# Patient Record
Sex: Male | Born: 1945
Health system: Southern US, Community
[De-identification: ages and names within clinical notes are randomized; demographics above are authoritative.]

## PROBLEM LIST (undated history)

## (undated) DIAGNOSIS — I4892 Unspecified atrial flutter: Secondary | ICD-10-CM

## (undated) DIAGNOSIS — I480 Paroxysmal atrial fibrillation: Secondary | ICD-10-CM

## (undated) DIAGNOSIS — I2510 Atherosclerotic heart disease of native coronary artery without angina pectoris: Secondary | ICD-10-CM

## (undated) DIAGNOSIS — I639 Cerebral infarction, unspecified: Secondary | ICD-10-CM

## (undated) DIAGNOSIS — R011 Cardiac murmur, unspecified: Secondary | ICD-10-CM

## (undated) DIAGNOSIS — Z87442 Personal history of urinary calculi: Secondary | ICD-10-CM

## (undated) DIAGNOSIS — N200 Calculus of kidney: Secondary | ICD-10-CM

## (undated) DIAGNOSIS — I10 Essential (primary) hypertension: Secondary | ICD-10-CM

## (undated) DIAGNOSIS — E785 Hyperlipidemia, unspecified: Secondary | ICD-10-CM

## (undated) DIAGNOSIS — C61 Malignant neoplasm of prostate: Secondary | ICD-10-CM

## (undated) DIAGNOSIS — I499 Cardiac arrhythmia, unspecified: Secondary | ICD-10-CM

## (undated) DIAGNOSIS — M1990 Unspecified osteoarthritis, unspecified site: Secondary | ICD-10-CM

## (undated) HISTORY — PX: COLONOSCOPY: SHX174

## (undated) HISTORY — DX: Unspecified atrial flutter: I48.92

## (undated) HISTORY — DX: Hyperlipidemia, unspecified: E78.5

## (undated) HISTORY — DX: Cerebral infarction, unspecified: I63.9

## (undated) HISTORY — DX: Calculus of kidney: N20.0

## (undated) HISTORY — PX: CATARACT EXTRACTION W/ INTRAOCULAR LENS IMPLANT: SHX1309

## (undated) HISTORY — PX: APPENDECTOMY: SHX54

## (undated) HISTORY — PX: BACK SURGERY: SHX140

## (undated) HISTORY — PX: CERVICAL SPINE SURGERY: SHX589

## (undated) HISTORY — PX: LUMBAR SPINE SURGERY: SHX701

## (undated) HISTORY — PX: EYE SURGERY: SHX253

## (undated) HISTORY — DX: Paroxysmal atrial fibrillation: I48.0

## (undated) HISTORY — DX: Essential (primary) hypertension: I10

## (undated) HISTORY — DX: Malignant neoplasm of prostate: C61

---

## 1965-04-08 HISTORY — PX: APPENDECTOMY: SHX54

## 2000-09-25 ENCOUNTER — Inpatient Hospital Stay (HOSPITAL_COMMUNITY): Admission: RE | Admit: 2000-09-25 | Discharge: 2000-09-26 | Payer: Self-pay | Admitting: Neurological Surgery

## 2000-09-25 ENCOUNTER — Encounter: Payer: Self-pay | Admitting: Neurological Surgery

## 2003-04-09 HISTORY — PX: PROSTATECTOMY: SHX69

## 2003-06-14 ENCOUNTER — Encounter (INDEPENDENT_AMBULATORY_CARE_PROVIDER_SITE_OTHER): Payer: Self-pay | Admitting: *Deleted

## 2003-06-14 ENCOUNTER — Ambulatory Visit (HOSPITAL_COMMUNITY): Admission: RE | Admit: 2003-06-14 | Discharge: 2003-06-14 | Payer: Self-pay | Admitting: Gastroenterology

## 2003-07-04 ENCOUNTER — Encounter: Admission: RE | Admit: 2003-07-04 | Discharge: 2003-07-04 | Payer: Self-pay | Admitting: Gastroenterology

## 2003-07-27 ENCOUNTER — Encounter: Admission: RE | Admit: 2003-07-27 | Discharge: 2003-07-27 | Payer: Self-pay | Admitting: Urology

## 2003-08-25 ENCOUNTER — Inpatient Hospital Stay (HOSPITAL_COMMUNITY): Admission: RE | Admit: 2003-08-25 | Discharge: 2003-08-27 | Payer: Self-pay | Admitting: Urology

## 2003-08-25 ENCOUNTER — Encounter (INDEPENDENT_AMBULATORY_CARE_PROVIDER_SITE_OTHER): Payer: Self-pay | Admitting: Specialist

## 2007-10-06 ENCOUNTER — Emergency Department (HOSPITAL_COMMUNITY): Admission: EM | Admit: 2007-10-06 | Discharge: 2007-10-07 | Payer: Self-pay | Admitting: Family Medicine

## 2008-02-29 ENCOUNTER — Ambulatory Visit: Payer: Self-pay | Admitting: Cardiovascular Disease

## 2008-02-29 ENCOUNTER — Ambulatory Visit: Payer: Self-pay | Admitting: Cardiology

## 2008-02-29 ENCOUNTER — Inpatient Hospital Stay (HOSPITAL_COMMUNITY): Admission: EM | Admit: 2008-02-29 | Discharge: 2008-03-03 | Payer: Self-pay | Admitting: Emergency Medicine

## 2008-03-01 ENCOUNTER — Encounter: Payer: Self-pay | Admitting: Cardiology

## 2008-03-14 ENCOUNTER — Ambulatory Visit: Payer: Self-pay | Admitting: Cardiovascular Disease

## 2008-06-17 ENCOUNTER — Encounter: Payer: Self-pay | Admitting: Cardiovascular Disease

## 2008-06-17 ENCOUNTER — Ambulatory Visit: Payer: Self-pay | Admitting: Cardiovascular Disease

## 2008-06-17 DIAGNOSIS — I4892 Unspecified atrial flutter: Secondary | ICD-10-CM

## 2008-06-27 ENCOUNTER — Ambulatory Visit: Payer: Self-pay | Admitting: Cardiovascular Disease

## 2008-07-25 ENCOUNTER — Encounter: Payer: Self-pay | Admitting: Cardiovascular Disease

## 2008-07-25 ENCOUNTER — Ambulatory Visit: Payer: Self-pay | Admitting: Cardiovascular Disease

## 2008-07-27 ENCOUNTER — Telehealth (INDEPENDENT_AMBULATORY_CARE_PROVIDER_SITE_OTHER): Payer: Self-pay

## 2008-07-28 ENCOUNTER — Ambulatory Visit: Payer: Self-pay

## 2008-07-28 ENCOUNTER — Encounter: Payer: Self-pay | Admitting: Cardiovascular Disease

## 2009-03-09 ENCOUNTER — Ambulatory Visit: Payer: Self-pay | Admitting: Cardiovascular Disease

## 2009-07-10 ENCOUNTER — Ambulatory Visit: Payer: Self-pay | Admitting: Family Medicine

## 2009-07-10 ENCOUNTER — Ambulatory Visit: Payer: Self-pay | Admitting: Cardiology

## 2009-07-10 ENCOUNTER — Inpatient Hospital Stay (HOSPITAL_COMMUNITY): Admission: EM | Admit: 2009-07-10 | Discharge: 2009-07-11 | Payer: Self-pay | Admitting: Emergency Medicine

## 2009-07-11 ENCOUNTER — Encounter: Payer: Self-pay | Admitting: Family Medicine

## 2009-07-14 DIAGNOSIS — I639 Cerebral infarction, unspecified: Secondary | ICD-10-CM

## 2009-07-14 HISTORY — DX: Cerebral infarction, unspecified: I63.9

## 2009-08-01 ENCOUNTER — Ambulatory Visit: Payer: Self-pay | Admitting: Cardiovascular Disease

## 2009-08-02 ENCOUNTER — Encounter: Payer: Self-pay | Admitting: Internal Medicine

## 2009-08-03 ENCOUNTER — Ambulatory Visit: Payer: Self-pay | Admitting: Internal Medicine

## 2009-08-03 DIAGNOSIS — I4891 Unspecified atrial fibrillation: Secondary | ICD-10-CM | POA: Insufficient documentation

## 2009-08-03 DIAGNOSIS — G459 Transient cerebral ischemic attack, unspecified: Secondary | ICD-10-CM

## 2009-08-03 DIAGNOSIS — E785 Hyperlipidemia, unspecified: Secondary | ICD-10-CM

## 2009-08-22 ENCOUNTER — Telehealth: Payer: Self-pay | Admitting: Internal Medicine

## 2009-08-24 ENCOUNTER — Encounter: Payer: Self-pay | Admitting: Physician Assistant

## 2009-08-24 ENCOUNTER — Ambulatory Visit: Payer: Self-pay | Admitting: Internal Medicine

## 2009-08-24 ENCOUNTER — Ambulatory Visit: Payer: Self-pay

## 2009-09-27 ENCOUNTER — Ambulatory Visit: Payer: Self-pay | Admitting: Internal Medicine

## 2010-01-26 ENCOUNTER — Telehealth: Payer: Self-pay | Admitting: Internal Medicine

## 2010-03-09 ENCOUNTER — Encounter: Admission: RE | Admit: 2010-03-09 | Discharge: 2010-03-09 | Payer: Self-pay | Admitting: Gastroenterology

## 2010-03-09 ENCOUNTER — Telehealth: Payer: Self-pay | Admitting: Internal Medicine

## 2010-04-17 ENCOUNTER — Encounter: Payer: Self-pay | Admitting: Internal Medicine

## 2010-04-17 ENCOUNTER — Ambulatory Visit
Admission: RE | Admit: 2010-04-17 | Discharge: 2010-04-17 | Payer: Self-pay | Source: Home / Self Care | Attending: Internal Medicine | Admitting: Internal Medicine

## 2010-05-06 LAB — CONVERTED CEMR LAB
BUN: 18 mg/dL (ref 6–23)
CO2: 21 meq/L (ref 19–32)
Creatinine, Ser: 1.2 mg/dL (ref 0.4–1.5)
GFR calc non Af Amer: 64.34 mL/min (ref >60)
HCT: 49.7 % (ref 39.0–52.0)
Lymphocytes Relative: 11 % — ABNORMAL LOW (ref 12.0–46.0)
MCHC: 33.6 g/dL (ref 30.0–36.0)
Platelets: 203 10*3/uL (ref 150.0–400.0)

## 2010-05-10 NOTE — Assessment & Plan Note (Signed)
Summary: eph/ gd   Visit Type:  Follow-up Primary Provider:  Gilmore Laroche, MD  CC:  dizziness.  History of Present Illness: 65 yo male with history of paroxysmal atrial flutter/fibrillation  here for f/u.  He was diagnosed incidentally in 12/2007 while being worked up for back surgery.  He converted from atrial flutter to NSR in the hospital with Cardizem gtt. Over the last 18 months, he has noticed periods of "irregular" rhythm on his home BP monitor. He has not been aware of irregularity of his heart rhythm but occasionally complained of fluttering for a few seconds. He and I discussed the potential for CVA with paroxysmal atrial arrhythmias, however, his CHADS2 score had been zero. We elected to proceed with ASA 325 mg once daily and Toprol for rate control. His heart rate when in sinus is in the 50s. I had him wear a Holter monitor last year that showed brief runs of atrial flutter/fibrillation. He did not wish to start coumadin. Stress testing without ischemia.   Unfortunately, on 07/10/09, he was admitted to [facility] with TIA symptoms. MRA of neck was normal. MRA of head showed occlusion of posterior sylvian branches of the MCA. MRI of right   brain with and without contrast showed evidence of acute or subacute  ischemia involving the posterior right insular cortex and subcortical  white matter, no mass effect, no definite associated hemorrhage. His symptoms of slurred speech and difficulty with balance resolved by the time he was seen in the ER. Cardiology was not consulted in the hospital. He was started on Pradaxa and discharged home on 07/11/09. Since going home, he has felt well but over the last two days, he has been slightly dizzy and fatigued. His monitor has been reading an irregular pulse. No near syncope or syncope. No chest pain and no real awareness of palpitations. He has had no bleeding on the Pradaxa. Echo in hospital on 07/11/09 with normal LV size and function with moderate  left atrial enlargement and mild right atrial enlargement.   His EKG today shows atrial flutter with HR of 110.   Current Medications (verified): 1)  Toprol Xl 50 Mg Xr24h-Tab (Metoprolol Succinate) .Marland Kitchen.. 1 Tab Once Daily 2)  Lipitor 40 Mg Tabs (Atorvastatin Calcium) .... Take One Tablet By Mouth Daily. 3)  Ultram 50 Mg Tabs (Tramadol Hcl) .Marland Kitchen.. 1 Tab Qam As Needed 4)  Pradaxa 150mg  .... 1 By Mouth Two Times A Day 5)  Omega-3 Krill Oil 300 Mg Caps (Krill Oil) .Marland Kitchen.. 1 By Mouth Daily  Allergies (verified): No Known Drug Allergies  Past History:  Past Medical History: Hyperlipidemia Borderline Hypertension Nephrolithiasis Prostate cancer Paroxysmal atrial flutter/fibrillation Herniated disk at L4-L5 s/p surgery CVA 07/14/09  Social History: Reviewed history from 06/17/2008 and no changes required. No tobacco no alcohol no illicit drugs Married 2 children Employed as Archivist  Review of Systems       The patient complains of fatigue and dizziness.  The patient denies malaise, fever, weight gain/loss, vision loss, decreased hearing, hoarseness, chest pain, palpitations, shortness of breath, prolonged cough, wheezing, sleep apnea, coughing up blood, abdominal pain, blood in stool, nausea, vomiting, diarrhea, heartburn, incontinence, blood in urine, muscle weakness, joint pain, leg swelling, rash, skin lesions, headache, fainting, depression, anxiety, enlarged lymph nodes, easy bruising or bleeding, and environmental allergies.    Vital Signs:  Patient profile:   65 year old male Height:      70 inches Weight:  207 pounds BMI:     29.81 Pulse rate:   110 / minute Resp:     16 per minute BP supine:   90 / 60  (right arm)  Vitals Entered By: Marrion Coy, CNA (August 01, 2009 4:12 PM)  Physical Exam  General:  General: Well developed, well nourished, NAD Musculoskeletal: Muscle strength 5/5 all ext Psychiatric: Mood and affect normal Neck: No JVD, no carotid bruits,  no thyromegaly, no lymphadenopathy. Lungs:Clear bilaterally, no wheezes, rhonci, crackles CV: Irregular.  no murmurs, gallops rubs Abdomen: soft, NT, ND, BS present Extremities: No edema, pulses 2+.    EKG  Procedure date:  08/01/2009  Findings:      Atrial flutter. Non-specific ST and T wave abnormalities.  Impression & Recommendations:  Problem # 1:  ATRIAL FLUTTER (ICD-427.32) Mr. Gatt is having paroxysms of atrial flutter/fibrillation. Recent CVA likely from embolic event. He is now on Pradaxa. He is in atrial flutter today with HR of 110. He is willing to continue Pradaxa at this time. Will check BMET and CBC today. I have discussed his atrial flutter/fibrillation in detail. Treatment options at this time are discussed and include anti-arrhythmic therapy vs ablation. I will ask Dr. Ladona Ridgel to see him and discuss treatment options in more detail. He has normal LV function with enlargement of both atria and mild MR. I will continue Toprol for now at current dose. He is relatively asymptomatic so I do not see a need to hospitalize him today.   The following medications were removed from the medication list:    Aspirin Ec 325 Mg Tbec (Aspirin) .Marland Kitchen... Take one tablet by mouth daily His updated medication list for this problem includes:    Toprol Xl 50 Mg Xr24h-tab (Metoprolol succinate) .Marland Kitchen... 1 tab once daily  Orders: TLB-BMP (Basic Metabolic Panel-BMET) (80048-METABOL) TLB-CBC Platelet - w/Differential (85025-CBCD) EP Referral (Cardiology EP Ref ) EKG w/ Interpretation (93000)  Patient Instructions: 1)  Your physician recommends that you schedule a follow-up appointment in: Already scheduled with Dr. Ladona Ridgel on August 03, 2009 at noon 2)  Your physician recommends that you continue on your current medications as directed. Please refer to the Current Medication list given to you today.

## 2010-05-10 NOTE — Letter (Signed)
Summary: jury duty letter  Selena Batten, Main Office  [address]   [place], [place] [address]   Phone: [phone]  Fax: [phone]    August 03, 2009   Employee:  [employer]    To Whom It May Concern:   For Medical reasons, please excuse the above named patient from jury duty.    If you need additional information, please feel free to contact our office.         Sincerely,    Dr Sharlot Gowda [facility]

## 2010-05-10 NOTE — Assessment & Plan Note (Signed)
Summary: 6 month return.amber   Visit Type:  Follow-up Primary Provider:  Gilmore Laroche, MD   History of Present Illness: [name] returns today for ongoing followup. He has a h/o PAF for which he was started on Flecainide.  The patient notes minimal fatigue  on his meds.  His blood pressure has been well controlled.  No c/p or sob.  He has now had rare palpitations. He notes that his atrial fib is more likely to occur with exertion.  Current Medications (verified): 1)  Toprol Xl 50 Mg Xr24h-Tab (Metoprolol Succinate) .... Take 1/2 Tablet By Mouth Once Daily 2)  Crestor 40 Mg Tabs (Rosuvastatin Calcium) .... Take One-Half Tablet By Mouth Daily. 3)  Ultram 50 Mg Tabs (Tramadol Hcl) .Marland Kitchen.. 1 Tab Qam As Needed 4)  Pradaxa 150 Mg Caps (Dabigatran Etexilate Mesylate) .Marland Kitchen.. 1 Capsule Two Times A Day 5)  Omega-3 Krill Oil 300 Mg Caps (Krill Oil) .Marland Kitchen.. 1 By Mouth Daily 6)  Flecainide Acetate 100 Mg Tabs (Flecainide Acetate) .... One By Mouth Two Times A Day  Allergies (verified): No Known Drug Allergies  Past History:  Past Medical History: Last updated: 08/01/2009 Hyperlipidemia Borderline Hypertension Nephrolithiasis Prostate cancer Paroxysmal atrial flutter/fibrillation Herniated disk at L4-L5 s/p surgery CVA 07/14/09  Past Surgical History: Last updated: 06/17/2008 Prostatectomy in 2005 Appendectomy Back surgery with microdiskectomy at L4-L5  Review of Systems  The patient denies chest pain, syncope, dyspnea on exertion, and peripheral edema.    Vital Signs:  Patient profile:   64 year old male Height:      70 inches Weight:      207 pounds BMI:     29.81 Pulse rate:   53 / minute BP sitting:   130 / 70  (left arm)  Vitals Entered By: Laurance Flatten CMA (April 17, 2010 12:11 PM)  Physical Exam  General:  Well developed, well nourished, in no acute distress.  HEENT: normal Neck: supple. No JVD. Carotids 2+ bilaterally no bruits Cor: RRR no rubs, gallops or murmur Lungs:  CTA Ab: soft, nontender. nondistended. No HSM. Good bowel sounds Ext: warm. no cyanosis, clubbing or edema Neuro: alert and oriented. Grossly nonfocal. affect pleasant    EKG  Procedure date:  04/17/2010  Findings:      Sinus bradycardia with rate of:  53.  Impression & Recommendations:  Problem # 1:  ATRIAL FIBRILLATION (ICD-427.31) His symptoms appear to be well controlled. I have asked him to continue his current meds.  His updated medication list for this problem includes:    Toprol Xl 50 Mg Xr24h-tab (Metoprolol succinate) .Marland Kitchen... Take 1/2 tablet by mouth once daily    Flecainide Acetate 100 Mg Tabs (Flecainide acetate) ..... One by mouth two times a day  Problem # 2:  TIA (ICD-435.9) His symptoms have resolved. I expect he will be on lifelong pradaxa.  Patient Instructions: 1)  Your physician wants you to follow-up in: 12 months with Dr Court Joy will receive a reminder letter in the mail two months in advance. If you don't receive a letter, please call our office to schedule the follow-up appointment. 2)  Your physician recommends that you continue on your current medications as directed. Please refer to the Current Medication list given to you today.

## 2010-05-10 NOTE — Progress Notes (Signed)
Summary: refill request  Phone Note Refill Request Message from:  Patient on January 26, 2010 11:45 AM  Refills Requested: Medication #1:  PRADAXA 150MG  1 by mouth two times a day pt out of town left his rx at home-pls call  [facility] [phone]   Method Requested: Telephone to Pharmacy Initial call taken by: Glynda Jaeger,  January 26, 2010 11:46 AM  Follow-up for Phone Call        Call transferred to me.  Pt in [facility].  Forgot Pradaxa and has been without for 2 days.  Wanted Rx called into pharmacy at [phone].  Rx called in for # 60 with no refills since pharmacy could not split box.  Follow-up by: Judithe Modest CMA,  January 26, 2010 3:54 PM

## 2010-05-10 NOTE — Miscellaneous (Signed)
Summary: ECHO  Clinical Lists Changes  Observations: Added new observation of ECHOINTERP:   Study Conclusions    - Left ventricle: The cavity size was normal. Wall thickness was     increased in a pattern of mild LVH. Systolic function was normal.     The estimated ejection fraction was in the range of 55% to 60%.     Wall motion was normal; there were no regional wall motion     abnormalities.   - Mitral valve: Mild regurgitation.   - Left atrium: The atrium was moderately dilated.   - Right ventricle: The cavity size was mildly dilated.   - Right atrium: The atrium was mildly dilated.   - Pulmonary arteries: Systolic pressure was mildly increased.    --------------------------------------------------------------------   Prepared and Electronically Authenticated by    Olga Millers, MD, [facility]   2011-04-05T16:52:00.347  (07/11/2009 9:13)      Echocardiogram  Procedure date:  07/11/2009  Findings:        Study Conclusions    - Left ventricle: The cavity size was normal. Wall thickness was     increased in a pattern of mild LVH. Systolic function was normal.     The estimated ejection fraction was in the range of 55% to 60%.     Wall motion was normal; there were no regional wall motion     abnormalities.   - Mitral valve: Mild regurgitation.   - Left atrium: The atrium was moderately dilated.   - Right ventricle: The cavity size was mildly dilated.   - Right atrium: The atrium was mildly dilated.   - Pulmonary arteries: Systolic pressure was mildly increased.    --------------------------------------------------------------------   Prepared and Electronically Authenticated by    Olga Millers, MD, [facility]   2011-04-05T16:52:00.347

## 2010-05-10 NOTE — Progress Notes (Signed)
Summary: pt c/o aflutter . at office now, ekg faxing over  Phone Note From Other Clinic Call back at [facility] Phone [phone]   Caller: Allayne Butcher office [phone] Request: Talk with Nurse Summary of Call: pt at office now , c/o aflutter , pt scheduled for gxt on thursday. will be faxing over ekg.  Initial call taken by: Lorne Skeens,  Aug 22, 2009 10:23 AM  Follow-up for Phone Call        Dr Ladona Ridgel has EKG and will call Dennis Bast, RN, BSN  Aug 22, 2009 10:51 AM  Allayne Butcher wants Dr Ladona Ridgel to call her tomorrow in the ofc. [phone]  Aug 22, 2009 2:21 PM  Additional Follow-up for Phone Call Additional follow up Details #1::        Discussed with MD Additional Follow-up by: Laren Boom, MD, [facility],  September 12, 2009 5:02 PM

## 2010-05-10 NOTE — Progress Notes (Signed)
Summary: pt needs medication asap  Phone Note Refill Request Call back at Home Phone [phone] Message from:  Patient  Refills Requested: Medication #1:  PRADAXA 150MG  1 by mouth two times a day Initial call taken by: Omer Jack,  March 09, 2010 11:44 AM    New/Updated Medications: PRADAXA 150 MG CAPS (DABIGATRAN ETEXILATE MESYLATE) 1 capsule two times a day Prescriptions: PRADAXA 150 MG CAPS (DABIGATRAN ETEXILATE MESYLATE) 1 capsule two times a day  #60 x 5   Entered by:   Laurance Flatten CMA   Authorized by:   Laren Boom, MD, [facility]   Signed by:   Laurance Flatten CMA on 03/09/2010   Method used:   Electronically to        [facility]  [phone]* (retail)       [address]       [place], [place]  [address]       Ph: [phone] or [phone]       Fax: [phone]   RxID:   [id number]

## 2010-05-10 NOTE — Assessment & Plan Note (Signed)
Summary: 6- 8 WKS/OK PER KELLY/D.MILLER   Primary Provider:  Gilmore Laroche, MD  CC:  ROV; review GXT test.  History of Present Illness: [name] [name] returns today for ongoing followup.  He was started on Flecainide for his atrial fibrillation and had a treadmill test which demonstrated no arrhythmia.  The patient notes some fatigue  on his meds.  His blood pressure has been well controlled.  No c/p or sob.  He has now had rare palpitations.  Problems Prior to Update: 1)  Dyslipidemia  (ICD-272.4) 2)  Tia  (ICD-435.9) 3)  Atrial Fibrillation  (ICD-427.31) 4)  Atrial Flutter  (ICD-427.32)  Medications Prior to Update: 1)  Toprol Xl 50 Mg Xr24h-Tab (Metoprolol Succinate) .Marland Kitchen.. 1 Tab Once Daily 2)  Lipitor 40 Mg Tabs (Atorvastatin Calcium) .... Take One Tablet By Mouth Daily. 3)  Ultram 50 Mg Tabs (Tramadol Hcl) .Marland Kitchen.. 1 Tab Qam As Needed 4)  Pradaxa 150mg  .... 1 By Mouth Two Times A Day 5)  Omega-3 Krill Oil 300 Mg Caps (Krill Oil) .Marland Kitchen.. 1 By Mouth Daily 6)  Flecainide Acetate 100 Mg Tabs (Flecainide Acetate) .... One By Mouth Two Times A Day  Current Medications (verified): 1)  Toprol Xl 50 Mg Xr24h-Tab (Metoprolol Succinate) .Marland Kitchen.. 1 Tab Once Daily 2)  Crestor 40 Mg Tabs (Rosuvastatin Calcium) .... Take One Tablet By Mouth Daily. 3)  Ultram 50 Mg Tabs (Tramadol Hcl) .Marland Kitchen.. 1 Tab Qam As Needed 4)  Pradaxa 150mg  .... 1 By Mouth Two Times A Day 5)  Omega-3 Krill Oil 300 Mg Caps (Krill Oil) .Marland Kitchen.. 1 By Mouth Daily 6)  Flecainide Acetate 100 Mg Tabs (Flecainide Acetate) .... One By Mouth Two Times A Day  Allergies (verified): No Known Drug Allergies  Past History:  Past Medical History: Last updated: 08/01/2009 Hyperlipidemia Borderline Hypertension Nephrolithiasis Prostate cancer Paroxysmal atrial flutter/fibrillation Herniated disk at L4-L5 s/p surgery CVA 07/14/09  Past Surgical History: Last updated: 07/16/2008 Prostatectomy in 2005 Appendectomy Back surgery with microdiskectomy at  L4-L5  Family History: Last updated: 2008-07-16 Mother died at 50 from ? 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    Vital Signs:  Patient profile:   65 year old male Height:      70 inches Weight:      204 pounds BMI:     29.38 Pulse rate:   50 / minute Pulse rhythm:   regular BP sitting:   106 / 68  (right arm)  Vitals Entered By: [name] [name], [name] [name] (September 27, 2009 2:37 PM)  Physical Exam  General:  Well developed, well nourished, in no acute distress.  HEENT: normal Neck: supple. No JVD. Carotids 2+ bilaterally no bruits Cor: RRR no rubs, gallops or murmur Lungs: CTA Ab: soft, nontender. nondistended. No HSM. Good bowel sounds Ext: warm. no cyanosis, clubbing or edema Neuro: alert and oriented. Grossly nonfocal. affect pleasant    Impression & Recommendations:  Problem # 1:  ATRIAL FIBRILLATION (ICD-427.31) He appears to be maintaining NSR on flecainide.  He has had some fatigue which I think is related to his beta blocker.  I have asked him to  take a half tablet for now.  I will see him back in 6 months. His updated medication list for this problem includes:    Toprol Xl 50 Mg Xr24h-tab (Metoprolol succinate) .Marland Kitchen... Take 1/2 tablet by mouth once daily    Flecainide Acetate 100 Mg Tabs (Flecainide acetate) ..... One by mouth two times a day  Problem # 2:  TIA (ICD-435.9) Because of his past history, I think we will continue his pradaxa for now.  Patient Instructions: 1)  Your physician recommends that you schedule a follow-up appointment in: 6  months with Dr. Sharrell Ku 2)  Your physician has recommended you make the following change in your medication: Decrease Toprol to 1/2 tablet daily.

## 2010-05-10 NOTE — Assessment & Plan Note (Signed)
Summary: a-flutter ok per kelly/sl   Visit Type:  Follow-up Primary Provider:  Gilmore Laroche, MD   History of Present Illness: [name] [name] is referred today by Dr. Clifton James for evaluation of atrial fibrillation and flutter.  The patient is a pleasant middle aged man with a h/o TIA and PAF.  He has symptoms of fatigue, palpitations and weakness.  His blood pressure has been well controlled.  No c/p or sob.  Current Medications (verified): 1)  Toprol Xl 50 Mg Xr24h-Tab (Metoprolol Succinate) .Marland Kitchen.. 1 Tab Once Daily 2)  Lipitor 40 Mg Tabs (Atorvastatin Calcium) .... Take One Tablet By Mouth Daily. 3)  Ultram 50 Mg Tabs (Tramadol Hcl) .Marland Kitchen.. 1 Tab Qam As Needed 4)  Pradaxa 150mg  .... 1 By Mouth Two Times A Day 5)  Omega-3 Krill Oil 300 Mg Caps (Krill Oil) .Marland Kitchen.. 1 By Mouth Daily  Allergies (verified): No Known Drug Allergies  Past History:  Past Medical History: Last updated: 08/01/2009 Hyperlipidemia Borderline Hypertension Nephrolithiasis Prostate cancer Paroxysmal atrial flutter/fibrillation Herniated disk at L4-L5 s/p surgery CVA 07/14/09  Past Surgical History: Last updated: 2008-06-24 Prostatectomy in 2005 Appendectomy Back surgery with microdiskectomy at L4-L5  Family History: Last updated: Jun 24, 2008 Mother died at 43 from ? 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 Mother died at 61 from ? 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  Vital Signs:  Patient profile:   65 year old male Height:      70 inches Weight:      209 pounds BMI:     30.10 Pulse rate:   57 / minute BP sitting:   88 / 84  (left arm)  Vitals Entered By: Laurance Flatten CMA (August 03, 2009 11:55 AM)  Physical Exam  General:  Well developed, well nourished, in no acute distress.  HEENT: normal Neck: supple. No JVD. Carotids 2+ bilaterally no bruits Cor: RRR no rubs, gallops or murmur Lungs: CTA Ab: soft, nontender. nondistended. No HSM. Good bowel sounds Ext: warm. no cyanosis, clubbing or edema Neuro: alert and oriented. Grossly nonfocal. affect pleasant    EKG  Procedure date:  08/03/2009  Findings:      Normal sinus rhythm with rate of:    Impression & Recommendations:  Problem # 1:  ATRIAL FIBRILLATION (ICD-427.31) I have recommended that the patient continue his beta blocker and start flecainide.  I have discussed other treatment options with the patient.  Will schedule a treadmill test to r/o pro-arrhythmia.  His updated medication list for this problem includes:    Toprol Xl 50 Mg Xr24h-tab (Metoprolol succinate) .Marland Kitchen... 1 tab once daily    Flecainide Acetate 100 Mg Tabs (Flecainide acetate) ..... One by mouth two times a day  Problem # 2:  TIA (ICD-435.9) As a result of his atrial fib, I suspect he will require lifelong anti-coagulation.  Problem #  3:  DYSLIPIDEMIA (ICD-272.4) He will continue his lipitor and maintain a low sodium diet. His updated medication list for this problem includes:    Lipitor 40 Mg Tabs (Atorvastatin calcium) .Marland Kitchen... Take one tablet by mouth daily.  Other Orders: EKG w/ Interpretation (93000) Treadmill (Treadmill)  Patient Instructions: 1)  Your physician recommends that you schedule a follow-up appointment in: 6-8 weeks with Dr Ladona Ridgel  2)  Your physician has recommended you  make the following change in your medication: start Flecainide 100mg  two times a day 3)  Your physician has requested that you have an exercise tolerance test.  For further information please visit [email or web address]  Please also follow instruction sheet, as given. Ok for PA to do in next  two weeks Prescriptions: FLECAINIDE ACETATE 100 MG TABS (FLECAINIDE ACETATE) one by mouth two times a day  #60 x 6   Entered by:   Dennis Bast, RN, BSN   Authorized by:   Laren Boom, MD, [facility]   Signed by:   Dennis Bast, RN, BSN on 08/03/2009   Method used:   Electronically to        [facility]  [phone]* (retail)       [address]       [place], [place]  [address]       Ph: [phone] or [phone]       Fax: [phone]   RxID:   [id number]

## 2010-06-27 LAB — CBC
HCT: 42.3 % (ref 39.0–52.0)
HCT: 42.4 % (ref 39.0–52.0)
Hemoglobin: 14.4 g/dL (ref 13.0–17.0)
Hemoglobin: 14.6 g/dL (ref 13.0–17.0)
MCHC: 34 g/dL (ref 30.0–36.0)
MCHC: 34.4 g/dL (ref 30.0–36.0)
MCV: 92.3 fL (ref 78.0–100.0)
RBC: 4.53 MIL/uL (ref 4.22–5.81)
RBC: 4.59 MIL/uL (ref 4.22–5.81)
RDW: 12.8 % (ref 11.5–15.5)
WBC: 5.6 10*3/uL (ref 4.0–10.5)
WBC: 6.3 10*3/uL (ref 4.0–10.5)

## 2010-06-27 LAB — BASIC METABOLIC PANEL
CO2: 25 mEq/L (ref 19–32)
Calcium: 8.4 mg/dL (ref 8.4–10.5)
Chloride: 108 mEq/L (ref 96–112)
Glucose, Bld: 99 mg/dL (ref 70–99)
Potassium: 4.2 mEq/L (ref 3.5–5.1)

## 2010-06-27 LAB — DIFFERENTIAL
Basophils Absolute: 0.1 10*3/uL (ref 0.0–0.1)
Basophils Relative: 1 % (ref 0–1)
Eosinophils Absolute: 0.1 10*3/uL (ref 0.0–0.7)
Lymphs Abs: 1.3 10*3/uL (ref 0.7–4.0)
Monocytes Absolute: 0.7 10*3/uL (ref 0.1–1.0)
Neutrophils Relative %: 65 % (ref 43–77)

## 2010-06-27 LAB — POCT I-STAT, CHEM 8
BUN: 21 mg/dL (ref 6–23)
Chloride: 110 mEq/L (ref 96–112)
Creatinine, Ser: 1 mg/dL (ref 0.4–1.5)
Glucose, Bld: 112 mg/dL — ABNORMAL HIGH (ref 70–99)
Hemoglobin: 15 g/dL (ref 13.0–17.0)

## 2010-06-27 LAB — TSH: TSH: 0.917 u[IU]/mL (ref 0.350–4.500)

## 2010-06-27 LAB — HEMOGLOBIN A1C: Mean Plasma Glucose: 131 mg/dL

## 2010-06-27 LAB — LIPID PANEL
HDL: 21 mg/dL — ABNORMAL LOW (ref >39)
Total CHOL/HDL Ratio: 5.1 RATIO

## 2010-06-27 LAB — POCT CARDIAC MARKERS
CKMB, poc: <1 ng/mL — ABNORMAL LOW (ref 1.0–8.0)
Troponin i, poc: <0.05 ng/mL (ref 0.00–0.09)

## 2010-06-27 LAB — APTT: aPTT: 29 seconds (ref 24–37)

## 2010-06-27 LAB — PROTIME-INR: Prothrombin Time: 15 seconds (ref 11.6–15.2)

## 2010-08-01 ENCOUNTER — Other Ambulatory Visit: Payer: Self-pay | Admitting: Internal Medicine

## 2010-08-21 NOTE — H&P (Signed)
[name] [name], [name] [name]                ACCOUNT NO.:  [id number]   MEDICAL RECORD NO.:  [id number]          PATIENT TYPE:  INP   LOCATION:  3702                         FACILITY:  [facility]   PHYSICIAN:  Darryl D. Prime, MD    DATE OF BIRTH:  1945/05/29   DATE OF ADMISSION:  02/29/2008  DATE OF DISCHARGE:                              HISTORY & PHYSICAL   CODE STATUS:  The patient is full code.   PRIMARY CARE PHYSICIAN:  Dr. Lamar Sprinkles at [facility].   CARDIOLOGIST:  None.   CHIEF COMPLAINT:  Told to come here for increased heart rate.   HISTORY OF PRESENT ILLNESS:  [name] [name] is a 65 year old male with a  history of a sciatic nerve problem, particularly on the right, for many  years and saw his primary care physician today, Dr. Lamar Sprinkles, for these  problems.  He was noted to have an increased heart rate in the range of  150 per his report, and an EKG was done and he was suggested come to the  emergency room here.  He was not open to that suggestion and went home.  Dr. Lamar Sprinkles called him at home and told him again to try to come to the  emergency room, which he did eventually do.  In the emergency room, he  was found to have a heart rate in the 150s and possible atrial flutter  with, on average, 2:1 AV block in the 150 range.  The patient's was  given Diltiazem IV 25 mg and then 10 mg an hour drip.  The patient  denies having any palpitations, chest pain, shortness breath, weakness  or lower extremity edema today or recently.  He did take an aspirin 325  mg x2, Darvocet and Ultram and also some Vicodin for significant right  the hip and posterior leg pain.  He notes recently he may have strained  himself significantly with lifting and may have a bulging disk.  The  patient also took prednisone 60 mg today which was part of a taper, and  in the emergency room was also given 4 mg of morphine and Zofran for his  back pain.   PAST MEDICAL AND SURGICAL HISTORY:  1. He has never had a cardiac  catheterization procedure.  He has never      had an echocardiogram.  2. He has a history of prostate cancer, status post prostatectomy in      2005.  3. History of sciatica.  4. He is status post appendectomy 35 years ago.  5. History of back surgery.  6. History of hyperlipidemia.  7. History of kidney stones.   ALLERGIES:  NO KNOWN DRUG ALLERGIES.   MEDICATIONS:  1. Aspirin 325 mg daily.  2. Lipitor; he is unsure of the dose.  3. Ultram 50 mg p.r.n. for pain.  4. Prednisone 60 mg today, then 40 mg tomorrow, then 20 mg the next      day taper.   SOCIAL HISTORY:  No history of tobacco or alcohol ever.  He works as a  Archivist, self-employed.   FAMILY  HISTORY:  Positive for mother who passed away of heart problems,  not otherwise specified, at an old age.  She also had diabetes.  Father  is living with no heart problems.   REVIEW OF SYSTEMS:  A 14-point review of systems negative unless stated  above including no dysuria, fever, weight loss, sweats, black stools or  bloody stools.   PHYSICAL EXAMINATION:  VITAL SIGNS:  Blood pressure is 143/86,  respiratory rate of 14, pulse initially 137, now 77, temperature of  98.3, saturations are 97% on room air.  GENERAL:  The patient in general is sitting on the edge of the bed on  his left side with his leg on the right extended, in no acute distress.  HEENT:  Normocephalic, atraumatic.  Pupils equal, round and reactive to  light.  Extraocular movements are intact.  The oropharynx reveals no  posterior oropharyngeal lesions.  NECK:  Supple with no lymphadenopathy or thyromegaly.  No carotid  bruits.  No jugular venous distention.  LUNGS:  Clear to auscultation bilaterally.  CARDIOVASCULAR:  Irregularly irregular rhythm with no murmurs, rubs or  gallops.  S1, S2.  ABDOMEN:  Soft, nontender, nondistended with no hepatosplenomegaly.  EXTREMITIES:  No clubbing, cyanosis or edema.  NEUROLOGIC:  He is alert and oriented x4 with has  four with cranial  nerves II to XII grossly intact.  Strength and sensation grossly intact.  There is a suggestion that he may have some short-term memory loss,  however, as it seems that he does forget portions of his medical record  that are unusual for his age.   CHEST X-RAY:  The patient's chest x-ray showed no acute cardiopulmonary  disease.  There is a suggestion of mild increased cardiac silhouette.   ELECTROCARDIOGRAM:  EKG initial shows atrial flutter in the 140s range,  axis being 82, QRS 82, QT corrected 420 ms.  Note - this is  significantly changed compared to EKG of September 23, 2000 which was normal  sinus rhythm at 71 beats per minute.   LABORATORY DATA:  White count of 8.2 with a hemoglobin of 16.2,  hematocrit 48.7, platelets 203.  Sodium of 139 with a potassium of 4.9,  chloride 106, bicarbonate 25, BUN 15 with a creatinine of 0.9, glucose  135.  Cardiac markers at 2135 were negative.   ASSESSMENT/PLAN:  This is a patient with a history of hyperlipidemia,  history of sciatica with recent significant pain who now presents with  atrial flutter with rapid ventricular response, controlled with  Diltiazem.  At this time, he will be admitted for rate control.  We will  get a TSH and get an echocardiogram.  He will be placed on a Diltiazem  drip for rate control for now, and we will place him on a heparin drip  pending evaluation of ejection fraction and a full [place] score  calculation.  For DVT prophylaxis, he will be on heparin drip.  GI  prophylaxis with Zantac.      Darryl D. Prime, MD  Electronically Signed     DDP/MEDQ  D:  02/29/2008  T:  03/01/2008  Job:  [id number]

## 2010-08-21 NOTE — Discharge Summary (Signed)
NAMEGIOVANNY, [name] [name] NO.:  [id number]   MEDICAL RECORD NO.:  [id number]          PATIENT TYPE:  INP   LOCATION:  3017                         FACILITY:  [facility]   PHYSICIAN:  Stefani Dama, M.D.  DATE OF BIRTH:  31-Oct-1945   DATE OF ADMISSION:  02/29/2008  DATE OF DISCHARGE:  03/03/2008                               DISCHARGE SUMMARY   ADMITTING DIAGNOSES:  1. Herniated nucleus pulposus at L4-L5 with right lumbar      radiculopathy.  2. Atrial fibrillation, atrial flutter.   DISCHARGE AND FINAL DIAGNOSES:  1. Atrial fibrillation, atrial flutter.  2. Herniated nucleus pulposus at L4-L5 with right lumbar      radiculopathy.   CONDITION ON DISCHARGE:  Improving.   HOSPITAL COURSE:  [name] [name] is a 65 year old individual who this  past Sunday developed a sudden and severe onset of excruciating back and  right lower extremity pain.  He had evidence of herniated nucleus  pulposus at L4-L5.  However, his hospitalization was initially because  of noted rapid heart rate of 150-180 when he presented to his physician  on Monday for evaluation of the back and right leg pain.  It was noted  that he was in atrial fibrillation and atrial flutter and he was  hospitalized by [facility] cardiologist.  He was started on medication in  the form of a Cardizem drip and he rapidly converted.  He was worked up  cardiac wise and a thyroid-stimulating hormone was checked, which was  noted to be normal.  He was placed on heparin during the hospitalization  and when he was noted to be converted for period of 24 hours, heparin  was discontinued.  The patient was seen in consultation by Dr. Jodi Geralds who suggested an MRI of the lumbar spine.  I was then asked to  see the patient both by Dr. Luiz Blare and his wife who has known me from  previous care of [name] [name].  The disk herniation was noted on MRI  performed on March 02, 2008 and because he was in rather severe and  excruciating pain, I advised surgical extirpation of the disk which was  performed on March 02, 2008.  Postoperatively, the patient had good  and immediate relief of his back and leg pain.  His incision has been  clean and dry.  He has been advised as to his postoperative activities.  He will be seen in the office in about 2 weeks' time.  He was given a  prescription for Percocet #40 without refills, Valium 5 mg #30 without  refills.  He will be seen by his cardiologist also with a followup  visit.  Condition on discharge is improving.  The patient also has been  started on metoprolol 12.5 mg p.o. b.i.d. for control of his heart rate.      Stefani Dama, M.D.  Electronically Signed     HJE/MEDQ  D:  03/03/2008  T:  03/03/2008  Job:  [id number]

## 2010-08-21 NOTE — Op Note (Signed)
NAMEDAKING, WESTERVELT NO.:  [id number]   MEDICAL RECORD NO.:  [id number]          PATIENT TYPE:  INP   LOCATION:  3017                         FACILITY:  [facility]   PHYSICIAN:  Stefani Dama, M.D.  DATE OF BIRTH:  1945-10-19   DATE OF PROCEDURE:  03/02/2008  DATE OF DISCHARGE:                               OPERATIVE REPORT   PREOPERATIVE DIAGNOSIS:  Herniated nucleus pulposus, L4-L5, right with  right lumbar radiculopathy.   POSTOPERATIVE DIAGNOSIS:  Herniated nucleus pulposus, L4-L5, right with  right lumbar radiculopathy.   PROCEDURE:  Microdiskectomy, L4-L5, right with operating microscope,  microdissection technique.   SURGEON:  Stefani Dama, MD   ANESTHESIA:  General endotracheal.   INDICATIONS:  Cace Osorto is a 65 year old individual who developed an  acute onset of severe back and right lower extremity pain this past  Sunday.  He had back pain for a number of months and in fact I had seen  him for this problem of back pain years ago.  The pain was so severe, he  presented to his primary care physician, who noted that the patient had  a heart rate of 150-180, which is very irregular, and was found to be in  atrial flutter, he was hospitalized for this.  He converted  spontaneously with some medications; however, his pain was very  difficult to control.  Today, we were able to obtain an MRI of lumbar  spine, which showed a herniated nucleus pulposus at L4-L5 on the right  side.  He is now taken to the operating room.   PROCEDURE:  The patient was brought to the operating room, supine on the  stretcher.  After smooth induction of general endotracheal anesthesia,  he was turned prone onto the operating table.  Back was prepped with  alcohol and DuraPrep and draped in a sterile fashion.  Midline incision  was created and carried down to the lumbodorsal fascia, which was opened  on the right side.  The interlaminar space was dissected in the  subperiosteal fashion to expose the interlaminar outer layer of yellow  ligament at L4-L5.  This was localized positively with a radiograph.  Then, with a self-retaining retractor in place, a laminotomy was created  removing the inferior margin lamina of L4 out of the medial wall of the  facet, and also removing a portion of the superior arch of L5.  Common  dural tube was identified after taking up the yellow ligament and in the  lateral aspect, the L5 nerve root was noted be severely crushed against  the lateral aspect of the laminar arch of L5 with a significant amount  of disk material underneath it.  By gradually dissecting with a  microdissection technique, the L5 nerve root could be mobilized  medially, and the disk fragments could be removed.  This allowed for  good and immediate decompression of the disk space.  Disk fragments,  however, noted to extrude from the L4-L5 disk space.  The disk space  itself was noted to be severely degenerated.  A thorough diskectomy was  performed of  the disk space itself, removing disk from both medially and  laterally and particularly under the subligamentous region.  With this  decompression being performed in a very piecemeal and timely fashion,  ultimately the common dural tube and the nerve root was well  decompressed.  Hemostasis in all of the soft tissues was obtained  meticulously and then after thorough inspection along the path of the  nerve root and in the disk space itself, no other fragments of this  could be found.  The retractor was removed.  The microscope was  removed.  The lumbodorsal fascia was closed with #1 Vicryl in  interrupted fashion, 2-0 Vicryl was used in the subcutaneous tissues, 3-  0 Vicryl subcuticularly, and Dermabond was placed on the skin.  Blood  loss for the procedure was estimated less than 100 mL.  The patient  tolerated the procedure well.      Stefani Dama, M.D.  Electronically Signed     HJE/MEDQ   D:  03/02/2008  T:  03/03/2008  Job:  [id number]

## 2010-08-21 NOTE — Consult Note (Signed)
NAMEGARRICK, MIDGLEY NO.:  [id number]   MEDICAL RECORD NO.:  [id number]          PATIENT TYPE:  INP   LOCATION:  3017                         FACILITY:  [facility]   PHYSICIAN:  Stefani Dama, M.D.  DATE OF BIRTH:  Jun 14, 1945   DATE OF CONSULTATION:  03/02/2008  DATE OF DISCHARGE:                                 CONSULTATION   REQUESTING PHYSICIAN:  Harvie Junior, MD   REASON FOR REQUEST:  Herniated nucleus pulposus.   HISTORY OF PRESENT ILLNESS:  [name] [name] is a 65 year old right-handed  individual who has had significant episodes of back pain in the past.  In fact, I had seen him about 10 years ago for episodic back pain  related to bulging disk at L4-L5.  He tells me that about 4-5 days ago,  he developed severe acute pain in the buttock and right lower extremity.  This pain was unrelenting.  He was seen by his primary care physician  who noted that the patient had a rapid irregular heart rate.  He was  hospitalized for a new diagnosis of atrial flutter, been started on  heparin anticoagulation but he converted spontaneously with the use of  some beta blockers and other medications.  The patient is now seen for  further evaluation of an acute right lumbar radiculopathy and has been  unrelenting despite bed rest and efforts at conservative management here  in the hospital.  An MRI has been performed at this time and the study  reveals the presence of extruded fragment disk at L4-L5 just under the  L5 nerve root.  Disk herniation itself was not large but it is right in  the region of the foramen elevating and compressing the L5 nerve root.   PHYSICAL EXAMINATION:  GENERAL:  The patient is alert and oriented.  HEENT:  His pupils are 4 mm, brisk, reactive to light and accommodation.  The extraocular movements are full.  Face is symmetric to grimace.  Tongue and uvula are in the midline.  Sclerae and conjunctivae are  clear.  Sensation about the face is intact.   Motor strength in the upper  and lower extremities is normal save for his low right lower extremity  where testing the tibialis anterior suggest some weakness in the  dorsiflexor function.  His iliopsoas and quads appears intact.  Straight  leg raising is markedly positive at 15 degrees.  Patrick maneuver is  negative on either side.  Straight leg raising on the left side is  positive at 45 degrees for right leg pain.  There is a severe amount of  spasm in the lower lumbar spine.  Palpation and percussion in the  lateral aspect of the spine reproduces pain, also radiating to the right  buttock and leg.   IMPRESSION:  At the current time, it appears the patient has an acute  herniation of the disk at L4-L5.  He will be scheduled for surgical  extirpation  this evening as it seems like he is not responding well to  conservative efforts and it would not be advisable that he  tolerate  steroids at this time as this could aggravate his cardiac arrhythmia.      Stefani Dama, M.D.  Electronically Signed     HJE/MEDQ  D:  03/02/2008  T:  03/03/2008  Job:  [id number]

## 2010-08-21 NOTE — Assessment & Plan Note (Signed)
[facility]                            CARDIOLOGY OFFICE NOTE   METRO, EDENFIELD                       MRN:          [id number]  DATE:03/14/2008                            DOB:          [birth year]    PRIMARY CARE PHYSICIAN:  [name] John T. Pamalee Leyden, MD   REASON FOR VISIT:  The patient seen in consultation in the hospital with  atrial flutter at the time of herniated disk requiring surgical repair  and is here today to establish cardiology care.   HISTORY OF PRESENT ILLNESS:  [name] [name] is a pleasant 65 year old  Caucasian male with a past medical history significant for prostate  cancer, hyperlipidemia, nephrolithiasis, and back pain, who is admitted  to the [facility] on February 29, 2008, after he presented to  his primary care physician with complaints of acute onset of lower back  pain and was found to have a heart rate of 150-180.  Upon admission to  the hospital, the patient was felt to have atrial flutter versus atrial  fibrillation and was started on intravenous diltiazem with rapid  conversion to normal sinus rhythm.  The patient had no recurrence of any  atrial arrhythmias during the rest of the hospitalization.  Because of  his severe back pain, Neurosurgery was consulted and performed an MRI of  the lower back, which showed a herniated disk at L4-L5.  The patient  went for a microdiskectomy on March 02, 2008.  He was discharged to  home on March 03, 2008, with complete resolution of his back pain.  Recommendations were made at the time of discharge for a full-strength  aspirin for antiplatelet therapy as the patient's CHADS Score was zero.   The patient tells me that he has done well since being discharged home.  He denies any episodes of palpitations, chest pain, dyspnea, dizziness,  near syncope, syncope, diaphoresis, orthopnea, PND, or lower extremity  edema.  He has not been aware of any irregularities of his heart  rhythm.  Of note, he was not aware of his rapid heart rate or irregular rhythm at  the time of admission to the hospital either.   PAST MEDICAL HISTORY:  1. Prostate cancer.  2. Hyperlipidemia.  3. Nephrolithiasis.  4. Paroxysmal atrial flutter.  5. Herniated disk at L4-L5.   PAST SURGICAL HISTORY:  1. Prostatectomy in 2005.  2. Appendectomy.  3. Recent back surgery with microdiskectomy at L4-L5.   ALLERGIES:  No known drug allergies.   CURRENT MEDICATIONS:  1. Enteric-coated aspirin 325 mg once daily.  2. Toprol-XL 25 mg once daily.  3. Lipitor 20 mg once daily.  4. Ultram 1 tablet once daily.  5. Glucosamine 1 tablet once daily.  6. Omega-3, 1 tablet once daily.   SOCIAL HISTORY:  The patient denies use of tobacco, alcohol, or illicit  drugs.  He is married, has 2 children, and is employed as a Scientist, clinical (histocompatibility and immunogenetics).   FAMILY HISTORY:  The patient's mother died at age of 44 from probable  heart problems, although he is unsure what ultimately caused her death.  His father is alive and has prostate cancer.  He has 3 sisters and 1  brother that are alive and healthy.  He has 1 sister that died from  breast cancer.  Another brother that died from testicular cancer.   REVIEW OF SYSTEMS:  As stated in the history of present illness is  otherwise negative.   PHYSICAL EXAMINATION:  VITALS:  Blood pressure 96/72, pulse 64 and  regular, respirations 12 and unlabored.  GENERAL:  He is a pleasant middle-aged Caucasian male in no acute  distress.  He is alert and oriented x3.  Psychiatric mood and affect are  normal.  NEUROLOGICAL:  No focal neurological deficits.  MUSCULOSKELETAL:  Muscle strength and tone is normal.  SKIN:  Warm and dry.  HEENT:  Normal.  NECK:  No JVD.  No carotid bruits.  No thyromegaly.  No lymphadenopathy.  LUNGS:  Clear to auscultation bilaterally without wheezes, rhonchi, or  crackles noted.  CARDIOVASCULAR:  Regular rate and rhythm without murmurs, gallops,  or  rubs noted.  ABDOMEN:  Soft, nontender, nondistended.  Bowel sounds are present.  EXTREMITIES:  No evidence of edema.  Pulses are 2+ in lower extremities.   DIAGNOSTIC STUDIES:  1. A 2-D echocardiogram performed on March 01, 2008, shows overall      left ventricular systolic function as normal with an ejection      fraction of 60%.  Left ventricular wall thickness was at the upper      limits of normal.  Aortic valve was mildly calcified.  There was no      evidence of aortic stenosis.  There was mild ascending aortic      dilatation with the root measuring 40.5 mm.  The left atrium was      noted to be mildly dilated.  2. Laboratory values from recent hospitalization show a hemoglobin of      11.8, platelet count 196.  D-dimer 0.39.  Potassium 4.9, creatinine      1.15.  Troponin 0.01 x3.  TSH 1.92.  3. A 12-lead EKG obtained in our office today shows normal sinus      rhythm with a ventricular rate of 64 beats per minute and no      ischemic changes.   ASSESSMENT AND PLAN:  This is a pleasant 65 year old Caucasian male with  a recent episode of atrial flutter at the time of a severe herniated  back injury.  The patient was hospitalized and had rapid conversion to  normal sinus rhythm with intravenous diltiazem.  His CHADS Score is 0.  Because of this, he was discharged on full-strength aspirin and Toprol-  XL 25 mg once daily.  The patient has done well since the time of  discharge.  He has had no awareness of irregularities of his heart  rhythm.  I think it would be reasonable to continue his current medical  therapy.  I have discussed the physiology of atrial fibrillation and  atrial flutter with the patient.  He is at a low risk for stroke given  his normal LV function, age less than 42, lack of hypertension, lack of  diabetes, and lack of prior stroke.  I would like to see him back in  this office in 6 months.  We will plan on repeating an echocardiogram in  1 year to  follow the size of his aortic root as well as to look at his  LVH.  The patient is aware that he should call our  office if he has any  awareness of irregularities of his heart rhythm.  If he has recurrence  of his atrial arrythmia, I would consider referral to one of our EP  specialists.     [name] Carrow, MD  Electronically Signed    CM/MedQ  DD: 03/14/2008  DT: 03/15/2008  Job #: [id number]   cc:   [name] John T. Pamalee Leyden, MD

## 2010-08-24 NOTE — Op Note (Signed)
[place]  Patient:    [name] [name], [name] [name]                       MRN: [id number] Proc. Date: 09/25/00 Adm. Date:  [id number] Attending:  Jonne Ply                           Operative Report  PREOPERATIVE DIAGNOSIS:  Cervical spondylosis with right cervical radiculopathy C5-6 and C6-7.  POSTOPERATIVE DIAGNOSIS:  Cervical spondylosis with right cervical radiculopathy C5-6 and C6-7.  OPERATION PERFORMED:  Anterior cervical diskectomy, C5-6 and C6-7, structural allograft, Synthes plate fixation.  SURGEON:  Stefani Dama, M.D.  ANESTHESIA:  General endotracheal.  INDICATIONS FOR PROCEDURE:  The patient is a 65 year old individual who has had significant neck, shoulder and right arm pain.  He has a C7 radiculopathy. He has profound spondylitic changes with chronic disk rupture at C6-7 eccentric to the right side.  He has been advised regarding surgical intervention.  DESCRIPTION OF PROCEDURE:  The patient was brought to the operating room supine on the stretcher.  After smooth induction of general endotracheal anesthesia, he was placed in five pounds of halter traction.  The neck was prepped with DuraPrep and draped in sterile fashion.  A transverse incision was created on the left side of the neck and carried down to the platysma. The plane between the sternocleidomastoid and the strap muscles was dissected bluntly until the prevertebral space was reached.  The first identifiable disk space was noted to be that of C4-5 on a radiograph and dissection was carried down at C5-6 and C6-7 to expose the ventral aspect of these disks.  A diskectomy was then performed after Caspar retractor was placed under the longus colli muscle first at C5-6.  The posterior longitudinal ligament was reached and this was opened.   On the right side there was noted to be significant osteophytic spurring from the inferior margin of the body of C5 out to the  lateral recess.  An uncinate spur was also removed.  In the end, the area was decompressed from left to right and a 7 mm tricortical bone graft was placed into the interspace with the cortex facing dorsally.  The dissection was then carried inferiorly to expose C6-7 and a similar diskectomy was performed.  A large osteophyte was encountered off to the right side with some degenerated disk material in the subligamentous space.  The ligament was opened completely.  The lateral recess was decompressed on both sides and in the end, hemostasis was achieved and a 7 mm tricortical bone graft was placed in the interspace facing dorsally.  Hemostasis in the soft tissues was achieved and then a 40 mm Synthes plate was contoured and fit over the vertebral bodies of C5, C6 and C7 with four locking 4 x 14 mm screws.  The area was then checked for hemostasis and the platysma was reflected into its original position and closed with 3-0 Vicryl in interrupted fashion.  3-0 Vicryl was used subcuticularly.  Localizing radiograph identified good position of the fixation.  The area was then dressed with a piece of Tegaderm over Telfa.  The patient tolerated the procedure well and was returned to the recovery room in stable condition. DD:  09/25/00 TD:  09/25/00 Job: [id number]

## 2010-08-24 NOTE — Op Note (Signed)
NAME:  [name] [name], [name] [name]                          ACCOUNT NO.:  [id number]   MEDICAL RECORD NO.:  [id number]                   PATIENT TYPE:  AMB   LOCATION:  ENDO                                 FACILITY:  [facility]   PHYSICIAN:  Graylin Shiver, M.D.                DATE OF BIRTH:  [birth year]   DATE OF PROCEDURE:  06/14/2003  DATE OF DISCHARGE:                                 OPERATIVE REPORT   PROCEDURE PERFORMED:  Incomplete colonoscopy with biopsy.   INDICATIONS FOR PROCEDURE:  Screening.   Informed consent was obtained after explanation of the risks of bleeding,  infection, and perforation.   PREMEDICATIONS:  Fentanyl 100 mcg  IV, Versed 10 mg IV.   DESCRIPTION OF PROCEDURE:  With the patient in the left lateral decubitus  position, a rectal exam was performed and no masses were felt.  The Olympus  colonoscope was inserted into the rectum and advanced around a tortuous  colon to the region of the hepatic flexure.  The light could be seen up in  the right upper quadrant.  Despite all maneuvers with applying pressure to  various points on the abdomen, changing of position of the patient such as  placing him on his back, and on his right side, I could not advance the  scope beyond the region of the hepatic flexure.  The scope was brought out.  No abnormalities were seen in the transverse colon.  There was a small 2 to  3 mm polyp in the descending colon that was biopsied off.  The sigmoid and  rectum looked normal.  The patient tolerated the procedure well without  complications.   IMPRESSION:  Small polyp in the ascending colon.   PLAN:  I will recommend that we do an air contrast barium enema in a few  weeks to evaluate the more proximal colon since the scope could not be  advanced beyond the region of the hepatic flexure.                                               Graylin Shiver, M.D.    [name] [name]  D:  06/14/2003  T:  06/14/2003  Job:  [id number]   cc:   Ernestina Penna,  M.D.  [address] [place]  [place] [address]  Fax: [phone]

## 2010-08-24 NOTE — Op Note (Signed)
NAME:  [name] [name], [name] [name]                          ACCOUNT NO.:  [id number]   MEDICAL RECORD NO.:  [id number]                   PATIENT TYPE:  INP   LOCATION:  0383                                 FACILITY:  [facility]   PHYSICIAN:  Excell Seltzer. Annabell Howells, M.D.                 DATE OF BIRTH:  10-15-1945   DATE OF PROCEDURE:  08/25/2003  DATE OF DISCHARGE:                                 OPERATIVE REPORT   PROCEDURES:  Radical retropubic prostatectomy with pelvic lymphadenectomy   PREOPERATIVE DIAGNOSIS:  Gleason 7, 4 + 3 adenocarcinoma of the prostate.   POSTOPERATIVE DIAGNOSIS:  Gleason 7, 4 + 3 adenocarcinoma of the prostate.   SURGEON:  Dr. Bjorn Pippin   ASSISTANT:  Dr. Bailey Mech   ANESTHESIA:  General.   SPECIMENS:  Prostate seminal vesicles and bilateral pelvic nodes.   DRAINS:  A Foley catheter and Blake drain.   COMPLICATIONS:  None.   INDICATIONS:  [name] [name] is a 65 year old, white male, who was found to have a  stage T2 A Gleason 7, 4 + 3 adenocarcinoma of the prostate.  He has elected  radical prostatectomy.   FINDINGS AND PROCEDURE:  The patient was taken to the operating room.  He  received 1 g of Ancef.  He was fitted with thigh TED and PAS hose.  A  general anesthetic was induced.  A bump was placed under his pelvis.  His  lower abdomen was shaved.  He was prepped with Betadine solution and draped  in the usual sterile fashion.  A lower midline incision was made with a  knife.  This was carried down through the anterior rectus fascia.  The  rectus muscles were parted in the midline.  Transversalis fascia was  incised.  The right and left pelvic fossa were exposed, and retractors were  placed, exposing the right pelvic fossa.  A Foley catheter was inserted, and  the bladder was drained.  The right node dissection was then performed with  the limit of dissections being the iliac vein, the obturator nerve, the  circumflex iliac vein, and the bifurcation of the iliac artery.   Hem-o-lok  clips were used to control vascular and lymphatic channels.  No obvious  nodal disease was noted.  The left-sided node was then performed in  identical fashion.  Once again, without evidence of gross nodal disease.  The retractors were then repositioned.  The endopelvic fascia was entered  adjacent to the prostate and opened widely and then incised in a  posterolateral fashion with the Bovie.  An Allis clamp was then used to  grasp the edges of the endopelvic fascia, and a back-bleeding stitch was  placed using a figure-of-eight 2-0 Vicryl at the bladder neck.  The  puboprostatic ligaments were taken down to Bovie and finger dissection.  The  Hohenfellner clamp was placed beneath the dorsal vein complex, and a 0  Vicryl tie was placed.  The dorsal vein complex was then divided, exposing  the anterior urethra.  A Vanderbilt was then used to dissect the  neurovascular bundle off the urethra on each side, and a moistened umbilical  tape was placed beneath the urethra.  The urethra was then divided  anteriorly.  The Foley catheter was then pulled from the urethra, divided,  and used to provide cephalad traction.  The posterior urethra was then  divided.  The rectourethralis attachments were taken down using sharp and  blunt dissection, and the prostate was then dissected off the anterior  rectal wall with finger dissection.  The lateral pedicles were taken down  using right-angle clamp and right-angle clips.  Once the prostate was  reflected cephalad, the anterior leaf of Denonvillier's fascia was incised  over the seminal vesicles.  The ampulla of vas were dissected out and  divided after being clipped with large Hem-o-lok clips.  The seminal  vesicles were dissected out, clamped with Hem-o-lok clips, and divided.  We  then turned our attention anteriorly where the bladder neck was grasped  between Allis clamps and opened with the Bovie.  Once the bladder neck had  been divided,  the Foley balloon was deflated, and the Foley was used to  provide traction on the prostate.  The patient was given indigo carmine, and  the ureteral orifices were identified well away from the bladder neck.  The  posterior aspect of the prostate was dissected off the bladder neck, and the  prostate specimen was removed.  The bladder neck mucosa was then everted  with 4-0 Vicryls, and the bladder neck was reconstructed in a tennis racquet  fashion with a running 2-0 chromic stitch.  Once the bladder neck  reconstruction had been completed and hemostasis was assured in the pelvis,  anastomotic sutures were placed at the 2, 5, 7, 10, and 12 o'clock positions  using 2-0 Vicryl.  A fresh 20 French Foley catheter was then inserted, and  the anastomotic sutures were then placed across the bladder neck.  The Foley  was inserted in the bladder, and the final 12 o'clock anastomotic suture was  placed.  Once all the sutures were in position, the retractors were  loosened, and the bladder neck was then brought down to the urethral stump,  and the anastomotic sutures were tied.  The bladder was then irrigated, and  the anastomosis was found to be watertight.  At this point, a #10 flat fully-  fluted Blake drain was brought through a separate stab wound on the left  side of the incision.  The anterior rectus fascia was closed with a running  #1 PDS.  Subcutaneous tissue was irrigated, and the skin was closed with  skin clips.  The sponge, needle, and instrument counts were correct.  The  Foley catheter was placed to straight drainage and secured to the patient's  leg.  A dressing was applied to the wound.  His anesthetic was reversed.  He  was taken down from lithotomy position, and he was moved to the recovery  room in stable condition.  There were no complications.                                               Excell Seltzer. Annabell Howells, M.D.   JJW/MEDQ  D:  08/25/2003  T:  08/25/2003  Job:  [id number]   cc:    Ernestina Penna, M.D.  [address] [place]  [place] [address]  Fax: [phone]

## 2010-09-05 ENCOUNTER — Other Ambulatory Visit: Payer: Self-pay | Admitting: Internal Medicine

## 2010-09-24 ENCOUNTER — Telehealth: Payer: Self-pay | Admitting: Internal Medicine

## 2010-09-24 DIAGNOSIS — I4892 Unspecified atrial flutter: Secondary | ICD-10-CM

## 2010-09-24 NOTE — Telephone Encounter (Signed)
This morning he says he was out of rhythm  HR-70's-80's  Was wondering if could increase his Flecainide more if he stays out of rhythm  Told him I would discuss with Dr Ladona Ridgel and call him back tomorrow

## 2010-09-24 NOTE — Telephone Encounter (Signed)
Pt has question re his meds. Pt wants to talk to dr Ladona Ridgel nurse. Pt states when nurse call to please ask for budd.

## 2010-09-25 NOTE — Telephone Encounter (Signed)
Patient came in for an EKG  He is in flutter with a rate of 101  Showed to Dr Ladona Ridgel and he advised him to increase his Flecainide to 150mg  bid and to come back in one week for an EKK and trough Flecainide level  Patient is coming for both on 10/02/10 at 8:30

## 2010-09-25 NOTE — Telephone Encounter (Signed)
New message today from patient.  [phone].  Heart rate is up 2 times the normal which is even more then yesterday.

## 2010-09-25 NOTE — Telephone Encounter (Signed)
[phone] HEART RATE IS TWICE AS FAST AS IT SHOULD BE

## 2010-09-25 NOTE — Telephone Encounter (Signed)
Spoke with patient his HR is 105  I have discussed with Dr Ladona Ridgel and he says it is okay for patient to increase his Flecainide to 150mg  bid  If he does this will need to come back I one week for an EKG and trough Flecainide level.  Discussed with patient and he also questions if he should increase his Toprol back to 100mg  daily  Let patient know I will ask Dr Ladona Ridgel and call him back.  He asked that I not call for at least 

## 2010-10-02 ENCOUNTER — Encounter (INDEPENDENT_AMBULATORY_CARE_PROVIDER_SITE_OTHER): Payer: Medicare Other

## 2010-10-02 ENCOUNTER — Other Ambulatory Visit (INDEPENDENT_AMBULATORY_CARE_PROVIDER_SITE_OTHER): Payer: Medicare Other | Admitting: *Deleted

## 2010-10-02 DIAGNOSIS — I4892 Unspecified atrial flutter: Secondary | ICD-10-CM

## 2010-10-02 DIAGNOSIS — R0989 Other specified symptoms and signs involving the circulatory and respiratory systems: Secondary | ICD-10-CM

## 2010-10-08 ENCOUNTER — Telehealth: Payer: Self-pay | Admitting: *Deleted

## 2010-10-08 LAB — FLECAINIDE LEVEL

## 2010-10-08 NOTE — Telephone Encounter (Signed)
LEFT MESSAGE RE RECEIVED CALL FROM LAB  ([facility])PT  NEEDED FLECAINIDE LEVEL DONE PER LAB NOT ENOUGH SPECIMAN TO RUN .PT NEEDS TO RETURN FOR REPEAT LAB DRAW./CY

## 2010-10-09 NOTE — Telephone Encounter (Signed)
Pt would like to have flecainide level redrawn at Dr. Caren Macadam office today. They will fax [place] the results [phone]  Mylo Red RN

## 2010-10-09 NOTE — Telephone Encounter (Signed)
Pt returning call from [facility].

## 2010-10-15 ENCOUNTER — Encounter: Payer: Self-pay | Admitting: Internal Medicine

## 2010-10-17 ENCOUNTER — Encounter: Payer: Self-pay | Admitting: Internal Medicine

## 2010-10-18 ENCOUNTER — Encounter: Payer: Self-pay | Admitting: *Deleted

## 2010-10-18 ENCOUNTER — Encounter: Payer: Self-pay | Admitting: Internal Medicine

## 2010-10-18 ENCOUNTER — Ambulatory Visit (INDEPENDENT_AMBULATORY_CARE_PROVIDER_SITE_OTHER): Payer: Medicare Other | Admitting: Internal Medicine

## 2010-10-18 VITALS — BP 112/96 | HR 93 | Resp 18 | Ht 70.0 in | Wt 212.8 lb

## 2010-10-18 DIAGNOSIS — I4891 Unspecified atrial fibrillation: Secondary | ICD-10-CM

## 2010-10-18 DIAGNOSIS — I4892 Unspecified atrial flutter: Secondary | ICD-10-CM

## 2010-10-18 NOTE — Patient Instructions (Signed)

## 2010-10-20 ENCOUNTER — Encounter: Payer: Self-pay | Admitting: Internal Medicine

## 2010-10-20 NOTE — Progress Notes (Signed)
HPI [name] returns today for followup. He is a pleasant 65 yo man with a h/o atrial fibrillation. He has been on flecainide but has recently developed atrial flutter. His rate is not been particularly fast at rest. He has been on pradaxa for anti-coagulation. He denies c/p. He has minimal palpitations. No syncope. No Known Allergies   Current Outpatient Prescriptions  Medication Sig Dispense Refill  . flecainide (TAMBOCOR) 100 MG tablet        . metoprolol (TOPROL-XL) 50 MG 24 hr tablet Take by mouth. Take 1/2 tablet daily       . OMEGA-3 KRILL OIL 300 MG CAPS Take 1 capsule by mouth daily.        Marland Kitchen PRADAXA 150 MG CAPS TAKE ONE CAPSULE BY MOUTH TWICE DAILY  60 capsule  6  . rosuvastatin (CRESTOR) 40 MG tablet Take 20 mg by mouth daily.        . traMADol (ULTRAM) 50 MG tablet Take 50 mg by mouth every 6 (six) hours as needed.           Past Medical History  Diagnosis Date  . Hyperlipidemia   . Hypertension   . Nephrolithiasis   . Prostate cancer   . Paroxysmal atrial flutter   . Atrial fibrillation   . CVA (cerebral infarction) 07-14-09    ROS:   All systems reviewed and negative except as noted in the HPI.   Past Surgical History  Procedure Date  . Prostatectomy 2005  . Back surgery   . Appendectomy      Family History  Problem Relation Age of Onset  . Heart disease Mother 79    died  . Prostate cancer Father     alive  . Testicular cancer Brother     died     History   Social History  . Marital Status: Married    Spouse Name: N/A    Number of Children: 2  . Years of Education: N/A   Occupational History  . Archivist    Social History Main Topics  . Smoking status: Never Smoker   . Smokeless tobacco: Not on file  . Alcohol Use: No  . Drug Use: No  . Sexually Active: Not on file   Other Topics Concern  . Not on file   Social History Narrative  . No narrative on file     BP 112/96  Pulse 93  Resp 18  Ht 5\' 10"  (1.778 m)  Wt 212 lb  12.8 oz (96.525 kg)  BMI 30.53 kg/m2  Physical Exam:  Well appearing NAD HEENT: Unremarkable Neck:  No JVD, no thyromegally Lymphatics:  No adenopathy Back:  No CVA tenderness Lungs:  Clear HEART:  Iregular rate rhythm, no murmurs, no rubs, no clicks Abd:  soft, positive bowel sounds, no organomegally, no rebound, no guarding Ext:  2 plus pulses, no edema, no cyanosis, no clubbing Skin:  No rashes no nodules Neuro:  CN II through XII intact, motor grossly intact  EKG Atrial flutter with a controlled ventricular rate.  Assess/Plan:

## 2010-10-20 NOTE — Assessment & Plan Note (Signed)
His fibrillation appears to be well controlled. He will continue his flecainide.

## 2010-10-20 NOTE — Assessment & Plan Note (Signed)
His flutter persists. I have discussed the treatment options with the patient and have recommended catheter ablation. He is considering his options and will call us if he would like to proceed with catheter ablation.

## 2010-10-25 ENCOUNTER — Other Ambulatory Visit: Payer: Self-pay | Admitting: *Deleted

## 2010-10-25 DIAGNOSIS — I4892 Unspecified atrial flutter: Secondary | ICD-10-CM

## 2010-10-25 DIAGNOSIS — I4891 Unspecified atrial fibrillation: Secondary | ICD-10-CM

## 2010-10-25 NOTE — Telephone Encounter (Signed)
Addended by: Dennis Bast F on: 10/25/2010 01:53 PM   Modules accepted: Orders

## 2010-10-25 NOTE — Telephone Encounter (Signed)
EKG order

## 2010-10-26 ENCOUNTER — Telehealth: Payer: Self-pay | Admitting: Internal Medicine

## 2010-10-26 ENCOUNTER — Ambulatory Visit (HOSPITAL_COMMUNITY)
Admission: RE | Admit: 2010-10-26 | Discharge: 2010-10-27 | Disposition: A | Discharge status: Home / Self Care | Payer: Medicare Other | Source: Ambulatory Visit | Attending: Internal Medicine | Admitting: Internal Medicine

## 2010-10-26 DIAGNOSIS — Z7901 Long term (current) use of anticoagulants: Secondary | ICD-10-CM | POA: Insufficient documentation

## 2010-10-26 DIAGNOSIS — Z8673 Personal history of transient ischemic attack (TIA), and cerebral infarction without residual deficits: Secondary | ICD-10-CM | POA: Insufficient documentation

## 2010-10-26 DIAGNOSIS — I10 Essential (primary) hypertension: Secondary | ICD-10-CM | POA: Insufficient documentation

## 2010-10-26 DIAGNOSIS — I4892 Unspecified atrial flutter: Secondary | ICD-10-CM | POA: Insufficient documentation

## 2010-10-26 DIAGNOSIS — Z8546 Personal history of malignant neoplasm of prostate: Secondary | ICD-10-CM | POA: Insufficient documentation

## 2010-10-26 DIAGNOSIS — N200 Calculus of kidney: Secondary | ICD-10-CM | POA: Insufficient documentation

## 2010-10-26 DIAGNOSIS — E785 Hyperlipidemia, unspecified: Secondary | ICD-10-CM | POA: Insufficient documentation

## 2010-10-26 DIAGNOSIS — Z79899 Other long term (current) drug therapy: Secondary | ICD-10-CM | POA: Insufficient documentation

## 2010-10-26 NOTE — Telephone Encounter (Signed)
Pt wants to know if the nurse received blood work.

## 2010-10-26 NOTE — Telephone Encounter (Signed)
Short stay did not have the patient's blood work. Apparently the patient had his labs drawn @ his primary care. I called Dr. Caren Macadam office and they will fax the results to short stay now.

## 2010-11-02 NOTE — Discharge Summary (Addendum)
  [name], [name] NO.:  [id number]  MEDICAL RECORD NO.:  [id number]  LOCATION:  2031                         FACILITY:  [facility]  PHYSICIAN:  Duke Salvia, MD, FACCDATE OF BIRTH:  12-05-1945  DATE OF ADMISSION:  10/26/2010 DATE OF DISCHARGE:  10/27/2010                              DISCHARGE SUMMARY   DISCHARGE DIAGNOSES: 1. Atrial flutter, status post electrophysiology study and     radiofrequency catheter ablation of atrial flutter on October 26, 2010. 2. History of atrial fibrillation. 3. Chronic anticoagulation with Pradaxa. 4. Hyperlipidemia. 5. Hypertension. 6. History of prostate cancer. 7. History of cerebrovascular accident. 8. Nephrolithiasis.  PAST SURGICAL HISTORY:  Prostatectomy, back surgery, and appendectomy.  HOSPITAL COURSE:  [name] is a 65 year old gentleman with history of atrial fibrillation and atrial flutter.  He has been on flecainide, but recently developed atrial flutter.  His rate has not been particularly progressed, he has been on Pradaxa for anticoagulation.  Management options were discussed with the patient and Dr. Ladona Ridgel ultimately recommended catheter ablation.  The patient was brought in for this procedure on October 26, 2010, and ultimately had successful EP study and RF catheter ablation of typical atrial flutter with a total of 6 RF energy application delivered to the atrial flutter isthmus creating termination of flutter, restoration of sinus, creation of bidirectional block in the atrial flutter isthmus.  Dr. Ladona Ridgel has discontinued his Toprol on admission to the hospital and this was held throughout his hospitalization. He is maintained on a class IC without beta blockage with the assumption that his flutter circuit is now gone.  The patient did well post procedure.  Dr. Graciela Husbands has seen and examined him today and feels he is stable for discharge.  DISCHARGE LABS:  None.  STUDIES:  EP study and RF catheter  ablation of atrial flutter, please see full report for details.  DISCHARGE MEDICATIONS: 1. Flecainide 150 mg b.i.d. 2. Lipitor 20 mg daily. 3. Pradaxa 150 mg b.i.d. 4. Ultram 50 mg daily.  DISPOSITION:  [name] will be discharged in stable condition to home. He is to increase activity slowly and avoid lifting for 4 days.  He is to follow a low-sodium heart-healthy diet, to call or return if he notices any pain, swelling, bleeding, or pus at the cath site.  He will follow up with Dr. Ladona Ridgel as an outpatient and our office will call him with this appointment.  Given that he is on Pradaxa, he needs to know following of his INR and will be maintained on this medicine at discharge.  DURATION OF DISCHARGE ENCOUNTER:  Greater than 30 minutes including physician and PA time.     Ronie Spies, P.A.C.   ______________________________ Duke Salvia, MD, [facility]    DD/MEDQ  D:  10/27/2010  T:  10/27/2010  Job:  [id number]  cc:   Dr. Ladona Ridgel  Electronically Signed by Ronie Spies  on 11/02/2010 05:57:18 PM Electronically Signed by Sherryl Manges MD [facility] on 12/03/2010 01:50:02 PM

## 2010-11-15 ENCOUNTER — Other Ambulatory Visit: Payer: Self-pay | Admitting: *Deleted

## 2010-11-15 DIAGNOSIS — I4891 Unspecified atrial fibrillation: Secondary | ICD-10-CM

## 2010-11-15 MED ORDER — FLECAINIDE ACETATE 150 MG PO TABS: 150.0000 mg | ORAL_TABLET | Freq: Two times a day (BID) | ORAL | Status: DC

## 2010-11-16 NOTE — Op Note (Signed)
[name] [name], STUCKE NO.:  [id number]  MEDICAL RECORD NO.:  [id number]  LOCATION:  2031                         FACILITY:  [facility]  PHYSICIAN:  Doylene Canning. Ladona Ridgel, MD    DATE OF BIRTH:  [birth year]  DATE OF PROCEDURE:  10/26/2010 DATE OF DISCHARGE:                              OPERATIVE REPORT   PROCEDURE PERFORMED:  Electrophysiologic study and RF catheter ablation of atrial flutter.  INTRODUCTION:  The patient is a 65 year old male with a history of atrial fibrillation which has been well controlled on flecainide therapy.  The patient however has developed persistent atrial flutter with a slow atrial flutter cycle length.  He is now referred for catheter ablation.  PROCEDURE IN DETAIL:  After informed consent was obtained, the patient was taken to the diagnostic EP lab in a fasting state.  After usual preparation and draping, intravenous fentanyl and midazolam was given for sedation.  A 6-French hexapolar catheter was inserted percutaneously into the right jugular vein and advanced coronary sinus.  A 6-French quadripolar catheter was inserted percutaneously in the right femoral vein and advanced to the His bundle region.  A 7-French quadripolar ablation catheter was inserted percutaneously into the right femoral vein and advanced to the right atrium.  Mapping was carried out.  The atrial flutter cycle length was around 350 milliseconds.  The patient was carried out from the atrial flutter isthmus with entrainment mapping which demonstrated concealed entrainment with a post pacing interval of less than 30 milliseconds greater than the atrial flutter cycle length. The ablation catheter was then maneuvered into the atrial flutter isthmus.  A total of 6-RF energy applications were delivered.  During the second RF energy application, atrial flutter was terminated and sinus rhythm restored.  Two additional RF energy applications were delivered resulting in isthmus  block.  Finally two bonus RF energy applications were delivered, and the patient was observed for approximately 20 minutes.  There is no recurrent atrial flutter isthmus conduction.  At this point, rapid ventricular pacing was carried out from the right ventricle and then this demonstrated VA dissociation at 600 milliseconds.  Programmed ventricular stimulation was carried out also demonstrating VA dissociation at 600 milliseconds.  Following ablation, rapid atrial pacing was carried out demonstrating AV Wenckebach cycle length of 450 milliseconds.  During rapid atrial pacing, the PR interval was less than the RR interval and there was no inducible SVT.  Finally, programmed atrial stimulation was carried out at base drive cycle length of 16 milliseconds with the S1-S2 interval stepwise decreased down to 340 milliseconds where the AV node ERP was observed.  During programmed atrial stimulation, there were no AH jumps and no echo beats.  COMPLICATIONS:  There were no immediate procedure complications.  RESULTS:  A.  Baseline ECG.  Baseline ECG demonstrates atrial flutter with 2:1 AV conduction. B.  Baseline intervals.  Atrial flutter cycle length was 340 milliseconds.  The HV interval was 78 milliseconds.  QRS duration was 140 milliseconds. C.  Rapid ventricular pacing.  Rapid ventricular pacing demonstrated VA dissociation at baseline. D.  Deep programmed ventricular stimulation.  Programmed ventricular stimulation demonstrated VA dissociation at 600 milliseconds. E.  Rapid  atrial pacing following ablation.  Rapid atrial pacing demonstrated an AV Wenckebach cycle length of 450 milliseconds. F.  Programmed atrial stimulation.  Programmed atrial stimulation was carried out from the coronary sinus and the right atrium demonstrated an S1-S2 interval with 600/340 where the AV node ERP was observed.  During programmed atrial stimulation, there are no AH jumps, no echo beats, no inducible  SVT. G.  Mapping.  Mapping demonstrated typical counterclockwise tricuspid annular reentrant atrial flutter. H.  RF energy application.  A total of 6 RF energy applications were delivered resulting in termination of flutter, restoration of sinus rhythm, creation of bidirectional block and atrial flutter isthmus.  CONCLUSION:  The study demonstrates successful electrophysiologic study and RF catheter ablation of typical atrial flutter with a total of 6 RF energy applications delivered to the atrial flutter isthmus resulting in the termination of flutter, restoration of sinus rhythm, creation of bidirectional block, and atrial flutter isthmus.     Doylene Canning. Ladona Ridgel, MD     GWT/MEDQ  D:  10/26/2010  T:  10/27/2010  Job:  [id number]  Electronically Signed by Lewayne Bunting MD on 11/16/2010 09:52:18 AM

## 2010-11-27 ENCOUNTER — Encounter: Payer: Self-pay | Admitting: Internal Medicine

## 2010-11-27 ENCOUNTER — Ambulatory Visit (INDEPENDENT_AMBULATORY_CARE_PROVIDER_SITE_OTHER): Payer: Medicare Other | Admitting: Internal Medicine

## 2010-11-27 DIAGNOSIS — I4892 Unspecified atrial flutter: Secondary | ICD-10-CM

## 2010-11-27 DIAGNOSIS — E785 Hyperlipidemia, unspecified: Secondary | ICD-10-CM

## 2010-11-27 DIAGNOSIS — I4891 Unspecified atrial fibrillation: Secondary | ICD-10-CM

## 2010-11-27 NOTE — Assessment & Plan Note (Signed)
He has maintained NSR on flecainide. He will continue his current meds and Pradaxa.

## 2010-11-27 NOTE — Assessment & Plan Note (Signed)
He is s/p catheter ablation and doing well. No recurrent symptoms.

## 2010-11-27 NOTE — Progress Notes (Signed)
HPI Mr. Space returns today for followup. He is a pleasant 65 yo man with a h/o atrial fibrillation maintained in NSR on Flecainide. He has been on Pradaxa. He developed atrial flutter and underwent EPS/RFA of atrial flutter one month ago. He has not had any additional c/p, sob, or peripheral edema. No palpitations. No Known Allergies   Current Outpatient Prescriptions  Medication Sig Dispense Refill  . flecainide (TAMBOCOR) 150 MG tablet Take 1 tablet (150 mg total) by mouth 2 (two) times daily.  60 tablet  6  . PRADAXA 150 MG CAPS TAKE ONE CAPSULE BY MOUTH TWICE DAILY  60 capsule  6  . rosuvastatin (CRESTOR) 40 MG tablet Take 40 mg by mouth daily. Take 1/2 daily      . traMADol (ULTRAM) 50 MG tablet Take 50 mg by mouth every 6 (six) hours as needed.           Past Medical History  Diagnosis Date  . Hyperlipidemia   . Hypertension   . Nephrolithiasis   . Prostate cancer   . Paroxysmal atrial flutter   . Atrial fibrillation   . CVA (cerebral infarction) 07-14-09    ROS:   All systems reviewed and negative except as noted in the HPI.   Past Surgical History  Procedure Date  . Prostatectomy 2005  . Back surgery   . Appendectomy      Family History  Problem Relation Age of Onset  . Heart disease Mother 69    died  . Prostate cancer Father     alive  . Testicular cancer Brother     died     History   Social History  . Marital Status: Married    Spouse Name: N/A    Number of Children: 2  . Years of Education: N/A   Occupational History  . Archivist    Social History Main Topics  . Smoking status: Never Smoker   . Smokeless tobacco: Not on file  . Alcohol Use: No  . Drug Use: No  . Sexually Active: Not on file   Other Topics Concern  . Not on file   Social History Narrative  . No narrative on file     BP 136/86  Pulse 64  Resp 16  Ht 5\' 10"  (1.778 m)  Wt 209 lb 12.8 oz (95.165 kg)  BMI 30.10 kg/m2  Physical Exam:  Well appearing  NAD HEENT: Unremarkable Neck:  No JVD, no thyromegally Lymphatics:  No adenopathy Back:  No CVA tenderness Lungs:  Clear with no wheezes, rales, or rhonchi. HEART:  Regular rate rhythm, no murmurs, no rubs, no clicks Abd:  soft, positive bowel sounds, no organomegally, no rebound, no guarding Ext:  2 plus pulses, no edema, no cyanosis, no clubbing Skin:  No rashes no nodules Neuro:  CN II through XII intact, motor grossly intact  EKG Nsr.   Assess/Plan:

## 2010-11-27 NOTE — Patient Instructions (Signed)
Your physician wants you to follow-up in:  12 months.  You will receive a reminder letter in the mail two months in advance. If you don't receive a letter, please call our office to schedule the follow-up appointment.   

## 2010-11-27 NOTE — Assessment & Plan Note (Signed)
He will continue his cholesterol lowering meds.

## 2011-01-03 LAB — POCT URINALYSIS DIP (DEVICE)
Nitrite: NEGATIVE
Protein, ur: 30 — AB
Specific Gravity, Urine: 1.015
Urobilinogen, UA: 0.2
pH: 5

## 2011-01-03 LAB — POCT I-STAT, CHEM 8
BUN: 14
Calcium, Ion: 1.09 — ABNORMAL LOW
Chloride: 108
Glucose, Bld: 94
HCT: 46
TCO2: 24

## 2011-01-03 LAB — URINE MICROSCOPIC-ADD ON

## 2011-01-03 LAB — URINALYSIS, ROUTINE W REFLEX MICROSCOPIC
Bilirubin Urine: NEGATIVE
Glucose, UA: NEGATIVE
Protein, ur: NEGATIVE
Urobilinogen, UA: 0.2

## 2011-01-03 LAB — URINE CULTURE: Culture: NO GROWTH

## 2011-01-08 LAB — BASIC METABOLIC PANEL
BUN: 15
CO2: 25
Calcium: 9
Chloride: 106
Creatinine, Ser: 0.97

## 2011-01-08 LAB — TSH
TSH: 0.417
TSH: 1.92

## 2011-01-08 LAB — CBC
Hemoglobin: 14.3
Hemoglobin: 15
MCHC: 33
MCHC: 33.2
MCHC: 33.7
MCV: 92.3
Platelets: 203
Platelets: 209
RDW: 13
RDW: 13.2

## 2011-01-08 LAB — CARDIAC PANEL(CRET KIN+CKTOT+MB+TROPI)
CK, MB: 3.8
CK, MB: 4
Relative Index: 2.7 — ABNORMAL HIGH
Total CK: 148
Troponin I: 0.02

## 2011-01-08 LAB — B-NATRIURETIC PEPTIDE (CONVERTED LAB): Pro B Natriuretic peptide (BNP): 237 — ABNORMAL HIGH

## 2011-01-08 LAB — LIPID PANEL
Cholesterol: 140
LDL Cholesterol: 105 — ABNORMAL HIGH

## 2011-01-08 LAB — CK TOTAL AND CKMB (NOT AT ARMC)
CK, MB: 3.7
Total CK: 184

## 2011-01-08 LAB — APTT: aPTT: 28

## 2011-04-07 ENCOUNTER — Other Ambulatory Visit: Payer: Self-pay | Admitting: Internal Medicine

## 2011-04-08 ENCOUNTER — Other Ambulatory Visit: Payer: Self-pay

## 2011-04-08 MED ORDER — DABIGATRAN ETEXILATE MESYLATE 150 MG PO CAPS: 150.0000 mg | ORAL_CAPSULE | Freq: Two times a day (BID) | ORAL | Status: DC

## 2011-05-30 DIAGNOSIS — H43399 Other vitreous opacities, unspecified eye: Secondary | ICD-10-CM | POA: Diagnosis not present

## 2011-05-30 DIAGNOSIS — H2510 Age-related nuclear cataract, unspecified eye: Secondary | ICD-10-CM | POA: Diagnosis not present

## 2011-05-30 DIAGNOSIS — H40019 Open angle with borderline findings, low risk, unspecified eye: Secondary | ICD-10-CM | POA: Diagnosis not present

## 2011-05-30 DIAGNOSIS — H04129 Dry eye syndrome of unspecified lacrimal gland: Secondary | ICD-10-CM | POA: Diagnosis not present

## 2011-06-14 ENCOUNTER — Other Ambulatory Visit: Payer: Self-pay | Admitting: Internal Medicine

## 2011-06-14 ENCOUNTER — Other Ambulatory Visit: Payer: Self-pay

## 2011-06-14 DIAGNOSIS — I4891 Unspecified atrial fibrillation: Secondary | ICD-10-CM

## 2011-06-14 MED ORDER — FLECAINIDE ACETATE 150 MG PO TABS: 150.0000 mg | ORAL_TABLET | Freq: Two times a day (BID) | ORAL | Status: DC

## 2011-09-18 ENCOUNTER — Encounter: Payer: Self-pay | Admitting: Internal Medicine

## 2011-09-18 DIAGNOSIS — Z8546 Personal history of malignant neoplasm of prostate: Secondary | ICD-10-CM | POA: Diagnosis not present

## 2011-09-18 DIAGNOSIS — E785 Hyperlipidemia, unspecified: Secondary | ICD-10-CM | POA: Diagnosis not present

## 2011-09-18 DIAGNOSIS — I4891 Unspecified atrial fibrillation: Secondary | ICD-10-CM | POA: Diagnosis not present

## 2011-11-01 ENCOUNTER — Other Ambulatory Visit: Payer: Self-pay | Admitting: Internal Medicine

## 2012-01-06 ENCOUNTER — Ambulatory Visit: Payer: Medicare Other | Admitting: Internal Medicine

## 2012-01-14 ENCOUNTER — Other Ambulatory Visit: Payer: Self-pay | Admitting: Internal Medicine

## 2012-01-17 ENCOUNTER — Encounter: Payer: Self-pay | Admitting: Internal Medicine

## 2012-01-17 ENCOUNTER — Ambulatory Visit (INDEPENDENT_AMBULATORY_CARE_PROVIDER_SITE_OTHER): Payer: Medicare Other | Admitting: Internal Medicine

## 2012-01-17 VITALS — BP 124/79 | HR 63 | Ht 70.0 in | Wt 213.0 lb

## 2012-01-17 DIAGNOSIS — I4892 Unspecified atrial flutter: Secondary | ICD-10-CM

## 2012-01-17 DIAGNOSIS — G459 Transient cerebral ischemic attack, unspecified: Secondary | ICD-10-CM | POA: Diagnosis not present

## 2012-01-17 DIAGNOSIS — I4891 Unspecified atrial fibrillation: Secondary | ICD-10-CM | POA: Diagnosis not present

## 2012-01-17 NOTE — Assessment & Plan Note (Signed)
He is maintaining sinus rhythm. He will continue flecainide.

## 2012-01-17 NOTE — Assessment & Plan Note (Signed)
No recurrence, status post catheter ablation.

## 2012-01-17 NOTE — Assessment & Plan Note (Signed)
He will continue his systemic anti-coagulation. 

## 2012-01-17 NOTE — Patient Instructions (Addendum)
Your physician wants you to follow-up in: 12 months with Dr. Taylor. You will receive a reminder letter in the mail two months in advance. If you don't receive a letter, please call our office to schedule the follow-up appointment.    

## 2012-01-17 NOTE — Progress Notes (Signed)
HPI [name] returns today for followup. He is a pleasant middle aged man with a h/o atrial fibrillation, atrial flutter s/p ablation while on flecainide. He has done well in the interim. He denies chest pain or sob. He has had no palpitations or symptomatic atrial arrhythmias. No peripheral edema, or syncope. He is working Acupuncturist. No Known Allergies   Current Outpatient Prescriptions  Medication Sig Dispense Refill  . flecainide (TAMBOCOR) 150 MG tablet TAKE ONE TABLET BY MOUTH TWICE DAILY  60 tablet  0  . PRADAXA 150 MG CAPS TAKE ONE CAPSULE BY MOUTH TWICE DAILY  60 capsule  6  . rosuvastatin (CRESTOR) 40 MG tablet Take 1/2 daily      . traMADol (ULTRAM) 50 MG tablet Take 50 mg by mouth every 6 (six) hours as needed.           Past Medical History  Diagnosis Date  . Hyperlipidemia   . Hypertension   . Nephrolithiasis   . Prostate cancer   . Paroxysmal atrial flutter   . Atrial fibrillation   . CVA (cerebral infarction) 07-14-09    ROS:   All systems reviewed and negative except as noted in the HPI.   Past Surgical History  Procedure Date  . Prostatectomy 2005  . Back surgery   . Appendectomy      Family History  Problem Relation Age of Onset  . Heart disease Mother 71    died  . Prostate cancer Father     alive  . Testicular cancer Brother     died     History   Social History  . Marital Status: Married    Spouse Name: N/A    Number of Children: 2  . Years of Education: N/A   Occupational History  . Archivist    Social History Main Topics  . Smoking status: Never Smoker   . Smokeless tobacco: Not on file  . Alcohol Use: No  . Drug Use: No  . Sexually Active: Not on file   Other Topics Concern  . Not on file   Social History Narrative  . No narrative on file     BP 124/79  Pulse 63  Ht 5\' 10"  (1.778 m)  Wt 213 lb (96.616 kg)  BMI 30.56 kg/m2  Physical Exam:  Well appearing middle-aged man, NAD HEENT:  Unremarkable Neck:  No JVD, no thyromegally Lungs:  Clear with no wheezes, rales, or rhonchi. HEART:  Regular rate rhythm, no murmurs, no rubs, no clicks Abd:  soft, positive bowel sounds, no organomegally, no rebound, no guarding Ext:  2 plus pulses, no edema, no cyanosis, no clubbing Skin:  No rashes no nodules Neuro:  CN II through XII intact, motor grossly intact  EKG Normal sinus rhythm with left axis deviation  Assess/Plan:

## 2012-02-17 ENCOUNTER — Other Ambulatory Visit: Payer: Self-pay | Admitting: Internal Medicine

## 2012-03-21 ENCOUNTER — Other Ambulatory Visit: Payer: Self-pay | Admitting: Internal Medicine

## 2012-06-26 ENCOUNTER — Other Ambulatory Visit: Payer: Self-pay | Admitting: Internal Medicine

## 2012-06-29 ENCOUNTER — Telehealth: Payer: Self-pay | Admitting: Family Medicine

## 2012-06-29 NOTE — Telephone Encounter (Signed)
Just got #120 on 06/22/12 (duplicate?)

## 2012-07-02 ENCOUNTER — Telehealth: Payer: Self-pay | Admitting: Family Medicine

## 2012-07-02 MED ORDER — TRAMADOL HCL 50 MG PO TABS: 50.0000 mg | ORAL_TABLET | Freq: Four times a day (QID) | ORAL | Status: DC | PRN

## 2012-07-02 NOTE — Telephone Encounter (Signed)
Last refill 05/18/12

## 2012-07-02 NOTE — Telephone Encounter (Signed)
Ok to refill 120 

## 2012-07-02 NOTE — Telephone Encounter (Signed)
rx refilled.

## 2012-07-07 DIAGNOSIS — R310 Gross hematuria: Secondary | ICD-10-CM | POA: Diagnosis not present

## 2012-07-09 ENCOUNTER — Telehealth: Payer: Self-pay | Admitting: Family Medicine

## 2012-07-09 NOTE — Telephone Encounter (Signed)
Called pt and OK to take a lisinopril 10mg  form wife RX.  Told NTBS Fri/Mon.  No open appt available.  Told to call first thing in AM and get one of the daily slots that open then.

## 2012-07-09 NOTE — Telephone Encounter (Signed)
Give him lisinopril but then he needs to be seen fri or mon

## 2012-07-09 NOTE — Telephone Encounter (Signed)
Wife calling because BP high this morning.  Is concern due to readings.  Face very red?  No energy.  Wife wanted to give him one of her Lisinopril's but I told her NO  Wait to hear from Dr Tanya Nones.

## 2012-07-10 ENCOUNTER — Encounter: Payer: Self-pay | Admitting: Family Medicine

## 2012-07-10 ENCOUNTER — Ambulatory Visit (INDEPENDENT_AMBULATORY_CARE_PROVIDER_SITE_OTHER): Payer: Medicare Other | Admitting: Family Medicine

## 2012-07-10 VITALS — BP 100/58 | HR 62 | Temp 98.3°F | Resp 16 | Wt 214.0 lb

## 2012-07-10 DIAGNOSIS — I10 Essential (primary) hypertension: Secondary | ICD-10-CM | POA: Diagnosis not present

## 2012-07-10 MED ORDER — AMLODIPINE BESYLATE 5 MG PO TABS: 5.0000 mg | ORAL_TABLET | Freq: Every day | ORAL | Status: DC

## 2012-07-10 NOTE — Progress Notes (Signed)
Subjective:     Patient ID: [name] [name], male   DOB: 1945-05-20, 67 y.o.   MRN: [id number]  HPI  Patient has never had elevated blood pressures in the past.  However over the last week he has been having gross hematuria without pain. This made him very concerned about some type of cancer in his bladder or kidney. He went to see the urologist yesterday and has a workup pending biopsies very concerned.  Upon checking his blood pressures he was found to have blood pressures 160-170/100. This has him and his family very concerned. Previous blood pressure results at this facility have all been less than 120/80.  He denies any chest pain or shortness of breath.  He did take a benazepril yesterday and since then his blood pressure has come down to 100-130/60-90.   Past Medical History  Diagnosis Date  . Hyperlipidemia   . Hypertension   . Nephrolithiasis   . Prostate cancer   . Paroxysmal atrial flutter   . Atrial fibrillation   . CVA (cerebral infarction) 07-14-09   Current Outpatient Prescriptions on File Prior to Visit  Medication Sig Dispense Refill  . flecainide (TAMBOCOR) 150 MG tablet TAKE ONE TABLET BY MOUTH TWICE DAILY  60 tablet  6  . PRADAXA 150 MG CAPS TAKE ONE CAPSULE BY MOUTH TWICE DAILY  60 capsule  0  . rosuvastatin (CRESTOR) 40 MG tablet Take 1/2 daily      . traMADol (ULTRAM) 50 MG tablet Take 1 tablet (50 mg total) by mouth every 6 (six) hours as needed.  120 tablet  0   No current facility-administered medications on file prior to visit.    Review of Systems    review of systems is otherwise negative Objective:   Physical Exam  Constitutional: He appears well-developed and well-nourished. No distress.  Cardiovascular: Normal rate, regular rhythm and normal heart sounds.   No murmur heard. Pulmonary/Chest: Effort normal and breath sounds normal. No respiratory distress.  Skin: He is not diaphoretic.       Assessment:     Hypertension    Plan:     I feel this is  most likely due to anxiety stemming from the gross hematuria.  We will begin a very low dose blood pressure medicine, Norvasc 5 mg by mouth daily. Patient will check his blood pressure 3 times a day for the next week and then bring the values to me to review. If the blood pressures due to the low the next week as his anxiety abates Will discontinue amlodipine. However blood pressures remain high we may need to titrate medicines further.

## 2012-07-14 ENCOUNTER — Telehealth: Payer: Self-pay | Admitting: Internal Medicine

## 2012-07-14 DIAGNOSIS — R310 Gross hematuria: Secondary | ICD-10-CM | POA: Diagnosis not present

## 2012-07-14 DIAGNOSIS — R3129 Other microscopic hematuria: Secondary | ICD-10-CM | POA: Diagnosis not present

## 2012-07-14 DIAGNOSIS — Z8546 Personal history of malignant neoplasm of prostate: Secondary | ICD-10-CM | POA: Diagnosis not present

## 2012-07-14 DIAGNOSIS — K8020 Calculus of gallbladder without cholecystitis without obstruction: Secondary | ICD-10-CM | POA: Diagnosis not present

## 2012-07-14 DIAGNOSIS — N200 Calculus of kidney: Secondary | ICD-10-CM | POA: Diagnosis not present

## 2012-07-14 NOTE — Telephone Encounter (Signed)
Advised patient to call us when he receives CT/biopsy results

## 2012-07-14 NOTE — Telephone Encounter (Signed)
Spoke with patient who c/o blood in urine off and on for 3 weeks.  Patient states first episode was 3 weeks ago and it lasted 1 week.  Patient states he has not seen blood this morning but did see it yesterday.  Patient wants to know if he should stop his Pradaxa 150 mg BID.  Patient has not taken last 2 doses of medication.  I took case to Dr. Excell Seltzer, DOD since Dr. Ladona Ridgel and Tresa Endo are off today and he advised that patient may hold Pradaxa until he receives CT/biopsy results from urology but that he believes patient needs appointment soon for f/u.  Routing message to [facility] and Dr. Ladona Ridgel for advice as no appointments with NP/PAs or GT available this week.

## 2012-07-14 NOTE — Telephone Encounter (Signed)
New problem    Taking pradaxa 150mg  and experiencing blood in urine -pt wants to know if he should stop taking medication-pt is going to see a urologist

## 2012-07-16 NOTE — Telephone Encounter (Signed)
Dr Ladona Ridgel reviewed and is ok with patient holding until urology evaluation is complete

## 2012-07-17 NOTE — Telephone Encounter (Signed)
F/u    Pt unclear if he should continue pradaxa

## 2012-07-17 NOTE — Telephone Encounter (Signed)
Left message for call back.

## 2012-07-21 DIAGNOSIS — N201 Calculus of ureter: Secondary | ICD-10-CM | POA: Diagnosis not present

## 2012-07-21 DIAGNOSIS — R310 Gross hematuria: Secondary | ICD-10-CM | POA: Diagnosis not present

## 2012-07-21 NOTE — Telephone Encounter (Signed)
Has a kidney stone and is trying to pass it now.  Has an appointment this morning with Dr Annabell Howells this morning.  Will hopefully be able to restart the Pradaxa as soon as he passes the stone.  He will call me with the plan from the urologist

## 2012-07-28 DIAGNOSIS — L723 Sebaceous cyst: Secondary | ICD-10-CM | POA: Diagnosis not present

## 2012-07-28 DIAGNOSIS — L0291 Cutaneous abscess, unspecified: Secondary | ICD-10-CM | POA: Diagnosis not present

## 2012-07-30 NOTE — Telephone Encounter (Signed)
lmom for patient to return my call in regards to his restarting Pradaxa

## 2012-08-04 DIAGNOSIS — N201 Calculus of ureter: Secondary | ICD-10-CM | POA: Diagnosis not present

## 2012-08-06 ENCOUNTER — Other Ambulatory Visit: Payer: Self-pay | Admitting: Internal Medicine

## 2012-08-17 ENCOUNTER — Other Ambulatory Visit: Payer: Self-pay | Admitting: Family Medicine

## 2012-08-17 NOTE — Telephone Encounter (Signed)
Ok to fill 

## 2012-08-17 NOTE — Telephone Encounter (Signed)
?   OK to Refill  

## 2012-09-07 ENCOUNTER — Other Ambulatory Visit: Payer: Self-pay | Admitting: Family Medicine

## 2012-09-17 DIAGNOSIS — H35039 Hypertensive retinopathy, unspecified eye: Secondary | ICD-10-CM | POA: Diagnosis not present

## 2012-09-17 DIAGNOSIS — H538 Other visual disturbances: Secondary | ICD-10-CM | POA: Diagnosis not present

## 2012-09-17 DIAGNOSIS — H40019 Open angle with borderline findings, low risk, unspecified eye: Secondary | ICD-10-CM | POA: Diagnosis not present

## 2012-09-17 DIAGNOSIS — H04129 Dry eye syndrome of unspecified lacrimal gland: Secondary | ICD-10-CM | POA: Diagnosis not present

## 2012-09-17 DIAGNOSIS — H2510 Age-related nuclear cataract, unspecified eye: Secondary | ICD-10-CM | POA: Diagnosis not present

## 2012-09-17 DIAGNOSIS — H524 Presbyopia: Secondary | ICD-10-CM | POA: Diagnosis not present

## 2012-09-22 ENCOUNTER — Other Ambulatory Visit: Payer: Self-pay | Admitting: Internal Medicine

## 2012-10-06 DIAGNOSIS — H269 Unspecified cataract: Secondary | ICD-10-CM | POA: Diagnosis not present

## 2012-10-06 DIAGNOSIS — H2510 Age-related nuclear cataract, unspecified eye: Secondary | ICD-10-CM | POA: Diagnosis not present

## 2012-10-24 ENCOUNTER — Other Ambulatory Visit: Payer: Self-pay | Admitting: Family Medicine

## 2012-10-26 NOTE — Telephone Encounter (Signed)
?   Ok to refill..last refill 08/17/12

## 2012-10-26 NOTE — Telephone Encounter (Signed)
?   Ok to refill.last refill 05/14

## 2012-10-26 NOTE — Telephone Encounter (Signed)
Ok to refill 

## 2012-10-28 DIAGNOSIS — Z961 Presence of intraocular lens: Secondary | ICD-10-CM | POA: Diagnosis not present

## 2012-10-28 DIAGNOSIS — H2510 Age-related nuclear cataract, unspecified eye: Secondary | ICD-10-CM | POA: Diagnosis not present

## 2012-11-05 ENCOUNTER — Other Ambulatory Visit: Payer: Self-pay | Admitting: Internal Medicine

## 2012-11-17 DIAGNOSIS — H2510 Age-related nuclear cataract, unspecified eye: Secondary | ICD-10-CM | POA: Diagnosis not present

## 2012-11-17 DIAGNOSIS — H269 Unspecified cataract: Secondary | ICD-10-CM | POA: Diagnosis not present

## 2012-12-08 ENCOUNTER — Telehealth: Payer: Self-pay | Admitting: Family Medicine

## 2012-12-08 MED ORDER — TRAMADOL HCL 50 MG PO TABS: ORAL_TABLET | ORAL | Status: DC

## 2012-12-08 NOTE — Telephone Encounter (Signed)
ok 

## 2012-12-08 NOTE — Telephone Encounter (Signed)
Rx Refilled  

## 2012-12-08 NOTE — Telephone Encounter (Signed)
?   OK to Refill  

## 2012-12-08 NOTE — Telephone Encounter (Signed)
Tramadol HCL 50 mg tab 1 q6 hours prn #120 last rf 10/26/12

## 2013-01-25 ENCOUNTER — Encounter (INDEPENDENT_AMBULATORY_CARE_PROVIDER_SITE_OTHER): Payer: Self-pay

## 2013-01-25 ENCOUNTER — Encounter: Payer: Self-pay | Admitting: Internal Medicine

## 2013-01-25 ENCOUNTER — Ambulatory Visit (INDEPENDENT_AMBULATORY_CARE_PROVIDER_SITE_OTHER): Payer: Medicare Other | Admitting: Internal Medicine

## 2013-01-25 VITALS — BP 141/88 | HR 59 | Ht 70.0 in | Wt 214.6 lb

## 2013-01-25 DIAGNOSIS — I4891 Unspecified atrial fibrillation: Secondary | ICD-10-CM | POA: Diagnosis not present

## 2013-01-25 NOTE — Patient Instructions (Signed)
Your physician wants you to follow-up in: 12 months with Dr. Taylor. You will receive a reminder letter in the mail two months in advance. If you don't receive a letter, please call our office to schedule the follow-up appointment.    

## 2013-01-25 NOTE — Assessment & Plan Note (Signed)
He is maintaining sinus rhythm very nicely. We discussed maintaining systemic anticoagulation. Because of his history of a TIA/mini stroke, I recommended that he continue systemic anticoagulation. No change in his dosing of flecainide.

## 2013-01-25 NOTE — Progress Notes (Signed)
HPI Mr. Villavicencio returns today for followup. He is a pleasant middle aged man with a h/o atrial fibrillation, atrial flutter s/p ablation while on flecainide. He has done well in the interim. He denies chest pain or sob. He has had no palpitations or symptomatic atrial arrhythmias. No peripheral edema, or syncope. He is working Acupuncturist. He has a remote history of a mini stroke. No Known Allergies   Current Outpatient Prescriptions  Medication Sig Dispense Refill  . CRESTOR 40 MG tablet TAKE ONE TABLET BY MOUTH EVERY DAY  30 tablet  3  . flecainide (TAMBOCOR) 150 MG tablet TAKE ONE TABLET BY MOUTH TWICE DAILY.  60 tablet  4  . PRADAXA 150 MG CAPS TAKE ONE CAPSULE BY MOUTH TWICE DAILY  60 capsule  3  . traMADol (ULTRAM) 50 MG tablet TAKE ONE TABLET BY MOUTH EVERY 6 HOURS AS NEEDED  120 tablet  0   No current facility-administered medications for this visit.     Past Medical History  Diagnosis Date  . Hyperlipidemia   . Hypertension   . Nephrolithiasis   . Prostate cancer   . Paroxysmal atrial flutter   . Atrial fibrillation   . CVA (cerebral infarction) 07-14-09    ROS:   All systems reviewed and negative except as noted in the HPI.   Past Surgical History  Procedure Laterality Date  . Prostatectomy  2005  . Back surgery    . Appendectomy       Family History  Problem Relation Age of Onset  . Heart disease Mother 17    died  . Prostate cancer Father     alive  . Testicular cancer Brother     died     History   Social History  . Marital Status: Married    Spouse Name: N/A    Number of Children: 2  . Years of Education: N/A   Occupational History  . Archivist    Social History Main Topics  . Smoking status: Never Smoker   . Smokeless tobacco: Not on file  . Alcohol Use: No  . Drug Use: No  . Sexual Activity: Not on file   Other Topics Concern  . Not on file   Social History Narrative  . No narrative on file     BP 141/88   Pulse 59  Ht 5\' 10"  (1.778 m)  Wt 214 lb 9.6 oz (97.342 kg)  BMI 30.79 kg/m2  Physical Exam:  Well appearing middle-aged man, NAD HEENT: Unremarkable Neck:  No JVD, no thyromegally Lungs:  Clear with no wheezes, rales, or rhonchi. HEART:  Regular rate rhythm, no murmurs, no rubs, no clicks Abd:  soft, positive bowel sounds, no organomegally, no rebound, no guarding Ext:  2 plus pulses, no edema, no cyanosis, no clubbing Skin:  No rashes no nodules Neuro:  CN II through XII intact, motor grossly intact  EKG Normal sinus rhythm with left axis deviation  Assess/Plan:

## 2013-02-17 ENCOUNTER — Other Ambulatory Visit: Payer: Self-pay | Admitting: Internal Medicine

## 2013-02-19 DIAGNOSIS — H02839 Dermatochalasis of unspecified eye, unspecified eyelid: Secondary | ICD-10-CM | POA: Diagnosis not present

## 2013-02-19 DIAGNOSIS — H40019 Open angle with borderline findings, low risk, unspecified eye: Secondary | ICD-10-CM | POA: Diagnosis not present

## 2013-02-19 DIAGNOSIS — H04129 Dry eye syndrome of unspecified lacrimal gland: Secondary | ICD-10-CM | POA: Diagnosis not present

## 2013-04-24 ENCOUNTER — Other Ambulatory Visit: Payer: Self-pay | Admitting: Family Medicine

## 2013-04-24 ENCOUNTER — Other Ambulatory Visit: Payer: Self-pay | Admitting: Internal Medicine

## 2013-04-26 NOTE — Telephone Encounter (Signed)
ok 

## 2013-04-26 NOTE — Telephone Encounter (Signed)
?   OK to Refill  

## 2013-05-03 DIAGNOSIS — D1801 Hemangioma of skin and subcutaneous tissue: Secondary | ICD-10-CM | POA: Diagnosis not present

## 2013-05-03 DIAGNOSIS — L570 Actinic keratosis: Secondary | ICD-10-CM | POA: Diagnosis not present

## 2013-05-03 DIAGNOSIS — D239 Other benign neoplasm of skin, unspecified: Secondary | ICD-10-CM | POA: Diagnosis not present

## 2013-05-03 DIAGNOSIS — B351 Tinea unguium: Secondary | ICD-10-CM | POA: Diagnosis not present

## 2013-05-03 DIAGNOSIS — L821 Other seborrheic keratosis: Secondary | ICD-10-CM | POA: Diagnosis not present

## 2013-05-03 DIAGNOSIS — L739 Follicular disorder, unspecified: Secondary | ICD-10-CM | POA: Diagnosis not present

## 2013-06-16 ENCOUNTER — Other Ambulatory Visit: Payer: Self-pay | Admitting: Dermatology

## 2013-06-16 DIAGNOSIS — L905 Scar conditions and fibrosis of skin: Secondary | ICD-10-CM | POA: Diagnosis not present

## 2013-06-16 DIAGNOSIS — D1801 Hemangioma of skin and subcutaneous tissue: Secondary | ICD-10-CM | POA: Diagnosis not present

## 2013-06-16 DIAGNOSIS — D485 Neoplasm of uncertain behavior of skin: Secondary | ICD-10-CM | POA: Diagnosis not present

## 2013-06-16 DIAGNOSIS — L739 Follicular disorder, unspecified: Secondary | ICD-10-CM | POA: Diagnosis not present

## 2013-06-16 DIAGNOSIS — L821 Other seborrheic keratosis: Secondary | ICD-10-CM | POA: Diagnosis not present

## 2013-06-16 DIAGNOSIS — D239 Other benign neoplasm of skin, unspecified: Secondary | ICD-10-CM | POA: Diagnosis not present

## 2013-06-16 DIAGNOSIS — L723 Sebaceous cyst: Secondary | ICD-10-CM | POA: Diagnosis not present

## 2013-06-23 DIAGNOSIS — H26499 Other secondary cataract, unspecified eye: Secondary | ICD-10-CM | POA: Diagnosis not present

## 2013-06-23 DIAGNOSIS — H43819 Vitreous degeneration, unspecified eye: Secondary | ICD-10-CM | POA: Diagnosis not present

## 2013-06-23 DIAGNOSIS — H40019 Open angle with borderline findings, low risk, unspecified eye: Secondary | ICD-10-CM | POA: Diagnosis not present

## 2013-06-23 DIAGNOSIS — H35039 Hypertensive retinopathy, unspecified eye: Secondary | ICD-10-CM | POA: Diagnosis not present

## 2013-09-15 DIAGNOSIS — R109 Unspecified abdominal pain: Secondary | ICD-10-CM | POA: Diagnosis not present

## 2013-09-15 DIAGNOSIS — R310 Gross hematuria: Secondary | ICD-10-CM | POA: Diagnosis not present

## 2013-11-08 ENCOUNTER — Other Ambulatory Visit: Payer: Self-pay | Admitting: Internal Medicine

## 2013-12-11 ENCOUNTER — Other Ambulatory Visit: Payer: Self-pay | Admitting: Internal Medicine

## 2014-01-12 ENCOUNTER — Encounter: Payer: Self-pay | Admitting: Internal Medicine

## 2014-01-12 ENCOUNTER — Ambulatory Visit (INDEPENDENT_AMBULATORY_CARE_PROVIDER_SITE_OTHER): Payer: Medicare Other | Admitting: Internal Medicine

## 2014-01-12 VITALS — BP 148/90 | HR 57 | Ht 70.0 in | Wt 212.8 lb

## 2014-01-12 DIAGNOSIS — I4892 Unspecified atrial flutter: Secondary | ICD-10-CM | POA: Diagnosis not present

## 2014-01-12 DIAGNOSIS — G459 Transient cerebral ischemic attack, unspecified: Secondary | ICD-10-CM | POA: Diagnosis not present

## 2014-01-12 DIAGNOSIS — I480 Paroxysmal atrial fibrillation: Secondary | ICD-10-CM | POA: Diagnosis not present

## 2014-01-12 NOTE — Assessment & Plan Note (Signed)
This was thought to be embolic from his TIA. He will continue Pradaxa.

## 2014-01-12 NOTE — Progress Notes (Signed)
HPI Mr. Meals returns today for followup. He is a pleasant middle aged man with a h/o atrial fibrillation, atrial flutter s/p ablation while on flecainide. He has done well in the interim. He denies chest pain or sob. He has had no palpitations or symptomatic atrial arrhythmias. No peripheral edema, or syncope. He is working Dentist. He has a remote history of a mini stroke.  No Known Allergies   Current Outpatient Prescriptions  Medication Sig Dispense Refill  . flecainide (TAMBOCOR) 150 MG tablet TAKE ONE TABLET BY MOUTH TWICE DAILY  60 tablet  0  . PRADAXA 150 MG CAPS capsule TAKE ONE CAPSULE BY MOUTH TWICE DAILY  60 capsule  11  . traMADol (ULTRAM) 50 MG tablet TAKE ONE TABLET BY MOUTH EVERY 6 HOURS AS NEEDED  120 tablet  0  . CRESTOR 40 MG tablet TAKE ONE TABLET BY MOUTH EVERY DAY  30 tablet  3   No current facility-administered medications for this visit.     Past Medical History  Diagnosis Date  . Hyperlipidemia   . Hypertension   . Nephrolithiasis   . Prostate cancer   . Paroxysmal atrial flutter   . Atrial fibrillation   . CVA (cerebral infarction) 07-14-09    ROS:   All systems reviewed and negative except as noted in the HPI.   Past Surgical History  Procedure Laterality Date  . Prostatectomy  2005  . Back surgery    . Appendectomy       Family History  Problem Relation Age of Onset  . Heart disease Mother 33    died  . Prostate cancer Father     alive  . Testicular cancer Brother     died     History   Social History  . Marital Status: Married    Spouse Name: N/A    Number of Children: 2  . Years of Education: N/A   Occupational History  . Clinical research associate    Social History Main Topics  . Smoking status: Never Smoker   . Smokeless tobacco: Not on file  . Alcohol Use: No  . Drug Use: No  . Sexual Activity: Not on file   Other Topics Concern  . Not on file   Social History Narrative  . No narrative on file      BP 148/90  Pulse 57  Ht 5\' 10"  (1.778 m)  Wt 212 lb 12.8 oz (96.525 kg)  BMI 30.53 kg/m2  Physical Exam:  Well appearing middle-aged man, NAD HEENT: Unremarkable Neck:  No JVD, no thyromegally Lungs:  Clear with no wheezes, rales, or rhonchi. HEART:  Regular rate rhythm, no murmurs, no rubs, no clicks Abd:  soft, positive bowel sounds, no organomegally, no rebound, no guarding Ext:  2 plus pulses, no edema, no cyanosis, no clubbing Skin:  No rashes no nodules Neuro:  CN II through XII intact, motor grossly intact  EKG Normal sinus rhythm with left axis deviation  Assess/Plan:

## 2014-01-12 NOTE — Assessment & Plan Note (Signed)
He is maintaining NSR very nicely. No change in meds. 

## 2014-01-12 NOTE — Patient Instructions (Signed)
Your physician wants you to follow-up in: 1 year with Dr. Lovena Le. You will receive a reminder letter in the mail two months in advance. If you don't receive a letter, please call our office to schedule the follow-up appointment.  Your physician recommends that you continue on your current medications as directed. Please refer to the Current Medication list given to you today.

## 2014-02-12 ENCOUNTER — Other Ambulatory Visit: Payer: Self-pay | Admitting: Internal Medicine

## 2014-03-19 ENCOUNTER — Other Ambulatory Visit: Payer: Self-pay | Admitting: Internal Medicine

## 2014-03-23 NOTE — Telephone Encounter (Signed)
Patient requesting refill on Pradaxa but has no blood work in chart. Please advise.

## 2014-07-20 DIAGNOSIS — Z961 Presence of intraocular lens: Secondary | ICD-10-CM | POA: Diagnosis not present

## 2014-07-20 DIAGNOSIS — H40013 Open angle with borderline findings, low risk, bilateral: Secondary | ICD-10-CM | POA: Diagnosis not present

## 2014-07-20 DIAGNOSIS — H26493 Other secondary cataract, bilateral: Secondary | ICD-10-CM | POA: Diagnosis not present

## 2014-08-11 DIAGNOSIS — H26493 Other secondary cataract, bilateral: Secondary | ICD-10-CM | POA: Diagnosis not present

## 2014-08-11 DIAGNOSIS — H26491 Other secondary cataract, right eye: Secondary | ICD-10-CM | POA: Diagnosis not present

## 2014-08-11 DIAGNOSIS — Z961 Presence of intraocular lens: Secondary | ICD-10-CM | POA: Diagnosis not present

## 2014-10-03 DIAGNOSIS — H26492 Other secondary cataract, left eye: Secondary | ICD-10-CM | POA: Diagnosis not present

## 2015-01-06 ENCOUNTER — Other Ambulatory Visit: Payer: Self-pay | Admitting: Internal Medicine

## 2015-01-11 ENCOUNTER — Encounter: Payer: Self-pay | Admitting: Physician Assistant

## 2015-01-11 ENCOUNTER — Telehealth: Payer: Self-pay | Admitting: Internal Medicine

## 2015-01-11 NOTE — Telephone Encounter (Signed)
New message    Patient calling  C/O Afib now  - has appt later this month.

## 2015-01-11 NOTE — Progress Notes (Deleted)
Cardiology Office Note Date:  01/11/2015  Patient ID:  [name] [name], DOB 28-Oct-1945, MRN [id number] PCP:  Odette Fraction, MD  Cardiologist:  Dr. Cristopher Peru  ***refresh   Chief Complaint: palpitations  History of Present Illness: [name] [name] is a 70 y.o. male with history of paroxysmal atrial fibrillation, paroxysmal atrial flutter s/p ablation 10/2010, HTN, HLD, prostate CA, h/o stroke vs TIA, h/o dyslipidemia who presents for follow-up.  Prior studies include ETT in 2011 which was nondiagnostic due to not reaching 85% MHR, but he had no evidence of ischemia or arrhythmia on flecainide. 2D echo 07/2009: mild LVH, EF 55-60%, no RWMA, mild MR, mod dilated LA, mildly dilated RV/RA.  labs echo who following lipids poss add obesity    Past Medical History  Diagnosis Date  . Hyperlipidemia   . Hypertension   . Nephrolithiasis   . Prostate cancer ([facility])   . Paroxysmal atrial flutter ([place])     a. s/p ablation 2012.  . Paroxysmal atrial fibrillation ([place])   . CVA (cerebral infarction) 07-14-09    Past Surgical History  Procedure Laterality Date  . Prostatectomy  2005  . Back surgery    . Appendectomy      Current Outpatient Prescriptions  Medication Sig Dispense Refill  . CRESTOR 40 MG tablet TAKE ONE TABLET BY MOUTH EVERY DAY 30 tablet 3  . flecainide (TAMBOCOR) 150 MG tablet TAKE ONE TABLET BY MOUTH TWICE DAILY 60 tablet 0  . flecainide (TAMBOCOR) 150 MG tablet TAKE ONE TABLET BY MOUTH TWICE DAILY 60 tablet 11  . PRADAXA 150 MG CAPS capsule TAKE ONE CAPSULE BY MOUTH TWICE DAILY 60 capsule 0  . traMADol (ULTRAM) 50 MG tablet TAKE ONE TABLET BY MOUTH EVERY 6 HOURS AS NEEDED 120 tablet 0   No current facility-administered medications for this visit.    Allergies:   Review of patient's allergies indicates no known allergies.   Social History:  The patient  reports that he has never smoked. He does not have any smokeless tobacco history on file. He reports that he  does not drink alcohol or use illicit drugs.   Family History:  The patient's family history includes Heart disease (age of onset: 33) in his mother; Prostate cancer in his father; Testicular cancer in his brother.***  ROS:  Please see the history of present illness. Otherwise, review of systems is positive for ***.   All other systems are reviewed and otherwise negative.   PHYSICAL EXAM: *** VS:  There were no vitals taken for this visit. BMI: There is no weight on file to calculate BMI. Well nourished, well developed, in no acute distress HEENT: normocephalic, atraumatic Neck: no JVD, carotid bruits or masses Cardiac:  normal S1, S2; RRR; no murmurs, rubs, or gallops Lungs:  clear to auscultation bilaterally, no wheezing, rhonchi or rales Abd: soft, nontender, no hepatomegaly, + BS MS: no deformity or atrophy Ext: no edema Skin: warm and dry, no rash Neuro:  moves all extremities spontaneously, no focal abnormalities noted, follows commands Psych: euthymic mood, full affect   EKG:  Done today shows ***  Recent Labs: No results found for requested labs within last 365 days.  No results found for requested labs within last 365 days.   CrCl cannot be calculated (Unknown ideal weight.).   Wt Readings from Last 3 Encounters:  01/12/14 212 lb 12.8 oz (96.525 kg)  01/25/13 214 lb 9.6 oz (97.342 kg)  07/10/12 214 lb (97.07 kg)  Other studies reviewed: Additional studies/records reviewed today include: summarized above***  ASSESSMENT AND PLAN:  1. Paroxysmal atrial fib 2. Paroxysmal atrial flutter 3. Essential HTN 4. Dyslipidemia  Disposition: F/u with ***  Current medicines are reviewed at length with the patient today.  The patient did not have any concerns regarding medicines.***  Signed, Melina Copa PA-C 01/11/2015 6:16 PM     [address] [place] [address] [phone] (office)  [phone] (fax)

## 2015-01-11 NOTE — Telephone Encounter (Signed)
Last night around 6 or 7 went in to afib.  This is the first time in a year he has gone out of rhythm.  He felt weak last night.  Denies SOB, just notices heartbeat is irregular.  Wanting to know if he should move his appointment up  I have let him know I would look into this and call him back.  Dr Lovena Le is booked and the afib clinic is also.  Dr Lovena Le is here tomorrow and I have added him to flex at 9am

## 2015-01-12 ENCOUNTER — Telehealth: Payer: Self-pay | Admitting: Internal Medicine

## 2015-01-12 ENCOUNTER — Encounter: Payer: BLUE CROSS/BLUE SHIELD | Admitting: Physician Assistant

## 2015-01-12 NOTE — Telephone Encounter (Signed)
New problem    Pt stated he isn't in afib now and is feeling better and want to know if he still need to come in at 9:30. Please call pt,.

## 2015-01-12 NOTE — Progress Notes (Signed)
This encounter was created in error - please disregard.

## 2015-01-12 NOTE — Telephone Encounter (Signed)
Patient is going to keep his follow up as scheduled for Dr Lovena Le 10/26.  He is back in NSR and feeling better

## 2015-01-20 DIAGNOSIS — L738 Other specified follicular disorders: Secondary | ICD-10-CM | POA: Diagnosis not present

## 2015-01-20 DIAGNOSIS — L820 Inflamed seborrheic keratosis: Secondary | ICD-10-CM | POA: Diagnosis not present

## 2015-01-20 DIAGNOSIS — D224 Melanocytic nevi of scalp and neck: Secondary | ICD-10-CM | POA: Diagnosis not present

## 2015-01-20 DIAGNOSIS — L218 Other seborrheic dermatitis: Secondary | ICD-10-CM | POA: Diagnosis not present

## 2015-01-20 DIAGNOSIS — L570 Actinic keratosis: Secondary | ICD-10-CM | POA: Diagnosis not present

## 2015-01-20 DIAGNOSIS — D1801 Hemangioma of skin and subcutaneous tissue: Secondary | ICD-10-CM | POA: Diagnosis not present

## 2015-02-01 ENCOUNTER — Ambulatory Visit (INDEPENDENT_AMBULATORY_CARE_PROVIDER_SITE_OTHER): Payer: Medicare Other | Admitting: Internal Medicine

## 2015-02-01 ENCOUNTER — Encounter: Payer: Self-pay | Admitting: Internal Medicine

## 2015-02-01 VITALS — BP 130/80 | HR 50 | Ht 70.0 in | Wt 214.0 lb

## 2015-02-01 DIAGNOSIS — I480 Paroxysmal atrial fibrillation: Secondary | ICD-10-CM | POA: Diagnosis not present

## 2015-02-01 DIAGNOSIS — G459 Transient cerebral ischemic attack, unspecified: Secondary | ICD-10-CM

## 2015-02-01 MED ORDER — DABIGATRAN ETEXILATE MESYLATE 150 MG PO CAPS: 150.0000 mg | ORAL_CAPSULE | Freq: Two times a day (BID) | ORAL | Status: DC

## 2015-02-01 MED ORDER — FLECAINIDE ACETATE 150 MG PO TABS: 150.0000 mg | ORAL_TABLET | Freq: Two times a day (BID) | ORAL | Status: DC

## 2015-02-01 NOTE — Progress Notes (Signed)
HPI Mr. Gueye returns today for followup. He is a pleasant 69 yo man with a h/o atrial fibrillation, atrial flutter s/p ablation while on flecainide. He has done well in the interim. He denies chest pain or sob. He has had no palpitations or symptomatic atrial arrhythmias. No peripheral edema, or syncope. He is working Dentist. He has a remote history of a mini stroke. No bleeding on Pradaxa. No Known Allergies   Current Outpatient Prescriptions  Medication Sig Dispense Refill  . flecainide (TAMBOCOR) 150 MG tablet TAKE ONE TABLET BY MOUTH TWICE DAILY 60 tablet 11  . PRADAXA 150 MG CAPS capsule TAKE ONE CAPSULE BY MOUTH TWICE DAILY 60 capsule 0  . traMADol (ULTRAM) 50 MG tablet TAKE ONE TABLET BY MOUTH EVERY 6 HOURS AS NEEDED 120 tablet 0   No current facility-administered medications for this visit.     Past Medical History  Diagnosis Date  . Hyperlipidemia   . Hypertension   . Nephrolithiasis   . Prostate cancer ([place])   . Paroxysmal atrial flutter ([place])     a. s/p ablation 2012.  . Paroxysmal atrial fibrillation ([place])   . CVA (cerebral infarction) 07-14-09    ROS:   All systems reviewed and negative except as noted in the HPI.   Past Surgical History  Procedure Laterality Date  . Prostatectomy  2005  . Back surgery    . Appendectomy       Family History  Problem Relation Age of Onset  . Heart disease Mother 44    died  . Prostate cancer Father     alive  . Testicular cancer Brother     died     Social History   Social History  . Marital Status: Married    Spouse Name: N/A  . Number of Children: 2  . Years of Education: N/A   Occupational History  . Clinical research associate    Social History Main Topics  . Smoking status: Never Smoker   . Smokeless tobacco: Not on file  . Alcohol Use: No  . Drug Use: No  . Sexual Activity: Not on file   Other Topics Concern  . Not on file   Social History Narrative     BP 130/80 mmHg  Pulse 50   Ht 5\' 10"  (1.778 m)  Wt 214 lb (97.07 kg)  BMI 30.71 kg/m2  Physical Exam:  Well appearing 69 yo man, NAD HEENT: Unremarkable Neck: 6 cm JVD, no thyromegally Lungs:  Clear with no wheezes, rales, or rhonchi. HEART:  Regular rate rhythm, no murmurs, no rubs, no clicks Abd:  soft, positive bowel sounds, no organomegally, no rebound, no guarding Ext:  2 plus pulses, no edema, no cyanosis, no clubbing Skin:  No rashes no nodules Neuro:  CN II through XII intact, motor grossly intact  EKG Normal sinus brady with left axis deviation  Assess/Plan:

## 2015-02-01 NOTE — Assessment & Plan Note (Signed)
He stopped his crestor. I have asked him to followup with his primary MD.

## 2015-02-01 NOTE — Assessment & Plan Note (Signed)
He is tolerating pradaxa.

## 2015-02-01 NOTE — Addendum Note (Signed)
Addended by: Stanton Kidney on: 02/01/2015 09:50 AM   Modules accepted: Orders

## 2015-02-01 NOTE — Assessment & Plan Note (Signed)
He is maintaining NSR on flecainide. Will follow.

## 2015-02-01 NOTE — Patient Instructions (Signed)
Medication Instructions:  Your physician recommends that you continue on your current medications as directed. Please refer to the Current Medication list given to you today.  Labwork: None ordered  Testing/Procedures: None ordered  Follow-Up: Your physician wants you to follow-up in: 1 year with Dr. Taylor.  You will receive a reminder letter in the mail two months in advance. If you don't receive a letter, please call our office to schedule the follow-up appointment.   Any Other Special Instructions Will Be Listed Below (If Applicable).  If you need a refill on your cardiac medications before your next appointment, please call your pharmacy.  Thank you for choosing [facility]!!         

## 2015-02-02 NOTE — Progress Notes (Signed)
Routed copy of EKG to patient's PCP, per patient request

## 2015-02-08 ENCOUNTER — Other Ambulatory Visit: Payer: Medicare Other

## 2015-02-08 DIAGNOSIS — Z125 Encounter for screening for malignant neoplasm of prostate: Secondary | ICD-10-CM

## 2015-02-08 DIAGNOSIS — Z0000 Encounter for general adult medical examination without abnormal findings: Secondary | ICD-10-CM

## 2015-02-08 DIAGNOSIS — I4891 Unspecified atrial fibrillation: Secondary | ICD-10-CM

## 2015-02-08 DIAGNOSIS — E785 Hyperlipidemia, unspecified: Secondary | ICD-10-CM | POA: Diagnosis not present

## 2015-02-08 DIAGNOSIS — Z79899 Other long term (current) drug therapy: Secondary | ICD-10-CM

## 2015-02-08 LAB — CBC WITH DIFFERENTIAL/PLATELET
Basophils Absolute: 0.1 10*3/uL (ref 0.0–0.1)
Basophils Relative: 1 % (ref 0–1)
Eosinophils Absolute: 0.1 10*3/uL (ref 0.0–0.7)
Eosinophils Relative: 2 % (ref 0–5)
HEMATOCRIT: 49.2 % (ref 39.0–52.0)
Hemoglobin: 16.5 g/dL (ref 13.0–17.0)
Lymphocytes Relative: 26 % (ref 12–46)
Lymphs Abs: 1.4 10*3/uL (ref 0.7–4.0)
MCH: 30.3 pg (ref 26.0–34.0)
MCHC: 33.5 g/dL (ref 30.0–36.0)
MCV: 90.4 fL (ref 78.0–100.0)
MONO ABS: 0.6 10*3/uL (ref 0.1–1.0)
MONOS PCT: 12 % (ref 3–12)
MPV: 10.3 fL (ref 8.6–12.4)
Neutro Abs: 3.1 10*3/uL (ref 1.7–7.7)
Neutrophils Relative %: 59 % (ref 43–77)
Platelets: 221 10*3/uL (ref 150–400)
RBC: 5.44 MIL/uL (ref 4.22–5.81)
RDW: 13.6 % (ref 11.5–15.5)
WBC: 5.3 10*3/uL (ref 4.0–10.5)

## 2015-02-08 LAB — COMPLETE METABOLIC PANEL WITH GFR
ALBUMIN: 4.3 g/dL (ref 3.6–5.1)
ALK PHOS: 53 U/L (ref 40–115)
ALT: 24 U/L (ref 9–46)
AST: 22 U/L (ref 10–35)
BUN: 13 mg/dL (ref 7–25)
CO2: 24 mmol/L (ref 20–31)
Calcium: 8.8 mg/dL (ref 8.6–10.3)
Chloride: 106 mmol/L (ref 98–110)
Creat: 0.93 mg/dL (ref 0.70–1.18)
GFR, Est African American: >89 mL/min (ref >=60)
GFR, Est Non African American: 82 mL/min (ref >=60)
Glucose, Bld: 90 mg/dL (ref 70–99)
Potassium: 4.5 mmol/L (ref 3.5–5.3)
SODIUM: 138 mmol/L (ref 135–146)
Total Bilirubin: 0.6 mg/dL (ref 0.2–1.2)
Total Protein: 6.8 g/dL (ref 6.1–8.1)

## 2015-02-08 LAB — LIPID PANEL
Cholesterol: 203 mg/dL — ABNORMAL HIGH (ref 125–200)
HDL: 29 mg/dL — AB (ref >=40)
LDL Cholesterol: 152 mg/dL — ABNORMAL HIGH (ref <130)
Total CHOL/HDL Ratio: 7 Ratio — ABNORMAL HIGH (ref <=5.0)
Triglycerides: 110 mg/dL (ref <150)
VLDL: 22 mg/dL (ref <30)

## 2015-02-08 LAB — TSH: TSH: 0.846 u[IU]/mL (ref 0.350–4.500)

## 2015-02-09 LAB — PSA, MEDICARE

## 2015-02-10 ENCOUNTER — Encounter: Payer: Self-pay | Admitting: Family Medicine

## 2015-02-10 ENCOUNTER — Ambulatory Visit (INDEPENDENT_AMBULATORY_CARE_PROVIDER_SITE_OTHER): Payer: Medicare Other | Admitting: Family Medicine

## 2015-02-10 VITALS — BP 132/82 | HR 60 | Temp 98.6°F | Resp 18 | Ht 70.0 in | Wt 217.0 lb

## 2015-02-10 DIAGNOSIS — Z23 Encounter for immunization: Secondary | ICD-10-CM

## 2015-02-10 DIAGNOSIS — Z8679 Personal history of other diseases of the circulatory system: Secondary | ICD-10-CM | POA: Diagnosis not present

## 2015-02-10 DIAGNOSIS — E785 Hyperlipidemia, unspecified: Secondary | ICD-10-CM

## 2015-02-10 DIAGNOSIS — Z0000 Encounter for general adult medical examination without abnormal findings: Secondary | ICD-10-CM

## 2015-02-10 DIAGNOSIS — Z1211 Encounter for screening for malignant neoplasm of colon: Secondary | ICD-10-CM

## 2015-02-10 MED ORDER — ATORVASTATIN CALCIUM 40 MG PO TABS: 40.0000 mg | ORAL_TABLET | Freq: Every day | ORAL | Status: DC

## 2015-02-10 MED ORDER — TRAMADOL HCL 50 MG PO TABS: 50.0000 mg | ORAL_TABLET | Freq: Four times a day (QID) | ORAL | Status: DC | PRN

## 2015-02-10 NOTE — Progress Notes (Signed)
Subjective:    Patient ID: [name] [name], male    DOB: [birth year], 69 y.o.   MRN: [id number]  HPI I have not seen patient since 07/2012.  He is a very pleasant 69 y/o WM here for CPE.  According to his records, he is due for colonoscopy, pneumovax 23, prevnar 13, flu shot, and prostate exam.  He has had the shingles vaccine. He refuses the flu shot. He has a history of prostatectomy.  Therefore he is due for a PSA only. He is due for colonoscopy. He saw Dr. Penelope Coop in the past but he does not want to have another colonoscopy. His most recent lab work is listed below: Lab on 02/08/2015  Component Date Value Ref Range Status  . Sodium 02/08/2015 138  135 - 146 mmol/L Final  . Potassium 02/08/2015 4.5  3.5 - 5.3 mmol/L Final  . Chloride 02/08/2015 106  98 - 110 mmol/L Final  . CO2 02/08/2015 24  20 - 31 mmol/L Final  . Glucose, Bld 02/08/2015 90  70 - 99 mg/dL Final  . BUN 02/08/2015 13  7 - 25 mg/dL Final  . Creat 02/08/2015 0.93  0.70 - 1.18 mg/dL Final  . Total Bilirubin 02/08/2015 0.6  0.2 - 1.2 mg/dL Final  . Alkaline Phosphatase 02/08/2015 53  40 - 115 U/L Final  . AST 02/08/2015 22  10 - 35 U/L Final  . ALT 02/08/2015 24  9 - 46 U/L Final  . Total Protein 02/08/2015 6.8  6.1 - 8.1 g/dL Final  . Albumin 02/08/2015 4.3  3.6 - 5.1 g/dL Final  . Calcium 02/08/2015 8.8  8.6 - 10.3 mg/dL Final  . GFR, Est African American 02/08/2015 >89  >=60 mL/min Final  . GFR, Est Non African American 02/08/2015 82  >=60 mL/min Final   Comment:   The estimated GFR is a calculation valid for adults (>=77 years old) that uses the CKD-EPI algorithm to adjust for age and sex. It is   not to be used for children, pregnant women, hospitalized patients,    patients on dialysis, or with rapidly changing kidney function. According to the NKDEP, eGFR >89 is normal, 60-89 shows mild impairment, 30-59 shows moderate impairment, 15-29 shows severe impairment and <15 is ESRD.     Marland Kitchen TSH 02/08/2015 0.846  0.350 -  4.500 uIU/mL Final  . Cholesterol 02/08/2015 203* 125 - 200 mg/dL Final  . Triglycerides 02/08/2015 110  <150 mg/dL Final  . HDL 02/08/2015 29* >=40 mg/dL Final  . Total CHOL/HDL Ratio 02/08/2015 7.0* <=5.0 Ratio Final  . VLDL 02/08/2015 22  <30 mg/dL Final  . LDL Cholesterol 02/08/2015 152* <130 mg/dL Final   Comment:   Total Cholesterol/HDL Ratio:CHD Risk                        Coronary Heart Disease Risk Table                                        Men       Women          1/2 Average Risk              3.4        3.3              Average Risk  5.0        4.4           2X Average Risk              9.6        7.1           3X Average Risk             23.4       11.0 Use the calculated Patient Ratio above and the CHD Risk table  to determine the patient's CHD Risk.   . WBC 02/08/2015 5.3  4.0 - 10.5 K/uL Final  . RBC 02/08/2015 5.44  4.22 - 5.81 MIL/uL Final  . Hemoglobin 02/08/2015 16.5  13.0 - 17.0 g/dL Final  . HCT 02/08/2015 49.2  39.0 - 52.0 % Final  . MCV 02/08/2015 90.4  78.0 - 100.0 fL Final  . MCH 02/08/2015 30.3  26.0 - 34.0 pg Final  . MCHC 02/08/2015 33.5  30.0 - 36.0 g/dL Final  . RDW 02/08/2015 13.6  11.5 - 15.5 % Final  . Platelets 02/08/2015 221  150 - 400 K/uL Final  . MPV 02/08/2015 10.3  8.6 - 12.4 fL Final  . Neutrophils Relative % 02/08/2015 59  43 - 77 % Final  . Neutro Abs 02/08/2015 3.1  1.7 - 7.7 K/uL Final  . Lymphocytes Relative 02/08/2015 26  12 - 46 % Final  . Lymphs Abs 02/08/2015 1.4  0.7 - 4.0 K/uL Final  . Monocytes Relative 02/08/2015 12  3 - 12 % Final  . Monocytes Absolute 02/08/2015 0.6  0.1 - 1.0 K/uL Final  . Eosinophils Relative 02/08/2015 2  0 - 5 % Final  . Eosinophils Absolute 02/08/2015 0.1  0.0 - 0.7 K/uL Final  . Basophils Relative 02/08/2015 1  0 - 1 % Final  . Basophils Absolute 02/08/2015 0.1  0.0 - 0.1 K/uL Final  . Smear Review 02/08/2015 Criteria for review not met   Final  . PSA 02/08/2015 <0.01  <=4.00 ng/mL  Final   Comment: Result repeated and verified. Test Methodology: ECLIA PSA (Electrochemiluminescence Immunoassay)   For PSA values from 2.5-4.0, particularly in younger men <65 years old, the AUA and NCCN suggest testing for % Free PSA (3515) and evaluation of the rate of increase in PSA (PSA velocity).    Past Medical History  Diagnosis Date  . Hyperlipidemia   . Hypertension   . Nephrolithiasis   . Prostate cancer ([place])   . Paroxysmal atrial flutter ([place])     a. s/p ablation 2012.  . Paroxysmal atrial fibrillation ([place])   . CVA (cerebral infarction) 07-14-09   Past Surgical History  Procedure Laterality Date  . Prostatectomy  2005  . Back surgery    . Appendectomy     Current Outpatient Prescriptions on File Prior to Visit  Medication Sig Dispense Refill  . dabigatran (PRADAXA) 150 MG CAPS capsule Take 1 capsule (150 mg total) by mouth 2 (two) times daily. 60 capsule 11  . flecainide (TAMBOCOR) 150 MG tablet Take 1 tablet (150 mg total) by mouth 2 (two) times daily. 60 tablet 11  . traMADol (ULTRAM) 50 MG tablet TAKE ONE TABLET BY MOUTH EVERY 6 HOURS AS NEEDED 120 tablet 0   No current facility-administered medications on file prior to visit.   No Known Allergies Social History   Social History  . Marital Status: Married    Spouse Name: N/A  . Number of Children: 2  . Years  of Education: N/A   Occupational History  . Clinical research associate    Social History Main Topics  . Smoking status: Never Smoker   . Smokeless tobacco: Not on file  . Alcohol Use: No  . Drug Use: No  . Sexual Activity: Not on file   Other Topics Concern  . Not on file   Social History Narrative   Family History  Problem Relation Age of Onset  . Heart disease Mother 50    died  . Prostate cancer Father     alive  . Testicular cancer Brother     died      Review of Systems  All other systems reviewed and are negative.      Objective:   Physical Exam  Constitutional: He is oriented  to person, place, and time. He appears well-developed and well-nourished. No distress.  HENT:  Head: Normocephalic and atraumatic.  Right Ear: External ear normal.  Left Ear: External ear normal.  Nose: Nose normal.  Mouth/Throat: Oropharynx is clear and moist. No oropharyngeal exudate.  Eyes: Conjunctivae and EOM are normal. Pupils are equal, round, and reactive to light. Right eye exhibits no discharge. Left eye exhibits no discharge. No scleral icterus.  Neck: Normal range of motion. Neck supple. No JVD present. No tracheal deviation present. No thyromegaly present.  Cardiovascular: Normal rate, regular rhythm, normal heart sounds and intact distal pulses.  Exam reveals no gallop and no friction rub.   No murmur heard. Pulmonary/Chest: Effort normal and breath sounds normal. No stridor. No respiratory distress. He has no wheezes. He has no rales. He exhibits no tenderness.  Abdominal: Soft. Bowel sounds are normal. He exhibits no distension. There is no tenderness. There is no rebound and no guarding.  Musculoskeletal: Normal range of motion. He exhibits no edema or tenderness.  Lymphadenopathy:    He has no cervical adenopathy.  Neurological: He is alert and oriented to person, place, and time. He has normal reflexes. He displays normal reflexes. No cranial nerve deficit. He exhibits normal muscle tone. Coordination normal.  Skin: Skin is warm. No rash noted. He is not diaphoretic. No erythema. No pallor.  Psychiatric: He has a normal mood and affect. His behavior is normal. Judgment and thought content normal.  Vitals reviewed.         Assessment & Plan:  Routine general medical examination at a health care facility  History of atrial fibrillation  HLD (hyperlipidemia) - Plan: atorvastatin (LIPITOR) 40 MG tablet  Colon cancer screening  Need for prophylactic vaccination against Streptococcus pneumoniae (pneumococcus) - Plan: Pneumococcal polysaccharide vaccine 23-valent  greater than or equal to 2yo subcutaneous/IM  Recommended a colonoscopy of the patient declined. Recommended a flu shot but the patient declined. He did consent to Pneumovax 23. I will give him Prevnar 13 neck she. I will start him on Lipitor 40 mg by mouth daily and recheck a fasting lipid panel in 3 months. I refilled his tramadol. He takes medication twice a day. Hopefully the 120 pills will last him 2-3 months.

## 2015-03-24 DIAGNOSIS — H35032 Hypertensive retinopathy, left eye: Secondary | ICD-10-CM | POA: Diagnosis not present

## 2015-03-24 DIAGNOSIS — H35031 Hypertensive retinopathy, right eye: Secondary | ICD-10-CM | POA: Diagnosis not present

## 2015-03-24 DIAGNOSIS — H40013 Open angle with borderline findings, low risk, bilateral: Secondary | ICD-10-CM | POA: Diagnosis not present

## 2015-03-24 DIAGNOSIS — H04123 Dry eye syndrome of bilateral lacrimal glands: Secondary | ICD-10-CM | POA: Diagnosis not present

## 2015-05-02 DIAGNOSIS — H02841 Edema of right upper eyelid: Secondary | ICD-10-CM | POA: Diagnosis not present

## 2015-05-02 DIAGNOSIS — H00021 Hordeolum internum right upper eyelid: Secondary | ICD-10-CM | POA: Diagnosis not present

## 2015-05-15 ENCOUNTER — Ambulatory Visit (INDEPENDENT_AMBULATORY_CARE_PROVIDER_SITE_OTHER): Payer: Medicare Other | Admitting: Family Medicine

## 2015-05-15 ENCOUNTER — Encounter: Payer: Self-pay | Admitting: Family Medicine

## 2015-05-15 VITALS — BP 144/86 | HR 52 | Temp 98.5°F | Resp 18 | Wt 223.0 lb

## 2015-05-15 DIAGNOSIS — E785 Hyperlipidemia, unspecified: Secondary | ICD-10-CM | POA: Diagnosis not present

## 2015-05-15 LAB — LIPID PANEL
CHOLESTEROL: 108 mg/dL — AB (ref 125–200)
HDL: 29 mg/dL — ABNORMAL LOW (ref >=40)
LDL Cholesterol: 64 mg/dL (ref <130)
TRIGLYCERIDES: 74 mg/dL (ref <150)
Total CHOL/HDL Ratio: 3.7 Ratio (ref <=5.0)
VLDL: 15 mg/dL (ref <30)

## 2015-05-15 LAB — COMPLETE METABOLIC PANEL WITH GFR
ALBUMIN: 4 g/dL (ref 3.6–5.1)
ALK PHOS: 58 U/L (ref 40–115)
ALT: 26 U/L (ref 9–46)
AST: 20 U/L (ref 10–35)
BILIRUBIN TOTAL: 0.5 mg/dL (ref 0.2–1.2)
BUN: 15 mg/dL (ref 7–25)
CALCIUM: 9 mg/dL (ref 8.6–10.3)
CHLORIDE: 106 mmol/L (ref 98–110)
CO2: 25 mmol/L (ref 20–31)
CREATININE: 0.99 mg/dL (ref 0.70–1.18)
GFR, EST AFRICAN AMERICAN: 88 mL/min (ref >=60)
GFR, Est Non African American: 76 mL/min (ref >=60)
Glucose, Bld: 90 mg/dL (ref 70–99)
Potassium: 4.9 mmol/L (ref 3.5–5.3)
Sodium: 141 mmol/L (ref 135–146)
TOTAL PROTEIN: 6.6 g/dL (ref 6.1–8.1)

## 2015-05-15 NOTE — Progress Notes (Signed)
Subjective:    Patient ID: [name] [name], male    DOB: 21-Jun-1945, 70 y.o.   MRN: [id number]  HPI 02/2015 I have not seen patient since 07/2012.  He is a very pleasant 70 y/o WM here for CPE.  According to his records, he is due for colonoscopy, pneumovax 23, prevnar 13, flu shot, and prostate exam.  He has had the shingles vaccine. He refuses the flu shot. He has a history of prostatectomy.  Therefore he is due for a PSA only. He is due for colonoscopy. He saw Dr. Penelope Coop in the past but he does not want to have another colonoscopy.  At that time, my plan was: Recommended a colonoscopy of the patient declined. Recommended a flu shot but the patient declined. He did consent to Pneumovax 23. I will give him Prevnar 13 neck she. I will start him on Lipitor 40 mg by mouth daily and recheck a fasting lipid panel in 3 months. I refilled his tramadol. He takes medication twice a day. Hopefully the 120 pills will last him 2-3 months.  05/15/15 Here today to recheck his cholesterol.  He checks his blood pressure once or twice a month and it is always 130s over 80s. Here today it is elevated. He blames this on stress pertaining to his son. His son recently had open-heart surgery and had a valve replacement. There've been complications dealing with this. His son is also battling alcohol. He believes this might explain his elevated blood pressure. He denies any myalgias on Lipitor but he admits that he is only taking it 50% of the time because he blamed it on weight gain.  Past Medical History  Diagnosis Date  . Hyperlipidemia   . Hypertension   . Nephrolithiasis   . Prostate cancer ([place])   . Paroxysmal atrial flutter ([place])     a. s/p ablation 2012.  . Paroxysmal atrial fibrillation ([place])   . CVA (cerebral infarction) 07-14-09   Past Surgical History  Procedure Laterality Date  . Prostatectomy  2005  . Back surgery    . Appendectomy     Current Outpatient Prescriptions on File Prior to Visit    Medication Sig Dispense Refill  . atorvastatin (LIPITOR) 40 MG tablet Take 1 tablet (40 mg total) by mouth daily. 90 tablet 3  . dabigatran (PRADAXA) 150 MG CAPS capsule Take 1 capsule (150 mg total) by mouth 2 (two) times daily. 60 capsule 11  . flecainide (TAMBOCOR) 150 MG tablet Take 1 tablet (150 mg total) by mouth 2 (two) times daily. 60 tablet 11  . traMADol (ULTRAM) 50 MG tablet Take 1 tablet (50 mg total) by mouth every 6 (six) hours as needed. 120 tablet 0  . GLUCOSAMINE-CHONDROITIN PO Take 1 tablet by mouth daily. Reported on 05/15/2015     No current facility-administered medications on file prior to visit.   No Known Allergies Social History   Social History  . Marital Status: Married    Spouse Name: N/A  . Number of Children: 2  . Years of Education: N/A   Occupational History  . Clinical research associate    Social History Main Topics  . Smoking status: Never Smoker   . Smokeless tobacco: Not on file  . Alcohol Use: No  . Drug Use: No  . Sexual Activity: Not on file   Other Topics Concern  . Not on file   Social History Narrative   Family History  Problem Relation Age of Onset  . Heart  disease Mother 55    died  . Prostate cancer Father     alive  . Testicular cancer Brother     died      Review of Systems  All other systems reviewed and are negative.      Objective:   Physical Exam  Constitutional: He is oriented to person, place, and time. He appears well-developed and well-nourished. No distress.  HENT:  Head: Normocephalic and atraumatic.  Right Ear: External ear normal.  Left Ear: External ear normal.  Nose: Nose normal.  Mouth/Throat: Oropharynx is clear and moist. No oropharyngeal exudate.  Eyes: Conjunctivae and EOM are normal. Pupils are equal, round, and reactive to light. Right eye exhibits no discharge. Left eye exhibits no discharge. No scleral icterus.  Neck: Normal range of motion. Neck supple. No JVD present. No tracheal deviation present.  No thyromegaly present.  Cardiovascular: Normal rate, regular rhythm, normal heart sounds and intact distal pulses.  Exam reveals no gallop and no friction rub.   No murmur heard. Pulmonary/Chest: Effort normal and breath sounds normal. No stridor. No respiratory distress. He has no wheezes. He has no rales. He exhibits no tenderness.  Abdominal: Soft. Bowel sounds are normal. He exhibits no distension. There is no tenderness. There is no rebound and no guarding.  Musculoskeletal: Normal range of motion. He exhibits no edema or tenderness.  Lymphadenopathy:    He has no cervical adenopathy.  Neurological: He is alert and oriented to person, place, and time. He has normal reflexes. No cranial nerve deficit. He exhibits normal muscle tone. Coordination normal.  Skin: Skin is warm. No rash noted. He is not diaphoretic. No erythema. No pallor.  Psychiatric: He has a normal mood and affect. His behavior is normal. Judgment and thought content normal.  Vitals reviewed.         Assessment & Plan:  HLD (hyperlipidemia) - Plan: COMPLETE METABOLIC PANEL WITH GFR, Lipid panel  Recheck fasting lipid panel. Goal LDL cholesterol is less than 130. If LDL cholesterol is greater than 130, I would recommend that he take the Lipitor every day as it is not causing his weight gain. His blood pressure is elevated. Patient will check his blood pressure everyday at home over the next week and notify me of the values. If greater than 140/90, I will start him on losartan

## 2015-05-16 ENCOUNTER — Encounter: Payer: Self-pay | Admitting: *Deleted

## 2015-06-19 ENCOUNTER — Ambulatory Visit (INDEPENDENT_AMBULATORY_CARE_PROVIDER_SITE_OTHER): Payer: Medicare Other | Admitting: Family Medicine

## 2015-06-19 ENCOUNTER — Encounter: Payer: Self-pay | Admitting: Family Medicine

## 2015-06-19 VITALS — BP 120/90 | HR 58 | Temp 98.3°F | Resp 14 | Wt 220.0 lb

## 2015-06-19 DIAGNOSIS — H6120 Impacted cerumen, unspecified ear: Secondary | ICD-10-CM

## 2015-06-19 DIAGNOSIS — H918X9 Other specified hearing loss, unspecified ear: Secondary | ICD-10-CM

## 2015-06-19 DIAGNOSIS — H6123 Impacted cerumen, bilateral: Secondary | ICD-10-CM

## 2015-06-19 DIAGNOSIS — I10 Essential (primary) hypertension: Secondary | ICD-10-CM | POA: Diagnosis not present

## 2015-06-19 MED ORDER — TRAMADOL HCL 50 MG PO TABS: 50.0000 mg | ORAL_TABLET | Freq: Four times a day (QID) | ORAL | Status: DC | PRN

## 2015-06-19 MED ORDER — NEOMYCIN-POLYMYXIN-HC 3.5-10000-1 OT SOLN: 3.0000 [drp] | Freq: Four times a day (QID) | OTIC | Status: DC

## 2015-06-19 MED ORDER — LOSARTAN POTASSIUM 50 MG PO TABS: 50.0000 mg | ORAL_TABLET | Freq: Every day | ORAL | Status: DC

## 2015-06-19 NOTE — Addendum Note (Signed)
Addended by: Jenna Luo on: 06/19/2015 12:32 PM   Modules accepted: Orders

## 2015-06-19 NOTE — Progress Notes (Signed)
   Subjective:    Patient ID: [name] [name], male    DOB: [birth year], 70 y.o.   MRN: [id number]  HPI Patient reports 2 weeks of hearing loss in both ears and mild pain in his left ear. He denies any fevers or chills. He denies any dizziness or vertigo. He denies any headaches. On examination today he has bilateral cerumen impactions. He is also been monitoring his blood pressure at home consistently and has over 20 readings for me to review. The majority are greater than [id number] systolic and greater than 90 diastolic. Past Medical History  Diagnosis Date  . Hyperlipidemia   . Hypertension   . Nephrolithiasis   . Prostate cancer ([place])   . Paroxysmal atrial flutter ([place])     a. s/p ablation 2012.  . Paroxysmal atrial fibrillation ([place])   . CVA (cerebral infarction) 07-14-09   Past Surgical History  Procedure Laterality Date  . Prostatectomy  2005  . Back surgery    . Appendectomy     Current Outpatient Prescriptions on File Prior to Visit  Medication Sig Dispense Refill  . atorvastatin (LIPITOR) 40 MG tablet Take 1 tablet (40 mg total) by mouth daily. 90 tablet 3  . dabigatran (PRADAXA) 150 MG CAPS capsule Take 1 capsule (150 mg total) by mouth 2 (two) times daily. 60 capsule 11  . flecainide (TAMBOCOR) 150 MG tablet Take 1 tablet (150 mg total) by mouth 2 (two) times daily. 60 tablet 11   No current facility-administered medications on file prior to visit.   No Known Allergies Social History   Social History  . Marital Status: Married    Spouse Name: N/A  . Number of Children: 2  . Years of Education: N/A   Occupational History  . Clinical research associate    Social History Main Topics  . Smoking status: Never Smoker   . Smokeless tobacco: Not on file  . Alcohol Use: No  . Drug Use: No  . Sexual Activity: Not on file   Other Topics Concern  . Not on file   Social History Narrative      Review of Systems  All other systems reviewed and are negative.      Objective:   Physical Exam  Cardiovascular: Normal rate and normal heart sounds.   Pulmonary/Chest: Effort normal and breath sounds normal.  Vitals reviewed. Bilateral cerumen impaction        Assessment & Plan:  Benign essential HTN - Plan: losartan (COZAAR) 50 MG tablet  Bilateral hearing loss due to cerumen impaction  Begin losartan 50 mg by mouth daily for hypertension. Bilateral cerumen impactions were removed using irrigation and lavage without complication.

## 2015-09-25 DIAGNOSIS — H01003 Unspecified blepharitis right eye, unspecified eyelid: Secondary | ICD-10-CM | POA: Diagnosis not present

## 2015-09-25 DIAGNOSIS — H40013 Open angle with borderline findings, low risk, bilateral: Secondary | ICD-10-CM | POA: Diagnosis not present

## 2015-09-25 DIAGNOSIS — H04123 Dry eye syndrome of bilateral lacrimal glands: Secondary | ICD-10-CM | POA: Diagnosis not present

## 2015-10-20 ENCOUNTER — Telehealth: Payer: Self-pay

## 2015-10-20 NOTE — Telephone Encounter (Signed)
Letter received from [place], with denial of Tier Exception for Pradaxa. They state reasoning is because he has not tried any formulary alternatives.

## 2015-11-22 ENCOUNTER — Telehealth: Payer: Self-pay | Admitting: Internal Medicine

## 2015-11-22 NOTE — Telephone Encounter (Addendum)
Pradaxa is a high tier and he would like to try either Xarelto or Eliquis He is going to call back after talking with his insurance provider to see what they will cover

## 2015-11-22 NOTE — Telephone Encounter (Signed)
New message       Pt request that the nurse calls him. Please call.

## 2015-11-23 NOTE — Telephone Encounter (Signed)
Follow up        Insurance will cover both xarelto and eliquis.  Pt states he prefers to take the medication once a day which is the xarelto.  However, pt still want to talk the the nurse because he has a question about how to get started with the medication----will he need lab work first, etc.

## 2015-11-24 MED ORDER — RIVAROXABAN 20 MG PO TABS: 20.0000 mg | ORAL_TABLET | Freq: Every day | ORAL | 11 refills | Status: DC

## 2015-11-24 NOTE — Telephone Encounter (Signed)
Spoke with wife and let her know he can start Xarelto 20mg  the day after he stops his Pradaxa.  I will call in to pharmacy

## 2016-01-22 ENCOUNTER — Encounter: Payer: Self-pay | Admitting: Internal Medicine

## 2016-01-23 ENCOUNTER — Encounter (INDEPENDENT_AMBULATORY_CARE_PROVIDER_SITE_OTHER): Payer: Self-pay

## 2016-01-23 ENCOUNTER — Ambulatory Visit (INDEPENDENT_AMBULATORY_CARE_PROVIDER_SITE_OTHER): Payer: Medicare Other | Admitting: Internal Medicine

## 2016-01-23 ENCOUNTER — Encounter: Payer: Self-pay | Admitting: Internal Medicine

## 2016-01-23 VITALS — BP 142/88 | HR 49 | Ht 70.0 in | Wt 223.0 lb

## 2016-01-23 DIAGNOSIS — I480 Paroxysmal atrial fibrillation: Secondary | ICD-10-CM | POA: Diagnosis not present

## 2016-01-23 NOTE — Addendum Note (Signed)
Addended by: Zebedee Iba on: 01/23/2016 08:31 AM   Modules accepted: Orders

## 2016-01-23 NOTE — Progress Notes (Signed)
HPI [name] returns today for followup. He is a pleasant 70 yo man with a h/o atrial fibrillation, atrial flutter s/p ablation while on flecainide. He has done well in the interim. He denies chest pain or sob. He has had minimal palpitations. No peripheral edema, or syncope. He is working Dentist. He has a remote history of a mini stroke. No bleeding on Xarelto. No Known Allergies   Current Outpatient Prescriptions  Medication Sig Dispense Refill  . flecainide (TAMBOCOR) 150 MG tablet Take 1 tablet (150 mg total) by mouth 2 (two) times daily. 60 tablet 11  . losartan (COZAAR) 50 MG tablet Take 1 tablet (50 mg total) by mouth daily. 90 tablet 3  . Multiple Vitamins-Minerals (CENTRUM SILVER ADULT 50+ PO) Take by mouth.    . neomycin-polymyxin-hydrocortisone (CORTISPORIN) otic solution Place 3 drops into the left ear 4 (four) times daily. 10 mL 0  . rivaroxaban (XARELTO) 20 MG TABS tablet Take 1 tablet (20 mg total) by mouth daily with supper. 30 tablet 11  . rosuvastatin (CRESTOR) 40 MG tablet Take 40 mg by mouth daily.    . traMADol (ULTRAM) 50 MG tablet Take 1 tablet (50 mg total) by mouth every 6 (six) hours as needed. 120 tablet 0   No current facility-administered medications for this visit.      Past Medical History:  Diagnosis Date  . CVA (cerebral infarction) 07-14-09  . Hyperlipidemia   . Hypertension   . Nephrolithiasis   . Paroxysmal atrial fibrillation ([facility])   . Paroxysmal atrial flutter ([place])    a. s/p ablation 2012.  . Prostate cancer ([place])     ROS:   All systems reviewed and negative except as noted in the HPI.   Past Surgical History:  Procedure Laterality Date  . APPENDECTOMY    . BACK SURGERY    . PROSTATECTOMY  2005     Family History  Problem Relation Age of Onset  . Heart disease Mother 76    died  . Prostate cancer Father     alive  . Testicular cancer Brother     died     Social History   Social History  . Marital  status: Married    Spouse name: N/A  . Number of children: 2  . 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 Smoking status: Never Smoker  . Smokeless tobacco: Never Used  . Alcohol use No  . Drug use: No  . Sexual activity: Not on file   Other Topics Concern  . Not on file   Social History Narrative  . No narrative on file     BP (!) 142/88   Pulse (!) 49   Ht 5\' 10"  (1.778 m)   Wt 223 lb (101.2 kg)   BMI 32.00 kg/m   Physical Exam:  Well appearing 70 yo man, NAD HEENT: Unremarkable Neck: 6 cm JVD, no thyromegally Lungs:  Clear with no wheezes, rales, or rhonchi. HEART:  Regular rate rhythm, no murmurs, no rubs, no clicks Abd:  soft, positive bowel sounds, no organomegally, no rebound, no guarding Ext:  2 plus pulses, no edema, no cyanosis, no clubbing Skin:  No rashes no nodules Neuro:  CN II through XII intact, motor grossly intact  EKG Normal sinus brady with left axis deviation  Assess/Plan:  1. PAF - he will continue his flecainide. He will continue Xarelto  2. Sinus bradycardia - he is asymptomatic. We discussed the warning signs that he might ultimately one day need a PPM 3. HTN - he has been placed on Losartan. No complaints. 4. Dyslipidemia - he will continue his statin. No muscle aches.  Mikle Bosworth.D.

## 2016-01-23 NOTE — Patient Instructions (Signed)

## 2016-03-12 ENCOUNTER — Other Ambulatory Visit: Payer: Self-pay | Admitting: Internal Medicine

## 2016-04-15 DIAGNOSIS — H43813 Vitreous degeneration, bilateral: Secondary | ICD-10-CM | POA: Diagnosis not present

## 2016-04-15 DIAGNOSIS — H40013 Open angle with borderline findings, low risk, bilateral: Secondary | ICD-10-CM | POA: Diagnosis not present

## 2016-04-15 DIAGNOSIS — Z961 Presence of intraocular lens: Secondary | ICD-10-CM | POA: Diagnosis not present

## 2016-04-15 DIAGNOSIS — H35033 Hypertensive retinopathy, bilateral: Secondary | ICD-10-CM | POA: Diagnosis not present

## 2016-04-17 ENCOUNTER — Encounter: Payer: Self-pay | Admitting: Family Medicine

## 2016-04-17 DIAGNOSIS — H409 Unspecified glaucoma: Secondary | ICD-10-CM | POA: Insufficient documentation

## 2016-07-15 ENCOUNTER — Other Ambulatory Visit: Payer: Self-pay | Admitting: Family Medicine

## 2016-07-15 DIAGNOSIS — I10 Essential (primary) hypertension: Secondary | ICD-10-CM

## 2016-07-15 NOTE — Telephone Encounter (Signed)
Medication refill for one time only.  Patient needs to be seen.  Letter sent for patient to call and schedule.  Has been over 1 year since last visit.

## 2016-07-19 ENCOUNTER — Telehealth: Payer: Self-pay | Admitting: Internal Medicine

## 2016-07-19 NOTE — Telephone Encounter (Signed)
Follow up    Pt going out of town on Monday at [place] needs a prescription to take with him of the Pradaxa,

## 2016-07-19 NOTE — Telephone Encounter (Signed)
Advised patient that we do not have approval from Dr Lovena Le yet.  Explained that I will call him once reviewed. He is having SE r/t Xarelto including back pain and bladder issues.  He also has hx of 2 back surgeries.  (He explains that he was on Pradaxa before and switched because Xarelto was cheaper tier.  Insurance has sent tier exception form, but I have not received yet)  Pt understands that it will be next week before able to address with Dr. Lovena Le. Pt will be out of town till Thursday.

## 2016-07-19 NOTE — Telephone Encounter (Signed)
New Message      Pt wants to change prescription back from Xarelto to Pradaxa , he need a prescription sent into [facility] on [address]   Pt c/o medication issue:  1. Name of Medication: Xarelto 2. How are you currently taking this medication (dosage and times per day)? As prescribed  3. Are you having a reaction (difficulty breathing--STAT)? no 4. What is your medication issue? Having a lot of back pain , that he thinks its caused by the Xarelto  Can you send a letter to his insurance company saying he needs to take the Pradaxa due to reaction to Xarelto they will cover the cost of the Pradaxa

## 2016-07-22 ENCOUNTER — Telehealth: Payer: Self-pay | Admitting: Internal Medicine

## 2016-07-22 NOTE — Telephone Encounter (Signed)
[name] [name] with [employer] calling in regards to a prior authorization and  states that she would like to speak with a nurse for the "clinical to process request."  Please call [name] [name] at [phone] and press "Option 5."

## 2016-07-23 MED ORDER — DABIGATRAN ETEXILATE MESYLATE 150 MG PO CAPS: 150.0000 mg | ORAL_CAPSULE | Freq: Two times a day (BID) | ORAL | 0 refills | Status: DC

## 2016-07-23 NOTE — Telephone Encounter (Signed)
Ok to switch. GT 

## 2016-07-23 NOTE — Telephone Encounter (Signed)
F/U Calll:  Tamika calling from [place], states that the Pradaxa was denied because there were other alternatives available in a lower tier. Tamika states that she will fax over the information from the call. She can be reached at [phone]. Thanks.

## 2016-07-23 NOTE — Telephone Encounter (Addendum)
**Note De-Identified Guilianna Mckoy Obfuscation** Per Dr Lovena Le it is ok for the pt to switch back to Pradaxa 150 mg BID from Xarelto.  I did Pradaxa PA over the phone with Billie at [facility] this morning. Awaiting response.  Medication list has been updated: Xarelto removed and Pradaxa 150 mg (take 1 tablet BID) added.

## 2016-07-23 NOTE — Telephone Encounter (Signed)
Received a call from [place] at [facility]. I did Pradaxa PA over the phone. Per Dalene Seltzer we will receive confirmation by phone in a few days.

## 2016-07-25 ENCOUNTER — Telehealth: Payer: Self-pay

## 2016-07-25 ENCOUNTER — Telehealth: Payer: Self-pay | Admitting: Internal Medicine

## 2016-07-25 NOTE — Telephone Encounter (Signed)
°  New Message   pt verbalized that he is returning call for rn   He just got back in town and he is following up  on a call left on his vm about his medication request

## 2016-07-25 NOTE — Telephone Encounter (Signed)
Fax received from [facility] with a denial of Tier exception for Pradaxa. Patient needs to try and fail 2 formulary alternatives.

## 2016-07-25 NOTE — Telephone Encounter (Signed)
Spoke with patient today. Advised him that [place] has denied a Tier exception for Pradaxa. He would rather not try Eliquis. I told him I could do an appeal, and see what happens. He would like to proceed with an appeal, which I am faxing.

## 2016-07-25 NOTE — Telephone Encounter (Signed)
New message    [employer] medicare appeals is calling about a standard part D appeal for Pradaxa 150mg . It will be a 7 day turn around time. An analyst working this case will contact you. And if there is any more information you need to fax it can be sent to [phone]

## 2016-07-26 ENCOUNTER — Encounter: Payer: Self-pay | Admitting: Family Medicine

## 2016-07-26 ENCOUNTER — Ambulatory Visit (INDEPENDENT_AMBULATORY_CARE_PROVIDER_SITE_OTHER): Payer: Medicare Other | Admitting: Family Medicine

## 2016-07-26 VITALS — BP 110/68 | HR 80 | Temp 98.0°F | Resp 14 | Ht 70.0 in | Wt 222.0 lb

## 2016-07-26 DIAGNOSIS — Z8679 Personal history of other diseases of the circulatory system: Secondary | ICD-10-CM

## 2016-07-26 DIAGNOSIS — E7800 Pure hypercholesterolemia, unspecified: Secondary | ICD-10-CM | POA: Diagnosis not present

## 2016-07-26 DIAGNOSIS — I10 Essential (primary) hypertension: Secondary | ICD-10-CM

## 2016-07-26 DIAGNOSIS — Z8546 Personal history of malignant neoplasm of prostate: Secondary | ICD-10-CM

## 2016-07-26 MED ORDER — TRAMADOL HCL 50 MG PO TABS: 50.0000 mg | ORAL_TABLET | Freq: Four times a day (QID) | ORAL | 0 refills | Status: DC | PRN

## 2016-07-26 MED ORDER — RIVAROXABAN 20 MG PO TABS: 20.0000 mg | ORAL_TABLET | Freq: Every day | ORAL | 0 refills | Status: DC

## 2016-07-26 MED ORDER — ROSUVASTATIN CALCIUM 40 MG PO TABS: 40.0000 mg | ORAL_TABLET | Freq: Every day | ORAL | 3 refills | Status: DC

## 2016-07-26 NOTE — Progress Notes (Signed)
Subjective:    Patient ID: Francoise Ceo, male    DOB: 01-31-1946, 71 y.o.   MRN: [id number]  HPI As is his pattern, the patient only presents today after we have refuse to refill his medication. He does not follow-up regularly.  His last lab work although excellent was in February 2017.  Blood pressure today is well controlled. He denies any chest pain or shortness of breath or dyspnea on exertion. He does occasionally report palpitations. He is not sure whether he is going in and out of atrial fibrillation. He is currently on flecainide for rhythm control and is appropriately anticoagulated on Xarelto. He denies any syncope or near syncope. He denies any tachycardia. He denies any myalgias or right upper quadrant pain on Crestor. He does have a history of prostate cancer and he is long overdue for PSA. Past Medical History:  Diagnosis Date  . CVA (cerebral infarction) 07-14-09  . Hyperlipidemia   . Hypertension   . Nephrolithiasis   . Paroxysmal atrial fibrillation ([facility])   . Paroxysmal atrial flutter ([place])    a. s/p ablation 2012.  . Prostate cancer [facility])    Past Surgical History:  Procedure Laterality Date  . APPENDECTOMY    . BACK SURGERY    . PROSTATECTOMY  2005   Current Outpatient Prescriptions on File Prior to Visit  Medication Sig Dispense Refill  . losartan (COZAAR) 50 MG tablet TAKE ONE TABLET BY MOUTH ONCE DAILY 30 tablet 0  . Multiple Vitamins-Minerals (CENTRUM SILVER ADULT 50+ PO) Take by mouth.    . rosuvastatin (CRESTOR) 40 MG tablet Take 40 mg by mouth daily.    . traMADol (ULTRAM) 50 MG tablet Take 1 tablet (50 mg total) by mouth every 6 (six) hours as needed. 120 tablet 0   No current facility-administered medications on file prior to visit.    No Known Allergies Social History   Social History  . Marital status: Married    Spouse name: N/A  . Number of children: 2  . 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 Smoking status: Never Smoker  . Smokeless tobacco: Never Used  . Alcohol use No  . Drug use: No  . Sexual activity: Not on file   Other Topics Concern  . Not on file   Social History Narrative  . No narrative on file      Review of Systems  All other systems reviewed and are negative.      Objective:   Physical Exam  Constitutional: He is oriented to person, place, and time. He appears well-developed and well-nourished. No distress.  Eyes: Conjunctivae are normal. No scleral icterus.  Neck: Neck supple. No JVD present. No thyromegaly present.  Cardiovascular: Normal rate and normal heart sounds.   Pulmonary/Chest: Effort normal and breath sounds normal. No respiratory distress. He has no wheezes. He has no rales. He exhibits no tenderness.  Abdominal: Soft. Bowel sounds are normal. He exhibits no distension and no mass. There is no tenderness. There is no rebound and no guarding.  Musculoskeletal: He exhibits no edema.  Lymphadenopathy:    He has no cervical adenopathy.  Neurological: He is alert and oriented to person, place, and time. He has normal reflexes. He displays normal reflexes. No cranial nerve deficit. He exhibits normal muscle tone. Coordination normal.  Skin: He is not diaphoretic.  Psychiatric: He has a normal mood and  affect. His behavior is normal. Judgment and thought content normal.  Vitals reviewed.        Assessment & Plan:  Benign essential HTN - Plan: CBC with Differential/Platelet, COMPLETE METABOLIC PANEL WITH GFR, Lipid panel  Pure hypercholesterolemia - Plan: CBC with Differential/Platelet, COMPLETE METABOLIC PANEL WITH GFR, Lipid panel  History of atrial fibrillation  History of prostate cancer - Plan: PSA   Blood pressure today is well controlled. I will make no changes olmesartan. I last the patient return fasting for fasting lipid panel. His goal LDL cholesterol is less than 100. Currently he is in  normal sinus rhythm. Rate is well controlled. He is a properly anticoagulated. I will last the patient return for a CBC and a CMP to evaluate for any anemia secondary to chronic anticoagulation as well as any renal or problems. I will also screen and PSA to evaluate for any recurrence of his prostate cancer

## 2016-07-29 ENCOUNTER — Other Ambulatory Visit: Payer: Medicare Other

## 2016-07-29 DIAGNOSIS — Z8546 Personal history of malignant neoplasm of prostate: Secondary | ICD-10-CM | POA: Diagnosis not present

## 2016-07-29 DIAGNOSIS — E7800 Pure hypercholesterolemia, unspecified: Secondary | ICD-10-CM | POA: Diagnosis not present

## 2016-07-29 DIAGNOSIS — I10 Essential (primary) hypertension: Secondary | ICD-10-CM | POA: Diagnosis not present

## 2016-07-29 LAB — LIPID PANEL
CHOL/HDL RATIO: 6.8 ratio — AB (ref <5.0)
Cholesterol: 170 mg/dL (ref <200)
HDL: 25 mg/dL — ABNORMAL LOW (ref >40)
LDL Cholesterol: 117 mg/dL — ABNORMAL HIGH (ref <100)
Triglycerides: 138 mg/dL (ref <150)
VLDL: 28 mg/dL (ref <30)

## 2016-07-29 LAB — COMPLETE METABOLIC PANEL WITH GFR
ALT: 23 U/L (ref 9–46)
AST: 20 U/L (ref 10–35)
Albumin: 4 g/dL (ref 3.6–5.1)
Alkaline Phosphatase: 54 U/L (ref 40–115)
BUN: 15 mg/dL (ref 7–25)
CHLORIDE: 106 mmol/L (ref 98–110)
CO2: 26 mmol/L (ref 20–31)
Calcium: 9.3 mg/dL (ref 8.6–10.3)
Creat: 0.98 mg/dL (ref 0.70–1.18)
GFR, Est African American: 88 mL/min (ref >=60)
GFR, Est Non African American: 76 mL/min (ref >=60)
GLUCOSE: 99 mg/dL (ref 70–99)
POTASSIUM: 4.9 mmol/L (ref 3.5–5.3)
SODIUM: 140 mmol/L (ref 135–146)
Total Bilirubin: 0.5 mg/dL (ref 0.2–1.2)
Total Protein: 6.6 g/dL (ref 6.1–8.1)

## 2016-07-29 LAB — CBC WITH DIFFERENTIAL/PLATELET
Basophils Absolute: 48 cells/uL (ref 0–200)
Basophils Relative: 1 %
EOS PCT: 2 %
Eosinophils Absolute: 96 cells/uL (ref 15–500)
HCT: 47.9 % (ref 38.5–50.0)
Hemoglobin: 15.7 g/dL (ref 13.0–17.0)
LYMPHS ABS: 1200 {cells}/uL (ref 850–3900)
LYMPHS PCT: 25 %
MCH: 30.1 pg (ref 27.0–33.0)
MCHC: 32.8 g/dL (ref 32.0–36.0)
MCV: 91.9 fL (ref 80.0–100.0)
MPV: 9.8 fL (ref 7.5–12.5)
Monocytes Absolute: 432 cells/uL (ref 200–950)
Monocytes Relative: 9 %
NEUTROS PCT: 63 %
Neutro Abs: 3024 cells/uL (ref 1500–7800)
Platelets: 229 10*3/uL (ref 140–400)
RBC: 5.21 MIL/uL (ref 4.20–5.80)
RDW: 13.6 % (ref 11.0–15.0)
WBC: 4.8 10*3/uL (ref 3.8–10.8)

## 2016-07-30 LAB — PSA: PSA: <0.1 ng/mL (ref <=4.0)

## 2016-07-30 NOTE — Telephone Encounter (Signed)
**Note De-Identified [name] [name] Obfuscation** [name] [name] from [place] called with questions concerning the pts Pradaxa PA. Questions answered and [name] [name] states that he will forward answers to the decision making board and that he will call back if he has any further questions.

## 2016-07-31 ENCOUNTER — Telehealth: Payer: Self-pay | Admitting: *Deleted

## 2016-07-31 NOTE — Telephone Encounter (Signed)
[employer] called regarding the appeal that was sent in for a lower tier cost for [facility], it was denied, patient was notified by [facility]

## 2016-08-27 ENCOUNTER — Encounter: Payer: Self-pay | Admitting: Family Medicine

## 2016-08-27 ENCOUNTER — Ambulatory Visit (INDEPENDENT_AMBULATORY_CARE_PROVIDER_SITE_OTHER): Payer: Medicare Other | Admitting: Family Medicine

## 2016-08-27 VITALS — BP 126/80 | HR 56 | Temp 98.1°F | Resp 14 | Ht 70.0 in | Wt 223.0 lb

## 2016-08-27 DIAGNOSIS — Z8546 Personal history of malignant neoplasm of prostate: Secondary | ICD-10-CM | POA: Diagnosis not present

## 2016-08-27 DIAGNOSIS — Z8679 Personal history of other diseases of the circulatory system: Secondary | ICD-10-CM | POA: Diagnosis not present

## 2016-08-27 DIAGNOSIS — I10 Essential (primary) hypertension: Secondary | ICD-10-CM

## 2016-08-27 DIAGNOSIS — E7800 Pure hypercholesterolemia, unspecified: Secondary | ICD-10-CM

## 2016-08-27 DIAGNOSIS — Z0000 Encounter for general adult medical examination without abnormal findings: Secondary | ICD-10-CM | POA: Diagnosis not present

## 2016-08-27 MED ORDER — LOSARTAN POTASSIUM 50 MG PO TABS: 100.0000 mg | ORAL_TABLET | Freq: Every day | ORAL | 5 refills | Status: DC

## 2016-08-27 NOTE — Progress Notes (Signed)
 Subjective:    Patient ID: [name] [name], male    DOB: 07/09/1945, 71 y.o.   MRN: [id number]  HPI Patient is here today for complete physical exam. He is long overdue for colonoscopy but he refuses a colonoscopy. However he was still like screening for colon cancer. After a long discussion, the patient would like to be registered for Cologuard.  We will facilitate this. He has a history of prostate cancer status post prostatectomy. His most recent PSA was undetectable at less than 0.1. He is due for Prevnar 13. Pneumovax 23 is up-to-date. He declines the shingles vaccine and a tetanus shot. His most recent labs are listed below No visits with results within 1 Month(s) from this visit.  Latest known visit with results is:  Office Visit on 07/26/2016  Component Date Value Ref Range Status  . WBC 07/29/2016 4.8  3.8 - 10.8 K/uL Final  . RBC 07/29/2016 5.21  4.20 - 5.80 MIL/uL Final  . Hemoglobin 07/29/2016 15.7  13.0 - 17.0 g/dL Final  . HCT 07/29/2016 47.9  38.5 - 50.0 % Final  . MCV 07/29/2016 91.9  80.0 - 100.0 fL Final  . MCH 07/29/2016 30.1  27.0 - 33.0 pg Final  . MCHC 07/29/2016 32.8  32.0 - 36.0 g/dL Final  . RDW 07/29/2016 13.6  11.0 - 15.0 % Final  . Platelets 07/29/2016 229  140 - 400 K/uL Final  . MPV 07/29/2016 9.8  7.5 - 12.5 fL Final  . Neutro Abs 07/29/2016 3024  1,500 - 7,800 cells/uL Final  . Lymphs Abs 07/29/2016 1200  850 - 3,900 cells/uL Final  . Monocytes Absolute 07/29/2016 432  200 - 950 cells/uL Final  . Eosinophils Absolute 07/29/2016 96  15 - 500 cells/uL Final  . Basophils Absolute 07/29/2016 48  0 - 200 cells/uL Final  . Neutrophils Relative % 07/29/2016 63  % Final  . Lymphocytes Relative 07/29/2016 25  % Final  . Monocytes Relative 07/29/2016 9  % Final  . Eosinophils Relative 07/29/2016 2  % Final  . Basophils Relative 07/29/2016 1  % Final  . Smear Review 07/29/2016 Criteria for review not met   Final  . Sodium 07/29/2016 140  135 - 146 mmol/L Final  .  Potassium 07/29/2016 4.9  3.5 - 5.3 mmol/L Final  . Chloride 07/29/2016 106  98 - 110 mmol/L Final  . CO2 07/29/2016 26  20 - 31 mmol/L Final  . Glucose, Bld 07/29/2016 99  70 - 99 mg/dL Final  . BUN 07/29/2016 15  7 - 25 mg/dL Final  . Creat 07/29/2016 0.98  0.70 - 1.18 mg/dL Final   Comment:   For patients > or = 73 years of age: The upper reference limit for Creatinine is approximately 13% higher for people identified as African-American.     . Total Bilirubin 07/29/2016 0.5  0.2 - 1.2 mg/dL Final  . Alkaline Phosphatase 07/29/2016 54  40 - 115 U/L Final  . AST 07/29/2016 20  10 - 35 U/L Final  . ALT 07/29/2016 23  9 - 46 U/L Final  . Total Protein 07/29/2016 6.6  6.1 - 8.1 g/dL Final  . Albumin 07/29/2016 4.0  3.6 - 5.1 g/dL Final  . Calcium 07/29/2016 9.3  8.6 - 10.3 mg/dL Final  . GFR, Est African American 07/29/2016 88  >=60 mL/min Final  . GFR, Est Non African American 07/29/2016 76  >=60 mL/min Final  . Cholesterol 07/29/2016 170  <200 mg/dL Final  .   Triglycerides 07/29/2016 138  <150 mg/dL Final  . HDL 07/29/2016 25* >40 mg/dL Final  . Total CHOL/HDL Ratio 07/29/2016 6.8* <5.0 Ratio Final  . VLDL 07/29/2016 28  <30 mg/dL Final  . LDL Cholesterol 07/29/2016 117* <100 mg/dL Final  . PSA 07/29/2016 <0.1  <=4.0 ng/mL Final   Comment:   The total PSA value from this assay system is standardized against the WHO standard. The test result will be approximately 20% lower when compared to the equimolar-standardized total PSA (Beckman Coulter). Comparison of serial PSA results should be interpreted with this fact in mind.   This test was performed using the Siemens chemiluminescent method. Values obtained from different assay methods cannot be used interchangeably. PSA levels, regardless of value, should not be interpreted as absolute evidence of the presence or absence of disease.   The current order code 23780 has a lower limit of quantification of 0.1 ng/mL. For  post-prostatectomy patients, please use order code 14808 (PSA, Post-Prostatectomy), which can be added on within 5 days of date of collection. The lower limit of accurate quantification for order code 14808 is 0.02 ng/mL. PSA values less than 0.02 ng/mL cannot be accurately measured and will be reported as <0.02 ng/mL. Specimens with PSA leve                          ls below the lower limit of accurate quantification should be considered as negative.    Past Medical History:  Diagnosis Date  . CVA (cerebral infarction) 07-14-09  . Hyperlipidemia   . Hypertension   . Nephrolithiasis   . Paroxysmal atrial fibrillation ([facility])   . Paroxysmal atrial flutter ([facility])    a. s/p ablation 2012.  . Prostate cancer ([facility])    Past Surgical History:  Procedure Laterality Date  . APPENDECTOMY    . BACK SURGERY    . PROSTATECTOMY  2005   Current Outpatient Prescriptions on File Prior to Visit  Medication Sig Dispense Refill  . flecainide (TAMBOCOR) 150 MG tablet Take 150 mg by mouth 2 (two) times daily.   3  . Multiple Vitamins-Minerals (CENTRUM SILVER ADULT 50+ PO) Take by mouth.    . rivaroxaban (XARELTO) 20 MG TABS tablet Take 1 tablet (20 mg total) by mouth daily with supper. 30 tablet 0  . rosuvastatin (CRESTOR) 40 MG tablet Take 1 tablet (40 mg total) by mouth daily. 90 tablet 3  . traMADol (ULTRAM) 50 MG tablet Take 1 tablet (50 mg total) by mouth every 6 (six) hours as needed. 120 tablet 0   No current facility-administered medications on file prior to visit.    No Known Allergies Social History   Social History  . Marital status: Married    Spouse name: N/A  . Number of children: 2  . 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 Smoking status: Never Smoker  . Smokeless tobacco: Never Used  . Alcohol use No  . Drug use: No  . Sexual activity: Not on file   Other Topics Concern  . Not on file   Social  History Narrative  . No narrative on file   Family History  Problem Relation Age of Onset  . Heart disease Mother 58       died  . Prostate cancer Father        alive  . Testicular cancer Brother          died      Review of Systems  All other systems reviewed and are negative.      Objective:   Physical Exam  Constitutional: He is oriented to person, place, and time. He appears well-developed and well-nourished. No distress.  HENT:  Head: Normocephalic and atraumatic.  Right Ear: External ear normal.  Left Ear: External ear normal.  Nose: Nose normal.  Mouth/Throat: Oropharynx is clear and moist. No oropharyngeal exudate.  Eyes: Conjunctivae and EOM are normal. Pupils are equal, round, and reactive to light. Right eye exhibits no discharge. Left eye exhibits no discharge. No scleral icterus.  Neck: Normal range of motion. Neck supple. No JVD present. No tracheal deviation present. No thyromegaly present.  Cardiovascular: Normal rate, regular rhythm, normal heart sounds and intact distal pulses.  Exam reveals no gallop and no friction rub.   No murmur heard. Pulmonary/Chest: Effort normal and breath sounds normal. No stridor. No respiratory distress. He has no wheezes. He has no rales. He exhibits no tenderness.  Abdominal: Soft. Bowel sounds are normal. He exhibits no distension. There is no tenderness. There is no rebound and no guarding.  Musculoskeletal: Normal range of motion. He exhibits no edema or tenderness.  Lymphadenopathy:    He has no cervical adenopathy.  Neurological: He is alert and oriented to person, place, and time. He has normal reflexes. No cranial nerve deficit. He exhibits normal muscle tone. Coordination normal.  Skin: Skin is warm. No rash noted. He is not diaphoretic. No erythema. No pallor.  Psychiatric: He has a normal mood and affect. His behavior is normal. Judgment and thought content normal.  Vitals reviewed.         Assessment & Plan:    Pure hypercholesterolemia  Benign essential HTN - Plan: losartan (COZAAR) 50 MG tablet  History of atrial fibrillation  History of prostate cancer  Routine general medical examination at a health care facility I recommended a colonoscopy but the patient declined. He will let me schedule him for cologuard stool assay.  PSA is undetectable. Patient received Prevnar 13 today in office. Review his lab work shows elevated LDL cholesterol. However patient is on max dose Crestor and has no desire for PKS 9 inhibitor or Zetia.  Regular anticipatory guidance is provided. Recommended increasing losartan to 50 mg twice a day to better control his blood pressure.

## 2016-10-22 ENCOUNTER — Other Ambulatory Visit: Payer: Self-pay | Admitting: Family Medicine

## 2016-10-22 NOTE — Telephone Encounter (Signed)
ok 

## 2016-10-22 NOTE — Telephone Encounter (Signed)
Ok to refill 

## 2016-11-13 ENCOUNTER — Other Ambulatory Visit: Payer: Self-pay | Admitting: Family Medicine

## 2016-11-13 DIAGNOSIS — I10 Essential (primary) hypertension: Secondary | ICD-10-CM

## 2016-12-21 ENCOUNTER — Other Ambulatory Visit: Payer: Self-pay | Admitting: Family Medicine

## 2016-12-21 ENCOUNTER — Other Ambulatory Visit: Payer: Self-pay | Admitting: Internal Medicine

## 2016-12-23 NOTE — Telephone Encounter (Signed)
Last OV 5/22 Last refill 7/17 Okay to refill

## 2016-12-23 NOTE — Telephone Encounter (Signed)
ok 

## 2016-12-24 DIAGNOSIS — C61 Malignant neoplasm of prostate: Secondary | ICD-10-CM | POA: Diagnosis not present

## 2016-12-24 DIAGNOSIS — N200 Calculus of kidney: Secondary | ICD-10-CM | POA: Diagnosis not present

## 2016-12-24 DIAGNOSIS — R1032 Left lower quadrant pain: Secondary | ICD-10-CM | POA: Diagnosis not present

## 2016-12-24 DIAGNOSIS — N202 Calculus of kidney with calculus of ureter: Secondary | ICD-10-CM | POA: Diagnosis not present

## 2016-12-30 ENCOUNTER — Other Ambulatory Visit: Payer: Self-pay | Admitting: *Deleted

## 2016-12-30 MED ORDER — RIVAROXABAN 20 MG PO TABS: 20.0000 mg | ORAL_TABLET | Freq: Every day | ORAL | 6 refills | Status: DC

## 2016-12-30 NOTE — Telephone Encounter (Signed)
Refill request received for Xarelto 20mg ; pt is 71 yrs old, Wt-101.2kg, Crea-0.98 on 07/29/16, last seen by Dr. Lovena Le on 01/23/16, CrCl-96.101ml/min. Will send in refill request to requested pharmacy.

## 2017-01-01 ENCOUNTER — Telehealth: Payer: Self-pay | Admitting: Family Medicine

## 2017-01-01 NOTE — Telephone Encounter (Signed)
Pharm requesting a refill on tramadol - Ok to refill??      Last refill  -  10/22/16

## 2017-01-02 MED ORDER — TRAMADOL HCL 50 MG PO TABS: 50.0000 mg | ORAL_TABLET | Freq: Four times a day (QID) | ORAL | 0 refills | Status: DC | PRN

## 2017-01-02 NOTE — Telephone Encounter (Signed)
Medication called/sent to requested pharmacy  

## 2017-01-02 NOTE — Telephone Encounter (Signed)
ok 

## 2017-03-21 ENCOUNTER — Ambulatory Visit: Payer: Medicare Other | Admitting: Internal Medicine

## 2017-04-08 ENCOUNTER — Other Ambulatory Visit: Payer: Self-pay | Admitting: Internal Medicine

## 2017-05-12 DIAGNOSIS — L570 Actinic keratosis: Secondary | ICD-10-CM | POA: Diagnosis not present

## 2017-05-12 DIAGNOSIS — D225 Melanocytic nevi of trunk: Secondary | ICD-10-CM | POA: Diagnosis not present

## 2017-05-12 DIAGNOSIS — L821 Other seborrheic keratosis: Secondary | ICD-10-CM | POA: Diagnosis not present

## 2017-05-12 DIAGNOSIS — L304 Erythema intertrigo: Secondary | ICD-10-CM | POA: Diagnosis not present

## 2017-05-12 DIAGNOSIS — D1801 Hemangioma of skin and subcutaneous tissue: Secondary | ICD-10-CM | POA: Diagnosis not present

## 2017-05-12 DIAGNOSIS — D485 Neoplasm of uncertain behavior of skin: Secondary | ICD-10-CM | POA: Diagnosis not present

## 2017-05-12 DIAGNOSIS — L820 Inflamed seborrheic keratosis: Secondary | ICD-10-CM | POA: Diagnosis not present

## 2017-05-12 DIAGNOSIS — L218 Other seborrheic dermatitis: Secondary | ICD-10-CM | POA: Diagnosis not present

## 2017-05-12 DIAGNOSIS — D224 Melanocytic nevi of scalp and neck: Secondary | ICD-10-CM | POA: Diagnosis not present

## 2017-05-21 ENCOUNTER — Encounter: Payer: Self-pay | Admitting: Internal Medicine

## 2017-05-21 ENCOUNTER — Encounter (INDEPENDENT_AMBULATORY_CARE_PROVIDER_SITE_OTHER): Payer: Self-pay

## 2017-05-21 ENCOUNTER — Ambulatory Visit (INDEPENDENT_AMBULATORY_CARE_PROVIDER_SITE_OTHER): Payer: Medicare Other | Admitting: Internal Medicine

## 2017-05-21 VITALS — BP 124/80 | HR 49 | Ht 70.0 in | Wt 225.0 lb

## 2017-05-21 DIAGNOSIS — I4892 Unspecified atrial flutter: Secondary | ICD-10-CM | POA: Diagnosis not present

## 2017-05-21 DIAGNOSIS — I480 Paroxysmal atrial fibrillation: Secondary | ICD-10-CM | POA: Diagnosis not present

## 2017-05-21 NOTE — Patient Instructions (Signed)

## 2017-05-21 NOTE — Progress Notes (Signed)
HPI [name] returns today for followup of his PAF and sinus node dysfunction. 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 He has had less than 5 episodes of atrial fib in the last year with the longest about a day. He has no limitations to activity. He admits to occaisional non-compliance in that he will miss his meds. No Known Allergies   Current Outpatient Medications  Medication Sig Dispense Refill  . flecainide (TAMBOCOR) 150 MG tablet TAKE ONE TABLET BY MOUTH TWICE DAILY 60 tablet 2  . losartan (COZAAR) 50 MG tablet Take 50 mg by mouth daily.    . Multiple Vitamins-Minerals (CENTRUM SILVER ADULT 50+ PO) Take by mouth.    . rivaroxaban (XARELTO) 20 MG TABS tablet Take 1 tablet (20 mg total) by mouth daily with supper. 30 tablet 6  . rosuvastatin (CRESTOR) 40 MG tablet Take 1 tablet (40 mg total) by mouth daily. 90 tablet 3  . traMADol (ULTRAM) 50 MG tablet Take 1 tablet (50 mg total) by mouth every 6 (six) hours as needed. for pain 120 tablet 0   No current facility-administered medications for this visit.      Past Medical History:  Diagnosis Date  . CVA (cerebral infarction) 07-14-09  . Hyperlipidemia   . Hypertension   . Nephrolithiasis   . Paroxysmal atrial fibrillation (HCC)   . Paroxysmal atrial flutter ([place])    a. s/p ablation 2012.  . Prostate cancer ([place])     ROS:   All systems reviewed and negative except as noted in the HPI.   Past Surgical History:  Procedure Laterality Date  . APPENDECTOMY    . BACK SURGERY    . PROSTATECTOMY  2005     Family History  Problem Relation Age of Onset  . Heart disease Mother 58       died  . Prostate cancer Father        alive  . Testicular cancer Brother        died     Social History   Socioeconomic History  . Marital status: Married    Spouse name: Not on file  . Number of children: 2  . Years of education: Not on file  . Highest  education level: Not on file  Social Needs  . Financial resource strain: Not on file  . Food insecurity - worry: Not on file  . Food insecurity - inability: Not on file  . Transportation needs - medical: Not on file  . Transportation needs - non-medical: Not on file  Occupational History  . Occupation: Surveyor, quantity: [employer]  Tobacco Use  . Smoking status: Never Smoker  . Smokeless tobacco: Never Used  Substance and Sexual Activity  . Alcohol use: No  . Drug use: No  . Sexual activity: Not on file  Other Topics Concern  . Not on file  Social History Narrative  . Not on file     Ht 5\' 10"  (1.778 m)   Wt 225 lb (102.1 kg)   BMI 32.28 kg/m   Physical Exam:  Well appearing 72 yo man, NAD HEENT: Unremarkable Neck:  No JVD, no thyromegally Lymphatics:  No adenopathy Back:  No CVA tenderness Lungs:  Clear with no wheezes HEART:  Regular rate rhythm, no murmurs, no rubs, no clicks Abd:  soft, positive bowel sounds, no organomegally, no rebound,  no guarding Ext:  2 plus pulses, no edema, no cyanosis, no clubbing Skin:  No rashes no nodules Neuro:  CN II through XII intact, motor grossly intact  EKG - nsr with first degree AV block   Assess/Plan: 1. PAF - he is maintaining NSR very nicely on flecainide.  2. HTN - his blood pressure is fairly well controlled on losartan. He is encouraged not to miss any doses. 3. Sinus node dysfunction - his resting HR's in the high 40's but he is asymptomatic.  4. coags - he has had no bleeding on xarelto. Continue.  [name] [name].D.

## 2017-06-19 ENCOUNTER — Other Ambulatory Visit: Payer: Self-pay | Admitting: Family Medicine

## 2017-06-19 NOTE — Telephone Encounter (Signed)
Requesting refill    Tramadol  LOV: 08/27/16  LRF:  01/02/17

## 2017-07-07 ENCOUNTER — Other Ambulatory Visit: Payer: Self-pay | Admitting: *Deleted

## 2017-07-07 MED ORDER — RIVAROXABAN 20 MG PO TABS: 20.0000 mg | ORAL_TABLET | Freq: Every day | ORAL | 6 refills | Status: DC

## 2017-07-07 NOTE — Telephone Encounter (Signed)
Pt. Is a 72 yr old male who saw Dr Lovena Le on 05/21/17.Wt was 102.1Kg. Scr was 0.98 on 07/29/16. CrCl is 96 mL/min. Will refill Xarelto 20mg  QD.

## 2017-07-20 ENCOUNTER — Other Ambulatory Visit: Payer: Self-pay | Admitting: Internal Medicine

## 2017-08-14 ENCOUNTER — Other Ambulatory Visit: Payer: Self-pay | Admitting: Family Medicine

## 2017-08-31 ENCOUNTER — Other Ambulatory Visit: Payer: Self-pay | Admitting: Family Medicine

## 2017-09-02 NOTE — Telephone Encounter (Signed)
Requesting refill    Tramadol  LOV: 08/27/16  LRF:  06/19/17

## 2017-11-05 ENCOUNTER — Other Ambulatory Visit: Payer: Self-pay

## 2017-11-10 DIAGNOSIS — L720 Epidermal cyst: Secondary | ICD-10-CM | POA: Diagnosis not present

## 2017-11-10 DIAGNOSIS — D1801 Hemangioma of skin and subcutaneous tissue: Secondary | ICD-10-CM | POA: Diagnosis not present

## 2017-11-10 DIAGNOSIS — D225 Melanocytic nevi of trunk: Secondary | ICD-10-CM | POA: Diagnosis not present

## 2017-11-10 DIAGNOSIS — D485 Neoplasm of uncertain behavior of skin: Secondary | ICD-10-CM | POA: Diagnosis not present

## 2017-11-10 DIAGNOSIS — L853 Xerosis cutis: Secondary | ICD-10-CM | POA: Diagnosis not present

## 2017-11-10 DIAGNOSIS — L218 Other seborrheic dermatitis: Secondary | ICD-10-CM | POA: Diagnosis not present

## 2017-11-10 DIAGNOSIS — D224 Melanocytic nevi of scalp and neck: Secondary | ICD-10-CM | POA: Diagnosis not present

## 2017-11-29 ENCOUNTER — Other Ambulatory Visit: Payer: Self-pay | Admitting: Family Medicine

## 2017-12-01 NOTE — Telephone Encounter (Signed)
Requesting refill    Tramadol  LOV: 08/27/16  LRF:  09/02/17

## 2017-12-19 DIAGNOSIS — H43813 Vitreous degeneration, bilateral: Secondary | ICD-10-CM | POA: Diagnosis not present

## 2017-12-19 DIAGNOSIS — H35033 Hypertensive retinopathy, bilateral: Secondary | ICD-10-CM | POA: Diagnosis not present

## 2017-12-19 DIAGNOSIS — H40013 Open angle with borderline findings, low risk, bilateral: Secondary | ICD-10-CM | POA: Diagnosis not present

## 2017-12-19 DIAGNOSIS — Z961 Presence of intraocular lens: Secondary | ICD-10-CM | POA: Diagnosis not present

## 2018-02-09 ENCOUNTER — Other Ambulatory Visit: Payer: Self-pay | Admitting: Family Medicine

## 2018-02-09 DIAGNOSIS — I10 Essential (primary) hypertension: Secondary | ICD-10-CM

## 2018-02-20 ENCOUNTER — Other Ambulatory Visit: Payer: Self-pay

## 2018-02-27 ENCOUNTER — Other Ambulatory Visit: Payer: Self-pay | Admitting: Internal Medicine

## 2018-02-27 ENCOUNTER — Other Ambulatory Visit: Payer: Self-pay | Admitting: Family Medicine

## 2018-02-27 NOTE — Telephone Encounter (Signed)
Ok to refill??  Last office visit 08/27/2016.  Last refill 12/01/2017.

## 2018-03-02 ENCOUNTER — Other Ambulatory Visit: Payer: Medicare Other

## 2018-03-02 ENCOUNTER — Other Ambulatory Visit: Payer: Medicare Other | Admitting: *Deleted

## 2018-03-02 ENCOUNTER — Other Ambulatory Visit: Payer: Self-pay | Admitting: Pharmacist

## 2018-03-02 DIAGNOSIS — G459 Transient cerebral ischemic attack, unspecified: Secondary | ICD-10-CM | POA: Diagnosis not present

## 2018-03-02 DIAGNOSIS — I480 Paroxysmal atrial fibrillation: Secondary | ICD-10-CM

## 2018-03-02 LAB — BASIC METABOLIC PANEL
BUN/Creatinine Ratio: 18 (ref 10–24)
BUN: 18 mg/dL (ref 8–27)
CALCIUM: 8.8 mg/dL (ref 8.6–10.2)
CO2: 21 mmol/L (ref 20–29)
Chloride: 103 mmol/L (ref 96–106)
Creatinine, Ser: 1.01 mg/dL (ref 0.76–1.27)
GFR calc non Af Amer: 73 mL/min/{1.73_m2} (ref >59)
GFR, EST AFRICAN AMERICAN: 84 mL/min/{1.73_m2} (ref >59)
Glucose: 97 mg/dL (ref 65–99)
Potassium: 4 mmol/L (ref 3.5–5.2)
Sodium: 138 mmol/L (ref 134–144)

## 2018-03-02 LAB — LIPID PANEL
Chol/HDL Ratio: 4 ratio (ref 0.0–5.0)
Cholesterol, Total: 113 mg/dL (ref 100–199)
HDL: 28 mg/dL — ABNORMAL LOW (ref >39)
LDL Calculated: 70 mg/dL (ref 0–99)
Triglycerides: 75 mg/dL (ref 0–149)
VLDL Cholesterol Cal: 15 mg/dL (ref 5–40)

## 2018-03-02 LAB — CBC
HEMATOCRIT: 45.6 % (ref 37.5–51.0)
HEMOGLOBIN: 15.1 g/dL (ref 13.0–17.7)
MCH: 30.6 pg (ref 26.6–33.0)
MCHC: 33.1 g/dL (ref 31.5–35.7)
MCV: 93 fL (ref 79–97)
Platelets: 213 10*3/uL (ref 150–450)
RBC: 4.93 x10E6/uL (ref 4.14–5.80)
RDW: 12.7 % (ref 12.3–15.4)
WBC: 4.2 10*3/uL (ref 3.4–10.8)

## 2018-03-23 ENCOUNTER — Telehealth: Payer: Self-pay | Admitting: Internal Medicine

## 2018-03-23 NOTE — Telephone Encounter (Signed)
Returned call to Pt.  Advised Pt blood work was good.  Pt indicates understanding.

## 2018-03-23 NOTE — Telephone Encounter (Signed)
Follow Up:     Pt would like his last lab results from about 3 weeks ago. He said to when you call ask for [name] [name] please.

## 2018-03-23 NOTE — Telephone Encounter (Signed)
Notes recorded by Leeroy Bock, RPH on 03/04/2018 at 9:57 AM EST BMET and CBC checked for annual monitoring on Xarelto - labs stable and pt should continue current dose of Xarelto. Pt requested we add on lipid panel - labs look excellent and he should continue rosuvastatin 40mg  daily as well.  Please inform pt of results.

## 2018-03-25 ENCOUNTER — Other Ambulatory Visit: Payer: Self-pay | Admitting: Internal Medicine

## 2018-05-17 ENCOUNTER — Other Ambulatory Visit: Payer: Self-pay | Admitting: Family Medicine

## 2018-05-18 NOTE — Telephone Encounter (Signed)
Ok to refill??  Last office visit 08/27/2016.  Last refill 02/27/2018.

## 2018-06-01 ENCOUNTER — Encounter: Payer: Self-pay | Admitting: Family Medicine

## 2018-06-01 ENCOUNTER — Telehealth: Payer: Self-pay | Admitting: *Deleted

## 2018-06-01 ENCOUNTER — Ambulatory Visit (INDEPENDENT_AMBULATORY_CARE_PROVIDER_SITE_OTHER): Payer: Medicare Other | Admitting: Family Medicine

## 2018-06-01 ENCOUNTER — Other Ambulatory Visit: Payer: Self-pay | Admitting: Family Medicine

## 2018-06-01 VITALS — BP 120/80 | HR 96 | Temp 98.2°F | Resp 12 | Ht 70.0 in | Wt 227.0 lb

## 2018-06-01 DIAGNOSIS — E7800 Pure hypercholesterolemia, unspecified: Secondary | ICD-10-CM | POA: Diagnosis not present

## 2018-06-01 DIAGNOSIS — I10 Essential (primary) hypertension: Secondary | ICD-10-CM

## 2018-06-01 DIAGNOSIS — Z8679 Personal history of other diseases of the circulatory system: Secondary | ICD-10-CM

## 2018-06-01 DIAGNOSIS — Z0000 Encounter for general adult medical examination without abnormal findings: Secondary | ICD-10-CM

## 2018-06-01 DIAGNOSIS — Z8546 Personal history of malignant neoplasm of prostate: Secondary | ICD-10-CM | POA: Diagnosis not present

## 2018-06-01 DIAGNOSIS — Z23 Encounter for immunization: Secondary | ICD-10-CM | POA: Diagnosis not present

## 2018-06-01 NOTE — Progress Notes (Signed)
Subjective:    Patient ID: [name] [name], male    DOB: 12/07/1945, 73 y.o.   MRN: [id number]  HPI Patient is here today for complete physical exam.  I have not seen the patient since 2018.  Patient has had Pneumovax 23 on 2 separate occasions.  There is some confusion as to whether he has had Prevnar 13.  We have a documented in the chart however the patient believes he has not had that and would like a booster on it.  He declines the flu shot today.  He is overdue for a colonoscopy.  We had a long discussion about this today.  He declines a colonoscopy.  His previous colonoscopy he had a tortuous colon and it caused a tremendous amount of discomfort.  He is hesitant but would like to consider the Cologuard.  He has a past medical history of prostate cancer and is overdue to check a PSA. Past Medical History:  Diagnosis Date  . CVA (cerebral infarction) 07-14-09  . Hyperlipidemia   . Hypertension   . Nephrolithiasis   . Paroxysmal atrial fibrillation (HCC)   . Paroxysmal atrial flutter ([place])    a. s/p ablation 2012.  . Prostate cancer [facility])    Past Surgical History:  Procedure Laterality Date  . APPENDECTOMY    . BACK SURGERY    . PROSTATECTOMY  2005   Current Outpatient Medications on File Prior to Visit  Medication Sig Dispense Refill  . flecainide (TAMBOCOR) 150 MG tablet TAKE 1 TABLET BY MOUTH TWICE DAILY 180 tablet 3  . losartan (COZAAR) 50 MG tablet TAKE 1 TABLET BY MOUTH ONCE DAILY 90 tablet 3  . Multiple Vitamins-Minerals (CENTRUM SILVER ADULT 50+ PO) Take by mouth.    . rosuvastatin (CRESTOR) 40 MG tablet TAKE 1 TABLET BY MOUTH ONCE DAILY 90 tablet 0  . traMADol (ULTRAM) 50 MG tablet TAKE 1 TABLET BY MOUTH EVERY 6 HOURS AS NEEDED FOR PAIN 120 tablet 0  . XARELTO 20 MG TABS tablet TAKE 1 TABLET BY MOUTH ONCE DAILY WITH  SUPPER 30 tablet 5   No current facility-administered medications on file prior to visit.    No Known Allergies Social History   Socioeconomic History  .  Marital status: Married    Spouse name: Not on file  . Number of children: 2  . Years of education: Not on file  . Highest education level: Not on file  Occupational History  . Occupation: Surveyor, quantity: [employer]  . Financial resource strain: Not on file  . Food insecurity:    Worry: Not on file    Inability: Not on file  . Transportation needs:    Medical: Not on file    Non-medical: Not on file  Tobacco Use  . Smoking status: Never Smoker  . Smokeless tobacco: Never Used  Substance and Sexual Activity  . Alcohol use: No  . Drug use: No  . Sexual activity: Not on file  Lifestyle  . Physical activity:    Days per week: Not on file    Minutes per session: Not on file  . Stress: Not on file  Relationships  . Social connections:    Talks on phone: Not on file    Gets together: Not on file    Attends religious service: Not on file    Active member of club or organization: Not on file    Attends meetings of clubs or organizations: Not  on file    Relationship status: Not on file  . Intimate partner violence:    Fear of current or ex partner: Not on file    Emotionally abused: Not on file    Physically abused: Not on file    Forced sexual activity: Not on file  Other Topics Concern  . Not on file  Social History Narrative  . Not on file   Family History  Problem Relation Age of Onset  . Heart disease Mother 42       died  . Prostate cancer Father        alive  . Testicular cancer Brother        died      Review of Systems  All other systems reviewed and are negative.      Objective:   Physical Exam  Constitutional: He is oriented to person, place, and time. He appears well-developed and well-nourished. No distress.  HENT:  Head: Normocephalic and atraumatic.  Right Ear: External ear normal.  Left Ear: External ear normal.  Nose: Nose normal.  Mouth/Throat: Oropharynx is clear and moist. No oropharyngeal exudate.    Eyes: Pupils are equal, round, and reactive to light. Conjunctivae and EOM are normal. Right eye exhibits no discharge. Left eye exhibits no discharge. No scleral icterus.  Neck: Normal range of motion. Neck supple. No JVD present. No tracheal deviation present. No thyromegaly present.  Cardiovascular: Normal rate, normal heart sounds and intact distal pulses. An irregularly irregular rhythm present. Exam reveals no gallop and no friction rub.  No murmur heard. Pulmonary/Chest: Effort normal and breath sounds normal. No stridor. No respiratory distress. He has no wheezes. He has no rales. He exhibits no tenderness.  Abdominal: Soft. Bowel sounds are normal. He exhibits no distension. There is no abdominal tenderness. There is no rebound and no guarding.  Musculoskeletal: Normal range of motion.        General: No tenderness or edema.  Lymphadenopathy:    He has no cervical adenopathy.  Neurological: He is alert and oriented to person, place, and time. He has normal reflexes. No cranial nerve deficit. He exhibits normal muscle tone. Coordination normal.  Skin: Skin is warm. No rash noted. He is not diaphoretic. No erythema. No pallor.  Psychiatric: He has a normal mood and affect. His behavior is normal. Judgment and thought content normal.  Vitals reviewed.         Assessment & Plan:  Routine general medical examination at a health care facility  History of atrial fibrillation  Pure hypercholesterolemia  Benign essential HTN  History of prostate cancer I recommended a colonoscopy but the patient declined. He will let me schedule him for cologuard stool assay.  I will monitor his prostate cancer treatment with a PSA.  Patient had lab work in November through his cardiologist including a normal CBC, a normal CMP, and a fasting lipid panel that was outstanding aside from a low HDL cholesterol of 28.  I have recommended that we repeat his lab work again in May.  He received Prevnar 13  today.  He declined his flu shot.  He denies any problems with memory loss, falls, or depression.  He does report an occasional tremor in his right hand.  This usually occurs with activity.  There is no resting tremor that I can see today.  He denies any problems with balance.  He denies any falls.  He denies any memory loss.  There is no bradykinesia.  He  has a normal affect.  There is no masklike face ease.  He has a normal blink reflex therefore we will monitor the patient for now however it sounds like he may have an essential tremor.  The remainder of his physical exam is normal

## 2018-06-01 NOTE — Telephone Encounter (Signed)
Received verbal orders for Cologuard.   Order placed via [facility].   Cologuard (Order [phone])

## 2018-06-01 NOTE — Addendum Note (Signed)
Addended by: Shary Decamp B on: 06/01/2018 03:30 PM   Modules accepted: Orders

## 2018-06-02 LAB — PSA: PSA: <0.1 ng/mL (ref <=4.0)

## 2018-06-09 ENCOUNTER — Encounter: Payer: Self-pay | Admitting: Internal Medicine

## 2018-06-24 ENCOUNTER — Ambulatory Visit: Payer: Medicare Other | Admitting: Internal Medicine

## 2018-07-29 ENCOUNTER — Other Ambulatory Visit: Payer: Self-pay | Admitting: Internal Medicine

## 2018-08-25 ENCOUNTER — Other Ambulatory Visit: Payer: Self-pay | Admitting: Family Medicine

## 2018-08-25 NOTE — Telephone Encounter (Signed)
Requesting refill    Tramadol  LOV: 06/01/18  LRF:  05/18/18

## 2018-09-10 ENCOUNTER — Ambulatory Visit: Payer: Medicare Other | Admitting: Internal Medicine

## 2018-09-27 ENCOUNTER — Other Ambulatory Visit: Payer: Self-pay | Admitting: Internal Medicine

## 2018-09-28 NOTE — Telephone Encounter (Signed)
Pt is a 61 yom requesting xarelto 20mg . Pt has a wt of103kg, scr 1.01(03/02/18), lov w/ Cristopher Peru 05/21/17. Pt has a ccr of 59ml/min. Will grant refill for 23mos since pt is overdue to see dr but has an appt scheduled for august due to covid. DX Afib

## 2018-11-18 ENCOUNTER — Other Ambulatory Visit: Payer: Self-pay | Admitting: Internal Medicine

## 2018-11-27 ENCOUNTER — Ambulatory Visit (INDEPENDENT_AMBULATORY_CARE_PROVIDER_SITE_OTHER): Payer: Medicare Other | Admitting: Internal Medicine

## 2018-11-27 ENCOUNTER — Other Ambulatory Visit: Payer: Self-pay

## 2018-11-27 ENCOUNTER — Encounter: Payer: Self-pay | Admitting: Internal Medicine

## 2018-11-27 VITALS — BP 124/76 | HR 49 | Ht 70.0 in | Wt 212.0 lb

## 2018-11-27 DIAGNOSIS — I480 Paroxysmal atrial fibrillation: Secondary | ICD-10-CM

## 2018-11-27 DIAGNOSIS — I4892 Unspecified atrial flutter: Secondary | ICD-10-CM

## 2018-11-27 NOTE — Patient Instructions (Signed)
Medication Instructions:  Your physician recommends that you continue on your current medications as directed. Please refer to the Current Medication list given to you today.  Labwork: None ordered.  Testing/Procedures: None ordered.  Follow-Up: Your physician recommends that you schedule a follow-up appointment in:   12 months with Dr. Lovena Le   Any Other Special Instructions Will Be Listed Below (If Applicable).     If you need a refill on your cardiac medications before your next appointment, please call your pharmacy.

## 2018-11-27 NOTE — Progress Notes (Signed)
HPI [name] [name] returns today for followup of his PAF and sinus node dysfunction. 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 He has had 2 episodes of atrial fib in the last year with the longest about a day. He has no limitations to activity. He admits to occaisional non-compliance in that he will miss his meds. No Known Allergies   Current Outpatient Medications  Medication Sig Dispense Refill  . flecainide (TAMBOCOR) 150 MG tablet Take 1 tablet by mouth twice daily 180 tablet 0  . losartan (COZAAR) 50 MG tablet TAKE 1 TABLET BY MOUTH ONCE DAILY (Patient taking differently: Take 25 mg by mouth daily. ) 90 tablet 3  . Multiple Vitamins-Minerals (CENTRUM SILVER ADULT 50+ PO) Take by mouth.    . rosuvastatin (CRESTOR) 40 MG tablet TAKE 1 TABLET BY MOUTH ONCE DAILY. OFFICE VISIT REQUIRED BEFORE ANY FURTHER REFILLS CAN BE GIVEN 90 tablet 0  . traMADol (ULTRAM) 50 MG tablet TAKE 1 TABLET BY MOUTH EVERY 6 HOURS AS NEEDED FOR PAIN 120 tablet 0  . XARELTO 20 MG TABS tablet TAKE 1 TABLET BY MOUTH ONCE DAILY WITH SUPPER 90 tablet 0   No current facility-administered medications for this visit.      Past Medical History:  Diagnosis Date  . CVA (cerebral infarction) 07-14-09  . Hyperlipidemia   . Hypertension   . Nephrolithiasis   . Paroxysmal atrial fibrillation (HCC)   . Paroxysmal atrial flutter ([place])    a. s/p ablation 2012.  . Prostate cancer ([place])     ROS:   All systems reviewed and negative except as noted in the HPI.   Past Surgical History:  Procedure Laterality Date  . APPENDECTOMY    . BACK SURGERY    . PROSTATECTOMY  2005     Family History  Problem Relation Age of Onset  . Heart disease Mother 32       died  . Prostate cancer Father        alive  . Testicular cancer Brother        died     Social History   Socioeconomic History  . Marital status: Married    Spouse name: Not on file  .  Number of children: 2  . Years of education: Not on file  . Highest education level: Not on file  Occupational History  . Occupation: Surveyor, quantity: [employer]  . Financial resource strain: Not on file  . Food insecurity    Worry: Not on file    Inability: Not on file  . Transportation needs    Medical: Not on file    Non-medical: Not on file  Tobacco Use  . Smoking status: Never Smoker  . Smokeless tobacco: Never Used  Substance and Sexual Activity  . Alcohol use: No  . Drug use: No  . Sexual activity: Not on file  Lifestyle  . Physical activity    Days per week: Not on file    Minutes per session: Not on file  . Stress: Not on file  Relationships  . Social Herbalist on phone: Not on file    Gets together: Not on file    Attends religious service: Not on file    Active member of club or organization: Not on file    Attends meetings of clubs or organizations:  Not on file    Relationship status: Not on file  . Intimate partner violence    Fear of current or ex partner: Not on file    Emotionally abused: Not on file    Physically abused: Not on file    Forced sexual activity: Not on file  Other Topics Concern  . Not on file  Social History Narrative  . Not on file     BP 124/76   Pulse (!) 49   Ht 5\' 10"  (1.778 m)   Wt 212 lb (96.2 kg)   SpO2 98%   BMI 30.42 kg/m   Physical Exam:  Well appearing NAD HEENT: Unremarkable Neck:  No JVD, no thyromegally Lymphatics:  No adenopathy Back:  No CVA tenderness Lungs:  Clear with no wheezes HEART:  Regular rate rhythm, no murmurs, no rubs, no clicks Abd:  soft, positive bowel sounds, no organomegally, no rebound, no guarding Ext:  2 plus pulses, no edema, no cyanosis, no clubbing Skin:  No rashes no nodules Neuro:  CN II through XII intact, motor grossly intact  EKG - sinus bradycardia  Assess/Plan: 1. PAF - he is maintaining NSR. He will continue his flecainide.  2. Sinus node dysfunction - he is asymptomatic. We discussed the symptoms he might experience if his sinus node dysfunction worsens. 3. HTN - his blood pressure has been well controlled. We will follow. He has reduce his dose of losartan. 4. 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  Mikle Bosworth.D.

## 2018-12-24 DIAGNOSIS — H1045 Other chronic allergic conjunctivitis: Secondary | ICD-10-CM | POA: Diagnosis not present

## 2018-12-24 DIAGNOSIS — H00025 Hordeolum internum left lower eyelid: Secondary | ICD-10-CM | POA: Diagnosis not present

## 2018-12-24 DIAGNOSIS — Z961 Presence of intraocular lens: Secondary | ICD-10-CM | POA: Diagnosis not present

## 2018-12-24 DIAGNOSIS — H40013 Open angle with borderline findings, low risk, bilateral: Secondary | ICD-10-CM | POA: Diagnosis not present

## 2018-12-27 ENCOUNTER — Other Ambulatory Visit: Payer: Self-pay | Admitting: Internal Medicine

## 2018-12-28 NOTE — Telephone Encounter (Signed)
34m 96.2kg Scr 1.01 03/02/18 ccr 86 mlmin Lovw/taylor 11/27/18

## 2019-01-13 DIAGNOSIS — D225 Melanocytic nevi of trunk: Secondary | ICD-10-CM | POA: Diagnosis not present

## 2019-01-13 DIAGNOSIS — D1801 Hemangioma of skin and subcutaneous tissue: Secondary | ICD-10-CM | POA: Diagnosis not present

## 2019-01-13 DIAGNOSIS — L820 Inflamed seborrheic keratosis: Secondary | ICD-10-CM | POA: Diagnosis not present

## 2019-01-13 DIAGNOSIS — L570 Actinic keratosis: Secondary | ICD-10-CM | POA: Diagnosis not present

## 2019-02-27 ENCOUNTER — Other Ambulatory Visit: Payer: Self-pay | Admitting: Family Medicine

## 2019-03-01 NOTE — Telephone Encounter (Signed)
Ok to refill??  Last office visit 06/01/2018.  Last refill 08/25/2018.

## 2019-03-03 ENCOUNTER — Other Ambulatory Visit: Payer: Self-pay | Admitting: Family Medicine

## 2019-03-03 DIAGNOSIS — I10 Essential (primary) hypertension: Secondary | ICD-10-CM

## 2019-03-30 ENCOUNTER — Other Ambulatory Visit: Payer: Self-pay | Admitting: Internal Medicine

## 2019-03-30 DIAGNOSIS — I4891 Unspecified atrial fibrillation: Secondary | ICD-10-CM

## 2019-03-30 NOTE — Telephone Encounter (Addendum)
Xarelto 20mg  refill request received. Pt is 73 years old, weight-96.2kg, Crea-1.01 on 03/02/18-pt needs updated labs, last seen by Dr. Lovena Le on 11/08/2018, Diagnosis-Afib, CrCl-85.31ml/min; Dose is appropriate based on dosing criteria. Will send in refill to requested pharmacy.   Called pt and busy signal x 2; will try back later since pt will need labs.   Called pt and had to leave a message for the pt to call back.

## 2019-04-05 NOTE — Telephone Encounter (Signed)
Spoke with pt and he stated the pharmacy did give him a couple tablets until the prescription could be processed. He has enough for then next 3 days and pt is willing to have labs drawn tomorrow around 930am in the office. Made lab appt and will order labs for Xarelto follow up.  Also, pt request a 30 day supply only as he is in the donut hole and will request a 90 day supply in January when insurance restarts. Will send in a 30 day supply and pt has an appt for tomorrow for labs and given basic guidelines to come into the office for labs.  73yrs old, 96.2kg, Crea-1.01 from 03/02/2018 and has lab appt for tomorrow, saw Dr. Lovena Le on 11/08/2018, Afib, with current labs CrCl-85.48ml/min-sending in a 30 day supply only until labs return and pt is aware.

## 2019-04-06 ENCOUNTER — Other Ambulatory Visit: Payer: Self-pay

## 2019-04-06 ENCOUNTER — Other Ambulatory Visit: Payer: Medicare Other | Admitting: *Deleted

## 2019-04-06 DIAGNOSIS — I4891 Unspecified atrial fibrillation: Secondary | ICD-10-CM

## 2019-04-06 LAB — CBC
Hematocrit: 48.3 % (ref 37.5–51.0)
Hemoglobin: 16.4 g/dL (ref 13.0–17.7)
MCH: 31.6 pg (ref 26.6–33.0)
MCHC: 34 g/dL (ref 31.5–35.7)
MCV: 93 fL (ref 79–97)
Platelets: 210 10*3/uL (ref 150–450)
RBC: 5.19 x10E6/uL (ref 4.14–5.80)
RDW: 12.2 % (ref 11.6–15.4)
WBC: 5.4 10*3/uL (ref 3.4–10.8)

## 2019-04-06 LAB — BASIC METABOLIC PANEL
BUN/Creatinine Ratio: 13 (ref 10–24)
BUN: 14 mg/dL (ref 8–27)
CO2: 23 mmol/L (ref 20–29)
Calcium: 9.2 mg/dL (ref 8.6–10.2)
Chloride: 104 mmol/L (ref 96–106)
Creatinine, Ser: 1.12 mg/dL (ref 0.76–1.27)
GFR calc Af Amer: 74 mL/min/{1.73_m2} (ref >59)
GFR calc non Af Amer: 64 mL/min/{1.73_m2} (ref >59)
Glucose: 115 mg/dL — ABNORMAL HIGH (ref 65–99)
Potassium: 4.5 mmol/L (ref 3.5–5.2)
Sodium: 142 mmol/L (ref 134–144)

## 2019-04-27 ENCOUNTER — Encounter: Payer: Self-pay | Admitting: Family Medicine

## 2019-05-01 DIAGNOSIS — Z23 Encounter for immunization: Secondary | ICD-10-CM | POA: Diagnosis not present

## 2019-05-03 ENCOUNTER — Other Ambulatory Visit: Payer: Self-pay | Admitting: Internal Medicine

## 2019-05-03 NOTE — Telephone Encounter (Signed)
Xarelto 20mg  refill request received. Pt is 74 years old, weight-96.2kg, Crea-1.12 on 04/06/2019, last seen by Dr. Lovena Le on 11/27/2018, Diagnosis-Afib, CrCl-77.35ml/min; Dose is appropriate based on dosing criteria. Will send in refill to requested pharmacy.

## 2019-05-17 ENCOUNTER — Other Ambulatory Visit: Payer: Self-pay

## 2019-05-17 ENCOUNTER — Encounter: Payer: Self-pay | Admitting: Family Medicine

## 2019-05-17 ENCOUNTER — Ambulatory Visit (INDEPENDENT_AMBULATORY_CARE_PROVIDER_SITE_OTHER): Payer: Medicare Other | Admitting: Family Medicine

## 2019-05-17 VITALS — BP 130/80 | HR 48 | Temp 96.6°F | Resp 16 | Ht 70.0 in

## 2019-05-17 DIAGNOSIS — H16141 Punctate keratitis, right eye: Secondary | ICD-10-CM | POA: Diagnosis not present

## 2019-05-17 DIAGNOSIS — H18221 Idiopathic corneal edema, right eye: Secondary | ICD-10-CM | POA: Diagnosis not present

## 2019-05-17 DIAGNOSIS — H538 Other visual disturbances: Secondary | ICD-10-CM | POA: Diagnosis not present

## 2019-05-17 NOTE — Progress Notes (Signed)
Subjective:    Patient ID: [name] [name], male    DOB: Dec 30, 1945, 74 y.o.   MRN: [id number]  HPI  Patient is a 74 year old Caucasian male with a history of paroxysmal atrial fibrillation who presents today with sudden onset of blurry vision in both eyes per his report.  He has a history of a lens implant in his left eye approximately 3 years ago.  He states that he always has blurry vision in his left eye at a distance ever since then however this morning he awoke this morning with sudden onset of blurry vision in both eyes.  Is more pronounced in his right.  He states that he is having a difficult time reading the TV.  He denies any diplopia.  There is no conjunctival erythema.  There is no exudate in the eye.  Extraocular movements are intact.  There is no evidence of strabismus.  Pupils are equal round reactive to light.  There is no eye pain.  I am unable to perform an accurate funduscopic examination due to the light in my exam room Past Medical History:  Diagnosis Date  . CVA (cerebral infarction) 07-14-09  . Hyperlipidemia   . Hypertension   . Nephrolithiasis   . Paroxysmal atrial fibrillation (HCC)   . Paroxysmal atrial flutter ([place])    a. s/p ablation 2012.  . Prostate cancer [facility])    Past Surgical History:  Procedure Laterality Date  . APPENDECTOMY    . BACK SURGERY    . PROSTATECTOMY  2005   Current Outpatient Medications on File Prior to Visit  Medication Sig Dispense Refill  . flecainide (TAMBOCOR) 150 MG tablet Take 1 tablet by mouth twice daily 180 tablet 2  . losartan (COZAAR) 50 MG tablet Take 1 tablet by mouth once daily 90 tablet 0  . Multiple Vitamins-Minerals (CENTRUM SILVER ADULT 50+ PO) Take by mouth.    . rosuvastatin (CRESTOR) 40 MG tablet TAKE 1 TABLET BY MOUTH ONCE DAILY. OFFICE VISIT REQUIRED BEFORE ANY FURTHER REFILLS CAN BE GIVEN 90 tablet 0  . traMADol (ULTRAM) 50 MG tablet TAKE 1 TABLET BY MOUTH EVERY 6 HOURS AS NEEDED FOR PAIN 120 tablet 0  . XARELTO  20 MG TABS tablet TAKE 1 TABLET BY MOUTH ONCE DAILY WITH SUPPER 30 tablet 8   No current facility-administered medications on file prior to visit.   No Known Allergies Social History   Socioeconomic History  . Marital status: Married    Spouse name: Not on file  . Number of children: 2  . Years of education: Not on file  . Highest education level: Not on file  Occupational History  . Occupation: Surveyor, quantity: [employer]  Tobacco Use  . Smoking status: Never Smoker  . Smokeless tobacco: Never Used  Substance and Sexual Activity  . Alcohol use: No  . Drug use: No  . Sexual activity: Not on file  Other Topics Concern  . Not on file  Social History Narrative  . Not on file   Social Determinants of Health   Financial Resource Strain:   . Difficulty of Paying Living Expenses: Not on file  Food Insecurity:   . Worried About Charity fundraiser in the Last Year: Not on file  . Ran Out of Food in the Last Year: Not on file  Transportation Needs:   . Lack of Transportation (Medical): Not on file  . Lack of Transportation (Non-Medical): Not on file  Physical  Activity:   . Days of Exercise per Week: Not on file  . Minutes of Exercise per Session: Not on file  Stress:   . Feeling of Stress : Not on file  Social Connections:   . Frequency of Communication with Friends and Family: Not on file  . Frequency of Social Gatherings with Friends and Family: Not on file  . Attends Religious Services: Not on file  . Active Member of Clubs or Organizations: Not on file  . Attends Archivist Meetings: Not on file  . Marital Status: Not on file  Intimate Partner Violence:   . Fear of Current or Ex-Partner: Not on file  . Emotionally Abused: Not on file  . Physically Abused: Not on file  . Sexually Abused: Not on file    Review of Systems     Objective:   Physical Exam Vitals reviewed.  Constitutional:      General: He is not in acute distress.     Appearance: Normal appearance. He is not ill-appearing or toxic-appearing.  Eyes:     General: Lids are normal. No visual field deficit.       Right eye: No discharge.        Left eye: No discharge.     Extraocular Movements: Extraocular movements intact.     Right eye: Normal extraocular motion and no nystagmus.     Left eye: Normal extraocular motion and no nystagmus.     Conjunctiva/sclera: Conjunctivae normal.     Right eye: Right conjunctiva is not injected. No chemosis or exudate.    Left eye: Left conjunctiva is not injected. No chemosis or exudate.    Pupils: Pupils are equal, round, and reactive to light.  Cardiovascular:     Rate and Rhythm: Normal rate and regular rhythm.     Heart sounds: No murmur. No gallop.   Pulmonary:     Effort: Pulmonary effort is normal. No respiratory distress.     Breath sounds: Normal breath sounds. No wheezing or rales.  Neurological:     General: No focal deficit present.     Mental Status: He is alert and oriented to person, place, and time.     Cranial Nerves: No cranial nerve deficit.     Sensory: No sensory deficit.     Motor: No weakness.     Coordination: Coordination normal.     Gait: Gait normal.           Assessment & Plan:  Blurry vision, bilateral  Patient reports sudden onset of blurry vision in both eyes however his left eye is 20/70 in his right is 20/40.  I am concerned about acute maculopathy in the left eye.  Given the patient's previous history of atrial fibrillation, I am concerned about possible embolic stroke causing artery occlusion damaging part of the retina.  Patient needs funduscopic exam as soon as possible.  Patient denies any cotton-wool spots in his vision or "descending curtains".  Therefore I do not feel that this is a retinal tear or a retinal detachment.  However my limited funduscopic exam is not adequate to rule this out.  I have called his ophthalmologist.  He will be seeing Dr. Kathlen Mody at  2:10.  I will  check lab work to rule out hyperglycemia.  However the patient denies sudden weight loss, polyuria, or polydipsia.

## 2019-05-18 LAB — CBC WITH DIFFERENTIAL/PLATELET
Absolute Monocytes: 615 cells/uL (ref 200–950)
Basophils Absolute: 50 cells/uL (ref 0–200)
Basophils Relative: 1 %
Eosinophils Absolute: 60 cells/uL (ref 15–500)
Eosinophils Relative: 1.2 %
HCT: 49 % (ref 38.5–50.0)
Hemoglobin: 16.4 g/dL (ref 13.2–17.1)
Lymphs Abs: 905 cells/uL (ref 850–3900)
MCH: 30.7 pg (ref 27.0–33.0)
MCHC: 33.5 g/dL (ref 32.0–36.0)
MCV: 91.8 fL (ref 80.0–100.0)
MPV: 10.5 fL (ref 7.5–12.5)
Monocytes Relative: 12.3 %
Neutro Abs: 3370 cells/uL (ref 1500–7800)
Neutrophils Relative %: 67.4 %
Platelets: 216 10*3/uL (ref 140–400)
RBC: 5.34 10*6/uL (ref 4.20–5.80)
RDW: 12.4 % (ref 11.0–15.0)
Total Lymphocyte: 18.1 %
WBC: 5 10*3/uL (ref 3.8–10.8)

## 2019-05-18 LAB — COMPLETE METABOLIC PANEL WITH GFR
AG Ratio: 1.8 (calc) (ref 1.0–2.5)
ALT: 17 U/L (ref 9–46)
AST: 15 U/L (ref 10–35)
Albumin: 4.4 g/dL (ref 3.6–5.1)
Alkaline phosphatase (APISO): 64 U/L (ref 35–144)
BUN: 17 mg/dL (ref 7–25)
CO2: 28 mmol/L (ref 20–32)
Calcium: 9.5 mg/dL (ref 8.6–10.3)
Chloride: 105 mmol/L (ref 98–110)
Creat: 0.94 mg/dL (ref 0.70–1.18)
GFR, Est African American: 91 mL/min/{1.73_m2} (ref >=60)
GFR, Est Non African American: 78 mL/min/{1.73_m2} (ref >=60)
Globulin: 2.5 g/dL (calc) (ref 1.9–3.7)
Glucose, Bld: 84 mg/dL (ref 65–99)
Potassium: 4.5 mmol/L (ref 3.5–5.3)
Sodium: 140 mmol/L (ref 135–146)
Total Bilirubin: 0.6 mg/dL (ref 0.2–1.2)
Total Protein: 6.9 g/dL (ref 6.1–8.1)

## 2019-05-31 DIAGNOSIS — Z23 Encounter for immunization: Secondary | ICD-10-CM | POA: Diagnosis not present

## 2019-07-01 ENCOUNTER — Other Ambulatory Visit: Payer: Self-pay | Admitting: Family Medicine

## 2019-07-01 NOTE — Telephone Encounter (Signed)
Ok to refill??  Last office visit 05/17/2019.  Last refill 03/01/2019.

## 2019-08-11 ENCOUNTER — Other Ambulatory Visit: Payer: Self-pay | Admitting: Family Medicine

## 2019-08-11 DIAGNOSIS — I10 Essential (primary) hypertension: Secondary | ICD-10-CM

## 2019-08-31 ENCOUNTER — Telehealth: Payer: Self-pay | Admitting: Internal Medicine

## 2019-08-31 ENCOUNTER — Telehealth: Payer: Self-pay | Admitting: Family Medicine

## 2019-08-31 NOTE — Telephone Encounter (Signed)
Would like to change pharmacy to [address] [place], [place] [address].

## 2019-08-31 NOTE — Telephone Encounter (Signed)
Pharmacy updated.

## 2019-08-31 NOTE — Telephone Encounter (Signed)
Patient calling to update his pharmacy. He states he does not want his prescriptions sent to [place] anymore. He would like them sent to [place], [place].

## 2019-09-02 NOTE — Telephone Encounter (Signed)
Pharmacy changed to [facility]

## 2020-01-11 ENCOUNTER — Other Ambulatory Visit: Payer: Self-pay | Admitting: Internal Medicine

## 2020-01-12 ENCOUNTER — Other Ambulatory Visit: Payer: Self-pay

## 2020-01-12 DIAGNOSIS — I10 Essential (primary) hypertension: Secondary | ICD-10-CM

## 2020-01-12 MED ORDER — LOSARTAN POTASSIUM 50 MG PO TABS: 50.0000 mg | ORAL_TABLET | Freq: Every day | ORAL | 2 refills | Status: DC

## 2020-01-13 ENCOUNTER — Other Ambulatory Visit: Payer: Self-pay | Admitting: Internal Medicine

## 2020-01-13 MED ORDER — TRAMADOL HCL 50 MG PO TABS
50.0000 mg | ORAL_TABLET | Freq: Four times a day (QID) | ORAL | 0 refills | Status: DC | PRN
Start: 2020-01-13 — End: 2020-07-20

## 2020-01-28 ENCOUNTER — Other Ambulatory Visit: Payer: Self-pay | Admitting: Internal Medicine

## 2020-01-31 ENCOUNTER — Encounter: Payer: Self-pay | Admitting: Family Medicine

## 2020-01-31 DIAGNOSIS — C44321 Squamous cell carcinoma of skin of nose: Secondary | ICD-10-CM | POA: Diagnosis not present

## 2020-01-31 DIAGNOSIS — L570 Actinic keratosis: Secondary | ICD-10-CM | POA: Diagnosis not present

## 2020-01-31 DIAGNOSIS — D1801 Hemangioma of skin and subcutaneous tissue: Secondary | ICD-10-CM | POA: Diagnosis not present

## 2020-01-31 DIAGNOSIS — D225 Melanocytic nevi of trunk: Secondary | ICD-10-CM | POA: Diagnosis not present

## 2020-01-31 DIAGNOSIS — L820 Inflamed seborrheic keratosis: Secondary | ICD-10-CM | POA: Diagnosis not present

## 2020-02-21 DIAGNOSIS — Z85828 Personal history of other malignant neoplasm of skin: Secondary | ICD-10-CM | POA: Diagnosis not present

## 2020-02-21 DIAGNOSIS — C44321 Squamous cell carcinoma of skin of nose: Secondary | ICD-10-CM | POA: Diagnosis not present

## 2020-02-29 DIAGNOSIS — Z23 Encounter for immunization: Secondary | ICD-10-CM | POA: Diagnosis not present

## 2020-04-13 ENCOUNTER — Telehealth: Payer: Self-pay | Admitting: Internal Medicine

## 2020-04-13 MED ORDER — RIVAROXABAN 20 MG PO TABS: ORAL_TABLET | ORAL | 0 refills | Status: DC

## 2020-04-13 NOTE — Telephone Encounter (Signed)
Pt last saw Dr Ladona Ridgel 11/27/18, pt is overdue for follow-up.  Pt has an appt scheduled to see Dr Ladona Ridgel on 06/19/20. Last labs 05/17/19 Creat 0.94, age 75, weight 96.2kg, CrCl 90.97, based on CrCl pt is on appropriate dosage of Xarelto 20mg  QD will refill rx to get pt to upcoming appt with Dr on 06/19/20.  Placed note on appt, pt will need CBC and BMP at [facility] as well.

## 2020-04-13 NOTE — Telephone Encounter (Signed)
°*  STAT* If patient is at the pharmacy, call can be transferred to refill team.   1. Which medications need to be refilled? (please list name of each medication and dose if known) xarelto 20 mg  2. Which pharmacy/location (including street and city if local pharmacy) is medication to be sent to? [facility]  3. Do they need a 30 day or 90 day supply? 90 days

## 2020-04-19 ENCOUNTER — Other Ambulatory Visit: Payer: Self-pay

## 2020-04-19 MED ORDER — FLECAINIDE ACETATE 150 MG PO TABS
150.0000 mg | ORAL_TABLET | Freq: Two times a day (BID) | ORAL | 1 refills | Status: DC
Start: 2020-04-19 — End: 2020-06-26

## 2020-04-19 NOTE — Telephone Encounter (Signed)
[name] medication was sent to pt's pharmacy as requested. Confirmation received.  °

## 2020-06-16 ENCOUNTER — Encounter: Payer: Self-pay | Admitting: Family Medicine

## 2020-06-16 DIAGNOSIS — H40013 Open angle with borderline findings, low risk, bilateral: Secondary | ICD-10-CM | POA: Diagnosis not present

## 2020-06-16 DIAGNOSIS — H04123 Dry eye syndrome of bilateral lacrimal glands: Secondary | ICD-10-CM | POA: Diagnosis not present

## 2020-06-16 DIAGNOSIS — Z961 Presence of intraocular lens: Secondary | ICD-10-CM | POA: Diagnosis not present

## 2020-06-19 ENCOUNTER — Other Ambulatory Visit: Payer: Self-pay

## 2020-06-19 ENCOUNTER — Encounter: Payer: Self-pay | Admitting: Internal Medicine

## 2020-06-19 ENCOUNTER — Ambulatory Visit (INDEPENDENT_AMBULATORY_CARE_PROVIDER_SITE_OTHER): Payer: Medicare Other | Admitting: Internal Medicine

## 2020-06-19 VITALS — BP 136/82 | HR 55 | Ht 70.0 in | Wt 211.0 lb

## 2020-06-19 DIAGNOSIS — I10 Essential (primary) hypertension: Secondary | ICD-10-CM | POA: Diagnosis not present

## 2020-06-19 DIAGNOSIS — I480 Paroxysmal atrial fibrillation: Secondary | ICD-10-CM

## 2020-06-19 DIAGNOSIS — I495 Sick sinus syndrome: Secondary | ICD-10-CM | POA: Diagnosis not present

## 2020-06-19 NOTE — Progress Notes (Signed)
HPI [name] [name] returns today for followup of his PAF and sinus node dysfunction. 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 He has had 2 episodes of atrial fib in the last year with the longest about a day. He has no limitations to activity. He remains active in the furniture business.  No Known Allergies   Current Outpatient Medications  Medication Sig Dispense Refill  . flecainide (TAMBOCOR) 150 MG tablet Take 1 tablet (150 mg total) by mouth 2 (two) times daily. Please keep upcoming appt in March 2022 before anymore refills. Thank you Final Attempt 60 tablet 1  . losartan (COZAAR) 50 MG tablet Take 1 tablet (50 mg total) by mouth daily. 90 tablet 2  . Multiple Vitamins-Minerals (CENTRUM SILVER ADULT 50+ PO) Take by mouth.    . rivaroxaban (XARELTO) 20 MG TABS tablet TAKE 1 TABLET BY MOUTH EVERY DAY WITH SUPPER.  Pt is Overdue for follow-up, MUST see MD for FUTURE refills. 90 tablet 0  . rosuvastatin (CRESTOR) 40 MG tablet TAKE 1 TABLET BY MOUTH ONCE DAILY. OFFICE VISIT REQUIRED BEFORE ANY FURTHER REFILLS CAN BE GIVEN 90 tablet 0  . traMADol (ULTRAM) 50 MG tablet Take 1 tablet (50 mg total) by mouth every 6 (six) hours as needed. for pain 120 tablet 0   No current facility-administered medications for this visit.     Past Medical History:  Diagnosis Date  . CVA (cerebral infarction) 07-14-09  . Hyperlipidemia   . Hypertension   . Nephrolithiasis   . Paroxysmal atrial fibrillation (HCC)   . Paroxysmal atrial flutter ([place])    a. s/p ablation 2012.  . Prostate cancer ([place])     ROS:   All systems reviewed and negative except as noted in the HPI.   Past Surgical History:  Procedure Laterality Date  . APPENDECTOMY    . BACK SURGERY    . PROSTATECTOMY  2005     Family History  Problem Relation Age of Onset  . Heart disease Mother 13       died  . Prostate cancer Father        alive  . Testicular cancer  Brother        died     Social History   Socioeconomic History  . Marital status: Married    Spouse name: Not on file  . Number of children: 2  . Years of education: Not on file  . Highest education level: Not on file  Occupational History  . Occupation: Surveyor, quantity: [employer]  Tobacco Use  . Smoking status: Never Smoker  . Smokeless tobacco: Never Used  Vaping Use  . Vaping Use: Never used  Substance and Sexual Activity  . Alcohol use: No  . Drug use: No  . Sexual activity: Not on file  Other Topics Concern  . Not on file  Social History Narrative  . Not on file   Social Determinants of Health   Financial Resource Strain: Not on file  Food Insecurity: Not on file  Transportation Needs: Not on file  Physical Activity: Not on file  Stress: Not on file  Social Connections: Not on file  Intimate Partner Violence: Not on file     BP 136/82   Pulse (!) 55   Ht 5\' 10"  (1.778 m)   Wt 211 lb (95.7 kg)   SpO2 97%  BMI 30.28 kg/m   Physical Exam:  Well appearing NAD HEENT: Unremarkable Neck:  No JVD, no thyromegally Lymphatics:  No adenopathy Back:  No CVA tenderness Lungs:  Clear with no wheezes HEART:  Regular rate rhythm, no murmurs, no rubs, no clicks Abd:  soft, positive bowel sounds, no organomegally, no rebound, no guarding Ext:  2 plus pulses, no edema, no cyanosis, no clubbing Skin:  No rashes no nodules Neuro:  CN II through XII intact, motor grossly intact  EKG - nsr with first degree AV block    Assess/Plan: 1. PAF - he will continue flecainide. His atrial fib is controlled with one episode in the past year. 2. Coags - he will continue xarelto. He has had no bleeding. 3. Sinus node dysfunction - he is asymptomatic. No indication at this point that a PPM is indicated. 4. HTN - his bp is controlled.   Carleene Overlie Taylor,MD

## 2020-06-19 NOTE — Patient Instructions (Signed)

## 2020-06-24 ENCOUNTER — Other Ambulatory Visit: Payer: Self-pay | Admitting: Internal Medicine

## 2020-06-28 ENCOUNTER — Encounter: Payer: Self-pay | Admitting: Family Medicine

## 2020-06-28 ENCOUNTER — Ambulatory Visit (INDEPENDENT_AMBULATORY_CARE_PROVIDER_SITE_OTHER): Payer: Medicare Other | Admitting: Family Medicine

## 2020-06-28 ENCOUNTER — Other Ambulatory Visit: Payer: Self-pay

## 2020-06-28 VITALS — BP 124/80 | HR 58 | Temp 97.9°F | Resp 14 | Ht 70.0 in | Wt 212.0 lb

## 2020-06-28 DIAGNOSIS — Z8673 Personal history of transient ischemic attack (TIA), and cerebral infarction without residual deficits: Secondary | ICD-10-CM

## 2020-06-28 DIAGNOSIS — Z125 Encounter for screening for malignant neoplasm of prostate: Secondary | ICD-10-CM

## 2020-06-28 DIAGNOSIS — I4891 Unspecified atrial fibrillation: Secondary | ICD-10-CM | POA: Diagnosis not present

## 2020-06-28 DIAGNOSIS — I10 Essential (primary) hypertension: Secondary | ICD-10-CM | POA: Diagnosis not present

## 2020-06-28 NOTE — Progress Notes (Signed)
Subjective:    Patient ID: [name] [name], male    DOB: 07-08-1945, 75 y.o.   MRN: [id number]  HPI  Patient is a very pleasant 75 year old Caucasian gentleman here today for a checkup.  His review of systems is negative.  He denies any chest pain or shortness of breath or dyspnea on exertion.  He does have a history of atrial fibrillation and he states perhaps once or twice a year, he will feel his heart go out of rhythm but it spontaneously goes back into sinus rhythm usually within a day.  Otherwise his heart rate is well controlled and he denies any palpitations.  He denies any syncope or near syncope.  He denies any abdominal pain, melena, hematochezia.  He is long overdue for colonoscopy however he is decided against this.  He also decided against Cologuard due to the high false positive rate in his opinion.  He elects not to screen for colon cancer.  He is due for prostate cancer screening with PSA.  His blood pressure today is outstanding.  Past Medical History:  Diagnosis Date  . CVA (cerebral infarction) 07-14-09  . Hyperlipidemia   . Hypertension   . Nephrolithiasis   . Paroxysmal atrial fibrillation (HCC)   . Paroxysmal atrial flutter ([place])    a. s/p ablation 2012.  . Prostate cancer [facility])    Past Surgical History:  Procedure Laterality Date  . APPENDECTOMY    . BACK SURGERY    . PROSTATECTOMY  2005   Current Outpatient Medications on File Prior to Visit  Medication Sig Dispense Refill  . flecainide (TAMBOCOR) 150 MG tablet TAKE 1 TABLET BY MOUTH TWICE DAILY. MUST KEEP APPT IN MARCH 2022 BEFORE REFILLS 60 tablet 11  . losartan (COZAAR) 50 MG tablet Take 1 tablet (50 mg total) by mouth daily. 90 tablet 2  . Multiple Vitamins-Minerals (CENTRUM SILVER ADULT 50+ PO) Take by mouth.    . rivaroxaban (XARELTO) 20 MG TABS tablet TAKE 1 TABLET BY MOUTH EVERY DAY WITH SUPPER.  Pt is Overdue for follow-up, MUST see MD for FUTURE refills. 90 tablet 0  . rosuvastatin (CRESTOR) 40 MG  tablet TAKE 1 TABLET BY MOUTH ONCE DAILY. OFFICE VISIT REQUIRED BEFORE ANY FURTHER REFILLS CAN BE GIVEN (Patient taking differently: Take 40 mg by mouth as needed. Every six months) 90 tablet 0  . traMADol (ULTRAM) 50 MG tablet Take 1 tablet (50 mg total) by mouth every 6 (six) hours as needed. for pain 120 tablet 0   No current facility-administered medications on file prior to visit.   No Known Allergies Social History   Socioeconomic History  . Marital status: Married    Spouse name: Not on file  . Number of children: 2  . Years of education: Not on file  . Highest education level: Not on file  Occupational History  . Occupation: Surveyor, quantity: [employer]  Tobacco Use  . Smoking status: Never Smoker  . Smokeless tobacco: Never Used  Vaping Use  . Vaping Use: Never used  Substance and Sexual Activity  . Alcohol use: No  . Drug use: No  . Sexual activity: Not on file  Other Topics Concern  . Not on file  Social History Narrative  . Not on file   Social Determinants of Health   Financial Resource Strain: Not on file  Food Insecurity: Not on file  Transportation Needs: Not on file  Physical Activity: Not on file  Stress:  Not on file  Social Connections: Not on file  Intimate Partner Violence: Not on file    Review of Systems     Objective:   Physical Exam Vitals reviewed.  Constitutional:      General: He is not in acute distress.    Appearance: Normal appearance. He is not ill-appearing or toxic-appearing.  Eyes:     General: Lids are normal. No visual field deficit.       Right eye: No discharge.        Left eye: No discharge.     Extraocular Movements: Extraocular movements intact.     Right eye: Normal extraocular motion and no nystagmus.     Left eye: Normal extraocular motion and no nystagmus.     Conjunctiva/sclera: Conjunctivae normal.     Right eye: Right conjunctiva is not injected. No chemosis or exudate.    Left eye: Left  conjunctiva is not injected. No chemosis or exudate.    Pupils: Pupils are equal, round, and reactive to light.  Cardiovascular:     Rate and Rhythm: Normal rate and regular rhythm.     Heart sounds: No murmur heard. No gallop.   Pulmonary:     Effort: Pulmonary effort is normal. No respiratory distress.     Breath sounds: Normal breath sounds. No wheezing or rales.  Neurological:     General: No focal deficit present.     Mental Status: He is alert and oriented to person, place, and time.     Cranial Nerves: No cranial nerve deficit.     Sensory: No sensory deficit.     Motor: No weakness.     Coordination: Coordination normal.     Gait: Gait normal.     Patient has a little bit of a shuffling gait while walking but there is no resting tremor seen on his exam, no bradykinesia, no decreased blink reflex.  This is something that we will clinically monitor.      Assessment & Plan:  Atrial fibrillation, unspecified type ([place]) - Plan: CBC with Differential/Platelet, COMPLETE METABOLIC PANEL WITH GFR, Lipid panel  Benign essential HTN - Plan: CBC with Differential/Platelet, COMPLETE METABOLIC PANEL WITH GFR, Lipid panel  History of TIA (transient ischemic attack) - Plan: CBC with Differential/Platelet, COMPLETE METABOLIC PANEL WITH GFR, Lipid panel  Prostate cancer screening - Plan: PSA  Patient is in normal sinus rhythm today.  His blood pressure is outstanding.  His review of systems is negative.  I will check a CBC to rule out anemia as well as any bone marrow abnormalities.  Check a CMP to monitor his renal and liver function test.  Check a fasting lipid panel.  Ideally given his history of TIA, I would like to see his LDL cholesterol below 70.  His blood pressure today is outstanding.  He is only taking half, a 50 mg losartan, a day.  This is still managing to control his blood pressure at home.  He states that he checks it at home is typically in the 130/80 range.  I will screen for  prostate cancer with a PSA.  He refuses colon cancer screening.

## 2020-06-29 ENCOUNTER — Ambulatory Visit: Payer: Medicare Other | Admitting: Family Medicine

## 2020-06-29 LAB — CBC WITH DIFFERENTIAL/PLATELET
Absolute Monocytes: 524 cells/uL (ref 200–950)
Basophils Absolute: 60 cells/uL (ref 0–200)
Basophils Relative: 1.3 %
Eosinophils Absolute: 152 cells/uL (ref 15–500)
Eosinophils Relative: 3.3 %
HCT: 49.5 % (ref 38.5–50.0)
Hemoglobin: 16.7 g/dL (ref 13.2–17.1)
Lymphs Abs: 1205 cells/uL (ref 850–3900)
MCH: 30.9 pg (ref 27.0–33.0)
MCHC: 33.7 g/dL (ref 32.0–36.0)
MCV: 91.5 fL (ref 80.0–100.0)
MPV: 10 fL (ref 7.5–12.5)
Monocytes Relative: 11.4 %
Neutro Abs: 2659 cells/uL (ref 1500–7800)
Neutrophils Relative %: 57.8 %
Platelets: 227 10*3/uL (ref 140–400)
RBC: 5.41 10*6/uL (ref 4.20–5.80)
RDW: 12.1 % (ref 11.0–15.0)
Total Lymphocyte: 26.2 %
WBC: 4.6 10*3/uL (ref 3.8–10.8)

## 2020-06-29 LAB — COMPLETE METABOLIC PANEL WITH GFR
AG Ratio: 1.6 (calc) (ref 1.0–2.5)
ALT: 17 U/L (ref 9–46)
AST: 17 U/L (ref 10–35)
Albumin: 4.4 g/dL (ref 3.6–5.1)
Alkaline phosphatase (APISO): 60 U/L (ref 35–144)
BUN: 16 mg/dL (ref 7–25)
CO2: 27 mmol/L (ref 20–32)
Calcium: 9.5 mg/dL (ref 8.6–10.3)
Chloride: 104 mmol/L (ref 98–110)
Creat: 0.99 mg/dL (ref 0.70–1.18)
GFR, Est African American: 85 mL/min/{1.73_m2} (ref >=60)
GFR, Est Non African American: 73 mL/min/{1.73_m2} (ref >=60)
Globulin: 2.7 g/dL (calc) (ref 1.9–3.7)
Glucose, Bld: 94 mg/dL (ref 65–99)
Potassium: 4.4 mmol/L (ref 3.5–5.3)
Sodium: 139 mmol/L (ref 135–146)
Total Bilirubin: 0.6 mg/dL (ref 0.2–1.2)
Total Protein: 7.1 g/dL (ref 6.1–8.1)

## 2020-06-29 LAB — LIPID PANEL
Cholesterol: 213 mg/dL — ABNORMAL HIGH (ref <200)
HDL: 35 mg/dL — ABNORMAL LOW (ref >=40)
LDL Cholesterol (Calc): 156 mg/dL (calc) — ABNORMAL HIGH
Non-HDL Cholesterol (Calc): 178 mg/dL (calc) — ABNORMAL HIGH (ref <130)
Total CHOL/HDL Ratio: 6.1 (calc) — ABNORMAL HIGH (ref <5.0)
Triglycerides: 103 mg/dL (ref <150)

## 2020-06-29 LAB — PSA: PSA: 0.04 ng/mL (ref <=4.0)

## 2020-06-30 ENCOUNTER — Other Ambulatory Visit: Payer: Self-pay | Admitting: *Deleted

## 2020-06-30 MED ORDER — ATORVASTATIN CALCIUM 20 MG PO TABS: 20.0000 mg | ORAL_TABLET | Freq: Every day | ORAL | 3 refills | Status: DC

## 2020-07-20 ENCOUNTER — Other Ambulatory Visit: Payer: Self-pay | Admitting: Family Medicine

## 2020-07-31 ENCOUNTER — Telehealth: Payer: Self-pay | Admitting: Family Medicine

## 2020-07-31 NOTE — Telephone Encounter (Signed)
Copied from [place] [id number]. Topic: Medicare AWV >> Jul 31, 2020  2:12 PM Cher Nakai R wrote: Reason for CRM: Left message for patient to call back and schedule Medicare Annual Wellness Visit (AWV) in office.   If not able to come in office, please offer to do virtually or by telephone.   Last AWV:  06/01/2018  Please schedule at anytime with [facility] Health Advisor.  If any questions, please contact me at [phone]

## 2020-08-01 DIAGNOSIS — Z8546 Personal history of malignant neoplasm of prostate: Secondary | ICD-10-CM | POA: Diagnosis not present

## 2020-08-01 DIAGNOSIS — K8080 Other cholelithiasis without obstruction: Secondary | ICD-10-CM | POA: Diagnosis not present

## 2020-08-01 DIAGNOSIS — Z87442 Personal history of urinary calculi: Secondary | ICD-10-CM | POA: Diagnosis not present

## 2020-08-01 DIAGNOSIS — N200 Calculus of kidney: Secondary | ICD-10-CM | POA: Diagnosis not present

## 2020-08-01 DIAGNOSIS — R310 Gross hematuria: Secondary | ICD-10-CM | POA: Diagnosis not present

## 2020-08-01 DIAGNOSIS — N393 Stress incontinence (female) (male): Secondary | ICD-10-CM | POA: Diagnosis not present

## 2020-08-22 ENCOUNTER — Other Ambulatory Visit: Payer: Self-pay | Admitting: *Deleted

## 2020-08-22 MED ORDER — TRAMADOL HCL 50 MG PO TABS: ORAL_TABLET | ORAL | 0 refills | Status: DC

## 2020-08-22 NOTE — Telephone Encounter (Signed)
Received call from patient.   Requested refill on Tramadol.   Ok to refill??  Last office visit 06/28/2020.  Last refill 07/20/2020.

## 2020-08-29 DIAGNOSIS — N200 Calculus of kidney: Secondary | ICD-10-CM | POA: Diagnosis not present

## 2020-08-29 DIAGNOSIS — R310 Gross hematuria: Secondary | ICD-10-CM | POA: Diagnosis not present

## 2020-09-01 ENCOUNTER — Other Ambulatory Visit: Payer: Self-pay | Admitting: Urology

## 2020-09-01 ENCOUNTER — Telehealth: Payer: Self-pay | Admitting: Internal Medicine

## 2020-09-01 NOTE — Telephone Encounter (Signed)
   [facility] Pre-operative Risk Assessment    Patient Name: RASHI GIULIANI  DOB: 1945-09-18  MRN: [id number]   HEARTCARE STAFF: - Please ensure there is not already an duplicate clearance open for this procedure. - Under Visit Info/Reason for Call, type in Other and utilize the format Clearance MM/DD/YY or Clearance TBD. Do not use dashes or single digits. - If request is for dental extraction, please clarify the # of teeth to be extracted.  Request for surgical clearance:  1. What type of surgery is being performed? Ureteroscopy  2. When is this surgery scheduled? 09/19/20  3. What type of clearance is required (medical clearance vs. Pharmacy clearance to hold med vs. Both)? Both  4. Are there any medications that need to be held prior to surgery and how long? Xarelto for 72 hrs.  5. Practice name and name of physician performing surgery? [facility]; Dr. Irine Seal  6. What is the office phone number?  [phone]   7.   What is the office fax number? [phone]  8.   Anesthesia type (None, local, MAC, general) ? General   Phillip Pough 09/01/2020, 3:53 PM  _________________________________________________________________   (provider comments below)

## 2020-09-01 NOTE — Telephone Encounter (Signed)
   Patient Name: [name] [name]  DOB: May 02, 1945  MRN: [id number]   Primary Cardiologist: None  Chart reviewed as part of pre-operative protocol coverage.   75 year old male with history of Afib, atrial flutter s/p ablation, CVA in 2011, sinus node dysfunction, HTN, and HLD who is scheduled to undergo a ureteroscopy on 09/19/2020. Urology has requested Xarelto be held 3 days prior. Per office protocol, I will route to the pharmacy team for recommendations on anticoagulation.    Christell Faith, PA-C 09/01/2020, 4:00 PM

## 2020-09-01 NOTE — Telephone Encounter (Signed)
Patient with diagnosis of afib on Xarelto for anticoagulation.    Procedure: uteroscopy Date of procedure: 09/19/20  CHA2DS2-VASc Score = 5  This indicates a 7.2% annual risk of stroke. The patient's score is based upon: CHF History: No HTN History: Yes Diabetes History: No Stroke History: Yes Vascular Disease History: No Age Score: 2 Gender Score: 0     CrCl 72 ml/min Platelet count 227  Uterscopy is a higher bleed risk procedure and a 3 day hold is a reasonable request. However, patient does have a history of a stroke, therefore I will defer to Dr. Lovena Le.

## 2020-09-01 NOTE — Telephone Encounter (Signed)
   Patient Name: [name] [name]  DOB: [birth year]  MRN: [id number]   Primary Cardiologist: None  Chart reviewed as part of pre-operative protocol coverage.   75 year old male with history of Afib, atrial flutter s/p ablation, CVA in 2011, sinus node dysfunction, HTN, and HLD who is scheduled to undergo a ureteroscopy on 09/19/2020. Urology has requested Xarelto be held 3 days prior.   Per pharmacy: Procedure: uteroscopy Date of procedure: 09/19/20  CHA2DS2-VASc Score = 5  This indicates a 7.2% annual risk of stroke. The patient's score is based upon: CHF History: No HTN History: Yes Diabetes History: No Stroke History: Yes Vascular Disease History: No Age Score: 2 Gender Score: 0     CrCl 72 ml/min Platelet count 227  Uterscopy is a higher bleed risk procedure and a 3 day hold is a reasonable request. However, patient does have a history of a stroke, therefore I will defer to Dr. Lovena Le.  Dr. Lovena Le, please see the above request to hold Xarelto for 3 days in this patient with a history of CVA. Are you ok with this hold? Please route reply back to the preop pool (p cv div preop).    Christell Faith, PA-C 09/01/2020, 4:22 PM

## 2020-09-06 NOTE — Patient Instructions (Addendum)
DUE TO COVID-19 ONLY ONE VISITOR IS ALLOWED TO COME WITH YOU AND STAY IN THE WAITING ROOM ONLY DURING PRE OP AND PROCEDURE.     Your procedure is scheduled on: 09/19/20   Report to [facility] Main  Entrance    Report to admitting at 7 AM   Call this number if you have problems the morning of surgery [phone]   Do not eat food :After Midnight.   May have clear liquids until  6 AM  day of surgery then nothing by mouth  CLEAR LIQUID DIET  Foods Allowed                                                                     Foods Excluded  Water, Black Coffee and tea, regular and decaf             liquids that you cannot  Plain Jell-O in any flavor  (No red)                                    see through such as: Fruit ices (not with fruit pulp)                                            milk, soups, orange juice              Iced Popsicles (No red)                                               All solid food                                   Apple juices Sports drinks like Gatorade (No red) Lightly seasoned clear broth or consume(fat free) Sugar, honey syrup      Oral Hygiene is also important to reduce your risk of infection.                                    Remember - BRUSH YOUR TEETH THE MORNING OF SURGERY WITH YOUR REGULAR TOOTHPASTE   Do NOT smoke after Midnight   Take these medicines the morning of surgery with A SIP OF WATER:  Flecainide.                               You may not have any metal on your body including hair pins, jewelry, and body piercing            Do not wear  lotions, powders, cologne, or deodorant             Men may shave face and neck.   Do not bring valuables to the hospital. [facility] IS NOT  RESPONSIBLE   FOR VALUABLES.   Contacts, dentures or bridgework may not be worn into surgery.    Patients discharged the day of surgery will not be allowed to drive home.   Special Instructions: Bring a copy of your healthcare  power of attorney and living will documents  the day of surgery if you haven't scanned them in before.              Please read over the following fact sheets you were given: IF YOU HAVE QUESTIONS ABOUT YOUR PRE OP INSTRUCTIONS PLEASE CALL [phone]   [facility] - Preparing for Surgery Before surgery, you can play an important role.  Because skin is not sterile, your skin needs to be as free of germs as possible.  You can reduce the number of germs on your skin by washing with CHG (chlorahexidine gluconate) soap before surgery.  CHG is an antiseptic cleaner which kills germs and bonds with the skin to continue killing germs even after washing. Please DO NOT use if you have an allergy to CHG or antibacterial soaps.  If your skin becomes reddened/irritated stop using the CHG and inform your nurse when you arrive at Short Stay. Do not shave (including legs and underarms) for at least 48 hours prior to the first CHG shower.  You may shave your face/neck.  Please follow these instructions carefully:  1.  Shower with CHG Soap the night before surgery and the  morning of surgery.  2.  If you choose to wash your hair, wash your hair first as usual with your normal  shampoo.  3.  After you shampoo, rinse your hair and body thoroughly to remove the shampoo.                             4.  Use CHG as you would any other liquid soap.  You can apply chg directly to the skin and wash.  Gently with a scrungie or clean washcloth.  5.  Apply the CHG Soap to your body ONLY FROM THE NECK DOWN.   Do   not use on face/ open                           Wound or open sores. Avoid contact with eyes, ears mouth and   genitals (private parts).                       Wash face,  Genitals (private parts) with your normal soap.             6.  Wash thoroughly, paying special attention to the area where your    surgery  will be performed.  7.  Thoroughly rinse your body with warm water from the neck down.  8.  DO NOT  shower/wash with your normal soap after using and rinsing off the CHG Soap.                9.  Pat yourself dry with a clean towel.            10.  Wear clean pajamas.            11.  Place clean sheets on your bed the night of your first shower and do not  sleep with pets. Day of Surgery : Do not apply any lotions/deodorants the morning of surgery.  Please wear  clean clothes to the hospital/surgery center.  FAILURE TO FOLLOW THESE INSTRUCTIONS MAY RESULT IN THE CANCELLATION OF YOUR SURGERY  PATIENT SIGNATURE_________________________________  NURSE SIGNATURE__________________________________  ________________________________________________________________________

## 2020-09-06 NOTE — Progress Notes (Signed)
COVID Vaccine Completed: Yes x3 Date COVID Vaccine completed: 05-01-19, 05-31-19,  Has received booster:02-29-20 COVID vaccine manufacturer:   Moderna     Date of COVID positive in last 90 days:  PCP - Jenna Luo, MD Cardiologist - Cristopher Peru, MD  Chest x-ray -  EKG - 06-19-20 Epic Stress Test - 2011 Epic ECHO - 2011 Epic Cardiac Cath -  Pacemaker/ICD device last checked: Spinal Cord Stimulator:  Sleep Study -  CPAP -   Fasting Blood Sugar -  Checks Blood Sugar _____ times a day  Blood Thinner Instructions:  Xarelto (urology has requested 3 day hold) Aspirin Instructions: Last Dose:  Activity level:  Can go up a flight of stairs and perform activities of daily living without stopping and without symptoms of chest pain or shortness of breath.   Able to exercise without symptoms  Unable to go up a flight of stairs without symptoms of      Anesthesia review:  Afib, fluuter, sinus node dysfunction, CVA, HTN  Patient denies shortness of breath, fever, cough and chest pain at PAT appointment   Patient verbalized understanding of instructions that were given to them at the PAT appointment. Patient was also instructed that they will need to review over the PAT instructions again at home before surgery.

## 2020-09-07 ENCOUNTER — Encounter (HOSPITAL_COMMUNITY)
Admission: RE | Admit: 2020-09-07 | Discharge: 2020-09-07 | Disposition: A | Discharge status: Home / Self Care | Payer: Medicare Other | Source: Ambulatory Visit | Attending: Urology | Admitting: Urology

## 2020-09-07 ENCOUNTER — Other Ambulatory Visit: Payer: Self-pay

## 2020-09-07 ENCOUNTER — Encounter (HOSPITAL_COMMUNITY): Payer: Self-pay

## 2020-09-07 DIAGNOSIS — Z01812 Encounter for preprocedural laboratory examination: Secondary | ICD-10-CM | POA: Diagnosis not present

## 2020-09-07 HISTORY — DX: Cardiac arrhythmia, unspecified: I49.9

## 2020-09-07 HISTORY — DX: Personal history of urinary calculi: Z87.442

## 2020-09-07 HISTORY — DX: Cerebral infarction, unspecified: I63.9

## 2020-09-07 LAB — CBC
HCT: 47.8 % (ref 39.0–52.0)
Hemoglobin: 15.8 g/dL (ref 13.0–17.0)
MCH: 30.7 pg (ref 26.0–34.0)
MCHC: 33.1 g/dL (ref 30.0–36.0)
MCV: 93 fL (ref 80.0–100.0)
Platelets: 207 10*3/uL (ref 150–400)
RBC: 5.14 MIL/uL (ref 4.22–5.81)
RDW: 12.6 % (ref 11.5–15.5)
WBC: 6.7 10*3/uL (ref 4.0–10.5)
nRBC: 0 % (ref 0.0–0.2)

## 2020-09-07 LAB — BASIC METABOLIC PANEL
Anion gap: 4 — ABNORMAL LOW (ref 5–15)
BUN: 18 mg/dL (ref 8–23)
CO2: 26 mmol/L (ref 22–32)
Calcium: 9.1 mg/dL (ref 8.9–10.3)
Chloride: 111 mmol/L (ref 98–111)
Creatinine, Ser: 0.93 mg/dL (ref 0.61–1.24)
GFR, Estimated: >60 mL/min (ref >60)
Glucose, Bld: 102 mg/dL — ABNORMAL HIGH (ref 70–99)
Potassium: 4.1 mmol/L (ref 3.5–5.1)
Sodium: 141 mmol/L (ref 135–145)

## 2020-09-07 NOTE — Progress Notes (Signed)
Patient verbalized understanding of instructions that were given to them at the PAT appointment. Patient was also instructed that they will need to review over the PAT instructions again at home before surgery.

## 2020-09-18 NOTE — H&P (Signed)
08/29/2020: CT imaging revealed a 4 mm nonobstructing renal pelvis stone. Hematuria improved with holding anticoagulation therapy. Now back today for follow-up exam with KUB and renal ultrasound. His urinalysis is clear today. He continues to have intermittent hematuria described as rust colored urine usually noted as the day progresses. It clears if he hold his anticoagulation therapy. Not associated with left-sided pain or discomfort suggestive of obstructive uropathy. Not associated with burning or painful urination, increased frequency/urgency or incontinence from his previously noted baseline. He remains afebrile without nausea or vomiting. He denies any interval stone material passage.   08/01/20: Jada returns today with the complaint of gross hematuria. He has had some red urine intermittently over the last 1-2 weeks. He had an episode about a year ago and thought he might have had a stone but had no pain. He has no flank pain. He is voiding with nocturia x 2-3. He has SUI with 2-3ppd needed. He has no dysuria. His urologic history is as below. He had a PSA done in March that remains low at 0.04. It was previously <0.01. His recent Cr was normal. His urine is bloody today. He remains on Xarelto and only held it one night 2 days ago, but took it last night.    1 - Recurrent Nephrolithiasis -  Pre 2018 medical passage x several. Prior composition 80% Urate / 20% CaOx.  12/2016 punctate Rt intrarenal stone by CT on eval LLQ pain   2 - Prostate Cancer - s/p prostatectomy 2010 0by Prather Failla  2018 PSA <0.1 by PCP labs     ALLERGIES: No Allergies    MEDICATIONS: Flecainide Acetate 150 mg tablet Oral  Losartan Potassium 50 mg tablet  Rosuvastatin Calcium 40 mg tablet  Tramadol Hcl 50 mg tablet Oral  Tumeric  Vitamin B12  Vitamin C  Xarelto 20 mg tablet     GU PSH: Locm 300-399Mg /Ml Iodine,1Ml - 08/01/2020 Radical Prostatectomy - about 2005       PSH Notes: Surgery Of The Eyelids, Back  Surgery, Prostatect Retropubic Radical W/ Bilat Pelv Lymphadenectomy, Kidney Surgery, Appendectomy   NON-GU PSH: Appendectomy - 2008 Revise Eyelid - 2010     GU PMH: Gross hematuria, He has painless gross hematuria with a history of stones. The CT today shows a 5mm left renal stone that is non-obstructing. This could be the source of the bleeding, but I think he needs cystoscopy, It might be worthwhile to do ureteroscopy to remove the stone which would provide the cystoscopy as well. I will discuss it with him further once the culture is back.I will have him hold the Xarelto. I have notified his cardiologist. - 08/01/2020, Gross hematuria, - 2015, Gross Hematuria, - 2014 History of prostate cancer, His PSA remains low at 0.04 on 06/28/20 - 08/01/2020, Prostate Cancer, - 2014 History of urolithiasis, HE has a 80mm left renal stone without obstruction in a calyceal infundibulum. - 08/01/2020 Stress Incontinence, He has stable moderate SUI but no irritative symtoms. - 08/01/2020, Male stress incontinence, - 2014 LLQ pain (Acute), Left - 2018 Prostate Cancer (Chronic) - 2018 Renal and ureteral calculus (Stable, Chronic) - 2018 Abdominal Pain Unspec, Left flank pain - 2015 Adrenal mass Unspec, Adrenal cortical adenoma, unspecified laterality - 2014 ED due to arterial insufficiency, Erectile dysfunction due to arterial insufficiency - 2014 Ureteral calculus, Distal Ureteral Stone On The Left - 2014, Calculus of ureter, - 2014 Urinary Urgency, Feelings Of Urinary Urgency - 2014      PMH Notes:  2008-06-13  08:44:54 - Note: Flank Pain Left   NON-GU PMH: Encounter for general adult medical examination without abnormal findings, Encounter for preventive health examination - 2015 Personal history of other diseases of the musculoskeletal system and connective tissue, History of low back pain - 2014 Personal history of other endocrine, nutritional and metabolic disease, History of hypercholesterolemia -  2014 Personal history of transient ischemic attack (TIA), and cerebral infarction without residual deficits, History of transient cerebral ischemia - 2014 Tachycardia, unspecified, Tachycardia - 2014 Unspecified atrial fibrillation, Atrial Fibrillation - 2014 Hypercholesterolemia Hypertension Stroke/TIA    FAMILY HISTORY: Acute Myocardial Infarction - Mother Cancer - Daughter, Sister, Brother Coronary Artery Disease - Mother Death In The Family Mother - Mother Family Health Status Number - Runs In Family Prostate Cancer - Father   SOCIAL HISTORY: Marital Status: Married Preferred Language: English Current Smoking Status: Patient has never smoked.   Tobacco Use Assessment Completed: Used Tobacco in last 30 days? Has never drank.  Does not drink caffeine. Patient's occupation is/was Retired.     Notes: Never A Smoker, Occupation:, Alcohol Use, Tobacco Use, Marital History - Currently Married   REVIEW OF SYSTEMS:    GU Review Male:   Patient reports get up at night to urinate and leakage of urine. Patient denies frequent urination, hard to postpone urination, burning/ pain with urination, stream starts and stops, trouble starting your stream, have to strain to urinate , erection problems, and penile pain.  Gastrointestinal (Upper):   Patient denies nausea, vomiting, and indigestion/ heartburn.  Gastrointestinal (Lower):   Patient denies diarrhea and constipation.  Constitutional:   Patient denies fever, night sweats, weight loss, and fatigue.  Skin:   Patient denies skin rash/ lesion and itching.  Eyes:   Patient denies blurred vision and double vision.  Ears/ Nose/ Throat:   Patient denies sore throat and sinus problems.  Hematologic/Lymphatic:   Patient denies swollen glands and easy bruising.  Cardiovascular:   Patient denies chest pains and leg swelling.  Respiratory:   Patient denies cough and shortness of breath.  Endocrine:   Patient denies excessive thirst.   Musculoskeletal:   lower back on right. intermittent. Patient reports back pain. Patient denies joint pain.  Neurological:   Patient denies headaches and dizziness.  Psychologic:   Patient denies depression and anxiety.   Notes: Reviewed previous review of systems 04/28/07. No Changes.   VITAL SIGNS:      08/29/2020 08:32 AM  Weight 205 lb / 92.99 kg  Height 70 in / 177.8 cm  BP 166/91 mmHg  Pulse 50 /min  Temperature 97.7 F / 36.5 C  BMI 29.4 kg/m   MULTI-SYSTEM PHYSICAL EXAMINATION:    Constitutional: Well-nourished. No physical deformities. Normally developed. Good grooming.   Neck: Neck symmetrical, not swollen. Normal tracheal position.  Respiratory: No labored breathing, no use of accessory muscles.   Cardiovascular: Normal temperature, normal extremity pulses, no swelling, no varicosities.  Skin: No paleness, no jaundice, no cyanosis. No lesion, no ulcer, no rash.  Neurologic / Psychiatric: Oriented to time, oriented to place, oriented to person. No depression, no anxiety, no agitation.  Gastrointestinal: Obese abdomen. No mass, no tenderness, no rigidity.   Musculoskeletal: Normal gait and station of head and neck.     Complexity of Data:  Source Of History:  Patient, Medical Record Summary  Records Review:   Previous Doctor Records, Previous Hospital Records, Previous Patient Records  Urine Test Review:   Urinalysis, Urine Culture  X-Ray Review: KUB: Reviewed Films.  Discussed With Patient.  Renal Ultrasound: Reviewed Films. Discussed With Patient.  C.T. Hematuria: Reviewed Films. Reviewed Report.     03/14/10 12/09/08 05/24/08 07/06/07 01/05/07 06/17/06 12/12/05 04/18/05  PSA  Total PSA <0.01  <0.04  0.04  0.00  0.01  0.01  0.01  0.00     08/29/20  Urinalysis  Urine Appearance Clear   Urine Color Yellow   Urine Glucose Neg mg/dL  Urine Bilirubin Neg mg/dL  Urine Ketones Neg mg/dL  Urine Specific Gravity 1.020   Urine Blood Neg ery/uL  Urine pH <=5.0   Urine  Protein Neg mg/dL  Urine Urobilinogen 0.2 mg/dL  Urine Nitrites Neg   Urine Leukocyte Esterase Neg leu/uL  Notes:                     CLINICAL DATA: Gross hematuria   EXAM:  CT ABDOMEN AND PELVIS WITHOUT AND WITH CONTRAST   TECHNIQUE:  Multidetector CT imaging of the abdomen and pelvis was performed  following the standard protocol before and following the bolus  administration of intravenous contrast.   CONTRAST: 125 mL Omnipaque 300 iodinated contrast IV   COMPARISON: 12/24/2016   FINDINGS:  Lower chest: No acute abnormality. Cardiomegaly.   Hepatobiliary: No solid liver abnormality is seen. Gallstones in the  dependent gallbladder. No gallbladder wall thickening, or biliary  dilatation.   Pancreas: Unremarkable. No pancreatic ductal dilatation or  surrounding inflammatory changes.   Spleen: Normal in size without significant abnormality.   Adrenals/Urinary Tract: Stable, definitively benign fatty  attenuation left adrenal adenoma. 4 mm nonobstructive calculus in  the left renal pelvis (series 601, image 83). No right-sided  calculi, ureteral calculi, or hydronephrosis. No evidence of urinary  tract filling defect on delayed phase imaging. Bladder is  unremarkable.   Stomach/Bowel: Stomach is within normal limits. Small incidental  diverticulum of the transverse portion of the duodenum. Appendix is  not clearly visualized and may be surgically absent. No evidence of  bowel wall thickening, distention, or inflammatory changes.  Occasional sigmoid diverticula. Unchanged mild fat stranding of the  central small bowel mesentery, consistent with benign sequelae of  prior infection or inflammation (series 2, image 36).   Vascular/Lymphatic: Aortic atherosclerosis. No enlarged abdominal or  pelvic lymph nodes.   Reproductive: Status post prostatectomy.   Other: No abdominal wall hernia or abnormality. No abdominopelvic  ascites.   Musculoskeletal: No acute or  significant osseous findings.   IMPRESSION:  1. There is a 4 mm nonobstructive calculus in the left renal pelvis.  No right-sided calculi, ureteral calculi, or hydronephrosis.  2. No evidence of suspicious mass, contrast enhancement, or urinary  tract filling defect on delayed phase imaging.  3. Status post prostatectomy. No evidence of malignant recurrence in  the abdomen or pelvis.  4. Cholelithiasis without evidence of cholecystitis.   Aortic Atherosclerosis (ICD10-I70.0).    Electronically Signed  By: Eddie Candle M.D.  On: 08/01/2020 16:07      PROCEDURES:         KUB - 74018  A single view of the abdomen is obtained. Previously identified left renal pelvis calculus is not well seen on today's KUB study. On inverse imaging, there may be a faint opacity in the middle or lower pole region. Tracing down the anatomical expected tract of the left ureter, an obvious opacity consistent with a ureteral calculus is not seen. Bladder grossly appears free of obstruction.      . Patient confirmed No Neulasta OnPro Device.  Renal Ultrasound - 20802  Right Kidney: Length: 10.4 cm Depth: 5.6 cm Cortical Width: 1.0 cm Width:6.0 cm  Left Kidney: Length:11.5 cm Depth: 4.6 cm Cortical Width1.1: cm Width:5.2 cm  Left Kidney/Ureter:  There is a mid pole non obstructing calc noted measuring 4.1 mm.  Right Kidney/Ureter:  Appears wnl  Bladder:  Decompressed and difficult to visualize      . Patient confirmed No Neulasta OnPro Device.           Urinalysis Dipstick Dipstick Cont'd  Color: Yellow Bilirubin: Neg mg/dL  Appearance: Clear Ketones: Neg mg/dL  Specific Gravity: 1.020 Blood: Neg ery/uL  pH: <=5.0 Protein: Neg mg/dL  Glucose: Neg mg/dL Urobilinogen: 0.2 mg/dL    Nitrites: Neg    Leukocyte Esterase: Neg leu/uL    ASSESSMENT:      ICD-10 Details  1 GU:   Renal calculus - N20.0 Left, Undiagnosed New Problem  2   Gross hematuria - M33.6 Acute, Uncomplicated    PLAN:           Schedule Return Visit/Planned Activity: Other See Visit Notes - Follow up MD             Note: Will discuss with Dr Jeffie Pollock before scheduling the patient for f/u.          Document Letter(s):  Created for Patient: Clinical Summary         Notes:   Previously identified left renal pelvis stone not well seen on today's KUB study. Is better visualized on renal ultrasound which demonstrates no obvious obstructive signs. Urinalysis is clear today. He continues to have intermittent hematuria almost on a daily occurrence, better if he holds his xarelto. Not associated with pain or discomfort, new or worsening lower urinary tract symptoms. The hematuria may be caused by some movement of the stone within the kidney especially with the patient being on anticoagulation therapy. Treatment options would include continued observation versus ureteroscopy as with the stone not being visualized, he is not a good candidate for shockwave lithotripsy. His urologist may also want to see him back for cystoscopy so I will discuss that with him before scheduling the patient follow-up.   **For ureteroscopy I described the risks which include heart attack, stroke, pulmonary embolus, death, bleeding, infection, damage to contiguous structures, positioning injury, ureteral stricture, ureteral avulsion, ureteral injury, need for ureteral stent, inability to perform ureteroscopy, need for an interval procedure, inability to clear stone burden, stent discomfort and pain.

## 2020-09-19 ENCOUNTER — Ambulatory Visit (HOSPITAL_COMMUNITY): Payer: Medicare Other

## 2020-09-19 ENCOUNTER — Other Ambulatory Visit: Payer: Self-pay

## 2020-09-19 ENCOUNTER — Ambulatory Visit (HOSPITAL_COMMUNITY): Payer: Medicare Other | Admitting: Certified Registered"

## 2020-09-19 ENCOUNTER — Ambulatory Visit (HOSPITAL_COMMUNITY)
Admission: RE | Admit: 2020-09-19 | Discharge: 2020-09-19 | Disposition: A | Discharge status: Home / Self Care | Payer: Medicare Other | Source: Ambulatory Visit | Attending: Urology | Admitting: Urology

## 2020-09-19 ENCOUNTER — Encounter (HOSPITAL_COMMUNITY): Payer: Self-pay | Admitting: Urology

## 2020-09-19 ENCOUNTER — Encounter (HOSPITAL_COMMUNITY)
Admission: RE | Disposition: A | Discharge status: Home / Self Care | Payer: Self-pay | Source: Ambulatory Visit | Attending: Urology

## 2020-09-19 DIAGNOSIS — N200 Calculus of kidney: Secondary | ICD-10-CM | POA: Diagnosis not present

## 2020-09-19 DIAGNOSIS — I4891 Unspecified atrial fibrillation: Secondary | ICD-10-CM | POA: Diagnosis not present

## 2020-09-19 DIAGNOSIS — Z8546 Personal history of malignant neoplasm of prostate: Secondary | ICD-10-CM | POA: Insufficient documentation

## 2020-09-19 DIAGNOSIS — E785 Hyperlipidemia, unspecified: Secondary | ICD-10-CM | POA: Diagnosis not present

## 2020-09-19 DIAGNOSIS — Z809 Family history of malignant neoplasm, unspecified: Secondary | ICD-10-CM | POA: Diagnosis not present

## 2020-09-19 DIAGNOSIS — Z8249 Family history of ischemic heart disease and other diseases of the circulatory system: Secondary | ICD-10-CM | POA: Diagnosis not present

## 2020-09-19 DIAGNOSIS — R310 Gross hematuria: Secondary | ICD-10-CM | POA: Diagnosis not present

## 2020-09-19 DIAGNOSIS — H409 Unspecified glaucoma: Secondary | ICD-10-CM | POA: Diagnosis not present

## 2020-09-19 DIAGNOSIS — Z8042 Family history of malignant neoplasm of prostate: Secondary | ICD-10-CM | POA: Diagnosis not present

## 2020-09-19 DIAGNOSIS — N329 Bladder disorder, unspecified: Secondary | ICD-10-CM | POA: Insufficient documentation

## 2020-09-19 DIAGNOSIS — Z79899 Other long term (current) drug therapy: Secondary | ICD-10-CM | POA: Diagnosis not present

## 2020-09-19 DIAGNOSIS — Z87442 Personal history of urinary calculi: Secondary | ICD-10-CM | POA: Insufficient documentation

## 2020-09-19 DIAGNOSIS — D303 Benign neoplasm of bladder: Secondary | ICD-10-CM | POA: Diagnosis not present

## 2020-09-19 DIAGNOSIS — I10 Essential (primary) hypertension: Secondary | ICD-10-CM | POA: Diagnosis not present

## 2020-09-19 HISTORY — PX: CYSTOSCOPY/URETEROSCOPY/HOLMIUM LASER/STENT PLACEMENT: SHX6546

## 2020-09-19 SURGERY — CYSTOSCOPY/URETEROSCOPY/HOLMIUM LASER/STENT PLACEMENT
Anesthesia: General | Laterality: Left

## 2020-09-19 MED ORDER — ACETAMINOPHEN 500 MG PO TABS: 1000.0000 mg | ORAL_TABLET | Freq: Once | ORAL | Status: DC | PRN

## 2020-09-19 MED ORDER — ONDANSETRON HCL 4 MG/2ML IJ SOLN
INTRAMUSCULAR | Status: DC | PRN
  Administered 2020-09-19: 4 mg via INTRAVENOUS

## 2020-09-19 MED ORDER — LIDOCAINE 2% (20 MG/ML) 5 ML SYRINGE
INTRAMUSCULAR | Status: AC
  Filled 2020-09-19: qty 5

## 2020-09-19 MED ORDER — ONDANSETRON HCL 4 MG/2ML IJ SOLN
INTRAMUSCULAR | Status: AC
  Filled 2020-09-19: qty 2

## 2020-09-19 MED ORDER — CEFAZOLIN SODIUM-DEXTROSE 2-4 GM/100ML-% IV SOLN
2.0000 g | INTRAVENOUS | Status: AC
  Administered 2020-09-19: 2 g via INTRAVENOUS
  Filled 2020-09-19: qty 100

## 2020-09-19 MED ORDER — FENTANYL CITRATE (PF) 100 MCG/2ML IJ SOLN
INTRAMUSCULAR | Status: DC | PRN
  Administered 2020-09-19: 25 ug via INTRAVENOUS
  Administered 2020-09-19: 50 ug via INTRAVENOUS
  Administered 2020-09-19: 25 ug via INTRAVENOUS

## 2020-09-19 MED ORDER — ORAL CARE MOUTH RINSE: 15.0000 mL | Freq: Once | OROMUCOSAL | Status: AC

## 2020-09-19 MED ORDER — FENTANYL CITRATE (PF) 100 MCG/2ML IJ SOLN
INTRAMUSCULAR | Status: AC
  Administered 2020-09-19: 50 ug via INTRAVENOUS
  Filled 2020-09-19: qty 2

## 2020-09-19 MED ORDER — EPHEDRINE 5 MG/ML INJ
INTRAVENOUS | Status: AC
  Filled 2020-09-19: qty 10

## 2020-09-19 MED ORDER — ACETAMINOPHEN 10 MG/ML IV SOLN: 1000.0000 mg | Freq: Once | INTRAVENOUS | Status: DC | PRN

## 2020-09-19 MED ORDER — DEXAMETHASONE SODIUM PHOSPHATE 10 MG/ML IJ SOLN
INTRAMUSCULAR | Status: AC
  Filled 2020-09-19: qty 1

## 2020-09-19 MED ORDER — ACETAMINOPHEN 160 MG/5ML PO SOLN: 1000.0000 mg | Freq: Once | ORAL | Status: DC | PRN

## 2020-09-19 MED ORDER — SODIUM CHLORIDE 0.9% FLUSH: 3.0000 mL | Freq: Two times a day (BID) | INTRAVENOUS | Status: DC

## 2020-09-19 MED ORDER — PROPOFOL 10 MG/ML IV BOLUS
INTRAVENOUS | Status: DC | PRN
  Administered 2020-09-19: 140 mg via INTRAVENOUS

## 2020-09-19 MED ORDER — DEXAMETHASONE SODIUM PHOSPHATE 10 MG/ML IJ SOLN
INTRAMUSCULAR | Status: DC | PRN
  Administered 2020-09-19: 10 mg via INTRAVENOUS

## 2020-09-19 MED ORDER — LIDOCAINE 2% (20 MG/ML) 5 ML SYRINGE
INTRAMUSCULAR | Status: DC | PRN
  Administered 2020-09-19: 60 mg via INTRAVENOUS

## 2020-09-19 MED ORDER — CHLORHEXIDINE GLUCONATE 0.12 % MT SOLN
15.0000 mL | Freq: Once | OROMUCOSAL | Status: AC
  Administered 2020-09-19: 15 mL via OROMUCOSAL

## 2020-09-19 MED ORDER — OXYCODONE HCL 5 MG PO TABS: 5.0000 mg | ORAL_TABLET | Freq: Once | ORAL | Status: DC | PRN

## 2020-09-19 MED ORDER — LACTATED RINGERS IV SOLN: INTRAVENOUS | Status: DC

## 2020-09-19 MED ORDER — PROPOFOL 10 MG/ML IV BOLUS
INTRAVENOUS | Status: AC
  Filled 2020-09-19: qty 20

## 2020-09-19 MED ORDER — EPHEDRINE SULFATE-NACL 50-0.9 MG/10ML-% IV SOSY
PREFILLED_SYRINGE | INTRAVENOUS | Status: DC | PRN
  Administered 2020-09-19 (×3): 10 mg via INTRAVENOUS

## 2020-09-19 MED ORDER — IOHEXOL 300 MG/ML  SOLN
INTRAMUSCULAR | Status: DC | PRN
  Administered 2020-09-19: 10 mL

## 2020-09-19 MED ORDER — OXYCODONE HCL 5 MG/5ML PO SOLN: 5.0000 mg | Freq: Once | ORAL | Status: DC | PRN

## 2020-09-19 MED ORDER — STERILE WATER FOR IRRIGATION IR SOLN
Status: DC | PRN
  Administered 2020-09-19: 3000 mL

## 2020-09-19 MED ORDER — FENTANYL CITRATE (PF) 100 MCG/2ML IJ SOLN
25.0000 ug | INTRAMUSCULAR | Status: DC | PRN
  Administered 2020-09-19: 50 ug via INTRAVENOUS

## 2020-09-19 MED ORDER — FENTANYL CITRATE (PF) 100 MCG/2ML IJ SOLN
INTRAMUSCULAR | Status: AC
  Filled 2020-09-19: qty 2

## 2020-09-19 MED ORDER — SODIUM CHLORIDE 0.9 % IR SOLN
Status: DC | PRN
  Administered 2020-09-19: 3000 mL via INTRAVESICAL

## 2020-09-19 SURGICAL SUPPLY — 24 items
BAG URO CATCHER STRL LF (MISCELLANEOUS) ×3 IMPLANT
BASKET STONE NCOMPASS (UROLOGICAL SUPPLIES) IMPLANT
CATH URET 5FR 28IN OPEN ENDED (CATHETERS) ×2 IMPLANT
CATH URET DUAL LUMEN 6-10FR 50 (CATHETERS) IMPLANT
CLOTH BEACON ORANGE TIMEOUT ST (SAFETY) ×3 IMPLANT
COVER DOME SNAP 22 D (MISCELLANEOUS) ×2 IMPLANT
EXTRACTOR STONE NITINOL NGAGE (UROLOGICAL SUPPLIES) ×2 IMPLANT
GLOVE SURG POLYISO LF SZ8 (GLOVE) ×3 IMPLANT
GOWN STRL REUS W/TWL XL LVL3 (GOWN DISPOSABLE) ×3 IMPLANT
GUIDEWIRE STR DUAL SENSOR (WIRE) ×3 IMPLANT
IV NS IRRIG 3000ML ARTHROMATIC (IV SOLUTION) ×3 IMPLANT
KIT TURNOVER KIT A (KITS) ×3 IMPLANT
LASER FIB FLEXIVA PULSE ID 365 (Laser) IMPLANT
LASER FIB FLEXIVA PULSE ID 550 (Laser) IMPLANT
LASER FIB FLEXIVA PULSE ID 910 (Laser) IMPLANT
MANIFOLD NEPTUNE II (INSTRUMENTS) ×3 IMPLANT
PACK CYSTO (CUSTOM PROCEDURE TRAY) ×3 IMPLANT
SHEATH URETERAL 12FRX35CM (MISCELLANEOUS) ×2 IMPLANT
STENT URET 6FRX26 CONTOUR (STENTS) ×2 IMPLANT
TRACTIP FLEXIVA PULS ID 200XHI (Laser) IMPLANT
TRACTIP FLEXIVA PULSE ID 200 (Laser) ×2 IMPLANT
TUBING CONNECTING 10 (TUBING) ×2 IMPLANT
TUBING CONNECTING 10' (TUBING) ×1
TUBING UROLOGY SET (TUBING) ×3 IMPLANT

## 2020-09-19 NOTE — Discharge Instructions (Signed)
You may resume Xarelto in 48 hours if you are not bleeding.     Please bring the stone fragments to the office at follow up.  You will be having office cystoscopy to removed the stent at your follow up appointment.

## 2020-09-19 NOTE — Telephone Encounter (Signed)
    [name] [name] DOB:  05-22-1945  MRN:  [id number]   Primary Cardiologist: Dr. Lovena Le  Chart reviewed as part of pre-operative protocol coverage. Given past medical history and time since last visit, based on ACC/AHA guidelines, [name] [name] would be at acceptable risk for the planned procedure without further cardiovascular testing.    75 year old male with history of Afib, atrial flutter s/p ablation, CVA in 2011, sinus node dysfunction, HTN, and HLD who is scheduled to undergo a ureteroscopy on 09/19/2020. Urology has requested Xarelto be held 3 days prior.    Per pharmacy: Procedure: uteroscopy Date of procedure: 09/19/20   CHA2DS2-VASc Score = 5  This indicates a 7.2% annual risk of stroke. The patient's score is based upon: CHF History: No HTN History: Yes Diabetes History: No Stroke History: Yes Vascular Disease History: No Age Score: 2 Gender Score: 0   CrCl 72 ml/min Platelet count 227  Case reviewed with Dr. Lovena Le who states that [place] may be held up to 3 days prior to procedure then resumed as soon as possible once bleeding risk has reduced.   I will route this recommendation to the requesting party via Epic fax function and remove from pre-op pool.  Please call with questions.  Kathyrn Drown, NP 09/19/2020, 8:18 AM

## 2020-09-19 NOTE — Op Note (Signed)
Procedure: 1.  Cystoscopy with bladder biopsy and fulguration. 2.  Cystoscopy with bilateral retrograde pyelography and interpretation. 3.  Left ureteroscopy with holmium laser application, stone extraction and insertion of left double-J stent. 4.  Application of fluoroscopy.  Preop diagnosis: Gross hematuria with a left renal pelvic stone.  Postop diagnosis: Same with 3 small hemorrhagic lesions of the bladder wall.  Surgeon: [name] [name].  Anesthesia: General.  Specimen: Bladder biopsies from the right lateral wall and posterior wall.  Drain: 6 [place] by 26 cm left contour double-J stent.  Complications: None.  EBL: None.  Indications: [name] [name] is a 75 year old male with a history of prostate cancer treated with radical prostatectomy.  He has had recent gross hematuria and the only finding to date has been a 4 mm left renal pelvic stone that was nonobstructing.  It was felt that cystoscopy with retrograde pyelography and left ureteroscopy with stone extraction were indicated.  Procedure: He was given 2 g of Ancef.  A general anesthetic was induced.  He was placed in lithotomy position and fitted with PAS hose.  His perineum and genitalia were prepped with Betadine solution he was draped in usual sterile fashion.  Cystoscopy was performed and the 21 [place] scope and 30 degree lens.  The urethra was unremarkable.  The external sphincter was intact.  There was no significant bladder neck contraction but passage of the scope through the bladder neck did tear a little bit of the mucosa posteriorly.  Inspection of bladder wall revealed minimal trabeculation.  There were small hemorrhagic lesions approximately 3 to 5 mm in size on the right lateral wall, posterior wall and on the lateral left of the right ureteral orifice.  Ureteral orifices were otherwise unremarkable.  A left retrograde pyelogram was then performed with a 5 [place] open-ended catheter and Omnipaque.  Left retrograde  pyelogram revealed a normal caliber ureter up to the proximal third of the proximal ureter where a filling defect was noted consistent with the stone.  This flushed back into the kidney during the retrograde.  Because of the small lesion noted at the ureteral orifice on the right, a right retrograde pyelogram was performed with the 5 [place] open-ended catheter and Omnipaque.  Right retrograde pyelogram demonstrated a normal ureter and intrarenal collecting system without filling defects.  A cup biopsy forceps was then used to obtain biopsies from the lesion on the posterior wall and one of the 2 right lateral wall lesions.  The irrigant was changed from saline to sterile water and a Bugbee electrode was then used to fulgurate the 2 biopsy sites.  The lesion on the lip of the ureter appeared very unlikely to be neoplastic so I did not feel that it was worth risking fulguration in that area but I did fulgurate one additional small area on the right lateral wall that was about 2 mm in size.  This point the irrigant was changed back to saline and a sensor wire was advanced to the left kidney under fluoroscopic guidance.  The 12 French inner core of a 35 cm digital access sheath was then advanced to the kidney under fluoroscopic guidance.  I was able to get it into the lower proximal ureter.  The assembled sheath was then advanced but would only go to the mid ureter.  The inner core and wire were removed and the dual-lumen digital flexible scope was passed through the sheath and easily advanced to the kidney.  There was some mild mucosal tearing at  the proximal extent of the sheath passage.  The stone was identified in the mid upper calyx and was then fragmented using a 200 m tract of laser with the [facility] holmium laser device.  The laser was set on point 2 J and 70 Hz.  The stone fragmented readily.  An engage basket was then used to remove all significant fragments.  Once final inspection demonstrated no  significant fragments the ureter was inspected and it was felt that stenting was indicated.  A guidewire was passed through the ureteroscope to the kidney with visual and fluoroscopic guidance.  The cystoscope was then reinserted over the wire and a 6 [place] by 26 cm contour double-J stent was passed left kidney under fluoroscopic guidance.  The wire was removed, leaving a good coil in the kidney and a good coil in the bladder.  The bladder was drained and the cystoscope was removed.  He was taken down from lithotomy position, his anesthetic was reversed and he was moved to recovery room in stable condition.  There were no complications.

## 2020-09-19 NOTE — Transfer of Care (Signed)
Immediate Anesthesia Transfer of Care Note  Patient: [name] [name]  Procedure(s) Performed: CYSTOSCOPY RETROGRADE PYELOGRAM URETEROSCOPY/HOLMIUM LASER/STENT PLACEMENT (Left)  Patient Location: PACU  Anesthesia Type:General  Level of Consciousness: awake, alert  and oriented  Airway & Oxygen Therapy: Patient Spontanous Breathing and Patient connected to face mask oxygen  Post-op Assessment: Report given to RN and Post -op Vital signs reviewed and stable  Post vital signs: Reviewed and stable  Last Vitals:  Vitals Value Taken Time  BP 152/92 09/19/20 1056  Temp    Pulse 67 09/19/20 1057  Resp    SpO2 97 % 09/19/20 1057  Vitals shown include unvalidated device data.  Last Pain:  Vitals:   09/19/20 0805  TempSrc:   PainSc: 0-No pain         Complications: No notable events documented.

## 2020-09-19 NOTE — Interval H&P Note (Signed)
History and Physical Interval Note:  He has had a little less bleeding and no pain.   09/19/2020 9:05 AM  [name] [name]  has presented today for surgery, with the diagnosis of LEFT RENAL STONE GROSS HEMATURIA.  The various methods of treatment have been discussed with the patient and family. After consideration of risks, benefits and other options for treatment, the patient has consented to  Procedure(s): CYSTOSCOPY RETROGRADE PYELOGRAM URETEROSCOPY/HOLMIUM LASER/STENT PLACEMENT (Left) as a surgical intervention.  The patient's history has been reviewed, patient examined, no change in status, stable for surgery.  I have reviewed the patient's chart and labs.  Questions were answered to the patient's satisfaction.     Irine Seal

## 2020-09-19 NOTE — Anesthesia Procedure Notes (Signed)
Procedure Name: LMA Insertion Date/Time: 09/19/2020 9:39 AM Performed by: Jabar Krysiak D, CRNA Pre-anesthesia Checklist: Patient identified, Emergency Drugs available, Suction available and Patient being monitored Patient Re-evaluated:Patient Re-evaluated prior to induction Oxygen Delivery Method: Circle system utilized Preoxygenation: Pre-oxygenation with 100% oxygen Induction Type: IV induction Ventilation: Mask ventilation without difficulty LMA: LMA inserted LMA Size: 4.0 Tube type: Oral Number of attempts: 1 Placement Confirmation: positive ETCO2 and breath sounds checked- equal and bilateral Tube secured with: Tape Dental Injury: Teeth and Oropharynx as per pre-operative assessment

## 2020-09-19 NOTE — Anesthesia Preprocedure Evaluation (Signed)
Anesthesia Evaluation  Patient identified by MRN, date of birth, ID band Patient awake    Reviewed: Allergy & Precautions, NPO status , Patient's Chart, lab work & pertinent test results  History of Anesthesia Complications Negative for: history of anesthetic complications  Airway Mallampati: IV  TM Distance: >3 FB Neck ROM: Full    Dental  (+) Dental Advisory Given, Teeth Intact   Pulmonary neg shortness of breath, neg sleep apnea, neg COPD, neg recent URI,  Covid-19 Nucleic Acid Test Results No results found for: SARSCOV2NAA, SARSCOV2    breath sounds clear to auscultation       Cardiovascular hypertension, Pt. on medications + dysrhythmias Atrial Fibrillation  Rhythm:Regular  Left ventricle: The cavity size was normal. Wall thickness was   increased in a pattern of mild LVH. Systolic function was normal.   The estimated ejection fraction was in the range of 55% to 60%.   Wall motion was normal; there were no regional wall motion   abnormalities.  - Mitral valve: Mild regurgitation.  - Left atrium: The atrium was moderately dilated.  - Right ventricle: The cavity size was mildly dilated.  - Right atrium: The atrium was mildly dilated.  - Pulmonary arteries: Systolic pressure was mildly increased    Neuro/Psych neg Seizures TIAnegative psych ROS   GI/Hepatic negative GI ROS,   Endo/Other  negative endocrine ROS  Renal/GU Renal disease     Musculoskeletal negative musculoskeletal ROS (+)   Abdominal   Peds  Hematology Lab Results      Component                Value               Date                      WBC                      6.7                 09/07/2020                HGB                      15.8                09/07/2020                HCT                      47.8                09/07/2020                MCV                      93.0                09/07/2020                PLT                       207                 09/07/2020           xarelto   Anesthesia Other Findings   Reproductive/Obstetrics  Anesthesia Physical Anesthesia Plan  ASA: 2  Anesthesia Plan: General   Post-op Pain Management:    Induction: Intravenous  PONV Risk Score and Plan: 2 and Ondansetron and Dexamethasone  Airway Management Planned: LMA  Additional Equipment: None  Intra-op Plan:   Post-operative Plan: Extubation in OR  Informed Consent: I have reviewed the patients History and Physical, chart, labs and discussed the procedure including the risks, benefits and alternatives for the proposed anesthesia with the patient or authorized representative who has indicated his/her understanding and acceptance.     Dental advisory given  Plan Discussed with: CRNA and Surgeon  Anesthesia Plan Comments:         Anesthesia Quick Evaluation

## 2020-09-20 ENCOUNTER — Encounter (HOSPITAL_COMMUNITY): Payer: Self-pay | Admitting: Urology

## 2020-09-20 LAB — SURGICAL PATHOLOGY

## 2020-09-24 NOTE — Anesthesia Postprocedure Evaluation (Signed)
Anesthesia Post Note  Patient: Delon Revelo Mackins  Procedure(s) Performed: CYSTOSCOPY RETROGRADE PYELOGRAM URETEROSCOPY/HOLMIUM LASER/STENT PLACEMENT (Left)     Patient location during evaluation: PACU Anesthesia Type: General Level of consciousness: awake and alert Pain management: pain level controlled Vital Signs Assessment: post-procedure vital signs reviewed and stable Respiratory status: spontaneous breathing, nonlabored ventilation, respiratory function stable and patient connected to nasal cannula oxygen Cardiovascular status: blood pressure returned to baseline and stable Postop Assessment: no apparent nausea or vomiting Anesthetic complications: no   No notable events documented.  Last Vitals:  Vitals:   09/19/20 1130 09/19/20 1148  BP: (!) 148/88 (!) 161/85  Pulse: 63 66  Resp: 13 16  Temp: 36.6 C 36.6 C  SpO2: 95% 93%    Last Pain:  Vitals:   09/19/20 1148  TempSrc:   PainSc: 0-No pain                 Morganne Haile

## 2020-09-25 ENCOUNTER — Telehealth: Payer: Self-pay | Admitting: Internal Medicine

## 2020-09-25 NOTE — Telephone Encounter (Signed)
Pt c/o medication issue:  1. Name of Medication:  rivaroxaban (XARELTO) 20 MG TABS tablet  2. How are you currently taking this medication (dosage and times per day)?  Patient has not been taking   3. Are you having a reaction (difficulty breathing--STAT)?  No   4. What is your medication issue?  Patient had a ureteroscopy on 09/19/20. He states he has been bleeding on and off since 09/21/20. He held his Xarelto 2 days post-op then when he went back on it he started bleeding. Denies pain. States he tried holding Xarelto again and the bleeding subsided, but when he went back on it he started bleeding again. Patient doesn't want to go too long without taking his medication, but is also unsure when the bleeding will stop. Please advise.

## 2020-09-25 NOTE — Telephone Encounter (Signed)
Left message for patient to call back  

## 2020-09-26 DIAGNOSIS — N202 Calculus of kidney with calculus of ureter: Secondary | ICD-10-CM | POA: Diagnosis not present

## 2020-09-26 DIAGNOSIS — N200 Calculus of kidney: Secondary | ICD-10-CM | POA: Diagnosis not present

## 2020-09-26 DIAGNOSIS — R310 Gross hematuria: Secondary | ICD-10-CM | POA: Diagnosis not present

## 2020-09-27 NOTE — Telephone Encounter (Signed)
Follow up:    Patient would like to speak with a regarding the message that was sent threw mychart.

## 2020-09-27 NOTE — Telephone Encounter (Signed)
Sent mychart message

## 2020-09-27 NOTE — Telephone Encounter (Signed)
Returned call to Pt.  Pt concerned that he has been off Xarelto for some days being treated for kidney stones.  When he would try to restart Xarelto would have hematuria.  He recently had stent removed and has been advised to hold Xarelto a few more days.  Advised Pt that he should follow urologist guidance.  Pt was concerned which was more important-bleeding vs stroke risk.  All questions answered.  Pt will continue to follow urologist guidance on when to restart Xarelto.

## 2020-10-03 ENCOUNTER — Other Ambulatory Visit: Payer: Self-pay | Admitting: Family Medicine

## 2020-10-05 NOTE — Telephone Encounter (Signed)
PDMP reviewed.  Not comfortable filling at previous quantity - appears quantity was recently increased from 28 tablets to 120 tablets and unclear why.  Will refill at previous quantity as PCP is out of office and patient can address with PCP upon their return.

## 2020-10-10 ENCOUNTER — Encounter: Payer: Self-pay | Admitting: Family Medicine

## 2020-10-10 MED ORDER — TRAMADOL HCL 50 MG PO TABS: 50.0000 mg | ORAL_TABLET | Freq: Four times a day (QID) | ORAL | 0 refills | Status: DC | PRN

## 2020-10-13 ENCOUNTER — Encounter: Payer: Self-pay | Admitting: Family Medicine

## 2020-10-13 ENCOUNTER — Ambulatory Visit (INDEPENDENT_AMBULATORY_CARE_PROVIDER_SITE_OTHER): Payer: Medicare Other | Admitting: Family Medicine

## 2020-10-13 ENCOUNTER — Other Ambulatory Visit: Payer: Self-pay

## 2020-10-13 VITALS — BP 130/72 | HR 82 | Temp 98.2°F | Resp 14 | Ht 70.0 in | Wt 212.0 lb

## 2020-10-13 DIAGNOSIS — M5431 Sciatica, right side: Secondary | ICD-10-CM | POA: Diagnosis not present

## 2020-10-13 MED ORDER — PREDNISONE 20 MG PO TABS: ORAL_TABLET | ORAL | 0 refills | Status: DC

## 2020-10-13 NOTE — Progress Notes (Signed)
Subjective:    Patient ID: [name] [name], male    DOB: [birth year], 75 y.o.   MRN: [id number]  HPI Patient is a 75 year old white male who presents today with back pain.  He states that he had surgery in 2009.  At that time he has slipped disc between L4-L5 causing right-sided sciatica.  Dr. Ellene Route repaired this for him and he had done well since.  However starting approximately 1 year ago he developed severe pain in his lower back roughly at the same level of L4-L5.  The pain is now located near the right SI joint.  There is no tenderness to palpation in that area however he has aching and throbbing pain in that area that is made worse by bending forward or trying to pick up an object.  He also reports pain radiating into his right gluteus.  He denies any weakness or numbness in his right leg.  However he has a positive straight leg raise in the right leg today.  He also has a positive contralateral straight leg raise.  Reflexes however at the knee and the Achilles are normal.  He denies any falls or injuries.  He denies any rash.  He has been taking tramadol and Tylenol for pain with very little relief.  He cannot take NSAIDs due to the fact he is on anticoagulation.  The pain has been present for over a year but has worsened over the last 2 to 3 months.  Past Medical History:  Diagnosis Date   CVA (cerebral infarction) 07-14-09   Dysrhythmia    History of kidney stones    Hyperlipidemia    Hypertension    Nephrolithiasis    Paroxysmal atrial fibrillation (HCC)    Paroxysmal atrial flutter ([place])    a. s/p ablation 2012.   Prostate cancer [facility])    Stroke [facility])    Past Surgical History:  Procedure Laterality Date   APPENDECTOMY     BACK SURGERY     CYSTOSCOPY/URETEROSCOPY/HOLMIUM LASER/STENT PLACEMENT Left 09/19/2020   Procedure: CYSTOSCOPY RETROGRADE PYELOGRAM URETEROSCOPY/HOLMIUM LASER/STENT PLACEMENT;  Surgeon: Irine Seal, MD;  Location: [facility];  Service: Urology;  Laterality: Left;    EYE SURGERY     PROSTATECTOMY  2005   Current Outpatient Medications on File Prior to Visit  Medication Sig Dispense Refill   atorvastatin (LIPITOR) 20 MG tablet Take 1 tablet (20 mg total) by mouth daily. 90 tablet 3   flecainide (TAMBOCOR) 150 MG tablet TAKE 1 TABLET BY MOUTH TWICE DAILY. MUST KEEP APPT IN MARCH 2022 BEFORE REFILLS 60 tablet 11   losartan (COZAAR) 50 MG tablet Take 1 tablet (50 mg total) by mouth daily. 90 tablet 2   milk thistle 175 MG tablet Take 175 mg by mouth daily.     rivaroxaban (XARELTO) 20 MG TABS tablet TAKE 1 TABLET BY MOUTH EVERY DAY WITH SUPPER.  Pt is Overdue for follow-up, MUST see MD for FUTURE refills. 90 tablet 0   traMADol (ULTRAM) 50 MG tablet Take 1 tablet (50 mg total) by mouth every 6 (six) hours as needed. 120 tablet 0   No current facility-administered medications on file prior to visit.   No Known Allergies Social History   Socioeconomic History   Marital status: Married    Spouse name: Not on file   Number of children: 2   Years of education: Not on file   Highest education level: Not on file  Occupational History   Occupation: Clinical research associate  Employer: [employer]  Tobacco Use   Smoking status: Never   Smokeless tobacco: Never  Vaping Use   Vaping Use: Never used  Substance and Sexual Activity   Alcohol use: No   Drug use: No   Sexual activity: Not on file  Other Topics Concern   Not on file  Social History Narrative   Not on file   Social Determinants of Health   Financial Resource Strain: Not on file  Food Insecurity: Not on file  Transportation Needs: Not on file  Physical Activity: Not on file  Stress: Not on file  Social Connections: Not on file  Intimate Partner Violence: Not on file    Review of Systems     Objective:   Physical Exam Constitutional:      Appearance: Normal appearance.  Cardiovascular:     Rate and Rhythm: Normal rate.     Heart sounds: Normal heart sounds.  Pulmonary:      Effort: Pulmonary effort is normal.     Breath sounds: Normal breath sounds.  Musculoskeletal:     Lumbar back: No swelling, deformity, spasms, tenderness or bony tenderness. Decreased range of motion. Positive right straight leg raise test and positive left straight leg raise test.       Back:  Neurological:     Mental Status: He is alert.       Assessment & Plan:  Right sided sciatica - Plan: DG Lumbar Spine Complete  Patient is having right-sided sciatica and right-sided low back pain.  This is worsening despite using tramadol and Tylenol over the last year.  He has now unable to bend over.  He has to get on his knee to pick something up off the floor.  The pain is causing significant impairment in his quality of life.  Begin by obtaining an x-ray of the lumbar spine.  If the x-ray of the lumbar spine shows no significant abnormality, I would recommend a referral for an MRI of the lumbar spine to evaluate further.  He is already made an appointment to see his neurosurgeon in 1 month.  They have requested that we get neuroimaging first.

## 2020-10-17 ENCOUNTER — Other Ambulatory Visit: Payer: Self-pay

## 2020-10-17 ENCOUNTER — Ambulatory Visit
Admission: RE | Admit: 2020-10-17 | Discharge: 2020-10-17 | Disposition: A | Discharge status: Home / Self Care | Payer: Medicare Other | Source: Ambulatory Visit | Attending: Family Medicine | Admitting: Family Medicine

## 2020-10-17 DIAGNOSIS — M5431 Sciatica, right side: Secondary | ICD-10-CM

## 2020-10-17 DIAGNOSIS — M545 Low back pain, unspecified: Secondary | ICD-10-CM | POA: Diagnosis not present

## 2020-10-20 ENCOUNTER — Other Ambulatory Visit: Payer: Self-pay | Admitting: Family Medicine

## 2020-10-20 DIAGNOSIS — M5431 Sciatica, right side: Secondary | ICD-10-CM

## 2020-10-25 ENCOUNTER — Telehealth: Payer: Self-pay | Admitting: *Deleted

## 2020-10-25 NOTE — Telephone Encounter (Signed)
Received call from patient.   Reports that he continues to have increased pain in back.   Requested order for Hydrocodone/APAP and muscle relaxer.   Please advise.

## 2020-10-26 ENCOUNTER — Other Ambulatory Visit: Payer: Self-pay | Admitting: Family Medicine

## 2020-10-26 MED ORDER — HYDROCODONE-ACETAMINOPHEN 5-325 MG PO TABS: 1.0000 | ORAL_TABLET | Freq: Four times a day (QID) | ORAL | 0 refills | Status: DC | PRN

## 2020-10-26 MED ORDER — CYCLOBENZAPRINE HCL 10 MG PO TABS: 10.0000 mg | ORAL_TABLET | Freq: Three times a day (TID) | ORAL | 0 refills | Status: DC | PRN

## 2020-11-01 ENCOUNTER — Other Ambulatory Visit: Payer: Self-pay

## 2020-11-01 ENCOUNTER — Ambulatory Visit
Admission: RE | Admit: 2020-11-01 | Discharge: 2020-11-01 | Disposition: A | Discharge status: Home / Self Care | Payer: Medicare Other | Source: Ambulatory Visit | Attending: Family Medicine | Admitting: Family Medicine

## 2020-11-01 DIAGNOSIS — M48061 Spinal stenosis, lumbar region without neurogenic claudication: Secondary | ICD-10-CM | POA: Diagnosis not present

## 2020-11-01 DIAGNOSIS — M545 Low back pain, unspecified: Secondary | ICD-10-CM | POA: Diagnosis not present

## 2020-11-01 DIAGNOSIS — M5431 Sciatica, right side: Secondary | ICD-10-CM

## 2020-11-02 ENCOUNTER — Telehealth: Payer: Self-pay | Admitting: *Deleted

## 2020-11-02 NOTE — Telephone Encounter (Signed)
Received PA determination.   PA approved 11/02/2020- 11/02/2021.

## 2020-11-02 NOTE — Telephone Encounter (Signed)
Received request from pharmacy for PA on Flexeril.   PA submitted.   Dx: M54.31- R sided sciatica  Your information has been submitted to [employer]. [employer] [place] will review the request and notify you of the determination decision directly, typically within 3 business days of your submission and once all necessary information is received.  You will also receive your request decision electronically. To check for an update later, open the request again from your dashboard.  If [employer] has not responded within the specified timeframe or if you have any questions about your PA submission, contact Franklin C[place] directly at [facility]) [phone] or ([place]) [phone].

## 2020-12-06 DIAGNOSIS — Z8546 Personal history of malignant neoplasm of prostate: Secondary | ICD-10-CM | POA: Diagnosis not present

## 2020-12-06 DIAGNOSIS — N393 Stress incontinence (female) (male): Secondary | ICD-10-CM | POA: Diagnosis not present

## 2020-12-06 DIAGNOSIS — N200 Calculus of kidney: Secondary | ICD-10-CM | POA: Diagnosis not present

## 2020-12-13 ENCOUNTER — Other Ambulatory Visit: Payer: Self-pay | Admitting: Family Medicine

## 2020-12-14 NOTE — Telephone Encounter (Signed)
Ok to refill??  Last office visit 10/13/2020.  Last refill 10/10/2020.

## 2020-12-15 DIAGNOSIS — G96191 Perineural cyst: Secondary | ICD-10-CM | POA: Diagnosis not present

## 2020-12-15 DIAGNOSIS — Z6829 Body mass index (BMI) 29.0-29.9, adult: Secondary | ICD-10-CM | POA: Insufficient documentation

## 2020-12-15 DIAGNOSIS — I10 Essential (primary) hypertension: Secondary | ICD-10-CM | POA: Diagnosis not present

## 2021-01-29 ENCOUNTER — Other Ambulatory Visit: Payer: Self-pay

## 2021-01-29 MED ORDER — RIVAROXABAN 20 MG PO TABS: ORAL_TABLET | ORAL | 0 refills | Status: DC

## 2021-01-29 NOTE — Telephone Encounter (Signed)
Prescription refill request for Xarelto received.  Indication:  Last office visit:06/19/20 [name] [name])  Weight: 96.2kg Age: 75 Scr: 0.93 (09/07/20) CrCl: 90.87ml/min  Appropriate dose and refill sent to requested pharmacy.

## 2021-02-19 DIAGNOSIS — L570 Actinic keratosis: Secondary | ICD-10-CM | POA: Diagnosis not present

## 2021-02-19 DIAGNOSIS — L738 Other specified follicular disorders: Secondary | ICD-10-CM | POA: Diagnosis not present

## 2021-02-19 DIAGNOSIS — Z85828 Personal history of other malignant neoplasm of skin: Secondary | ICD-10-CM | POA: Diagnosis not present

## 2021-02-19 DIAGNOSIS — L821 Other seborrheic keratosis: Secondary | ICD-10-CM | POA: Diagnosis not present

## 2021-02-19 DIAGNOSIS — D1801 Hemangioma of skin and subcutaneous tissue: Secondary | ICD-10-CM | POA: Diagnosis not present

## 2021-02-19 DIAGNOSIS — D225 Melanocytic nevi of trunk: Secondary | ICD-10-CM | POA: Diagnosis not present

## 2021-03-08 ENCOUNTER — Ambulatory Visit (INDEPENDENT_AMBULATORY_CARE_PROVIDER_SITE_OTHER): Payer: Medicare Other

## 2021-03-08 ENCOUNTER — Other Ambulatory Visit: Payer: Self-pay

## 2021-03-08 ENCOUNTER — Other Ambulatory Visit: Payer: Self-pay | Admitting: Family Medicine

## 2021-03-08 ENCOUNTER — Telehealth: Payer: Self-pay

## 2021-03-08 VITALS — Ht 70.0 in | Wt 205.0 lb

## 2021-03-08 DIAGNOSIS — Z0000 Encounter for general adult medical examination without abnormal findings: Secondary | ICD-10-CM

## 2021-03-08 DIAGNOSIS — G96191 Perineural cyst: Secondary | ICD-10-CM | POA: Insufficient documentation

## 2021-03-08 MED ORDER — TRAMADOL HCL 50 MG PO TABS: ORAL_TABLET | ORAL | 0 refills | Status: DC

## 2021-03-08 NOTE — Patient Instructions (Signed)
[name] [name] , Thank you for taking time to come for your Medicare Wellness Visit. I appreciate your ongoing commitment to your health goals. Please review the following plan we discussed and let me know if I can assist you in the future.   Screening recommendations/referrals: Colonoscopy: No longer required due to age.   Recommended yearly ophthalmology/optometry visit for glaucoma screening and checkup Recommended yearly dental visit for hygiene and checkup  Vaccinations: Influenza vaccine: Declined.  Pneumococcal vaccine: Done 02/10/2015 and 06/01/2018 Tdap vaccine: Due Repeat in 10 years  Shingles vaccine: Zoster done 01/13/2011 Shingrix discussed. Please contact your pharmacy for coverage information.     Covid-19: Done 05/01/2019, 05/31/2019 and 02/29/2020  Advanced directives: Please bring a copy of your health care power of attorney and living will to the office to be added to your chart at your convenience.   Conditions/risks identified: Aim for 30 minutes of exercise or brisk walking each day, drink 6-8 glasses of water and eat lots of fruits and vegetables.   Next appointment: Follow up in one year for your annual wellness visit. 2023.  Preventive Care 39 Years and Older, Male  Preventive care refers to lifestyle choices and visits with your health care provider that can promote health and wellness. What does preventive care include? A yearly physical exam. This is also called an annual well check. Dental exams once or twice a year. Routine eye exams. Ask your health care provider how often you should have your eyes checked. Personal lifestyle choices, including: Daily care of your teeth and gums. Regular physical activity. Eating a healthy diet. Avoiding tobacco and drug use. Limiting alcohol use. Practicing safe sex. Taking low doses of aspirin every day. Taking vitamin and mineral supplements as recommended by your health care provider. What happens during an annual  well check? The services and screenings done by your health care provider during your annual well check will depend on your age, overall health, lifestyle risk factors, and family history of disease. Counseling  Your health care provider may ask you questions about your: Alcohol use. Tobacco use. Drug use. Emotional well-being. Home and relationship well-being. Sexual activity. Eating habits. History of falls. Memory and ability to understand (cognition). Work and work Statistician. Screening  You may have the following tests or measurements: Height, weight, and BMI. Blood pressure. Lipid and cholesterol levels. These may be checked every 5 years, or more frequently if you are over 74 years old. Skin check. Lung cancer screening. You may have this screening every year starting at age 57 if you have a 30-pack-year history of smoking and currently smoke or have quit within the past 15 years. Fecal occult blood test (FOBT) of the stool. You may have this test every year starting at age 28. Flexible sigmoidoscopy or colonoscopy. You may have a sigmoidoscopy every 5 years or a colonoscopy every 10 years starting at age 74. Prostate cancer screening. Recommendations will vary depending on your family history and other risks. Hepatitis C blood test. Hepatitis B blood test. Sexually transmitted disease (STD) testing. Diabetes screening. This is done by checking your blood sugar (glucose) after you have not eaten for a while (fasting). You may have this done every 1-3 years. Abdominal aortic aneurysm (AAA) screening. You may need this if you are a current or former smoker. Osteoporosis. You may be screened starting at age 59 if you are at high risk. Talk with your health care provider about your test results, treatment options, and if necessary, the need  for more tests. Vaccines  Your health care provider may recommend certain vaccines, such as: Influenza vaccine. This is recommended every  year. Tetanus, diphtheria, and acellular pertussis (Tdap, Td) vaccine. You may need a Td booster every 10 years. Zoster vaccine. You may need this after age 79. Pneumococcal 13-valent conjugate (PCV13) vaccine. One dose is recommended after age 81. Pneumococcal polysaccharide (PPSV23) vaccine. One dose is recommended after age 70. Talk to your health care provider about which screenings and vaccines you need and how often you need them. This information is not intended to replace advice given to you by your health care provider. Make sure you discuss any questions you have with your health care provider. Document Released: 04/21/2015 Document Revised: 12/13/2015 Document Reviewed: 01/24/2015 Elsevier Interactive Patient Education  2017 [place] Prevention in the Home Falls can cause injuries. They can happen to people of all ages. There are many things you can do to make your home safe and to help prevent falls. What can I do on the outside of my home? Regularly fix the edges of walkways and driveways and fix any cracks. Remove anything that might make you trip as you walk through a door, such as a raised step or threshold. Trim any bushes or trees on the path to your home. Use bright outdoor lighting. Clear any walking paths of anything that might make someone trip, such as rocks or tools. Regularly check to see if handrails are loose or broken. Make sure that both sides of any steps have handrails. Any raised decks and porches should have guardrails on the edges. Have any leaves, snow, or ice cleared regularly. Use sand or salt on walking paths during winter. Clean up any spills in your garage right away. This includes oil or grease spills. What can I do in the bathroom? Use night lights. Install grab bars by the toilet and in the tub and shower. Do not use towel bars as grab bars. Use non-skid mats or decals in the tub or shower. If you need to sit down in the shower, use a  plastic, non-slip stool. Keep the floor dry. Clean up any water that spills on the floor as soon as it happens. Remove soap buildup in the tub or shower regularly. Attach bath mats securely with double-sided non-slip rug tape. Do not have throw rugs and other things on the floor that can make you trip. What can I do in the bedroom? Use night lights. Make sure that you have a light by your bed that is easy to reach. Do not use any sheets or blankets that are too big for your bed. They should not hang down onto the floor. Have a firm chair that has side arms. You can use this for support while you get dressed. Do not have throw rugs and other things on the floor that can make you trip. What can I do in the kitchen? Clean up any spills right away. Avoid walking on wet floors. Keep items that you use a lot in easy-to-reach places. If you need to reach something above you, use a strong step stool that has a grab bar. Keep electrical cords out of the way. Do not use floor polish or wax that makes floors slippery. If you must use wax, use non-skid floor wax. Do not have throw rugs and other things on the floor that can make you trip. What can I do with my stairs? Do not leave any items on the stairs.  Make sure that there are handrails on both sides of the stairs and use them. Fix handrails that are broken or loose. Make sure that handrails are as long as the stairways. Check any carpeting to make sure that it is firmly attached to the stairs. Fix any carpet that is loose or worn. Avoid having throw rugs at the top or bottom of the stairs. If you do have throw rugs, attach them to the floor with carpet tape. Make sure that you have a light switch at the top of the stairs and the bottom of the stairs. If you do not have them, ask someone to add them for you. What else can I do to help prevent falls? Wear shoes that: Do not have high heels. Have rubber bottoms. Are comfortable and fit you  well. Are closed at the toe. Do not wear sandals. If you use a stepladder: Make sure that it is fully opened. Do not climb a closed stepladder. Make sure that both sides of the stepladder are locked into place. Ask someone to hold it for you, if possible. Clearly mark and make sure that you can see: Any grab bars or handrails. First and last steps. Where the edge of each step is. Use tools that help you move around (mobility aids) if they are needed. These include: Canes. Walkers. Scooters. Crutches. Turn on the lights when you go into a dark area. Replace any light bulbs as soon as they burn out. Set up your furniture so you have a clear path. Avoid moving your furniture around. If any of your floors are uneven, fix them. If there are any pets around you, be aware of where they are. Review your medicines with your doctor. Some medicines can make you feel dizzy. This can increase your chance of falling. Ask your doctor what other things that you can do to help prevent falls. This information is not intended to replace advice given to you by your health care provider. Make sure you discuss any questions you have with your health care provider. Document Released: 01/19/2009 Document Revised: 08/31/2015 Document Reviewed: 04/29/2014 Elsevier Interactive Patient Education  2017 [employer].

## 2021-03-08 NOTE — Progress Notes (Signed)
Subjective:   [name] [name] is a 75 y.o. male who presents for Medicare Annual/Subsequent preventive examination. Virtual Visit via Telephone Note  I connected with  Lilia Argue Digioia on 03/08/21 at  9:45 AM EST by telephone and verified that I am speaking with the correct person using two identifiers.  Location: Patient: HOME Provider: [facility] Persons participating in the virtual visit: patient/Nurse Health Advisor   I discussed the limitations, risks, security and privacy concerns of performing an evaluation and management service by telephone and the availability of in person appointments. The patient expressed understanding and agreed to proceed.  Interactive audio and video telecommunications were attempted between this nurse and patient, however failed, due to patient having technical difficulties OR patient did not have access to video capability.  We continued and completed visit with audio only.  Some vital signs may be absent or patient reported.   Chriss Driver, LPN  Review of Systems     Cardiac Risk Factors include: advanced age (>60men, >29 women);hypertension;dyslipidemia;male gender     Objective:    Today's Vitals   03/08/21 1001  Weight: 205 lb (93 kg)  Height: 5\' 10"  (1.778 m)  PainSc: 3    Body mass index is 29.41 kg/m.  Advanced Directives 03/08/2021 09/19/2020 09/07/2020  Does Patient Have a Medical Advance Directive? Yes Yes Yes  Type of Paramedic of [place];Living will [place];Living will [place];Living will  Does patient want to make changes to medical advance directive? - No - Patient declined No - Patient declined  Copy of [place] in Chart? No - copy requested No - copy requested No - copy requested  Would patient like information on creating a medical advance directive? No - Patient declined No - Patient declined No - Patient declined    Current Medications  (verified) Outpatient Encounter Medications as of 03/08/2021  Medication Sig   atorvastatin (LIPITOR) 20 MG tablet Take 1 tablet (20 mg total) by mouth daily.   cyclobenzaprine (FLEXERIL) 10 MG tablet Take 1 tablet (10 mg total) by mouth 3 (three) times daily as needed for muscle spasms.   flecainide (TAMBOCOR) 150 MG tablet TAKE 1 TABLET BY MOUTH TWICE DAILY. MUST KEEP APPT IN MARCH 2022 BEFORE REFILLS   HYDROcodone-acetaminophen (NORCO) 5-325 MG tablet Take 1 tablet by mouth every 6 (six) hours as needed for moderate pain.   losartan (COZAAR) 50 MG tablet Take 1 tablet (50 mg total) by mouth daily.   milk thistle 175 MG tablet Take 175 mg by mouth daily.   rivaroxaban (XARELTO) 20 MG TABS tablet TAKE 1 TABLET BY MOUTH EVERY DAY WITH SUPPER.  Pt is Overdue for follow-up, MUST see MD for FUTURE refills.   traMADol (ULTRAM) 50 MG tablet TAKE 1 TABLET(50 MG) BY MOUTH EVERY 6 HOURS AS NEEDED   predniSONE (DELTASONE) 20 MG tablet 3 tabs poqday 1-2, 2 tabs poqday 3-4, 1 tab poqday 5-6 (Patient not taking: Reported on 03/08/2021)   No facility-administered encounter medications on file as of 03/08/2021.    Allergies (verified) Patient has no known allergies.   History: Past Medical History:  Diagnosis Date   CVA (cerebral infarction) 07-14-09   Dysrhythmia    History of kidney stones    Hyperlipidemia    Hypertension    Nephrolithiasis    Paroxysmal atrial fibrillation (HCC)    Paroxysmal atrial flutter ([place])    a. s/p ablation 2012.   Prostate cancer (  [place])    Stroke [facility])    Past Surgical History:  Procedure Laterality Date   APPENDECTOMY     BACK SURGERY     CYSTOSCOPY/URETEROSCOPY/HOLMIUM LASER/STENT PLACEMENT Left 09/19/2020   Procedure: CYSTOSCOPY RETROGRADE PYELOGRAM URETEROSCOPY/HOLMIUM LASER/STENT PLACEMENT;  Surgeon: Irine Seal, MD;  Location: [facility];  Service: Urology;  Laterality: Left;   EYE SURGERY     PROSTATECTOMY  2005   Family History  Problem Relation Age of Onset    Heart disease Mother 39       died   Prostate cancer Father        alive   Testicular cancer Brother        died   Social History   Socioeconomic History   Marital status: Married    Spouse name: Not on file   Number of children: 2   Years of education: Not on file   Highest education level: Not on file  Occupational History   Occupation: Surveyor, quantity: [employer]  Tobacco Use   Smoking status: Never   Smokeless tobacco: Never  Vaping Use   Vaping Use: Never used  Substance and Sexual Activity   Alcohol use: No   Drug use: No   Sexual activity: Not on file  Other Topics Concern   Not on file  Social History Narrative   Not on file   Social Determinants of Health   Financial Resource Strain: Low Risk    Difficulty of Paying Living Expenses: Not hard at all  Food Insecurity: No Food Insecurity   Worried About Charity fundraiser in the Last Year: Never true   [place] in the Last Year: Never true  Transportation Needs: No Transportation Needs   Lack of Transportation (Medical): No   Lack of Transportation (Non-Medical): No  Physical Activity: Insufficiently Active   Days of Exercise per Week: 3 days   Minutes of Exercise per Session: 20 min  Stress: No Stress Concern Present   Feeling of Stress : Not at all  Social Connections: Socially Integrated   Frequency of Communication with Friends and Family: More than three times a week   Frequency of Social Gatherings with Friends and Family: More than three times a week   Attends Religious Services: More than 4 times per year   Active Member of [employer] or Organizations: Yes   Attends Music therapist: More than 4 times per year   Marital Status: Married    Tobacco Counseling Counseling given: Not Answered   Clinical Intake:  Pre-visit preparation completed: Yes  Pain : 0-10 Pain Score: 3  Pain Type: Chronic pain Pain Location: Back Pain Descriptors / Indicators:  Aching Pain Onset: More than a month ago Pain Frequency: Intermittent     BMI - recorded: 29.41 Nutritional Status: BMI 25 -29 Overweight Nutritional Risks: None Diabetes: No  How often do you need to have someone help you when you read instructions, pamphlets, or other written materials from your doctor or pharmacy?: 1 - Never  Diabetic?NO  Interpreter Needed?: No  Information entered by :: MJ Georg Ang, LPN   Activities of Daily Living In your present state of health, do you have any difficulty performing the following activities: 03/08/2021 09/07/2020  Hearing? N N  Vision? N N  Difficulty concentrating or making decisions? N N  Walking or climbing stairs? N N  Dressing or bathing? N N  Doing errands, shopping? N -  Conservation officer, nature and  eating ? N -  Using the Toilet? N -  In the past six months, have you accidently leaked urine? Y -  Comment Since prostate surgery -  Do you have problems with loss of bowel control? N -  Managing your Medications? N -  Managing your Finances? N -  Housekeeping or managing your Housekeeping? N -  Some recent data might be hidden    Patient Care Team: Susy Frizzle, MD as PCP - General (Family Medicine) Dennard Schaumann Cammie Mcgee, MD (Family Medicine)  Indicate any recent Medical Services you may have received from other than [facility] providers in the past year (date may be approximate).     Assessment:   This is a routine wellness examination for [place].  Hearing/Vision screen Hearing Screening - Comments:: No hearing issues.  Vision Screening - Comments:: Dr. Herbert Deaner 06/16/2020  Dietary issues and exercise activities discussed: Current Exercise Habits: The patient has a physically strenuous job, but has no regular exercise apart from work., Exercise limited by: cardiac condition(s)   Goals Addressed             This Visit's Progress    DIET - REDUCE CALORIE INTAKE       Weight loss.        Depression Screen PHQ 2/9 Scores  03/08/2021 06/28/2020 06/01/2018 08/27/2016 08/27/2016 07/26/2016 02/10/2015  PHQ - 2 Score 0 0 0 0 0 0 0  PHQ- 9 Score - - - - 0 0 -    Fall Risk Fall Risk  03/08/2021 06/28/2020 06/01/2018 02/20/2018 11/05/2017  Falls in the past year? 0 0 0 0 No  Comment - - - [place] Telephone Survey: data to providers prior to load [employer] Telephone Survey: data to providers prior to load  Number falls in past yr: 0 - - - -  Injury with Fall? 0 - - - -  Risk for fall due to : No Fall Risks No Fall Risks - - -  Follow up Falls prevention discussed Falls evaluation completed Falls evaluation completed - -    FALL RISK PREVENTION PERTAINING TO THE HOME:  Any stairs in or around the home? Yes  If so, are there any without handrails? No  Home free of loose throw rugs in walkways, pet beds, electrical cords, etc? Yes  Adequate lighting in your home to reduce risk of falls? Yes   ASSISTIVE DEVICES UTILIZED TO PREVENT FALLS:  Life alert? No  Use of a cane, walker or w/c? No  Grab bars in the bathroom? Yes  Shower chair or bench in shower? Yes  Elevated toilet seat or a handicapped toilet? Yes   TIMED UP AND GO:  Was the test performed? No .    Cognitive Function: Normal cognitive status assessed by direct observation by this Nurse Health Advisor. No abnormalities found.          Immunizations Immunization History  Administered Date(s) Administered   [employer] Vaccination 05/01/2019, 05/31/2019, 02/29/2020   Pneumococcal Conjugate-13 06/01/2018   Pneumococcal Polysaccharide-23 06/29/2009, 02/10/2015   Zoster, Live 01/13/2011    TDAP status: Due, Education has been provided regarding the importance of this vaccine. Advised may receive this vaccine at local pharmacy or Health Dept. Aware to provide a copy of the vaccination record if obtained from local pharmacy or Health Dept. Verbalized acceptance and understanding.  Flu Vaccine status: Declined, Education has been provided regarding the  importance of this vaccine but patient still declined. Advised may receive this vaccine at local pharmacy  or Health Dept. Aware to provide a copy of the vaccination record if obtained from local pharmacy or Health Dept. Verbalized acceptance and understanding.  Pneumococcal vaccine status: Up to date  Covid-19 vaccine status: Completed vaccines  Qualifies for Shingles Vaccine? Yes   Zostavax completed Yes   Shingrix Completed?: No.    Education has been provided regarding the importance of this vaccine. Patient has been advised to call insurance company to determine out of pocket expense if they have not yet received this vaccine. Advised may also receive vaccine at local pharmacy or Health Dept. Verbalized acceptance and understanding.  Screening Tests Health Maintenance  Topic Date Due   Hepatitis C Screening  Never done   TETANUS/TDAP  Never done   Zoster Vaccines- Shingrix (1 of 2) Never done   COVID-19 Vaccine (4 - Booster for Moderna series) 04/25/2020   INFLUENZA VACCINE  Never done   Pneumonia Vaccine 38+ Years old  Completed   HPV VACCINES  Aged Out    Health Maintenance  Health Maintenance Due  Topic Date Due   Hepatitis C Screening  Never done   TETANUS/TDAP  Never done   Zoster Vaccines- Shingrix (1 of 2) Never done   COVID-19 Vaccine (4 - Booster for Moderna series) 04/25/2020   INFLUENZA VACCINE  Never done    Colorectal cancer screening: No longer required.   Lung Cancer Screening: (Low Dose CT Chest recommended if Age 8-80 years, 30 pack-year currently smoking OR have quit w/in 15years.) does not qualify.  Non smoker.  Additional Screening:  Hepatitis C Screening: does qualify; Completed DUE  Vision Screening: Recommended annual ophthalmology exams for early detection of glaucoma and other disorders of the eye. Is the patient up to date with their annual eye exam?  Yes  Who is the provider or what is the name of the office in which the patient attends  annual eye exams? [facility] If pt is not established with a provider, would they like to be referred to a provider to establish care? No .   Dental Screening: Recommended annual dental exams for proper oral hygiene  Community Resource Referral / Chronic Care Management: CRR required this visit?  No   CCM required this visit?  No      Plan:     I have personally reviewed and noted the following in the patient's chart:   Medical and social history Use of alcohol, tobacco or illicit drugs  Current medications and supplements including opioid prescriptions. Patient is currently taking opioid prescriptions. Information provided to patient regarding non-opioid alternatives. Patient advised to discuss non-opioid treatment plan with their provider. Functional ability and status Nutritional status Physical activity Advanced directives List of other physicians Hospitalizations, surgeries, and ER visits in previous 12 months Vitals Screenings to include cognitive, depression, and falls Referrals and appointments  In addition, I have reviewed and discussed with patient certain preventive protocols, quality metrics, and best practice recommendations. A written personalized care plan for preventive services as well as general preventive health recommendations were provided to patient.     Chriss Driver, LPN   28/10/8674   Nurse Notes: Phone visit. Pt at home. 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 Colonoscopy no longer required. Discussed flu and shingles vaccines and how to obtain. Up to date on eye exam.

## 2021-03-08 NOTE — Telephone Encounter (Signed)
Called pt for AWV. Requests refill on Tramadol 50mg . Pt asks for a 30 day or 90 day supply, if possible.

## 2021-04-26 ENCOUNTER — Other Ambulatory Visit: Payer: Self-pay | Admitting: Family Medicine

## 2021-04-26 DIAGNOSIS — I10 Essential (primary) hypertension: Secondary | ICD-10-CM

## 2021-04-30 ENCOUNTER — Other Ambulatory Visit: Payer: Self-pay | Admitting: Internal Medicine

## 2021-05-01 NOTE — Telephone Encounter (Signed)
Xarelto 20 mg refill request received. Pt is 76 years old, weight- 93 kg, Crea- 0.93 on 09/07/20, last seen by Dr. Lovena Le on 06/19/20, Diagnosis- PAF, CrCl- 87.5; Dose is appropriate based on dosing criteria. Will send in refill to requested pharmacy.

## 2021-06-06 DIAGNOSIS — R3915 Urgency of urination: Secondary | ICD-10-CM | POA: Diagnosis not present

## 2021-06-06 DIAGNOSIS — N200 Calculus of kidney: Secondary | ICD-10-CM | POA: Diagnosis not present

## 2021-06-06 DIAGNOSIS — R351 Nocturia: Secondary | ICD-10-CM | POA: Diagnosis not present

## 2021-06-06 DIAGNOSIS — N393 Stress incontinence (female) (male): Secondary | ICD-10-CM | POA: Diagnosis not present

## 2021-06-06 DIAGNOSIS — Z8546 Personal history of malignant neoplasm of prostate: Secondary | ICD-10-CM | POA: Diagnosis not present

## 2021-06-18 DIAGNOSIS — Z961 Presence of intraocular lens: Secondary | ICD-10-CM | POA: Diagnosis not present

## 2021-06-18 DIAGNOSIS — H40013 Open angle with borderline findings, low risk, bilateral: Secondary | ICD-10-CM | POA: Diagnosis not present

## 2021-06-18 DIAGNOSIS — H43813 Vitreous degeneration, bilateral: Secondary | ICD-10-CM | POA: Diagnosis not present

## 2021-06-18 DIAGNOSIS — H04123 Dry eye syndrome of bilateral lacrimal glands: Secondary | ICD-10-CM | POA: Diagnosis not present

## 2021-06-30 ENCOUNTER — Other Ambulatory Visit: Payer: Self-pay | Admitting: Family Medicine

## 2021-07-17 ENCOUNTER — Other Ambulatory Visit: Payer: Self-pay | Admitting: *Deleted

## 2021-07-17 ENCOUNTER — Other Ambulatory Visit: Payer: Self-pay | Admitting: Family Medicine

## 2021-07-17 MED ORDER — TRAMADOL HCL 50 MG PO TABS: ORAL_TABLET | ORAL | 0 refills | Status: DC

## 2021-07-17 NOTE — Telephone Encounter (Signed)
Patient needs a refill on his atorvastatin, and tramadol he is requesting 90 day refill on both He has scheduled an appt on 08/13/21. He uses [facility] on [address]he states that the last time his tramadol was called in the pharmacy wouldn't refill 4 a day he is not sure why they didn't. ? ?[phone] ?

## 2021-07-17 NOTE — Telephone Encounter (Signed)
Tramadol ? ?LOV 10/13/20 ?Last refill 03/08/21, #120, 0 refills ? ?Patient requesting 90 day refill on Tramadol. ? ?Please review, thanks! ? ?

## 2021-07-17 NOTE — Telephone Encounter (Signed)
Per chart Atorvastatin sent 07/02/21 for #90, he should not need a refill on this medication yet. ? ?Tramadol sent to Dr. Dennard Schaumann for review.  ? ?[place] to advise. ?

## 2021-07-17 NOTE — Telephone Encounter (Signed)
Pt last saw Dr Lovena Le 06/19/20, pt is overdue for follow-up.  Will send message to schedulers recall in [place]. Last labs 09/07/20 Creat 0.93, age 76, weight 93kg, Crcl 86.11, based on CrCl pt is on appropriate dosage of Xarelto '20mg'$  QD for afib.  Will await OV to be scheduled to refill rx.  ?

## 2021-07-18 MED ORDER — RIVAROXABAN 20 MG PO TABS: 20.0000 mg | ORAL_TABLET | Freq: Every day | ORAL | 0 refills | Status: DC

## 2021-07-18 NOTE — Telephone Encounter (Signed)
Pt made appt for 08/10/21 with Tommye Standard, PA.  Will refill rx to get pt to upcoming appt. ?

## 2021-08-08 NOTE — Progress Notes (Signed)
? ?Cardiology Office Note ?Date:  08/10/2021  ?Patient ID:  [name] [name], DOB Jan 05, 1946, MRN [id number] ?PCP:  Susy Frizzle, MD  ?Electrophysiologist: Dr. Lovena Le ? ?  ?Chief Complaint:  annual visit ? ?History of Present Illness: ?[name] [name] is a 76 y.o. male with history of stroke, HTN, HLD, AFib, Sinus node dysfunction with no symptoms ? ?He comes in today to be seen for Dr. Lovena Le, alst seen by him March 2022, reported 2 episodes of AFib in the year, longest a day in duration, remained busy/active making furniture.  Maintained on Flecainide ? ?TODAY ?He is doing well ?Continues to work at his [employer], started to slow down, only working about 40 hr/week! ?No exertional intolerances, no CP, SOB, DOE ?He does stretching exercises regularly and busy at work and in the yard. ?NO near syncope or syncope ?No unusual fatigue ?He thinks he had one episode of AFib since he saw Dr. Lovena Le, gets a sense of a little sluggish/fatigued checked his pulse was irregular. Was brief, and the only episode at least that he has felt ?He will have some hematuria with renal stones this is infrequent over the years, no bleeding otherwise. ? ?Sees his PMD next week for annual visit and labs ? ? ?Afib/AAD hx ?Diagnosed 2009 ?Flecainide started 2011 ? ? ?Past Medical History:  ?Diagnosis Date  ? CVA (cerebral infarction) 07-14-09  ? Dysrhythmia   ? History of kidney stones   ? Hyperlipidemia   ? Hypertension   ? Nephrolithiasis   ? Paroxysmal atrial fibrillation (HCC)   ? Paroxysmal atrial flutter ([place])   ? a. s/p ablation 2012.  ? Prostate cancer ([place])   ? Stroke [facility])   ? ? ?Past Surgical History:  ?Procedure Laterality Date  ? APPENDECTOMY    ? BACK SURGERY    ? CYSTOSCOPY/URETEROSCOPY/HOLMIUM LASER/STENT PLACEMENT Left 09/19/2020  ? Procedure: CYSTOSCOPY RETROGRADE PYELOGRAM URETEROSCOPY/HOLMIUM LASER/STENT PLACEMENT;  Surgeon: Irine Seal, MD;  Location: [facility];  Service: Urology;  Laterality: Left;  ? EYE SURGERY     ? PROSTATECTOMY  2005  ? ? ?Current Outpatient Medications  ?Medication Sig Dispense Refill  ? atorvastatin (LIPITOR) 20 MG tablet TAKE 1 TABLET(20 MG) BY MOUTH DAILY 90 tablet 0  ? flecainide (TAMBOCOR) 150 MG tablet TAKE 1 TABLET BY MOUTH TWICE DAILY 180 tablet 0  ? HYDROcodone-acetaminophen (NORCO) 5-325 MG tablet Take 1 tablet by mouth every 6 (six) hours as needed for moderate pain. 30 tablet 0  ? losartan (COZAAR) 50 MG tablet TAKE 1 TABLET(50 MG) BY MOUTH DAILY 90 tablet 2  ? rivaroxaban (XARELTO) 20 MG TABS tablet Take 1 tablet (20 mg total) by mouth daily with supper. OVERDUE for follow-up, MUST see provider for FUTURE refills. 90 tablet 0  ? traMADol (ULTRAM) 50 MG tablet TAKE 1 TABLET(50 MG) BY MOUTH EVERY 6 HOURS AS NEEDED 120 tablet 0  ? cyclobenzaprine (FLEXERIL) 10 MG tablet Take 1 tablet (10 mg total) by mouth 3 (three) times daily as needed for muscle spasms. (Patient not taking: Reported on 08/10/2021) 30 tablet 0  ? milk thistle 175 MG tablet Take 175 mg by mouth daily. (Patient not taking: Reported on 08/10/2021)    ? predniSONE (DELTASONE) 20 MG tablet 3 tabs poqday 1-2, 2 tabs poqday 3-4, 1 tab poqday 5-6 (Patient not taking: Reported on 03/08/2021) 12 tablet 0  ? ?No current facility-administered medications for this visit.  ? ? ?Allergies:   Patient has no known allergies.  ? ?Social  History:  The patient  reports that he has never smoked. He has never used smokeless tobacco. He reports that he does not drink alcohol and does not use drugs.  ? ?Family History:  The patient's family history includes Heart disease (age of onset: 8) in his mother; Prostate cancer in his father; Testicular cancer in his brother. ? ?ROS:  Please see the history of present illness.    ?All other systems are reviewed and otherwise negative.  ? ?PHYSICAL EXAM:  ?VS:  BP 110/66   Pulse 62   Ht '5\' 10"'$  (1.778 m)   Wt 212 lb 9.6 oz (96.4 kg)   SpO2 96%   BMI 30.50 kg/m?  BMI: Body mass index is 30.5 kg/m?. ?Well  nourished, well developed, in no acute distress ?HEENT: normocephalic, atraumatic ?Neck: no JVD, carotid bruits or masses ?Cardiac:  RRR; no significant murmurs, no rubs, or gallops ?Lungs:  CTA b/l, no wheezing, rhonchi or rales ?Abd: soft, nontender ?MS: no deformity or atrophy ?Ext: trace edema ?Skin: warm and dry, no rash ?Neuro:  No gross deficits appreciated ?Psych: euthymic mood, full affect ? ? ?EKG:  Done today and reviewed by myself shows  ?SR 62bpm, 1st degree AVblock 223m (last year 2625m, QRS 110 (unchanged), QTc [id number] last year) ? ? ?07/12/1999: TTE ?Study Conclusions  ? - Left ventricle: The cavity size was normal. Wall thickness was  ?   increased in a pattern of mild LVH. Systolic function was normal.  ?   The estimated ejection fraction was in the range of 55% to 60%.  ?   Wall motion was normal; there were no regional wall motion  ?   abnormalities.  ? - Mitral valve: Mild regurgitation.  ? - Left atrium: The atrium was moderately dilated.  ? - Right ventricle: The cavity size was mildly dilated.  ? - Right atrium: The atrium was mildly dilated.  ? - Pulmonary arteries: Systolic pressure was mildly increased ? ?Recent Labs: ?09/07/2020: BUN 18; Creatinine, Ser 0.93; Hemoglobin 15.8; Platelets 207; Potassium 4.1; Sodium 141  ?No results found for requested labs within last 8760 hours.  ? ?CrCl cannot be calculated (Patient's most recent lab result is older than the maximum 21 days allowed.).  ? ?Wt Readings from Last 3 Encounters:  ?08/10/21 212 lb 9.6 oz (96.4 kg)  ?03/08/21 205 lb (93 kg)  ?10/13/20 212 lb (96.2 kg)  ?  ? ?Other studies reviewed: ?Additional studies/records reviewed today include: summarized above ? ?ASSESSMENT AND PLAN: ? ?Paroxysmal AFib ?CHA2DS2Vasc is 5, on Xarelto, appropriately dosed ?flecainide with stable intervals ?minimal burden by symptoms ? ?He is not on a nodal blocker, has not been historically ?He has a diagnosis/known sinus node dysfunction, and likely 2/2  this. ?I will reach out to Dr. TayLovena Lehough do not anticipate any recommended changes ? ? ?HTN ?Looks good ? ?Sinus node dysfunction ?No symptoms of bradycardia ? ? ?Disposition: discussed 26mo69molow up with flecainide/xarelto, though he prefers to stay on an annual schedule ? ?Current medicines are reviewed at length with the patient today.  The patient did not have any concerns regarding medicines. ? ?Signed, ?ReneTommye Standard-C ?08/10/2021 11:32 AM    ? ?[facility] ?[id number] ?[id number]) [phone] (office)  ?[phone]) ? ? ?

## 2021-08-10 ENCOUNTER — Encounter: Payer: Self-pay | Admitting: Physician Assistant

## 2021-08-10 ENCOUNTER — Ambulatory Visit (INDEPENDENT_AMBULATORY_CARE_PROVIDER_SITE_OTHER): Payer: Medicare Other | Admitting: Physician Assistant

## 2021-08-10 VITALS — BP 110/66 | HR 62 | Ht 70.0 in | Wt 212.6 lb

## 2021-08-10 DIAGNOSIS — Z79899 Other long term (current) drug therapy: Secondary | ICD-10-CM | POA: Diagnosis not present

## 2021-08-10 DIAGNOSIS — I495 Sick sinus syndrome: Secondary | ICD-10-CM | POA: Diagnosis not present

## 2021-08-10 DIAGNOSIS — Z5181 Encounter for therapeutic drug level monitoring: Secondary | ICD-10-CM

## 2021-08-10 DIAGNOSIS — I480 Paroxysmal atrial fibrillation: Secondary | ICD-10-CM

## 2021-08-10 NOTE — Patient Instructions (Signed)
Medication Instructions:  ? ?Your physician recommends that you continue on your current medications as directed. Please refer to the Current Medication list given to you today. ? ? ?*If you need a refill on your cardiac medications before your next appointment, please call your pharmacy* ? ? ?Lab Work: [place] ? ? ?If you have labs (blood work) drawn today and your tests are completely normal, you will receive your results only by: ?MyChart Message (if you have MyChart) OR ?A paper copy in the mail ?If you have any lab test that is abnormal or we need to change your treatment, we will call you to review the results. ? ? ?Testing/Procedures: NONE ORDERED  TODAY ? ? ? ? ?Follow-Up: ?At [facility], you and your health needs are our priority.  As part of our continuing mission to provide you with exceptional heart care, we have created designated Provider Care Teams.  These Care Teams include your primary Cardiologist (physician) and Advanced Practice Providers (APPs -  Physician Assistants and Nurse Practitioners) who all work together to provide you with the care you need, when you need it. ? ?We recommend signing up for the patient portal called "MyChart".  Sign up information is provided on this After Visit Summary.  MyChart is used to connect with patients for Virtual Visits (Telemedicine).  Patients are able to view lab/test results, encounter notes, upcoming appointments, etc.  Non-urgent messages can be sent to your provider as well.   ?To learn more about what you can do with MyChart, go to [email or web address].   ? ?Your next appointment:   ?1 year(s) ? ?The format for your next appointment:   ?In Person ? ?Provider:   ?You may see Dr. Lovena Le  or one of the following Advanced Practice Providers on your designated Care Team:   ?Tommye Standard, PA-C ? ?1}  ?Other Instructions ? ? ?Important Information About Sugar ? ? ? ? ?  ?

## 2021-08-13 ENCOUNTER — Ambulatory Visit (INDEPENDENT_AMBULATORY_CARE_PROVIDER_SITE_OTHER): Payer: Medicare Other | Admitting: Family Medicine

## 2021-08-13 VITALS — BP 118/82 | HR 55 | Temp 97.7°F | Ht 70.0 in | Wt 212.6 lb

## 2021-08-13 DIAGNOSIS — Z8673 Personal history of transient ischemic attack (TIA), and cerebral infarction without residual deficits: Secondary | ICD-10-CM | POA: Diagnosis not present

## 2021-08-13 DIAGNOSIS — I10 Essential (primary) hypertension: Secondary | ICD-10-CM | POA: Diagnosis not present

## 2021-08-13 DIAGNOSIS — E7800 Pure hypercholesterolemia, unspecified: Secondary | ICD-10-CM

## 2021-08-13 DIAGNOSIS — I4891 Unspecified atrial fibrillation: Secondary | ICD-10-CM | POA: Diagnosis not present

## 2021-08-13 DIAGNOSIS — Z8546 Personal history of malignant neoplasm of prostate: Secondary | ICD-10-CM | POA: Diagnosis not present

## 2021-08-13 DIAGNOSIS — R2689 Other abnormalities of gait and mobility: Secondary | ICD-10-CM | POA: Diagnosis not present

## 2021-08-13 MED ORDER — TRAMADOL HCL 50 MG PO TABS: ORAL_TABLET | ORAL | 0 refills | Status: DC

## 2021-08-13 NOTE — Progress Notes (Signed)
? ?Subjective:  ? ? Patient ID: [name] [name], male    DOB: 11-Sep-1945, 76 y.o.   MRN: [id number] ? ?HPI ?Patient is a 76 year old Caucasian gentleman here today for a checkup.  He has a history of paroxysmal atrial fibrillation currently rhythm controlled with flecainide.  He is appropriately anticoagulated with Xarelto.  He denies any chest pain shortness of breath or dyspnea on exertion.  He has hypertension but his blood pressure is well controlled with losartan 118/82.  He also takes atorvastatin for hyperlipidemia but he denies any myalgias or right upper quadrant pain.  He is having pain over the posterior aspect of his right calcaneus near the insertion of the Achilles tendon.  There is no palpable deformity in that area.  There is no swelling or erythema.  I suspect Achilles tendinitis.  He also has a history of prostate cancer and is due to recheck a PSA ? ?Past Medical History:  ?Diagnosis Date  ? CVA (cerebral infarction) 07-14-09  ? Dysrhythmia   ? History of kidney stones   ? Hyperlipidemia   ? Hypertension   ? Nephrolithiasis   ? Paroxysmal atrial fibrillation (HCC)   ? Paroxysmal atrial flutter ([place])   ? a. s/p ablation 2012.  ? Prostate cancer ([place])   ? Stroke [facility])   ? ?Past Surgical History:  ?Procedure Laterality Date  ? APPENDECTOMY    ? BACK SURGERY    ? CYSTOSCOPY/URETEROSCOPY/HOLMIUM LASER/STENT PLACEMENT Left 09/19/2020  ? Procedure: CYSTOSCOPY RETROGRADE PYELOGRAM URETEROSCOPY/HOLMIUM LASER/STENT PLACEMENT;  Surgeon: Irine Seal, MD;  Location: [facility];  Service: Urology;  Laterality: Left;  ? EYE SURGERY    ? PROSTATECTOMY  2005  ? ?Current Outpatient Medications on File Prior to Visit  ?Medication Sig Dispense Refill  ? atorvastatin (LIPITOR) 20 MG tablet TAKE 1 TABLET(20 MG) BY MOUTH DAILY 90 tablet 0  ? flecainide (TAMBOCOR) 150 MG tablet TAKE 1 TABLET BY MOUTH TWICE DAILY 180 tablet 0  ? losartan (COZAAR) 50 MG tablet TAKE 1 TABLET(50 MG) BY MOUTH DAILY 90 tablet 2  ? rivaroxaban (XARELTO)  20 MG TABS tablet Take 1 tablet (20 mg total) by mouth daily with supper. OVERDUE for follow-up, MUST see provider for FUTURE refills. 90 tablet 0  ? traMADol (ULTRAM) 50 MG tablet TAKE 1 TABLET(50 MG) BY MOUTH EVERY 6 HOURS AS NEEDED 120 tablet 0  ? ?No current facility-administered medications on file prior to visit.  ? ? ? ?No Known Allergies ?Social History  ? ?Socioeconomic History  ? Marital status: Married  ?  Spouse name: Not on file  ? Number of children: 2  ? Years of education: Not on file  ? Highest education level: Not on file  ?Occupational History  ? Occupation: Clinical research associate  ?  Employer: [employer]  ?Tobacco Use  ? Smoking status: Never  ? Smokeless tobacco: Never  ?Vaping Use  ? Vaping Use: Never used  ?Substance and Sexual Activity  ? Alcohol use: No  ? Drug use: No  ? Sexual activity: Not on file  ?Other Topics Concern  ? Not on file  ?Social History Narrative  ? Not on file  ? ?Social Determinants of Health  ? ?Financial Resource Strain: Low Risk   ? Difficulty of Paying Living Expenses: Not hard at all  ?Food Insecurity: No Food Insecurity  ? Worried About Charity fundraiser in the Last Year: Never true  ? Ran Out of Food in the Last Year: Never true  ?Transportation  Needs: No Transportation Needs  ? Lack of Transportation (Medical): No  ? Lack of Transportation (Non-Medical): No  ?Physical Activity: Insufficiently Active  ? Days of Exercise per Week: 3 days  ? Minutes of Exercise per Session: 20 min  ?Stress: No Stress Concern Present  ? Feeling of Stress : Not at all  ?Social Connections: Socially Integrated  ? Frequency of Communication with Friends and Family: More than three times a week  ? Frequency of Social Gatherings with Friends and Family: More than three times a week  ? Attends Religious Services: More than 4 times per year  ? Active Member of Clubs or Organizations: Yes  ? Attends Archivist Meetings: More than 4 times per year  ? Marital Status: Married   ?Intimate Partner Violence: Not At Risk  ? Fear of Current or Ex-Partner: No  ? Emotionally Abused: No  ? Physically Abused: No  ? Sexually Abused: No  ? ? ?Review of Systems ? ?   ?Objective:  ? Physical Exam ?Vitals reviewed.  ?Constitutional:   ?   General: He is not in acute distress. ?   Appearance: Normal appearance. He is not ill-appearing or toxic-appearing.  ?Eyes:  ?   General: Lids are normal. No visual field deficit.    ?   Right eye: No discharge.     ?   Left eye: No discharge.  ?   Extraocular Movements: Extraocular movements intact.  ?   Right eye: Normal extraocular motion and no nystagmus.  ?   Left eye: Normal extraocular motion and no nystagmus.  ?   Conjunctiva/sclera: Conjunctivae normal.  ?   Right eye: Right conjunctiva is not injected. No chemosis or exudate. ?   Left eye: Left conjunctiva is not injected. No chemosis or exudate. ?   Pupils: Pupils are equal, round, and reactive to light.  ?Cardiovascular:  ?   Rate and Rhythm: Normal rate and regular rhythm.  ?   Pulses:     ?     Dorsalis pedis pulses are 2+ on the right side.  ?     Posterior tibial pulses are 2+ on the right side.  ?   Heart sounds: No murmur heard. ?  No gallop.  ?Pulmonary:  ?   Effort: Pulmonary effort is normal. No respiratory distress.  ?   Breath sounds: Normal breath sounds. No wheezing or rales.  ?Musculoskeletal:  ?   Right foot: Normal range of motion. No deformity or bunion.  ?Feet:  ?   Right foot:  ?   Skin integrity: Skin integrity normal. No ulcer, blister, skin breakdown, erythema, warmth or callus.  ?Neurological:  ?   General: No focal deficit present.  ?   Mental Status: He is alert and oriented to person, place, and time.  ?   Cranial Nerves: No cranial nerve deficit.  ?   Sensory: No sensory deficit.  ?   Motor: No weakness.  ?   Coordination: Coordination normal.  ?   Gait: Gait normal.  ? ?   ?Assessment & Plan:  ?Atrial fibrillation, unspecified type ([place]) - Plan: CBC with Differential/Platelet,  Lipid panel, COMPLETE METABOLIC PANEL WITH GFR ? ?History of TIA (transient ischemic attack) - Plan: CBC with Differential/Platelet, Lipid panel, COMPLETE METABOLIC PANEL WITH GFR ? ?Benign essential HTN - Plan: CBC with Differential/Platelet, Lipid panel, COMPLETE METABOLIC PANEL WITH GFR ? ?Pure hypercholesterolemia - Plan: CBC with Differential/Platelet, Lipid panel, COMPLETE METABOLIC PANEL WITH GFR ? ?History of  prostate cancer - Plan: PSA ?I will check a CBC CMP and a lipid panel.  Ideally I like his LDL cholesterol to be below 100.  His blood pressure is well controlled.  His atrial fibrillation is appropriately managed with flecainide and Xarelto.  Overall he is doing well.  I believe he has Achilles tendinitis in his right heel.  We discussed wearing heel cups and Voltaren gel as an anti-inflammatory.  Reassess in 2 to 3 weeks if no better.  Patient request a referral to physical therapy due to decreasing balance and low back pain and decreasing range of motion.  He would like to try physical therapy to improve his balance. ?

## 2021-08-14 LAB — CBC WITH DIFFERENTIAL/PLATELET
Absolute Monocytes: 625 cells/uL (ref 200–950)
Basophils Absolute: 50 cells/uL (ref 0–200)
Basophils Relative: 1 %
Eosinophils Absolute: 130 cells/uL (ref 15–500)
Eosinophils Relative: 2.6 %
HCT: 46.9 % (ref 38.5–50.0)
Hemoglobin: 15.7 g/dL (ref 13.2–17.1)
Lymphs Abs: 1230 cells/uL (ref 850–3900)
MCH: 30.8 pg (ref 27.0–33.0)
MCHC: 33.5 g/dL (ref 32.0–36.0)
MCV: 92 fL (ref 80.0–100.0)
MPV: 10.4 fL (ref 7.5–12.5)
Monocytes Relative: 12.5 %
Neutro Abs: 2965 cells/uL (ref 1500–7800)
Neutrophils Relative %: 59.3 %
Platelets: 227 10*3/uL (ref 140–400)
RBC: 5.1 10*6/uL (ref 4.20–5.80)
RDW: 12.3 % (ref 11.0–15.0)
Total Lymphocyte: 24.6 %
WBC: 5 10*3/uL (ref 3.8–10.8)

## 2021-08-14 LAB — COMPLETE METABOLIC PANEL WITH GFR
AG Ratio: 1.6 (calc) (ref 1.0–2.5)
ALT: 21 U/L (ref 9–46)
AST: 17 U/L (ref 10–35)
Albumin: 4 g/dL (ref 3.6–5.1)
Alkaline phosphatase (APISO): 65 U/L (ref 35–144)
BUN: 13 mg/dL (ref 7–25)
CO2: 26 mmol/L (ref 20–32)
Calcium: 8.9 mg/dL (ref 8.6–10.3)
Chloride: 107 mmol/L (ref 98–110)
Creat: 1.06 mg/dL (ref 0.70–1.28)
Globulin: 2.5 g/dL (calc) (ref 1.9–3.7)
Glucose, Bld: 103 mg/dL — ABNORMAL HIGH (ref 65–99)
Potassium: 4.4 mmol/L (ref 3.5–5.3)
Sodium: 142 mmol/L (ref 135–146)
Total Bilirubin: 0.6 mg/dL (ref 0.2–1.2)
Total Protein: 6.5 g/dL (ref 6.1–8.1)
eGFR: 72 mL/min/{1.73_m2} (ref >=60)

## 2021-08-14 LAB — LIPID PANEL
Cholesterol: 107 mg/dL (ref <200)
HDL: 30 mg/dL — ABNORMAL LOW (ref >=40)
LDL Cholesterol (Calc): 64 mg/dL (calc)
Non-HDL Cholesterol (Calc): 77 mg/dL (calc) (ref <130)
Total CHOL/HDL Ratio: 3.6 (calc) (ref <5.0)
Triglycerides: 54 mg/dL (ref <150)

## 2021-08-14 LAB — PSA: PSA: <0.04 ng/mL (ref <=4.00)

## 2021-08-29 ENCOUNTER — Ambulatory Visit: Payer: Medicare Other | Attending: Family Medicine

## 2021-08-29 DIAGNOSIS — R2681 Unsteadiness on feet: Secondary | ICD-10-CM | POA: Diagnosis not present

## 2021-08-29 DIAGNOSIS — M25571 Pain in right ankle and joints of right foot: Secondary | ICD-10-CM

## 2021-08-29 DIAGNOSIS — M6281 Muscle weakness (generalized): Secondary | ICD-10-CM

## 2021-08-29 DIAGNOSIS — R2689 Other abnormalities of gait and mobility: Secondary | ICD-10-CM | POA: Diagnosis not present

## 2021-08-29 NOTE — Therapy (Signed)
OUTPATIENT PHYSICAL THERAPY LOWER EXTREMITY EVALUATION   Patient Name: [name] [name] MRN: [id number] DOB:1945/09/24, 76 y.o., male Today's Date: 08/29/2021   PT End of Session - 08/29/21 1359     Visit Number 1    Number of Visits 9    Date for PT Re-Evaluation 10/24/21    Authorization Type Medicare    PT Start Time 1217    PT Stop Time 1300    PT Time Calculation (min) 43 min    Activity Tolerance Patient tolerated treatment well    Behavior During Therapy [facility] for tasks assessed/performed             Past Medical History:  Diagnosis Date   CVA (cerebral infarction) 07-14-09   Dysrhythmia    History of kidney stones    Hyperlipidemia    Hypertension    Nephrolithiasis    Paroxysmal atrial fibrillation (HCC)    Paroxysmal atrial flutter ([place])    a. s/p ablation 2012.   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   Body mass index (BMI) 29.0-29.9, adult 12/15/2020   Sinus node dysfunction ([place]) 06/19/2020   Hypertension 06/19/2020   Glaucoma 04/17/2016   DYSLIPIDEMIA 08/03/2009   ATRIAL FIBRILLATION 08/03/2009   Transient cerebral ischemia 08/03/2009   ATRIAL FLUTTER 06/17/2008    PCP: Susy Frizzle, MD  REFERRING PROVIDER: Susy Frizzle, MD  REFERRING DIAG: R26.89 (ICD-10-CM) - Poor balance  THERAPY DIAG:  Muscle weakness (generalized)  Unsteadiness on feet  Pain in right ankle and joints of right foot  Other abnormalities of gait and mobility  Rationale for Evaluation and Treatment  Rehabilitation  ONSET DATE: Chronic  SUBJECTIVE:   SUBJECTIVE STATEMENT: Pt presents to PT with reports of R heel/ankle pain as well as general imbalance and unsteadiness. Notes he has trouble with bending and lifting something from the floor as well. Denies any paresthesias in LE, does have some unsteadiness towards R though he denies falls.   PERTINENT HISTORY: Afib, CVA, HTN, Hx of cervical and lumbar fusion  PAIN:  Are you having pain?  Yes: NPRS scale: 1/10 (6/10) Pain location: R heel Pain description: sharp Aggravating factors: prolonged standing Relieving factors: rest, shoes, voltarin  PRECAUTIONS: None  WEIGHT BEARING RESTRICTIONS No  FALLS:  Has patient fallen in last 6 months? 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  PLOF: Independent and Independent with basic ADLs  PATIENT GOALS: decrease pain, improve functional ability with lifting, improve balance   OBJECTIVE:   DIAGNOSTIC FINDINGS:   N/A  PATIENT SURVEYS:  FOTO 51% function; 58% predicted  COGNITION:  Overall cognitive status: Within functional limits for tasks assessed     SENSATION: WFL  MUSCLE LENGTH: Hamstrings: Right 40 deg; Left 40 deg  POSTURE: rounded shoulders, forward head, and slumped sitting posture  PALPATION: TTP to R distal achilles insertion   LOWER EXTREMITY MMT:  AROM  Right 08/29/2021 Left 08/29/2021  Hip flexion  5/5  5/5  Hip extension    Hip abduction 5/5 5/5  Hip adduction 5/5 5/5  Hip internal rotation    Hip external rotation    Knee flexion    Knee extension    Ankle dorsiflexion 5/5 5/5  Ankle plantarflexion 5/5 5/5  Ankle inversion 4/5 5/5  Ankle eversion 5/5 5/5   (Blank rows = not tested)  LOWER EXTREMITY ROM:  MMT Right 08/29/2021 Left 08/29/2021  Ankle dorsiflexion  3 8  Ankle  plantarflexion    Ankle inversion    Ankle eversion    (Blank rows = not tested)   LOWER EXTREMITY SPECIAL TESTS:  N/A  FUNCTIONAL TESTS:  30 Second Sit to Stand: 10 reps SLS: 15" R 20" L  GAIT: Distance walked: 25f Assistive device utilized: None Level of assistance: Complete Independence Comments: antalgic gait R  TODAY'S TREATMENT: [facility] Adult PT Treatment:                                                DATE: 08/29/2021 Therapeutic Exercise: Seated calf stretch R x 30" Seated hamstring stretch x 30" R Ankle inversion RTB x 10 R Mini squat with UE support x 10 SLS x 15" R  PATIENT EDUCATION:  Education details: eval findings, FOTO, HEP, POC Person educated: Patient Education method: Explanation, Demonstration, and Handouts Education comprehension: verbalized understanding and returned demonstration   HOME EXERCISE PROGRAM: Access Code: [email or web address]: [email or web address] Date: 08/29/2021 Prepared by: DOctavio Manns Exercises - Seated Calf Stretch with Strap  - 1-2 x daily - 7 x weekly - 2-3 reps - 30 sec hold - Seated Hamstring Stretch  - 1-2 x daily - 7 x weekly - 2-3 reps - 30 seconds hold - Ankle Inversion with Resistance  - 1-2 x daily - 7 x weekly - 3 sets - 10 reps - Mini Squat with Counter Support  - 1-2 x daily - 7 x weekly - 2-3 sets - 10 reps - Single Leg Stance  - 1-2 x daily - 7 x weekly - 2-3 reps - 30 sec hold  ASSESSMENT:  CLINICAL IMPRESSION: Patient is a 76y.o. M who was seen today for physical therapy evaluation and treatment for R ankle pain and imbalance. Physical findings are consistent with MD impresson, as pt demonstrates muscle tightness and decreased stability in R ankle as well as general decrease in functional mobility. He would benefit from skilled PT services working on improving strength and balance in order to decrease pain and improve function.    OBJECTIVE IMPAIRMENTS decreased activity tolerance, decreased balance,  decreased mobility, difficulty walking, decreased ROM, decreased strength, and pain.   ACTIVITY LIMITATIONS carrying, lifting, bending, and squatting  PARTICIPATION LIMITATIONS: occupation and yard work  PERSONAL FACTORS Time since onset of injury/illness/exacerbation and 3+ comorbidities: Afib, CVA, HTN, Hx of cervical and lumbar fusion  are also affecting patient's functional outcome.   REHAB POTENTIAL: Excellent  CLINICAL DECISION MAKING: Stable/uncomplicated  EVALUATION COMPLEXITY: Low   GOALS: Goals reviewed with patient? No  SHORT TERM GOALS: Target date: 09/19/2021   Pt will be compliant and knowledgeable with initial HEP for improved comfort and carryover Baseline: initial HEP given  Goal status: INITIAL  2.  Pt will self report right ankle pain no greater than 3/10 for improved comfort and functional  ability Baseline: 6/10 at worst Goal status: INITIAL  LONG TERM GOALS: Target date: 10/24/2021   Pt will improve FOTO function score to no less than 58% as proxy for functional improvement Baseline: 51% function Goal status: INITIAL  2.  Pt will increase 30 Second Sit to Stand rep count to no less than 12 reps for improved balance, strength, and functional mobility Baseline: 10 reps  Goal status: INITIAL  3.  Pt will self report right ankle pain no greater than 3/10 for improved comfort and functional ability Baseline: 6/10 at worst Goal status: INITIAL  4.  Pt will increase bilateral SLS time to no less than 30 seconds each for improved balance and mobility Baseline: SLS: 15" R 20" L Goal status: INITIAL  5.  Pt will be able to squat with hands to floor for improved ability to lift objects for occupation and yard work Baseline: unable Goal status: INITIAL  PLAN: PT FREQUENCY: 1-2x/week  PT DURATION: 8 weeks  PLANNED INTERVENTIONS: Therapeutic exercises, Therapeutic activity, Neuromuscular re-education, Balance training, Gait training, Patient/Family  education, Joint mobilization, Dry Needling, Electrical stimulation, Cryotherapy, Moist heat, Vasopneumatic device, Manual therapy, and Re-evaluation  PLAN FOR NEXT SESSION: assess HEP response, progress strength and balance as able   Rabbani Chatters, PT 08/29/2021, 2:24 PM

## 2021-08-29 NOTE — Addendum Note (Signed)
Addended by: Hentz Chatters on: 08/29/2021 02:25 PM   Modules accepted: Orders

## 2021-09-05 DIAGNOSIS — N393 Stress incontinence (female) (male): Secondary | ICD-10-CM | POA: Diagnosis not present

## 2021-09-05 DIAGNOSIS — N200 Calculus of kidney: Secondary | ICD-10-CM | POA: Diagnosis not present

## 2021-09-05 DIAGNOSIS — R310 Gross hematuria: Secondary | ICD-10-CM | POA: Diagnosis not present

## 2021-09-06 ENCOUNTER — Ambulatory Visit: Payer: Medicare Other | Attending: Family Medicine

## 2021-09-06 DIAGNOSIS — M6281 Muscle weakness (generalized): Secondary | ICD-10-CM | POA: Insufficient documentation

## 2021-09-06 DIAGNOSIS — M25571 Pain in right ankle and joints of right foot: Secondary | ICD-10-CM | POA: Diagnosis not present

## 2021-09-06 DIAGNOSIS — R2689 Other abnormalities of gait and mobility: Secondary | ICD-10-CM | POA: Diagnosis not present

## 2021-09-06 DIAGNOSIS — R2681 Unsteadiness on feet: Secondary | ICD-10-CM | POA: Diagnosis not present

## 2021-09-06 NOTE — Therapy (Signed)
OUTPATIENT PHYSICAL THERAPY TREATMENT NOTE   Patient Name: [name] [name] MRN: [id number] DOB:1946-02-25, 76 y.o., male Today's Date: 09/06/2021  PCP: Susy Frizzle, MD   REFERRING PROVIDER: Susy Frizzle, MD  END OF SESSION:   PT End of Session - 09/06/21 1823     Visit Number 2    Number of Visits 9    Date for PT Re-Evaluation 10/24/21    Authorization Type Medicare    PT Start Time 1825    PT Stop Time 1904    PT Time Calculation (min) 39 min    Activity Tolerance Patient tolerated treatment well    Behavior During Therapy [facility] for tasks assessed/performed             Past Medical History:  Diagnosis Date   CVA (cerebral infarction) 07-14-09   Dysrhythmia    History of kidney stones    Hyperlipidemia    Hypertension    Nephrolithiasis    Paroxysmal atrial fibrillation ([place])    Paroxysmal atrial flutter ([place])    a. s/p ablation 2012.   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   Body mass index (BMI) 29.0-29.9, adult 12/15/2020   Sinus node dysfunction ([place]) 06/19/2020   Hypertension 06/19/2020   Glaucoma 04/17/2016   DYSLIPIDEMIA 08/03/2009   ATRIAL FIBRILLATION 08/03/2009   Transient cerebral ischemia 08/03/2009   ATRIAL FLUTTER 06/17/2008    REFERRING DIAG: R26.89 (ICD-10-CM) - Poor balance  THERAPY DIAG:  Unsteadiness on feet  Muscle weakness (generalized)  Pain in right ankle and joints of right foot  Other abnormalities of gait and mobility  Rationale for Evaluation and Treatment  Rehabilitation  PERTINENT HISTORY: Afib, CVA, HTN, Hx of cervical and lumbar fusion  PRECAUTIONS: None  SUBJECTIVE:  Pt presents to PT with reports of continued R ankle pain and discomfort. Has been compliant with HEP with no adverse effect. Pt is ready to begin PT at this time.   PAIN:  Are you having pain?  Yes: NPRS scale: 1/10 (6/10) Pain location: R heel Pain description: sharp Aggravating factors: prolonged standing Relieving factors: rest, shoes, voltarin   OBJECTIVE: (objective measures completed at initial evaluation unless otherwise dated)  DIAGNOSTIC FINDINGS:            N/A   PATIENT SURVEYS:  FOTO 51% function; 58% predicted   COGNITION:           Overall cognitive status: Within functional limits for tasks assessed                          SENSATION: WFL   MUSCLE LENGTH: Hamstrings: Right 40 deg; Left 40 deg   POSTURE: rounded shoulders, forward head, and slumped sitting posture   PALPATION: TTP to R distal achilles insertion    LOWER EXTREMITY MMT:   AROM Right 08/29/2021 Left 08/29/2021  Hip flexion  5/5  5/5  Hip extension      Hip abduction 5/5 5/5  Hip adduction 5/5 5/5  Hip internal  rotation      Hip external rotation      Knee flexion      Knee extension      Ankle dorsiflexion 5/5 5/5  Ankle plantarflexion 5/5 5/5  Ankle inversion 4/5 5/5  Ankle eversion 5/5 5/5   (Blank rows = not tested)   LOWER EXTREMITY ROM:   MMT Right 08/29/2021 Left 08/29/2021  Ankle dorsiflexion  3 8  Ankle plantarflexion      Ankle inversion      Ankle eversion      (Blank rows = not tested)     LOWER EXTREMITY SPECIAL TESTS:  N/A   FUNCTIONAL TESTS:  30 Second Sit to Stand: 10 reps SLS: 15" R 20" L   GAIT: Distance walked: 4f Assistive device utilized: None Level of assistance: Complete Independence Comments: antalgic gait R   TODAY'S TREATMENT: [facility] Adult PT Treatment:                                                DATE:  09/06/2021 Therapeutic Exercise: NuStep lvl 5 UE/LE x 3 min while taking subjective Slant board calf stretch 2x30"  Standing soleus stretch x 20" R Heel raise 2x15 Mini squat with UE support 2x10  SLS x 30" each Tandem on foam 2x30" each Wobble board 2x10 fwd/bwd KB deadlift 3x10 15# 8in  Seated calf stretch R x 30" Ankle inversion RTB x 10 R  [facility] Adult PT Treatment:                                                DATE: 08/29/2021 Therapeutic Exercise: Seated calf stretch R x 30" Seated hamstring stretch x 30" R Ankle inversion RTB x 10 R Mini squat with UE support x 10 SLS x 15" R   PATIENT EDUCATION:  Education details: eval findings, FOTO, HEP, POC Person educated: Patient Education method: Explanation, Demonstration, and Handouts Education comprehension: verbalized understanding and returned demonstration     HOME EXERCISE PROGRAM: Access Code: [email or web address]: [email or web address] Date: 08/29/2021 Prepared by: DOctavio Manns  Exercises - Seated Calf Stretch with Strap  - 1-2 x daily - 7 x weekly - 2-3 reps - 30 sec hold - Seated Hamstring Stretch  - 1-2 x daily - 7 x weekly - 2-3 reps - 30 seconds hold - Ankle Inversion with Resistance  - 1-2 x daily - 7 x weekly - 3 sets - 10 reps - Mini Squat with Counter Support  - 1-2 x daily - 7 x weekly - 2-3 sets - 10 reps - Single Leg Stance  - 1-2 x daily - 7 x weekly - 2-3 reps - 30 sec hold   ASSESSMENT:   CLINICAL IMPRESSION: Pt was able to complete all prescribed exercises with no adverse effect or increase in pain. Therapy focused on improving distal LE strength and stability while also incorporating functional motions such as dead-lifting. Pt continues to benefit from skilled PT services and will continue to be seen and progress as able.      OBJECTIVE IMPAIRMENTS decreased activity tolerance, decreased balance, decreased mobility, difficulty walking, decreased ROM, decreased strength, and pain.     ACTIVITY LIMITATIONS carrying, lifting, bending, and squatting   PARTICIPATION LIMITATIONS:  occupation and yard work   PERSONAL FACTORS Time since onset of injury/illness/exacerbation and 3+ comorbidities: Afib, CVA, HTN, Hx of cervical and lumbar fusion  are also affecting patient's functional outcome.    GOALS: Goals reviewed with patient? No   SHORT TERM GOALS: Target date: 09/19/2021    Pt will be compliant and knowledgeable with initial HEP for improved comfort and carryover Baseline: initial HEP given  Goal status: INITIAL   2.  Pt will self report right ankle pain no greater than 3/10 for improved comfort and functional ability Baseline: 6/10 at worst Goal status: INITIAL   LONG TERM GOALS: Target date: 10/24/2021    Pt will improve FOTO function score to no less than 58% as proxy for functional improvement Baseline: 51% function Goal status: INITIAL   2.  Pt will increase 30 Second Sit to Stand rep count to no less than 12 reps for improved balance, strength, and functional mobility Baseline: 10 reps  Goal status: INITIAL   3.  Pt will self report right ankle pain no greater than 3/10 for improved comfort and functional ability Baseline: 6/10 at worst Goal status: INITIAL   4.  Pt will increase bilateral SLS time to no less than 30 seconds each for improved balance and mobility Baseline: SLS: 15" R 20" L Goal status: INITIAL   5.  Pt will be able to squat with hands to floor for improved ability to lift objects for occupation and yard work Baseline: unable Goal status: INITIAL   PLAN: PT FREQUENCY: 1-2x/week   PT DURATION: 8 weeks   PLANNED INTERVENTIONS: Therapeutic exercises, Therapeutic activity, Neuromuscular re-education, Balance training, Gait training, Patient/Family education, Joint mobilization, Dry Needling, Electrical stimulation, Cryotherapy, Moist heat, Vasopneumatic device, Manual therapy, and Re-evaluation   PLAN FOR NEXT SESSION: assess HEP  response, progress strength and balance as able    Dunkerson Chatters, PT 09/06/2021, 7:09 PM

## 2021-09-10 ENCOUNTER — Ambulatory Visit: Payer: Medicare Other

## 2021-09-10 DIAGNOSIS — M6281 Muscle weakness (generalized): Secondary | ICD-10-CM

## 2021-09-10 DIAGNOSIS — M25571 Pain in right ankle and joints of right foot: Secondary | ICD-10-CM

## 2021-09-10 DIAGNOSIS — R2681 Unsteadiness on feet: Secondary | ICD-10-CM

## 2021-09-10 DIAGNOSIS — R2689 Other abnormalities of gait and mobility: Secondary | ICD-10-CM

## 2021-09-10 NOTE — Therapy (Signed)
OUTPATIENT PHYSICAL THERAPY TREATMENT NOTE   Patient Name: [name] [name] MRN: [id number] DOB:May 21, 1945, 76 y.o., male Today's Date: 09/10/2021  PCP: Susy Frizzle, MD   REFERRING PROVIDER: Susy Frizzle, MD  END OF SESSION:   PT End of Session - 09/10/21 1214     Visit Number 3    Number of Visits 9    Date for PT Re-Evaluation 10/24/21    Authorization Type Medicare    PT Start Time 1215    PT Stop Time 1253    PT Time Calculation (min) 38 min    Activity Tolerance Patient tolerated treatment well    Behavior During Therapy [facility] for tasks assessed/performed              Past Medical History:  Diagnosis Date   CVA (cerebral infarction) 07-14-09   Dysrhythmia    History of kidney stones    Hyperlipidemia    Hypertension    Nephrolithiasis    Paroxysmal atrial fibrillation ([place])    Paroxysmal atrial flutter ([place])    a. s/p ablation 2012.   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   Body mass index (BMI) 29.0-29.9, adult 12/15/2020   Sinus node dysfunction ([place]) 06/19/2020   Hypertension 06/19/2020   Glaucoma 04/17/2016   DYSLIPIDEMIA 08/03/2009   ATRIAL FIBRILLATION 08/03/2009   Transient cerebral ischemia 08/03/2009   ATRIAL FLUTTER 06/17/2008    REFERRING DIAG: R26.89 (ICD-10-CM) - Poor balance  THERAPY DIAG:  Unsteadiness on feet  Muscle weakness (generalized)  Pain in right ankle and joints of right foot  Other abnormalities of gait and mobility  Rationale for Evaluation and Treatment  Rehabilitation  PERTINENT HISTORY: Afib, CVA, HTN, Hx of cervical and lumbar fusion  PRECAUTIONS: None  SUBJECTIVE:  Pt presents to PT with reports of continued R heel pain, believes it is due to some building work in the morning. Pt has been compliant with HEP with no adverse effect. Pt is ready to begin PT at this time.   PAIN:  Are you having pain?  Yes: NPRS scale: 3/10 (6/10) Pain location: R heel Pain description: sharp Aggravating factors: prolonged standing Relieving factors: rest, shoes, voltarin   OBJECTIVE: (objective measures completed at initial evaluation unless otherwise dated)  DIAGNOSTIC FINDINGS:            N/A   PATIENT SURVEYS:  FOTO 51% function; 58% predicted   COGNITION:           Overall cognitive status: Within functional limits for tasks assessed                          SENSATION: WFL   MUSCLE LENGTH: Hamstrings: Right 40 deg; Left 40 deg   POSTURE: rounded shoulders, forward head, and slumped sitting posture   PALPATION: TTP to R distal achilles insertion    LOWER EXTREMITY MMT:   AROM Right 08/29/2021 Left 08/29/2021  Hip flexion  5/5  5/5  Hip extension      Hip  abduction 5/5 5/5  Hip adduction 5/5 5/5  Hip internal rotation      Hip external rotation      Knee flexion      Knee extension      Ankle dorsiflexion 5/5 5/5  Ankle plantarflexion 5/5 5/5  Ankle inversion 4/5 5/5  Ankle eversion 5/5 5/5   (Blank rows = not tested)   LOWER EXTREMITY ROM:   MMT Right 08/29/2021 Left 08/29/2021  Ankle dorsiflexion  3 8  Ankle plantarflexion      Ankle inversion      Ankle eversion      (Blank rows = not tested)     LOWER EXTREMITY SPECIAL TESTS:  N/A   FUNCTIONAL TESTS:  30 Second Sit to Stand: 10 reps SLS: 15" R 20" L   GAIT: Distance walked: 40f Assistive device utilized: None Level of assistance: Complete Independence Comments: antalgic gait R   TODAY'S TREATMENT: [facility] Adult PT Treatment:                                                 DATE: 09/10/2021 Therapeutic Exercise: NuStep lvl 6 UE/LE x 4 min while taking subjective Slant board calf stretch 2x30"  Slant board soleus stretch x 30" Heel raise with ball btwn heels 2x15 Mini squat with UE support 2x10  Tandem on foam 2x30" each Wobble board 2x10 fwd/bwd KB deadlift 3x10 25# 4in  Step up 8in 2x10 each Ankle inversion RTB x 10 R  [facility] Adult PT Treatment:                                                DATE: 09/06/2021 Therapeutic Exercise: NuStep lvl 5 UE/LE x 3 min while taking subjective Slant board calf stretch 2x30"  Standing soleus stretch x 20" R Heel raise 2x15 Mini squat with UE support 2x10  SLS x 30" each Tandem on foam 2x30" each Wobble board 2x10 fwd/bwd KB deadlift 3x10 15# 8in  Seated calf stretch R x 30" Ankle inversion RTB x 10 R  [facility] Adult PT Treatment:                                                DATE: 08/29/2021 Therapeutic Exercise: Seated calf stretch R x 30" Seated hamstring stretch x 30" R Ankle inversion RTB x 10 R Mini squat with UE support x 10 SLS x 15" R   PATIENT EDUCATION:  Education details: eval findings, FOTO, HEP, POC Person educated: Patient Education method: Explanation, Demonstration, and Handouts Education comprehension: verbalized understanding and returned demonstration     HOME EXERCISE PROGRAM: Access Code: [email or web address]: [email or web address] Date: 09/10/2021 Prepared by: DOctavio Manns Exercises - Seated Calf Stretch with Strap  - 1-2 x daily - 7 x weekly - 2-3 reps - 30 sec hold - Seated Hamstring Stretch  - 1-2 x daily - 7 x weekly - 2-3 reps - 30 seconds hold - Ankle Inversion with Resistance  - 1-2 x daily - 7 x weekly - 3 sets - 10 reps - Mini Squat with Counter Support  -  1-2 x daily - 7 x weekly - 2-3 sets - 10 reps - Single Leg Stance  - 1-2 x daily - 7 x weekly - 2-3 reps - 30 sec hold - Standing Calf Raise With Small Ball at Heels  - 1 x daily - 7 x weekly -  2 sets - 15 reps - Ankle Eversion with Resistance  - 1 x daily - 7 x weekly - 3 sets - 10 reps   ASSESSMENT:   CLINICAL IMPRESSION: Pt was able to complete all prescribed exercises with no adverse effect or increase in pain. Therapy focused on improving distal LE strength and stability while also incorporating functional motions such as dead-lifting. Pt continues to benefit from skilled PT services and will continue to be seen and progress as able.      OBJECTIVE IMPAIRMENTS decreased activity tolerance, decreased balance, decreased mobility, difficulty walking, decreased ROM, decreased strength, and pain.    ACTIVITY LIMITATIONS carrying, lifting, bending, and squatting   PARTICIPATION LIMITATIONS: occupation and yard work   PERSONAL FACTORS Time since onset of injury/illness/exacerbation and 3+ comorbidities: Afib, CVA, HTN, Hx of cervical and lumbar fusion  are also affecting patient's functional outcome.    GOALS: Goals reviewed with patient? No   SHORT TERM GOALS: Target date: 09/19/2021    Pt will be compliant and knowledgeable with initial HEP for improved comfort and carryover Baseline: initial HEP given  Goal status: MET   2.  Pt will self report right ankle pain no greater than 3/10 for improved comfort and functional ability Baseline: 6/10 at worst Goal status: INITIAL   LONG TERM GOALS: Target date: 10/24/2021    Pt will improve FOTO function score to no less than 58% as proxy for functional improvement Baseline: 51% function Goal status: INITIAL   2.  Pt will increase 30 Second Sit to Stand rep count to no less than 12 reps for improved balance, strength, and functional mobility Baseline: 10 reps  Goal status: INITIAL   3.  Pt will self report right ankle pain no greater than 3/10 for improved comfort and functional ability Baseline: 6/10 at worst Goal status: INITIAL   4.  Pt will increase bilateral SLS time to no less than 30 seconds each for improved balance  and mobility Baseline: SLS: 15" R 20" L Goal status: INITIAL   5.  Pt will be able to squat with hands to floor for improved ability to lift objects for occupation and yard work Baseline: unable Goal status: INITIAL   PLAN: PT FREQUENCY: 1-2x/week   PT DURATION: 8 weeks   PLANNED INTERVENTIONS: Therapeutic exercises, Therapeutic activity, Neuromuscular re-education, Balance training, Gait training, Patient/Family education, Joint mobilization, Dry Needling, Electrical stimulation, Cryotherapy, Moist heat, Vasopneumatic device, Manual therapy, and Re-evaluation   PLAN FOR NEXT SESSION: assess HEP response, progress strength and balance as able    Butterbaugh Chatters, PT 09/10/2021, 12:55 PM

## 2021-09-17 ENCOUNTER — Ambulatory Visit: Payer: Medicare Other

## 2021-09-17 DIAGNOSIS — R2681 Unsteadiness on feet: Secondary | ICD-10-CM | POA: Diagnosis not present

## 2021-09-17 DIAGNOSIS — R2689 Other abnormalities of gait and mobility: Secondary | ICD-10-CM

## 2021-09-17 DIAGNOSIS — M6281 Muscle weakness (generalized): Secondary | ICD-10-CM | POA: Diagnosis not present

## 2021-09-17 DIAGNOSIS — M25571 Pain in right ankle and joints of right foot: Secondary | ICD-10-CM | POA: Diagnosis not present

## 2021-09-17 NOTE — Therapy (Signed)
OUTPATIENT PHYSICAL THERAPY TREATMENT NOTE   Patient Name: [name] [name] MRN: [id number] DOB:Mar 20, 1946, 76 y.o., male Today's Date: 09/17/2021  PCP: Susy Frizzle, MD   REFERRING PROVIDER: Susy Frizzle, MD  END OF SESSION:   PT End of Session - 09/17/21 0827     Visit Number 4    Number of Visits 9    Date for PT Re-Evaluation 10/24/21    Authorization Type Medicare    PT Start Time 0830    PT Stop Time 0910    PT Time Calculation (min) 40 min    Activity Tolerance Patient tolerated treatment well    Behavior During Therapy [facility] for tasks assessed/performed               Past Medical History:  Diagnosis Date   CVA (cerebral infarction) 07-14-09   Dysrhythmia    History of kidney stones    Hyperlipidemia    Hypertension    Nephrolithiasis    Paroxysmal atrial fibrillation ([place])    Paroxysmal atrial flutter ([place])    a. s/p ablation 2012.   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   Body mass index (BMI) 29.0-29.9, adult 12/15/2020   Sinus node dysfunction ([place]) 06/19/2020   Hypertension 06/19/2020   Glaucoma 04/17/2016   DYSLIPIDEMIA 08/03/2009   ATRIAL FIBRILLATION 08/03/2009   Transient cerebral ischemia 08/03/2009   ATRIAL FLUTTER 06/17/2008    REFERRING DIAG: R26.89 (ICD-10-CM) - Poor balance  THERAPY DIAG:  Unsteadiness on feet  Muscle weakness (generalized)  Pain in right ankle and joints of right foot  Other abnormalities of gait and mobility  Rationale for Evaluation and Treatment  Rehabilitation  PERTINENT HISTORY: Afib, CVA, HTN, Hx of cervical and lumbar fusion  PRECAUTIONS: None  SUBJECTIVE:  Pt presents to PT with slight R heel pain. Has been compliant with HEP with no adverse effect. Pt is ready to begin PT at this time.   PAIN:  Are you having pain?  Yes: NPRS scale: 3/10 (6/10) Pain location: R heel Pain description: sharp Aggravating factors: prolonged standing Relieving factors: rest, shoes, voltarin   OBJECTIVE: (objective measures completed at initial evaluation unless otherwise dated)  DIAGNOSTIC FINDINGS:            N/A   PATIENT SURVEYS:  FOTO 51% function; 58% predicted   COGNITION:           Overall cognitive status: Within functional limits for tasks assessed                          SENSATION: WFL   MUSCLE LENGTH: Hamstrings: Right 40 deg; Left 40 deg   POSTURE: rounded shoulders, forward head, and slumped sitting posture   PALPATION: TTP to R distal achilles insertion    LOWER EXTREMITY MMT:   AROM Right 08/29/2021 Left 08/29/2021  Hip flexion  5/5  5/5  Hip extension      Hip abduction 5/5 5/5  Hip adduction 5/5 5/5  Hip internal rotation  Hip external rotation      Knee flexion      Knee extension      Ankle dorsiflexion 5/5 5/5  Ankle plantarflexion 5/5 5/5  Ankle inversion 4/5 5/5  Ankle eversion 5/5 5/5   (Blank rows = not tested)   LOWER EXTREMITY ROM:   MMT Right 08/29/2021 Left 08/29/2021  Ankle dorsiflexion  3 8  Ankle plantarflexion      Ankle inversion      Ankle eversion      (Blank rows = not tested)     LOWER EXTREMITY SPECIAL TESTS:  N/A   FUNCTIONAL TESTS:  30 Second Sit to Stand: 10 reps SLS: 15" R 20" L   GAIT: Distance walked: 68f Assistive device utilized: None Level of assistance: Complete Independence Comments: antalgic gait R   TODAY'S TREATMENT: [facility] Adult PT Treatment:                                                DATE: 09/17/2021 Therapeutic Exercise: NuStep lvl  6 UE/LE x 3 min while taking subjective Slant board calf stretch 2x30"  Slant board soleus stretch x 30" Eccentric heel raise 2x10 4in Tandem on foam 2x30" each Deadlift 3x10 30# - from floor   Step up 8in fwd x 10 each STS 2x10 15# KB BAPS L3 R 2x10 cw/ccw Ankle inversion GTB 2x15 R Wall squat 2x10   [facility] Adult PT Treatment:                                                DATE: 09/10/2021 Therapeutic Exercise: NuStep lvl 6 UE/LE x 4 min while taking subjective Slant board calf stretch 2x30"  Slant board soleus stretch x 30" Heel raise with ball btwn heels 2x15 Mini squat with UE support 2x10  Tandem on foam 2x30" each Wobble board 2x10 fwd/bwd KB deadlift 3x10 25# 4in  Step up 8in 2x10 each Ankle inversion RTB x 10 R  [facility] Adult PT Treatment:                                                DATE: 09/06/2021 Therapeutic Exercise: NuStep lvl 5 UE/LE x 3 min while taking subjective Slant board calf stretch 2x30"  Standing soleus stretch x 20" R Heel raise 2x15 Mini squat with UE support 2x10  SLS x 30" each Tandem on foam 2x30" each Wobble board 2x10 fwd/bwd KB deadlift 3x10 15# 8in  Seated calf stretch R x 30" Ankle inversion RTB x 10 R   PATIENT EDUCATION:  Education details: eval findings, FOTO, HEP, POC Person educated: Patient Education method: Explanation, Demonstration, and Handouts Education comprehension: verbalized understanding and returned demonstration     HOME EXERCISE PROGRAM: Access Code: [email or web address]: [email or web address] Date: 09/10/2021 Prepared by: DOctavio Manns Exercises - Seated Calf Stretch with Strap  - 1-2 x daily - 7 x weekly - 2-3 reps - 30 sec hold - Seated Hamstring Stretch  - 1-2 x daily - 7 x weekly - 2-3 reps - 30 seconds hold - Ankle Inversion with Resistance  - 1-2 x  daily - 7 x weekly - 3 sets - 10 reps - Mini Squat with Counter Support  - 1-2 x daily - 7 x weekly - 2-3 sets - 10 reps - Single Leg Stance  - 1-2 x daily  - 7 x weekly - 2-3 reps - 30 sec hold - Standing Calf Raise With Small Ball at Heels  - 1 x daily - 7 x weekly - 2 sets - 15 reps - Ankle Eversion with Resistance  - 1 x daily - 7 x weekly - 3 sets - 10 reps   ASSESSMENT:   CLINICAL IMPRESSION: Pt was able to complete all prescribed exercises with no adverse effect or increase in pain. Therapy today continued to work on LE strengthening and improving functional mobility. Pt is progressing well with therapy and will continue to be seen and progressed as able per POC.      OBJECTIVE IMPAIRMENTS decreased activity tolerance, decreased balance, decreased mobility, difficulty walking, decreased ROM, decreased strength, and pain.    ACTIVITY LIMITATIONS carrying, lifting, bending, and squatting   PARTICIPATION LIMITATIONS: occupation and yard work   PERSONAL FACTORS Time since onset of injury/illness/exacerbation and 3+ comorbidities: Afib, CVA, HTN, Hx of cervical and lumbar fusion  are also affecting patient's functional outcome.    GOALS: Goals reviewed with patient? No   SHORT TERM GOALS: Target date: 09/19/2021    Pt will be compliant and knowledgeable with initial HEP for improved comfort and carryover Baseline: initial HEP given  Goal status: MET   2.  Pt will self report right ankle pain no greater than 3/10 for improved comfort and functional ability Baseline: 6/10 at worst Goal status: INITIAL   LONG TERM GOALS: Target date: 10/24/2021    Pt will improve FOTO function score to no less than 58% as proxy for functional improvement Baseline: 51% function Goal status: INITIAL   2.  Pt will increase 30 Second Sit to Stand rep count to no less than 12 reps for improved balance, strength, and functional mobility Baseline: 10 reps  Goal status: INITIAL   3.  Pt will self report right ankle pain no greater than 3/10 for improved comfort and functional ability Baseline: 6/10 at worst Goal status: INITIAL   4.  Pt will increase  bilateral SLS time to no less than 30 seconds each for improved balance and mobility Baseline: SLS: 15" R 20" L Goal status: INITIAL   5.  Pt will be able to squat with hands to floor for improved ability to lift objects for occupation and yard work Baseline: unable Goal status: INITIAL   PLAN: PT FREQUENCY: 1-2x/week   PT DURATION: 8 weeks   PLANNED INTERVENTIONS: Therapeutic exercises, Therapeutic activity, Neuromuscular re-education, Balance training, Gait training, Patient/Family education, Joint mobilization, Dry Needling, Electrical stimulation, Cryotherapy, Moist heat, Vasopneumatic device, Manual therapy, and Re-evaluation   PLAN FOR NEXT SESSION: assess HEP response, progress strength and balance as able    Seidman Chatters, PT 09/17/2021, 9:11 AM

## 2021-09-24 ENCOUNTER — Ambulatory Visit: Payer: Medicare Other

## 2021-09-24 DIAGNOSIS — M6281 Muscle weakness (generalized): Secondary | ICD-10-CM | POA: Diagnosis not present

## 2021-09-24 DIAGNOSIS — M25571 Pain in right ankle and joints of right foot: Secondary | ICD-10-CM | POA: Diagnosis not present

## 2021-09-24 DIAGNOSIS — R2681 Unsteadiness on feet: Secondary | ICD-10-CM | POA: Diagnosis not present

## 2021-09-24 DIAGNOSIS — R2689 Other abnormalities of gait and mobility: Secondary | ICD-10-CM | POA: Diagnosis not present

## 2021-09-24 NOTE — Therapy (Signed)
OUTPATIENT PHYSICAL THERAPY TREATMENT NOTE   Patient Name: [name] [name] MRN: [id number] DOB:[birth year], 77 y.o., male Today's Date: 09/24/2021  PCP: Susy Frizzle, MD   REFERRING PROVIDER: Susy Frizzle, MD  END OF SESSION:   PT End of Session - 09/24/21 0831     Visit Number 5    Number of Visits 9    Date for PT Re-Evaluation 10/24/21    Authorization Type Medicare    PT Start Time 0831    PT Stop Time 0910    PT Time Calculation (min) 39 min    Activity Tolerance Patient tolerated treatment well    Behavior During Therapy [facility] for tasks assessed/performed                Past Medical History:  Diagnosis Date   CVA (cerebral infarction) 07-14-09   Dysrhythmia    History of kidney stones    Hyperlipidemia    Hypertension    Nephrolithiasis    Paroxysmal atrial fibrillation ([place])    Paroxysmal atrial flutter ([place])    a. s/p ablation 2012.   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   Body mass index (BMI) 29.0-29.9, adult 12/15/2020   Sinus node dysfunction ([place]) 06/19/2020   Hypertension 06/19/2020   Glaucoma 04/17/2016   DYSLIPIDEMIA 08/03/2009   ATRIAL FIBRILLATION 08/03/2009   Transient cerebral ischemia 08/03/2009   ATRIAL FLUTTER 06/17/2008    REFERRING DIAG: R26.89 (ICD-10-CM) - Poor balance  THERAPY DIAG:  Unsteadiness on feet  Muscle weakness (generalized)  Rationale for Evaluation and Treatment Rehabilitation  PERTINENT HISTORY: Afib, CVA, HTN, Hx of cervical and lumbar  fusion  PRECAUTIONS: None  SUBJECTIVE:  Pt presents to PT with continued reports of R heel pain. Has continued HEP compliance with no adverse effect. Pt is ready to begin PT at this time.   PAIN:  Are you having pain?  Yes: NPRS scale: 3/10 (6/10) Pain location: R heel Pain description: sharp Aggravating factors: prolonged standing Relieving factors: rest, shoes, voltarin   OBJECTIVE: (objective measures completed at initial evaluation unless otherwise dated)  DIAGNOSTIC FINDINGS:            N/A   PATIENT SURVEYS:  FOTO 51% function; 58% predicted   COGNITION:           Overall cognitive status: Within functional limits for tasks assessed                          SENSATION: WFL   MUSCLE LENGTH: Hamstrings: Right 40 deg; Left 40 deg   POSTURE: rounded shoulders, forward head, and slumped sitting posture   PALPATION: TTP to R distal achilles insertion    LOWER EXTREMITY MMT:   AROM Right 08/29/2021 Left 08/29/2021  Hip flexion  5/5  5/5  Hip extension      Hip abduction 5/5 5/5  Hip adduction 5/5 5/5  Hip internal rotation      Hip external rotation      Knee flexion  Knee extension      Ankle dorsiflexion 5/5 5/5  Ankle plantarflexion 5/5 5/5  Ankle inversion 4/5 5/5  Ankle eversion 5/5 5/5   (Blank rows = not tested)   LOWER EXTREMITY ROM:   MMT Right 08/29/2021 Left 08/29/2021  Ankle dorsiflexion  3 8  Ankle plantarflexion      Ankle inversion      Ankle eversion      (Blank rows = not tested)     LOWER EXTREMITY SPECIAL TESTS:  N/A   FUNCTIONAL TESTS:  30 Second Sit to Stand: 10 reps SLS: 15" R 20" L   GAIT: Distance walked: 18ft Assistive device utilized: None Level of assistance: Complete Independence Comments: antalgic gait R   TODAY'S TREATMENT: [facility] Adult PT Treatment:                                                DATE: 09/24/2021 Therapeutic Exercise: NuStep lvl 6 UE/LE x 3 min while taking subjective Slant board calf  stretch x 45"  Slant board soleus stretch x 45" Eccentric heel raise 2x10 4in SLS on foam 2x30" each Standing repeated lunge to 2 foam 2x10 each Deadlift 3x10 30# - from floor   Step up 8in fwd x 10 each Long sitting eccentric ankle DF 10# KB x 10 BAPS L3 R 2x10 cw/ccw Ankle inversion GTB 2x15 R Wall squat 2x10   [facility] Adult PT Treatment:                                                DATE: 09/17/2021 Therapeutic Exercise: NuStep lvl 6 UE/LE x 3 min while taking subjective Slant board calf stretch 2x30"  Slant board soleus stretch x 30" Eccentric heel raise 2x10 4in Tandem on foam 2x30" each Deadlift 3x10 30# - from floor   Step up 8in fwd x 10 each STS 2x10 15# KB BAPS L3 R 2x10 cw/ccw Ankle inversion GTB 2x15 R Wall squat 2x10   [facility] Adult PT Treatment:                                                DATE: 09/10/2021 Therapeutic Exercise: NuStep lvl 6 UE/LE x 4 min while taking subjective Slant board calf stretch 2x30"  Slant board soleus stretch x 30" Heel raise with ball btwn heels 2x15 Mini squat with UE support 2x10  Tandem on foam 2x30" each Wobble board 2x10 fwd/bwd KB deadlift 3x10 25# 4in  Step up 8in 2x10 each Ankle inversion RTB x 10 R  PATIENT EDUCATION:  Education details: eval findings, FOTO, HEP, POC Person educated: Patient Education method: Explanation, Demonstration, and Handouts Education comprehension: verbalized understanding and returned demonstration     HOME EXERCISE PROGRAM: Access Code: [email or web address] URL: [email or web address] Date: 09/24/2021 Prepared by: Octavio Manns  Exercises - Seated Calf Stretch with Strap  - 1-2 x daily - 7 x weekly - 2-3 reps - 30 sec hold - Seated Hamstring Stretch  - 1-2 x daily - 7 x weekly - 2-3 reps - 30 seconds hold - Ankle Inversion with Resistance  - 1-2 x  daily - 7 x weekly - 3 sets - 10 reps - Mini Squat with Counter Support  - 1-2 x daily - 7 x weekly - 2-3 sets - 10 reps - Single Leg Stance  -  1-2 x daily - 7 x weekly - 2-3 reps - 30 sec hold - Standing Calf Raise With Small Ball at [place]  - 1 x daily - 7 x weekly - 2 sets - 15 reps - Ankle Eversion with Resistance  - 1 x daily - 7 x weekly - 3 sets - 10 reps - Lunge with Counter Support  - 1 x daily - 7 x weekly - 2 sets - 10 reps - Half Deadlift with Kettlebell  - 1 x daily - 7 x weekly - 3 sets - 10 reps - 30lb dumbbell hold   ASSESSMENT:   CLINICAL IMPRESSION: Pt was able to complete all prescribed exercises with no adverse effect or increase in pain. Therapy continued to focus on functional mobility and balance training while improving R ankle strength/stability and proximal hip strength. He continues to benefit from skilled PT with HEP update to work on continued progression at home. Will continue to progress per POC as prescribed.      OBJECTIVE IMPAIRMENTS decreased activity tolerance, decreased balance, decreased mobility, difficulty walking, decreased ROM, decreased strength, and pain.    ACTIVITY LIMITATIONS carrying, lifting, bending, and squatting   PARTICIPATION LIMITATIONS: occupation and yard work   PERSONAL FACTORS Time since onset of injury/illness/exacerbation and 3+ comorbidities: Afib, CVA, HTN, Hx of cervical and lumbar fusion  are also affecting patient's functional outcome.    GOALS: Goals reviewed with patient? No   SHORT TERM GOALS: Target date: 09/19/2021    Pt will be compliant and knowledgeable with initial HEP for improved comfort and carryover Baseline: initial HEP given  Goal status: MET   2.  Pt will self report right ankle pain no greater than 3/10 for improved comfort and functional ability Baseline: 6/10 at worst Goal status: INITIAL   LONG TERM GOALS: Target date: 10/24/2021    Pt will improve FOTO function score to no less than 58% as proxy for functional improvement Baseline: 51% function Goal status: INITIAL   2.  Pt will increase 30 Second Sit to Stand rep count to no less than  12 reps for improved balance, strength, and functional mobility Baseline: 10 reps  Goal status: INITIAL   3.  Pt will self report right ankle pain no greater than 3/10 for improved comfort and functional ability Baseline: 6/10 at worst Goal status: INITIAL   4.  Pt will increase bilateral SLS time to no less than 30 seconds each for improved balance and mobility Baseline: SLS: 15" R 20" L Goal status: INITIAL   5.  Pt will be able to squat with hands to floor for improved ability to lift objects for occupation and yard work Baseline: unable Goal status: INITIAL   PLAN: PT FREQUENCY: 1-2x/week   PT DURATION: 8 weeks   PLANNED INTERVENTIONS: Therapeutic exercises, Therapeutic activity, Neuromuscular re-education, Balance training, Gait training, Patient/Family education, Joint mobilization, Dry Needling, Electrical stimulation, Cryotherapy, Moist heat, Vasopneumatic device, Manual therapy, and Re-evaluation   PLAN FOR NEXT SESSION: assess HEP response, progress strength and balance as able    Garczynski Chatters, PT 09/24/2021, 9:14 AM

## 2021-10-08 ENCOUNTER — Ambulatory Visit: Payer: Medicare Other | Attending: Family Medicine

## 2021-10-08 DIAGNOSIS — R2689 Other abnormalities of gait and mobility: Secondary | ICD-10-CM | POA: Diagnosis not present

## 2021-10-08 DIAGNOSIS — R2681 Unsteadiness on feet: Secondary | ICD-10-CM | POA: Diagnosis not present

## 2021-10-08 DIAGNOSIS — M6281 Muscle weakness (generalized): Secondary | ICD-10-CM | POA: Insufficient documentation

## 2021-10-08 DIAGNOSIS — M25571 Pain in right ankle and joints of right foot: Secondary | ICD-10-CM | POA: Insufficient documentation

## 2021-10-08 NOTE — Therapy (Signed)
OUTPATIENT PHYSICAL THERAPY TREATMENT NOTE   Patient Name: [name] [name] MRN: [id number] DOB:Nov 19, 1945, 76 y.o., male Today's Date: 10/08/2021  PCP: Susy Frizzle, MD   REFERRING PROVIDER: Susy Frizzle, MD  END OF SESSION:   PT End of Session - 10/08/21 0830     Visit Number 6    Number of Visits 9    Date for PT Re-Evaluation 10/24/21    Authorization Type Medicare    PT Start Time 0830    PT Stop Time 0908    PT Time Calculation (min) 38 min    Activity Tolerance Patient tolerated treatment well    Behavior During Therapy [facility] for tasks assessed/performed                 Past Medical History:  Diagnosis Date   CVA (cerebral infarction) 07-14-09   Dysrhythmia    History of kidney stones    Hyperlipidemia    Hypertension    Nephrolithiasis    Paroxysmal atrial fibrillation ([place])    Paroxysmal atrial flutter ([place])    a. s/p ablation 2012.   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   Body mass index (BMI) 29.0-29.9, adult 12/15/2020   Sinus node dysfunction ([place]) 06/19/2020   Hypertension 06/19/2020   Glaucoma 04/17/2016   DYSLIPIDEMIA 08/03/2009   ATRIAL FIBRILLATION 08/03/2009   Transient cerebral ischemia 08/03/2009   ATRIAL FLUTTER 06/17/2008    REFERRING DIAG: R26.89 (ICD-10-CM) - Poor balance  THERAPY DIAG:  Unsteadiness on feet  Muscle weakness (generalized)  Pain in right ankle and joints of right foot  Other abnormalities of gait and mobility  Rationale for Evaluation and Treatment  Rehabilitation  PERTINENT HISTORY: Afib, CVA, HTN, Hx of cervical and lumbar fusion  PRECAUTIONS: None  SUBJECTIVE:  Pt presents to PT with continued R ankle pain. Has been compliant with advanced HEP with no adverse effect or increase in pain. Pt is ready to begin PT at this time.   PAIN:  Are you having pain?  Yes: NPRS scale: 3/10 (6/10) Pain location: R heel Pain description: sharp Aggravating factors: prolonged standing Relieving factors: rest, shoes, voltarin   OBJECTIVE: (objective measures completed at initial evaluation unless otherwise dated)  DIAGNOSTIC FINDINGS:            N/A   PATIENT SURVEYS:  FOTO 51% function; 58% predicted   COGNITION:           Overall cognitive status: Within functional limits for tasks assessed                          SENSATION: WFL   MUSCLE LENGTH: Hamstrings: Right 40 deg; Left 40 deg   POSTURE: rounded shoulders, forward head, and slumped sitting posture   PALPATION: TTP to R distal achilles insertion    LOWER EXTREMITY MMT:   AROM Right 08/29/2021 Left 08/29/2021  Hip flexion  5/5  5/5  Hip extension      Hip abduction 5/5 5/5  Hip adduction  5/5 5/5  Hip internal rotation      Hip external rotation      Knee flexion      Knee extension      Ankle dorsiflexion 5/5 5/5  Ankle plantarflexion 5/5 5/5  Ankle inversion 4/5 5/5  Ankle eversion 5/5 5/5   (Blank rows = not tested)   LOWER EXTREMITY ROM:   MMT Right 08/29/2021 Left 08/29/2021  Ankle dorsiflexion  3 8  Ankle plantarflexion      Ankle inversion      Ankle eversion      (Blank rows = not tested)     LOWER EXTREMITY SPECIAL TESTS:  N/A   FUNCTIONAL TESTS:  30 Second Sit to Stand: 10 reps SLS: 15" R 20" L   GAIT: Distance walked: 39f Assistive device utilized: None Level of assistance: Complete Independence Comments: antalgic gait R   TODAY'S TREATMENT: [facility] Adult PT Treatment:                                                DATE:  10/08/2021 Therapeutic Exercise: NuStep lvl 6 UE/LE x 4 min while taking subjective Wall squat 3x10 Slant board calf stretch x 45"  Slant board soleus stretch x 45" Eccentric heel raise 2x10 4in SLS on foam 2x30" each Standing repeated lunge to 1 foam 2x10 each Deadlift 3x8 45# - from floor   Long sitting eccentric ankle DF 10# KB 2x15 STS 2x10 10# KB BAPS L3 R 2x15 cw/ccw  [facility] Adult PT Treatment:                                                DATE: 09/24/2021 Therapeutic Exercise: NuStep lvl 6 UE/LE x 3 min while taking subjective Slant board calf stretch x 45"  Slant board soleus stretch x 45" Eccentric heel raise 2x10 4in SLS on foam 2x30" each Standing repeated lunge to 2 foam 2x10 each Deadlift 3x10 30# - from floor   Step up 8in fwd x 10 each Long sitting eccentric ankle DF 10# KB x 10 BAPS L3 R 2x10 cw/ccw Ankle inversion GTB 2x15 R Wall squat 2x10   [facility] Adult PT Treatment:                                                DATE: 09/17/2021 Therapeutic Exercise: NuStep lvl 6 UE/LE x 3 min while taking subjective Slant board calf stretch 2x30"  Slant board soleus stretch x 30" Eccentric heel raise 2x10 4in Tandem on foam 2x30" each Deadlift 3x10 30# - from floor   Step up 8in fwd x 10 each STS 2x10 15# KB BAPS L3 R 2x10 cw/ccw Ankle inversion GTB 2x15 R Wall squat 2x10   PATIENT EDUCATION:  Education details: continue HEP Person educated: Patient Education method: Explanation, Demonstration, and Handouts Education comprehension: verbalized understanding and returned demonstration     HOME EXERCISE PROGRAM: Access Code: [email or web address]: [email or web address] Date: 09/24/2021 Prepared by: DOctavio Manns Exercises - Seated Calf Stretch with Strap  - 1-2 x daily - 7 x weekly - 2-3 reps - 30 sec  hold - Seated Hamstring Stretch  - 1-2 x daily - 7 x weekly - 2-3 reps - 30 seconds hold - Ankle Inversion with Resistance  - 1-2 x daily - 7 x weekly - 3 sets  - 10 reps - Mini Squat with Counter Support  - 1-2 x daily - 7 x weekly - 2-3 sets - 10 reps - Single Leg Stance  - 1-2 x daily - 7 x weekly - 2-3 reps - 30 sec hold - Standing Calf Raise With Small Ball at Heels  - 1 x daily - 7 x weekly - 2 sets - 15 reps - Ankle Eversion with Resistance  - 1 x daily - 7 x weekly - 3 sets - 10 reps - Lunge with Counter Support  - 1 x daily - 7 x weekly - 2 sets - 10 reps - Half Deadlift with Kettlebell  - 1 x daily - 7 x weekly - 3 sets - 10 reps - 30lb dumbbell hold   ASSESSMENT:   CLINICAL IMPRESSION: Pt was able to complete all prescribed exercises with no adverse effect or increase in pain. Therapy focused on improving LE strength and stability in order to decrease pain and improve functional ability. He continues to progress very well with therapy and will continue to be seen and progressed as able per POC.      OBJECTIVE IMPAIRMENTS decreased activity tolerance, decreased balance, decreased mobility, difficulty walking, decreased ROM, decreased strength, and pain.    ACTIVITY LIMITATIONS carrying, lifting, bending, and squatting   PARTICIPATION LIMITATIONS: occupation and yard work   PERSONAL FACTORS Time since onset of injury/illness/exacerbation and 3+ comorbidities: Afib, CVA, HTN, Hx of cervical and lumbar fusion  are also affecting patient's functional outcome.    GOALS: Goals reviewed with patient? No   SHORT TERM GOALS: Target date: 09/19/2021    Pt will be compliant and knowledgeable with initial HEP for improved comfort and carryover Baseline: initial HEP given  Goal status: MET   2.  Pt will self report right ankle pain no greater than 3/10 for improved comfort and functional ability Baseline: 6/10 at worst Goal status: INITIAL   LONG TERM GOALS: Target date: 10/24/2021    Pt will improve FOTO function score to no less than 58% as proxy for functional improvement Baseline: 51% function Goal status: INITIAL   2.  Pt will  increase 30 Second Sit to Stand rep count to no less than 12 reps for improved balance, strength, and functional mobility Baseline: 10 reps  Goal status: INITIAL   3.  Pt will self report right ankle pain no greater than 3/10 for improved comfort and functional ability Baseline: 6/10 at worst Goal status: INITIAL   4.  Pt will increase bilateral SLS time to no less than 30 seconds each for improved balance and mobility Baseline: SLS: 15" R 20" L Goal status: INITIAL   5.  Pt will be able to squat with hands to floor for improved ability to lift objects for occupation and yard work Baseline: unable Goal status: INITIAL   PLAN: PT FREQUENCY: 1-2x/week   PT DURATION: 8 weeks   PLANNED INTERVENTIONS: Therapeutic exercises, Therapeutic activity, Neuromuscular re-education, Balance training, Gait training, Patient/Family education, Joint mobilization, Dry Needling, Electrical stimulation, Cryotherapy, Moist heat, Vasopneumatic device, Manual therapy, and Re-evaluation   PLAN FOR NEXT SESSION: assess HEP response, progress strength and balance as able    Tanney Chatters, PT 10/08/2021, 9:09 AM

## 2021-10-15 ENCOUNTER — Ambulatory Visit: Payer: Medicare Other

## 2021-10-15 DIAGNOSIS — R2681 Unsteadiness on feet: Secondary | ICD-10-CM

## 2021-10-15 DIAGNOSIS — M6281 Muscle weakness (generalized): Secondary | ICD-10-CM | POA: Diagnosis not present

## 2021-10-15 DIAGNOSIS — R2689 Other abnormalities of gait and mobility: Secondary | ICD-10-CM | POA: Diagnosis not present

## 2021-10-15 DIAGNOSIS — M25571 Pain in right ankle and joints of right foot: Secondary | ICD-10-CM | POA: Diagnosis not present

## 2021-10-15 NOTE — Therapy (Signed)
OUTPATIENT PHYSICAL THERAPY TREATMENT NOTE/DISCHARGE  PHYSICAL THERAPY DISCHARGE SUMMARY  Visits from Start of Care: 7   Current functional level related to goals / functional outcomes: See goals and objective   Remaining deficits: See goals and objective   Education / Equipment: HEP   Patient agrees to discharge. Patient goals were  mostly met . Patient is being discharged due to being pleased with the current functional level.   Patient Name: [name] [name] MRN: [id number] DOB:January 09, 1946, 76 y.o., male Today's Date: 10/15/2021  PCP: Susy Frizzle, MD   REFERRING PROVIDER: Susy Frizzle, MD  END OF SESSION:   PT End of Session - 10/15/21 0922     Visit Number 7    Number of Visits 9    Date for PT Re-Evaluation 10/24/21    Authorization Type Medicare    PT Start Time 0922    PT Stop Time 0945    PT Time Calculation (min) 23 min    Activity Tolerance Patient tolerated treatment well    Behavior During Therapy [facility] for tasks assessed/performed                  Past Medical History:  Diagnosis Date   CVA (cerebral infarction) 07-14-09   Dysrhythmia    History of kidney stones    Hyperlipidemia    Hypertension    Nephrolithiasis    Paroxysmal atrial fibrillation ([place])    Paroxysmal atrial flutter ([place])    a. s/p ablation 2012.   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   Body mass index (BMI) 29.0-29.9, adult 12/15/2020   Sinus node dysfunction ([place]) 06/19/2020   Hypertension 06/19/2020   Glaucoma  04/17/2016   DYSLIPIDEMIA 08/03/2009   ATRIAL FIBRILLATION 08/03/2009   Transient cerebral ischemia 08/03/2009   ATRIAL FLUTTER 06/17/2008    REFERRING DIAG: R26.89 (ICD-10-CM) - Poor balance  THERAPY DIAG:  Unsteadiness on feet  Muscle weakness (generalized)  Rationale for Evaluation and Treatment Rehabilitation  PERTINENT HISTORY: Afib, CVA, HTN, Hx of cervical and lumbar fusion  PRECAUTIONS: None  SUBJECTIVE:  Pt presents to PT with reports of continued R heel pain. Has continued HEP compliance with no adverse effect. Pt is ready to begin PT at this time.   PAIN:  Are you having pain?  Yes: NPRS scale: 4/10 (6/10) Pain location: R heel Pain description: sharp Aggravating factors: prolonged standing Relieving factors: rest, shoes, voltarin   OBJECTIVE: (objective measures completed at initial evaluation unless otherwise dated)  DIAGNOSTIC FINDINGS:            N/A   PATIENT SURVEYS:  FOTO 47% function   COGNITION:           Overall cognitive status: Within functional limits for tasks assessed                          SENSATION: WFL   MUSCLE LENGTH: Hamstrings: Right 40 deg; Left 40 deg   POSTURE: rounded shoulders, forward head, and slumped sitting  posture   PALPATION: TTP to R distal achilles insertion    LOWER EXTREMITY MMT:   AROM Right 08/29/2021 Left 08/29/2021  Hip flexion  5/5  5/5  Hip extension      Hip abduction 5/5 5/5  Hip adduction 5/5 5/5  Hip internal rotation      Hip external rotation      Knee flexion      Knee extension      Ankle dorsiflexion 5/5 5/5  Ankle plantarflexion 5/5 5/5  Ankle inversion 4/5 5/5  Ankle eversion 5/5 5/5   (Blank rows = not tested)   LOWER EXTREMITY ROM:   MMT Right 08/29/2021 Left 08/29/2021  Ankle dorsiflexion  3 8  Ankle plantarflexion      Ankle inversion      Ankle eversion      (Blank rows = not tested)     LOWER EXTREMITY SPECIAL TESTS:  N/A   FUNCTIONAL TESTS:  30 Second Sit to  Stand: 16 reps SLS: 30" R 30" L   GAIT: Distance walked: 63f Assistive device utilized: None Level of assistance: Complete Independence Comments: antalgic gait R   TODAY'S TREATMENT: [facility] Adult PT Treatment:                                                DATE: 10/15/2021 Therapeutic Exercise: Standing calf stretch x 30" R Standing Soleus stretch x 30" R SLS x 30" each Review of HEP Therapeutic Activity: Assessment of tests/measures, goals, and outcomes for discharge assessment  [facility] Adult PT Treatment:                                                DATE: 10/08/2021 Therapeutic Exercise: NuStep lvl 6 UE/LE x 4 min while taking subjective Wall squat 3x10 Slant board calf stretch x 45"  Slant board soleus stretch x 45" Eccentric heel raise 2x10 4in SLS on foam 2x30" each Standing repeated lunge to 1 foam 2x10 each Deadlift 3x8 45# - from floor   Long sitting eccentric ankle DF 10# KB 2x15 STS 2x10 10# KB BAPS L3 R 2x15 cw/ccw  [facility] Adult PT Treatment:                                                DATE: 09/24/2021 Therapeutic Exercise: NuStep lvl 6 UE/LE x 3 min while taking subjective Slant board calf stretch x 45"  Slant board soleus stretch x 45" Eccentric heel raise 2x10 4in SLS on foam 2x30" each Standing repeated lunge to 2 foam 2x10 each Deadlift 3x10 30# - from floor   Step up 8in fwd x 10 each Long sitting eccentric ankle DF 10# KB x 10 BAPS L3 R 2x10 cw/ccw Ankle inversion GTB 2x15 R Wall squat 2x10   PATIENT EDUCATION:  Education details: continue HEP Person educated: Patient Education method: Explanation, Demonstration, and Handouts Education comprehension: verbalized understanding and returned demonstration     HOME EXERCISE PROGRAM: Access Code: [email or web address]: [email or web address] Date: 10/15/2021 Prepared by: DOctavio Manns Exercises - Seated Calf Stretch with Strap  - 1-2 x daily -  7 x weekly - 2-3 reps - 30 sec hold - Seated  Hamstring Stretch  - 1-2 x daily - 7 x weekly - 2-3 reps - 30 seconds hold - Gastroc Stretch on Wall  - 1-2 x daily - 7 x weekly - 2-3 reps - 30 sec hold - Soleus Stretch on Wall  - 1-2 x daily - 7 x weekly - 2-3 reps - 30 sec hold - Ankle Inversion with Resistance  - 3-4 x weekly - 3 sets - 10 reps - Ankle Eversion with Resistance  - 3-4 x weekly - 3 sets - 10 reps - Mini Squat with Counter Support  - 3-4 x weekly - 2-3 sets - 10 reps - Single Leg Stance  - 3-4 x weekly - 2-3 reps - 30 sec hold - Standing Calf Raise With Small Ball at Heels  - 3-4 x weekly - 2 sets - 15 reps - Lunge with Counter Support  - 3-4 x weekly - 2 sets - 10 reps - Half Deadlift with Kettlebell  - 3-4 x weekly - 3 sets - 10 reps - 30lb dumbbell hold   ASSESSMENT:   CLINICAL IMPRESSION: Pt was able to complete prescribed exercises and demonstrated knowledge of HEP with no adverse effect. Over the course of PT treatment pt was able to improve functional mobility and balance. While he met all goals for physical performance measures, his FOTO score did slightly decrease. Pt does subjectively note improvement in mobility but does continue to have slight R heel pain. He should continue to improve with HEP compliance and no longer requires skilled PT services at this time. Pt in agreement with current plan and is ready to discharge from PT at this time.      OBJECTIVE IMPAIRMENTS decreased activity tolerance, decreased balance, decreased mobility, difficulty walking, decreased ROM, decreased strength, and pain.    ACTIVITY LIMITATIONS carrying, lifting, bending, and squatting   PARTICIPATION LIMITATIONS: occupation and yard work   PERSONAL FACTORS Time since onset of injury/illness/exacerbation and 3+ comorbidities: Afib, CVA, HTN, Hx of cervical and lumbar fusion  are also affecting patient's functional outcome.    GOALS: Goals reviewed with patient? No   SHORT TERM GOALS: Target date: 09/19/2021    Pt will be  compliant and knowledgeable with initial HEP for improved comfort and carryover Baseline: initial HEP given  Goal status: MET   2.  Pt will self report right ankle pain no greater than 3/10 for improved comfort and functional ability Baseline: 6/10 at worst Goal status: MOSTLY MET   LONG TERM GOALS: Target date: 10/24/2021    Pt will improve FOTO function score to no less than 58% as proxy for functional improvement Baseline: 51% function 10/15/2021: 47% function Goal status: NOT MET   2.  Pt will increase 30 Second Sit to Stand rep count to no less than 12 reps for improved balance, strength, and functional mobility Baseline: 10 reps  10/15/2021: 16 reps Goal status: MET   3.  Pt will self report right ankle pain no greater than 3/10 for improved comfort and functional ability Baseline: 6/10 at worst Goal status: MOSTLY MET   4.  Pt will increase bilateral SLS time to no less than 30 seconds each for improved balance and mobility Baseline: SLS: 15" R 20" L 10/15/2021: 30" each Goal status: MET   5.  Pt will be able to squat with hands to floor for improved ability to lift objects for occupation and yard work Baseline:  unable Goal status: MET   PLAN: PT FREQUENCY: 1-2x/week   PT DURATION: 8 weeks   PLANNED INTERVENTIONS: Therapeutic exercises, Therapeutic activity, Neuromuscular re-education, Balance training, Gait training, Patient/Family education, Joint mobilization, Dry Needling, Electrical stimulation, Cryotherapy, Moist heat, Vasopneumatic device, Manual therapy, and Re-evaluation   PLAN FOR NEXT SESSION: assess HEP response, progress strength and balance as able    Coats Chatters, PT 10/15/2021, 9:46 AM

## 2021-10-29 ENCOUNTER — Other Ambulatory Visit: Payer: Self-pay | Admitting: Family Medicine

## 2021-11-26 ENCOUNTER — Other Ambulatory Visit: Payer: Self-pay | Admitting: Internal Medicine

## 2021-12-20 DIAGNOSIS — H40013 Open angle with borderline findings, low risk, bilateral: Secondary | ICD-10-CM | POA: Diagnosis not present

## 2022-01-22 ENCOUNTER — Ambulatory Visit (INDEPENDENT_AMBULATORY_CARE_PROVIDER_SITE_OTHER): Payer: Medicare Other | Admitting: Family Medicine

## 2022-01-22 ENCOUNTER — Other Ambulatory Visit: Payer: Self-pay | Admitting: Family Medicine

## 2022-01-22 ENCOUNTER — Other Ambulatory Visit: Payer: Self-pay | Admitting: *Deleted

## 2022-01-22 VITALS — BP 132/92 | HR 116 | Wt 214.0 lb

## 2022-01-22 DIAGNOSIS — I4891 Unspecified atrial fibrillation: Secondary | ICD-10-CM

## 2022-01-22 DIAGNOSIS — I10 Essential (primary) hypertension: Secondary | ICD-10-CM

## 2022-01-22 MED ORDER — RIVAROXABAN 20 MG PO TABS: 20.0000 mg | ORAL_TABLET | Freq: Every day | ORAL | 1 refills | Status: DC

## 2022-01-22 MED ORDER — METOPROLOL SUCCINATE ER 25 MG PO TB24: 25.0000 mg | ORAL_TABLET | Freq: Every day | ORAL | 3 refills | Status: DC

## 2022-01-22 NOTE — Telephone Encounter (Signed)
Xarelto '20mg'$  refill request received. Pt is 76 years old, weight-97.1kg, Crea-1.06 on 08/13/2021, last seen by Tommye Standard on 08/10/2021, Diagnosis-Afib, CrCl-78.17m/min; Dose is appropriate based on dosing criteria. Will send in refill to requested pharmacy.

## 2022-01-22 NOTE — Telephone Encounter (Signed)
Requested Prescriptions  Pending Prescriptions Disp Refills  . losartan (COZAAR) 50 MG tablet [Pharmacy Med Name: LOSARTAN '50MG'$  TABLETS] 90 tablet 0    Sig: TAKE 1 TABLET(50 MG) BY MOUTH DAILY     Cardiovascular:  Angiotensin Receptor Blockers Passed - 01/22/2022  8:55 AM      Passed - Cr in normal range and within 180 days    Creat  Date Value Ref Range Status  08/13/2021 1.06 0.70 - 1.28 mg/dL Final         Passed - K in normal range and within 180 days    Potassium  Date Value Ref Range Status  08/13/2021 4.4 3.5 - 5.3 mmol/L Final         Passed - Patient is not pregnant      Passed - Last BP in normal range    BP Readings from Last 1 Encounters:  01/22/22 (!) 132/92         Passed - Valid encounter within last 6 months    Recent Outpatient Visits          5 months ago Atrial fibrillation, unspecified type ([place])   [place] [name], Cammie Mcgee, MD   1 year ago Right sided sciatica   [place] Susy Frizzle, MD   1 year ago Atrial fibrillation, unspecified type [facility])   [place] [name], Cammie Mcgee, MD   2 years ago [place] vision, bilateral   [place] [name], Cammie Mcgee, MD   3 years ago Routine general medical examination at a health care facility   [place], Cammie Mcgee, MD      Future Appointments            In 3 months [name], Cammie Mcgee, MD [place]

## 2022-01-22 NOTE — Progress Notes (Signed)
Subjective:    Patient ID: [name] [name], male    DOB: January 16, 1946, 76 y.o.   MRN: [id number]  HPI Patient has a history of atrial fibrillation.  Over the last 2 days, he states he feels like his heart "feels funny".  He states that it feels different than when he previously was in atrial fibrillation.  He supposed be taking flecainide twice daily as rhythm control.  At his last several visits with his cardiologist, he has been in normal sinus rhythm.  However he states he has been inconsistently taking his flecainide.  He is only been taking 1 dose a day.  Then approximately 2 days ago, he states that he started feeling his heart beating irregularly.  Today on his EKG, the patient is in atrial fibrillation.  His heart rate is varying between 110 and 125 bpm.  At home he states is up to 140.  He denies any chest pain.  He denies any shortness of breath.  He denies any syncope or presyncope.  There is no evidence of any ventricular strain on his EKG today.  Patient does not have any peripheral edema and there are no crackles appreciated on his pulmonary exam. Past Medical History:  Diagnosis Date   CVA (cerebral infarction) 07-14-09   Dysrhythmia    History of kidney stones    Hyperlipidemia    Hypertension    Nephrolithiasis    Paroxysmal atrial fibrillation (HCC)    Paroxysmal atrial flutter ([place])    a. s/p ablation 2012.   Prostate cancer [facility])    Stroke [facility])    Past Surgical History:  Procedure Laterality Date   APPENDECTOMY     BACK SURGERY     CYSTOSCOPY/URETEROSCOPY/HOLMIUM LASER/STENT PLACEMENT Left 09/19/2020   Procedure: CYSTOSCOPY RETROGRADE PYELOGRAM URETEROSCOPY/HOLMIUM LASER/STENT PLACEMENT;  Surgeon: Irine Seal, MD;  Location: [facility];  Service: Urology;  Laterality: Left;   EYE SURGERY     PROSTATECTOMY  2005   Current Outpatient Medications on File Prior to Visit  Medication Sig Dispense Refill   atorvastatin (LIPITOR) 20 MG tablet TAKE 1 TABLET(20 MG) BY MOUTH DAILY 90  tablet 3   flecainide (TAMBOCOR) 150 MG tablet TAKE 1 TABLET BY MOUTH TWICE DAILY 180 tablet 2   losartan (COZAAR) 50 MG tablet TAKE 1 TABLET(50 MG) BY MOUTH DAILY 90 tablet 2   rivaroxaban (XARELTO) 20 MG TABS tablet Take 1 tablet (20 mg total) by mouth daily with supper. OVERDUE for follow-up, MUST see provider for FUTURE refills. 90 tablet 0   traMADol (ULTRAM) 50 MG tablet TAKE 1 TABLET(50 MG) BY MOUTH EVERY 6 HOURS AS NEEDED 120 tablet 0   No current facility-administered medications on file prior to visit.     No Known Allergies Social History   Socioeconomic History   Marital status: Married    Spouse name: Not on file   Number of children: 2   Years of education: Not on file   Highest education level: Not on file  Occupational History   Occupation: Surveyor, quantity: [employer]  Tobacco Use   Smoking status: Never   Smokeless tobacco: Never  Vaping Use   Vaping Use: Never used  Substance and Sexual Activity   Alcohol use: No   Drug use: No   Sexual activity: Not on file  Other Topics Concern   Not on file  Social History Narrative   Not on file   Social Determinants of Radio broadcast assistant  Strain: Low Risk  (03/08/2021)   Overall Financial Resource Strain (CARDIA)    Difficulty of Paying Living Expenses: Not hard at all  Food Insecurity: No Food Insecurity (03/08/2021)   Hunger Vital Sign    Worried About Running Out of Food in the Last Year: Never true    Ran Out of Food in the Last Year: Never true  Transportation Needs: No Transportation Needs (03/08/2021)   PRAPARE - Hydrologist (Medical): No    Lack of Transportation (Non-Medical): No  Physical Activity: Insufficiently Active (03/08/2021)   Exercise Vital Sign    Days of Exercise per Week: 3 days    Minutes of Exercise per Session: 20 min  Stress: No Stress Concern Present (03/08/2021)   [place]    Feeling of Stress : Not at all  Social Connections: [place] (03/08/2021)   Social Connection and Isolation Panel [NHANES]    Frequency of Communication with Friends and Family: More than three times a week    Frequency of Social Gatherings with Friends and Family: More than three times a week    Attends Religious Services: More than 4 times per year    Active Member of [employer] or Organizations: Yes    Attends Archivist Meetings: More than 4 times per year    Marital Status: Married  Human resources officer Violence: Not At Risk (03/08/2021)   Humiliation, Afraid, Rape, and Kick questionnaire    Fear of Current or Ex-Partner: No    Emotionally Abused: No    Physically Abused: No    Sexually Abused: No    Review of Systems     Objective:   Physical Exam Vitals reviewed.  Constitutional:      General: He is not in acute distress.    Appearance: Normal appearance. He is not ill-appearing or toxic-appearing.  Eyes:     General: Lids are normal. No visual field deficit.       Right eye: No discharge.        Left eye: No discharge.     Extraocular Movements: Extraocular movements intact.     Right eye: Normal extraocular motion and no nystagmus.     Left eye: Normal extraocular motion and no nystagmus.     Conjunctiva/sclera: Conjunctivae normal.     Right eye: Right conjunctiva is not injected. No chemosis or exudate.    Left eye: Left conjunctiva is not injected. No chemosis or exudate.    Pupils: Pupils are equal, round, and reactive to light.  Cardiovascular:     Rate and Rhythm: Regular rhythm. Tachycardia present.     Pulses:          Dorsalis pedis pulses are 2+ on the right side.       Posterior tibial pulses are 2+ on the right side.     Heart sounds: No murmur heard.    No gallop.  Pulmonary:     Effort: Pulmonary effort is normal. No respiratory distress.     Breath sounds: Normal breath sounds. No wheezing, rhonchi or rales.   Abdominal:     General: Abdomen is flat.     Palpations: Abdomen is soft.  Musculoskeletal:     Right lower leg: No edema.     Left lower leg: No edema.     Right foot: Normal range of motion. No deformity or bunion.  Feet:     Right foot:  Skin integrity: Skin integrity normal. No ulcer, blister, skin breakdown, erythema, warmth or callus.  Neurological:     General: No focal deficit present.     Mental Status: He is alert and oriented to person, place, and time.     Cranial Nerves: No cranial nerve deficit.     Sensory: No sensory deficit.     Motor: No weakness.     Coordination: Coordination normal.     Gait: Gait normal.       Assessment & Plan:  Atrial fibrillation, unspecified type ([place]) - Plan: EKG 12-Lead Patient is back in atrial fibrillation most likely due to noncompliance with his flecainide.  I encouraged the patient to resume flecainide twice daily however I believe that we need to get better rate control to avoid ventricular strain.  Therefore [place] start the patient on metoprolol XL 25 mg daily.  If his heart rate falls back between 60 and 90, no further adjustments are necessary.  However if he spontaneously converts back to sinus rhythm and his heart rate drops into the 40s I will discontinue metoprolol.  If patient develops chest pain shortness of breath or syncope or near syncope he is instructed to go to the emergency room immediately.  Recheck blood pressure in 48 hours along with heart rate

## 2022-01-23 ENCOUNTER — Telehealth: Payer: Self-pay

## 2022-01-23 NOTE — Telephone Encounter (Signed)
Pt states he took Metoprolol yesterday afternoon at 1 pm and BP has dropped to 91/62 and HR 44 this afternoon. I advised pt to hold medication until I spoke with you. Also advised pt if he has chest pain, dizziness, blurred vision, feeling faint or any other unusual changes to go to ER. Pt verbalized understanding of all.

## 2022-01-29 ENCOUNTER — Other Ambulatory Visit: Payer: Self-pay | Admitting: Family Medicine

## 2022-01-30 NOTE — Telephone Encounter (Signed)
Requested medication (s) are due for refill today:yes  Requested medication (s) are on the active medication list:yes  Last refill:  08/13/21  Future visit scheduled: yes  Notes to clinic:  Unable to refill per protocol, cannot delegate.      Requested Prescriptions  Pending Prescriptions Disp Refills   traMADol (ULTRAM) 50 MG tablet [Pharmacy Med Name: TRAMADOL '50MG'$  TABLETS] 120 tablet     Sig: TAKE 1 TABLET BY MOUTH EVERY 6 HOURS AS NEEDED.     Not Delegated - Analgesics:  Opioid Agonists Failed - 01/29/2022 10:25 PM      Failed - This refill cannot be delegated      Failed - Urine Drug Screen completed in last 360 days      Failed - Valid encounter within last 3 months    Recent Outpatient Visits           5 months ago Atrial fibrillation, unspecified type ([place])   [place] [name], Cammie Mcgee, MD   1 year ago Right sided sciatica   [place] Susy Frizzle, MD   1 year ago Atrial fibrillation, unspecified type [facility])   [place] Susy Frizzle, MD   2 years ago [place] vision, bilateral   [place] [name], Cammie Mcgee, MD   3 years ago Routine general medical examination at a health care facility   [place], Cammie Mcgee, MD       Future Appointments             In 3 months [name], Cammie Mcgee, MD [place]

## 2022-03-06 DIAGNOSIS — R8271 Bacteriuria: Secondary | ICD-10-CM | POA: Diagnosis not present

## 2022-03-06 DIAGNOSIS — N200 Calculus of kidney: Secondary | ICD-10-CM | POA: Diagnosis not present

## 2022-03-11 ENCOUNTER — Encounter: Payer: Medicare Other | Admitting: Family Medicine

## 2022-03-11 ENCOUNTER — Telehealth: Payer: Self-pay | Admitting: Family Medicine

## 2022-03-11 NOTE — Telephone Encounter (Signed)
Called patient to complete AWV, no answer, left message

## 2022-04-17 DIAGNOSIS — R278 Other lack of coordination: Secondary | ICD-10-CM | POA: Diagnosis not present

## 2022-04-17 DIAGNOSIS — R351 Nocturia: Secondary | ICD-10-CM | POA: Diagnosis not present

## 2022-04-17 DIAGNOSIS — N393 Stress incontinence (female) (male): Secondary | ICD-10-CM | POA: Diagnosis not present

## 2022-04-22 ENCOUNTER — Other Ambulatory Visit: Payer: Self-pay | Admitting: Family Medicine

## 2022-04-22 DIAGNOSIS — I10 Essential (primary) hypertension: Secondary | ICD-10-CM

## 2022-04-22 DIAGNOSIS — D225 Melanocytic nevi of trunk: Secondary | ICD-10-CM | POA: Diagnosis not present

## 2022-04-22 DIAGNOSIS — L603 Nail dystrophy: Secondary | ICD-10-CM | POA: Diagnosis not present

## 2022-04-22 DIAGNOSIS — Z85828 Personal history of other malignant neoplasm of skin: Secondary | ICD-10-CM | POA: Diagnosis not present

## 2022-04-22 DIAGNOSIS — D224 Melanocytic nevi of scalp and neck: Secondary | ICD-10-CM | POA: Diagnosis not present

## 2022-04-22 DIAGNOSIS — D485 Neoplasm of uncertain behavior of skin: Secondary | ICD-10-CM | POA: Diagnosis not present

## 2022-04-22 NOTE — Telephone Encounter (Signed)
Requested Prescriptions  Pending Prescriptions Disp Refills   losartan (COZAAR) 50 MG tablet [Pharmacy Med Name: LOSARTAN '50MG'$  TABLETS] 90 tablet 0    Sig: TAKE 1 TABLET(50 MG) BY MOUTH DAILY     Cardiovascular:  Angiotensin Receptor Blockers Failed - 04/22/2022  3:56 AM      Failed - Cr in normal range and within 180 days    Creat  Date Value Ref Range Status  08/13/2021 1.06 0.70 - 1.28 mg/dL Final         Failed - K in normal range and within 180 days    Potassium  Date Value Ref Range Status  08/13/2021 4.4 3.5 - 5.3 mmol/L Final         Failed - Last BP in normal range    BP Readings from Last 1 Encounters:  01/22/22 (!) 132/92         Failed - Valid encounter within last 6 months    Recent Outpatient Visits           8 months ago Atrial fibrillation, unspecified type ([place])   [place] Susy Frizzle, MD   1 year ago Right sided sciatica   [place] Susy Frizzle, MD   1 year ago Atrial fibrillation, unspecified type [facility])   [place] Susy Frizzle, MD   2 years ago [place] vision, bilateral   [place] [name], Cammie Mcgee, MD   3 years ago Routine general medical examination at a health care facility   [place], Cammie Mcgee, MD       Future Appointments             In 4 weeks [name], Cammie Mcgee, MD [place], [place]   In 3 months Evans Lance, MD [place]. [facility], [facility]            Passed - Patient is not pregnant

## 2022-04-23 ENCOUNTER — Telehealth: Payer: Self-pay | Admitting: Family Medicine

## 2022-04-23 ENCOUNTER — Other Ambulatory Visit: Payer: Self-pay

## 2022-04-23 DIAGNOSIS — I4891 Unspecified atrial fibrillation: Secondary | ICD-10-CM

## 2022-04-23 MED ORDER — RIVAROXABAN 20 MG PO TABS: 20.0000 mg | ORAL_TABLET | Freq: Every day | ORAL | 1 refills | Status: DC

## 2022-04-23 MED ORDER — FLECAINIDE ACETATE 150 MG PO TABS: 150.0000 mg | ORAL_TABLET | Freq: Two times a day (BID) | ORAL | 1 refills | Status: DC

## 2022-04-23 NOTE — Telephone Encounter (Signed)
Patient called to request for his preferred pharmacy to be changed to   [facility] [place], [place] [address] Phone: [phone]  Needs a refill of   Medication losartan (COZAAR) 50 MG tablet [[id number]] losartan (COZAAR) 50 MG tablet [[id number]]     Requesting for this script and all scripts moving forward to be sent to [facility].  Please advise patient at [phone].

## 2022-04-23 NOTE — Addendum Note (Signed)
Addended by: Derrel Nip B on: 04/23/2022 10:08 AM   Modules accepted: Orders

## 2022-04-23 NOTE — Telephone Encounter (Signed)
Pt is requesting a 90 day supply of Xarelto. Please address

## 2022-04-23 NOTE — Telephone Encounter (Signed)
Xarelto '20mg'$  refill request received. Pt is 77 years old, weight-97.1kg, Crea-1.06 on 08/13/2021, last seen by Tommye Standard on 08/10/21, Diagnosis-Afib, CrCl- 78.88 mL/min; Dose is appropriate based on dosing criteria. Will send in refill to requested pharmacy.

## 2022-04-24 ENCOUNTER — Other Ambulatory Visit: Payer: Self-pay

## 2022-04-24 DIAGNOSIS — I10 Essential (primary) hypertension: Secondary | ICD-10-CM

## 2022-04-24 MED ORDER — LOSARTAN POTASSIUM 50 MG PO TABS: ORAL_TABLET | ORAL | 1 refills | Status: DC

## 2022-05-17 ENCOUNTER — Telehealth: Payer: Self-pay | Admitting: Internal Medicine

## 2022-05-17 NOTE — Telephone Encounter (Signed)
Patient c/o Palpitations:  High priority if patient c/o lightheadedness, shortness of breath, or chest pain  How long have you had palpitations/irregular HR/ Afib? Has been in a-fib about 3-4 days. Are you having the symptoms now? yes  Are you currently experiencing lightheadedness, SOB or CP? No   Do you have a history of afib (atrial fibrillation) or irregular heart rhythm? Yes   Have you checked your BP or HR? (document readings if available): HR 93, BP 128/93, states he HR went up to 115.   Are you experiencing any other symptoms? Weakness, not a lot of energy.   Patient wants to know if he should take another dosage of flecainide or change anything.  Patient states he hasn't taken his atorvastatin for 5 days he is wondering if that has to do anything with this.

## 2022-05-17 NOTE — Telephone Encounter (Signed)
Spoke with pt who states he has been in Afib for 3-4 days.  Pt states he usually has 2 to 3 exacerbations yearly that last for a day or two.  Pt denies current CP, SOB or dizziness.  Current HR is 93.  He is taking medications as prescribed.  He tried taking an extra Metoprolol when his HR was 115 but it lowered his BP too much.  It has since rebounded and current BP 128/93.  Contacted Afib clinic and they have no appointments today.  Appointment scheduled with Christen Bame on 05/20/2022 at 220pm.  Reviewed ED precautions.  Pt verbalizes understanding and agrees with current plan.

## 2022-05-18 NOTE — Progress Notes (Unsigned)
Cardiology Office Note:    Date:  05/20/2022   ID:  [name] [name], DOB 24-Jan-1946, MRN [id number]  PCP:  Susy Frizzle, MD   [facility] Providers Cardiologist:  None Electrophysiologist:  Cristopher Peru, MD     Referring MD: Susy Frizzle, MD   Chief Complaint: atrial fib exacerbation  History of Present Illness:    [name] [name] is a very pleasant 77 y.o. male with a hx of stroke, HTN, HLD, PAF, sinus node dysfunction, and chronic anticoagulation.   Atrial fibrillation diagnosed in 2009 and he was started on flecainide in 2011. He has a history of rare episodes of a fib for which he has been maintained on flecainide.  Last cardiology clinic visit was 08/10/2021 with Tommye Standard, PA. Reported rare episodes of A-fib. 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 No changes were made to treatment regimen and he was advised to return in 1 year for follow-up.  Per record review, on 01/22/2022 he reported to Dr. Dennard Schaumann, PCP, that he takes flecainide inconsistently at only 1 dose daily.  He was feeling his heart beating irregularly and EKG at office visit revealed atrial fibrillation with HR 110 to 125 bpm.  He was advised to resume flecainide twice daily.  He was given metoprolol succinate 25 mg daily with recommendation to monitor for HR < 60 bpm.   He contacted our office 05/17/2022 with report of being in A-fib for 3 to 4 days.  He reports he usually has 2-3 exacerbations a year that last for a day or 2.  Current HR 93 bpm.  He tried taking an extra metoprolol when his HR was 115 but it lowered his BP too much. Appointment for that day was attempted, but no availability. ER precautions reviewed.  Today, he is here for follow-up a a fib exacerbation. He woke up 2 mornings ago and felt like he was back in sinus rhythm. Had felt more tired, elevated HR for 3 to 4 days prior. We discussed potential triggers, he asks if the materials he is frequently exposed to like paint and Laquer could be  contributing. Rarely drinks caffeine, no alcohol. Wife says he snores, but has history of deviated septum and snoring has improved with breath right strips. Symptom of a fib is that he does not feel as energetic. He can check his pulse manually and feel if it is irregular. Has occasional LE edema that resolves overnight. He denies chest pain, shortness of breath, fatigue, palpitations, melena, hematuria, hemoptysis, diaphoresis, weakness, presyncope, syncope, orthopnea, and PND. He reports historically, he has only taken metoprolol on an as-needed basis.  Past Medical History:  Diagnosis Date   CVA (cerebral infarction) 07-14-09   Dysrhythmia    History of kidney stones    Hyperlipidemia    Hypertension    Nephrolithiasis    Paroxysmal atrial fibrillation (HCC)    Paroxysmal atrial flutter ([place])    a. s/p ablation 2012.   Prostate cancer [facility])    Stroke [facility])     Past Surgical History:  Procedure Laterality Date   APPENDECTOMY     BACK SURGERY     CYSTOSCOPY/URETEROSCOPY/HOLMIUM LASER/STENT PLACEMENT Left 09/19/2020   Procedure: CYSTOSCOPY RETROGRADE PYELOGRAM URETEROSCOPY/HOLMIUM LASER/STENT PLACEMENT;  Surgeon: Irine Seal, MD;  Location: [facility];  Service: Urology;  Laterality: Left;   EYE SURGERY     PROSTATECTOMY  2005    Current Medications: Current Meds  Medication Sig   atorvastatin (LIPITOR) 20 MG tablet TAKE  1 TABLET(20 MG) BY MOUTH DAILY   diltiazem (CARDIZEM) 30 MG tablet Take 1 tablet (30 mg total) by mouth 4 (four) times daily as needed (heart ratew greater than 100).   flecainide (TAMBOCOR) 150 MG tablet Take 1 tablet (150 mg total) by mouth 2 (two) times daily.   losartan (COZAAR) 50 MG tablet TAKE 1 TABLET(50 MG) BY MOUTH DAILY   rivaroxaban (XARELTO) 20 MG TABS tablet Take 1 tablet (20 mg total) by mouth daily with supper.   traMADol (ULTRAM) 50 MG tablet TAKE 1 TABLET BY MOUTH EVERY 6 HOURS AS NEEDED.     Allergies:   Patient has no known allergies.   Social  History   Socioeconomic History   Marital status: Married    Spouse name: Not on file   Number of children: 2   Years of education: Not on file   Highest education level: Not on file  Occupational History   Occupation: Surveyor, quantity: [employer]  Tobacco Use   Smoking status: Never   Smokeless tobacco: Never  Vaping Use   Vaping Use: Never used  Substance and Sexual Activity   Alcohol use: No   Drug use: No   Sexual activity: Not on file  Other Topics Concern   Not on file  Social History Narrative   Not on file   Social Determinants of Health   Financial Resource Strain: Low Risk  (03/08/2021)   Overall Financial Resource Strain (CARDIA)    Difficulty of Paying Living Expenses: Not hard at all  Food Insecurity: No Food Insecurity (03/08/2021)   Hunger Vital Sign    Worried About Running Out of Food in the Last Year: Never true    [place] in the Last Year: Never true  Transportation Needs: No Transportation Needs (03/08/2021)   PRAPARE - Hydrologist (Medical): No    Lack of Transportation (Non-Medical): No  Physical Activity: Insufficiently Active (03/08/2021)   Exercise Vital Sign    Days of Exercise per Week: 3 days    Minutes of Exercise per Session: 20 min  Stress: No Stress Concern Present (03/08/2021)   [place]    Feeling of Stress : Not at all  Social Connections: [place] (03/08/2021)   Social Connection and Isolation Panel [NHANES]    Frequency of Communication with Friends and Family: More than three times a week    Frequency of Social Gatherings with Friends and Family: More than three times a week    Attends Religious Services: More than 4 times per year    Active Member of [employer] or Organizations: Yes    Attends Music therapist: More than 4 times per year    Marital Status: Married     Family History: The  patient's family history includes Heart disease (age of onset: 55) in his mother; Prostate cancer in his father; Testicular cancer in his brother.  ROS:   Please see the history of present illness.   All other systems reviewed and are negative.  Labs/Other Studies Reviewed:    The following studies were reviewed today:   Recent Labs: 08/13/2021: ALT 21; BUN 13; Creat 1.06; Potassium 4.4; Sodium 142 05/20/2022: Hemoglobin 14.5; Platelets 206  Recent Lipid Panel    Component Value Date/Time   CHOL 107 08/13/2021 0824   CHOL 113 03/02/2018 0824   TRIG 54 08/13/2021 0824   HDL 30 (  L) 08/13/2021 0824   HDL 28 (L) 03/02/2018 0824   CHOLHDL 3.6 08/13/2021 0824   VLDL 28 07/29/2016 0837   LDLCALC 64 08/13/2021 0824     Risk Assessment/Calculations:    CHA2DS2-VASc Score = 5  {This indicates a 7.2% annual risk of stroke. The patient's score is based upon: CHF History: 0 HTN History: 1 Diabetes History: 0 Stroke History: 2 Vascular Disease History: 0 Age Score: 2 Gender Score: 0      Physical Exam:    VS:  BP 110/80   Pulse (!) 58   Ht 5' 10"$  (1.778 m)   Wt 219 lb (99.3 kg)   SpO2 98%   BMI 31.42 kg/m     Wt Readings from Last 3 Encounters:  05/20/22 219 lb (99.3 kg)  05/20/22 218 lb 9.6 oz (99.2 kg)  01/22/22 214 lb (97.1 kg)     GEN:  Well nourished, well developed in no acute distress HEENT: Normal NECK: No JVD; No carotid bruits CARDIAC: RRR, no murmurs, rubs, gallops RESPIRATORY:  Clear to auscultation without rales, wheezing or rhonchi  ABDOMEN: Soft, non-tender, non-distended MUSCULOSKELETAL:  No edema; No deformity. 2+ pedal pulses, equal bilaterally SKIN: Warm and dry NEUROLOGIC:  Alert and oriented x 3 PSYCHIATRIC:  Normal affect   EKG:  EKG is ordered today.  The ekg ordered today demonstrates sinus bradycardia at 58 bpm with first-degree AV block, PR interval 278 ms, nonspecific IVCD, no acute change from previous   Diagnoses:    1. Paroxysmal  atrial fibrillation (HCC)   2. High risk medication use   3. Chronic anticoagulation   4. Hyperlipidemia LDL goal <100   5. Essential hypertension    Assessment and Plan:     PAF on chronic anticoagulation: 3 to 4 days of continuous A-fib with HR no higher than 115 bpm to his awareness. Woke up 2 morning ago, and was restored to sinus rhythm. Took metoprolol and it drastically lowered BP.  Saw PCP today and he discontinued metoprolol succinate 25 mg and gave him diltiazem 30 mg to use up to 4 times daily as needed for HR > 100 bpm.  No presyncope, syncope. Advised him to notify us if HR is less than 50 bpm. No bleeding problems on Xarelto. Continue Xarelto 20 mg daily which is appropriate dose for creatinine clearance.  Sinus node dysfunction: Has been advised in the past to only use metoprolol as needed.  He has also been given diltiazem 30 mg to use as needed as he had significant decrease in BP on metoprolol.  High risk medication use: He continues to take flecainide 150 mg twice daily. EKG reveals sinus brady at 58 bpm. QTc stable on EKG.   Hyperlipidemia LDL goal < 100: LDL 64 on 08/13/21. Continue atorvastatin. Managed by PCP. Encouraged 150 minutes moderate intensity exercise each week.   Hypertension: BP is well controlled.      Disposition:  Keep your May appointment with Dr. Lovena Le  Medication Adjustments/Labs and Tests Ordered: Current medicines are reviewed at length with the patient today.  Concerns regarding medicines are outlined above.  Orders Placed This Encounter  Procedures   EKG 12-Lead   No orders of the defined types were placed in this encounter.   Patient Instructions  Medication Instructions:  Your physician recommends that you continue on your current medications as directed. Please refer to the Current Medication list given to you today.  *If you need a refill on your cardiac medications before your  next appointment, please call your pharmacy*   Lab  Work: None Ordered  If you have labs (blood work) drawn today and your tests are completely normal, you will receive your results only by: [place] (if you have MyChart) OR A paper copy in the mail If you have any lab test that is abnormal or we need to change your treatment, we will call you to review the results.   Testing/Procedures: None Ordered   Follow-Up: At [facility], you and your health needs are our priority.  As part of our continuing mission to provide you with exceptional heart care, we have created designated Provider Care Teams.  These Care Teams include your primary Cardiologist (physician) and Advanced Practice Providers (APPs -  Physician Assistants and Nurse Practitioners) who all work together to provide you with the care you need, when you need it.  We recommend signing up for the patient portal called "MyChart".  Sign up information is provided on this After Visit Summary.  MyChart is used to connect with patients for Virtual Visits (Telemedicine).  Patients are able to view lab/test results, encounter notes, upcoming appointments, etc.  Non-urgent messages can be sent to your provider as well.   To learn more about what you can do with MyChart, go to [email or web address].    Your next appointment:   3 month(s)  Provider:   Dr. Lovena Le     Other Instructions Adopting a Healthy Lifestyle.   Weight: Know what a healthy weight is for you (roughly BMI <25) and aim to maintain this. You can calculate your body mass index on your smart phone  Diet: Aim for 7+ servings of fruits and vegetables daily Limit animal fats in diet for cholesterol and heart health - choose grass fed whenever available Avoid highly processed foods (fast food burgers, tacos, fried chicken, pizza, hot dogs, french fries)  Saturated fat comes in the form of butter, lard, coconut oil, margarine, partially hydrogenated oils, and fat in meat. These increase your risk of  cardiovascular disease.  Use healthy plant oils, such as olive, canola, soy, corn, sunflower and peanut.  Whole foods such as fruits, vegetables and whole grains have fiber  Men need > 38 grams of fiber per day Women need > 25 grams of fiber per day  Load up on vegetables and fruits - one-half of your plate: Aim for color and variety, and remember that potatoes dont count. Go for whole grains - one-quarter of your plate: Whole wheat, barley, wheat berries, quinoa, oats, brown rice, and foods made with them. If you want pasta, go with whole wheat pasta. Protein power - one-quarter of your plate: Fish, chicken, beans, and nuts are all healthy, versatile protein sources. Limit red meat. You need carbohydrates for energy! The type of carbohydrate is more important than the amount. Choose carbohydrates such as vegetables, fruits, whole grains, beans, and nuts in the place of white rice, white pasta, potatoes (baked or fried), macaroni and cheese, cakes, cookies, and donuts.  If youre thirsty, drink water. Coffee and tea are good in moderation, but skip sugary drinks and limit milk and dairy products to one or two daily servings. Keep sugar intake at 6 teaspoons or 24 grams or LESS       Exercise: Aim for 150 min of moderate intensity exercise weekly for heart health, and weights twice weekly for bone health Stay active - any steps are better than no steps! Aim for 7-9 hours of sleep daily  Signed, Emmaline Life, NP  05/20/2022 4:35 PM    [place]

## 2022-05-20 ENCOUNTER — Other Ambulatory Visit: Payer: Self-pay

## 2022-05-20 ENCOUNTER — Encounter: Payer: Self-pay | Admitting: Nurse Practitioner

## 2022-05-20 ENCOUNTER — Ambulatory Visit (INDEPENDENT_AMBULATORY_CARE_PROVIDER_SITE_OTHER): Payer: Medicare Other | Admitting: Family Medicine

## 2022-05-20 ENCOUNTER — Ambulatory Visit: Payer: Medicare Other | Attending: Nurse Practitioner | Admitting: Nurse Practitioner

## 2022-05-20 VITALS — BP 110/80 | HR 58 | Ht 70.0 in | Wt 219.0 lb

## 2022-05-20 VITALS — BP 120/68 | HR 55 | Temp 98.4°F | Ht 70.0 in | Wt 218.6 lb

## 2022-05-20 DIAGNOSIS — Z136 Encounter for screening for cardiovascular disorders: Secondary | ICD-10-CM | POA: Diagnosis not present

## 2022-05-20 DIAGNOSIS — E7800 Pure hypercholesterolemia, unspecified: Secondary | ICD-10-CM

## 2022-05-20 DIAGNOSIS — Z7901 Long term (current) use of anticoagulants: Secondary | ICD-10-CM

## 2022-05-20 DIAGNOSIS — E785 Hyperlipidemia, unspecified: Secondary | ICD-10-CM

## 2022-05-20 DIAGNOSIS — I4891 Unspecified atrial fibrillation: Secondary | ICD-10-CM | POA: Diagnosis not present

## 2022-05-20 DIAGNOSIS — I10 Essential (primary) hypertension: Secondary | ICD-10-CM | POA: Diagnosis not present

## 2022-05-20 DIAGNOSIS — Z79899 Other long term (current) drug therapy: Secondary | ICD-10-CM

## 2022-05-20 DIAGNOSIS — I480 Paroxysmal atrial fibrillation: Secondary | ICD-10-CM

## 2022-05-20 MED ORDER — ATORVASTATIN CALCIUM 20 MG PO TABS: ORAL_TABLET | ORAL | 3 refills | Status: DC

## 2022-05-20 MED ORDER — DILTIAZEM HCL 30 MG PO TABS: 30.0000 mg | ORAL_TABLET | Freq: Four times a day (QID) | ORAL | 0 refills | Status: DC | PRN

## 2022-05-20 NOTE — Progress Notes (Signed)
Subjective:    Patient ID: [name] [name], male    DOB: 24-Oct-1945, 77 y.o.   MRN: [id number]  HPI Patient recently had an episode of atrial fibrillation.  His heart rate was in the 130 range.  He took Toprol-XL.  He seldom takes that.  However this dropped his heart rate to the 30s.  He felt extremely lightheaded.  His blood pressure also remained low.  He states that he has an attack of atrial fibrillation perhaps once a year that usually last less than a day.  He denies chest pain shortness of breath or dyspnea on exertion.  He does have some constipation.  He takes Toprol once every 4 to 5 days for back pain.  He is not taking any stool softener. Past Medical History:  Diagnosis Date   CVA (cerebral infarction) 07-14-09   Dysrhythmia    History of kidney stones    Hyperlipidemia    Hypertension    Nephrolithiasis    Paroxysmal atrial fibrillation (HCC)    Paroxysmal atrial flutter ([place])    a. s/p ablation 2012.   Prostate cancer [facility])    Stroke [facility])    Past Surgical History:  Procedure Laterality Date   APPENDECTOMY     BACK SURGERY     CYSTOSCOPY/URETEROSCOPY/HOLMIUM LASER/STENT PLACEMENT Left 09/19/2020   Procedure: CYSTOSCOPY RETROGRADE PYELOGRAM URETEROSCOPY/HOLMIUM LASER/STENT PLACEMENT;  Surgeon: Irine Seal, MD;  Location: [facility];  Service: Urology;  Laterality: Left;   EYE SURGERY     PROSTATECTOMY  2005   Current Outpatient Medications on File Prior to Visit  Medication Sig Dispense Refill   atorvastatin (LIPITOR) 20 MG tablet TAKE 1 TABLET(20 MG) BY MOUTH DAILY 90 tablet 3   flecainide (TAMBOCOR) 150 MG tablet Take 1 tablet (150 mg total) by mouth 2 (two) times daily. 180 tablet 1   losartan (COZAAR) 50 MG tablet TAKE 1 TABLET(50 MG) BY MOUTH DAILY 90 tablet 1   metoprolol succinate (TOPROL-XL) 25 MG 24 hr tablet Take 1 tablet (25 mg total) by mouth daily. 90 tablet 3   rivaroxaban (XARELTO) 20 MG TABS tablet Take 1 tablet (20 mg total) by mouth daily with supper. 90  tablet 1   traMADol (ULTRAM) 50 MG tablet TAKE 1 TABLET BY MOUTH EVERY 6 HOURS AS NEEDED. 120 tablet 0   No current facility-administered medications on file prior to visit.     No Known Allergies Social History   Socioeconomic History   Marital status: Married    Spouse name: Not on file   Number of children: 2   Years of education: Not on file   Highest education level: Not on file  Occupational History   Occupation: Surveyor, quantity: [employer]  Tobacco Use   Smoking status: Never   Smokeless tobacco: Never  Vaping Use   Vaping Use: Never used  Substance and Sexual Activity   Alcohol use: No   Drug use: No   Sexual activity: Not on file  Other Topics Concern   Not on file  Social History Narrative   Not on file   Social Determinants of Health   Financial Resource Strain: Low Risk  (03/08/2021)   Overall Financial Resource Strain (CARDIA)    Difficulty of Paying Living Expenses: Not hard at all  Food Insecurity: No Food Insecurity (03/08/2021)   Hunger Vital Sign    Worried About Running Out of Food in the Last Year: Never true    Ran Out of  Food in the Last Year: Never true  Transportation Needs: No Transportation Needs (03/08/2021)   PRAPARE - Hydrologist (Medical): No    Lack of Transportation (Non-Medical): No  Physical Activity: Insufficiently Active (03/08/2021)   Exercise Vital Sign    Days of Exercise per Week: 3 days    Minutes of Exercise per Session: 20 min  Stress: No Stress Concern Present (03/08/2021)   [place]    Feeling of Stress : Not at all  Social Connections: [place] (03/08/2021)   Social Connection and Isolation Panel [NHANES]    Frequency of Communication with Friends and Family: More than three times a week    Frequency of Social Gatherings with Friends and Family: More than three times a week    Attends Religious  Services: More than 4 times per year    Active Member of [employer] or Organizations: Yes    Attends Archivist Meetings: More than 4 times per year    Marital Status: Married  Human resources officer Violence: Not At Risk (03/08/2021)   Humiliation, Afraid, Rape, and Kick questionnaire    Fear of Current or Ex-Partner: No    Emotionally Abused: No    Physically Abused: No    Sexually Abused: No    Review of Systems     Objective:   Physical Exam Vitals reviewed.  Constitutional:      General: He is not in acute distress.    Appearance: Normal appearance. He is not ill-appearing or toxic-appearing.  Eyes:     General: Lids are normal. No visual field deficit.       Right eye: No discharge.        Left eye: No discharge.     Extraocular Movements: Extraocular movements intact.     Right eye: Normal extraocular motion and no nystagmus.     Left eye: Normal extraocular motion and no nystagmus.     Conjunctiva/sclera: Conjunctivae normal.     Right eye: Right conjunctiva is not injected. No chemosis or exudate.    Left eye: Left conjunctiva is not injected. No chemosis or exudate.    Pupils: Pupils are equal, round, and reactive to light.  Cardiovascular:     Rate and Rhythm: Normal rate and regular rhythm.     Pulses:          Dorsalis pedis pulses are 2+ on the right side.       Posterior tibial pulses are 2+ on the right side.     Heart sounds: No murmur heard.    No gallop.  Pulmonary:     Effort: Pulmonary effort is normal. No respiratory distress.     Breath sounds: Normal breath sounds. No wheezing, rhonchi or rales.  Abdominal:     General: Abdomen is flat.     Palpations: Abdomen is soft.  Musculoskeletal:     Right lower leg: No edema.     Left lower leg: No edema.     Right foot: Normal range of motion. No deformity or bunion.  Feet:     Right foot:     Skin integrity: Skin integrity normal. No ulcer, blister, skin breakdown, erythema, warmth or callus.   Neurological:     General: No focal deficit present.     Mental Status: He is alert and oriented to person, place, and time.     Cranial Nerves: No cranial nerve deficit.  Sensory: No sensory deficit.     Motor: No weakness.     Coordination: Coordination normal.     Gait: Gait normal.      Assessment & Plan:  Atrial fibrillation, unspecified type ([place]) - Plan: CBC with Differential/Platelet, Lipid panel, COMPLETE METABOLIC PANEL WITH GFR, TSH  Benign essential HTN Patient is in normal sinus rhythm today.  I recommended he try Cardizem 30 mg every 6 hours as needed for atrial fibrillation.  Perhaps this will not affect his heart rate and his blood pressure is dramatically as metoprolol.  He will discontinue the Toprol.  He was only using this once a year perhaps.  I had MiraLAX for constipation.  Check CBC CMP lipid panel and TSH.  Blood pressure today is well-controlled.

## 2022-05-20 NOTE — Patient Instructions (Signed)
Medication Instructions:  Your physician recommends that you continue on your current medications as directed. Please refer to the Current Medication list given to you today.  *If you need a refill on your cardiac medications before your next appointment, please call your pharmacy*   Lab Work: None Ordered  If you have labs (blood work) drawn today and your tests are completely normal, you will receive your results only by: [place] (if you have MyChart) OR A paper copy in the mail If you have any lab test that is abnormal or we need to change your treatment, we will call you to review the results.   Testing/Procedures: None Ordered   Follow-Up: At [facility], you and your health needs are our priority.  As part of our continuing mission to provide you with exceptional heart care, we have created designated Provider Care Teams.  These Care Teams include your primary Cardiologist (physician) and Advanced Practice Providers (APPs -  Physician Assistants and Nurse Practitioners) who all work together to provide you with the care you need, when you need it.  We recommend signing up for the patient portal called "MyChart".  Sign up information is provided on this After Visit Summary.  MyChart is used to connect with patients for Virtual Visits (Telemedicine).  Patients are able to view lab/test results, encounter notes, upcoming appointments, etc.  Non-urgent messages can be sent to your provider as well.   To learn more about what you can do with MyChart, go to [email or web address].    Your next appointment:   3 month(s)  Provider:   Dr. Lovena Le     Other Instructions Adopting a Healthy Lifestyle.   Weight: Know what a healthy weight is for you (roughly BMI <25) and aim to maintain this. You can calculate your body mass index on your smart phone  Diet: Aim for 7+ servings of fruits and vegetables daily Limit animal fats in diet for cholesterol and heart health -  choose grass fed whenever available Avoid highly processed foods (fast food burgers, tacos, fried chicken, pizza, hot dogs, french fries)  Saturated fat comes in the form of butter, lard, coconut oil, margarine, partially hydrogenated oils, and fat in meat. These increase your risk of cardiovascular disease.  Use healthy plant oils, such as olive, canola, soy, corn, sunflower and peanut.  Whole foods such as fruits, vegetables and whole grains have fiber  Men need > 38 grams of fiber per day Women need > 25 grams of fiber per day  Load up on vegetables and fruits - one-half of your plate: Aim for color and variety, and remember that potatoes dont count. Go for whole grains - one-quarter of your plate: Whole wheat, barley, wheat berries, quinoa, oats, brown rice, and foods made with them. If you want pasta, go with whole wheat pasta. Protein power - one-quarter of your plate: Fish, chicken, beans, and nuts are all healthy, versatile protein sources. Limit red meat. You need carbohydrates for energy! The type of carbohydrate is more important than the amount. Choose carbohydrates such as vegetables, fruits, whole grains, beans, and nuts in the place of white rice, white pasta, potatoes (baked or fried), macaroni and cheese, cakes, cookies, and donuts.  If youre thirsty, drink water. Coffee and tea are good in moderation, but skip sugary drinks and limit milk and dairy products to one or two daily servings. Keep sugar intake at 6 teaspoons or 24 grams or LESS       Exercise: Aim  for 150 min of moderate intensity exercise weekly for heart health, and weights twice weekly for bone health Stay active - any steps are better than no steps! Aim for 7-9 hours of sleep daily

## 2022-05-21 LAB — LIPID PANEL
Cholesterol: 140 mg/dL (ref <200)
HDL: 29 mg/dL — ABNORMAL LOW (ref >=40)
LDL Cholesterol (Calc): 90 mg/dL (calc)
Non-HDL Cholesterol (Calc): 111 mg/dL (calc) (ref <130)
Total CHOL/HDL Ratio: 4.8 (calc) (ref <5.0)
Triglycerides: 117 mg/dL (ref <150)

## 2022-05-21 LAB — COMPLETE METABOLIC PANEL WITH GFR
AG Ratio: 1.6 (calc) (ref 1.0–2.5)
ALT: 31 U/L (ref 9–46)
AST: 21 U/L (ref 10–35)
Albumin: 4 g/dL (ref 3.6–5.1)
Alkaline phosphatase (APISO): 64 U/L (ref 35–144)
BUN: 17 mg/dL (ref 7–25)
CO2: 21 mmol/L (ref 20–32)
Calcium: 9 mg/dL (ref 8.6–10.3)
Chloride: 107 mmol/L (ref 98–110)
Creat: 1.05 mg/dL (ref 0.70–1.28)
Globulin: 2.5 g/dL (calc) (ref 1.9–3.7)
Glucose, Bld: 104 mg/dL — ABNORMAL HIGH (ref 65–99)
Potassium: 4.2 mmol/L (ref 3.5–5.3)
Sodium: 140 mmol/L (ref 135–146)
Total Bilirubin: 0.5 mg/dL (ref 0.2–1.2)
Total Protein: 6.5 g/dL (ref 6.1–8.1)
eGFR: 72 mL/min/{1.73_m2} (ref >=60)

## 2022-05-21 LAB — CBC WITH DIFFERENTIAL/PLATELET
Absolute Monocytes: 485 cells/uL (ref 200–950)
Basophils Absolute: 38 cells/uL (ref 0–200)
Basophils Relative: 0.8 %
Eosinophils Absolute: 163 cells/uL (ref 15–500)
Eosinophils Relative: 3.4 %
HCT: 43 % (ref 38.5–50.0)
Hemoglobin: 14.5 g/dL (ref 13.2–17.1)
Lymphs Abs: 1147 cells/uL (ref 850–3900)
MCH: 30.7 pg (ref 27.0–33.0)
MCHC: 33.7 g/dL (ref 32.0–36.0)
MCV: 90.9 fL (ref 80.0–100.0)
MPV: 10.1 fL (ref 7.5–12.5)
Monocytes Relative: 10.1 %
Neutro Abs: 2966 cells/uL (ref 1500–7800)
Neutrophils Relative %: 61.8 %
Platelets: 206 10*3/uL (ref 140–400)
RBC: 4.73 10*6/uL (ref 4.20–5.80)
RDW: 12.1 % (ref 11.0–15.0)
Total Lymphocyte: 23.9 %
WBC: 4.8 10*3/uL (ref 3.8–10.8)

## 2022-05-21 LAB — TSH: TSH: 1.89 mIU/L (ref 0.40–4.50)

## 2022-07-24 ENCOUNTER — Other Ambulatory Visit: Payer: Self-pay | Admitting: Family Medicine

## 2022-07-24 DIAGNOSIS — I10 Essential (primary) hypertension: Secondary | ICD-10-CM

## 2022-07-29 ENCOUNTER — Other Ambulatory Visit: Payer: Self-pay

## 2022-07-29 DIAGNOSIS — Z85828 Personal history of other malignant neoplasm of skin: Secondary | ICD-10-CM | POA: Diagnosis not present

## 2022-07-29 DIAGNOSIS — I4891 Unspecified atrial fibrillation: Secondary | ICD-10-CM

## 2022-07-29 DIAGNOSIS — D485 Neoplasm of uncertain behavior of skin: Secondary | ICD-10-CM | POA: Diagnosis not present

## 2022-07-29 DIAGNOSIS — L570 Actinic keratosis: Secondary | ICD-10-CM | POA: Diagnosis not present

## 2022-07-29 DIAGNOSIS — D229 Melanocytic nevi, unspecified: Secondary | ICD-10-CM | POA: Diagnosis not present

## 2022-07-29 DIAGNOSIS — D225 Melanocytic nevi of trunk: Secondary | ICD-10-CM | POA: Diagnosis not present

## 2022-07-29 MED ORDER — RIVAROXABAN 20 MG PO TABS: 20.0000 mg | ORAL_TABLET | Freq: Every day | ORAL | 1 refills | Status: DC

## 2022-07-29 NOTE — Telephone Encounter (Signed)
Received faxed Xarelto refill request.  Pt last saw Eligha Bridegroom, NP on 05/20/22, last labs 05/20/22 Creat 1.05, age 77, weight 99.3kg, CrCl 80.12, based on CrCl pt is on appropriate dosage of Xarelto  QD for afib.  Will refill rx.

## 2022-08-09 DIAGNOSIS — R310 Gross hematuria: Secondary | ICD-10-CM | POA: Diagnosis not present

## 2022-08-12 ENCOUNTER — Ambulatory Visit: Payer: Medicare Other | Attending: Internal Medicine | Admitting: Internal Medicine

## 2022-08-12 ENCOUNTER — Ambulatory Visit (INDEPENDENT_AMBULATORY_CARE_PROVIDER_SITE_OTHER): Payer: Medicare Other

## 2022-08-12 ENCOUNTER — Encounter: Payer: Self-pay | Admitting: Internal Medicine

## 2022-08-12 VITALS — BP 132/82 | HR 101 | Ht 70.0 in | Wt 218.0 lb

## 2022-08-12 DIAGNOSIS — I495 Sick sinus syndrome: Secondary | ICD-10-CM | POA: Diagnosis not present

## 2022-08-12 DIAGNOSIS — I10 Essential (primary) hypertension: Secondary | ICD-10-CM

## 2022-08-12 DIAGNOSIS — I4892 Unspecified atrial flutter: Secondary | ICD-10-CM

## 2022-08-12 DIAGNOSIS — I480 Paroxysmal atrial fibrillation: Secondary | ICD-10-CM

## 2022-08-12 MED ORDER — DILTIAZEM HCL ER COATED BEADS 120 MG PO CP24
120.0000 mg | ORAL_CAPSULE | Freq: Every day | ORAL | 3 refills | Status: AC
Start: 2022-08-12 — End: ?

## 2022-08-12 NOTE — Progress Notes (Unsigned)
Enrolled for [employer] to mail a ZIO XT long term holter monitor to the patients address on file.   To be delivered 08/14/22.

## 2022-08-12 NOTE — Patient Instructions (Addendum)
Medication Instructions:  Your physician has recommended you make the following change in your medication: STOP taking:  Flecainide today, 08/12/2022;    Start taking:  Cardizem 120 mg, by mouth daily You will-    Take 1 capsule (120 mg total) by mouth daily. Take with or after meal   Lab Work: None ordered.  If you have labs (blood work) drawn today and your tests are completely normal, you will receive your results only by: MyChart Message (if you have MyChart) OR A paper copy in the mail If you have any lab test that is abnormal or we need to change your treatment, we will call you to review the results.  Testing/Procedures: None ordered.  Follow-Up: Dr. Lewayne Bunting has ordered a 7 day Zio heart monitor; see instructions below.  Your next appointment:   1 year(s)  or After your Zio heart monitor results are available.   The format for your next appointment:   In Person  Provider:   Lewayne Bunting, MD{or one of the following Advanced Practice Providers on your designated Care Team:   Francis Dowse, [place] Casimiro Needle "Mardelle Matte" Tillery, PA-C  ZIO XT- Long Term Monitor Instructions  Your physician has requested you wear a ZIO patch monitor for 14 days.  This is a single patch monitor. [employer] supplies one patch monitor per enrollment. Additional stickers are not available. Please do not apply patch if you will be having a Nuclear Stress Test,  Echocardiogram, Cardiac CT, MRI, or Chest Xray during the period you would be wearing the  monitor. The patch cannot be worn during these tests. You cannot remove and re-apply the  ZIO XT patch monitor.  Your ZIO patch monitor will be mailed 3 day USPS to your address on file. It may take 3-5 days  to receive your monitor after you have been enrolled.  Once you have received your monitor, please review the enclosed instructions. Your monitor  has already been registered assigning a specific monitor serial # to you.  Billing and Patient Assistance  Program Information  We have supplied [employer] with any of your insurance information on file for billing purposes. [employer] offers a sliding scale Patient Assistance Program for patients that do not have  insurance, or whose insurance does not completely cover the cost of the ZIO monitor.  You must apply for the Patient Assistance Program to qualify for this discounted rate.  To apply, please call [employer] at [phone], select option 4, select option 2, ask to apply for  Patient Assistance Program. Meredeth Ide will ask your household income, and how many people  are in your household. They will quote your out-of-pocket cost based on that information.  [employer] will also be able to set up a 93-month, interest-free payment plan if needed.  Applying the monitor   Shave hair from upper left chest.  Hold abrader disc by orange tab. Rub abrader in 40 strokes over the upper left chest as  indicated in your monitor instructions.  Clean area with 4 enclosed alcohol pads. Let dry.  Apply patch as indicated in monitor instructions. Patch will be placed under collarbone on left  side of chest with arrow pointing upward.  Rub patch adhesive wings for 2 minutes. Remove white label marked "1". Remove the white  label marked "2". Rub patch adhesive wings for 2 additional minutes.  While looking in a mirror, press and release button in center of patch. A small green light will  flash 3-4 times. This will be your  only indicator that the monitor has been turned on.  Do not shower for the first 24 hours. You may shower after the first 24 hours.  Press the button if you feel a symptom. You will hear a small click. Record Date, Time and  Symptom in the Patient Logbook.  When you are ready to remove the patch, follow instructions on the last 2 pages of Patient  Logbook. Stick patch monitor onto the last page of Patient Logbook.  Place Patient Logbook in the blue and white box. Use locking tab on box and tape box  closed  securely. The blue and white box has prepaid postage on it. Please place it in the mailbox as  soon as possible. Your physician should have your test results approximately 7 days after the  monitor has been mailed back to [facility].  Call [facility] Customer Care at [phone] if you have questions regarding  your ZIO XT patch monitor. Call them immediately if you see an orange light blinking on your  monitor.  If your monitor falls off in less than 4 days, contact our Monitor department at [phone].  If your monitor becomes loose or falls off after 4 days call [employer] at [phone] for  suggestions on securing your monitor

## 2022-08-12 NOTE — Progress Notes (Signed)
HPI [name] [name] returns today for followup. He is a pleasant 77 yo man with PAF, now becoming persistent, who also has SND and conduction system disease. He had had worsening atrial fib, based on his HR's being elevated but not so much related to feeling worse or reduced exercise/work ability. The patient has not had syncope.  No Known Allergies   Current Outpatient Medications  Medication Sig Dispense Refill   atorvastatin (LIPITOR) 20 MG tablet TAKE 1 TABLET(20 MG) BY MOUTH DAILY 90 tablet 3   diltiazem (CARDIZEM) 30 MG tablet Take 1 tablet (30 mg total) by mouth 4 (four) times daily as needed (heart ratew greater than 100). 30 tablet 0   flecainide (TAMBOCOR) 150 MG tablet Take 1 tablet (150 mg total) by mouth 2 (two) times daily. 180 tablet 1   losartan (COZAAR) 50 MG tablet TAKE 1 TABLET(50 MG) BY MOUTH DAILY 90 tablet 1   rivaroxaban (XARELTO) 20 MG TABS tablet Take 1 tablet (20 mg total) by mouth daily with supper. 90 tablet 1   traMADol (ULTRAM) 50 MG tablet TAKE 1 TABLET BY MOUTH EVERY 6 HOURS AS NEEDED. 120 tablet 0   No current facility-administered medications for this visit.     Past Medical History:  Diagnosis Date   CVA (cerebral infarction) 07-14-09   Dysrhythmia    History of kidney stones    Hyperlipidemia    Hypertension    Nephrolithiasis    Paroxysmal atrial fibrillation (HCC)    Paroxysmal atrial flutter (HCC)    a. s/p ablation 2012.   Prostate cancer (HCC)    Stroke (HCC)     ROS:   All systems reviewed and negative except as noted in the HPI.   Past Surgical History:  Procedure Laterality Date   APPENDECTOMY     BACK SURGERY     CYSTOSCOPY/URETEROSCOPY/HOLMIUM LASER/STENT PLACEMENT Left 09/19/2020   Procedure: CYSTOSCOPY RETROGRADE PYELOGRAM URETEROSCOPY/HOLMIUM LASER/STENT PLACEMENT;  Surgeon: Bjorn Pippin, MD;  Location: [facility];  Service: Urology;  Laterality: Left;   EYE SURGERY     PROSTATECTOMY  2005     Family History  Problem  Relation Age of Onset   Heart disease Mother 98       died   Prostate cancer Father        alive   Testicular cancer Brother        died     Social History   Socioeconomic History   Marital status: Married    Spouse name: Not on file   Number of children: 2   Years of education: Not on file   Highest education level: Not on file  Occupational History   Occupation: Personnel officer: [employer]  Tobacco Use   Smoking status: Never   Smokeless tobacco: Never  Vaping Use   Vaping Use: Never used  Substance and Sexual Activity   Alcohol use: No   Drug use: No   Sexual activity: Not on file  Other Topics Concern   Not on file  Social History Narrative   Not on file   Social Determinants of Health   Financial Resource Strain: Low Risk  (03/08/2021)   Overall Financial Resource Strain (CARDIA)    Difficulty of Paying Living Expenses: Not hard at all  Food Insecurity: No Food Insecurity (03/08/2021)   Hunger Vital Sign    Worried About Running Out of Food in the Last Year: Never true    Ran Out  of Food in the Last Year: Never true  Transportation Needs: No Transportation Needs (03/08/2021)   PRAPARE - Administrator, Civil Service (Medical): No    Lack of Transportation (Non-Medical): No  Physical Activity: Insufficiently Active (03/08/2021)   Exercise Vital Sign    Days of Exercise per Week: 3 days    Minutes of Exercise per Session: 20 min  Stress: No Stress Concern Present (03/08/2021)   [employer] of Occupational Health - Occupational Stress Questionnaire    Feeling of Stress : Not at all  Social Connections: Socially Integrated (03/08/2021)   Social Connection and Isolation Panel [NHANES]    Frequency of Communication with Friends and Family: More than three times a week    Frequency of Social Gatherings with Friends and Family: More than three times a week    Attends Religious Services: More than 4 times per year    Active Member  of [employer] or Organizations: Yes    Attends Engineer, structural: More than 4 times per year    Marital Status: Married  Catering manager Violence: Not At Risk (03/08/2021)   Humiliation, Afraid, Rape, and Kick questionnaire    Fear of Current or Ex-Partner: No    Emotionally Abused: No    Physically Abused: No    Sexually Abused: No     BP 132/82   Pulse (!) 101   Ht 5\' 10"  (1.778 m)   Wt 218 lb (98.9 kg)   SpO2 97%   BMI 31.28 kg/m   Physical Exam:  Well appearing 77 yo man, NAD HEENT: Unremarkable Neck:  No JVD, no thyromegally Lymphatics:  No adenopathy Back:  No CVA tenderness Lungs:  Clear HEART:  IRegular rate rhythm, no murmurs, no rubs, no clicks Abd:  soft, positive bowel sounds, no organomegally, no rebound, no guarding Ext:  2 plus pulses, no edema, no cyanosis, no clubbing Skin:  No rashes no nodules Neuro:  CN II through XII intact, motor grossly intact  EKG - atrial fib with a CVR/RVR and IVCD  Assess/Plan: Persistent atrial fib - I have discussed the treatment options including amio, dofetilide and stopping the flecainide and pursuing rate control. The options were all reviewed. He will stop the flecainide and try rate control. We will add low dose cardizem. A 7 day zio will be worn. Additional recs will follow the zio results.  HTN - his bp is controlled today.  Coags - he will continue his Xarelto.   Sharlot Gowda Lyndall Bellot,MD

## 2022-08-16 DIAGNOSIS — I480 Paroxysmal atrial fibrillation: Secondary | ICD-10-CM | POA: Diagnosis not present

## 2022-08-16 DIAGNOSIS — I4892 Unspecified atrial flutter: Secondary | ICD-10-CM

## 2022-08-16 DIAGNOSIS — I10 Essential (primary) hypertension: Secondary | ICD-10-CM

## 2022-08-16 DIAGNOSIS — I495 Sick sinus syndrome: Secondary | ICD-10-CM | POA: Diagnosis not present

## 2022-08-21 ENCOUNTER — Encounter: Payer: Self-pay | Admitting: Internal Medicine

## 2022-09-04 ENCOUNTER — Other Ambulatory Visit: Payer: Self-pay | Admitting: Family Medicine

## 2022-09-04 ENCOUNTER — Telehealth: Payer: Self-pay

## 2022-09-04 MED ORDER — TRAMADOL HCL 50 MG PO TABS: ORAL_TABLET | ORAL | 0 refills | Status: DC

## 2022-09-04 NOTE — Telephone Encounter (Signed)
Pt called in to request a refill of: Pt would also like to nurse/pcp know that pt has changed pharmacy's to [facility] [address] Prescription Request  09/04/2022  LOV: 05/20/22  What is the name of the medication or equipment? traMADol (ULTRAM) 50 MG tablet [[id number]]  Have you contacted your pharmacy to request a refill? No   Which pharmacy would you like this sent to?  [facility] [address] [place] - [address] [place] [address] Phone: [phone]  Fax: [phone]    Patient notified that their request is being sent to the clinical staff for review and that they should receive a response within 2 business days.   Please advise at [facility] [phone]

## 2022-09-09 ENCOUNTER — Telehealth: Payer: Self-pay

## 2022-09-09 NOTE — Telephone Encounter (Signed)
Called Pt cell per message received from Dr. Ladona Ridgel, and [facility] Triage, Berdine Addison RN   Per Berdine Addison:   Hi, Patient is complaining of leg cramps. Would you recommend potassium chloride 10 mEq? Please advise.  Thanks   Response per Dr. Lewayne Bunting:     Agree with potassium. GT   Called Pt to advise / take Potassium 10 Meq daily for leg cramping.  Unable to leave voicemail message;  Per home telephone number advised Pt is on vacation for one week.  Follow up will be required.

## 2022-09-16 ENCOUNTER — Telehealth: Payer: Self-pay | Admitting: Internal Medicine

## 2022-09-16 NOTE — Telephone Encounter (Signed)
Patient is requesting call back to go over heart monitor results. Please advise.

## 2022-09-24 NOTE — Telephone Encounter (Signed)
The atrial fib is fairly well controlled. Occaisional PVC's. No change in treatment.

## 2022-09-25 ENCOUNTER — Encounter: Payer: Self-pay | Admitting: *Deleted

## 2022-09-25 NOTE — Telephone Encounter (Signed)
My chart message sent to pt ./cy

## 2022-10-02 ENCOUNTER — Telehealth: Payer: Self-pay

## 2022-10-02 NOTE — Telephone Encounter (Signed)
Two questions:  Pt states he found a lone star tick on him yesterday and asks if he need to be started on antibiotics. No fever, body aches or rash.   Pt states Cardiologist reviewed his Zio monitor result and the Cardiologist has decided to not do anything else regarding his a-fib. Pt asks your opinion regarding. Thanks.

## 2022-10-31 ENCOUNTER — Ambulatory Visit (INDEPENDENT_AMBULATORY_CARE_PROVIDER_SITE_OTHER): Payer: Medicare Other

## 2022-10-31 ENCOUNTER — Telehealth: Payer: Self-pay

## 2022-10-31 VITALS — BP 116/72 | Ht 70.0 in | Wt 214.0 lb

## 2022-10-31 DIAGNOSIS — Z0000 Encounter for general adult medical examination without abnormal findings: Secondary | ICD-10-CM

## 2022-10-31 NOTE — Telephone Encounter (Signed)
Patient seen for AWV and was concerned with continued problems with Afib.  He saw Cardiology 5/6 and was switched to Diltiazem from Metoprolol and Flecainide.  He is still having problems with sporadic increases in HR.  HR in 120s today in office and then down to 70s. He is wondering if there is any input that PCP has on medications that he is currently on.  Not due to see cardiology again until 2025.  Please advise.

## 2022-10-31 NOTE — Patient Instructions (Signed)
[name] [name] , Thank you for taking time to come for your Medicare Wellness Visit. I appreciate your ongoing commitment to your health goals. Please review the following plan we discussed and let me know if I can assist you in the future.   Referrals/Orders/Follow-Ups/Clinician Recommendations: I will be in touch with what Dr. Tanya Nones recommends with your heart rate and the medications that you are currently on   This is a list of the screening recommended for you and due dates:  Health Maintenance  Topic Date Due   Zoster (Shingles) Vaccine (1 of 2) 05/10/1964   COVID-19 Vaccine (4 - 2023-24 season) 12/07/2021   Medicare Annual Wellness Visit  03/08/2022   Hepatitis C Screening  05/21/2023*   Flu Shot  11/07/2022   Pneumonia Vaccine  Completed   HPV Vaccine  Aged Out   DTaP/Tdap/Td vaccine  Discontinued  *Topic was postponed. The date shown is not the original due date.    Advanced directives: (ACP Link)Information on Advanced Care Planning can be found at [facility] Advance Health Care Directives Advance Health Care Directives ([email or web address]   Next Medicare Annual Wellness Visit scheduled for next year: Yes  Preventive Care 65 Years and Older, Male  Preventive care refers to lifestyle choices and visits with your health care provider that can promote health and wellness. What does preventive care include? A yearly physical exam. This is also called an annual well check. Dental exams once or twice a year. Routine eye exams. Ask your health care provider how often you should have your eyes checked. Personal lifestyle choices, including: Daily care of your teeth and gums. Regular physical activity. Eating a healthy diet. Avoiding tobacco and drug use. Limiting alcohol use. Practicing safe sex. Taking low doses of aspirin every day. Taking vitamin and mineral supplements as recommended by your health care provider. What happens during an annual well check? The  services and screenings done by your health care provider during your annual well check will depend on your age, overall health, lifestyle risk factors, and family history of disease. Counseling  Your health care provider may ask you questions about your: Alcohol use. Tobacco use. Drug use. Emotional well-being. Home and relationship well-being. Sexual activity. Eating habits. History of falls. Memory and ability to understand (cognition). Work and work Astronomer. Screening  You may have the following tests or measurements: Height, weight, and BMI. Blood pressure. Lipid and cholesterol levels. These may be checked every 5 years, or more frequently if you are over 39 years old. Skin check. Lung cancer screening. You may have this screening every year starting at age 35 if you have a 30-pack-year history of smoking and currently smoke or have quit within the past 15 years. Fecal occult blood test (FOBT) of the stool. You may have this test every year starting at age 29. Flexible sigmoidoscopy or colonoscopy. You may have a sigmoidoscopy every 5 years or a colonoscopy every 10 years starting at age 82. Prostate cancer screening. Recommendations will vary depending on your family history and other risks. Hepatitis C blood test. Hepatitis B blood test. Sexually transmitted disease (STD) testing. Diabetes screening. This is done by checking your blood sugar (glucose) after you have not eaten for a while (fasting). You may have this done every 1-3 years. Abdominal aortic aneurysm (AAA) screening. You may need this if you are a current or former smoker. Osteoporosis. You may be screened starting at age 97 if you are at high risk. Talk with  your health care provider about your test results, treatment options, and if necessary, the need for more tests. Vaccines  Your health care provider may recommend certain vaccines, such as: Influenza vaccine. This is recommended every year. Tetanus,  diphtheria, and acellular pertussis (Tdap, Td) vaccine. You may need a Td booster every 10 years. Zoster vaccine. You may need this after age 47. Pneumococcal 13-valent conjugate (PCV13) vaccine. One dose is recommended after age 40. Pneumococcal polysaccharide (PPSV23) vaccine. One dose is recommended after age 57. Talk to your health care provider about which screenings and vaccines you need and how often you need them. This information is not intended to replace advice given to you by your health care provider. Make sure you discuss any questions you have with your health care provider. Document Released: 04/21/2015 Document Revised: 12/13/2015 Document Reviewed: 01/24/2015 Elsevier Interactive Patient Education  2017 [employer].  Fall Prevention in the Home Falls can cause injuries. They can happen to people of all ages. There are many things you can do to make your home safe and to help prevent falls. What can I do on the outside of my home? Regularly fix the edges of walkways and driveways and fix any cracks. Remove anything that might make you trip as you walk through a door, such as a raised step or threshold. Trim any bushes or trees on the path to your home. Use bright outdoor lighting. Clear any walking paths of anything that might make someone trip, such as rocks or tools. Regularly check to see if handrails are loose or broken. Make sure that both sides of any steps have handrails. Any raised decks and porches should have guardrails on the edges. Have any leaves, snow, or ice cleared regularly. Use sand or salt on walking paths during winter. Clean up any spills in your garage right away. This includes oil or grease spills. What can I do in the bathroom? Use night lights. Install grab bars by the toilet and in the tub and shower. Do not use towel bars as grab bars. Use non-skid mats or decals in the tub or shower. If you need to sit down in the shower, use a plastic,  non-slip stool. Keep the floor dry. Clean up any water that spills on the floor as soon as it happens. Remove soap buildup in the tub or shower regularly. Attach bath mats securely with double-sided non-slip rug tape. Do not have throw rugs and other things on the floor that can make you trip. What can I do in the bedroom? Use night lights. Make sure that you have a light by your bed that is easy to reach. Do not use any sheets or blankets that are too big for your bed. They should not hang down onto the floor. Have a firm chair that has side arms. You can use this for support while you get dressed. Do not have throw rugs and other things on the floor that can make you trip. What can I do in the kitchen? Clean up any spills right away. Avoid walking on wet floors. Keep items that you use a lot in easy-to-reach places. If you need to reach something above you, use a strong step stool that has a grab bar. Keep electrical cords out of the way. Do not use floor polish or wax that makes floors slippery. If you must use wax, use non-skid floor wax. Do not have throw rugs and other things on the floor that can make you trip.  What can I do with my stairs? Do not leave any items on the stairs. Make sure that there are handrails on both sides of the stairs and use them. Fix handrails that are broken or loose. Make sure that handrails are as long as the stairways. Check any carpeting to make sure that it is firmly attached to the stairs. Fix any carpet that is loose or worn. Avoid having throw rugs at the top or bottom of the stairs. If you do have throw rugs, attach them to the floor with carpet tape. Make sure that you have a light switch at the top of the stairs and the bottom of the stairs. If you do not have them, ask someone to add them for you. What else can I do to help prevent falls? Wear shoes that: Do not have high heels. Have rubber bottoms. Are comfortable and fit you well. Are closed  at the toe. Do not wear sandals. If you use a stepladder: Make sure that it is fully opened. Do not climb a closed stepladder. Make sure that both sides of the stepladder are locked into place. Ask someone to hold it for you, if possible. Clearly mark and make sure that you can see: Any grab bars or handrails. First and last steps. Where the edge of each step is. Use tools that help you move around (mobility aids) if they are needed. These include: Canes. Walkers. Scooters. Crutches. Turn on the lights when you go into a dark area. Replace any light bulbs as soon as they burn out. Set up your furniture so you have a clear path. Avoid moving your furniture around. If any of your floors are uneven, fix them. If there are any pets around you, be aware of where they are. Review your medicines with your doctor. Some medicines can make you feel dizzy. This can increase your chance of falling. Ask your doctor what other things that you can do to help prevent falls. This information is not intended to replace advice given to you by your health care provider. Make sure you discuss any questions you have with your health care provider. Document Released: 01/19/2009 Document Revised: 08/31/2015 Document Reviewed: 04/29/2014 Elsevier Interactive Patient Education  2017 [employer].

## 2022-10-31 NOTE — Progress Notes (Signed)
Subjective:   [name] [name] is a 77 y.o. male who presents for Medicare Annual/Subsequent preventive examination.  Visit Complete: In person  Review of Systems     Cardiac Risk Factors include: advanced age (>76men, >74 women);male gender;hypertension;dyslipidemia     Objective:    Today's Vitals   10/31/22 0930  BP: 116/72  Weight: 214 lb (97.1 kg)  Height: 5\' 10"  (1.778 m)   Body mass index is 30.71 kg/m.     10/31/2022   10:23 AM 08/29/2021   12:27 PM 03/08/2021   10:06 AM 09/19/2020    8:02 AM 09/07/2020   11:23 AM  Advanced Directives  Does Patient Have a Medical Advance Directive? No Yes Yes Yes Yes  Type of Best boy of [place];Living will Healthcare Power of [place];Living will Healthcare Power of [place];Living will  Does patient want to make changes to medical advance directive?    No - Patient declined No - Patient declined  Copy of Healthcare Power of Attorney in Chart?   No - copy requested No - copy requested No - copy requested  Would patient like information on creating a medical advance directive? Yes (MAU/Ambulatory/Procedural Areas - Information given)  No - Patient declined No - Patient declined No - Patient declined    Current Medications (verified) Outpatient Encounter Medications as of 10/31/2022  Medication Sig   atorvastatin (LIPITOR) 20 MG tablet TAKE 1 TABLET(20 MG) BY MOUTH DAILY   diltiazem (CARDIZEM CD) 120 MG 24 hr capsule Take 1 capsule (120 mg total) by mouth daily. Take with or after meal.   losartan (COZAAR) 50 MG tablet TAKE 1 TABLET(50 MG) BY MOUTH DAILY   rivaroxaban (XARELTO) 20 MG TABS tablet Take 1 tablet (20 mg total) by mouth daily with supper.   traMADol (ULTRAM) 50 MG tablet TAKE 1 TABLET BY MOUTH EVERY 6 HOURS AS NEEDED.   No facility-administered encounter medications on file as of 10/31/2022.    Allergies (verified) Patient has no known allergies.   History: Past Medical History:   Diagnosis Date   CVA (cerebral infarction) 07-14-09   Dysrhythmia    History of kidney stones    Hyperlipidemia    Hypertension    Nephrolithiasis    Paroxysmal atrial fibrillation (HCC)    Paroxysmal atrial flutter (HCC)    a. s/p ablation 2012.   Prostate cancer [facility])    Stroke [facility])    Past Surgical History:  Procedure Laterality Date   APPENDECTOMY     BACK SURGERY     CYSTOSCOPY/URETEROSCOPY/HOLMIUM LASER/STENT PLACEMENT Left 09/19/2020   Procedure: CYSTOSCOPY RETROGRADE PYELOGRAM URETEROSCOPY/HOLMIUM LASER/STENT PLACEMENT;  Surgeon: Bjorn Pippin, MD;  Location: [facility];  Service: Urology;  Laterality: Left;   EYE SURGERY     PROSTATECTOMY  2005   Family History  Problem Relation Age of Onset   Heart disease Mother 81       died   Prostate cancer Father        alive   Testicular cancer Brother        died   Social History   Socioeconomic History   Marital status: Married    Spouse name: Not on file   Number of children: 2   Years of education: Not on file   Highest education level: Not on file  Occupational History   Occupation: Personnel officer: [employer]  Tobacco Use   Smoking status: Never   Smokeless tobacco: Never  Vaping Use   Vaping status: Never Used  Substance and Sexual Activity   Alcohol use: No   Drug use: No   Sexual activity: Not on file  Other Topics Concern   Not on file  Social History Narrative   Not on file   Social Determinants of Health   Financial Resource Strain: Low Risk  (10/31/2022)   Overall Financial Resource Strain (CARDIA)    Difficulty of Paying Living Expenses: Not hard at all  Food Insecurity: No Food Insecurity (10/31/2022)   Hunger Vital Sign    Worried About Running Out of Food in the Last Year: Never true    Ran Out of Food in the Last Year: Never true  Transportation Needs: No Transportation Needs (10/31/2022)   PRAPARE - Administrator, Civil Service (Medical): No    Lack of  Transportation (Non-Medical): No  Physical Activity: Insufficiently Active (10/31/2022)   Exercise Vital Sign    Days of Exercise per Week: 3 days    Minutes of Exercise per Session: 20 min  Stress: No Stress Concern Present (10/31/2022)   [employer] of Occupational Health - Occupational Stress Questionnaire    Feeling of Stress : Not at all  Social Connections: Socially Integrated (10/31/2022)   Social Connection and Isolation Panel [NHANES]    Frequency of Communication with Friends and Family: More than three times a week    Frequency of Social Gatherings with Friends and Family: Three times a week    Attends Religious Services: More than 4 times per year    Active Member of Clubs or Organizations: Yes    Attends Banker Meetings: 1 to 4 times per year    Marital Status: Married    Tobacco Counseling Counseling given: Not Answered   Clinical Intake:  Pre-visit preparation completed: Yes  Pain : No/denies pain     Diabetes: No  How often do you need to have someone help you when you read instructions, pamphlets, or other written materials from your doctor or pharmacy?: 1 - Never  Interpreter Needed?: No  Information entered by :: Kandis Fantasia LPN   Activities of Daily Living    10/31/2022   10:19 AM  In your present state of health, do you have any difficulty performing the following activities:  Hearing? 0  Vision? 0  Difficulty concentrating or making decisions? 0  Walking or climbing stairs? 0  Dressing or bathing? 0  Doing errands, shopping? 0  Preparing Food and eating ? N  Using the Toilet? N  In the past six months, have you accidently leaked urine? N  Do you have problems with loss of bowel control? N  Managing your Medications? N  Managing your Finances? N  Housekeeping or managing your Housekeeping? N    Patient Care Team: [name], MD as PCP - General (Family Medicine) Marinus Maw, MD as PCP - Electrophysiology  (Cardiology) [name], MD (Family Medicine)  Indicate any recent Medical Services you may have received from other than [facility] providers in the past year (date may be approximate).     Assessment:   This is a routine wellness examination for [place].  Hearing/Vision screen Hearing Screening - Comments:: Denies hearing difficulties   Vision Screening - Comments:: Wears rx glasses - up to date with routine eye exams with Dr. Sharyn Dross    Dietary issues and exercise activities discussed:     Goals Addressed  This Visit's Progress    Remain active and independent        Depression Screen    10/31/2022   10:19 AM 05/20/2022    8:44 AM 03/08/2021   10:04 AM 06/28/2020    8:12 AM 06/01/2018    9:27 AM 08/27/2016   12:46 PM 08/27/2016   10:28 AM  PHQ 2/9 Scores  PHQ - 2 Score 0 0 0 0 0 0 0  PHQ- 9 Score       0    Fall Risk    10/31/2022   10:24 AM 05/20/2022    8:44 AM 03/08/2021   10:06 AM 06/28/2020    8:12 AM 06/01/2018    9:27 AM  Fall Risk   Falls in the past year? 0 0 0 0 0  Number falls in past yr: 0 0 0    Injury with Fall? 0 0 0    Risk for fall due to : No Fall Risks No Fall Risks No Fall Risks No Fall Risks   Follow up Falls prevention discussed;Education provided;Falls evaluation completed Falls prevention discussed Falls prevention discussed Falls evaluation completed Falls evaluation completed    MEDICARE RISK AT HOME:  Medicare Risk at Home - 10/31/22 1024     Any stairs in or around the home? No    If so, are there any without handrails? No    Home free of loose throw rugs in walkways, pet beds, electrical cords, etc? Yes    Adequate lighting in your home to reduce risk of falls? Yes    Life alert? No    Use of a cane, walker or w/c? No    Grab bars in the bathroom? Yes    Shower chair or bench in shower? No    Elevated toilet seat or a handicapped toilet? Yes             TIMED UP AND GO:  Was the test performed?  Yes   Length of time to ambulate 10 feet: 8 sec Gait steady and fast without use of assistive device    Cognitive Function:        10/31/2022   10:24 AM  6CIT Screen  What Year? 0 points  What month? 0 points  What time? 0 points  Count back from 20 0 points  Months in reverse 0 points  Repeat phrase 0 points  Total Score 0 points    Immunizations Immunization History  Administered Date(s) Administered   Moderna Sars-Covid-2 Vaccination 05/01/2019, 05/31/2019, 02/29/2020   Pneumococcal Conjugate-13 06/01/2018   Pneumococcal Polysaccharide-23 06/29/2009, 02/10/2015   Zoster, Live 01/13/2011    TDAP status: Due, Education has been provided regarding the importance of this vaccine. Advised may receive this vaccine at local pharmacy or Health Dept. Aware to provide a copy of the vaccination record if obtained from local pharmacy or Health Dept. Verbalized acceptance and understanding.  Pneumococcal vaccine status: Up to date  Covid-19 vaccine status: Information provided on how to obtain vaccines.   Qualifies for Shingles Vaccine? Yes   Zostavax completed No   Shingrix Completed?: No.    Education has been provided regarding the importance of this vaccine. Patient has been advised to call insurance company to determine out of pocket expense if they have not yet received this vaccine. Advised may also receive vaccine at local pharmacy or Health Dept. Verbalized acceptance and understanding.  Screening Tests Health Maintenance  Topic Date Due   Zoster Vaccines- Shingrix (1 of 2)  [birth year]/10/1962   COVID-19 Vaccine (4 - 2023-24 season) 12/07/2021   Hepatitis C Screening  05/21/2023 (Originally 05/10/1963)   INFLUENZA VACCINE  11/07/2022   Medicare Annual Wellness (AWV)  10/31/2023   Pneumonia Vaccine 64+ Years old  Completed   HPV VACCINES  Aged Out   DTaP/Tdap/Td  Discontinued    Health Maintenance  Health Maintenance Due  Topic Date Due   Zoster Vaccines- Shingrix (1 of 2)  05/10/1962   COVID-19 Vaccine (4 - 2023-24 season) 12/07/2021    Colorectal cancer screening: No longer required.   Lung Cancer Screening: (Low Dose CT Chest recommended if Age 59-80 years, 20 pack-year currently smoking OR have quit w/in 15years.) does not qualify.   Lung Cancer Screening Referral: n/a  Additional Screening:  Hepatitis C Screening: does qualify; Declines at this time   Vision Screening: Recommended annual ophthalmology exams for early detection of glaucoma and other disorders of the eye. Is the patient up to date with their annual eye exam?  Yes  Who is the provider or what is the name of the office in which the patient attends annual eye exams? Dr. Zenaida Niece  If pt is not established with a provider, would they like to be referred to a provider to establish care? No .   Dental Screening: Recommended annual dental exams for proper oral hygiene  Community Resource Referral / Chronic Care Management: CRR required this visit?  No   CCM required this visit?  No     Plan:     I have personally reviewed and noted the following in the patient's chart:   Medical and social history Use of alcohol, tobacco or illicit drugs  Current medications and supplements including opioid prescriptions. Patient is currently taking opioid prescriptions. Information provided to patient regarding non-opioid alternatives. Patient advised to discuss non-opioid treatment plan with their provider. Functional ability and status Nutritional status Physical activity Advanced directives List of other physicians Hospitalizations, surgeries, and ER visits in previous 12 months Vitals Screenings to include cognitive, depression, and falls Referrals and appointments  In addition, I have reviewed and discussed with patient certain preventive protocols, quality metrics, and best practice recommendations. A written personalized care plan for preventive services as well as general preventive health  recommendations were provided to patient.     Kandis Fantasia [place], [place]   9/56/2130   Nurse Notes: See telephone note with patient concerns

## 2022-12-06 ENCOUNTER — Encounter (HOSPITAL_COMMUNITY): Payer: Self-pay | Admitting: Family Medicine

## 2022-12-06 ENCOUNTER — Ambulatory Visit (INDEPENDENT_AMBULATORY_CARE_PROVIDER_SITE_OTHER): Payer: Medicare Other | Admitting: Family Medicine

## 2022-12-06 VITALS — BP 114/62 | HR 91 | Temp 97.8°F | Ht 70.0 in | Wt 214.2 lb

## 2022-12-06 DIAGNOSIS — I10 Essential (primary) hypertension: Secondary | ICD-10-CM | POA: Diagnosis not present

## 2022-12-06 DIAGNOSIS — I4891 Unspecified atrial fibrillation: Secondary | ICD-10-CM | POA: Diagnosis not present

## 2022-12-06 DIAGNOSIS — R5383 Other fatigue: Secondary | ICD-10-CM

## 2022-12-06 DIAGNOSIS — R0600 Dyspnea, unspecified: Secondary | ICD-10-CM

## 2022-12-06 DIAGNOSIS — Z8673 Personal history of transient ischemic attack (TIA), and cerebral infarction without residual deficits: Secondary | ICD-10-CM | POA: Diagnosis not present

## 2022-12-06 DIAGNOSIS — E7800 Pure hypercholesterolemia, unspecified: Secondary | ICD-10-CM | POA: Diagnosis not present

## 2022-12-06 NOTE — Progress Notes (Signed)
Subjective:    Patient ID: [name] [name], male    DOB: [birth year], 77 y.o.   MRN: [id number]  HPI Patient is a 77 year old patient gentleman history of atrial fibrillation.  He is currently rate on Cardizem and anticoagulated with Xarelto.  He denies any bleeding or bruising.  He is not currently checking his heart rate.  He does report fatigue.  He reports giving out of energy quickly and easily.  Occasionally he will have shortness of breath.  He denies any chest pain.  He denies any orthopnea. Past Medical History:  Diagnosis Date   CVA (cerebral infarction) 07-14-09   Dysrhythmia    History of kidney stones    Hyperlipidemia    Hypertension    Nephrolithiasis    Paroxysmal atrial fibrillation (HCC)    Paroxysmal atrial flutter (HCC)    a. s/p ablation 2012.   Prostate cancer [facility])    Stroke [facility])    Past Surgical History:  Procedure Laterality Date   APPENDECTOMY     BACK SURGERY     CYSTOSCOPY/URETEROSCOPY/HOLMIUM LASER/STENT PLACEMENT Left 09/19/2020   Procedure: CYSTOSCOPY RETROGRADE PYELOGRAM URETEROSCOPY/HOLMIUM LASER/STENT PLACEMENT;  Surgeon: Bjorn Pippin, MD;  Location: [facility];  Service: Urology;  Laterality: Left;   EYE SURGERY     PROSTATECTOMY  2005   Current Outpatient Medications on File Prior to Visit  Medication Sig Dispense Refill   atorvastatin (LIPITOR) 20 MG tablet TAKE 1 TABLET(20 MG) BY MOUTH DAILY 90 tablet 3   diltiazem (CARDIZEM CD) 120 MG 24 hr capsule Take 1 capsule (120 mg total) by mouth daily. Take with or after meal. 90 capsule 3   losartan (COZAAR) 50 MG tablet TAKE 1 TABLET(50 MG) BY MOUTH DAILY 90 tablet 1   rivaroxaban (XARELTO) 20 MG TABS tablet Take 1 tablet (20 mg total) by mouth daily with supper. 90 tablet 1   traMADol (ULTRAM) 50 MG tablet TAKE 1 TABLET BY MOUTH EVERY 6 HOURS AS NEEDED. 120 tablet 0   No current facility-administered medications on file prior to visit.     No Known Allergies Social History   Socioeconomic History    Marital status: Married    Spouse name: Not on file   Number of children: 2   Years of education: Not on file   Highest education level: Not on file  Occupational History   Occupation: Personnel officer: [employer]  Tobacco Use   Smoking status: Never   Smokeless tobacco: Never  Vaping Use   Vaping status: Never Used  Substance and Sexual Activity   Alcohol use: No   Drug use: No   Sexual activity: Not on file  Other Topics Concern   Not on file  Social History Narrative   Not on file   Social Determinants of Health   Financial Resource Strain: Low Risk  (10/31/2022)   Overall Financial Resource Strain (CARDIA)    Difficulty of Paying Living Expenses: Not hard at all  Food Insecurity: No Food Insecurity (10/31/2022)   Hunger Vital Sign    Worried About Running Out of Food in the Last Year: Never true    Ran Out of Food in the Last Year: Never true  Transportation Needs: No Transportation Needs (10/31/2022)   PRAPARE - Administrator, Civil Service (Medical): No    Lack of Transportation (Non-Medical): No  Physical Activity: Insufficiently Active (10/31/2022)   Exercise Vital Sign    Days of Exercise per Week: 3  days    Minutes of Exercise per Session: 20 min  Stress: No Stress Concern Present (10/31/2022)   [employer] of Occupational Health - Occupational Stress Questionnaire    Feeling of Stress : Not at all  Social Connections: Socially Integrated (10/31/2022)   Social Connection and Isolation Panel [NHANES]    Frequency of Communication with Friends and Family: More than three times a week    Frequency of Social Gatherings with Friends and Family: Three times a week    Attends Religious Services: More than 4 times per year    Active Member of Clubs or Organizations: Yes    Attends Banker Meetings: 1 to 4 times per year    Marital Status: Married  Catering manager Violence: Not At Risk (10/31/2022)   Humiliation, Afraid,  Rape, and Kick questionnaire    Fear of Current or Ex-Partner: No    Emotionally Abused: No    Physically Abused: No    Sexually Abused: No    Review of Systems     Objective:   Physical Exam Vitals reviewed.  Constitutional:      General: He is not in acute distress.    Appearance: Normal appearance. He is not ill-appearing or toxic-appearing.  Eyes:     General: Lids are normal. No visual field deficit.       Right eye: No discharge.        Left eye: No discharge.     Extraocular Movements: Extraocular movements intact.     Right eye: Normal extraocular motion and no nystagmus.     Left eye: Normal extraocular motion and no nystagmus.     Conjunctiva/sclera: Conjunctivae normal.     Right eye: Right conjunctiva is not injected. No chemosis or exudate.    Left eye: Left conjunctiva is not injected. No chemosis or exudate.    Pupils: Pupils are equal, round, and reactive to light.  Cardiovascular:     Rate and Rhythm: Normal rate and regular rhythm.     Pulses:          Dorsalis pedis pulses are 2+ on the right side.       Posterior tibial pulses are 2+ on the right side.     Heart sounds: No murmur heard.    No gallop.  Pulmonary:     Effort: Pulmonary effort is normal. No respiratory distress.     Breath sounds: Normal breath sounds. No wheezing, rhonchi or rales.  Abdominal:     General: Abdomen is flat.     Palpations: Abdomen is soft.  Musculoskeletal:     Right lower leg: No edema.     Left lower leg: No edema.     Right foot: Normal range of motion. No deformity or bunion.  Feet:     Right foot:     Skin integrity: Skin integrity normal. No ulcer, blister, skin breakdown, erythema, warmth or callus.  Neurological:     General: No focal deficit present.     Mental Status: He is alert and oriented to person, place, and time.     Cranial Nerves: No cranial nerve deficit.     Sensory: No sensory deficit.     Motor: No weakness.     Coordination: Coordination  normal.     Gait: Gait normal.       Assessment & Plan:  Atrial fibrillation, unspecified type (HCC) - Plan: CBC with Differential/Platelet, COMPLETE METABOLIC PANEL WITH GFR, Lipid panel  History of TIA (transient  ischemic attack) - Plan: CBC with Differential/Platelet, COMPLETE METABOLIC PANEL WITH GFR, Lipid panel  Pure hypercholesterolemia - Plan: CBC with Differential/Platelet, COMPLETE METABOLIC PANEL WITH GFR, Lipid panel  Benign essential HTN - Plan: CBC with Differential/Platelet, COMPLETE METABOLIC PANEL WITH GFR, Lipid panel  Fatigue, unspecified type - Plan: Vitamin B12, Testosterone Total,Free,Bio, Males Patient's blood pressure today is well-controlled at 114/62.  I will check a CBC to screen for anemia.  I will check a CMP to monitor for liver or kidney issues.  Check a fasting lipid panel.  I like to see his LDL cholesterol below 100.  Given his fatigue I will check a B12 level and a testosterone level.  His TSH was normal in February.  He has a history of prostate cancer so I would be hesitant to begin testosterone supplementation however if it significantly low may explain his fatigue.  Given his shortness of breath and history of atrial fibrillation I will also get an echocardiogram to evaluate his cardiac output.

## 2022-12-07 LAB — CBC WITH DIFFERENTIAL/PLATELET
Absolute Monocytes: 436 {cells}/uL (ref 200–950)
Basophils Absolute: 39 {cells}/uL (ref 0–200)
Basophils Relative: 0.8 %
Eosinophils Absolute: 147 {cells}/uL (ref 15–500)
Eosinophils Relative: 3 %
HCT: 46.1 % (ref 38.5–50.0)
Hemoglobin: 15.4 g/dL (ref 13.2–17.1)
Lymphs Abs: 1093 {cells}/uL (ref 850–3900)
MCH: 30.7 pg (ref 27.0–33.0)
MCHC: 33.4 g/dL (ref 32.0–36.0)
MCV: 91.8 fL (ref 80.0–100.0)
MPV: 10.2 fL (ref 7.5–12.5)
Monocytes Relative: 8.9 %
Neutro Abs: 3185 {cells}/uL (ref 1500–7800)
Neutrophils Relative %: 65 %
Platelets: 226 10*3/uL (ref 140–400)
RBC: 5.02 10*6/uL (ref 4.20–5.80)
RDW: 12 % (ref 11.0–15.0)
Total Lymphocyte: 22.3 %
WBC: 4.9 10*3/uL (ref 3.8–10.8)

## 2022-12-07 LAB — COMPLETE METABOLIC PANEL WITH GFR
AG Ratio: 1.6 (calc) (ref 1.0–2.5)
ALT: 19 U/L (ref 9–46)
AST: 17 U/L (ref 10–35)
Albumin: 4.1 g/dL (ref 3.6–5.1)
Alkaline phosphatase (APISO): 70 U/L (ref 35–144)
BUN: 18 mg/dL (ref 7–25)
CO2: 22 mmol/L (ref 20–32)
Calcium: 9.2 mg/dL (ref 8.6–10.3)
Chloride: 107 mmol/L (ref 98–110)
Creat: 1.08 mg/dL (ref 0.70–1.28)
Globulin: 2.5 g/dL (ref 1.9–3.7)
Glucose, Bld: 152 mg/dL — ABNORMAL HIGH (ref 65–99)
Potassium: 4.1 mmol/L (ref 3.5–5.3)
Sodium: 141 mmol/L (ref 135–146)
Total Bilirubin: 0.8 mg/dL (ref 0.2–1.2)
Total Protein: 6.6 g/dL (ref 6.1–8.1)
eGFR: 70 mL/min/{1.73_m2} (ref >=60)

## 2022-12-07 LAB — LIPID PANEL
Cholesterol: 111 mg/dL (ref <200)
HDL: 28 mg/dL — ABNORMAL LOW (ref >=40)
LDL Cholesterol (Calc): 65 mg/dL
Non-HDL Cholesterol (Calc): 83 mg/dL (ref <130)
Total CHOL/HDL Ratio: 4 (calc) (ref <5.0)
Triglycerides: 98 mg/dL (ref <150)

## 2022-12-07 LAB — TESTOSTERONE TOTAL,FREE,BIO, MALES
Albumin: 4.1 g/dL (ref 3.6–5.1)
Sex Hormone Binding: 52 nmol/L (ref 22–77)
Testosterone, Bioavailable: 91.3 ng/dL (ref 15.0–150.0)
Testosterone, Free: 48.5 pg/mL (ref 6.0–73.0)
Testosterone: 523 ng/dL (ref 250–827)

## 2022-12-07 LAB — VITAMIN B12: Vitamin B-12: 852 pg/mL (ref 200–1100)

## 2022-12-18 DIAGNOSIS — K080 Exfoliation of teeth due to systemic causes: Secondary | ICD-10-CM | POA: Diagnosis not present

## 2022-12-24 DIAGNOSIS — K080 Exfoliation of teeth due to systemic causes: Secondary | ICD-10-CM | POA: Diagnosis not present

## 2022-12-26 DIAGNOSIS — H04123 Dry eye syndrome of bilateral lacrimal glands: Secondary | ICD-10-CM | POA: Diagnosis not present

## 2022-12-26 DIAGNOSIS — H11153 Pinguecula, bilateral: Secondary | ICD-10-CM | POA: Diagnosis not present

## 2022-12-26 DIAGNOSIS — H02831 Dermatochalasis of right upper eyelid: Secondary | ICD-10-CM | POA: Diagnosis not present

## 2022-12-26 DIAGNOSIS — H40013 Open angle with borderline findings, low risk, bilateral: Secondary | ICD-10-CM | POA: Diagnosis not present

## 2022-12-28 DIAGNOSIS — K080 Exfoliation of teeth due to systemic causes: Secondary | ICD-10-CM | POA: Diagnosis not present

## 2022-12-31 ENCOUNTER — Ambulatory Visit (HOSPITAL_COMMUNITY): Payer: Medicare Other | Attending: Family Medicine

## 2022-12-31 DIAGNOSIS — R0600 Dyspnea, unspecified: Secondary | ICD-10-CM | POA: Diagnosis not present

## 2022-12-31 DIAGNOSIS — R5383 Other fatigue: Secondary | ICD-10-CM | POA: Diagnosis not present

## 2022-12-31 LAB — ECHOCARDIOGRAM COMPLETE
AR max vel: 1.71 cm2
AV Area VTI: 1.59 cm2
AV Area mean vel: 1.74 cm2
AV Mean grad: 4.4 mmHg
AV Peak grad: 8.7 mmHg
Ao pk vel: 1.48 m/s
Area-P 1/2: 4.18 cm2
S' Lateral: 3.7 cm

## 2023-01-06 ENCOUNTER — Telehealth: Payer: Self-pay | Admitting: Internal Medicine

## 2023-01-06 NOTE — Telephone Encounter (Signed)
Pt is requesting a callback regarding his PCP advising he speak with his cardiologist since they did an ECHO and has concerns with results. He'd like to discuss further once call back on his home phone but if no answer he'd like a call on his cell number as well. Please advise

## 2023-01-06 NOTE — Telephone Encounter (Signed)
Spoke with Pt. Pt states PCP wants him to see Dr Ladona Ridgel asap. Chart shows abnormal echo. Dr.Taylor is not in the office for a few more weeks but Otilio Saber had an opening. Set pt up with appointment for this week with Otilio Saber PA-C

## 2023-01-09 ENCOUNTER — Ambulatory Visit: Payer: Medicare Other | Attending: Student | Admitting: Student

## 2023-01-09 ENCOUNTER — Encounter: Payer: Self-pay | Admitting: Student

## 2023-01-09 VITALS — BP 110/76 | HR 86 | Ht 70.0 in | Wt 211.0 lb

## 2023-01-09 DIAGNOSIS — I10 Essential (primary) hypertension: Secondary | ICD-10-CM

## 2023-01-09 DIAGNOSIS — I495 Sick sinus syndrome: Secondary | ICD-10-CM

## 2023-01-09 DIAGNOSIS — I340 Nonrheumatic mitral (valve) insufficiency: Secondary | ICD-10-CM

## 2023-01-09 DIAGNOSIS — I4821 Permanent atrial fibrillation: Secondary | ICD-10-CM | POA: Diagnosis not present

## 2023-01-09 MED ORDER — DILTIAZEM HCL ER COATED BEADS 180 MG PO CP24: 180.0000 mg | ORAL_CAPSULE | Freq: Every day | ORAL | 3 refills | Status: DC

## 2023-01-09 NOTE — Progress Notes (Signed)
  Electrophysiology Office Note:   Date:  01/09/2023  ID:  Gaetano Net Bugaj, DOB 1945-06-20, MRN [id number]  Primary Cardiologist: None Electrophysiologist: Lewayne Bunting, MD      History of Present Illness:   [name] [name] is a 77 y.o. male with h/o Persistent AF, SND, and HTN seen today for acute visit due to abnormal echo.    Echo 12/31/2022 by PCP showed LVEF 45-50%, mild RV dysfunction, Severe LAE, Mod RAE, MV with bowing and moderate to severe anteriorly directed MR, felt to possible by UNDERestimated  Patient reports doing OK. Was at his PCP for regular check when murmur was noted. Does complain of fatigue over the past several months; Wasn't sure if it was from now being in AF all the time or from his valve. He denies chest pain, palpitations, dyspnea, PND, orthopnea, nausea, vomiting, dizziness, syncope, edema, weight gain, or early satiety.   Review of systems complete and found to be negative unless listed in HPI.   EP Information / Studies Reviewed:    EKG is ordered today. Personal review as below.  EKG Interpretation Date/Time:  Thursday January 09 2023 10:46:00 EDT Ventricular Rate:  113 PR Interval:    QRS Duration:  102 QT Interval:  292 QTC Calculation: 400 R Axis:   242  Text Interpretation: Atrial fibrillation with rapid ventricular response Right superior axis deviation Incomplete right bundle branch block Confirmed by Maxine Glenn [phone]) on 01/09/2023 10:55:38 AM    Echo 12/31/2022 by PCP showed LVEF 45-50%, mild RV dysfunction, Severe LAE, Mod RAE, MV with bowing and moderate to severe anteriorly directed MR, felt to possible by UNDERestimated  Physical Exam:   VS:  BP 110/76   Pulse 86   Ht 5\' 10"  (1.778 m)   Wt 211 lb (95.7 kg)   SpO2 98%   BMI 30.28 kg/m    Wt Readings from Last 3 Encounters:  01/09/23 211 lb (95.7 kg)  12/06/22 214 lb 3.2 oz (97.2 kg)  10/31/22 214 lb (97.1 kg)     GEN: Well nourished, well developed in no acute distress NECK:  No JVD; No carotid bruits CARDIAC: Irregularly irregular rate and rhythm. At least 3-4/6 systolic murmur that radiates to mid axillary.  RESPIRATORY:  Clear to auscultation without rales, wheezing or rhonchi  ABDOMEN: Soft, non-tender, non-distended EXTREMITIES:  No edema; No deformity   ASSESSMENT AND PLAN:    Mitral regurgitation, Mod to severe He has had gradual fatigue, but denies SOB or CP.  Discussed with structural team, and rather than planning TEE first then other procedures as needed, they will see in consult and schedule whatever is needed from that.  Much appreciate structural input.   Permanent AF Flecainide stopped 08/12/2022 to pursue rate control alone.  Continue Xarelto Rate slightly elevated on exam today, will increase diltiazem to 180 mg dail  HTN Stable on current regimen   Secondary hypercoagulable state Pt on Xarelto as above   HFmEF EF 45-50% by TTE Will defer to structural but likely planning at least TEE to further clarify.   Follow up with EP APP in 3 months to check in from an AF standpoint.   Signed, Graciella Freer, PA-C

## 2023-01-09 NOTE — Patient Instructions (Addendum)
Medication Instructions:  Increase diltiazem to 180 mg daily. *If you need a refill on your cardiac medications before your next appointment, please call your pharmacy*  Lab Work: None ordered If you have labs (blood work) drawn today and your tests are completely normal, you will receive your results only by: MyChart Message (if you have MyChart) OR A paper copy in the mail If you have any lab test that is abnormal or we need to change your treatment, we will call you to review the results.  Follow-Up: At [facility], you and your health needs are our priority.  As part of our continuing mission to provide you with exceptional heart care, we have created designated Provider Care Teams.  These Care Teams include your primary Cardiologist (physician) and Advanced Practice Providers (APPs -  Physician Assistants and Nurse Practitioners) who all work together to provide you with the care you need, when you need it.  Your next appointment:   01/13/2023 at 11:00 AM  Provider:   Alverda Skeans, MD   04/11/2023 at 11:00 AM with Otilio Saber PA-C

## 2023-01-13 ENCOUNTER — Ambulatory Visit: Payer: Medicare Other | Attending: Internal Medicine | Admitting: Internal Medicine

## 2023-01-13 ENCOUNTER — Encounter: Payer: Self-pay | Admitting: Internal Medicine

## 2023-01-13 VITALS — BP 118/70 | HR 69 | Ht 70.0 in | Wt 209.2 lb

## 2023-01-13 DIAGNOSIS — E785 Hyperlipidemia, unspecified: Secondary | ICD-10-CM

## 2023-01-13 DIAGNOSIS — Z01812 Encounter for preprocedural laboratory examination: Secondary | ICD-10-CM

## 2023-01-13 DIAGNOSIS — I4821 Permanent atrial fibrillation: Secondary | ICD-10-CM | POA: Diagnosis not present

## 2023-01-13 DIAGNOSIS — I495 Sick sinus syndrome: Secondary | ICD-10-CM

## 2023-01-13 DIAGNOSIS — I10 Essential (primary) hypertension: Secondary | ICD-10-CM

## 2023-01-13 DIAGNOSIS — I340 Nonrheumatic mitral (valve) insufficiency: Secondary | ICD-10-CM

## 2023-01-13 NOTE — Patient Instructions (Addendum)
Medication Instructions:  Your physician recommends that you continue on your current medications as directed. Please refer to the Current Medication list given to you today.  *If you need a refill on your cardiac medications before your next appointment, please call your pharmacy*  Lab Work: February 06, 2023: CBC, BMET If you have labs (blood work) drawn today and your tests are completely normal, you will receive your results only by: MyChart Message (if you have MyChart) OR A paper copy in the mail If you have any lab test that is abnormal or we need to change your treatment, we will call you to review the results.  Testing/Procedures: Your physician has requested that you have a TEE. During a TEE, sound waves are used to create images of your heart. It provides your doctor with information about the size and shape of your heart and how well your heart's chambers and valves are working. In this test, a transducer is attached to the end of a flexible tube that's guided down your throat and into your esophagus (the tube leading from you mouth to your stomach) to get a more detailed image of your heart. You are not awake for the procedure. Please see the instruction sheet given to you today. For further information please visit [email or web address]    Follow-Up: At [facility], you and your health needs are our priority.  As part of our continuing mission to provide you with exceptional heart care, we have created designated Provider Care Teams.  These Care Teams include your primary Cardiologist (physician) and Advanced Practice Providers (APPs -  Physician Assistants and Nurse Practitioners) who all work together to provide you with the care you need, when you need it.  We recommend signing up for the patient portal called "MyChart".  Sign up information is provided on this After Visit Summary.  MyChart is used to connect with patients for Virtual Visits (Telemedicine).  Patients are able to  view lab/test results, encounter notes, upcoming appointments, etc.  Non-urgent messages can be sent to your provider as well.   To learn more about what you can do with MyChart, go to [email or web address].    Your next appointment:   4-6 week(s)  The format for your next appointment:   In Person  Provider:   Alverda Skeans, MD{  Other Instructions   Dear [name] [name]  You are scheduled for a TEE (Transesophageal Echocardiogram) on Friday, November 1 with Dr. Royann Shivers.  Please arrive at the [facility] (Main Entrance A) at [facility]: [address] [place], [place] [address] at 10:00 AM (This time is 1 hour(s) before your procedure to ensure your preparation). Free valet parking service is available. You will check in at ADMITTING. The support person will be asked to wait in the waiting room.  It is OK to have someone drop you off and come back when you are ready to be discharged.     DIET:  Nothing to eat or drink after midnight except a sip of water with medications (see medication instructions below)  MEDICATION INSTRUCTIONS: !!IF ANY NEW MEDICATIONS ARE STARTED AFTER TODAY, PLEASE NOTIFY YOUR PROVIDER AS SOON AS POSSIBLE!!  FYI: Medications such as Semaglutide (Ozempic, [place]), Tirzepatide (Mounjaro, Zepbound), Dulaglutide (Trulicity), etc ("GLP1 agonists") AND Canagliflozin (Invokana), Dapagliflozin (Farxiga), Empagliflozin (Jardiance), Ertugliflozin (Steglatro), Bexagliflozin [employer]) or any combination with one of these drugs such as Invokamet (Canagliflozin/Metformin), Synjardy (Empagliflozin/Metformin), etc ("SGLT2 inhibitors") must be held around the time of a procedure. This is not  a comprehensive list of all of these drugs. Please review all of your medications and talk to your provider if you take any one of these. If you are not sure, ask your provider.   Continue taking your anticoagulant (blood thinner): Rivaroxaban (Xarelto).  You will need to continue this  after your procedure until you are told by your provider that it is safe to stop.    LABS: Come to the lab at [facility] at [address] between the hours of 8:00 am and 4:30 pm. You do NOT have to be fasting. You will have this done on Thursday February 06, 2023 when you come in for your nurse visit at 11:00 AM for an EKG.  FYI:  For your safety, and to allow [place] to monitor your vital signs accurately during the surgery/procedure we request: If you have artificial nails, gel coating, SNS etc, please have those removed prior to your surgery/procedure. Not having the nail coverings /polish removed may result in cancellation or delay of your surgery/procedure.  You must have a responsible person to drive you home and stay in the waiting area during your procedure. Failure to do so could result in cancellation.  Bring your insurance cards.  *Special Note: Every effort is made to have your procedure done on time. Occasionally there are emergencies that occur at the hospital that may cause delays. Please be patient if a delay does occur.
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
## 2023-01-13 NOTE — Progress Notes (Signed)
Patient ID: [name] [name] NO MRN: [id number] DOB/AGE: 08/27/1945 77 y.o.  Primary Care Physician:[name], [name] Heidelberg, MD Primary Cardiologist: None [name] [name]) Electrophysiologist: [name] Bunting, MD  FOCUSED CARDIOVASCULAR PROBLEM LIST:   Permanent atrial fibrillation; on Xarelto Atrial flutter ablation 2012 Hyperlipidemia Hypertension Sinus node dysfunction BMI 30 CKD stage II TIA 2011; thought related to atrial flutter  HISTORY OF PRESENT ILLNESS: The patient is a 77 y.o. male with the indicated medical history here for recommendations regarding mitral regurgitation.  The patient is usually followed by the EP service.  He was seen recently and was doing well aside from fatigue that he has noticed.  He had an echocardiogram done which demonstrated significant mitral regurgitation thought to be moderate to severe with a posterior wall hugging jet.  Patient also has at least moderate tricuspid regurgitation.  He has biatrial enlargement.    The patient is here to today by himself.  He is taking care of his wife who is recuperating from some health issues.  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  He has noticed increasing fatigue with activities that he could do last year without any issues.  For example he can no longer take a ditch like he could last year without having to stop and rest.  He has noticed no significant shortness of breath per se.  He does not really feel palpitations.  He has had no bleeding issues while on Eliquis.  He really has not noticed any severe peripheral edema and denies any paroxysmal nocturnal dyspnea.  He has not required any emergency room visits or hospitalizations.  He is somewhat limited from Achilles tendon issue in terms of ambulating at a rapid rate.  He does see a dentist on a regular basis.  He has a few root canals and fillings that need to be performed in the near future.  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  Past Medical  History:  Diagnosis Date   CVA (cerebral infarction) 07-14-09   Dysrhythmia    History of kidney stones    Hyperlipidemia    Hypertension    Nephrolithiasis    Paroxysmal atrial fibrillation (HCC)    Paroxysmal atrial flutter (HCC)    a. s/p ablation 2012.   Prostate cancer [facility])    Stroke [name])     Past Surgical History:  Procedure Laterality Date   APPENDECTOMY     BACK SURGERY     CYSTOSCOPY/URETEROSCOPY/HOLMIUM LASER/STENT PLACEMENT Left 09/19/2020   Procedure: CYSTOSCOPY RETROGRADE PYELOGRAM URETEROSCOPY/HOLMIUM LASER/STENT PLACEMENT;  Surgeon: Bjorn Pippin, MD;  Location: [facility];  Service: Urology;  Laterality: Left;   EYE SURGERY     PROSTATECTOMY  2005    Family History  Problem Relation Age of Onset   Heart disease Mother 69       died   Prostate cancer Father        alive   Testicular cancer Brother        died    Social History   Socioeconomic History   Marital status: Married    Spouse name: Not on file   Number of children: 2   Years of education: Not on file   Highest education level: Not on file  Occupational History   Occupation: Personnel officer: [employer]  Tobacco Use   Smoking status: Never   Smokeless tobacco: Never  Vaping Use   Vaping status: Never Used  Substance and  Sexual Activity   Alcohol use: No   Drug use: No   Sexual activity: Not on file  Other Topics Concern   Not on file  Social History Narrative   Not on file   Social Determinants of Health   Financial Resource Strain: Low Risk  (10/31/2022)   Overall Financial Resource Strain (CARDIA)    Difficulty of Paying Living Expenses: Not hard at all  Food Insecurity: No Food Insecurity (10/31/2022)   Hunger Vital Sign    Worried About Running Out of Food in the Last Year: Never true    Ran Out of Food in the Last Year: Never true  Transportation Needs: No Transportation Needs (10/31/2022)   PRAPARE - Administrator, Civil Service (Medical): No    Lack  of Transportation (Non-Medical): No  Physical Activity: Insufficiently Active (10/31/2022)   Exercise Vital Sign    Days of Exercise per Week: 3 days    Minutes of Exercise per Session: 20 min  Stress: No Stress Concern Present (10/31/2022)   [employer] of Occupational Health - Occupational Stress Questionnaire    Feeling of Stress : Not at all  Social Connections: Socially Integrated (10/31/2022)   Social Connection and Isolation Panel [NHANES]    Frequency of Communication with Friends and Family: More than three times a week    Frequency of Social Gatherings with Friends and Family: Three times a week    Attends Religious Services: More than 4 times per year    Active Member of Clubs or Organizations: Yes    Attends Banker Meetings: 1 to 4 times per year    Marital Status: Married  Catering manager Violence: Not At Risk (10/31/2022)   Humiliation, Afraid, Rape, and Kick questionnaire    Fear of Current or Ex-Partner: No    Emotionally Abused: No    Physically Abused: No    Sexually Abused: No     Prior to Admission medications   Medication Sig Start Date End Date Taking? Authorizing Provider  atorvastatin (LIPITOR) 20 MG tablet TAKE 1 TABLET(20 MG) BY MOUTH DAILY 05/20/22   [name], MD  diltiazem (CARDIZEM CD) 180 MG 24 hr capsule Take 1 capsule (180 mg total) by mouth daily. 01/09/23   Graciella Freer, PA-C  losartan (COZAAR) 50 MG tablet TAKE 1 TABLET(50 MG) BY MOUTH DAILY 07/24/22   [name], MD  rivaroxaban (XARELTO) 20 MG TABS tablet Take 1 tablet (20 mg total) by mouth daily with supper. 07/29/22   Marinus Maw, MD  traMADol (ULTRAM) 50 MG tablet TAKE 1 TABLET BY MOUTH EVERY 6 HOURS AS NEEDED. 09/04/22   [name], MD    No Known Allergies  REVIEW OF SYSTEMS:  General: no fevers/chills/night sweats Eyes: no blurry vision, diplopia, or amaurosis ENT: no sore throat or hearing loss Resp: no cough, wheezing, or  hemoptysis CV: no edema or palpitations GI: no abdominal pain, nausea, vomiting, diarrhea, or constipation GU: no dysuria, frequency, or hematuria Skin: no rash Neuro: no headache, numbness, tingling, or weakness of extremities Musculoskeletal: no joint pain or swelling Heme: no bleeding, DVT, or easy bruising Endo: no polydipsia or polyuria  BP 118/70 (BP Location: Left Arm, Patient Position: Sitting, Cuff Size: Normal)   Pulse 69   Ht 5\' 10"  (1.778 m)   Wt 209 lb 3.2 oz (94.9 kg)   SpO2 96%   BMI 30.02 kg/m   PHYSICAL EXAM: GEN:  AO x 3 in no  acute distress HEENT: normal Dentition: Normal Neck: JVP normal. +2 carotid upstrokes without bruits. No thyromegaly. Lungs: equal expansion, clear bilaterally CV: Apex is discrete and nondisplaced, regular rate and rhythm with 3 out of 6 holosystolic murmur best heard at the axilla Abd: soft, non-tender, non-distended; no bruit; positive bowel sounds Ext: no edema, ecchymoses, or cyanosis Vascular: 2+ femoral pulses, 2+ radial pulses       Skin: warm and dry without rash Neuro: CN II-XII grossly intact; motor and sensory grossly intact    DATA AND STUDIES:  EKG: EKG performed October 2024 that I personally reviewed demonstrates atrial fibrillation with rapid ventricular rate and incomplete right bundle branch block  EKG Interpretation Date/Time:    Ventricular Rate:    PR Interval:    QRS Duration:    QT Interval:    QTC Calculation:   R Axis:      Text Interpretation:          Cardiac Studies & Procedures       ECHOCARDIOGRAM  ECHOCARDIOGRAM COMPLETE 12/31/2022  Narrative ECHOCARDIOGRAM REPORT    Patient Name:   [name] [name] Date of Exam: 12/31/2022 Medical Rec #:  [id number]      Height:       70.0 in Accession #:    [id number]     Weight:       214.2 lb Date of Birth:  September 09, 1945       BSA:          2.149 m Patient Age:    77 years       BP:           132/82 mmHg Patient Gender: M              HR:            94 bpm. Exam Location:  [address]  Procedure: 2D Echo, Cardiac Doppler and Color Doppler  Indications:    R06.00 SOB  History:        Patient has prior history of Echocardiogram examinations, most recent 07/11/2009. Stroke, Arrythmias:Atrial Fibrillation, Signs/Symptoms:Shortness of Breath and Fatigue; Risk Factors:Hypertension and Dyslipidemia.  Sonographer:    Samule Ohm RDCS Referring Phys: [id number] John T [name]  IMPRESSIONS   1. Left ventricular ejection fraction, by estimation, is 45 to 50% with beat to beat variability. The left ventricle has mildly decreased function. The left ventricle demonstrates global hypokinesis. There is moderate asymmetric left ventricular hypertrophy of the infero-lateral segment. Left ventricular diastolic parameters are indeterminate. 2. Right ventricular systolic function is mildly reduced. The right ventricular size is normal. There is normal pulmonary artery systolic pressure. The estimated right ventricular systolic pressure is 27.6 mmHg. 3. Left atrial size was severely dilated. 4. Right atrial size was moderately dilated. 5. Mitral valve bowing and moderate-severe anteriorly directed MR. Clip 72-73 demonstrate probable posterior leaflet P2 scallop prolapse as the source of the eccentric jet. Splay artifact present, suggesting MR may be underestimated. The mitral valve is abnormal. Moderate to severe mitral valve regurgitation. No evidence of mitral stenosis. 6. Tricuspid valve regurgitation is moderate. 7. The aortic valve is tricuspid. There is mild thickening of the aortic valve. Aortic valve regurgitation is not visualized. No aortic stenosis is present. 8. Pulmonic valve regurgitation is moderate. 9. Aortic dilatation noted. There is borderline dilatation of the ascending aorta, measuring 43 mm. 10. The inferior vena cava is normal in size with greater than 50% respiratory variability, suggesting right atrial pressure of 3  mmHg.  Conclusion(s)/Recommendation(s): Consider TEE to further define mitral valve anatomy.  FINDINGS Left Ventricle: Left ventricular ejection fraction, by estimation, is 45 to 50%. The left ventricle has mildly decreased function. The left ventricle demonstrates global hypokinesis. The left ventricular internal cavity size was normal in size. There is moderate asymmetric left ventricular hypertrophy of the infero-lateral segment. Left ventricular diastolic parameters are indeterminate.  Right Ventricle: The right ventricular size is normal. No increase in right ventricular wall thickness. Right ventricular systolic function is mildly reduced. There is normal pulmonary artery systolic pressure. The tricuspid regurgitant velocity is 2.48 m/s, and with an assumed right atrial pressure of 3 mmHg, the estimated right ventricular systolic pressure is 27.6 mmHg.  Left Atrium: Left atrial size was severely dilated.  Right Atrium: Right atrial size was moderately dilated.  Pericardium: There is no evidence of pericardial effusion.  Mitral Valve: Mitral valve bowing and moderate-severe anteriorly directed MR. Clip 72-73 demonstrate probable posterior leaflet P2 scallop prolapse as the source of the eccentric jet. Splay artifact present, suggesting MR may be underestimated. The mitral valve is abnormal. Moderate to severe mitral valve regurgitation. No evidence of mitral valve stenosis. The mean mitral valve gradient is 2.3 mmHg with average heart rate of 102 bpm.  Tricuspid Valve: The tricuspid valve is normal in structure. Tricuspid valve regurgitation is moderate . No evidence of tricuspid stenosis.  Aortic Valve: The aortic valve is tricuspid. There is mild thickening of the aortic valve. Aortic valve regurgitation is not visualized. No aortic stenosis is present. Aortic valve mean gradient measures 4.4 mmHg. Aortic valve peak gradient measures 8.7 mmHg. Aortic valve area, by VTI measures 1.59  cm.  Pulmonic Valve: The pulmonic valve was normal in structure. Pulmonic valve regurgitation is moderate. No evidence of pulmonic stenosis.  Aorta: Aortic dilatation noted and the aortic root is normal in size and structure. There is borderline dilatation of the ascending aorta, measuring 43 mm.  Venous: The inferior vena cava is normal in size with greater than 50% respiratory variability, suggesting right atrial pressure of 3 mmHg.  IAS/Shunts: No atrial level shunt detected by color flow Doppler.   LEFT VENTRICLE PLAX 2D LVIDd:         5.30 cm   Diastology LVIDs:         3.70 cm   LV e' medial:    13.40 cm/s LV PW:         1.40 cm   LV E/e' medial:  8.0 LV IVS:        1.20 cm   LV e' lateral:   14.75 cm/s LVOT diam:     2.20 cm   LV E/e' lateral: 7.2 LV SV:         37 LV SV Index:   17 LVOT Area:     3.80 cm   RIGHT VENTRICLE            IVC RVSP:           27.6 mmHg  IVC diam: 1.60 cm  LEFT ATRIUM            Index        RIGHT ATRIUM           Index LA diam:      5.10 cm  2.37 cm/m   RA Pressure: 3.00 mmHg LA Vol (A2C): 86.2 ml  40.11 ml/m  RA Area:     28.50 cm LA Vol (A4C): 102.0 ml 47.46 ml/m  RA Volume:   96.60  ml  44.95 ml/m AORTIC VALVE AV Area (Vmax):    1.71 cm AV Area (Vmean):   1.74 cm AV Area (VTI):     1.59 cm AV Vmax:           147.60 cm/s AV Vmean:          97.791 cm/s AV VTI:            0.232 m AV Peak Grad:      8.7 mmHg AV Mean Grad:      4.4 mmHg LVOT Vmax:         66.44 cm/s LVOT Vmean:        44.700 cm/s LVOT VTI:          0.097 m LVOT/AV VTI ratio: 0.42  AORTA Ao Root diam: 3.90 cm Ao Asc diam:  4.30 cm  MITRAL VALVE                TRICUSPID VALVE MV Area (PHT): 4.18 cm     TR Peak grad:   24.6 mmHg MV Mean grad:  2.3 mmHg     TR Vmax:        248.00 cm/s MV Decel Time: 181 msec     Estimated RAP:  3.00 mmHg MV E velocity: 106.60 cm/s  RVSP:           27.6 mmHg  SHUNTS Systemic VTI:  0.10 m Systemic Diam: 2.20 cm  Weston Brass MD Electronically signed by Weston Brass MD Signature Date/Time: 12/31/2022/10:04:28 PM    Final    MONITORS  LONG TERM MONITOR (3-14 DAYS) 09/03/2022  Narrative Atrial fib with a slow VR (down to 45/min), controlled VR (ave HR 98/min) and RVR with max HR of 184/min. Rare PVC's with couplets No VT No prolonged pauses  Gregg Taylor,MD  Patch Wear Time:  8 days and 8 hours (2024-05-10T22:29:43-0400 to 2024-05-19T06:38:08-398)  Atrial Fibrillation occurred continuously (100% burden), ranging from 45-184 bpm (avg of 98 bpm). Isolated VEs were rare (<1.0%), VE Couplets were rare (<1.0%), and no VE Triplets were present. Ventricular Trigeminy was present.           05/20/2022: TSH 1.89 12/06/2022: ALT 19; BUN 18; Creat 1.08; Hemoglobin 15.4; Platelets 226; Potassium 4.1; Sodium 141   STS RISK CALCULATOR: Pending  NHYA CLASS: 2     ASSESSMENT AND PLAN:   Mitral valve insufficiency, unspecified etiology  Permanent atrial fibrillation (HCC) - Plan: CBC  Sinus node dysfunction (HCC)  Essential hypertension - Plan: Basic metabolic panel  Dyslipidemia  Pre-procedure lab exam - Plan: CBC, Basic metabolic panel  The patient has developed NYHA II symptoms of fatigue but interestingly no shortness of breath.  I consider potentially putting the patient on a treadmill to elicit symptoms of shortness of breath and objectively assess his exercise capacity however because of a Achilles tendon issue this is not possible.  I reviewed the patient's TEE with the patient.  He has at least moderate to severe mitral regurgitation.  Is unclear to me exactly what the mechanism is.  Given the fact that is eccentric and while hugging this may suggest at least a partially degenerative issue.  He has biatrial enlargement as well which could suggest an element of functional mitral regurgitation as well.  At this point in time we have decided to proceed with a TEE.  I will have him return to  meet with me after this has been performed to decide about further evaluation and treatment options.  Hopefully  he will have his dental work done by this time as well.  I have personally reviewed the patients imaging data as summarized above.  I have reviewed the natural history of mitral regurgitation with the patient and family members who are present today. We have discussed the limitations of medical therapy and the poor prognosis associated with symptomatic mitral regurgitation. We have also reviewed potential treatment options, including palliative medical therapy, conventional mitral surgery, and transcatheter mitral edge-to-edge repair. We discussed treatment options in the context of this patient's specific comorbid medical conditions.   All of the patient's questions were answered today. Will make further recommendations based on the results of studies outlined above.   Total time spent with patient today 60 minutes. This includes reviewing records, evaluating the patient and coordinating care.   [name], MD  01/13/2023 12:48 PM    [facility] [address] [place], [place], [place]  [address] Phone: [phone]; Fax: [phone]

## 2023-01-25 ENCOUNTER — Other Ambulatory Visit: Payer: Self-pay | Admitting: Internal Medicine

## 2023-01-25 DIAGNOSIS — I4891 Unspecified atrial fibrillation: Secondary | ICD-10-CM

## 2023-01-27 ENCOUNTER — Other Ambulatory Visit: Payer: Self-pay | Admitting: Family Medicine

## 2023-01-27 ENCOUNTER — Ambulatory Visit: Payer: Medicare Other | Admitting: Family Medicine

## 2023-01-27 DIAGNOSIS — I10 Essential (primary) hypertension: Secondary | ICD-10-CM

## 2023-01-27 NOTE — Telephone Encounter (Signed)
Prescription refill request for Xarelto received.  Indication:afib Last office visit:10/24 Weight:94.9  kg Age:77 Scr:1.08  8/24 CrCl:74.45  ml/min  Prescription refilled

## 2023-01-28 ENCOUNTER — Ambulatory Visit: Payer: Medicare Other | Admitting: Family Medicine

## 2023-01-28 NOTE — Telephone Encounter (Signed)
Last OV 11/21/22 within protocol.  Requested Prescriptions  Pending Prescriptions Disp Refills   losartan (COZAAR) 50 MG tablet [Pharmacy Med Name: Losartan Potassium 50 MG Oral Tablet] 90 tablet 0    Sig: Take 1 tablet by mouth once daily     Cardiovascular:  Angiotensin Receptor Blockers Failed - 01/27/2023 10:46 AM      Failed - Valid encounter within last 6 months    Recent Outpatient Visits           1 year ago Atrial fibrillation, unspecified type (HCC)   [facility] [name], [name], MD   2 years ago Right sided sciatica   [facility] [name], MD   2 years ago Atrial fibrillation, unspecified type [facility])   [facility] [name], [name], MD   3 years ago [place] vision, bilateral   [facility] [name], [name], MD   4 years ago Routine general medical examination at a health care facility   [facility] [name], [name], MD       Future Appointments             In 2 weeks [name], MD [facility]   In 2 months Lanna Poche, Mariam Dollar, PA-C [facility], [facility]            Passed - Cr in normal range and within 180 days    Creat  Date Value Ref Range Status  12/06/2022 1.08 0.70 - 1.28 mg/dL Final         Passed - K in normal range and within 180 days    Potassium  Date Value Ref Range Status  12/06/2022 4.1 3.5 - 5.3 mmol/L Final         Passed - Patient is not pregnant      Passed - Last BP in normal range    BP Readings from Last 1 Encounters:  01/13/23 118/70

## 2023-02-06 ENCOUNTER — Ambulatory Visit: Payer: Medicare Other | Attending: Internal Medicine

## 2023-02-06 ENCOUNTER — Ambulatory Visit: Payer: Medicare Other

## 2023-02-06 VITALS — HR 107 | Ht 70.0 in | Wt 209.0 lb

## 2023-02-06 DIAGNOSIS — I340 Nonrheumatic mitral (valve) insufficiency: Secondary | ICD-10-CM | POA: Diagnosis not present

## 2023-02-06 DIAGNOSIS — I10 Essential (primary) hypertension: Secondary | ICD-10-CM

## 2023-02-06 DIAGNOSIS — Z01812 Encounter for preprocedural laboratory examination: Secondary | ICD-10-CM | POA: Diagnosis not present

## 2023-02-06 DIAGNOSIS — I4821 Permanent atrial fibrillation: Secondary | ICD-10-CM

## 2023-02-06 NOTE — Progress Notes (Signed)
   Nurse Visit   Date of Encounter: 02/06/2023 ID: [name] [name], DOB [birth year], MRN [id number]  PCP:  [name], MD   [facility] Providers Cardiologist:  None Electrophysiologist:  Lewayne Bunting, MD      Visit Details   VS:  Pulse (!) 107   Ht 5\' 10"  (1.778 m)   Wt 209 lb (94.8 kg)   BMI 29.99 kg/m  , BMI Body mass index is 29.99 kg/m.  Wt Readings from Last 3 Encounters:  02/06/23 209 lb (94.8 kg)  01/13/23 209 lb 3.2 oz (94.9 kg)  01/09/23 211 lb (95.7 kg)     Reason for visit: EKG Performed today: EKG, Provider consulted:DOD, Dr. Ladona Ridgel, and Education Changes (medications, testing, etc.) : None Length of Visit: 15 minutes  Per Dr. Ladona Ridgel, EKG shows Atrial Fibrillation. Continue with current plan.   Medications Adjustments/Labs and Tests Ordered: Orders Placed This Encounter  Procedures   EKG 12-Lead   No orders of the defined types were placed in this encounter.    Margart Sickles, RN  02/06/2023 11:20 AM

## 2023-02-07 ENCOUNTER — Ambulatory Visit (HOSPITAL_COMMUNITY)
Admission: RE | Admit: 2023-02-07 | Discharge: 2023-02-07 | Disposition: A | Discharge status: Home / Self Care | Payer: Medicare Other | Attending: Cardiovascular Disease | Admitting: Cardiovascular Disease

## 2023-02-07 ENCOUNTER — Encounter (HOSPITAL_COMMUNITY)
Admission: RE | Disposition: A | Discharge status: Home / Self Care | Payer: Self-pay | Source: Home / Self Care | Attending: Cardiovascular Disease

## 2023-02-07 ENCOUNTER — Other Ambulatory Visit: Payer: Self-pay

## 2023-02-07 ENCOUNTER — Ambulatory Visit (HOSPITAL_COMMUNITY): Payer: Medicare Other | Admitting: Anesthesiology

## 2023-02-07 ENCOUNTER — Ambulatory Visit (HOSPITAL_BASED_OUTPATIENT_CLINIC_OR_DEPARTMENT_OTHER)
Admission: RE | Admit: 2023-02-07 | Discharge: 2023-02-07 | Disposition: A | Discharge status: Home / Self Care | Payer: Medicare Other | Source: Ambulatory Visit | Attending: Cardiovascular Disease | Admitting: Cardiovascular Disease

## 2023-02-07 DIAGNOSIS — I130 Hypertensive heart and chronic kidney disease with heart failure and stage 1 through stage 4 chronic kidney disease, or unspecified chronic kidney disease: Secondary | ICD-10-CM | POA: Diagnosis not present

## 2023-02-07 DIAGNOSIS — I340 Nonrheumatic mitral (valve) insufficiency: Secondary | ICD-10-CM

## 2023-02-07 DIAGNOSIS — I509 Heart failure, unspecified: Secondary | ICD-10-CM | POA: Insufficient documentation

## 2023-02-07 DIAGNOSIS — I4892 Unspecified atrial flutter: Secondary | ICD-10-CM | POA: Diagnosis not present

## 2023-02-07 DIAGNOSIS — N182 Chronic kidney disease, stage 2 (mild): Secondary | ICD-10-CM | POA: Diagnosis not present

## 2023-02-07 DIAGNOSIS — E785 Hyperlipidemia, unspecified: Secondary | ICD-10-CM | POA: Insufficient documentation

## 2023-02-07 DIAGNOSIS — I4821 Permanent atrial fibrillation: Secondary | ICD-10-CM | POA: Diagnosis not present

## 2023-02-07 DIAGNOSIS — Z7901 Long term (current) use of anticoagulants: Secondary | ICD-10-CM | POA: Diagnosis not present

## 2023-02-07 DIAGNOSIS — I361 Nonrheumatic tricuspid (valve) insufficiency: Secondary | ICD-10-CM | POA: Diagnosis not present

## 2023-02-07 DIAGNOSIS — I083 Combined rheumatic disorders of mitral, aortic and tricuspid valves: Secondary | ICD-10-CM | POA: Insufficient documentation

## 2023-02-07 DIAGNOSIS — I495 Sick sinus syndrome: Secondary | ICD-10-CM | POA: Diagnosis not present

## 2023-02-07 DIAGNOSIS — Z8673 Personal history of transient ischemic attack (TIA), and cerebral infarction without residual deficits: Secondary | ICD-10-CM | POA: Insufficient documentation

## 2023-02-07 DIAGNOSIS — I4891 Unspecified atrial fibrillation: Secondary | ICD-10-CM | POA: Diagnosis not present

## 2023-02-07 HISTORY — PX: TEE WITHOUT CARDIOVERSION: SHX5443

## 2023-02-07 LAB — BASIC METABOLIC PANEL
BUN/Creatinine Ratio: 18 (ref 10–24)
BUN: 18 mg/dL (ref 8–27)
CO2: 20 mmol/L (ref 20–29)
Calcium: 9 mg/dL (ref 8.6–10.2)
Chloride: 106 mmol/L (ref 96–106)
Creatinine, Ser: 1 mg/dL (ref 0.76–1.27)
Glucose: 128 mg/dL — ABNORMAL HIGH (ref 70–99)
Potassium: 4 mmol/L (ref 3.5–5.2)
Sodium: 140 mmol/L (ref 134–144)
eGFR: 77 mL/min/{1.73_m2} (ref >59)

## 2023-02-07 LAB — ECHO TEE: MV Vena cont: 0.4 cm

## 2023-02-07 LAB — CBC
Hematocrit: 44.8 % (ref 37.5–51.0)
Hemoglobin: 14.6 g/dL (ref 13.0–17.7)
MCH: 30.6 pg (ref 26.6–33.0)
MCHC: 32.6 g/dL (ref 31.5–35.7)
MCV: 94 fL (ref 79–97)
Platelets: 228 10*3/uL (ref 150–450)
RBC: 4.77 x10E6/uL (ref 4.14–5.80)
RDW: 12.1 % (ref 11.6–15.4)
WBC: 5.7 10*3/uL (ref 3.4–10.8)

## 2023-02-07 SURGERY — ECHOCARDIOGRAM, TRANSESOPHAGEAL
Anesthesia: Monitor Anesthesia Care

## 2023-02-07 MED ORDER — SODIUM CHLORIDE 0.9 % IV SOLN: INTRAVENOUS | Status: DC | PRN

## 2023-02-07 MED ORDER — PROPOFOL 10 MG/ML IV BOLUS
INTRAVENOUS | Status: DC | PRN
  Administered 2023-02-07 (×5): 20 mg via INTRAVENOUS

## 2023-02-07 MED ORDER — PHENYLEPHRINE 80 MCG/ML (10ML) SYRINGE FOR IV PUSH (FOR BLOOD PRESSURE SUPPORT)
PREFILLED_SYRINGE | INTRAVENOUS | Status: DC | PRN
  Administered 2023-02-07: 120 ug via INTRAVENOUS

## 2023-02-07 MED ORDER — PROPOFOL 500 MG/50ML IV EMUL
INTRAVENOUS | Status: DC | PRN
  Administered 2023-02-07: 125 ug/kg/min via INTRAVENOUS

## 2023-02-07 MED ORDER — LIDOCAINE HCL (CARDIAC) PF 100 MG/5ML IV SOSY
PREFILLED_SYRINGE | INTRAVENOUS | Status: DC | PRN
  Administered 2023-02-07: 50 mg via INTRAVENOUS

## 2023-02-07 MED ORDER — DILTIAZEM HCL ER COATED BEADS 240 MG PO CP24: 240.0000 mg | ORAL_CAPSULE | Freq: Every day | ORAL | 3 refills | Status: DC

## 2023-02-07 MED ORDER — BUTAMBEN-TETRACAINE-BENZOCAINE 2-2-14 % EX AERO
INHALATION_SPRAY | CUTANEOUS | Status: DC | PRN
  Administered 2023-02-07: 1 via TOPICAL

## 2023-02-07 NOTE — Progress Notes (Signed)
Patient arrived for his TEE.  His HR ranging 100-140's.  EKG performed shows afib.  Dr Davy Pique saw patient now planning to do a cardioversion with his TEE.  Patient verbalized understanding.

## 2023-02-07 NOTE — Interval H&P Note (Signed)
History and Physical Interval Note:  02/07/2023 10:04 AM  [name] [name]  has presented today for surgery, with the diagnosis of MITRAL VALVE INSUFFICIENCY.  The various methods of treatment have been discussed with the patient and family. After consideration of risks, benefits and other options for treatment, the patient has consented to  Procedure(s): TRANSESOPHAGEAL ECHOCARDIOGRAM (N/A) as a surgical intervention.  The patient's history has been reviewed, patient examined, no change in status, stable for surgery.  I have reviewed the patient's chart and labs.  Questions were answered to the patient's satisfaction.     Kylan Liberati

## 2023-02-07 NOTE — Progress Notes (Signed)
  Echocardiogram Echocardiogram Transesophageal has been performed.  [name] [name] 02/07/2023, 11:14 AM

## 2023-02-07 NOTE — Op Note (Signed)
INDICATIONS: Mitral insufficiency  PROCEDURE:   Informed consent was obtained prior to the procedure. The risks, benefits and alternatives for the procedure were discussed and the patient comprehended these risks.  Risks include, but are not limited to, cough, sore throat, vomiting, nausea, somnolence, esophageal and stomach trauma or perforation, bleeding, low blood pressure, aspiration, pneumonia, infection, trauma to the teeth and death.    After a procedural time-out, the oropharynx was anesthetized with 20% benzocaine spray.   During this procedure the patient was administered IV propofol by Anesthesiology, Dr. Salvadore Farber.  The transesophageal probe was inserted in the esophagus and stomach with some difficulty and multiple views were obtained.  The patient was kept under observation until the patient left the procedure room.  The patient left the procedure room in stable condition.   Agitated microbubble saline contrast was not administered.  COMPLICATIONS:    There were no immediate complications.  FINDINGS: Challenging intubation. Difficult study due to tachycardia (AFib with rates 150 bpm).  Borderline low LV EF, approximately 50%, with global hypokinesis. Biatrial dilation. Myxomatous mitral valve with bileaflet prolapse, most prominent at the level of P2. There are 2 separate jets of mitral insufficiency, most prominent at the lateral aspect of P2, with a highly eccentric jet of anteriorly directed MR. The other MR jet is at the medial side of P2 and is a central jet. Overall there appears to be moderate to severe MR. There is systolic forward flow in both right and left pulmonary veins. MR ERO calculations are likely to be inaccurate due to rapid AFib. Also has a myxomatous tricuspid valve with moderate regurgitation. Pulmonary artery pressures are normal.  RECOMMENDATIONS:     Evaluated for surgical versus percutaneous MV repair (he may be a good candidate for either  approach).  Time Spent Directly with the Patient:  45 minutes   [name] [name] 02/07/2023, 11:00 AM

## 2023-02-07 NOTE — Anesthesia Postprocedure Evaluation (Signed)
Anesthesia Post Note  Patient: [name] [name]  Procedure(s) Performed: TRANSESOPHAGEAL ECHOCARDIOGRAM     Patient location during evaluation: PACU Anesthesia Type: MAC Level of consciousness: awake and alert Pain management: pain level controlled Vital Signs Assessment: post-procedure vital signs reviewed and stable Respiratory status: spontaneous breathing, nonlabored ventilation and respiratory function stable Cardiovascular status: blood pressure returned to baseline and stable Postop Assessment: no apparent nausea or vomiting Anesthetic complications: no   No notable events documented.  Last Vitals:  Vitals:   02/07/23 1120 02/07/23 1133  BP: 107/81   Pulse: 82 86  Resp: 17 11  Temp:    SpO2: (!) 89% 92%    Last Pain:  Vitals:   02/07/23 1115  TempSrc: Temporal  PainSc: 0-No pain                 Lannie Fields

## 2023-02-07 NOTE — Anesthesia Preprocedure Evaluation (Addendum)
Anesthesia Evaluation  Patient identified by MRN, date of birth, ID band Patient awake    Reviewed: Allergy & Precautions, NPO status , Patient's Chart, lab work & pertinent test results  Airway Mallampati: II  TM Distance: >3 FB Neck ROM: Full    Dental no notable dental hx.    Pulmonary neg pulmonary ROS   Pulmonary exam normal breath sounds clear to auscultation       Cardiovascular hypertension, Pt. on medications +CHF (LVEF 45-50%)  + dysrhythmias (s/p ablation 2012, xarelto) Atrial Fibrillation + Valvular Problems/Murmurs (mod- severe MR, mod PI) MR  Rhythm:Irregular Rate:Tachycardia  Echo 12/31/22 1. Left ventricular ejection fraction, by estimation, is 45 to 50% with  beat to beat variability. The left ventricle has mildly decreased  function. The left ventricle demonstrates global hypokinesis. There is  moderate asymmetric left ventricular  hypertrophy of the infero-lateral segment. Left ventricular diastolic  parameters are indeterminate.   2. Right ventricular systolic function is mildly reduced. The right  ventricular size is normal. There is normal pulmonary artery systolic  pressure. The estimated right ventricular systolic pressure is 27.6 mmHg.   3. Left atrial size was severely dilated.   4. Right atrial size was moderately dilated.   5. Mitral valve bowing and moderate-severe anteriorly directed MR. Clip  72-73 demonstrate probable posterior leaflet P2 scallop prolapse as the  source of the eccentric jet. Splay artifact present, suggesting MR may be  underestimated. The mitral valve is abnormal. Moderate to severe mitral valve regurgitation. No evidence of mitral stenosis.   6. Tricuspid valve regurgitation is moderate.   7. The aortic valve is tricuspid. There is mild thickening of the aortic  valve. Aortic valve regurgitation is not visualized. No aortic stenosis is  present.   8. Pulmonic valve regurgitation  is moderate.   9. Aortic dilatation noted. There is borderline dilatation of the  ascending aorta, measuring 43 mm.  10. The inferior vena cava is normal in size with greater than 50%  respiratory variability, suggesting right atrial pressure of 3 mmHg.   Conclusion(s)/Recommendation(s): Consider TEE to further define mitral  valve anatomy.     Neuro/Psych CVA (2011)  negative psych ROS   GI/Hepatic negative GI ROS, Neg liver ROS,,,  Endo/Other  negative endocrine ROS    Renal/GU negative Renal ROS  negative genitourinary   Musculoskeletal negative musculoskeletal ROS (+)    Abdominal   Peds  Hematology negative hematology ROS (+)   Anesthesia Other Findings   Reproductive/Obstetrics negative OB ROS                             Anesthesia Physical Anesthesia Plan  ASA: 4  Anesthesia Plan: MAC   Post-op Pain Management:    Induction:   PONV Risk Score and Plan: 2 and Propofol infusion and TIVA  Airway Management Planned: Natural Airway and Simple Face Mask  Additional Equipment: None  Intra-op Plan:   Post-operative Plan:   Informed Consent: I have reviewed the patients History and Physical, chart, labs and discussed the procedure including the risks, benefits and alternatives for the proposed anesthesia with the patient or authorized representative who has indicated his/her understanding and acceptance.       Plan Discussed with: CRNA  Anesthesia Plan Comments:        Anesthesia Quick Evaluation

## 2023-02-07 NOTE — Transfer of Care (Signed)
Immediate Anesthesia Transfer of Care Note  Patient: [name] [name]  Procedure(s) Performed: TRANSESOPHAGEAL ECHOCARDIOGRAM  Patient Location: PACU  Anesthesia Type:General  Level of Consciousness: drowsy  Airway & Oxygen Therapy: Patient Spontanous Breathing and Patient connected to face mask oxygen  Post-op Assessment: Report given to RN and Post -op Vital signs reviewed and stable  Post vital signs: Reviewed and stable  Last Vitals:  Vitals Value Taken Time  BP 101/73 1106  Temp    Pulse 97   Resp 24   SpO2 94     Last Pain:  Vitals:   02/07/23 1020  TempSrc:   PainSc: 0-No pain         Complications: No notable events documented.

## 2023-02-10 ENCOUNTER — Encounter (HOSPITAL_COMMUNITY): Payer: Self-pay | Admitting: Cardiovascular Disease

## 2023-02-10 NOTE — H&P (View-Only) (Signed)
 Patient ID: [name] [name] MRN: [id number] DOB/AGE: 79-09-1945 79 y.o.  Primary Care Physician:[name], [name], MD Primary Cardiologist: None [name]) Electrophysiologist: Lewayne Bunting, MD  FOCUSED CARDIOVASCULAR PROBLEM LIST:   Permanent atrial fibrillation; on Xarelto Atrial flutter ablation 2012 Hyperlipidemia Hypertension Sinus node dysfunction BMI 30 CKD stage II TIA 2011; thought related to atrial flutter Mitral regurgitation Moderate to severe TTE 2024 Tricuspid regurgitation Moderate TTE 2024  HISTORY OF PRESENT ILLNESS:  October 2024: The patient is a 77 y.o. male with the indicated medical history here for recommendations regarding mitral regurgitation.  The patient is usually followed by the EP service.  He was seen recently and was doing well aside from fatigue that he has noticed.  He had an echocardiogram done which demonstrated significant mitral regurgitation thought to be moderate to severe with a posterior wall hugging jet.  Patient also has at least moderate tricuspid regurgitation.  He has biatrial enlargement.    The patient is here to today by himself.  He is taking care of his wife who is recuperating from some health issues.  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  He has noticed increasing fatigue with activities that he could do last year without any issues.  For example he can no longer take a ditch like he could last year without having to stop and rest.  He has noticed no significant shortness of breath per se.  He does not really feel palpitations.  He has had no bleeding issues while on Eliquis.  He really has not noticed any severe peripheral edema and denies any paroxysmal nocturnal dyspnea.  He has not required any emergency room visits or hospitalizations.  He is somewhat limited from Achilles tendon issue in terms of ambulating at a rapid rate.  He does see a dentist on a regular basis.  He has a few root canals and fillings that need to be  performed in the near future.  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  Plan: Refer for TEE.  November 2024: In the interim the patient had a TEE which demonstrated severe mitral regurgitation due to P2 prolapse.Marland Kitchen  He also has moderate tricuspid regurgitation.  His ejection fraction was mildly reduced.  He had a limited echocardiogram today which demonstrated continued severe degenerative mitral regurgitation at least moderate tricuspid regurgitation.  He continues to do well.  He has noted some lightheadedness when he goes from sitting to standing quickly.  He is not feeling shortness of breath or palpitations.  He denies any heart failure symptoms.  He has had no severe bleeding or bruising.  He has not required any emergency room visits or hospitalizations.  He is still caring for his wife who is getting over orthopedic issues and may require another procedure.  Past Medical History:  Diagnosis Date   CVA (cerebral infarction) 07-14-09   Dysrhythmia    History of kidney stones    Hyperlipidemia    Hypertension    Nephrolithiasis    Paroxysmal atrial fibrillation (HCC)    Paroxysmal atrial flutter (HCC)    a. s/p ablation 2012.   Prostate cancer [facility])    Stroke [facility])     Past Surgical History:  Procedure Laterality Date   APPENDECTOMY     BACK SURGERY     CYSTOSCOPY/URETEROSCOPY/HOLMIUM LASER/STENT PLACEMENT Left 09/19/2020   Procedure: CYSTOSCOPY RETROGRADE PYELOGRAM URETEROSCOPY/HOLMIUM LASER/STENT PLACEMENT;  Surgeon: Bjorn Pippin, MD;  Location: [facility] ORS;  Service: Urology;  Laterality: Left;   EYE SURGERY     PROSTATECTOMY  2005   TEE WITHOUT CARDIOVERSION N/A 02/07/2023   Procedure: TRANSESOPHAGEAL ECHOCARDIOGRAM;  Surgeon: Thurmon Fair, MD;  Location: [facility] INVASIVE CV LAB;  Service: Cardiovascular;  Laterality: N/A;    Family History  Problem Relation Age of Onset   Heart disease Mother 57       died   Prostate cancer Father        alive   Testicular  cancer Brother        died    Social History   Socioeconomic History   Marital status: Married    Spouse name: Not on file   Number of children: 2   Years of education: Not on file   Highest education level: Not on file  Occupational History   Occupation: Personnel officer: [employer]  Tobacco Use   Smoking status: Never   Smokeless tobacco: Never  Vaping Use   Vaping status: Never Used  Substance and Sexual Activity   Alcohol use: No   Drug use: No   Sexual activity: Not on file  Other Topics Concern   Not on file  Social History Narrative   Not on file   Social Determinants of Health   Financial Resource Strain: Low Risk  (10/31/2022)   Overall Financial Resource Strain (CARDIA)    Difficulty of Paying Living Expenses: Not hard at all  Food Insecurity: No Food Insecurity (10/31/2022)   Hunger Vital Sign    Worried About Running Out of Food in the Last Year: Never true    Ran Out of Food in the Last Year: Never true  Transportation Needs: No Transportation Needs (10/31/2022)   PRAPARE - Administrator, Civil Service (Medical): No    Lack of Transportation (Non-Medical): No  Physical Activity: Insufficiently Active (10/31/2022)   Exercise Vital Sign    Days of Exercise per Week: 3 days    Minutes of Exercise per Session: 20 min  Stress: No Stress Concern Present (10/31/2022)   [employer] of Occupational Health - Occupational Stress Questionnaire    Feeling of Stress : Not at all  Social Connections: Socially Integrated (10/31/2022)   Social Connection and Isolation Panel [NHANES]    Frequency of Communication with Friends and Family: More than three times a week    Frequency of Social Gatherings with Friends and Family: Three times a week    Attends Religious Services: More than 4 times per year    Active Member of Clubs or Organizations: Yes    Attends Banker Meetings: 1 to 4 times per year    Marital Status: Married   Catering manager Violence: Not At Risk (10/31/2022)   Humiliation, Afraid, Rape, and Kick questionnaire    Fear of Current or Ex-Partner: No    Emotionally Abused: No    Physically Abused: No    Sexually Abused: No     Prior to Admission medications   Medication Sig Start Date End Date Taking? Authorizing Provider  atorvastatin (LIPITOR) 20 MG tablet TAKE 1 TABLET(20 MG) BY MOUTH DAILY 05/20/22   [name], MD  diltiazem (CARDIZEM CD) 180 MG 24 hr capsule Take 1 capsule (180 mg total) by mouth daily. 01/09/23   Graciella Freer, PA-C  losartan (COZAAR) 50 MG tablet TAKE 1 TABLET(50 MG) BY MOUTH DAILY 07/24/22   [name], MD  rivaroxaban (XARELTO) 20 MG TABS tablet Take 1 tablet (  20 mg total) by mouth daily with supper. 07/29/22   Marinus Maw, MD  traMADol (ULTRAM) 50 MG tablet TAKE 1 TABLET BY MOUTH EVERY 6 HOURS AS NEEDED. 09/04/22   [name], MD    No Known Allergies  REVIEW OF SYSTEMS:  General: no fevers/chills/night sweats Eyes: no blurry vision, diplopia, or amaurosis ENT: no sore throat or hearing loss Resp: no cough, wheezing, or hemoptysis CV: no edema or palpitations GI: no abdominal pain, nausea, vomiting, diarrhea, or constipation GU: no dysuria, frequency, or hematuria Skin: no rash Neuro: no headache, numbness, tingling, or weakness of extremities Musculoskeletal: no joint pain or swelling Heme: no bleeding, DVT, or easy bruising Endo: no polydipsia or polyuria  BP (!) 88/64   Pulse 65   Ht 5\' 10"  (1.778 m)   Wt 206 lb 3.2 oz (93.5 kg)   SpO2 97%   BMI 29.59 kg/m   PHYSICAL EXAM: GEN:  AO x 3 in no acute distress HEENT: normal Dentition: Normal Neck: JVP normal. +2 carotid upstrokes without bruits. No thyromegaly. Lungs: equal expansion, clear bilaterally CV: Apex is discrete and nondisplaced, regular rate and rhythm with 3 out of 6 holosystolic murmur best heard at the axilla Abd: soft, non-tender, non-distended; no  bruit; positive bowel sounds Ext: no edema, ecchymoses, or cyanosis Vascular: 2+ femoral pulses, 2+ radial pulses       Skin: warm and dry without rash Neuro: CN II-XII grossly intact; motor and sensory grossly intact    DATA AND STUDIES:  EKG: EKG performed October 2024 that I personally reviewed demonstrates atrial fibrillation with rapid ventricular rate and incomplete right bundle branch block  EKG Interpretation Date/Time:    Ventricular Rate:    PR Interval:    QRS Duration:    QT Interval:    QTC Calculation:   R Axis:      Text Interpretation:          Cardiac Studies & Procedures       ECHOCARDIOGRAM  ECHOCARDIOGRAM LIMITED 02/14/2023  Narrative ECHOCARDIOGRAM LIMITED REPORT    Patient Name:   [name] [name] Date of Exam: 02/14/2023 Medical Rec #:  [id number]      Height:       70.0 in Accession #:    [id number]     Weight:       209.0 lb Date of Birth:  1945/12/14       BSA:          2.127 m Patient Age:    77 years       BP:           95/74 mmHg Patient Gender: M              HR:           82 bpm. Exam Location:  [address]  Procedure: Limited Echo, Limited Color Doppler, Cardiac Doppler and 3D Echo  Indications:    Atrial Fibrillation I48.91  History:        Patient has prior history of Echocardiogram examinations, most recent 02/07/2023.  Sonographer:    Thurman Coyer RDCS Referring Phys: [id number] Charlies Con[facility]  IMPRESSIONS   1. Limited f/u echo for MR and EF no subcostal images. 2. Left ventricular ejection fraction, by estimation, is 55%. Left ventricular diastolic parameters are indeterminate. 3. Left atrial size was severely dilated. 4. Right atrial size was severely dilated. 5. Eccentric anteriorly directed severe MR likely atrial FMR and some posterior leaflet prolapse .  The mitral valve is abnormal. Severe mitral valve regurgitation. 6. Tricuspid valve regurgitation is moderate. 7. The aortic valve is tricuspid. There is mild  calcification of the aortic valve. Aortic valve sclerosis is present, with no evidence of aortic valve stenosis. 8. Aortic dilatation noted. There is mild dilatation of the aortic root, measuring 39 mm.  FINDINGS Left Ventricle: Left ventricular ejection fraction, by estimation, is 55%. The left ventricular internal cavity size was normal in size. There is no left ventricular hypertrophy. Left ventricular diastolic parameters are indeterminate.  Left Atrium: Left atrial size was severely dilated.  Right Atrium: Right atrial size was severely dilated.  Mitral Valve: Eccentric anteriorly directed severe MR likely atrial FMR and some posterior leaflet prolapse. The mitral valve is abnormal. There is mild thickening of the mitral valve leaflet(s). There is mild calcification of the mitral valve leaflet(s). Severe mitral valve regurgitation.  Tricuspid Valve: The tricuspid valve is normal in structure. Tricuspid valve regurgitation is moderate.  Aortic Valve: The aortic valve is tricuspid. There is mild calcification of the aortic valve. Aortic valve sclerosis is present, with no evidence of aortic valve stenosis.  Aorta: Aortic dilatation noted. There is mild dilatation of the aortic root, measuring 39 mm.  Additional Comments: Limited f/u echo for MR and EF no subcostal images.  LEFT VENTRICLE PLAX 2D LVIDd:         5.30 cm LVIDs:         3.80 cm LV PW:         1.10 cm LV IVS:        1.10 cm LVOT diam:     2.30 cm   3D Volume EF: LVOT Area:     4.15 cm  3D EF:        55 % LV EDV:       181 ml LV ESV:       82 ml LV SV:        99 ml  LEFT ATRIUM         Index LA diam:    6.00 cm 2.82 cm/m  AORTA Ao Root diam: 3.90 cm  MR Peak grad:    86.1 mmHg MR Mean grad:    62.0 mmHg    SHUNTS MR Vmax:         464.00 cm/s  Systemic Diam: 2.30 cm MR Vmean:        376.0 cm/s MR PISA:         7.60 cm MR PISA Eff ROA: 52 mm MR PISA Radius:  1.10 cm  Charlton Haws MD Electronically  signed by Charlton Haws MD Signature Date/Time: 02/14/2023/3:03:00 PM    Final   TEE  ECHO TEE 02/07/2023  Narrative TRANSESOPHOGEAL ECHO REPORT    Patient Name:   [name] [name] Date of Exam: 02/07/2023 Medical Rec #:  [id number]      Height:       70.0 in Accession #:    [id number]     Weight:       209.0 lb Date of Birth:  March 19, 1946       BSA:          2.127 m Patient Age:    77 years       BP:           121/83 mmHg Patient Gender: M              HR:           152 bpm.  Exam Location:  Inpatient  Procedure: Transesophageal Echo, Color Doppler and Cardiac Doppler  Indications:     Mitral valve insufficiency, unspecified etiology [I34.0] - Primary  History:         Patient has prior history of Echocardiogram examinations, most recent 12/31/2022. Stroke, Arrythmias:Atrial Fibrillation; Risk Factors:Hypertension and Dyslipidemia.  Sonographer:     Lucendia Herrlich RCS Sonographer#2:   Delcie Roch RDCS Referring Phys:  [phone] MIHAI CROITORU Diagnosing Phys: Thurmon Fair MD  PROCEDURE: After discussion of the risks and benefits of a TEE, an informed consent was obtained from the patient. The transesophogeal probe was passed without difficulty through the esophogus of the patient. Imaged were obtained with the patient in a left lateral decubitus position. Sedation performed by different physician. The patient was monitored while under deep sedation. Anesthestetic sedation was provided intravenously by Anesthesiology: 230mg  of Propofol, 50mg  of Lidocaine. Image quality was technically difficult. The patient developed no complications during the procedure. During the study the rhythm was atrail fibrillation with ventricular rate of 150-160 bpm, which affects image quality.  IMPRESSIONS   1. Left ventricular ejection fraction, by estimation, is 45 to 50%. The left ventricle has mildly decreased function. The left ventricle demonstrates global hypokinesis. Left ventricular  diastolic function could not be evaluated. 2. Right ventricular systolic function is low normal. The right ventricular size is mildly enlarged. There is mildly elevated pulmonary artery systolic pressure. The estimated right ventricular systolic pressure is 32.9 mmHg. 3. Left atrial size was severely dilated. No left atrial/left atrial appendage thrombus was detected. 4. Right atrial size was moderately dilated. 5. Mitral valve area by planimetry is >8 cm sq. Gradients could not be accurately obtained due to extreme tachycardia. There are 2 distinct jets of mitral insufficiency: the larger is due to P2 prolapse, with an eccentric anteriorly directed wall-hugging jet. The smaller is at the P2-P3 junction and has a central jet. The vena contracta of the major mitral insufficiency jet was 4 mm and the PISA radius was 1.0 cm (Nyquist limit 39 cm/s). At the area of maximum prolapse the length of the posterior leaflet is 16 mm. The leaflets are only mildly thickened. The mitral valve is myxomatous. Severe mitral valve regurgitation. There is severe holosystolic prolapse of the middle scallop of the posterior leaflet of the mitral valve. 6. The tricuspid valve is myxomatous. Tricuspid valve regurgitation is moderate. 7. The aortic valve is tricuspid. There is mild thickening of the aortic valve. Aortic valve regurgitation is not visualized. Aortic valve sclerosis is present, with no evidence of aortic valve stenosis.  FINDINGS Left Ventricle: Left ventricular ejection fraction, by estimation, is 45 to 50%. The left ventricle has mildly decreased function. The left ventricle demonstrates global hypokinesis. The left ventricular internal cavity size was normal in size. Left ventricular diastolic function could not be evaluated due to atrial fibrillation. Left ventricular diastolic function could not be evaluated.  Right Ventricle: The right ventricular size is mildly enlarged. No increase in right ventricular  wall thickness. Right ventricular systolic function is low normal. There is mildly elevated pulmonary artery systolic pressure. The tricuspid regurgitant velocity is 2.64 m/s, and with an assumed right atrial pressure of 5 mmHg, the estimated right ventricular systolic pressure is 32.9 mmHg.  Left Atrium: Left atrial size was severely dilated. No left atrial/left atrial appendage thrombus was detected.  Right Atrium: Right atrial size was moderately dilated.  Pericardium: Trivial pericardial effusion is present.  Mitral Valve: Mitral valve area by planimetry is >8 cm  sq. Gradients could not be accurately obtained due to extreme tachycardia. There are 2 distinct jets of mitral insufficiency: the larger is due to P2 prolapse, with an eccentric anteriorly directed wall-hugging jet. The smaller is at the P2-P3 junction and has a central jet. The vena contracta of the major mitral insufficiency jet was 4 mm and the PISA radius was 1.0 cm (Nyquist limit 39 cm/s). At the area of maximum prolapse the length of the posterior leaflet is 16 mm. The leaflets are only mildly thickened. The mitral valve is myxomatous. There is severe holosystolic prolapse of the middle scallop of the posterior leaflet of the mitral valve. Severe mitral valve regurgitation, with eccentric anteriorly directed jet.  Tricuspid Valve: The tricuspid valve is myxomatous. Tricuspid valve regurgitation is moderate. There is mild prolapse of the tricuspid.  Aortic Valve: The aortic valve is tricuspid. There is mild thickening of the aortic valve. Aortic valve regurgitation is not visualized. Aortic valve sclerosis is present, with no evidence of aortic valve stenosis.  Pulmonic Valve: The pulmonic valve was normal in structure. Pulmonic valve regurgitation is mild. No evidence of pulmonic stenosis.  Aorta: The aortic root, ascending aorta, aortic arch and descending aorta are all structurally normal, with no evidence of dilitation or  obstruction. There is minimal (Grade I) plaque.  Venous: A systolic blunting flow pattern is recorded from the left upper pulmonary vein and the right upper pulmonary vein.  IAS/Shunts: No atrial level shunt detected by color flow Doppler.   MR Vena Contracta: 0.40 cm TRICUSPID VALVE TR Peak grad:   27.9 mmHg TR Vmax:        264.00 cm/s  Rachelle Hora Croitoru MD Electronically signed by Thurmon Fair MD Signature Date/Time: 02/07/2023/1:16:08 PM    Final   MONITORS  LONG TERM MONITOR (3-14 DAYS) 09/03/2022  Narrative Atrial fib with a slow VR (down to 45/min), controlled VR (ave HR 98/min) and RVR with max HR of 184/min. Rare PVC's with couplets No VT No prolonged pauses  Gregg Taylor,MD  Patch Wear Time:  8 days and 8 hours (2024-05-10T22:29:43-0400 to 2024-05-19T06:38:08-398)  Atrial Fibrillation occurred continuously (100% burden), ranging from 45-184 bpm (avg of 98 bpm). Isolated VEs were rare (<1.0%), VE Couplets were rare (<1.0%), and no VE Triplets were present. Ventricular Trigeminy was present.           05/20/2022: TSH 1.89 12/06/2022: ALT 19 02/06/2023: BUN 18; Creatinine, Ser 1.00; Hemoglobin 14.6; Platelets 228; Potassium 4.0; Sodium 140   STS RISK CALCULATOR: Pending  NHYA CLASS: 2     ASSESSMENT AND PLAN:   Mitral valve insufficiency, unspecified etiology - Plan: EKG 12-Lead, Ambulatory referral to Cardiothoracic Surgery  Permanent atrial fibrillation (HCC) - Plan: EKG 12-Lead  Sinus node dysfunction (HCC) - Plan: EKG 12-Lead  Essential hypertension - Plan: EKG 12-Lead  Dyslipidemia - Plan: EKG 12-Lead  The patient remains asymptomatic.  However he has severe degenerative mitral regurgitation and moderate tricuspid regurgitation in addition to longstanding atrial fibrillation.  At this time we will reduce the patient's Cozaar to 25 mg and stop his diltiazem.  Will start Toprol-XL 50 mg at bedtime due to mild LV dysfunction.  I will refer the patient  for right heart catheterization and coronary angiography study.  I will refer the patient to Dr. Leafy Ro for recommendations regarding his mitral and tricuspid regurgitation.  If a decision not to pursue a surgical intervention (with or without MAZE)then I think we can pursue a mitral transcatheter edge-to-edge repair.  Of note  the patient is in the process of getting dental work done and is scheduled for a couple of root canals in early January.  We will have to time any surgical or transcatheter intervention with this in mind.  I have personally reviewed the patients imaging data as summarized above.  I have reviewed the natural history of mitral regurgitation with the patient and family members who are present today. We have discussed the limitations of medical therapy and the poor prognosis associated with symptomatic mitral regurgitation. We have also reviewed potential treatment options, including palliative medical therapy, conventional mitral surgery, and transcatheter mitral edge-to-edge repair. We discussed treatment options in the context of this patient's specific comorbid medical conditions.   All of the patient's questions were answered today. Will make further recommendations based on the results of studies outlined above.   I spent 35 minutes reviewing all clinical data during and prior to this visit including all relevant imaging studies, laboratories, clinical information from other health systems, and prior notes from both Cardiology and other specialties, interviewing the patient, and conducting a complete physical examination in order to formulate a comprehensive and personalized evaluation and treatment plan.   [name], MD  02/14/2023 3:49 PM    [facility] [address] [place], [place], [place]  [address] Phone: [phone]; Fax: [phone]

## 2023-02-10 NOTE — Progress Notes (Signed)
Patient ID: [name] [name] MRN: [id number] DOB/AGE: 79-09-1945 79 y.o.  Primary Care Physician:[name], [name], MD Primary Cardiologist: None [name]) Electrophysiologist: Lewayne Bunting, MD  FOCUSED CARDIOVASCULAR PROBLEM LIST:   Permanent atrial fibrillation; on Xarelto Atrial flutter ablation 2012 Hyperlipidemia Hypertension Sinus node dysfunction BMI 30 CKD stage II TIA 2011; thought related to atrial flutter Mitral regurgitation Moderate to severe TTE 2024 Tricuspid regurgitation Moderate TTE 2024  HISTORY OF PRESENT ILLNESS:  October 2024: The patient is a 78 y.o. male with the indicated medical history here for recommendations regarding mitral regurgitation.  The patient is usually followed by the EP service.  He was seen recently and was doing well aside from fatigue that he has noticed.  He had an echocardiogram done which demonstrated significant mitral regurgitation thought to be moderate to severe with a posterior wall hugging jet.  Patient also has at least moderate tricuspid regurgitation.  He has biatrial enlargement.    The patient is here to today by himself.  He is taking care of his wife who is recuperating from some health issues.  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  He has noticed increasing fatigue with activities that he could do last year without any issues.  For example he can no longer take a ditch like he could last year without having to stop and rest.  He has noticed no significant shortness of breath per se.  He does not really feel palpitations.  He has had no bleeding issues while on Eliquis.  He really has not noticed any severe peripheral edema and denies any paroxysmal nocturnal dyspnea.  He has not required any emergency room visits or hospitalizations.  He is somewhat limited from Achilles tendon issue in terms of ambulating at a rapid rate.  He does see a dentist on a regular basis.  He has a few root canals and fillings that need to be  performed in the near future.  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  Plan: Refer for TEE.  November 2024: In the interim the patient had a TEE which demonstrated severe mitral regurgitation due to P2 prolapse.Marland Kitchen  He also has moderate tricuspid regurgitation.  His ejection fraction was mildly reduced.  He had a limited echocardiogram today which demonstrated continued severe degenerative mitral regurgitation at least moderate tricuspid regurgitation.  He continues to do well.  He has noted some lightheadedness when he goes from sitting to standing quickly.  He is not feeling shortness of breath or palpitations.  He denies any heart failure symptoms.  He has had no severe bleeding or bruising.  He has not required any emergency room visits or hospitalizations.  He is still caring for his wife who is getting over orthopedic issues and may require another procedure.  Past Medical History:  Diagnosis Date   CVA (cerebral infarction) 07-14-09   Dysrhythmia    History of kidney stones    Hyperlipidemia    Hypertension    Nephrolithiasis    Paroxysmal atrial fibrillation (HCC)    Paroxysmal atrial flutter (HCC)    a. s/p ablation 2012.   Prostate cancer [facility])    Stroke [facility])     Past Surgical History:  Procedure Laterality Date   APPENDECTOMY     BACK SURGERY     CYSTOSCOPY/URETEROSCOPY/HOLMIUM LASER/STENT PLACEMENT Left 09/19/2020   Procedure: CYSTOSCOPY RETROGRADE PYELOGRAM URETEROSCOPY/HOLMIUM LASER/STENT PLACEMENT;  Surgeon: Bjorn Pippin, MD;  Location: [facility] ORS;  Service: Urology;  Laterality: Left;   EYE SURGERY     PROSTATECTOMY  2005   TEE WITHOUT CARDIOVERSION N/A 02/07/2023   Procedure: TRANSESOPHAGEAL ECHOCARDIOGRAM;  Surgeon: Thurmon Fair, MD;  Location: [facility] INVASIVE CV LAB;  Service: Cardiovascular;  Laterality: N/A;    Family History  Problem Relation Age of Onset   Heart disease Mother 57       died   Prostate cancer Father        alive   Testicular  cancer Brother        died    Social History   Socioeconomic History   Marital status: Married    Spouse name: Not on file   Number of children: 2   Years of education: Not on file   Highest education level: Not on file  Occupational History   Occupation: Personnel officer: [employer]  Tobacco Use   Smoking status: Never   Smokeless tobacco: Never  Vaping Use   Vaping status: Never Used  Substance and Sexual Activity   Alcohol use: No   Drug use: No   Sexual activity: Not on file  Other Topics Concern   Not on file  Social History Narrative   Not on file   Social Determinants of Health   Financial Resource Strain: Low Risk  (10/31/2022)   Overall Financial Resource Strain (CARDIA)    Difficulty of Paying Living Expenses: Not hard at all  Food Insecurity: No Food Insecurity (10/31/2022)   Hunger Vital Sign    Worried About Running Out of Food in the Last Year: Never true    Ran Out of Food in the Last Year: Never true  Transportation Needs: No Transportation Needs (10/31/2022)   PRAPARE - Administrator, Civil Service (Medical): No    Lack of Transportation (Non-Medical): No  Physical Activity: Insufficiently Active (10/31/2022)   Exercise Vital Sign    Days of Exercise per Week: 3 days    Minutes of Exercise per Session: 20 min  Stress: No Stress Concern Present (10/31/2022)   [employer] of Occupational Health - Occupational Stress Questionnaire    Feeling of Stress : Not at all  Social Connections: Socially Integrated (10/31/2022)   Social Connection and Isolation Panel [NHANES]    Frequency of Communication with Friends and Family: More than three times a week    Frequency of Social Gatherings with Friends and Family: Three times a week    Attends Religious Services: More than 4 times per year    Active Member of Clubs or Organizations: Yes    Attends Banker Meetings: 1 to 4 times per year    Marital Status: Married   Catering manager Violence: Not At Risk (10/31/2022)   Humiliation, Afraid, Rape, and Kick questionnaire    Fear of Current or Ex-Partner: No    Emotionally Abused: No    Physically Abused: No    Sexually Abused: No     Prior to Admission medications   Medication Sig Start Date End Date Taking? Authorizing Provider  atorvastatin (LIPITOR) 20 MG tablet TAKE 1 TABLET(20 MG) BY MOUTH DAILY 05/20/22   [name], MD  diltiazem (CARDIZEM CD) 180 MG 24 hr capsule Take 1 capsule (180 mg total) by mouth daily. 01/09/23   Graciella Freer, PA-C  losartan (COZAAR) 50 MG tablet TAKE 1 TABLET(50 MG) BY MOUTH DAILY 07/24/22   [name], MD  rivaroxaban (XARELTO) 20 MG TABS tablet Take 1 tablet (  20 mg total) by mouth daily with supper. 07/29/22   Marinus Maw, MD  traMADol (ULTRAM) 50 MG tablet TAKE 1 TABLET BY MOUTH EVERY 6 HOURS AS NEEDED. 09/04/22   [name], MD    No Known Allergies  REVIEW OF SYSTEMS:  General: no fevers/chills/night sweats Eyes: no blurry vision, diplopia, or amaurosis ENT: no sore throat or hearing loss Resp: no cough, wheezing, or hemoptysis CV: no edema or palpitations GI: no abdominal pain, nausea, vomiting, diarrhea, or constipation GU: no dysuria, frequency, or hematuria Skin: no rash Neuro: no headache, numbness, tingling, or weakness of extremities Musculoskeletal: no joint pain or swelling Heme: no bleeding, DVT, or easy bruising Endo: no polydipsia or polyuria  BP (!) 88/64   Pulse 65   Ht 5\' 10"  (1.778 m)   Wt 206 lb 3.2 oz (93.5 kg)   SpO2 97%   BMI 29.59 kg/m   PHYSICAL EXAM: GEN:  AO x 3 in no acute distress HEENT: normal Dentition: Normal Neck: JVP normal. +2 carotid upstrokes without bruits. No thyromegaly. Lungs: equal expansion, clear bilaterally CV: Apex is discrete and nondisplaced, regular rate and rhythm with 3 out of 6 holosystolic murmur best heard at the axilla Abd: soft, non-tender, non-distended; no  bruit; positive bowel sounds Ext: no edema, ecchymoses, or cyanosis Vascular: 2+ femoral pulses, 2+ radial pulses       Skin: warm and dry without rash Neuro: CN II-XII grossly intact; motor and sensory grossly intact    DATA AND STUDIES:  EKG: EKG performed October 2024 that I personally reviewed demonstrates atrial fibrillation with rapid ventricular rate and incomplete right bundle branch block  EKG Interpretation Date/Time:    Ventricular Rate:    PR Interval:    QRS Duration:    QT Interval:    QTC Calculation:   R Axis:      Text Interpretation:          Cardiac Studies & Procedures       ECHOCARDIOGRAM  ECHOCARDIOGRAM LIMITED 02/14/2023  Narrative ECHOCARDIOGRAM LIMITED REPORT    Patient Name:   [name] [name] Date of Exam: 02/14/2023 Medical Rec #:  [id number]      Height:       70.0 in Accession #:    [id number]     Weight:       209.0 lb Date of Birth:  1945/12/14       BSA:          2.127 m Patient Age:    77 years       BP:           95/74 mmHg Patient Gender: M              HR:           82 bpm. Exam Location:  [address]  Procedure: Limited Echo, Limited Color Doppler, Cardiac Doppler and 3D Echo  Indications:    Atrial Fibrillation I48.91  History:        Patient has prior history of Echocardiogram examinations, most recent 02/07/2023.  Sonographer:    Thurman Coyer RDCS Referring Phys: [id number] Charlies Con[facility]  IMPRESSIONS   1. Limited f/u echo for MR and EF no subcostal images. 2. Left ventricular ejection fraction, by estimation, is 55%. Left ventricular diastolic parameters are indeterminate. 3. Left atrial size was severely dilated. 4. Right atrial size was severely dilated. 5. Eccentric anteriorly directed severe MR likely atrial FMR and some posterior leaflet prolapse .  The mitral valve is abnormal. Severe mitral valve regurgitation. 6. Tricuspid valve regurgitation is moderate. 7. The aortic valve is tricuspid. There is mild  calcification of the aortic valve. Aortic valve sclerosis is present, with no evidence of aortic valve stenosis. 8. Aortic dilatation noted. There is mild dilatation of the aortic root, measuring 39 mm.  FINDINGS Left Ventricle: Left ventricular ejection fraction, by estimation, is 55%. The left ventricular internal cavity size was normal in size. There is no left ventricular hypertrophy. Left ventricular diastolic parameters are indeterminate.  Left Atrium: Left atrial size was severely dilated.  Right Atrium: Right atrial size was severely dilated.  Mitral Valve: Eccentric anteriorly directed severe MR likely atrial FMR and some posterior leaflet prolapse. The mitral valve is abnormal. There is mild thickening of the mitral valve leaflet(s). There is mild calcification of the mitral valve leaflet(s). Severe mitral valve regurgitation.  Tricuspid Valve: The tricuspid valve is normal in structure. Tricuspid valve regurgitation is moderate.  Aortic Valve: The aortic valve is tricuspid. There is mild calcification of the aortic valve. Aortic valve sclerosis is present, with no evidence of aortic valve stenosis.  Aorta: Aortic dilatation noted. There is mild dilatation of the aortic root, measuring 39 mm.  Additional Comments: Limited f/u echo for MR and EF no subcostal images.  LEFT VENTRICLE PLAX 2D LVIDd:         5.30 cm LVIDs:         3.80 cm LV PW:         1.10 cm LV IVS:        1.10 cm LVOT diam:     2.30 cm   3D Volume EF: LVOT Area:     4.15 cm  3D EF:        55 % LV EDV:       181 ml LV ESV:       82 ml LV SV:        99 ml  LEFT ATRIUM         Index LA diam:    6.00 cm 2.82 cm/m  AORTA Ao Root diam: 3.90 cm  MR Peak grad:    86.1 mmHg MR Mean grad:    62.0 mmHg    SHUNTS MR Vmax:         464.00 cm/s  Systemic Diam: 2.30 cm MR Vmean:        376.0 cm/s MR PISA:         7.60 cm MR PISA Eff ROA: 52 mm MR PISA Radius:  1.10 cm  Charlton Haws MD Electronically  signed by Charlton Haws MD Signature Date/Time: 02/14/2023/3:03:00 PM    Final   TEE  ECHO TEE 02/07/2023  Narrative TRANSESOPHOGEAL ECHO REPORT    Patient Name:   [name] [name] Date of Exam: 02/07/2023 Medical Rec #:  [id number]      Height:       70.0 in Accession #:    [id number]     Weight:       209.0 lb Date of Birth:  March 19, 1946       BSA:          2.127 m Patient Age:    77 years       BP:           121/83 mmHg Patient Gender: M              HR:           152 bpm.  Exam Location:  Inpatient  Procedure: Transesophageal Echo, Color Doppler and Cardiac Doppler  Indications:     Mitral valve insufficiency, unspecified etiology [I34.0] - Primary  History:         Patient has prior history of Echocardiogram examinations, most recent 12/31/2022. Stroke, Arrythmias:Atrial Fibrillation; Risk Factors:Hypertension and Dyslipidemia.  Sonographer:     Lucendia Herrlich RCS Sonographer#2:   Delcie Roch RDCS Referring Phys:  [phone] MIHAI CROITORU Diagnosing Phys: Thurmon Fair MD  PROCEDURE: After discussion of the risks and benefits of a TEE, an informed consent was obtained from the patient. The transesophogeal probe was passed without difficulty through the esophogus of the patient. Imaged were obtained with the patient in a left lateral decubitus position. Sedation performed by different physician. The patient was monitored while under deep sedation. Anesthestetic sedation was provided intravenously by Anesthesiology: 230mg  of Propofol, 50mg  of Lidocaine. Image quality was technically difficult. The patient developed no complications during the procedure. During the study the rhythm was atrail fibrillation with ventricular rate of 150-160 bpm, which affects image quality.  IMPRESSIONS   1. Left ventricular ejection fraction, by estimation, is 45 to 50%. The left ventricle has mildly decreased function. The left ventricle demonstrates global hypokinesis. Left ventricular  diastolic function could not be evaluated. 2. Right ventricular systolic function is low normal. The right ventricular size is mildly enlarged. There is mildly elevated pulmonary artery systolic pressure. The estimated right ventricular systolic pressure is 32.9 mmHg. 3. Left atrial size was severely dilated. No left atrial/left atrial appendage thrombus was detected. 4. Right atrial size was moderately dilated. 5. Mitral valve area by planimetry is >8 cm sq. Gradients could not be accurately obtained due to extreme tachycardia. There are 2 distinct jets of mitral insufficiency: the larger is due to P2 prolapse, with an eccentric anteriorly directed wall-hugging jet. The smaller is at the P2-P3 junction and has a central jet. The vena contracta of the major mitral insufficiency jet was 4 mm and the PISA radius was 1.0 cm (Nyquist limit 39 cm/s). At the area of maximum prolapse the length of the posterior leaflet is 16 mm. The leaflets are only mildly thickened. The mitral valve is myxomatous. Severe mitral valve regurgitation. There is severe holosystolic prolapse of the middle scallop of the posterior leaflet of the mitral valve. 6. The tricuspid valve is myxomatous. Tricuspid valve regurgitation is moderate. 7. The aortic valve is tricuspid. There is mild thickening of the aortic valve. Aortic valve regurgitation is not visualized. Aortic valve sclerosis is present, with no evidence of aortic valve stenosis.  FINDINGS Left Ventricle: Left ventricular ejection fraction, by estimation, is 45 to 50%. The left ventricle has mildly decreased function. The left ventricle demonstrates global hypokinesis. The left ventricular internal cavity size was normal in size. Left ventricular diastolic function could not be evaluated due to atrial fibrillation. Left ventricular diastolic function could not be evaluated.  Right Ventricle: The right ventricular size is mildly enlarged. No increase in right ventricular  wall thickness. Right ventricular systolic function is low normal. There is mildly elevated pulmonary artery systolic pressure. The tricuspid regurgitant velocity is 2.64 m/s, and with an assumed right atrial pressure of 5 mmHg, the estimated right ventricular systolic pressure is 32.9 mmHg.  Left Atrium: Left atrial size was severely dilated. No left atrial/left atrial appendage thrombus was detected.  Right Atrium: Right atrial size was moderately dilated.  Pericardium: Trivial pericardial effusion is present.  Mitral Valve: Mitral valve area by planimetry is >8 cm  sq. Gradients could not be accurately obtained due to extreme tachycardia. There are 2 distinct jets of mitral insufficiency: the larger is due to P2 prolapse, with an eccentric anteriorly directed wall-hugging jet. The smaller is at the P2-P3 junction and has a central jet. The vena contracta of the major mitral insufficiency jet was 4 mm and the PISA radius was 1.0 cm (Nyquist limit 39 cm/s). At the area of maximum prolapse the length of the posterior leaflet is 16 mm. The leaflets are only mildly thickened. The mitral valve is myxomatous. There is severe holosystolic prolapse of the middle scallop of the posterior leaflet of the mitral valve. Severe mitral valve regurgitation, with eccentric anteriorly directed jet.  Tricuspid Valve: The tricuspid valve is myxomatous. Tricuspid valve regurgitation is moderate. There is mild prolapse of the tricuspid.  Aortic Valve: The aortic valve is tricuspid. There is mild thickening of the aortic valve. Aortic valve regurgitation is not visualized. Aortic valve sclerosis is present, with no evidence of aortic valve stenosis.  Pulmonic Valve: The pulmonic valve was normal in structure. Pulmonic valve regurgitation is mild. No evidence of pulmonic stenosis.  Aorta: The aortic root, ascending aorta, aortic arch and descending aorta are all structurally normal, with no evidence of dilitation or  obstruction. There is minimal (Grade I) plaque.  Venous: A systolic blunting flow pattern is recorded from the left upper pulmonary vein and the right upper pulmonary vein.  IAS/Shunts: No atrial level shunt detected by color flow Doppler.   MR Vena Contracta: 0.40 cm TRICUSPID VALVE TR Peak grad:   27.9 mmHg TR Vmax:        264.00 cm/s  Rachelle Hora Croitoru MD Electronically signed by Thurmon Fair MD Signature Date/Time: 02/07/2023/1:16:08 PM    Final   MONITORS  LONG TERM MONITOR (3-14 DAYS) 09/03/2022  Narrative Atrial fib with a slow VR (down to 45/min), controlled VR (ave HR 98/min) and RVR with max HR of 184/min. Rare PVC's with couplets No VT No prolonged pauses  Gregg Taylor,MD  Patch Wear Time:  8 days and 8 hours (2024-05-10T22:29:43-0400 to 2024-05-19T06:38:08-398)  Atrial Fibrillation occurred continuously (100% burden), ranging from 45-184 bpm (avg of 98 bpm). Isolated VEs were rare (<1.0%), VE Couplets were rare (<1.0%), and no VE Triplets were present. Ventricular Trigeminy was present.           05/20/2022: TSH 1.89 12/06/2022: ALT 19 02/06/2023: BUN 18; Creatinine, Ser 1.00; Hemoglobin 14.6; Platelets 228; Potassium 4.0; Sodium 140   STS RISK CALCULATOR: Pending  NHYA CLASS: 2     ASSESSMENT AND PLAN:   Mitral valve insufficiency, unspecified etiology - Plan: EKG 12-Lead, Ambulatory referral to Cardiothoracic Surgery  Permanent atrial fibrillation (HCC) - Plan: EKG 12-Lead  Sinus node dysfunction (HCC) - Plan: EKG 12-Lead  Essential hypertension - Plan: EKG 12-Lead  Dyslipidemia - Plan: EKG 12-Lead  The patient remains asymptomatic.  However he has severe degenerative mitral regurgitation and moderate tricuspid regurgitation in addition to longstanding atrial fibrillation.  At this time we will reduce the patient's Cozaar to 25 mg and stop his diltiazem.  Will start Toprol-XL 50 mg at bedtime due to mild LV dysfunction.  I will refer the patient  for right heart catheterization and coronary angiography study.  I will refer the patient to Dr. Leafy Ro for recommendations regarding his mitral and tricuspid regurgitation.  If a decision not to pursue a surgical intervention (with or without MAZE)then I think we can pursue a mitral transcatheter edge-to-edge repair.  Of note  the patient is in the process of getting dental work done and is scheduled for a couple of root canals in early January.  We will have to time any surgical or transcatheter intervention with this in mind.  I have personally reviewed the patients imaging data as summarized above.  I have reviewed the natural history of mitral regurgitation with the patient and family members who are present today. We have discussed the limitations of medical therapy and the poor prognosis associated with symptomatic mitral regurgitation. We have also reviewed potential treatment options, including palliative medical therapy, conventional mitral surgery, and transcatheter mitral edge-to-edge repair. We discussed treatment options in the context of this patient's specific comorbid medical conditions.   All of the patient's questions were answered today. Will make further recommendations based on the results of studies outlined above.   I spent 35 minutes reviewing all clinical data during and prior to this visit including all relevant imaging studies, laboratories, clinical information from other health systems, and prior notes from both Cardiology and other specialties, interviewing the patient, and conducting a complete physical examination in order to formulate a comprehensive and personalized evaluation and treatment plan.   [name], MD  02/14/2023 3:49 PM    [facility] [address] [place], [place], [place]  [address] Phone: [phone]; Fax: [phone]

## 2023-02-11 ENCOUNTER — Telehealth: Payer: Self-pay

## 2023-02-11 DIAGNOSIS — I4821 Permanent atrial fibrillation: Secondary | ICD-10-CM

## 2023-02-11 DIAGNOSIS — I340 Nonrheumatic mitral (valve) insufficiency: Secondary | ICD-10-CM

## 2023-02-11 NOTE — Telephone Encounter (Signed)
-----   Message from [name] sent at 02/08/2023  7:38 AM EDT ----- Thanks!   I suppose with jet being so eccentric and the LA so large, that is why we are not seeing pulm vein flow abnormalities.     Michalene, please set him up for a limited TTE for EF in 1 week and will determine CCB vs BB.  Thanks. ----- Message ----- From: Thurmon Fair, MD Sent: 02/07/2023  11:11 AM EDT To: Marinus Maw, MD; [name], MD  He came in with RVR around 130 bpm, despite the increased dose of diltiazem after his office visit a month aog. Esophageal intubation was very challenging despite good sedation. Heart rate was 150 during TEE, so not the best study for quantification. MR is at least 3+ and is possibly severe, but there was no pulmonary vein flow reversal and the PAP is normal. Looks like good anatomy for Mitraclip. I will increase his diltiazem to 240 mg daily for now. May need to switch to beta blockers with lower LVEF. Mihai

## 2023-02-11 NOTE — Telephone Encounter (Signed)
Per Dr. [name], scheduled the patient for echo with his office visit 02/14/2023.  Attempted to call the patient to confirm plan.  Left message to call back.

## 2023-02-12 NOTE — Telephone Encounter (Signed)
Confirmed echo and office visit changes with [name]. He understands he will now arrive 04/15/2022 at 1335 for limited echo then visit with Dr. [name]. He was grateful for call and agreed with plan.

## 2023-02-13 DIAGNOSIS — K080 Exfoliation of teeth due to systemic causes: Secondary | ICD-10-CM | POA: Diagnosis not present

## 2023-02-14 ENCOUNTER — Ambulatory Visit (HOSPITAL_BASED_OUTPATIENT_CLINIC_OR_DEPARTMENT_OTHER): Payer: Medicare Other

## 2023-02-14 ENCOUNTER — Encounter: Payer: Self-pay | Admitting: Internal Medicine

## 2023-02-14 ENCOUNTER — Ambulatory Visit: Payer: Medicare Other | Attending: Internal Medicine | Admitting: Internal Medicine

## 2023-02-14 VITALS — BP 88/64 | HR 65 | Ht 70.0 in | Wt 206.2 lb

## 2023-02-14 DIAGNOSIS — I4821 Permanent atrial fibrillation: Secondary | ICD-10-CM

## 2023-02-14 DIAGNOSIS — I340 Nonrheumatic mitral (valve) insufficiency: Secondary | ICD-10-CM | POA: Insufficient documentation

## 2023-02-14 DIAGNOSIS — E785 Hyperlipidemia, unspecified: Secondary | ICD-10-CM | POA: Insufficient documentation

## 2023-02-14 DIAGNOSIS — I495 Sick sinus syndrome: Secondary | ICD-10-CM | POA: Diagnosis not present

## 2023-02-14 DIAGNOSIS — I10 Essential (primary) hypertension: Secondary | ICD-10-CM | POA: Insufficient documentation

## 2023-02-14 LAB — ECHOCARDIOGRAM LIMITED
Est EF: 55
MV M vel: 4.64 m/s
MV Peak grad: 86.1 mm[Hg]
Radius: 1.1 cm
S' Lateral: 3.8 cm

## 2023-02-14 MED ORDER — LOSARTAN POTASSIUM 25 MG PO TABS: 25.0000 mg | ORAL_TABLET | Freq: Every day | ORAL | 3 refills | Status: DC

## 2023-02-14 MED ORDER — METOPROLOL SUCCINATE ER 50 MG PO TB24: 50.0000 mg | ORAL_TABLET | Freq: Every day | ORAL | 3 refills | Status: DC

## 2023-02-14 NOTE — Patient Instructions (Addendum)
Medication Instructions:  Your physician has recommended you make the following change in your medication:  1.) stop diltiazem 2.) decrease losartan to 25 mg daily 3.) start metoprolol succinate (Toprol XL) 50 mg - one tablet daily at bedtime  *If you need a refill on your cardiac medications before your next appointment, please call your pharmacy*   Lab Work: none   Testing/Procedures: Your physician has requested that you have a cardiac catheterization. Cardiac catheterization is used to diagnose and/or treat various heart conditions. Doctors may recommend this procedure for a number of different reasons. The most common reason is to evaluate chest pain. Chest pain can be a symptom of coronary artery disease (CAD), and cardiac catheterization can show whether plaque is narrowing or blocking your heart's arteries. This procedure is also used to evaluate the valves, as well as measure the blood flow and oxygen levels in different parts of your heart. For further information please visit [email or web address] Please follow instruction sheet, as given.   Follow-Up: Per Structural Heart Team         Cardiac/Peripheral Catheterization   You are scheduled for a Cardiac Catheterization on Thursday, November 21 with Dr. Alverda Skeans.  1. Please arrive at the [facility] (Main Entrance A) at [facility]: [address] [place], [place] [address] at 7:00 AM (This time is TWO hour(s) before your procedure to ensure your preparation). Free valet parking service is available. You will check in at ADMITTING. The support person will be asked to wait in the waiting room.  It is OK to have someone drop you off and come back when you are ready to be discharged.        Special note: Every effort is made to have your procedure done on time. Please understand that emergencies sometimes delay scheduled procedures.  2. Diet: Do not eat solid foods after midnight.  You may have clear liquids until  5 AM the day of the procedure.  3. Labs: drawn 02/06/23  4. Medication instructions in preparation for your procedure:   Contrast Allergy: No  NO XARELTO AFTER MONDAY Feb 24, 2023 - RESTART WHEN INSTRUCTED AFTER CATH  On the morning of your procedure, take Aspirin 81 mg and any morning medicines NOT listed above.  You may use sips of water.  5. Plan to go home the same day, you will only stay overnight if medically necessary. 6. You MUST have a responsible adult to drive you home. 7. An adult MUST be with you the first 24 hours after you arrive home. 8. Bring a current list of your medications, and the last time and date medication taken. 9. Bring ID and current insurance cards. 10.Please wear clothes that are easy to get on and off and wear slip-on shoes.  Thank you for allowing [place] to care for you!   -- [facility]

## 2023-02-20 DIAGNOSIS — K080 Exfoliation of teeth due to systemic causes: Secondary | ICD-10-CM | POA: Diagnosis not present

## 2023-02-25 ENCOUNTER — Telehealth: Payer: Self-pay | Admitting: *Deleted

## 2023-02-25 NOTE — Telephone Encounter (Addendum)
Cardiac Catheterization scheduled at [facility] for: Thursday February 27, 2023 9 AM Arrival time [facility] Main Entrance A at: 7 AM  Nothing to eat after midnight prior to procedure, clear liquids until 5 AM day of procedure.  Medication instructions: -Hold:  Xarelto-none 02/25/23 until post procedure -Other usual morning medications can be taken with sips of water including aspirin 81 mg.  Plan to go home the same day, you will only stay overnight if medically necessary.  You must have responsible adult to drive you home.  Someone must be with you the first 24 hours after you arrive home.  Reviewed procedure instructions with patient.

## 2023-02-27 ENCOUNTER — Other Ambulatory Visit: Payer: Self-pay

## 2023-02-27 ENCOUNTER — Other Ambulatory Visit: Payer: Self-pay | Admitting: Physician Assistant

## 2023-02-27 ENCOUNTER — Ambulatory Visit (HOSPITAL_COMMUNITY)
Admission: RE | Admit: 2023-02-27 | Discharge: 2023-02-27 | Disposition: A | Discharge status: Home / Self Care | Payer: Medicare Other | Attending: Internal Medicine | Admitting: Internal Medicine

## 2023-02-27 ENCOUNTER — Encounter (HOSPITAL_COMMUNITY)
Admission: RE | Disposition: A | Discharge status: Home / Self Care | Payer: Self-pay | Source: Home / Self Care | Attending: Internal Medicine

## 2023-02-27 DIAGNOSIS — I4892 Unspecified atrial flutter: Secondary | ICD-10-CM | POA: Diagnosis not present

## 2023-02-27 DIAGNOSIS — I495 Sick sinus syndrome: Secondary | ICD-10-CM | POA: Insufficient documentation

## 2023-02-27 DIAGNOSIS — Z8673 Personal history of transient ischemic attack (TIA), and cerebral infarction without residual deficits: Secondary | ICD-10-CM | POA: Insufficient documentation

## 2023-02-27 DIAGNOSIS — I4821 Permanent atrial fibrillation: Secondary | ICD-10-CM | POA: Diagnosis not present

## 2023-02-27 DIAGNOSIS — E785 Hyperlipidemia, unspecified: Secondary | ICD-10-CM | POA: Insufficient documentation

## 2023-02-27 DIAGNOSIS — Z79899 Other long term (current) drug therapy: Secondary | ICD-10-CM | POA: Diagnosis not present

## 2023-02-27 DIAGNOSIS — I2510 Atherosclerotic heart disease of native coronary artery without angina pectoris: Secondary | ICD-10-CM | POA: Diagnosis not present

## 2023-02-27 DIAGNOSIS — Z7901 Long term (current) use of anticoagulants: Secondary | ICD-10-CM | POA: Diagnosis not present

## 2023-02-27 DIAGNOSIS — I340 Nonrheumatic mitral (valve) insufficiency: Secondary | ICD-10-CM

## 2023-02-27 DIAGNOSIS — I083 Combined rheumatic disorders of mitral, aortic and tricuspid valves: Secondary | ICD-10-CM | POA: Insufficient documentation

## 2023-02-27 DIAGNOSIS — I129 Hypertensive chronic kidney disease with stage 1 through stage 4 chronic kidney disease, or unspecified chronic kidney disease: Secondary | ICD-10-CM | POA: Insufficient documentation

## 2023-02-27 DIAGNOSIS — N182 Chronic kidney disease, stage 2 (mild): Secondary | ICD-10-CM | POA: Insufficient documentation

## 2023-02-27 HISTORY — PX: RIGHT/LEFT HEART CATH AND CORONARY ANGIOGRAPHY: CATH118266

## 2023-02-27 LAB — POCT I-STAT EG7
Acid-base deficit: 3 mmol/L — ABNORMAL HIGH (ref 0.0–2.0)
Acid-base deficit: 1 mmol/L (ref 0.0–2.0)
Bicarbonate: 22.8 mmol/L (ref 20.0–28.0)
Bicarbonate: 24.3 mmol/L (ref 20.0–28.0)
Calcium, Ion: 1.2 mmol/L (ref 1.15–1.40)
Calcium, Ion: 1.13 mmol/L — ABNORMAL LOW (ref 1.15–1.40)
HCT: 39 % (ref 39.0–52.0)
HCT: 39 % (ref 39.0–52.0)
Hemoglobin: 13.3 g/dL (ref 13.0–17.0)
Hemoglobin: 13.3 g/dL (ref 13.0–17.0)
O2 Saturation: 74 %
O2 Saturation: 69 %
Potassium: 3.8 mmol/L (ref 3.5–5.1)
Potassium: 3.7 mmol/L (ref 3.5–5.1)
Sodium: 145 mmol/L (ref 135–145)
Sodium: 145 mmol/L (ref 135–145)
TCO2: 24 mmol/L (ref 22–32)
TCO2: 26 mmol/L (ref 22–32)
pCO2, Ven: 41.3 mm[Hg] — ABNORMAL LOW (ref 44–60)
pCO2, Ven: 41.7 mm[Hg] — ABNORMAL LOW (ref 44–60)
pH, Ven: 7.35 (ref 7.25–7.43)
pH, Ven: 7.374 (ref 7.25–7.43)
pO2, Ven: 41 mm[Hg] (ref 32–45)
pO2, Ven: 37 mm[Hg] (ref 32–45)

## 2023-02-27 LAB — POCT I-STAT 7, (LYTES, BLD GAS, ICA,H+H)
Acid-base deficit: 4 mmol/L — ABNORMAL HIGH (ref 0.0–2.0)
Bicarbonate: 20.9 mmol/L (ref 20.0–28.0)
Calcium, Ion: 1.2 mmol/L (ref 1.15–1.40)
HCT: 41 % (ref 39.0–52.0)
Hemoglobin: 13.9 g/dL (ref 13.0–17.0)
O2 Saturation: 97 %
Potassium: 3.8 mmol/L (ref 3.5–5.1)
Sodium: 143 mmol/L (ref 135–145)
TCO2: 22 mmol/L (ref 22–32)
pCO2 arterial: 36.5 mm[Hg] (ref 32–48)
pH, Arterial: 7.365 (ref 7.35–7.45)
pO2, Arterial: 92 mm[Hg] (ref 83–108)

## 2023-02-27 LAB — POCT ACTIVATED CLOTTING TIME: Activated Clotting Time: 187 s

## 2023-02-27 SURGERY — RIGHT/LEFT HEART CATH AND CORONARY ANGIOGRAPHY
Anesthesia: LOCAL

## 2023-02-27 MED ORDER — LIDOCAINE HCL (PF) 1 % IJ SOLN
INTRAMUSCULAR | Status: DC | PRN
  Administered 2023-02-27 (×2): 2 mL

## 2023-02-27 MED ORDER — SODIUM CHLORIDE 0.9% FLUSH: 3.0000 mL | INTRAVENOUS | Status: DC | PRN

## 2023-02-27 MED ORDER — HEPARIN (PORCINE) IN NACL 1000-0.9 UT/500ML-% IV SOLN
INTRAVENOUS | Status: DC | PRN
  Administered 2023-02-27 (×2): 500 mL

## 2023-02-27 MED ORDER — VERAPAMIL HCL 2.5 MG/ML IV SOLN
INTRAVENOUS | Status: AC
  Filled 2023-02-27: qty 2

## 2023-02-27 MED ORDER — FENTANYL CITRATE (PF) 100 MCG/2ML IJ SOLN
INTRAMUSCULAR | Status: DC | PRN
  Administered 2023-02-27: 25 ug via INTRAVENOUS

## 2023-02-27 MED ORDER — FUROSEMIDE 20 MG PO TABS: 20.0000 mg | ORAL_TABLET | Freq: Every day | ORAL | 11 refills | Status: DC

## 2023-02-27 MED ORDER — ACETAMINOPHEN 325 MG PO TABS
650.0000 mg | ORAL_TABLET | ORAL | Status: DC | PRN
Start: 2023-02-27 — End: 2023-02-27

## 2023-02-27 MED ORDER — LABETALOL HCL 5 MG/ML IV SOLN: 10.0000 mg | INTRAVENOUS | Status: DC | PRN

## 2023-02-27 MED ORDER — LIDOCAINE HCL (PF) 1 % IJ SOLN
INTRAMUSCULAR | Status: AC
  Filled 2023-02-27: qty 30

## 2023-02-27 MED ORDER — IOHEXOL 350 MG/ML SOLN
INTRAVENOUS | Status: DC | PRN
  Administered 2023-02-27: 50 mL

## 2023-02-27 MED ORDER — SODIUM CHLORIDE 0.9 % WEIGHT BASED INFUSION: 1.0000 mL/kg/h | INTRAVENOUS | Status: DC

## 2023-02-27 MED ORDER — VERAPAMIL HCL 2.5 MG/ML IV SOLN
INTRAVENOUS | Status: DC | PRN
  Administered 2023-02-27: 10 mL via INTRA_ARTERIAL

## 2023-02-27 MED ORDER — MIDAZOLAM HCL 2 MG/2ML IJ SOLN
INTRAMUSCULAR | Status: DC | PRN
  Administered 2023-02-27: 1 mg via INTRAVENOUS

## 2023-02-27 MED ORDER — HEPARIN SODIUM (PORCINE) 1000 UNIT/ML IJ SOLN
INTRAMUSCULAR | Status: AC
  Filled 2023-02-27: qty 10

## 2023-02-27 MED ORDER — FENTANYL CITRATE (PF) 100 MCG/2ML IJ SOLN
INTRAMUSCULAR | Status: AC
  Filled 2023-02-27: qty 2

## 2023-02-27 MED ORDER — HEPARIN SODIUM (PORCINE) 1000 UNIT/ML IJ SOLN
INTRAMUSCULAR | Status: DC | PRN
  Administered 2023-02-27: 5000 [IU] via INTRA_ARTERIAL

## 2023-02-27 MED ORDER — HYDRALAZINE HCL 20 MG/ML IJ SOLN: 10.0000 mg | INTRAMUSCULAR | Status: DC | PRN

## 2023-02-27 MED ORDER — ASPIRIN 81 MG PO CHEW: 81.0000 mg | CHEWABLE_TABLET | ORAL | Status: DC

## 2023-02-27 MED ORDER — MIDAZOLAM HCL 2 MG/2ML IJ SOLN
INTRAMUSCULAR | Status: AC
  Filled 2023-02-27: qty 2

## 2023-02-27 MED ORDER — SODIUM CHLORIDE 0.9 % IV SOLN: 250.0000 mL | INTRAVENOUS | Status: DC | PRN

## 2023-02-27 MED ORDER — SODIUM CHLORIDE 0.9% FLUSH: 3.0000 mL | Freq: Two times a day (BID) | INTRAVENOUS | Status: DC

## 2023-02-27 MED ORDER — ONDANSETRON HCL 4 MG/2ML IJ SOLN: 4.0000 mg | Freq: Four times a day (QID) | INTRAMUSCULAR | Status: DC | PRN

## 2023-02-27 MED ORDER — SODIUM CHLORIDE 0.9 % WEIGHT BASED INFUSION
3.0000 mL/kg/h | INTRAVENOUS | Status: DC
  Administered 2023-02-27: 3 mL/kg/h via INTRAVENOUS

## 2023-02-27 SURGICAL SUPPLY — 11 items
CATH BALLN WEDGE 5F 110CM (CATHETERS) IMPLANT
CATH DIAG 6FR PIGTAIL ANGLED (CATHETERS) IMPLANT
CATH INFINITI AMBI 6FR TG (CATHETERS) IMPLANT
CATH INFINITI JR4 5F (CATHETERS) IMPLANT
DEVICE RAD COMP TR BAND LRG (VASCULAR PRODUCTS) IMPLANT
GLIDESHEATH SLEND SS 6F .021 (SHEATH) IMPLANT
GUIDEWIRE .025 260CM (WIRE) IMPLANT
PACK CARDIAC CATHETERIZATION (CUSTOM PROCEDURE TRAY) ×2 IMPLANT
SET ATX-X65L (MISCELLANEOUS) IMPLANT
SHEATH GLIDE SLENDER 4/5FR (SHEATH) IMPLANT
WIRE EMERALD 3MM-J .035X260CM (WIRE) IMPLANT

## 2023-02-27 NOTE — Interval H&P Note (Signed)
History and Physical Interval Note:  02/27/2023 7:32 AM  [name] [name]  has presented today for surgery, with the diagnosis of mr.  The various methods of treatment have been discussed with the patient and family. After consideration of risks, benefits and other options for treatment, the patient has consented to  Procedure(s): RIGHT/LEFT HEART CATH AND CORONARY ANGIOGRAPHY (N/A) as a surgical intervention.  The patient's history has been reviewed, patient examined, no change in status, stable for surgery.  I have reviewed the patient's chart and labs.  Questions were answered to the patient's satisfaction.     [name]

## 2023-02-27 NOTE — Progress Notes (Signed)
BMP 1 week after cath

## 2023-02-27 NOTE — Progress Notes (Signed)
Called and left message to return call. 

## 2023-02-27 NOTE — Discharge Instructions (Addendum)
Start lasix 20mg  daily and blood work next week.  Restart Xarelto tomorrow.

## 2023-02-28 ENCOUNTER — Encounter (HOSPITAL_COMMUNITY): Payer: Self-pay | Admitting: Internal Medicine

## 2023-02-28 NOTE — Progress Notes (Signed)
Patient identification verified by 2 forms. Marilynn Rail, RN    Called and spoke to patient  Relayed provider message  Patient aware of 12/2 follow up appointment  Patient verbalized understanding, no questions at this time

## 2023-03-04 ENCOUNTER — Other Ambulatory Visit: Payer: Medicare Other

## 2023-03-10 ENCOUNTER — Encounter: Payer: Self-pay | Admitting: Nurse Practitioner

## 2023-03-10 ENCOUNTER — Other Ambulatory Visit: Payer: Self-pay

## 2023-03-10 ENCOUNTER — Ambulatory Visit: Payer: Medicare Other | Attending: Nurse Practitioner | Admitting: Nurse Practitioner

## 2023-03-10 VITALS — BP 114/86 | HR 115 | Ht 70.0 in | Wt 195.0 lb

## 2023-03-10 DIAGNOSIS — I340 Nonrheumatic mitral (valve) insufficiency: Secondary | ICD-10-CM | POA: Diagnosis not present

## 2023-03-10 DIAGNOSIS — I4821 Permanent atrial fibrillation: Secondary | ICD-10-CM | POA: Diagnosis not present

## 2023-03-10 DIAGNOSIS — E785 Hyperlipidemia, unspecified: Secondary | ICD-10-CM

## 2023-03-10 DIAGNOSIS — I4892 Unspecified atrial flutter: Secondary | ICD-10-CM

## 2023-03-10 DIAGNOSIS — I495 Sick sinus syndrome: Secondary | ICD-10-CM

## 2023-03-10 DIAGNOSIS — I2510 Atherosclerotic heart disease of native coronary artery without angina pectoris: Secondary | ICD-10-CM

## 2023-03-10 DIAGNOSIS — I10 Essential (primary) hypertension: Secondary | ICD-10-CM

## 2023-03-10 NOTE — Patient Instructions (Addendum)
Medication Instructions:  Your physician recommends that you continue on your current medications as directed. Please refer to the Current Medication list given to you today.  *If you need a refill on your cardiac medications before your next appointment, please call your pharmacy*   Lab Work: BMET today   Testing/Procedures: NONE ordered at this time of appointment     Follow-Up: At [facility], you and your health needs are our priority.  As part of our continuing mission to provide you with exceptional heart care, we have created designated Provider Care Teams.  These Care Teams include your primary Cardiologist (physician) and Advanced Practice Providers (APPs -  Physician Assistants and Nurse Practitioners) who all work together to provide you with the care you need, when you need it.  We recommend signing up for the patient portal called "MyChart".  Sign up information is provided on this After Visit Summary.  MyChart is used to connect with patients for Virtual Visits (Telemedicine).  Patients are able to view lab/test results, encounter notes, upcoming appointments, etc.  Non-urgent messages can be sent to your provider as well.   To learn more about what you can do with MyChart, go to [email or web address].    Your next appointment:    Keep follow up appointments with CT surgery & EP Doctor   Provider:   Casimiro Needle "Mardelle Matte" Lanna Poche, PA-C    Other Instructions Monitor Heart Rate. Report heart rate consistently less than 60 or constsitently greater than 110. Report Systolic Blood pressure (top number) consistently less than 110. Discussed [place]ardi[place] in office.

## 2023-03-10 NOTE — Progress Notes (Signed)
Office Visit    Patient Name: [name] [name] Date of Encounter: 03/10/2023  Primary Care Provider:  [name], MD Primary Cardiologist:  [name], MD  Chief Complaint    77 year old male with a history of mild nonobstructive CAD, severe mitral valve regurgitation/mitral valve prolapse, moderate tricuspid valve regurgitation, permanent atrial fibrillation, atrial flutter s/p ablation in 2012, sinus node dysfunction, hypertension, hyperlipidemia, TIA, prostate cancer, and CKD stage II who presents for follow-up related to CAD, atrial fibrillation and valvular heart disease.  Past Medical History    Past Medical History:  Diagnosis Date   CVA (cerebral infarction) 07-14-09   Dysrhythmia    History of kidney stones    Hyperlipidemia    Hypertension    Nephrolithiasis    Paroxysmal atrial fibrillation (HCC)    Paroxysmal atrial flutter (HCC)    a. s/p ablation 2012.   Prostate cancer [facility])    Stroke [facility])    Past Surgical History:  Procedure Laterality Date   APPENDECTOMY     BACK SURGERY     CYSTOSCOPY/URETEROSCOPY/HOLMIUM LASER/STENT PLACEMENT Left 09/19/2020   Procedure: CYSTOSCOPY RETROGRADE PYELOGRAM URETEROSCOPY/HOLMIUM LASER/STENT PLACEMENT;  Surgeon: Bjorn Pippin, MD;  Location: [facility];  Service: Urology;  Laterality: Left;   EYE SURGERY     PROSTATECTOMY  2005   RIGHT/LEFT HEART CATH AND CORONARY ANGIOGRAPHY N/A 02/27/2023   Procedure: RIGHT/LEFT HEART CATH AND CORONARY ANGIOGRAPHY;  Surgeon: [name], MD;  Location: [facility] INVASIVE CV LAB;  Service: Cardiovascular;  Laterality: N/A;   TEE WITHOUT CARDIOVERSION N/A 02/07/2023   Procedure: TRANSESOPHAGEAL ECHOCARDIOGRAM;  Surgeon: Thurmon Fair, MD;  Location: [facility] INVASIVE CV LAB;  Service: Cardiovascular;  Laterality: N/A;    Allergies  No Known Allergies   Labs/Other Studies Reviewed    The following studies were reviewed today:  Cardiac Studies & Procedures   CARDIAC  CATHETERIZATION  CARDIAC CATHETERIZATION 02/27/2023  Narrative   Prox LAD to Mid LAD lesion is 30% stenosed.  1.  Mild nonobstructive coronary artery disease. 2.  Fick cardiac output of 6.1 L/min and Fick cardiac index of 2.9 L/min/m with the following hemodynamics: Right atrial pressure mean of 13 mmHg Right ventricular pressure 35/3 with end-diastolic pressure of 13 mmHg Wedge pressure mean of 20 mmHg with V waves to 25 mmHg Pulmonary artery pressure 38/21 with a mean of 29 mmHg PVR of 0.66 Woods units PA pulsatility index of 1.3 3.  LVEDP of 23 mmHg  Recommendation: Cardiothoracic surgical opinion regarding mitral and tricuspid valvular disease.  Start lasix 20mg  daily with BMP in one week.  Findings Coronary Findings Diagnostic  Dominance: Right  Left Anterior Descending There is mild diffuse disease throughout the vessel. Prox LAD to Mid LAD lesion is 30% stenosed.  Left Circumflex The vessel exhibits minimal luminal irregularities.  Right Coronary Artery The vessel exhibits minimal luminal irregularities.  Intervention  No interventions have been documented.     ECHOCARDIOGRAM  ECHOCARDIOGRAM LIMITED 02/14/2023  Narrative ECHOCARDIOGRAM LIMITED REPORT    Patient Name:   [name] [name] Date of Exam: 02/14/2023 Medical Rec #:  [id number]      Height:       70.0 in Accession #:    [id number]     Weight:       209.0 lb Date of Birth:  Jan 28, 1946       BSA:          2.127 m Patient Age:    62 years  BP:           95/74 mmHg Patient Gender: M              HR:           82 bpm. Exam Location:  [facility]  Procedure: Limited Echo, Limited Color Doppler, Cardiac Doppler and 3D Echo  Indications:    Atrial Fibrillation I48.91  History:        Patient has prior history of Echocardiogram examinations, most recent 02/07/2023.  Sonographer:    Thurman Coyer RDCS Referring Phys: [id number] Charlies Constable [facility]  IMPRESSIONS   1. Limited f/u echo for MR and EF  no subcostal images. 2. Left ventricular ejection fraction, by estimation, is 55%. Left ventricular diastolic parameters are indeterminate. 3. Left atrial size was severely dilated. 4. Right atrial size was severely dilated. 5. Eccentric anteriorly directed severe MR likely atrial FMR and some posterior leaflet prolapse . The mitral valve is abnormal. Severe mitral valve regurgitation. 6. Tricuspid valve regurgitation is moderate. 7. The aortic valve is tricuspid. There is mild calcification of the aortic valve. Aortic valve sclerosis is present, with no evidence of aortic valve stenosis. 8. Aortic dilatation noted. There is mild dilatation of the aortic root, measuring 39 mm.  FINDINGS Left Ventricle: Left ventricular ejection fraction, by estimation, is 55%. The left ventricular internal cavity size was normal in size. There is no left ventricular hypertrophy. Left ventricular diastolic parameters are indeterminate.  Left Atrium: Left atrial size was severely dilated.  Right Atrium: Right atrial size was severely dilated.  Mitral Valve: Eccentric anteriorly directed severe MR likely atrial FMR and some posterior leaflet prolapse. The mitral valve is abnormal. There is mild thickening of the mitral valve leaflet(s). There is mild calcification of the mitral valve leaflet(s). Severe mitral valve regurgitation.  Tricuspid Valve: The tricuspid valve is normal in structure. Tricuspid valve regurgitation is moderate.  Aortic Valve: The aortic valve is tricuspid. There is mild calcification of the aortic valve. Aortic valve sclerosis is present, with no evidence of aortic valve stenosis.  Aorta: Aortic dilatation noted. There is mild dilatation of the aortic root, measuring 39 mm.  Additional Comments: Limited f/u echo for MR and EF no subcostal images.  LEFT VENTRICLE PLAX 2D LVIDd:         5.30 cm LVIDs:         3.80 cm LV PW:         1.10 cm LV IVS:        1.10 cm LVOT diam:     2.30  cm   3D Volume EF: LVOT Area:     4.15 cm  3D EF:        55 % LV EDV:       181 ml LV ESV:       82 ml LV SV:        99 ml  LEFT ATRIUM         Index LA diam:    6.00 cm 2.82 cm/m  AORTA Ao Root diam: 3.90 cm  MR Peak grad:    86.1 mmHg MR Mean grad:    62.0 mmHg    SHUNTS MR Vmax:         464.00 cm/s  Systemic Diam: 2.30 cm MR Vmean:        376.0 cm/s MR PISA:         7.60 cm MR PISA Eff ROA: 52 mm MR PISA Radius:  1.10 cm  Charlton Haws MD Electronically signed by Charlton Haws MD Signature Date/Time: 02/14/2023/3:03:00 PM    Final   TEE  ECHO TEE 02/07/2023  Narrative TRANSESOPHOGEAL ECHO REPORT    Patient Name:   RASHUN MCKEOUGH Date of Exam: 02/07/2023 Medical Rec #:  [id number]      Height:       70.0 in Accession #:    [id number]     Weight:       209.0 lb Date of Birth:  [birth year]       BSA:          2.127 m Patient Age:    77 years       BP:           121/83 mmHg Patient Gender: M              HR:           152 bpm. Exam Location:  Inpatient  Procedure: Transesophageal Echo, Color Doppler and Cardiac Doppler  Indications:     Mitral valve insufficiency, unspecified etiology [I34.0] - Primary  History:         Patient has prior history of Echocardiogram examinations, most recent 12/31/2022. Stroke, Arrythmias:Atrial Fibrillation; Risk Factors:Hypertension and Dyslipidemia.  Sonographer:     Lucendia Herrlich RCS Sonographer#2:   Delcie Roch RDCS Referring Phys:  [id number] MIHAI CROITORU Diagnosing Phys: Thurmon Fair MD  PROCEDURE: After discussion of the risks and benefits of a TEE, an informed consent was obtained from the patient. The transesophogeal probe was passed without difficulty through the esophogus of the patient. Imaged were obtained with the patient in a left lateral decubitus position. Sedation performed by different physician. The patient was monitored while under deep sedation. Anesthestetic sedation was provided intravenously by  Anesthesiology: 230mg  of Propofol, 50mg  of Lidocaine. Image quality was technically difficult. The patient developed no complications during the procedure. During the study the rhythm was atrail fibrillation with ventricular rate of 150-160 bpm, which affects image quality.  IMPRESSIONS   1. Left ventricular ejection fraction, by estimation, is 45 to 50%. The left ventricle has mildly decreased function. The left ventricle demonstrates global hypokinesis. Left ventricular diastolic function could not be evaluated. 2. Right ventricular systolic function is low normal. The right ventricular size is mildly enlarged. There is mildly elevated pulmonary artery systolic pressure. The estimated right ventricular systolic pressure is 32.9 mmHg. 3. Left atrial size was severely dilated. No left atrial/left atrial appendage thrombus was detected. 4. Right atrial size was moderately dilated. 5. Mitral valve area by planimetry is >8 cm sq. Gradients could not be accurately obtained due to extreme tachycardia. There are 2 distinct jets of mitral insufficiency: the larger is due to P2 prolapse, with an eccentric anteriorly directed wall-hugging jet. The smaller is at the P2-P3 junction and has a central jet. The vena contracta of the major mitral insufficiency jet was 4 mm and the PISA radius was 1.0 cm (Nyquist limit 39 cm/s). At the area of maximum prolapse the length of the posterior leaflet is 16 mm. The leaflets are only mildly thickened. The mitral valve is myxomatous. Severe mitral valve regurgitation. There is severe holosystolic prolapse of the middle scallop of the posterior leaflet of the mitral valve. 6. The tricuspid valve is myxomatous. Tricuspid valve regurgitation is moderate. 7. The aortic valve is tricuspid. There is mild thickening of the aortic valve. Aortic valve regurgitation is not visualized. Aortic valve sclerosis is present, with no evidence of aortic valve stenosis.  FINDINGS Left  Ventricle: Left ventricular ejection fraction, by estimation, is 45 to 50%. The left ventricle has mildly decreased function. The left ventricle demonstrates global hypokinesis. The left ventricular internal cavity size was normal in size. Left ventricular diastolic function could not be evaluated due to atrial fibrillation. Left ventricular diastolic function could not be evaluated.  Right Ventricle: The right ventricular size is mildly enlarged. No increase in right ventricular wall thickness. Right ventricular systolic function is low normal. There is mildly elevated pulmonary artery systolic pressure. The tricuspid regurgitant velocity is 2.64 m/s, and with an assumed right atrial pressure of 5 mmHg, the estimated right ventricular systolic pressure is 32.9 mmHg.  Left Atrium: Left atrial size was severely dilated. No left atrial/left atrial appendage thrombus was detected.  Right Atrium: Right atrial size was moderately dilated.  Pericardium: Trivial pericardial effusion is present.  Mitral Valve: Mitral valve area by planimetry is >8 cm sq. Gradients could not be accurately obtained due to extreme tachycardia. There are 2 distinct jets of mitral insufficiency: the larger is due to P2 prolapse, with an eccentric anteriorly directed wall-hugging jet. The smaller is at the P2-P3 junction and has a central jet. The vena contracta of the major mitral insufficiency jet was 4 mm and the PISA radius was 1.0 cm (Nyquist limit 39 cm/s). At the area of maximum prolapse the length of the posterior leaflet is 16 mm. The leaflets are only mildly thickened. The mitral valve is myxomatous. There is severe holosystolic prolapse of the middle scallop of the posterior leaflet of the mitral valve. Severe mitral valve regurgitation, with eccentric anteriorly directed jet.  Tricuspid Valve: The tricuspid valve is myxomatous. Tricuspid valve regurgitation is moderate. There is mild prolapse of the  tricuspid.  Aortic Valve: The aortic valve is tricuspid. There is mild thickening of the aortic valve. Aortic valve regurgitation is not visualized. Aortic valve sclerosis is present, with no evidence of aortic valve stenosis.  Pulmonic Valve: The pulmonic valve was normal in structure. Pulmonic valve regurgitation is mild. No evidence of pulmonic stenosis.  Aorta: The aortic root, ascending aorta, aortic arch and descending aorta are all structurally normal, with no evidence of dilitation or obstruction. There is minimal (Grade I) plaque.  Venous: A systolic blunting flow pattern is recorded from the left upper pulmonary vein and the right upper pulmonary vein.  IAS/Shunts: No atrial level shunt detected by color flow Doppler.   MR Vena Contracta: 0.40 cm TRICUSPID VALVE TR Peak grad:   27.9 mmHg TR Vmax:        264.00 cm/s  Rachelle Hora Croitoru MD Electronically signed by Thurmon Fair MD Signature Date/Time: 02/07/2023/1:16:08 PM    Final   MONITORS  LONG TERM MONITOR (3-14 DAYS) 09/03/2022  Narrative Atrial fib with a slow VR (down to 45/min), controlled VR (ave HR 98/min) and RVR with max HR of 184/min. Rare PVC's with couplets No VT No prolonged pauses  Gregg Taylor,MD  Patch Wear Time:  8 days and 8 hours (2024-05-10T22:29:43-0400 to 2024-05-19T06:38:08-398)  Atrial Fibrillation occurred continuously (100% burden), ranging from 45-184 bpm (avg of 98 bpm). Isolated VEs were rare (<1.0%), VE Couplets were rare (<1.0%), and no VE Triplets were present. Ventricular Trigeminy was present.          Recent Labs: 05/20/2022: TSH 1.89 12/06/2022: ALT 19 02/06/2023: BUN 18; Creatinine, Ser 1.00; Platelets 228 02/27/2023: Hemoglobin 13.3; Potassium 3.7; Sodium 145  Recent Lipid Panel    Component Value Date/Time   CHOL 111 12/06/2022  0919   CHOL 113 03/02/2018 0824   TRIG 98 12/06/2022 0919   HDL 28 (L) 12/06/2022 0919   HDL 28 (L) 03/02/2018 0824   CHOLHDL 4.0 12/06/2022  0919   VLDL 28 07/29/2016 0837   LDLCALC 65 12/06/2022 0919    History of Present Illness    77 year old male with the above past medical history including mild nonobstructive CAD, severe mitral valve regurgitation/mitral valve prolapse, moderate tricuspid valve regurgitation, permanent atrial fibrillation, atrial flutter s/p ablation in 2012, sinus node dysfunction, hypertension, hyperlipidemia, TIA, prostate cancer, and CKD stage II.  He has a history of permanent atrial fibrillation on Xarelto has primarily follow-up with EP.  Echocardiogram per PCP in 12/2022 showed EF 45 to 50%, mild RV dysfunction, severe LAE, moderate RAE, mitral valve with bowing of moderate to severe anterior directed mitral valve regurgitation. TEE on 02/07/2023 showed EF 45 to 50%, mildly decreased LV function, LV global hypokinesis, myxomatous mitral valve with severe mitral valve regurgitation, severe holosystolic prolapse of the middle scallop of the posterior leaflet of the mitral valve, myxomatous tricuspid valve with moderate tricuspid valve regurgitation. He was last seen in the office on 02/14/2023 was stable from a cardiac standpoint.  Limited echo on 02/14/2023 showed EF 55% severe BAE, severe mitral valve regurgitation, moderate TR.   He underwent diagnostic R/LHC on 02/27/2023 which revealed mild nonobstructive CAD (30% proximal to mid LAD), mildly elevated PASP.  He was started on Lasix 20 mg daily.  He was referred to CT surgery for ongoing management of mitral valve/tricuspid valve regurgitation.  He presents today for follow-up.  Since his last visit and since his procedure he has been stable from a cardiac standpoint.  His heart rate has been elevated.  He denies any chest pain, palpitations, dizziness, dyspnea, edema, PND, orthopnea, weight gain.   Home Medications    Current Outpatient Medications  Medication Sig Dispense Refill   atorvastatin (LIPITOR) 20 MG tablet TAKE 1 TABLET(20 MG) BY MOUTH DAILY  (Patient taking differently: Take 20 mg by mouth every evening.) 90 tablet 3   furosemide (LASIX) 20 MG tablet Take 1 tablet (20 mg total) by mouth daily. 30 tablet 11   losartan (COZAAR) 25 MG tablet Take 1 tablet (25 mg total) by mouth daily. (Patient taking differently: Take 25 mg by mouth every evening.) 90 tablet 3   metoprolol succinate (TOPROL-XL) 50 MG 24 hr tablet Take 1 tablet (50 mg total) by mouth at bedtime. Take with or immediately following a meal. 90 tablet 3   rivaroxaban (XARELTO) 20 MG TABS tablet TAKE 1 TABLET BY MOUTH ONCE DAILY WITH SUPPER 90 tablet 1   traMADol (ULTRAM) 50 MG tablet TAKE 1 TABLET BY MOUTH EVERY 6 HOURS AS NEEDED. 120 tablet 0   No current facility-administered medications for this visit.     Review of Systems    He denies chest pain, palpitations, dyspnea, pnd, orthopnea, n, v, dizziness, syncope, edema, weight gain, or early satiety. All other systems reviewed and are otherwise negative except as noted above.   Physical Exam    VS:  BP 114/86 (BP Location: Left Arm, Patient Position: Sitting, Cuff Size: Normal)   Pulse (!) 115   Ht 5\' 10"  (1.778 m)   Wt 195 lb (88.5 kg)   SpO2 95%   BMI 27.98 kg/m   GEN: Well nourished, well developed, in no acute distress. HEENT: normal. Neck: Supple, no JVD, carotid bruits, or masses. Cardiac: RRR, no murmurs, rubs, or gallops. No  clubbing, cyanosis, edema.  Radials/DP/PT 2+ and equal bilaterally.   Right radial cath site without bruising, bleeding, bruit or hematoma.   Respiratory:  Respirations regular and unlabored, clear to auscultation bilaterally. GI: Soft, nontender, nondistended, BS + x 4. MS: no deformity or atrophy. Skin: warm and dry, no rash. Neuro:  Strength and sensation are intact. Psych: Normal affect.  Accessory Clinical Findings    ECG personally reviewed by me today - EKG Interpretation Date/Time:  Monday March 10 2023 14:31:58 EST Ventricular Rate:  115 PR Interval:    QRS  Duration:  102 QT Interval:  302 QTC Calculation: 417 R Axis:   -58  Text Interpretation: Atrial fibrillation with rapid ventricular response Left axis deviation Incomplete right bundle branch block Confirmed by Bernadene Person ([id number]) on 03/10/2023 2:44:08 PM  - no acute changes.   Lab Results  Component Value Date   WBC 5.7 02/06/2023   HGB 13.3 02/27/2023   HCT 39.0 02/27/2023   MCV 94 02/06/2023   PLT 228 02/06/2023   Lab Results  Component Value Date   CREATININE 1.00 02/06/2023   BUN 18 02/06/2023   NA 145 02/27/2023   K 3.7 02/27/2023   CL 106 02/06/2023   CO2 20 02/06/2023   Lab Results  Component Value Date   ALT 19 12/06/2022   AST 17 12/06/2022   ALKPHOS 54 07/29/2016   BILITOT 0.8 12/06/2022   Lab Results  Component Value Date   CHOL 111 12/06/2022   HDL 28 (L) 12/06/2022   LDLCALC 65 12/06/2022   TRIG 98 12/06/2022   CHOLHDL 4.0 12/06/2022    Lab Results  Component Value Date   HGBA1C (H) 07/10/2009    6.2 (NOTE) The ADA recommends the following therapeutic goal for glycemic control related to Hgb A1c measurement: Goal of therapy: <6.5 Hgb A1c  Reference: American Diabetes Association: Clinical Practice Recommendations 2010, Diabetes Care, 2010, 33: (Suppl  1).    Assessment & Plan   1. Nonobstructive CAD: R/LHC on 02/27/2023 which revealed mild nonobstructive CAD (30% proximal to mid LAD), mildly elevated PASP. Stable with no anginal symptoms.   2. Severe mitral valve regurgitation/mitral valve prolapse/tricuspid valve regurgitation: TEE on 02/07/2023 showed EF 45 to 50%, mildly decreased LV function, LV global hypokinesis, myxomatous mitral valve with severe mitral valve regurgitation, severe holosystolic prolapse of the middle scallop of the posterior leaflet of the mitral valve, myxomatous tricuspid valve with moderate tricuspid valve regurgitation.  Follow-up limited echo on 02/14/2023 showed EF 55%, severe BAE, severe mitral valve regurgitation,  moderate TR. Recent cath as above with mildly elevated PASP.  Generally euvolemic and well compensated on exam.  Pending CT surgery consult. Continue Lasix.   2. Permanent atrial fibrillation/atrial flutter/sinus node dysfunction: HR elevated in office today. Per pt, HR has been elevated over the past month.  Will increase metoprolol to 75 mg daily.  Will check BMET today. We discussed home monitoring with Kardia mobile device.  Continue to monitor HR/BP and report heart rate consistently less than 60 bpm, greater than 110 bpm, SBP consistently less than 100. Continue Xarelto.   3. Hypertension: BP well controlled. Continue current antihypertensive regimen.   4. Hyperlipidemia: LDL was 65 in 11/2022. Continue Lipitor.   5. History of TIA: No recurrence. Continue Xarelto.   6. Disposition:   Follow-up with CT surgery as scheduled in 03/2023, follow-up with EP as scheduled in 04/2023.       Joylene Grapes, NP 03/10/2023, 6:42 PM

## 2023-03-11 ENCOUNTER — Telehealth: Payer: Self-pay

## 2023-03-11 LAB — BASIC METABOLIC PANEL
BUN/Creatinine Ratio: 18 (ref 10–24)
BUN: 17 mg/dL (ref 8–27)
CO2: 24 mmol/L (ref 20–29)
Calcium: 9.3 mg/dL (ref 8.6–10.2)
Chloride: 105 mmol/L (ref 96–106)
Creatinine, Ser: 0.97 mg/dL (ref 0.76–1.27)
Glucose: 76 mg/dL (ref 70–99)
Potassium: 4.4 mmol/L (ref 3.5–5.2)
Sodium: 146 mmol/L — ABNORMAL HIGH (ref 134–144)
eGFR: 79 mL/min/{1.73_m2} (ref >59)

## 2023-03-11 NOTE — Telephone Encounter (Signed)
Spoke with pt. Pt was notified of lab results. Pt will continue current medication and f/u as planned.  

## 2023-03-17 DIAGNOSIS — K080 Exfoliation of teeth due to systemic causes: Secondary | ICD-10-CM | POA: Diagnosis not present

## 2023-03-17 NOTE — Progress Notes (Unsigned)
[address]       [place] [address]             [phone]           DEARIS THIERRY [facility] Medical Record #[id number] Date of Birth: 1945/08/03  [name], MD [name], MD  Chief Complaint:  increasing fatigue   History of Present Illness:     Pt is a 77 yo male who has had atrial fibrillation for the past 67yrs or so and has had ablations and has been followed by the EP service. He has recently had an increase in his BB secondary to fast heart rates. He has no DOE or lower ext edema and has no idea when his heart rate is fast but believes he has been in permanent afib for the past year. 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 He has suffered a stroke in the past around the time of an ablation in 2011. He has no residual deficits and has been tolerating NOAC for many years. He recently had a cath without PHTN nor CAD      Past Medical History:  Diagnosis Date   CVA (cerebral infarction) 07-14-09   Dysrhythmia    History of kidney stones    Hyperlipidemia    Hypertension    Nephrolithiasis    Paroxysmal atrial fibrillation (HCC)    Paroxysmal atrial flutter (HCC)    a. s/p ablation 2012.   Prostate cancer [facility])    Stroke [facility])     Past Surgical History:  Procedure Laterality Date   APPENDECTOMY     BACK SURGERY     CYSTOSCOPY/URETEROSCOPY/HOLMIUM LASER/STENT PLACEMENT Left 09/19/2020   Procedure: CYSTOSCOPY RETROGRADE PYELOGRAM URETEROSCOPY/HOLMIUM LASER/STENT PLACEMENT;  Surgeon: Bjorn Pippin, MD;  Location: [facility];  Service: Urology;  Laterality: Left;   EYE SURGERY     PROSTATECTOMY  2005   RIGHT/LEFT HEART CATH AND CORONARY ANGIOGRAPHY N/A 02/27/2023   Procedure: RIGHT/LEFT HEART CATH AND CORONARY ANGIOGRAPHY;  Surgeon: [name], MD;  Location: [facility] INVASIVE CV LAB;  Service: Cardiovascular;  Laterality:  N/A;   TEE WITHOUT CARDIOVERSION N/A 02/07/2023   Procedure: TRANSESOPHAGEAL ECHOCARDIOGRAM;  Surgeon: Thurmon Fair, MD;  Location: [facility] INVASIVE CV LAB;  Service: Cardiovascular;  Laterality: N/A;    Social History   Tobacco Use  Smoking Status Never  Smokeless Tobacco Never    Social History   Substance and Sexual Activity  Alcohol Use No    Social History   Socioeconomic History   Marital status: Married    Spouse name: Not on file   Number of children: 2   Years of education: Not on file   Highest education level: Not on file  Occupational History   Occupation: Personnel officer: [employer]  Tobacco Use   Smoking status: Never   Smokeless tobacco: Never  Vaping Use   Vaping status: Never Used  Substance and Sexual Activity   Alcohol use: No   Drug use: No   Sexual activity: Not on file  Other Topics Concern   Not on file  Social History Narrative   Not on file   Social Determinants of Health   Financial Resource Strain: Low Risk  (10/31/2022)   Overall Financial Resource Strain (CARDIA)    Difficulty  of Paying Living Expenses: Not hard at all  Food Insecurity: No Food Insecurity (10/31/2022)   Hunger Vital Sign    Worried About Running Out of Food in the Last Year: Never true    Ran Out of Food in the Last Year: Never true  Transportation Needs: No Transportation Needs (10/31/2022)   PRAPARE - Administrator, Civil Service (Medical): No    Lack of Transportation (Non-Medical): No  Physical Activity: Insufficiently Active (10/31/2022)   Exercise Vital Sign    Days of Exercise per Week: 3 days    Minutes of Exercise per Session: 20 min  Stress: No Stress Concern Present (10/31/2022)   [employer] of Occupational Health - Occupational Stress Questionnaire    Feeling of Stress : Not at all  Social Connections: Socially Integrated (10/31/2022)   Social Connection and Isolation Panel [NHANES]    Frequency of Communication with  Friends and Family: More than three times a week    Frequency of Social Gatherings with Friends and Family: Three times a week    Attends Religious Services: More than 4 times per year    Active Member of Clubs or Organizations: Yes    Attends Banker Meetings: 1 to 4 times per year    Marital Status: Married  Catering manager Violence: Not At Risk (10/31/2022)   Humiliation, Afraid, Rape, and Kick questionnaire    Fear of Current or Ex-Partner: No    Emotionally Abused: No    Physically Abused: No    Sexually Abused: No    No Known Allergies  Current Outpatient Medications  Medication Sig Dispense Refill   atorvastatin (LIPITOR) 20 MG tablet TAKE 1 TABLET(20 MG) BY MOUTH DAILY (Patient taking differently: Take 20 mg by mouth every evening.) 90 tablet 3   furosemide (LASIX) 20 MG tablet Take 1 tablet (20 mg total) by mouth daily. 30 tablet 11   losartan (COZAAR) 25 MG tablet Take 1 tablet (25 mg total) by mouth daily. (Patient taking differently: Take 25 mg by mouth every evening.) 90 tablet 3   metoprolol succinate (TOPROL-XL) 50 MG 24 hr tablet Take 1 tablet (50 mg total) by mouth at bedtime. Take with or immediately following a meal. 90 tablet 3   rivaroxaban (XARELTO) 20 MG TABS tablet TAKE 1 TABLET BY MOUTH ONCE DAILY WITH SUPPER 90 tablet 1   traMADol (ULTRAM) 50 MG tablet TAKE 1 TABLET BY MOUTH EVERY 6 HOURS AS NEEDED. 120 tablet 0   No current facility-administered medications for this visit.     Family History  Problem Relation Age of Onset   Heart disease Mother 1       died   Prostate cancer Father        alive   Testicular cancer Brother        died       Physical Exam: Lungs: clear Card: IRR tachy 3/6 sem Ext: no edema Neuro Intact     Diagnostic Studies & Laboratory data: I have personally reviewed the following studies and agree with the findings   TEE (02/2023) IMPRESSIONS     1. Left ventricular ejection fraction, by estimation, is  45 to 50%. The  left ventricle has mildly decreased function. The left ventricle  demonstrates global hypokinesis. Left ventricular diastolic function could  not be evaluated.   2. Right ventricular systolic function is low normal. The right  ventricular size is mildly enlarged. There is mildly elevated pulmonary  artery systolic pressure. The  estimated right ventricular systolic  pressure is 32.9 mmHg.   3. Left atrial size was severely dilated. No left atrial/left atrial  appendage thrombus was detected.   4. Right atrial size was moderately dilated.   5. Mitral valve area by planimetry is >8 cm sq. Gradients could not be  accurately obtained due to extreme tachycardia. There are 2 distinct jets  of mitral insufficiency: the larger is due to P2 prolapse, with an  eccentric anteriorly directed  wall-hugging jet. The smaller is at the P2-P3 junction and has a central  jet. The vena contracta of the major mitral insufficiency jet was 4 mm and  the PISA radius was 1.0 cm (Nyquist limit 39 cm/s). At the area of maximum  prolapse the length of the  posterior leaflet is 16 mm. The leaflets are only mildly thickened. The  mitral valve is myxomatous. Severe mitral valve regurgitation. There is  severe holosystolic prolapse of the middle scallop of the posterior  leaflet of the mitral valve.   6. The tricuspid valve is myxomatous. Tricuspid valve regurgitation is  moderate.   7. The aortic valve is tricuspid. There is mild thickening of the aortic  valve. Aortic valve regurgitation is not visualized. Aortic valve  sclerosis is present, with no evidence of aortic valve stenosis.   FINDINGS   Left Ventricle: Left ventricular ejection fraction, by estimation, is 45  to 50%. The left ventricle has mildly decreased function. The left  ventricle demonstrates global hypokinesis. The left ventricular internal  cavity size was normal in size. Left  ventricular diastolic function could not be  evaluated due to atrial  fibrillation. Left ventricular diastolic function could not be evaluated.   Right Ventricle: The right ventricular size is mildly enlarged. No  increase in right ventricular wall thickness. Right ventricular systolic  function is low normal. There is mildly elevated pulmonary artery systolic  pressure. The tricuspid regurgitant  velocity is 2.64 m/s, and with an assumed right atrial pressure of 5 mmHg,  the estimated right ventricular systolic pressure is 32.9 mmHg.   Left Atrium: Left atrial size was severely dilated. No left atrial/left  atrial appendage thrombus was detected.   Right Atrium: Right atrial size was moderately dilated.   Pericardium: Trivial pericardial effusion is present.   Mitral Valve: Mitral valve area by planimetry is >8 cm sq. Gradients could  not be accurately obtained due to extreme tachycardia. There are 2  distinct jets of mitral insufficiency: the larger is due to P2 prolapse,  with an eccentric anteriorly directed  wall-hugging jet. The smaller is at the P2-P3 junction and has a central  jet. The vena contracta of the major mitral insufficiency jet was 4 mm and  the PISA radius was 1.0 cm (Nyquist limit 39 cm/s). At the area of maximum  prolapse the length of the  posterior leaflet is 16 mm. The leaflets are only mildly thickened. The  mitral valve is myxomatous. There is severe holosystolic prolapse of the  middle scallop of the posterior leaflet of the mitral valve. Severe mitral  valve regurgitation, with  eccentric anteriorly directed jet.   Tricuspid Valve: The tricuspid valve is myxomatous. Tricuspid valve  regurgitation is moderate. There is mild prolapse of the tricuspid.   Aortic Valve: The aortic valve is tricuspid. There is mild thickening of  the aortic valve. Aortic valve regurgitation is not visualized. Aortic  valve sclerosis is present, with no evidence of aortic valve stenosis.   Pulmonic Valve: The pulmonic  valve was normal in structure. Pulmonic valve  regurgitation is mild. No evidence of pulmonic stenosis.   Aorta: The aortic root, ascending aorta, aortic arch and descending aorta  are all structurally normal, with no evidence of dilitation or  obstruction. There is minimal (Grade I) plaque.   Venous: A systolic blunting flow pattern is recorded from the left upper  pulmonary vein and the right upper pulmonary vein.   IAS/Shunts: No atrial level shunt detected by color flow Doppler.     MR Vena Contracta: 0.40 cm TRICUSPID VALVE                             TR Peak grad:   27.9 mmHg                             TR Vmax:        264.00 cm/s    CATH (02/2023) Conclusion      Prox LAD to Mid LAD lesion is 30% stenosed.   1.  Mild nonobstructive coronary artery disease. 2.  Fick cardiac output of 6.1 L/min and Fick cardiac index of 2.9 L/min/m with the following hemodynamics:             Right atrial pressure mean of 13 mmHg             Right ventricular pressure 35/3 with end-diastolic pressure of 13 mmHg             Wedge pressure mean of 20 mmHg with V waves to 25 mmHg             Pulmonary artery pressure 38/21 with a mean of 29 mmHg             PVR of 0.66 Woods units             PA pulsatility index of 1.3 3.  LVEDP of 23 mmHg  Recent Radiology Findings:       Recent Lab Findings: Lab Results  Component Value Date   WBC 5.7 02/06/2023   HGB 13.3 02/27/2023   HCT 39.0 02/27/2023   PLT 228 02/06/2023   GLUCOSE 76 03/10/2023   CHOL 111 12/06/2022   TRIG 98 12/06/2022   HDL 28 (L) 12/06/2022   LDLCALC 65 12/06/2022   ALT 19 12/06/2022   AST 17 12/06/2022   NA 146 (H) 03/10/2023   K 4.4 03/10/2023   CL 105 03/10/2023   CREATININE 0.97 03/10/2023   BUN 17 03/10/2023   CO2 24 03/10/2023   TSH 1.89 05/20/2022   INR 1.19 07/10/2009   HGBA1C (H) 07/10/2009    6.2 (NOTE) The ADA recommends the following therapeutic goal for glycemic control related to Hgb A1c  measurement: Goal of therapy: <6.5 Hgb A1c  Reference: American Diabetes Association: Clinical Practice Recommendations 2010, Diabetes Care, 2010, 33: (Suppl  1).      Assessment / Plan:    77 yo male with NYHA class 1 symptoms of severe MR with moderate TR and slightly reduced LV function. He has chronic afib and no PHTN nor CAD. He is at elevated risk for MV and TV repair and MAZE due to his age, reduced LV function and previous CVA. We discussed the surgical indications and plan with risks and recovery and he is concerned and is feeling the mitral clip may be what he feels better persuing. I will discuss with  Dr [name]    I have spent 60 min in review of the records, viewing studies and in face to face with patient and in coordination of future care    Eugenio Hoes 03/17/2023 4:19 PM

## 2023-03-18 ENCOUNTER — Encounter: Payer: Self-pay | Admitting: Thoracic Surgery (Cardiothoracic Vascular Surgery)

## 2023-03-18 ENCOUNTER — Institutional Professional Consult (permissible substitution): Payer: Medicare Other | Admitting: Thoracic Surgery (Cardiothoracic Vascular Surgery)

## 2023-03-18 ENCOUNTER — Telehealth: Payer: Self-pay

## 2023-03-18 VITALS — BP 136/93 | HR 60 | Resp 18 | Ht 70.0 in | Wt 209.0 lb

## 2023-03-18 DIAGNOSIS — I071 Rheumatic tricuspid insufficiency: Secondary | ICD-10-CM | POA: Diagnosis not present

## 2023-03-18 DIAGNOSIS — I340 Nonrheumatic mitral (valve) insufficiency: Secondary | ICD-10-CM

## 2023-03-18 NOTE — Patient Instructions (Signed)
Follow up when decides if wants surgery

## 2023-03-18 NOTE — Telephone Encounter (Signed)
Per [facility] MitraClip review,  "For patient [name] [name]: This is Primary MR  This valve appears to suitable for a MitraClip implant. In the Bicaval and SAXB views, the fossa looks reasonable for a transseptal puncture. TR is noted. LA dimensions are just large enough for device steering and straddle. P2 is prolapsed (with possible flail) and is causing an anteriorly directed jet. The posterior leaflet is greater than 15 mm in the 136 LVOT grasping view. Gradient and MVA were not measured and I'd like to verify both in the procedure prior to the case start. Based on this information, I'd recommend starting with an XTW if we feel like we can get enough height from the transseptal puncture."

## 2023-03-20 ENCOUNTER — Encounter: Payer: Medicare Other | Admitting: Thoracic Surgery (Cardiothoracic Vascular Surgery)

## 2023-03-24 NOTE — Progress Notes (Signed)
Patient ID: [name] [name] MRN: [id number] DOB/AGE: May 06, 1945 77 y.o.  Primary Care Physician:[name], [name], MD Primary Cardiologist: Alverda Skeans, MD Electrophysiologist: Lewayne Bunting, MD  FOCUSED CARDIOVASCULAR PROBLEM LIST:   Permanent atrial fibrillation on Xarelto Atrial flutter ablation 2012 Hyperlipidemia Hypertension Sinus node dysfunction BMI 30 CKD stage II TIA 2011 thought 2/2 atrial flutter Mitral regurgitation Moderate to severe TTE 2024 Tricuspid regurgitation Moderate TTE 2024  HISTORY OF PRESENT ILLNESS:  October 2024: The patient is a 77 y.o. male with the indicated medical history here for recommendations regarding mitral regurgitation.  The patient is usually followed by the EP service.  He was seen recently and was doing well aside from fatigue that he has noticed.  He had an echocardiogram done which demonstrated significant mitral regurgitation thought to be moderate to severe with a posterior wall hugging jet.  Patient also has at least moderate tricuspid regurgitation.  He has biatrial enlargement.    The patient is here to today by himself.  He is taking care of his wife who is recuperating from some health issues.  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  He has noticed increasing fatigue with activities that he could do last year without any issues.  For example he can no longer take a ditch like he could last year without having to stop and rest.  He has noticed no significant shortness of breath per se.  He does not really feel palpitations.  He has had no bleeding issues while on Eliquis.  He really has not noticed any severe peripheral edema and denies any paroxysmal nocturnal dyspnea.  He has not required any emergency room visits or hospitalizations.  He is somewhat limited from Achilles tendon issue in terms of ambulating at a rapid rate.  He does see a dentist on a regular basis.  He has a few root canals and fillings that need to be  performed in the near future.  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  Plan: Refer for TEE.  November 2024: In the interim the patient had a TEE which demonstrated severe mitral regurgitation due to P2 prolapse. He also has moderate tricuspid regurgitation.  His ejection fraction was mildly reduced.  He had a limited echocardiogram today which demonstrated continued severe degenerative mitral regurgitation at least moderate tricuspid regurgitation.  He continues to do well.  He has noted some lightheadedness when he goes from sitting to standing quickly.  He is not feeling shortness of breath or palpitations.  He denies any heart failure symptoms.  He has had no severe bleeding or bruising.  He has not required any emergency room visits or hospitalizations.  He is still caring for his wife who is getting over orthopedic issues and may require another procedure.  Plan: Decrease Cozaar to 25 mg, stop diltiazem due to mild LV dysfunction, and start Toprol-XL 50 mg daily; refer for right heart catheterization and coronary angiography study; refer to Dr. Leafy Ro for surgical opinion.  12/24: The patient returns for follow-up.  He is coronary angiography study demonstrated mild nonobstructive disease.  Right heart catheterization demonstrated V waves to 25 mmHg with a mean wedge pressure of 20 mmHg.  His cardiac output and index were preserved.  His LVEDP was 23 mmHg.  He was started on Lasix 20 mg daily.  He was seen by Dr. Leafy Ro.  Given his advanced age, mildly reduced ejection fraction, and history of stroke he was thought  to be at elevated risk for surgical tricuspid repair, mitral valve repair, and maze procedure.  He is here to discuss further.  The patient is relatively unchanged.  He is gets somewhat fatigued when he exerts himself more than moderately.  In regards to his wife's health that she is recuperating well and doing much better.  He does not think her recuperation would  limit him from recovering from surgery.  He is still on the fence about what he would like to pursue.  He has done a lot of research regarding Maze procedure, left atrial appendage ligation concomitant with mitral valve and tricuspid valve surgery.  He has yet to have his root canals performed as the dentist is booking far out.  Past Medical History:  Diagnosis Date   CVA (cerebral infarction) 07-14-09   Dysrhythmia    History of kidney stones    Hyperlipidemia    Hypertension    Nephrolithiasis    Paroxysmal atrial fibrillation (HCC)    Paroxysmal atrial flutter (HCC)    a. s/p ablation 2012.   Prostate cancer [facility])    Stroke [facility])     Past Surgical History:  Procedure Laterality Date   APPENDECTOMY     BACK SURGERY     CYSTOSCOPY/URETEROSCOPY/HOLMIUM LASER/STENT PLACEMENT Left 09/19/2020   Procedure: CYSTOSCOPY RETROGRADE PYELOGRAM URETEROSCOPY/HOLMIUM LASER/STENT PLACEMENT;  Surgeon: Bjorn Pippin, MD;  Location: [facility] ORS;  Service: Urology;  Laterality: Left;   EYE SURGERY     PROSTATECTOMY  2005   RIGHT/LEFT HEART CATH AND CORONARY ANGIOGRAPHY N/A 02/27/2023   Procedure: RIGHT/LEFT HEART CATH AND CORONARY ANGIOGRAPHY;  Surgeon: [name], MD;  Location: [facility] INVASIVE CV LAB;  Service: Cardiovascular;  Laterality: N/A;   TEE WITHOUT CARDIOVERSION N/A 02/07/2023   Procedure: TRANSESOPHAGEAL ECHOCARDIOGRAM;  Surgeon: Thurmon Fair, MD;  Location: [facility] INVASIVE CV LAB;  Service: Cardiovascular;  Laterality: N/A;    Family History  Problem Relation Age of Onset   Heart disease Mother 22       died   Prostate cancer Father        alive   Testicular cancer Brother        died    Social History   Socioeconomic History   Marital status: Married    Spouse name: Not on file   Number of children: 2   Years of education: Not on file   Highest education level: Not on file  Occupational History   Occupation: Personnel officer: [employer]  Tobacco Use   Smoking  status: Never   Smokeless tobacco: Never  Vaping Use   Vaping status: Never Used  Substance and Sexual Activity   Alcohol use: No   Drug use: No   Sexual activity: Not on file  Other Topics Concern   Not on file  Social History Narrative   Not on file   Social Drivers of Health   Financial Resource Strain: Low Risk  (10/31/2022)   Overall Financial Resource Strain (CARDIA)    Difficulty of Paying Living Expenses: Not hard at all  Food Insecurity: No Food Insecurity (10/31/2022)   Hunger Vital Sign    Worried About Running Out of Food in the Last Year: Never true    Ran Out of Food in the Last Year: Never true  Transportation Needs: No Transportation Needs (10/31/2022)   PRAPARE - Administrator, Civil Service (Medical): No    Lack of Transportation (Non-Medical): No  Physical Activity: Insufficiently  Active (10/31/2022)   Exercise Vital Sign    Days of Exercise per Week: 3 days    Minutes of Exercise per Session: 20 min  Stress: No Stress Concern Present (10/31/2022)   [employer] of Occupational Health - Occupational Stress Questionnaire    Feeling of Stress : Not at all  Social Connections: Socially Integrated (10/31/2022)   Social Connection and Isolation Panel [NHANES]    Frequency of Communication with Friends and Family: More than three times a week    Frequency of Social Gatherings with Friends and Family: Three times a week    Attends Religious Services: More than 4 times per year    Active Member of Clubs or Organizations: Yes    Attends Banker Meetings: 1 to 4 times per year    Marital Status: Married  Catering manager Violence: Not At Risk (10/31/2022)   Humiliation, Afraid, Rape, and Kick questionnaire    Fear of Current or Ex-Partner: No    Emotionally Abused: No    Physically Abused: No    Sexually Abused: No     Prior to Admission medications   Medication Sig Start Date End Date Taking? Authorizing Provider  atorvastatin  (LIPITOR) 20 MG tablet TAKE 1 TABLET(20 MG) BY MOUTH DAILY 05/20/22   [name], MD  diltiazem (CARDIZEM CD) 180 MG 24 hr capsule Take 1 capsule (180 mg total) by mouth daily. 01/09/23   Graciella Freer, PA-C  losartan (COZAAR) 50 MG tablet TAKE 1 TABLET(50 MG) BY MOUTH DAILY 07/24/22   [name], MD  rivaroxaban (XARELTO) 20 MG TABS tablet Take 1 tablet (20 mg total) by mouth daily with supper. 07/29/22   Marinus Maw, MD  traMADol (ULTRAM) 50 MG tablet TAKE 1 TABLET BY MOUTH EVERY 6 HOURS AS NEEDED. 09/04/22   [name], MD    No Known Allergies  REVIEW OF SYSTEMS:  General: no fevers/chills/night sweats Eyes: no blurry vision, diplopia, or amaurosis ENT: no sore throat or hearing loss Resp: no cough, wheezing, or hemoptysis CV: no edema or palpitations GI: no abdominal pain, nausea, vomiting, diarrhea, or constipation GU: no dysuria, frequency, or hematuria Skin: no rash Neuro: no headache, numbness, tingling, or weakness of extremities Musculoskeletal: no joint pain or swelling Heme: no bleeding, DVT, or easy bruising Endo: no polydipsia or polyuria  BP 120/84 (BP Location: Left Arm, Patient Position: Sitting, Cuff Size: Large)   Pulse (!) 135   Ht 5\' 10"  (1.778 m)   Wt 212 lb (96.2 kg)   SpO2 96%   BMI 30.42 kg/m   PHYSICAL EXAM: GEN:  AO x 3 in no acute distress HEENT: normal Dentition: Normal Neck: JVP normal. +2 carotid upstrokes without bruits. No thyromegaly. Lungs: equal expansion, clear bilaterally CV: Apex is discrete and nondisplaced, regular rate and rhythm with 3 out of 6 holosystolic murmur best heard at the axilla Abd: soft, non-tender, non-distended; no bruit; positive bowel sounds Ext: no edema, ecchymoses, or cyanosis Vascular: 2+ femoral pulses, 2+ radial pulses       Skin: warm and dry without rash Neuro: CN II-XII grossly intact; motor and sensory grossly intact    DATA AND STUDIES:  EKG: EKG performed October 2024  that I personally reviewed demonstrates atrial fibrillation with rapid ventricular rate and incomplete right bundle branch block  EKG Interpretation Date/Time:  Monday March 31 2023 09:01:20 EST Ventricular Rate:  135 PR Interval:    QRS Duration:  102 QT Interval:  330  QTC Calculation: 495 R Axis:   -68  Text Interpretation: Atrial fibrillation with rapid ventricular response Left axis deviation Incomplete right bundle branch block When compared with ECG of 10-Mar-2023 14:31, Incomplete right bundle branch block is now Present Confirmed by Alverda Skeans ([id number]) on 03/31/2023 9:02:37 AM        Cardiac Studies & Procedures   CARDIAC CATHETERIZATION  CARDIAC CATHETERIZATION 02/27/2023  Narrative   Prox LAD to Mid LAD lesion is 30% stenosed.  1.  Mild nonobstructive coronary artery disease. 2.  Fick cardiac output of 6.1 L/min and Fick cardiac index of 2.9 L/min/m with the following hemodynamics: Right atrial pressure mean of 13 mmHg Right ventricular pressure 35/3 with end-diastolic pressure of 13 mmHg Wedge pressure mean of 20 mmHg with V waves to 25 mmHg Pulmonary artery pressure 38/21 with a mean of 29 mmHg PVR of 0.66 Woods units PA pulsatility index of 1.3 3.  LVEDP of 23 mmHg  Recommendation: Cardiothoracic surgical opinion regarding mitral and tricuspid valvular disease.  Start lasix 20mg  daily with BMP in one week.  Findings Coronary Findings Diagnostic  Dominance: Right  Left Anterior Descending There is mild diffuse disease throughout the vessel. Prox LAD to Mid LAD lesion is 30% stenosed.  Left Circumflex The vessel exhibits minimal luminal irregularities.  Right Coronary Artery The vessel exhibits minimal luminal irregularities.  Intervention  No interventions have been documented.    ECHOCARDIOGRAM  ECHOCARDIOGRAM LIMITED 02/14/2023  Narrative ECHOCARDIOGRAM LIMITED REPORT    Patient Name:   MAKOTO FORSBERG Date of Exam: 02/14/2023 Medical  Rec #:  [id number]      Height:       70.0 in Accession #:    [id number]     Weight:       209.0 lb Date of Birth:  [birth year]       BSA:          2.127 m Patient Age:    77 years       BP:           95/74 mmHg Patient Gender: M              HR:           82 bpm. Exam Location:  [address]  Procedure: Limited Echo, Limited Color Doppler, Cardiac Doppler and 3D Echo  Indications:    Atrial Fibrillation I48.91  History:        Patient has prior history of Echocardiogram examinations, most recent 02/07/2023.  Sonographer:    Thurman Coyer RDCS Referring Phys: [id number] Charlies Constable [facility]  IMPRESSIONS   1. Limited f/u echo for MR and EF no subcostal images. 2. Left ventricular ejection fraction, by estimation, is 55%. Left ventricular diastolic parameters are indeterminate. 3. Left atrial size was severely dilated. 4. Right atrial size was severely dilated. 5. Eccentric anteriorly directed severe MR likely atrial FMR and some posterior leaflet prolapse . The mitral valve is abnormal. Severe mitral valve regurgitation. 6. Tricuspid valve regurgitation is moderate. 7. The aortic valve is tricuspid. There is mild calcification of the aortic valve. Aortic valve sclerosis is present, with no evidence of aortic valve stenosis. 8. Aortic dilatation noted. There is mild dilatation of the aortic root, measuring 39 mm.  FINDINGS Left Ventricle: Left ventricular ejection fraction, by estimation, is 55%. The left ventricular internal cavity size was normal in size. There is no left ventricular hypertrophy. Left ventricular diastolic parameters are indeterminate.  Left Atrium: Left atrial size was severely dilated.  Right Atrium: Right atrial size was severely dilated.  Mitral Valve: Eccentric anteriorly directed severe MR likely atrial FMR and some posterior leaflet prolapse. The mitral valve is abnormal. There is mild thickening of the mitral valve leaflet(s). There is mild calcification of the  mitral valve leaflet(s). Severe mitral valve regurgitation.  Tricuspid Valve: The tricuspid valve is normal in structure. Tricuspid valve regurgitation is moderate.  Aortic Valve: The aortic valve is tricuspid. There is mild calcification of the aortic valve. Aortic valve sclerosis is present, with no evidence of aortic valve stenosis.  Aorta: Aortic dilatation noted. There is mild dilatation of the aortic root, measuring 39 mm.  Additional Comments: Limited f/u echo for MR and EF no subcostal images.  LEFT VENTRICLE PLAX 2D LVIDd:         5.30 cm LVIDs:         3.80 cm LV PW:         1.10 cm LV IVS:        1.10 cm LVOT diam:     2.30 cm   3D Volume EF: LVOT Area:     4.15 cm  3D EF:        55 % LV EDV:       181 ml LV ESV:       82 ml LV SV:        99 ml  LEFT ATRIUM         Index LA diam:    6.00 cm 2.82 cm/m  AORTA Ao Root diam: 3.90 cm  MR Peak grad:    86.1 mmHg MR Mean grad:    62.0 mmHg    SHUNTS MR Vmax:         464.00 cm/s  Systemic Diam: 2.30 cm MR Vmean:        376.0 cm/s MR PISA:         7.60 cm MR PISA Eff ROA: 52 mm MR PISA Radius:  1.10 cm  Charlton Haws MD Electronically signed by Charlton Haws MD Signature Date/Time: 02/14/2023/3:03:00 PM    Final  TEE  ECHO TEE 02/07/2023  Narrative TRANSESOPHOGEAL ECHO REPORT    Patient Name:   FLEMING MATEL Date of Exam: 02/07/2023 Medical Rec #:  [id number]      Height:       70.0 in Accession #:    [id number]     Weight:       209.0 lb Date of Birth:  1946/01/10       BSA:          2.127 m Patient Age:    77 years       BP:           121/83 mmHg Patient Gender: M              HR:           152 bpm. Exam Location:  Inpatient  Procedure: Transesophageal Echo, Color Doppler and Cardiac Doppler  Indications:     Mitral valve insufficiency, unspecified etiology [I34.0] - Primary  History:         Patient has prior history of Echocardiogram examinations, most recent 12/31/2022. Stroke, Arrythmias:Atrial  Fibrillation; Risk Factors:Hypertension and Dyslipidemia.  Sonographer:     Lucendia Herrlich RCS Sonographer#2:   Delcie Roch RDCS Referring Phys:  [id number] MIHAI CROITORU Diagnosing Phys: Thurmon Fair MD  PROCEDURE: After discussion of the risks and benefits of a TEE, an informed consent was obtained from the patient. The  transesophogeal probe was passed without difficulty through the esophogus of the patient. Imaged were obtained with the patient in a left lateral decubitus position. Sedation performed by different physician. The patient was monitored while under deep sedation. Anesthestetic sedation was provided intravenously by Anesthesiology: 230mg  of Propofol, 50mg  of Lidocaine. Image quality was technically difficult. The patient developed no complications during the procedure. During the study the rhythm was atrail fibrillation with ventricular rate of 150-160 bpm, which affects image quality.  IMPRESSIONS   1. Left ventricular ejection fraction, by estimation, is 45 to 50%. The left ventricle has mildly decreased function. The left ventricle demonstrates global hypokinesis. Left ventricular diastolic function could not be evaluated. 2. Right ventricular systolic function is low normal. The right ventricular size is mildly enlarged. There is mildly elevated pulmonary artery systolic pressure. The estimated right ventricular systolic pressure is 32.9 mmHg. 3. Left atrial size was severely dilated. No left atrial/left atrial appendage thrombus was detected. 4. Right atrial size was moderately dilated. 5. Mitral valve area by planimetry is >8 cm sq. Gradients could not be accurately obtained due to extreme tachycardia. There are 2 distinct jets of mitral insufficiency: the larger is due to P2 prolapse, with an eccentric anteriorly directed wall-hugging jet. The smaller is at the P2-P3 junction and has a central jet. The vena contracta of the major mitral insufficiency jet was 4 mm and  the PISA radius was 1.0 cm (Nyquist limit 39 cm/s). At the area of maximum prolapse the length of the posterior leaflet is 16 mm. The leaflets are only mildly thickened. The mitral valve is myxomatous. Severe mitral valve regurgitation. There is severe holosystolic prolapse of the middle scallop of the posterior leaflet of the mitral valve. 6. The tricuspid valve is myxomatous. Tricuspid valve regurgitation is moderate. 7. The aortic valve is tricuspid. There is mild thickening of the aortic valve. Aortic valve regurgitation is not visualized. Aortic valve sclerosis is present, with no evidence of aortic valve stenosis.  FINDINGS Left Ventricle: Left ventricular ejection fraction, by estimation, is 45 to 50%. The left ventricle has mildly decreased function. The left ventricle demonstrates global hypokinesis. The left ventricular internal cavity size was normal in size. Left ventricular diastolic function could not be evaluated due to atrial fibrillation. Left ventricular diastolic function could not be evaluated.  Right Ventricle: The right ventricular size is mildly enlarged. No increase in right ventricular wall thickness. Right ventricular systolic function is low normal. There is mildly elevated pulmonary artery systolic pressure. The tricuspid regurgitant velocity is 2.64 m/s, and with an assumed right atrial pressure of 5 mmHg, the estimated right ventricular systolic pressure is 32.9 mmHg.  Left Atrium: Left atrial size was severely dilated. No left atrial/left atrial appendage thrombus was detected.  Right Atrium: Right atrial size was moderately dilated.  Pericardium: Trivial pericardial effusion is present.  Mitral Valve: Mitral valve area by planimetry is >8 cm sq. Gradients could not be accurately obtained due to extreme tachycardia. There are 2 distinct jets of mitral insufficiency: the larger is due to P2 prolapse, with an eccentric anteriorly directed wall-hugging jet. The smaller  is at the P2-P3 junction and has a central jet. The vena contracta of the major mitral insufficiency jet was 4 mm and the PISA radius was 1.0 cm (Nyquist limit 39 cm/s). At the area of maximum prolapse the length of the posterior leaflet is 16 mm. The leaflets are only mildly thickened. The mitral valve is myxomatous. There is severe holosystolic prolapse of the  middle scallop of the posterior leaflet of the mitral valve. Severe mitral valve regurgitation, with eccentric anteriorly directed jet.  Tricuspid Valve: The tricuspid valve is myxomatous. Tricuspid valve regurgitation is moderate. There is mild prolapse of the tricuspid.  Aortic Valve: The aortic valve is tricuspid. There is mild thickening of the aortic valve. Aortic valve regurgitation is not visualized. Aortic valve sclerosis is present, with no evidence of aortic valve stenosis.  Pulmonic Valve: The pulmonic valve was normal in structure. Pulmonic valve regurgitation is mild. No evidence of pulmonic stenosis.  Aorta: The aortic root, ascending aorta, aortic arch and descending aorta are all structurally normal, with no evidence of dilitation or obstruction. There is minimal (Grade I) plaque.  Venous: A systolic blunting flow pattern is recorded from the left upper pulmonary vein and the right upper pulmonary vein.  IAS/Shunts: No atrial level shunt detected by color flow Doppler.   MR Vena Contracta: 0.40 cm TRICUSPID VALVE TR Peak grad:   27.9 mmHg TR Vmax:        264.00 cm/s  Rachelle Hora Croitoru MD Electronically signed by Thurmon Fair MD Signature Date/Time: 02/07/2023/1:16:08 PM    Final  MONITORS  LONG TERM MONITOR (3-14 DAYS) 09/03/2022  Narrative Atrial fib with a slow VR (down to 45/min), controlled VR (ave HR 98/min) and RVR with max HR of 184/min. Rare PVC's with couplets No VT No prolonged pauses  Gregg Taylor,MD  Patch Wear Time:  8 days and 8 hours (2024-05-10T22:29:43-0400 to  2024-05-19T06:38:08-398)  Atrial Fibrillation occurred continuously (100% burden), ranging from 45-184 bpm (avg of 98 bpm). Isolated VEs were rare (<1.0%), VE Couplets were rare (<1.0%), and no VE Triplets were present. Ventricular Trigeminy was present.           05/20/2022: TSH 1.89 12/06/2022: ALT 19 02/06/2023: Platelets 228 02/27/2023: Hemoglobin 13.3 03/10/2023: BUN 17; Creatinine, Ser 0.97; Potassium 4.4; Sodium 146   STS RISK CALCULATOR: Pending  NHYA CLASS: 2     ASSESSMENT AND PLAN:   Nonrheumatic mitral valve regurgitation - Plan: EKG 12-Lead  Nonrheumatic tricuspid valve regurgitation - Plan: EKG 12-Lead  Sinus node dysfunction (HCC) - Plan: EKG 12-Lead  Permanent atrial fibrillation (HCC) - Plan: EKG 12-Lead  Essential hypertension - Plan: EKG 12-Lead  Hyperlipidemia LDL goal <70 - Plan: EKG 12-Lead  Mild CAD - Plan: EKG 12-Lead  The patient has severe degenerative mitral regurgitation.  His anatomy seems conducive to a mitral transcatheter edge-to-edge repair.  I had a long conversation with the patient about his other comorbidities including tricuspid valve regurgitation, and atrial fibrillation.  As noted previously these would not be able to be addressed during a mitral transcatheter edge-to-edge repair.  Certainly if the mitral transcatheter edge-to-edge repair were to be pursued in the tricuspid valve needed to be addressed later we could potentially pursue what tricuspid transcatheter edge-to-edge repair next year after we start that program.  The patient had a lot of questions about the decision between surgery and isolated mitral transcatheter edge-to-edge repair which were answered.  In addition to the standard risks perioperatively for someone with his advanced age and comorbidities he is at increased risk for requiring a permanent pacemaker given his history of sinus node dysfunction.  It would be reasonable to pursue what mitral transcatheter edge-to-edge  repair in isolation.  It would also be reasonable to pursue surgical mitral valve repair, maze procedure, left atrial appendage ligation, and tricuspid valve repair.  At this point in time the patient will take a few weeks  to decide which treatment modality he would like to pursue.  I have asked him to continue to work on getting his root canals performed.  I will otherwise see him back in 6 months for routine follow-up.  I have personally reviewed the patients imaging data as summarized above.  I have reviewed the natural history of mitral regurgitation with the patient and family members who are present today. We have discussed the limitations of medical therapy and the poor prognosis associated with symptomatic mitral regurgitation. We have also reviewed potential treatment options, including palliative medical therapy, conventional mitral surgery, and transcatheter mitral edge-to-edge repair. We discussed treatment options in the context of this patient's specific comorbid medical conditions.   All of the patient's questions were answered today. Will make further recommendations based on the results of studies outlined above.   I spent 55 minutes reviewing all clinical data during and prior to this visit including all relevant imaging studies, laboratories, clinical information from other health systems, and prior notes from both Cardiology and other specialties, interviewing the patient, and conducting a complete physical examination in order to formulate a comprehensive and personalized evaluation and treatment plan.   [name], MD  03/31/2023 9:39 AM    [facility] [address] [place], [place], [place]  [address] Phone: [phone]; Fax: [phone]

## 2023-03-31 ENCOUNTER — Ambulatory Visit: Payer: Medicare Other | Attending: Internal Medicine | Admitting: Internal Medicine

## 2023-03-31 ENCOUNTER — Encounter: Payer: Self-pay | Admitting: Internal Medicine

## 2023-03-31 VITALS — BP 120/84 | HR 135 | Ht 70.0 in | Wt 212.0 lb

## 2023-03-31 DIAGNOSIS — I2510 Atherosclerotic heart disease of native coronary artery without angina pectoris: Secondary | ICD-10-CM

## 2023-03-31 DIAGNOSIS — I4821 Permanent atrial fibrillation: Secondary | ICD-10-CM

## 2023-03-31 DIAGNOSIS — I495 Sick sinus syndrome: Secondary | ICD-10-CM

## 2023-03-31 DIAGNOSIS — I361 Nonrheumatic tricuspid (valve) insufficiency: Secondary | ICD-10-CM

## 2023-03-31 DIAGNOSIS — E785 Hyperlipidemia, unspecified: Secondary | ICD-10-CM

## 2023-03-31 DIAGNOSIS — I340 Nonrheumatic mitral (valve) insufficiency: Secondary | ICD-10-CM | POA: Diagnosis not present

## 2023-03-31 DIAGNOSIS — I10 Essential (primary) hypertension: Secondary | ICD-10-CM

## 2023-03-31 NOTE — Patient Instructions (Signed)
Medication Instructions:  No changes *If you need a refill on your cardiac medications before your next appointment, please call your pharmacy*   Lab Work: none If you have labs (blood work) drawn today and your tests are completely normal, you will receive your results only by: MyChart Message (if you have MyChart) OR A paper copy in the mail If you have any lab test that is abnormal or we need to change your treatment, we will call you to review the results.   Testing/Procedures: none   Follow-Up: At [facility], you and your health needs are our priority.  As part of our continuing mission to provide you with exceptional heart care, we have created designated Provider Care Teams.  These Care Teams include your primary Cardiologist (physician) and Advanced Practice Providers (APPs -  Physician Assistants and Nurse Practitioners) who all work together to provide you with the care you need, when you need it.   Your next appointment:   6 month(s)  Provider:   [name], MD

## 2023-04-04 ENCOUNTER — Telehealth: Payer: Self-pay

## 2023-04-04 NOTE — Telephone Encounter (Signed)
Per Dr. Trula Ore 03/31/2023 office note, "It would be reasonable to pursue what mitral transcatheter edge-to-edge repair in isolation. It would also be reasonable to pursue surgical mitral valve repair, maze procedure, left atrial appendage ligation, and tricuspid valve repair. At this point in time the patient will take a few weeks to decide which treatment modality he would like to pursue. I have asked him to continue to work on getting his root canals performed. I will otherwise see him back in 6 months for routine follow-up."   The patient called to report he has made the decision to have surgery instead of transcatheter repair.  He is scheduled for dental work (root canal) 04/11/23 and may require another date for for dental work if they do not do both required root canals that day. He understood [facility] will call to discuss plan.

## 2023-04-07 ENCOUNTER — Encounter: Payer: Self-pay | Admitting: Family Medicine

## 2023-04-07 ENCOUNTER — Ambulatory Visit
Admission: RE | Admit: 2023-04-07 | Discharge: 2023-04-07 | Disposition: A | Discharge status: Home / Self Care | Payer: Medicare Other | Source: Ambulatory Visit | Attending: Family Medicine | Admitting: Family Medicine

## 2023-04-07 ENCOUNTER — Ambulatory Visit (INDEPENDENT_AMBULATORY_CARE_PROVIDER_SITE_OTHER): Payer: Medicare Other | Admitting: Family Medicine

## 2023-04-07 VITALS — BP 122/80 | HR 97 | Temp 98.4°F | Ht 70.0 in | Wt 209.0 lb

## 2023-04-07 DIAGNOSIS — N200 Calculus of kidney: Secondary | ICD-10-CM | POA: Diagnosis not present

## 2023-04-07 DIAGNOSIS — Z1329 Encounter for screening for other suspected endocrine disorder: Secondary | ICD-10-CM | POA: Diagnosis not present

## 2023-04-07 DIAGNOSIS — K529 Noninfective gastroenteritis and colitis, unspecified: Secondary | ICD-10-CM

## 2023-04-07 DIAGNOSIS — K5900 Constipation, unspecified: Secondary | ICD-10-CM | POA: Diagnosis not present

## 2023-04-07 NOTE — Progress Notes (Signed)
Subjective:    Patient ID: [name] [name], male    DOB: [birth year], 77 y.o.   MRN: [id number]  HPI Patient is a 77 year old patient gentleman history of atrial fibrillation.  He is currently rate on Cardizem and anticoagulated with Xarelto.  He presents today for diarrhea.  He states for the last 4 to 5 months he has been having poorly formed stools.  This occurs 2-3 times a day.  Some will be mushy and sticky and loose, but a small amount.  Others will be small balls about the size of a grape.  Symptoms sound like he is not fully evacuating.  He denies any abdominal pain, melena, hematochezia.  He denies any abdominal cramps.  He denies any fevers or chills.  He denies any nausea or vomiting.  He is on tramadol but he states that he takes this about every 3 to 4 days.  He is not taking it frequently. Past Medical History:  Diagnosis Date   CVA (cerebral infarction) 07-14-09   Dysrhythmia    History of kidney stones    Hyperlipidemia    Hypertension    Nephrolithiasis    Paroxysmal atrial fibrillation (HCC)    Paroxysmal atrial flutter (HCC)    a. s/p ablation 2012.   Prostate cancer [facility])    Stroke [facility])    Past Surgical History:  Procedure Laterality Date   APPENDECTOMY     BACK SURGERY     CYSTOSCOPY/URETEROSCOPY/HOLMIUM LASER/STENT PLACEMENT Left 09/19/2020   Procedure: CYSTOSCOPY RETROGRADE PYELOGRAM URETEROSCOPY/HOLMIUM LASER/STENT PLACEMENT;  Surgeon: Bjorn Pippin, MD;  Location: [facility];  Service: Urology;  Laterality: Left;   EYE SURGERY     PROSTATECTOMY  2005   RIGHT/LEFT HEART CATH AND CORONARY ANGIOGRAPHY N/A 02/27/2023   Procedure: RIGHT/LEFT HEART CATH AND CORONARY ANGIOGRAPHY;  Surgeon: [name], MD;  [facility];  Service: Cardiovascular;  Laterality: N/A;   TEE WITHOUT CARDIOVERSION N/A 02/07/2023   Procedure: TRANSESOPHAGEAL ECHOCARDIOGRAM;  Surgeon: Thurmon Fair, MD;  Location: [facility] CV LAB;  Service: Cardiovascular;  Laterality: N/A;    Current Outpatient Medications on File Prior to Visit  Medication Sig Dispense Refill   atorvastatin (LIPITOR) 20 MG tablet TAKE 1 TABLET(20 MG) BY MOUTH DAILY (Patient taking differently: Take 20 mg by mouth every evening.) 90 tablet 3   furosemide (LASIX) 20 MG tablet Take 1 tablet (20 mg total) by mouth daily. 30 tablet 11   losartan (COZAAR) 25 MG tablet Take 1 tablet (25 mg total) by mouth daily. (Patient taking differently: Take 25 mg by mouth every evening.) 90 tablet 3   metoprolol succinate (TOPROL-XL) 50 MG 24 hr tablet Take 1 tablet (50 mg total) by mouth at bedtime. Take with or immediately following a meal. (Patient taking differently: Take 75 mg by mouth at bedtime. Take with or immediately following a meal.) 90 tablet 3   rivaroxaban (XARELTO) 20 MG TABS tablet TAKE 1 TABLET BY MOUTH ONCE DAILY WITH SUPPER 90 tablet 1   traMADol (ULTRAM) 50 MG tablet TAKE 1 TABLET BY MOUTH EVERY 6 HOURS AS NEEDED. 120 tablet 0   No current facility-administered medications on file prior to visit.     No Known Allergies Social History   Socioeconomic History   Marital status: Married    Spouse name: Not on file   Number of children: 2   Years of education: Not on file   Highest education level: Not on file  Occupational History   Occupation: Administrator, sports  maker    Employer: [employer]  Tobacco Use   Smoking status: Never   Smokeless tobacco: Never  Vaping Use   Vaping status: Never Used  Substance and Sexual Activity   Alcohol use: No   Drug use: No   Sexual activity: Not on file  Other Topics Concern   Not on file  Social History Narrative   Not on file   Social Drivers of Health   Financial Resource Strain: Low Risk  (10/31/2022)   Overall Financial Resource Strain (CARDIA)    Difficulty of Paying Living Expenses: Not hard at all  Food Insecurity: No Food Insecurity (10/31/2022)   Hunger Vital Sign    Worried About Running Out of Food in the Last Year: Never true     Ran Out of Food in the Last Year: Never true  Transportation Needs: No Transportation Needs (10/31/2022)   PRAPARE - Administrator, Civil Service (Medical): No    Lack of Transportation (Non-Medical): No  Physical Activity: Insufficiently Active (10/31/2022)   Exercise Vital Sign    Days of Exercise per Week: 3 days    Minutes of Exercise per Session: 20 min  Stress: No Stress Concern Present (10/31/2022)   [employer] of Occupational Health - Occupational Stress Questionnaire    Feeling of Stress : Not at all  Social Connections: Socially Integrated (10/31/2022)   Social Connection and Isolation Panel [NHANES]    Frequency of Communication with Friends and Family: More than three times a week    Frequency of Social Gatherings with Friends and Family: Three times a week    Attends Religious Services: More than 4 times per year    Active Member of Clubs or Organizations: Yes    Attends Banker Meetings: 1 to 4 times per year    Marital Status: Married  Catering manager Violence: Not At Risk (10/31/2022)   Humiliation, Afraid, Rape, and Kick questionnaire    Fear of Current or Ex-Partner: No    Emotionally Abused: No    Physically Abused: No    Sexually Abused: No    Review of Systems     Objective:   Physical Exam Vitals reviewed.  Constitutional:      General: He is not in acute distress.    Appearance: Normal appearance. He is not ill-appearing or toxic-appearing.  Eyes:     General: Lids are normal. No visual field deficit.    Extraocular Movements:     Right eye: Normal extraocular motion and no nystagmus.     Left eye: Normal extraocular motion and no nystagmus.     Conjunctiva/sclera:     Right eye: Right conjunctiva is not injected. No chemosis or exudate.    Left eye: Left conjunctiva is not injected. No chemosis or exudate. Cardiovascular:     Rate and Rhythm: Normal rate and regular rhythm.     Pulses:          Dorsalis pedis pulses  are 2+ on the right side.       Posterior tibial pulses are 2+ on the right side.     Heart sounds: No murmur heard.    No gallop.  Pulmonary:     Effort: Pulmonary effort is normal. No respiratory distress.     Breath sounds: Normal breath sounds. No wheezing, rhonchi or rales.  Abdominal:     General: Abdomen is flat. Bowel sounds are normal. There is no distension.     Palpations: Abdomen  is soft. There is no mass.     Tenderness: There is no abdominal tenderness. There is no guarding.     Hernia: No hernia is present.  Musculoskeletal:     Right lower leg: No edema.     Left lower leg: No edema.     Right foot: Normal range of motion. No deformity or bunion.  Feet:     Right foot:     Skin integrity: Skin integrity normal. No ulcer, blister, skin breakdown, erythema, warmth or callus.  Neurological:     General: No focal deficit present.     Mental Status: He is alert and oriented to person, place, and time.     Cranial Nerves: No cranial nerve deficit.     Motor: No weakness.      Assessment & Plan:  Chronic diarrhea - Plan: CBC with Differential/Platelet, COMPLETE METABOLIC PANEL WITH GFR, TSH, DG Abd 2 Views, CANCELED: Sedimentation rate, CANCELED: Salmonella/Shigella Cult, Campy EIA and Shiga Toxin reflex, CANCELED: Ova and parasite examination, CANCELED: Clostridium difficile Toxin B, Qualitative, Real-Time PCR After listening to the patient's story, I do not feel that he is having diarrhea.  I believe that he is having "blow-by" diarrhea due to constipation and an excessive stool burden essentially "blocking the way".  I have recommended checking a CBC a CMP and a TSH.  I will also get an abdominal x-ray to see if the patient has an excessive stool burden.  If so, I will recommend stool softener and a laxative to effectively cleanse his colon and then start the patient back a fiber supplement.  However if workup is negative, we may consider GI consultation to evaluate for  partial obstruction with a colonoscopy

## 2023-04-08 LAB — COMPLETE METABOLIC PANEL WITH GFR
AG Ratio: 1.6 (calc) (ref 1.0–2.5)
ALT: 18 U/L (ref 9–46)
AST: 17 U/L (ref 10–35)
Albumin: 4.1 g/dL (ref 3.6–5.1)
Alkaline phosphatase (APISO): 76 U/L (ref 35–144)
BUN: 17 mg/dL (ref 7–25)
CO2: 25 mmol/L (ref 20–32)
Calcium: 9.2 mg/dL (ref 8.6–10.3)
Chloride: 105 mmol/L (ref 98–110)
Creat: 1.06 mg/dL (ref 0.70–1.28)
Globulin: 2.6 g/dL (ref 1.9–3.7)
Glucose, Bld: 140 mg/dL — ABNORMAL HIGH (ref 65–99)
Potassium: 4.1 mmol/L (ref 3.5–5.3)
Sodium: 141 mmol/L (ref 135–146)
Total Bilirubin: 0.9 mg/dL (ref 0.2–1.2)
Total Protein: 6.7 g/dL (ref 6.1–8.1)
eGFR: 71 mL/min/{1.73_m2} (ref >=60)

## 2023-04-08 LAB — CBC WITH DIFFERENTIAL/PLATELET
Absolute Lymphocytes: 1027 {cells}/uL (ref 850–3900)
Absolute Monocytes: 496 {cells}/uL (ref 200–950)
Basophils Absolute: 30 {cells}/uL (ref 0–200)
Basophils Relative: 0.5 %
Eosinophils Absolute: 148 {cells}/uL (ref 15–500)
Eosinophils Relative: 2.5 %
HCT: 45.5 % (ref 38.5–50.0)
Hemoglobin: 14.9 g/dL (ref 13.2–17.1)
MCH: 30.2 pg (ref 27.0–33.0)
MCHC: 32.7 g/dL (ref 32.0–36.0)
MCV: 92.1 fL (ref 80.0–100.0)
MPV: 10.6 fL (ref 7.5–12.5)
Monocytes Relative: 8.4 %
Neutro Abs: 4201 {cells}/uL (ref 1500–7800)
Neutrophils Relative %: 71.2 %
Platelets: 190 10*3/uL (ref 140–400)
RBC: 4.94 10*6/uL (ref 4.20–5.80)
RDW: 12 % (ref 11.0–15.0)
Total Lymphocyte: 17.4 %
WBC: 5.9 10*3/uL (ref 3.8–10.8)

## 2023-04-08 LAB — TSH: TSH: 1.83 m[IU]/L (ref 0.40–4.50)

## 2023-04-10 NOTE — Progress Notes (Signed)
  Electrophysiology Office Note:   Date:  04/11/2023  ID:  [name] [name], DOB 1946/02/07, MRN [id number]  Primary Cardiologist: Lurena MARLA Red, MD Electrophysiologist: Danelle Birmingham, MD      History of Present Illness:   [name] [name] is a 78 y.o. male with h/o Persistent AF, SND, and HTN seen today for routine electrophysiology followup.   Seen 01/09/2023 after Mod/Severe MR noted as well as gradually worsening fatigue.   Since last being seen in our clinic the patient reports doing well. Has decided to proceed with surgical repair of MR. Denies current chest pain, edema, or undue SOB. No dizziness or syncope. Has a root canal for this afternoon, may need an additional one later this year pending availability and planning around his valve surgery.   Review of systems complete and found to be negative unless listed in HPI.   EP Information / Studies Reviewed:    EKG is not ordered today. EKG from 03/31/2023 reviewed which showed AF at 135 bpm       Echo 12/31/2022 by PCP showed LVEF 45-50%, mild RV dysfunction, Severe LAE, Mod RAE, MV with bowing and moderate to severe anteriorly directed MR, felt to possible by UNDERestimated   LHC 02/27/2023   Prox LAD to Mid LAD lesion is 30% stenosed.   1.  Mild nonobstructive coronary artery disease. 2.  Fick cardiac output of 6.1 L/min and Fick cardiac index of 2.9 L/min/m with the following hemodynamics:             Right atrial pressure mean of 13 mmHg             Right ventricular pressure 35/3 with end-diastolic pressure of 13 mmHg             Wedge pressure mean of 20 mmHg with V waves to 25 mmHg             Pulmonary artery pressure 38/21 with a mean of 29 mmHg             PVR of 0.66 Woods units             PA pulsatility index of 1.3 3.  LVEDP of 23 mmHg  Physical Exam:   VS:  BP 122/70 (BP Location: Left Arm, Patient Position: Sitting, Cuff Size: Large)   Pulse 98   Ht 5' 10 (1.778 m)   Wt 207 lb 3.2 oz (94 kg)   SpO2 95%   BMI  29.73 kg/m     Wt Readings from Last 3 Encounters:  04/11/23 207 lb 3.2 oz (94 kg)  04/07/23 209 lb (94.8 kg)  03/31/23 212 lb (96.2 kg)     GEN: Well nourished, well developed in no acute distress NECK: No JVD; No carotid bruits CARDIAC: Regular rate and rhythm, no murmurs, rubs, gallops RESPIRATORY:  Clear to auscultation without rales, wheezing or rhonchi  ABDOMEN: Soft, non-tender, non-distended EXTREMITIES:  No edema; No deformity   ASSESSMENT AND PLAN:    MR, Mod to severe Sees Dr. Maryjane next week for surgical consultation.  Dental work today and potentially will need more.   Permanent AF Flecainide  stopped ineffective.  Will follow HRs peri-procedurally  HTN Stable on current regimen   Secondary hypercoagulable state Pt on Xarelto  as above   HFmEF EF 45-50% NYHA II-III symptoms Follow.   Follow up with EP APP in 6 months  Signed, Ozell Prentice Passey, PA-C

## 2023-04-11 ENCOUNTER — Encounter: Payer: Self-pay | Admitting: Student

## 2023-04-11 ENCOUNTER — Ambulatory Visit: Payer: Medicare Other | Attending: Student | Admitting: Student

## 2023-04-11 VITALS — BP 122/70 | HR 98 | Ht 70.0 in | Wt 207.2 lb

## 2023-04-11 DIAGNOSIS — I340 Nonrheumatic mitral (valve) insufficiency: Secondary | ICD-10-CM

## 2023-04-11 DIAGNOSIS — I10 Essential (primary) hypertension: Secondary | ICD-10-CM

## 2023-04-11 DIAGNOSIS — I2510 Atherosclerotic heart disease of native coronary artery without angina pectoris: Secondary | ICD-10-CM

## 2023-04-11 DIAGNOSIS — I495 Sick sinus syndrome: Secondary | ICD-10-CM

## 2023-04-11 DIAGNOSIS — K080 Exfoliation of teeth due to systemic causes: Secondary | ICD-10-CM | POA: Diagnosis not present

## 2023-04-11 DIAGNOSIS — I4821 Permanent atrial fibrillation: Secondary | ICD-10-CM | POA: Diagnosis not present

## 2023-04-11 NOTE — Patient Instructions (Signed)
 Medication Instructions:  Your physician recommends that you continue on your current medications as directed. Please refer to the Current Medication list given to you today. *If you need a refill on your cardiac medications before your next appointment, please call your pharmacy*   Follow-Up: At [facility], you and your health needs are our priority.  As part of our continuing mission to provide you with exceptional heart care, we have created designated Provider Care Teams.  These Care Teams include your primary Cardiologist (physician) and Advanced Practice Providers (APPs -  Physician Assistants and Nurse Practitioners) who all work together to provide you with the care you need, when you need it.  We recommend signing up for the patient portal called "MyChart".  Sign up information is provided on this After Visit Summary.  MyChart is used to connect with patients for Virtual Visits (Telemedicine).  Patients are able to view lab/test results, encounter notes, upcoming appointments, etc.  Non-urgent messages can be sent to your provider as well.   To learn more about what you can do with MyChart, go to [email or web address].    Your next appointment:   6 month(s)  Provider:    Casimiro Needle "Otilio Saber, PA-C

## 2023-04-14 DIAGNOSIS — R8271 Bacteriuria: Secondary | ICD-10-CM | POA: Diagnosis not present

## 2023-04-16 DIAGNOSIS — K080 Exfoliation of teeth due to systemic causes: Secondary | ICD-10-CM | POA: Diagnosis not present

## 2023-04-16 NOTE — Progress Notes (Signed)
 [address]       [place] [address]             [phone]           MARLEN MOLLICA [facility] Medical Record #[id number] Date of Birth: [birth year]  [name] Lurena POUR, MD Duanne Butler DASEN, MD  Chief Complaint:   follow up MR consult  History of Present Illness:     Pt known to me from earlier consult for symptomatic severe MR, TR and afib. Pt has seen Dr [name] and has decided against the mitral clip therapy and wishes to get it all done at once He does report to a recent chronic UTI that still needs treatment and also dental work which is follow up to his root canal done      Past Medical History:  Diagnosis Date   CVA (cerebral infarction) 07-14-09   Dysrhythmia    History of kidney stones    Hyperlipidemia    Hypertension    Nephrolithiasis    Paroxysmal atrial fibrillation (HCC)    Paroxysmal atrial flutter (HCC)    a. s/p ablation 2012.   Prostate cancer [facility])    Stroke [facility])     Past Surgical History:  Procedure Laterality Date   APPENDECTOMY     BACK SURGERY     CYSTOSCOPY/URETEROSCOPY/HOLMIUM LASER/STENT PLACEMENT Left 09/19/2020   Procedure: CYSTOSCOPY RETROGRADE PYELOGRAM URETEROSCOPY/HOLMIUM LASER/STENT PLACEMENT;  Surgeon: Watt Rush, MD;  Location: [facility];  Service: Urology;  Laterality: Left;   EYE SURGERY     PROSTATECTOMY  2005   RIGHT/LEFT HEART CATH AND CORONARY ANGIOGRAPHY N/A 02/27/2023   Procedure: RIGHT/LEFT HEART CATH AND CORONARY ANGIOGRAPHY;  Surgeon: [name] Lurena POUR, MD;  [facility];  Service: Cardiovascular;  Laterality: N/A;   TEE WITHOUT CARDIOVERSION N/A 02/07/2023   Procedure: TRANSESOPHAGEAL ECHOCARDIOGRAM;  Surgeon: Francyne Headland, MD;  Location: [facility] LAB;  Service: Cardiovascular;  Laterality: N/A;    Social History   Tobacco Use  Smoking Status Never  Smokeless Tobacco Never    Social History   Substance and Sexual Activity  Alcohol Use No    Social History    Socioeconomic History   Marital status: Married    Spouse name: Not on file   Number of children: 2   Years of education: Not on file   Highest education level: Not on file  Occupational History   Occupation: Personnel Officer: [employer]  Tobacco Use   Smoking status: Never   Smokeless tobacco: Never  Vaping Use   Vaping status: Never Used  Substance and Sexual Activity   Alcohol use: No   Drug use: No   Sexual activity: Not on file  Other Topics Concern   Not on file  Social History Narrative   Not on file   Social Drivers of Health   Financial Resource Strain: Low Risk  (10/31/2022)   Overall Financial Resource Strain (CARDIA)    Difficulty of Paying Living Expenses: Not hard at all  Food Insecurity: No Food Insecurity (10/31/2022)   Hunger Vital Sign    Worried About Running Out of Food in the Last Year: Never true    Ran Out of Food in the Last Year: Never true  Transportation Needs: No Transportation Needs (10/31/2022)   PRAPARE - Administrator, Civil Service (Medical): No    Lack of Transportation (Non-Medical): No  Physical Activity: Insufficiently Active (  10/31/2022)   Exercise Vital Sign    Days of Exercise per Week: 3 days    Minutes of Exercise per Session: 20 min  Stress: No Stress Concern Present (10/31/2022)   [employer] of Occupational Health - Occupational Stress Questionnaire    Feeling of Stress : Not at all  Social Connections: Socially Integrated (10/31/2022)   Social Connection and Isolation Panel [NHANES]    Frequency of Communication with Friends and Family: More than three times a week    Frequency of Social Gatherings with Friends and Family: Three times a week    Attends Religious Services: More than 4 times per year    Active Member of Clubs or Organizations: Yes    Attends Banker Meetings: 1 to 4 times per year    Marital Status: Married  Catering Manager Violence: Not At Risk (10/31/2022)    Humiliation, Afraid, Rape, and Kick questionnaire    Fear of Current or Ex-Partner: No    Emotionally Abused: No    Physically Abused: No    Sexually Abused: No    No Known Allergies  Current Outpatient Medications  Medication Sig Dispense Refill   atorvastatin  (LIPITOR) 20 MG tablet TAKE 1 TABLET(20 MG) BY MOUTH DAILY (Patient taking differently: Take 20 mg by mouth every evening.) 90 tablet 3   furosemide  (LASIX ) 20 MG tablet Take 1 tablet (20 mg total) by mouth daily. 30 tablet 11   losartan  (COZAAR ) 25 MG tablet Take 1 tablet (25 mg total) by mouth daily. (Patient taking differently: Take 25 mg by mouth every evening.) 90 tablet 3   metoprolol  succinate (TOPROL -XL) 50 MG 24 hr tablet Take 1 tablet (50 mg total) by mouth at bedtime. Take with or immediately following a meal. (Patient taking differently: Take 75 mg by mouth at bedtime. Take with or immediately following a meal.) 90 tablet 3   rivaroxaban  (XARELTO ) 20 MG TABS tablet TAKE 1 TABLET BY MOUTH ONCE DAILY WITH SUPPER 90 tablet 1   traMADol  (ULTRAM ) 50 MG tablet TAKE 1 TABLET BY MOUTH EVERY 6 HOURS AS NEEDED. 120 tablet 0   No current facility-administered medications for this visit.     Family History  Problem Relation Age of Onset   Heart disease Mother 68       died   Prostate cancer Father        alive   Testicular cancer Brother        died       Physical Exam:      Diagnostic Studies & Laboratory data: I have personally reviewed the following studies and agree with the findings     Recent Radiology Findings:       Recent Lab Findings: Lab Results  Component Value Date   WBC 5.9 04/07/2023   HGB 14.9 04/07/2023   HCT 45.5 04/07/2023   PLT 190 04/07/2023   GLUCOSE 140 (H) 04/07/2023   CHOL 111 12/06/2022   TRIG 98 12/06/2022   HDL 28 (L) 12/06/2022   LDLCALC 65 12/06/2022   ALT 18 04/07/2023   AST 17 04/07/2023   NA 141 04/07/2023   K 4.1 04/07/2023   CL 105 04/07/2023   CREATININE 1.06  04/07/2023   BUN 17 04/07/2023   CO2 25 04/07/2023   TSH 1.83 04/07/2023   INR 1.19 07/10/2009   HGBA1C (H) 07/10/2009    6.2 (NOTE) The ADA recommends the following therapeutic goal for glycemic control related to Hgb A1c measurement: Goal of therapy: <  6.5 Hgb A1c  Reference: American Diabetes Association: Clinical Practice Recommendations 2010, Diabetes Care, 2010, 33: (Suppl  1).      Assessment / Plan:     78 yo male with symptomatic severe MR/TR and long standing afib that wishes to proceed with surgery. We have discussed the risks, goals, and recovery from surgery and will schedule following resolution of his UTI and dental work.    I have spent 30 min in review of the records, viewing studies and in face to face with patient and in coordination of future care    Deward Kallman 04/16/2023 6:41 PM

## 2023-04-17 ENCOUNTER — Ambulatory Visit: Payer: Medicare Other | Admitting: Thoracic Surgery (Cardiothoracic Vascular Surgery)

## 2023-04-17 ENCOUNTER — Encounter: Payer: Self-pay | Admitting: Thoracic Surgery (Cardiothoracic Vascular Surgery)

## 2023-04-17 VITALS — BP 105/61 | HR 91 | Resp 20 | Wt 207.8 lb

## 2023-04-17 DIAGNOSIS — I340 Nonrheumatic mitral (valve) insufficiency: Secondary | ICD-10-CM | POA: Diagnosis not present

## 2023-04-17 NOTE — Patient Instructions (Signed)
 Call when able to schedule your surgery following UTI and dental work treatment

## 2023-04-18 ENCOUNTER — Other Ambulatory Visit: Payer: Self-pay | Admitting: Thoracic Surgery (Cardiothoracic Vascular Surgery)

## 2023-04-18 DIAGNOSIS — I340 Nonrheumatic mitral (valve) insufficiency: Secondary | ICD-10-CM

## 2023-04-23 ENCOUNTER — Ambulatory Visit
Admission: RE | Admit: 2023-04-23 | Discharge: 2023-04-23 | Discharge status: Home / Self Care | Payer: Medicare Other | Source: Ambulatory Visit | Attending: Thoracic Surgery (Cardiothoracic Vascular Surgery)

## 2023-04-23 DIAGNOSIS — I700 Atherosclerosis of aorta: Secondary | ICD-10-CM | POA: Diagnosis not present

## 2023-04-23 DIAGNOSIS — K8020 Calculus of gallbladder without cholecystitis without obstruction: Secondary | ICD-10-CM | POA: Diagnosis not present

## 2023-04-23 DIAGNOSIS — I10 Essential (primary) hypertension: Secondary | ICD-10-CM | POA: Diagnosis not present

## 2023-04-23 DIAGNOSIS — I4891 Unspecified atrial fibrillation: Secondary | ICD-10-CM | POA: Diagnosis not present

## 2023-04-23 DIAGNOSIS — I340 Nonrheumatic mitral (valve) insufficiency: Secondary | ICD-10-CM

## 2023-04-25 DIAGNOSIS — R8271 Bacteriuria: Secondary | ICD-10-CM | POA: Diagnosis not present

## 2023-04-30 ENCOUNTER — Encounter: Payer: Self-pay | Admitting: *Deleted

## 2023-04-30 ENCOUNTER — Other Ambulatory Visit: Payer: Self-pay | Admitting: *Deleted

## 2023-04-30 DIAGNOSIS — I340 Nonrheumatic mitral (valve) insufficiency: Secondary | ICD-10-CM

## 2023-04-30 DIAGNOSIS — I071 Rheumatic tricuspid insufficiency: Secondary | ICD-10-CM

## 2023-05-07 DIAGNOSIS — N200 Calculus of kidney: Secondary | ICD-10-CM | POA: Diagnosis not present

## 2023-05-07 DIAGNOSIS — N393 Stress incontinence (female) (male): Secondary | ICD-10-CM | POA: Diagnosis not present

## 2023-05-07 DIAGNOSIS — N390 Urinary tract infection, site not specified: Secondary | ICD-10-CM | POA: Diagnosis not present

## 2023-05-07 DIAGNOSIS — R3912 Poor urinary stream: Secondary | ICD-10-CM | POA: Diagnosis not present

## 2023-05-12 ENCOUNTER — Telehealth: Payer: Self-pay | Admitting: *Deleted

## 2023-05-12 NOTE — Telephone Encounter (Signed)
Patient made aware scheduled surgery will not be delayed d/t UTI. Advised patient to speak with Dr. Wilson Singer regarding treatment with suppression medications. Patient verbalized understanding.

## 2023-05-12 NOTE — Telephone Encounter (Signed)
-----   Message from Eugenio Hoes sent at 05/12/2023 12:46 PM EST ----- Regarding: RE: UTI No. Will operate under supression ----- Message ----- From: Ludwig Clarks, RN Sent: 05/12/2023  12:13 PM EST To: Eugenio Hoes, MD Subject: UTI                                            Dr. Leafy Ro,  [name] [name] is scheduled for MVRepair/replacement, [name] 2/18. He was seen by his Urologist Dr. Wilson Singer last week, 1/29. UA showed UTI. Per report it was sent for culture. Notes from encounter are on your desk. Dr. Wilson Singer will treat pending culture results and is also considering suppression for UTI. Patient is asking if surgery will now be delayed. Please advise.  Thanks, [employer]

## 2023-05-21 ENCOUNTER — Other Ambulatory Visit: Payer: Self-pay | Admitting: Family Medicine

## 2023-05-21 DIAGNOSIS — E7800 Pure hypercholesterolemia, unspecified: Secondary | ICD-10-CM

## 2023-05-21 NOTE — Telephone Encounter (Signed)
Last OV 12/06/22 Requested Prescriptions  Pending Prescriptions Disp Refills   atorvastatin (LIPITOR) 20 MG tablet [Pharmacy Med Name: Atorvastatin Calcium 20 MG Oral Tablet] 90 tablet 0    Sig: Take 1 tablet by mouth once daily     Cardiovascular:  Antilipid - Statins Failed - 05/21/2023 10:40 AM      Failed - Valid encounter within last 12 months    Recent Outpatient Visits           1 year ago Atrial fibrillation, unspecified type (HCC)   [facility] [name], [name], MD   2 years ago Right sided sciatica   [facility] [name], MD   2 years ago Atrial fibrillation, unspecified type [facility])   [facility] [name], [name], MD   4 years ago [place] vision, bilateral   [facility] [name], [name], MD   4 years ago Routine general medical examination at a health care facility   [facility] [name], [name], MD       Future Appointments             In 4 months [name], MD [facility]            Failed - Lipid Panel in normal range within the last 12 months    Cholesterol, Total  Date Value Ref Range Status  03/02/2018 113 100 - 199 mg/dL Final   Cholesterol  Date Value Ref Range Status  12/06/2022 111 <200 mg/dL Final   LDL Cholesterol (Calc)  Date Value Ref Range Status  12/06/2022 65 mg/dL (calc) Final    Comment:    Reference range: <100 . Desirable range <100 mg/dL for primary prevention;   <70 mg/dL for patients with CHD or diabetic patients  with > or = 2 CHD risk factors. Marland Kitchen LDL-C is now calculated using the Martin-Hopkins  calculation, which is a validated novel method providing  better accuracy than the Friedewald equation in the  estimation of LDL-C.  Horald Pollen et al. Lenox Ahr. 5409;811(91): 2061-2068  (http://education.[facility].com/faq/FAQ164)    HDL  Date Value Ref Range Status  12/06/2022 28 (L) >  OR = 40 mg/dL Final  47/82/9562 28 (L) >39 mg/dL Final   Triglycerides  Date Value Ref Range Status  12/06/2022 98 <150 mg/dL Final         Passed - Patient is not pregnant

## 2023-05-22 NOTE — Pre-Procedure Instructions (Signed)
Surgical Instructions   Your procedure is scheduled on May 27, 2023. Report to [facility] Main Entrance "A" at 5:30 A.M., then check in with the Admitting office. Any questions or running late day of surgery: call [phone]  Questions prior to your surgery date: call [phone], Monday-Friday, 8am-4pm. If you experience any cold or flu symptoms such as cough, fever, chills, shortness of breath, etc. between now and your scheduled surgery, please notify us at the above number.     Remember:  Do not eat or drink after midnight the night before your surgery   Take these medicines the morning of surgery with A SIP OF WATER: atorvastatin (LIPITOR)  amoxicillin-clavulanate (AUGMENTIN)    May take these medicines IF NEEDED: traMADol (ULTRAM)    STOP taking your rivaroxaban (XARELTO) five days prior to surgery. Your last dose will be February 12th.   One week prior to surgery, STOP taking any Aspirin (unless otherwise instructed by your surgeon) Aleve, Naproxen, Ibuprofen, Motrin, Advil, Goody's, BC's, all herbal medications, fish oil, and non-prescription vitamins.                     Do NOT Smoke (Tobacco/Vaping) for 24 hours prior to your procedure.  If you use a CPAP at night, you may bring your mask/headgear for your overnight stay.   You will be asked to remove any contacts, glasses, piercing's, hearing aid's, dentures/partials prior to surgery. Please bring cases for these items if needed.    Patients discharged the day of surgery will not be allowed to drive home, and someone needs to stay with them for 24 hours.  SURGICAL WAITING ROOM VISITATION Patients may have no more than 2 support people in the waiting area - these visitors may rotate.   Pre-op nurse will coordinate an appropriate time for 1 ADULT support person, who may not rotate, to accompany patient in pre-op.  Children under the age of 28 must have an adult with them who is not the patient and must remain  in the main waiting area with an adult.  If the patient needs to stay at the hospital during part of their recovery, the visitor guidelines for inpatient rooms apply.  Please refer to the [facility] website for the visitor guidelines for any additional information.   If you received a COVID test during your pre-op visit  it is requested that you wear a mask when out in public, stay away from anyone that may not be feeling well and notify your surgeon if you develop symptoms. If you have been in contact with anyone that has tested positive in the last 10 days please notify you surgeon.      Pre-operative CHG Bathing Instructions   You can play a key role in reducing the risk of infection after surgery. Your skin needs to be as free of germs as possible. You can reduce the number of germs on your skin by washing with CHG (chlorhexidine gluconate) soap before surgery. CHG is an antiseptic soap that kills germs and continues to kill germs even after washing.   DO NOT use if you have an allergy to chlorhexidine/CHG or antibacterial soaps. If your skin becomes reddened or irritated, stop using the CHG and notify one of our RNs at [phone].              TAKE A SHOWER THE NIGHT BEFORE SURGERY AND THE DAY OF SURGERY    Please keep in mind the following:  DO NOT  shave, including legs and underarms, 48 hours prior to surgery.   You may shave your face before/day of surgery.  Place clean sheets on your bed the night before surgery Use a clean washcloth (not used since being washed) for each shower. DO NOT sleep with pet's night before surgery.  CHG Shower Instructions:  Wash your face and private area with normal soap. If you choose to wash your hair, wash first with your normal shampoo.  After you use shampoo/soap, rinse your hair and body thoroughly to remove shampoo/soap residue.  Turn the water OFF and apply half the bottle of CHG soap to a CLEAN washcloth.  Apply CHG soap ONLY FROM YOUR  NECK DOWN TO YOUR TOES (washing for 3-5 minutes)  DO NOT use CHG soap on face, private areas, open wounds, or sores.  Pay special attention to the area where your surgery is being performed.  If you are having back surgery, having someone wash your back for you may be helpful. Wait 2 minutes after CHG soap is applied, then you may rinse off the CHG soap.  Pat dry with a clean towel  Put on clean pajamas    Additional instructions for the day of surgery: DO NOT APPLY any lotions, deodorants, cologne, or perfumes.   Do not wear jewelry or makeup Do not wear nail polish, gel polish, artificial nails, or any other type of covering on natural nails (fingers and toes) Do not bring valuables to the hospital. [facility] is not responsible for valuables/personal belongings. Put on clean/comfortable clothes.  Please brush your teeth.  Ask your nurse before applying any prescription medications to the skin.

## 2023-05-23 ENCOUNTER — Other Ambulatory Visit (HOSPITAL_COMMUNITY): Payer: Medicare Other

## 2023-05-23 ENCOUNTER — Other Ambulatory Visit: Payer: Self-pay

## 2023-05-23 ENCOUNTER — Encounter (HOSPITAL_COMMUNITY): Payer: Self-pay

## 2023-05-23 ENCOUNTER — Ambulatory Visit (HOSPITAL_COMMUNITY)
Admission: RE | Admit: 2023-05-23 | Discharge: 2023-05-23 | Disposition: A | Discharge status: Home / Self Care | Payer: Medicare Other | Source: Ambulatory Visit | Attending: Thoracic Surgery (Cardiothoracic Vascular Surgery) | Admitting: Thoracic Surgery (Cardiothoracic Vascular Surgery)

## 2023-05-23 ENCOUNTER — Encounter (HOSPITAL_COMMUNITY)
Admission: RE | Admit: 2023-05-23 | Discharge: 2023-05-23 | Disposition: A | Discharge status: Home / Self Care | Payer: Medicare Other | Source: Ambulatory Visit | Attending: Thoracic Surgery (Cardiothoracic Vascular Surgery) | Admitting: Thoracic Surgery (Cardiothoracic Vascular Surgery)

## 2023-05-23 VITALS — BP 122/82 | HR 95 | Temp 98.1°F | Resp 17 | Ht 70.0 in | Wt 210.5 lb

## 2023-05-23 DIAGNOSIS — I340 Nonrheumatic mitral (valve) insufficiency: Secondary | ICD-10-CM

## 2023-05-23 DIAGNOSIS — Z01818 Encounter for other preprocedural examination: Secondary | ICD-10-CM

## 2023-05-23 DIAGNOSIS — I071 Rheumatic tricuspid insufficiency: Secondary | ICD-10-CM | POA: Diagnosis not present

## 2023-05-23 DIAGNOSIS — I10 Essential (primary) hypertension: Secondary | ICD-10-CM | POA: Insufficient documentation

## 2023-05-23 DIAGNOSIS — I4891 Unspecified atrial fibrillation: Secondary | ICD-10-CM | POA: Insufficient documentation

## 2023-05-23 DIAGNOSIS — I771 Stricture of artery: Secondary | ICD-10-CM | POA: Diagnosis not present

## 2023-05-23 HISTORY — DX: Unspecified osteoarthritis, unspecified site: M19.90

## 2023-05-23 HISTORY — DX: Cardiac murmur, unspecified: R01.1

## 2023-05-23 LAB — CBC
HCT: 45.3 % (ref 39.0–52.0)
Hemoglobin: 14.8 g/dL (ref 13.0–17.0)
MCH: 30.6 pg (ref 26.0–34.0)
MCHC: 32.7 g/dL (ref 30.0–36.0)
MCV: 93.8 fL (ref 80.0–100.0)
Platelets: 215 10*3/uL (ref 150–400)
RBC: 4.83 MIL/uL (ref 4.22–5.81)
RDW: 13.2 % (ref 11.5–15.5)
WBC: 6.3 10*3/uL (ref 4.0–10.5)
nRBC: 0 % (ref 0.0–0.2)

## 2023-05-23 LAB — PROTIME-INR
INR: 1.2 (ref 0.8–1.2)
Prothrombin Time: 15.6 s — ABNORMAL HIGH (ref 11.4–15.2)

## 2023-05-23 LAB — SURGICAL PCR SCREEN
MRSA, PCR: NEGATIVE
Staphylococcus aureus: NEGATIVE

## 2023-05-23 LAB — HEMOGLOBIN A1C
Hgb A1c MFr Bld: 5.8 % — ABNORMAL HIGH (ref 4.8–5.6)
Mean Plasma Glucose: 119.76 mg/dL

## 2023-05-23 LAB — COMPREHENSIVE METABOLIC PANEL
ALT: 20 U/L (ref 0–44)
AST: 23 U/L (ref 15–41)
Albumin: 3.7 g/dL (ref 3.5–5.0)
Alkaline Phosphatase: 53 U/L (ref 38–126)
Anion gap: 9 (ref 5–15)
BUN: 16 mg/dL (ref 8–23)
CO2: 25 mmol/L (ref 22–32)
Calcium: 9.2 mg/dL (ref 8.9–10.3)
Chloride: 108 mmol/L (ref 98–111)
Creatinine, Ser: 1.03 mg/dL (ref 0.61–1.24)
GFR, Estimated: >60 mL/min (ref >60)
Glucose, Bld: 84 mg/dL (ref 70–99)
Potassium: 4 mmol/L (ref 3.5–5.1)
Sodium: 142 mmol/L (ref 135–145)
Total Bilirubin: 1.4 mg/dL — ABNORMAL HIGH (ref 0.0–1.2)
Total Protein: 6.7 g/dL (ref 6.5–8.1)

## 2023-05-23 LAB — URINALYSIS, ROUTINE W REFLEX MICROSCOPIC
Bacteria, UA: NONE SEEN
Bilirubin Urine: NEGATIVE
Glucose, UA: NEGATIVE mg/dL
Ketones, ur: NEGATIVE mg/dL
Nitrite: NEGATIVE
Protein, ur: NEGATIVE mg/dL
RBC / HPF: >50 RBC/hpf (ref 0–5)
Specific Gravity, Urine: 1.02 (ref 1.005–1.030)
pH: 5 (ref 5.0–8.0)

## 2023-05-23 LAB — APTT: aPTT: 31 s (ref 24–36)

## 2023-05-23 NOTE — Progress Notes (Signed)
Darius Bump, RN with [facility] made aware of trace leukocytes in UA result. She was also made aware that pt is still on antibiotics for a UTI.

## 2023-05-23 NOTE — Progress Notes (Signed)
PCP - Dr. Lynnea Ferrier Cardiologist - Dr. Alverda Skeans - Last office visit 04/11/2023 Electrophysiologist - Dr. Lewayne Bunting - Last office visit 08/16/2022  PPM/ICD - Denies Device Orders - n/a Rep Notified - n/a  Chest x-ray - 05/23/2023 EKG - 05/23/2023 Stress Test - 08/24/2009 ECHO - 02/14/2023 Cardiac Cath - 02/27/2023  Sleep Study - Denies CPAP - n/a  No DM  Last dose of GLP1 agonist- n/a GLP1 instructions: n/a  Blood Thinner Instructions: Pt instructed to hold Xarelto 5 days prior to surgery. Last dose was 2/12 Aspirin Instructions: n/a  NPO after midnight  COVID TEST- n/a   Anesthesia review: Yes. Cardiac hx, on Xarelto for A.Fib, TIA and abnormal EKG review. Pt also notes recent urinary infection for which he completed antibiotics, but follow-up UA results were inconclusive. He was then started on a second round of antibiotics which he is currently taking (Augmentin) and will be taking through DOS. UA was already ordered per surgeon and results are pending  Patient denies shortness of breath, fever, cough and chest pain at PAT appointment. Pt denies any respiratory illness/infection in the last two months.   All instructions explained to the patient, with a verbal understanding of the material. Patient agrees to go over the instructions while at home for a better understanding. Patient also instructed to self quarantine after being tested for COVID-19. The opportunity to ask questions was provided.

## 2023-05-23 NOTE — Progress Notes (Signed)
Carotid artery duplex has been completed. Preliminary results can be found in CV Proc through chart review.   05/23/23 10:06 AM Olen Cordial RVT

## 2023-05-26 MED ORDER — EPINEPHRINE HCL 5 MG/250ML IV SOLN IN NS
0.0000 ug/min | INTRAVENOUS | Status: AC
  Administered 2023-05-27: 2 ug/min via INTRAVENOUS
  Filled 2023-05-26: qty 250

## 2023-05-26 MED ORDER — TRANEXAMIC ACID (OHS) BOLUS VIA INFUSION
15.0000 mg/kg | INTRAVENOUS | Status: AC
  Administered 2023-05-27: 1432.5 mg via INTRAVENOUS
  Filled 2023-05-26: qty 1433

## 2023-05-26 MED ORDER — CEFAZOLIN SODIUM-DEXTROSE 2-4 GM/100ML-% IV SOLN
2.0000 g | INTRAVENOUS | Status: AC
  Administered 2023-05-27: 2 g via INTRAVENOUS
  Filled 2023-05-26: qty 100

## 2023-05-26 MED ORDER — TRANEXAMIC ACID (OHS) PUMP PRIME SOLUTION
2.0000 mg/kg | INTRAVENOUS | Status: DC
  Filled 2023-05-26: qty 1.91

## 2023-05-26 MED ORDER — MANNITOL 20 % IV SOLN
INTRAVENOUS | Status: DC
  Filled 2023-05-26: qty 13

## 2023-05-26 MED ORDER — NITROGLYCERIN IN D5W 200-5 MCG/ML-% IV SOLN
2.0000 ug/min | INTRAVENOUS | Status: DC
  Filled 2023-05-26: qty 250

## 2023-05-26 MED ORDER — TRANEXAMIC ACID 1000 MG/10ML IV SOLN
1.5000 mg/kg/h | INTRAVENOUS | Status: AC
  Administered 2023-05-27: 1.5 mg/kg/h via INTRAVENOUS
  Filled 2023-05-26: qty 25

## 2023-05-26 MED ORDER — PLASMA-LYTE A IV SOLN
INTRAVENOUS | Status: DC
  Filled 2023-05-26: qty 2.5

## 2023-05-26 MED ORDER — INSULIN REGULAR(HUMAN) IN NACL 100-0.9 UT/100ML-% IV SOLN
INTRAVENOUS | Status: AC
  Administered 2023-05-27: .9 [IU]/h via INTRAVENOUS
  Filled 2023-05-26: qty 100

## 2023-05-26 MED ORDER — POTASSIUM CHLORIDE 2 MEQ/ML IV SOLN
80.0000 meq | INTRAVENOUS | Status: DC
  Filled 2023-05-26: qty 40

## 2023-05-26 MED ORDER — VANCOMYCIN HCL 1000 MG IV SOLR
INTRAVENOUS | Status: AC
  Administered 2023-05-27: 1.5 mL
  Filled 2023-05-26: qty 20

## 2023-05-26 MED ORDER — DEXMEDETOMIDINE HCL IN NACL 400 MCG/100ML IV SOLN
0.1000 ug/kg/h | INTRAVENOUS | Status: AC
  Administered 2023-05-27: .4 ug/kg/h via INTRAVENOUS
  Filled 2023-05-26: qty 100

## 2023-05-26 MED ORDER — PHENYLEPHRINE HCL-NACL 20-0.9 MG/250ML-% IV SOLN
30.0000 ug/min | INTRAVENOUS | Status: AC
  Administered 2023-05-27: 30 ug/min via INTRAVENOUS
  Filled 2023-05-26: qty 250

## 2023-05-26 MED ORDER — CEFAZOLIN SODIUM-DEXTROSE 2-4 GM/100ML-% IV SOLN
2.0000 g | INTRAVENOUS | Status: DC
  Filled 2023-05-26 (×2): qty 100

## 2023-05-26 MED ORDER — HEPARIN 30,000 UNITS/1000 ML (OHS) CELLSAVER SOLUTION
Status: DC
  Filled 2023-05-26: qty 1000

## 2023-05-26 MED ORDER — NOREPINEPHRINE 4 MG/250ML-% IV SOLN
0.0000 ug/min | INTRAVENOUS | Status: DC
  Filled 2023-05-26: qty 250

## 2023-05-26 MED ORDER — VANCOMYCIN HCL 1.5 G IV SOLR
1500.0000 mg | INTRAVENOUS | Status: DC
  Filled 2023-05-26: qty 30

## 2023-05-26 MED ORDER — MILRINONE LACTATE IN DEXTROSE 20-5 MG/100ML-% IV SOLN
0.3000 ug/kg/min | INTRAVENOUS | Status: DC
  Filled 2023-05-26: qty 100

## 2023-05-26 NOTE — H&P (Signed)
[address]       [place] [address]             [phone]                                   [name] [name] [facility] Medical Record #[id number] Date of Birth: 1945/12/01   [name], MD [name], MD   Chief Complaint:   follow up MR consult   History of Present Illness:     Pt known to me from earlier consult for symptomatic severe MR, TR and afib. Pt has seen Dr [name] and has decided against the mitral clip therapy and wishes "to get it all done at once" He does report to a recent chronic UTI that still needs treatment and also dental work which is follow up to his root canal done             Past Medical History:  Diagnosis Date   CVA (cerebral infarction) 07-14-09   Dysrhythmia     History of kidney stones     Hyperlipidemia     Hypertension     Nephrolithiasis     Paroxysmal atrial fibrillation (HCC)     Paroxysmal atrial flutter (HCC)      a. s/p ablation 2012.   Prostate cancer [facility])     Stroke [facility])                 Past Surgical History:  Procedure Laterality Date   APPENDECTOMY       BACK SURGERY       CYSTOSCOPY/URETEROSCOPY/HOLMIUM LASER/STENT PLACEMENT Left 09/19/2020    Procedure: CYSTOSCOPY RETROGRADE PYELOGRAM URETEROSCOPY/HOLMIUM LASER/STENT PLACEMENT;  Surgeon: Bjorn Pippin, MD;  Location: [facility] ORS;  Service: Urology;  Laterality: Left;   EYE SURGERY       PROSTATECTOMY   2005   RIGHT/LEFT HEART CATH AND CORONARY ANGIOGRAPHY N/A 02/27/2023    Procedure: RIGHT/LEFT HEART CATH AND CORONARY ANGIOGRAPHY;  Surgeon: [name], MD;  Location: [facility] INVASIVE CV LAB;  Service: Cardiovascular;  Laterality: N/A;   TEE WITHOUT CARDIOVERSION N/A 02/07/2023    Procedure: TRANSESOPHAGEAL ECHOCARDIOGRAM;  Surgeon: Thurmon Fair, MD;  Location: [facility] INVASIVE CV LAB;  Service: Cardiovascular;  Laterality: N/A;          Tobacco Use History  Social History       Tobacco Use  Smoking Status Never  Smokeless  Tobacco Never      Social History       Substance and Sexual Activity  Alcohol Use No      Social History         Socioeconomic History   Marital status: Married      Spouse name: Not on file   Number of children: 2   Years of education: Not on file   Highest education level: Not on file  Occupational History   Occupation: Patent attorney: [employer]  Tobacco Use   Smoking status: Never   Smokeless tobacco: Never  Vaping Use   Vaping status: Never Used  Substance and Sexual Activity   Alcohol use: No   Drug use: No   Sexual activity: Not on file  Other Topics Concern   Not on file  Social History Narrative   Not on file    Social Drivers of Health  Financial Resource Strain: Low Risk  (10/31/2022)    Overall Financial Resource Strain (CARDIA)     Difficulty of Paying Living Expenses: Not hard at all  Food Insecurity: No Food Insecurity (10/31/2022)    Hunger Vital Sign     Worried About Running Out of Food in the Last Year: Never true     Ran Out of Food in the Last Year: Never true  Transportation Needs: No Transportation Needs (10/31/2022)    PRAPARE - Therapist, art (Medical): No     Lack of Transportation (Non-Medical): No  Physical Activity: Insufficiently Active (10/31/2022)    Exercise Vital Sign     Days of Exercise per Week: 3 days     Minutes of Exercise per Session: 20 min  Stress: No Stress Concern Present (10/31/2022)    [employer] of Occupational Health - Occupational Stress Questionnaire     Feeling of Stress : Not at all  Social Connections: Socially Integrated (10/31/2022)    Social Connection and Isolation Panel [NHANES]     Frequency of Communication with Friends and Family: More than three times a week     Frequency of Social Gatherings with Friends and Family: Three times a week     Attends Religious Services: More than 4 times per year     Active Member of Clubs or Organizations:  Yes     Attends Banker Meetings: 1 to 4 times per year     Marital Status: Married  Catering manager Violence: Not At Risk (10/31/2022)    Humiliation, Afraid, Rape, and Kick questionnaire     Fear of Current or Ex-Partner: No     Emotionally Abused: No     Physically Abused: No     Sexually Abused: No      Allergies  No Known Allergies           Current Outpatient Medications  Medication Sig Dispense Refill   atorvastatin (LIPITOR) 20 MG tablet TAKE 1 TABLET(20 MG) BY MOUTH DAILY (Patient taking differently: Take 20 mg by mouth every evening.) 90 tablet 3   furosemide (LASIX) 20 MG tablet Take 1 tablet (20 mg total) by mouth daily. 30 tablet 11   losartan (COZAAR) 25 MG tablet Take 1 tablet (25 mg total) by mouth daily. (Patient taking differently: Take 25 mg by mouth every evening.) 90 tablet 3   metoprolol succinate (TOPROL-XL) 50 MG 24 hr tablet Take 1 tablet (50 mg total) by mouth at bedtime. Take with or immediately following a meal. (Patient taking differently: Take 75 mg by mouth at bedtime. Take with or immediately following a meal.) 90 tablet 3   rivaroxaban (XARELTO) 20 MG TABS tablet TAKE 1 TABLET BY MOUTH ONCE DAILY WITH SUPPER 90 tablet 1   traMADol (ULTRAM) 50 MG tablet TAKE 1 TABLET BY MOUTH EVERY 6 HOURS AS NEEDED. 120 tablet 0      No current facility-administered medications for this visit.               Family History  Problem Relation Age of Onset   Heart disease Mother 70        died   Prostate cancer Father          alive   Testicular cancer Brother          died                Physical Exam:  Diagnostic Studies & Laboratory data: I have personally reviewed the following studies and agree with the findings     Recent Radiology Findings:        Recent Lab Findings: Recent Labs        Lab Results  Component Value Date    WBC 5.9 04/07/2023    HGB 14.9 04/07/2023    HCT 45.5 04/07/2023    PLT 190 04/07/2023     GLUCOSE 140 (H) 04/07/2023    CHOL 111 12/06/2022    TRIG 98 12/06/2022    HDL 28 (L) 12/06/2022    LDLCALC 65 12/06/2022    ALT 18 04/07/2023    AST 17 04/07/2023    NA 141 04/07/2023    K 4.1 04/07/2023    CL 105 04/07/2023    CREATININE 1.06 04/07/2023    BUN 17 04/07/2023    CO2 25 04/07/2023    TSH 1.83 04/07/2023    INR 1.19 07/10/2009    HGBA1C (H) 07/10/2009      6.2 (NOTE) The ADA recommends the following therapeutic goal for glycemic control related to Hgb A1c measurement: Goal of therapy: <6.5 Hgb A1c  Reference: American Diabetes Association: Clinical Practice Recommendations 2010, Diabetes Care, 2010, 33: (Suppl  1).            Assessment / Plan:     78 yo male with symptomatic severe MR/TR and long standing afib that wishes to proceed with surgery. We have discussed the risks, goals, and recovery from surgery and will schedule following resolution of his UTI and dental work.

## 2023-05-27 ENCOUNTER — Other Ambulatory Visit: Payer: Self-pay

## 2023-05-27 ENCOUNTER — Inpatient Hospital Stay (HOSPITAL_COMMUNITY): Payer: Medicare Other

## 2023-05-27 ENCOUNTER — Inpatient Hospital Stay (HOSPITAL_COMMUNITY)
Admission: RE | Disposition: A | Discharge status: Other Acute Inpatient Hospital | Payer: Self-pay | Source: Home / Self Care | Attending: Thoracic Surgery (Cardiothoracic Vascular Surgery)

## 2023-05-27 ENCOUNTER — Inpatient Hospital Stay (HOSPITAL_COMMUNITY): Payer: Medicare Other | Admitting: Physician Assistant

## 2023-05-27 ENCOUNTER — Inpatient Hospital Stay (HOSPITAL_COMMUNITY): Payer: Self-pay | Admitting: Physician Assistant

## 2023-05-27 ENCOUNTER — Encounter (HOSPITAL_COMMUNITY): Payer: Self-pay | Admitting: Thoracic Surgery (Cardiothoracic Vascular Surgery)

## 2023-05-27 ENCOUNTER — Inpatient Hospital Stay (HOSPITAL_COMMUNITY)
Admission: RE | Admit: 2023-05-27 | Discharge: 2023-06-18 | DRG: 219 | Disposition: A | Discharge status: Rehabilitation Facility | Payer: Medicare Other | Attending: Thoracic Surgery (Cardiothoracic Vascular Surgery) | Admitting: Thoracic Surgery (Cardiothoracic Vascular Surgery)

## 2023-05-27 DIAGNOSIS — I110 Hypertensive heart disease with heart failure: Secondary | ICD-10-CM | POA: Diagnosis present

## 2023-05-27 DIAGNOSIS — Z8249 Family history of ischemic heart disease and other diseases of the circulatory system: Secondary | ICD-10-CM | POA: Diagnosis not present

## 2023-05-27 DIAGNOSIS — I4891 Unspecified atrial fibrillation: Secondary | ICD-10-CM | POA: Diagnosis not present

## 2023-05-27 DIAGNOSIS — K648 Other hemorrhoids: Secondary | ICD-10-CM | POA: Diagnosis not present

## 2023-05-27 DIAGNOSIS — Z7901 Long term (current) use of anticoagulants: Secondary | ICD-10-CM

## 2023-05-27 DIAGNOSIS — R079 Chest pain, unspecified: Secondary | ICD-10-CM | POA: Diagnosis not present

## 2023-05-27 DIAGNOSIS — D72829 Elevated white blood cell count, unspecified: Secondary | ICD-10-CM | POA: Diagnosis not present

## 2023-05-27 DIAGNOSIS — R710 Precipitous drop in hematocrit: Secondary | ICD-10-CM | POA: Diagnosis not present

## 2023-05-27 DIAGNOSIS — Z950 Presence of cardiac pacemaker: Secondary | ICD-10-CM | POA: Diagnosis not present

## 2023-05-27 DIAGNOSIS — I4892 Unspecified atrial flutter: Secondary | ICD-10-CM | POA: Diagnosis not present

## 2023-05-27 DIAGNOSIS — Z9079 Acquired absence of other genital organ(s): Secondary | ICD-10-CM | POA: Diagnosis not present

## 2023-05-27 DIAGNOSIS — K922 Gastrointestinal hemorrhage, unspecified: Secondary | ICD-10-CM | POA: Diagnosis not present

## 2023-05-27 DIAGNOSIS — R578 Other shock: Secondary | ICD-10-CM | POA: Diagnosis not present

## 2023-05-27 DIAGNOSIS — E8722 Chronic metabolic acidosis: Secondary | ICD-10-CM | POA: Diagnosis not present

## 2023-05-27 DIAGNOSIS — K8020 Calculus of gallbladder without cholecystitis without obstruction: Secondary | ICD-10-CM | POA: Diagnosis not present

## 2023-05-27 DIAGNOSIS — R739 Hyperglycemia, unspecified: Secondary | ICD-10-CM | POA: Diagnosis not present

## 2023-05-27 DIAGNOSIS — Z961 Presence of intraocular lens: Secondary | ICD-10-CM | POA: Diagnosis not present

## 2023-05-27 DIAGNOSIS — I081 Rheumatic disorders of both mitral and tricuspid valves: Secondary | ICD-10-CM | POA: Diagnosis not present

## 2023-05-27 DIAGNOSIS — F4321 Adjustment disorder with depressed mood: Secondary | ICD-10-CM | POA: Diagnosis not present

## 2023-05-27 DIAGNOSIS — K5731 Diverticulosis of large intestine without perforation or abscess with bleeding: Secondary | ICD-10-CM | POA: Diagnosis not present

## 2023-05-27 DIAGNOSIS — J9811 Atelectasis: Secondary | ICD-10-CM | POA: Diagnosis present

## 2023-05-27 DIAGNOSIS — E785 Hyperlipidemia, unspecified: Secondary | ICD-10-CM | POA: Diagnosis not present

## 2023-05-27 DIAGNOSIS — K9429 Other complications of gastrostomy: Secondary | ICD-10-CM | POA: Diagnosis not present

## 2023-05-27 DIAGNOSIS — D62 Acute posthemorrhagic anemia: Secondary | ICD-10-CM

## 2023-05-27 DIAGNOSIS — J189 Pneumonia, unspecified organism: Secondary | ICD-10-CM | POA: Diagnosis not present

## 2023-05-27 DIAGNOSIS — J9690 Respiratory failure, unspecified, unspecified whether with hypoxia or hypercapnia: Secondary | ICD-10-CM | POA: Diagnosis not present

## 2023-05-27 DIAGNOSIS — D1779 Benign lipomatous neoplasm of other sites: Secondary | ICD-10-CM | POA: Diagnosis not present

## 2023-05-27 DIAGNOSIS — Z95810 Presence of automatic (implantable) cardiac defibrillator: Secondary | ICD-10-CM | POA: Diagnosis not present

## 2023-05-27 DIAGNOSIS — K559 Vascular disorder of intestine, unspecified: Secondary | ICD-10-CM | POA: Diagnosis present

## 2023-05-27 DIAGNOSIS — N390 Urinary tract infection, site not specified: Secondary | ICD-10-CM | POA: Diagnosis not present

## 2023-05-27 DIAGNOSIS — Z9841 Cataract extraction status, right eye: Secondary | ICD-10-CM | POA: Diagnosis not present

## 2023-05-27 DIAGNOSIS — K567 Ileus, unspecified: Secondary | ICD-10-CM | POA: Diagnosis not present

## 2023-05-27 DIAGNOSIS — T8119XA Other postprocedural shock, initial encounter: Secondary | ICD-10-CM | POA: Diagnosis not present

## 2023-05-27 DIAGNOSIS — Z87442 Personal history of urinary calculi: Secondary | ICD-10-CM

## 2023-05-27 DIAGNOSIS — R7303 Prediabetes: Secondary | ICD-10-CM | POA: Diagnosis not present

## 2023-05-27 DIAGNOSIS — Z452 Encounter for adjustment and management of vascular access device: Secondary | ICD-10-CM | POA: Diagnosis not present

## 2023-05-27 DIAGNOSIS — Z9889 Other specified postprocedural states: Principal | ICD-10-CM

## 2023-05-27 DIAGNOSIS — Z8679 Personal history of other diseases of the circulatory system: Secondary | ICD-10-CM

## 2023-05-27 DIAGNOSIS — J90 Pleural effusion, not elsewhere classified: Secondary | ICD-10-CM | POA: Diagnosis not present

## 2023-05-27 DIAGNOSIS — Z7982 Long term (current) use of aspirin: Secondary | ICD-10-CM | POA: Diagnosis not present

## 2023-05-27 DIAGNOSIS — E872 Acidosis, unspecified: Secondary | ICD-10-CM | POA: Diagnosis not present

## 2023-05-27 DIAGNOSIS — I10 Essential (primary) hypertension: Secondary | ICD-10-CM | POA: Diagnosis not present

## 2023-05-27 DIAGNOSIS — Z8673 Personal history of transient ischemic attack (TIA), and cerebral infarction without residual deficits: Secondary | ICD-10-CM | POA: Diagnosis not present

## 2023-05-27 DIAGNOSIS — Z860100 Personal history of colon polyps, unspecified: Secondary | ICD-10-CM

## 2023-05-27 DIAGNOSIS — D126 Benign neoplasm of colon, unspecified: Secondary | ICD-10-CM

## 2023-05-27 DIAGNOSIS — D120 Benign neoplasm of cecum: Secondary | ICD-10-CM | POA: Diagnosis not present

## 2023-05-27 DIAGNOSIS — I679 Cerebrovascular disease, unspecified: Secondary | ICD-10-CM

## 2023-05-27 DIAGNOSIS — Z860101 Personal history of adenomatous and serrated colon polyps: Secondary | ICD-10-CM | POA: Diagnosis not present

## 2023-05-27 DIAGNOSIS — K921 Melena: Secondary | ICD-10-CM

## 2023-05-27 DIAGNOSIS — Z8042 Family history of malignant neoplasm of prostate: Secondary | ICD-10-CM

## 2023-05-27 DIAGNOSIS — R0989 Other specified symptoms and signs involving the circulatory and respiratory systems: Secondary | ICD-10-CM | POA: Diagnosis not present

## 2023-05-27 DIAGNOSIS — I4819 Other persistent atrial fibrillation: Secondary | ICD-10-CM | POA: Diagnosis not present

## 2023-05-27 DIAGNOSIS — I3139 Other pericardial effusion (noninflammatory): Secondary | ICD-10-CM | POA: Diagnosis not present

## 2023-05-27 DIAGNOSIS — I480 Paroxysmal atrial fibrillation: Secondary | ICD-10-CM | POA: Diagnosis not present

## 2023-05-27 DIAGNOSIS — G8929 Other chronic pain: Secondary | ICD-10-CM | POA: Diagnosis present

## 2023-05-27 DIAGNOSIS — R001 Bradycardia, unspecified: Secondary | ICD-10-CM | POA: Diagnosis not present

## 2023-05-27 DIAGNOSIS — H409 Unspecified glaucoma: Secondary | ICD-10-CM | POA: Diagnosis not present

## 2023-05-27 DIAGNOSIS — N170 Acute kidney failure with tubular necrosis: Secondary | ICD-10-CM | POA: Diagnosis present

## 2023-05-27 DIAGNOSIS — E873 Alkalosis: Secondary | ICD-10-CM | POA: Diagnosis not present

## 2023-05-27 DIAGNOSIS — Z8719 Personal history of other diseases of the digestive system: Secondary | ICD-10-CM | POA: Diagnosis not present

## 2023-05-27 DIAGNOSIS — K625 Hemorrhage of anus and rectum: Secondary | ICD-10-CM | POA: Diagnosis not present

## 2023-05-27 DIAGNOSIS — I340 Nonrheumatic mitral (valve) insufficiency: Secondary | ICD-10-CM | POA: Diagnosis not present

## 2023-05-27 DIAGNOSIS — R109 Unspecified abdominal pain: Secondary | ICD-10-CM | POA: Diagnosis not present

## 2023-05-27 DIAGNOSIS — I495 Sick sinus syndrome: Secondary | ICD-10-CM | POA: Diagnosis not present

## 2023-05-27 DIAGNOSIS — K5730 Diverticulosis of large intestine without perforation or abscess without bleeding: Secondary | ICD-10-CM | POA: Diagnosis not present

## 2023-05-27 DIAGNOSIS — E871 Hypo-osmolality and hyponatremia: Secondary | ICD-10-CM | POA: Diagnosis present

## 2023-05-27 DIAGNOSIS — E876 Hypokalemia: Secondary | ICD-10-CM | POA: Diagnosis not present

## 2023-05-27 DIAGNOSIS — N289 Disorder of kidney and ureter, unspecified: Secondary | ICD-10-CM | POA: Diagnosis not present

## 2023-05-27 DIAGNOSIS — I959 Hypotension, unspecified: Secondary | ICD-10-CM

## 2023-05-27 DIAGNOSIS — N202 Calculus of kidney with calculus of ureter: Secondary | ICD-10-CM | POA: Diagnosis present

## 2023-05-27 DIAGNOSIS — I5021 Acute systolic (congestive) heart failure: Secondary | ICD-10-CM | POA: Diagnosis not present

## 2023-05-27 DIAGNOSIS — Z9842 Cataract extraction status, left eye: Secondary | ICD-10-CM | POA: Diagnosis not present

## 2023-05-27 DIAGNOSIS — Z4682 Encounter for fitting and adjustment of non-vascular catheter: Secondary | ICD-10-CM | POA: Diagnosis not present

## 2023-05-27 DIAGNOSIS — I517 Cardiomegaly: Secondary | ICD-10-CM | POA: Diagnosis not present

## 2023-05-27 DIAGNOSIS — K649 Unspecified hemorrhoids: Secondary | ICD-10-CM | POA: Diagnosis not present

## 2023-05-27 DIAGNOSIS — I482 Chronic atrial fibrillation, unspecified: Secondary | ICD-10-CM | POA: Diagnosis not present

## 2023-05-27 DIAGNOSIS — Y838 Other surgical procedures as the cause of abnormal reaction of the patient, or of later complication, without mention of misadventure at the time of the procedure: Secondary | ICD-10-CM | POA: Diagnosis not present

## 2023-05-27 DIAGNOSIS — I071 Rheumatic tricuspid insufficiency: Secondary | ICD-10-CM | POA: Diagnosis not present

## 2023-05-27 DIAGNOSIS — Z9049 Acquired absence of other specified parts of digestive tract: Secondary | ICD-10-CM

## 2023-05-27 DIAGNOSIS — R5381 Other malaise: Secondary | ICD-10-CM | POA: Diagnosis not present

## 2023-05-27 DIAGNOSIS — D123 Benign neoplasm of transverse colon: Secondary | ICD-10-CM | POA: Diagnosis not present

## 2023-05-27 DIAGNOSIS — K529 Noninfective gastroenteritis and colitis, unspecified: Secondary | ICD-10-CM | POA: Diagnosis not present

## 2023-05-27 DIAGNOSIS — R140 Abdominal distension (gaseous): Secondary | ICD-10-CM | POA: Diagnosis not present

## 2023-05-27 DIAGNOSIS — I361 Nonrheumatic tricuspid (valve) insufficiency: Secondary | ICD-10-CM | POA: Diagnosis not present

## 2023-05-27 DIAGNOSIS — Z8546 Personal history of malignant neoplasm of prostate: Secondary | ICD-10-CM | POA: Diagnosis not present

## 2023-05-27 DIAGNOSIS — Z8043 Family history of malignant neoplasm of testis: Secondary | ICD-10-CM | POA: Diagnosis not present

## 2023-05-27 DIAGNOSIS — R32 Unspecified urinary incontinence: Secondary | ICD-10-CM | POA: Diagnosis not present

## 2023-05-27 DIAGNOSIS — D124 Benign neoplasm of descending colon: Secondary | ICD-10-CM | POA: Diagnosis not present

## 2023-05-27 DIAGNOSIS — R571 Hypovolemic shock: Secondary | ICD-10-CM | POA: Diagnosis not present

## 2023-05-27 DIAGNOSIS — Z79899 Other long term (current) drug therapy: Secondary | ICD-10-CM

## 2023-05-27 DIAGNOSIS — N179 Acute kidney failure, unspecified: Secondary | ICD-10-CM | POA: Diagnosis not present

## 2023-05-27 DIAGNOSIS — Z95828 Presence of other vascular implants and grafts: Secondary | ICD-10-CM | POA: Diagnosis not present

## 2023-05-27 DIAGNOSIS — I639 Cerebral infarction, unspecified: Secondary | ICD-10-CM | POA: Diagnosis not present

## 2023-05-27 DIAGNOSIS — R918 Other nonspecific abnormal finding of lung field: Secondary | ICD-10-CM | POA: Diagnosis not present

## 2023-05-27 DIAGNOSIS — K6389 Other specified diseases of intestine: Secondary | ICD-10-CM | POA: Diagnosis not present

## 2023-05-27 HISTORY — PX: MAZE: SHX5063

## 2023-05-27 HISTORY — PX: MITRAL VALVE REPAIR: SHX2039

## 2023-05-27 HISTORY — PX: TEE WITHOUT CARDIOVERSION: SHX5443

## 2023-05-27 HISTORY — PX: TRICUSPID VALVE REPLACEMENT: SHX816

## 2023-05-27 LAB — POCT I-STAT 7, (LYTES, BLD GAS, ICA,H+H)
Acid-Base Excess: 0 mmol/L (ref 0.0–2.0)
Acid-base deficit: 3 mmol/L — ABNORMAL HIGH (ref 0.0–2.0)
Acid-base deficit: 3 mmol/L — ABNORMAL HIGH (ref 0.0–2.0)
Acid-base deficit: 6 mmol/L — ABNORMAL HIGH (ref 0.0–2.0)
Acid-base deficit: 9 mmol/L — ABNORMAL HIGH (ref 0.0–2.0)
Acid-base deficit: 5 mmol/L — ABNORMAL HIGH (ref 0.0–2.0)
Bicarbonate: 23.7 mmol/L (ref 20.0–28.0)
Bicarbonate: 24.1 mmol/L (ref 20.0–28.0)
Bicarbonate: 22 mmol/L (ref 20.0–28.0)
Bicarbonate: 20.1 mmol/L (ref 20.0–28.0)
Bicarbonate: 17.3 mmol/L — ABNORMAL LOW (ref 20.0–28.0)
Bicarbonate: 21.3 mmol/L (ref 20.0–28.0)
Calcium, Ion: 1.23 mmol/L (ref 1.15–1.40)
Calcium, Ion: 0.99 mmol/L — ABNORMAL LOW (ref 1.15–1.40)
Calcium, Ion: 1.23 mmol/L (ref 1.15–1.40)
Calcium, Ion: 1.14 mmol/L — ABNORMAL LOW (ref 1.15–1.40)
Calcium, Ion: 1.13 mmol/L — ABNORMAL LOW (ref 1.15–1.40)
Calcium, Ion: 1.03 mmol/L — ABNORMAL LOW (ref 1.15–1.40)
HCT: 40 % (ref 39.0–52.0)
HCT: 30 % — ABNORMAL LOW (ref 39.0–52.0)
HCT: 31 % — ABNORMAL LOW (ref 39.0–52.0)
HCT: 36 % — ABNORMAL LOW (ref 39.0–52.0)
HCT: 34 % — ABNORMAL LOW (ref 39.0–52.0)
HCT: 33 % — ABNORMAL LOW (ref 39.0–52.0)
Hemoglobin: 13.6 g/dL (ref 13.0–17.0)
Hemoglobin: 10.2 g/dL — ABNORMAL LOW (ref 13.0–17.0)
Hemoglobin: 10.5 g/dL — ABNORMAL LOW (ref 13.0–17.0)
Hemoglobin: 12.2 g/dL — ABNORMAL LOW (ref 13.0–17.0)
Hemoglobin: 11.6 g/dL — ABNORMAL LOW (ref 13.0–17.0)
Hemoglobin: 11.2 g/dL — ABNORMAL LOW (ref 13.0–17.0)
O2 Saturation: 100 %
O2 Saturation: 100 %
O2 Saturation: 99 %
O2 Saturation: 94 %
O2 Saturation: 98 %
O2 Saturation: 90 %
Patient temperature: 36.5
Patient temperature: 36.3
Patient temperature: 36.6
Potassium: 3.7 mmol/L (ref 3.5–5.1)
Potassium: 4.1 mmol/L (ref 3.5–5.1)
Potassium: 4.2 mmol/L (ref 3.5–5.1)
Potassium: 3.8 mmol/L (ref 3.5–5.1)
Potassium: 3.8 mmol/L (ref 3.5–5.1)
Potassium: 6.2 mmol/L — ABNORMAL HIGH (ref 3.5–5.1)
Sodium: 142 mmol/L (ref 135–145)
Sodium: 142 mmol/L (ref 135–145)
Sodium: 138 mmol/L (ref 135–145)
Sodium: 141 mmol/L (ref 135–145)
Sodium: 143 mmol/L (ref 135–145)
Sodium: 141 mmol/L (ref 135–145)
TCO2: 25 mmol/L (ref 22–32)
TCO2: 25 mmol/L (ref 22–32)
TCO2: 23 mmol/L (ref 22–32)
TCO2: 21 mmol/L — ABNORMAL LOW (ref 22–32)
TCO2: 18 mmol/L — ABNORMAL LOW (ref 22–32)
TCO2: 23 mmol/L (ref 22–32)
pCO2 arterial: 45.6 mm[Hg] (ref 32–48)
pCO2 arterial: 38.3 mm[Hg] (ref 32–48)
pCO2 arterial: 37.6 mm[Hg] (ref 32–48)
pCO2 arterial: 40.9 mm[Hg] (ref 32–48)
pCO2 arterial: 38 mm[Hg] (ref 32–48)
pCO2 arterial: 41.6 mm[Hg] (ref 32–48)
pH, Arterial: 7.324 — ABNORMAL LOW (ref 7.35–7.45)
pH, Arterial: 7.407 (ref 7.35–7.45)
pH, Arterial: 7.375 (ref 7.35–7.45)
pH, Arterial: 7.296 — ABNORMAL LOW (ref 7.35–7.45)
pH, Arterial: 7.263 — ABNORMAL LOW (ref 7.35–7.45)
pH, Arterial: 7.314 — ABNORMAL LOW (ref 7.35–7.45)
pO2, Arterial: 286 mm[Hg] — ABNORMAL HIGH (ref 83–108)
pO2, Arterial: 432 mm[Hg] — ABNORMAL HIGH (ref 83–108)
pO2, Arterial: 118 mm[Hg] — ABNORMAL HIGH (ref 83–108)
pO2, Arterial: 75 mm[Hg] — ABNORMAL LOW (ref 83–108)
pO2, Arterial: 127 mm[Hg] — ABNORMAL HIGH (ref 83–108)
pO2, Arterial: 62 mm[Hg] — ABNORMAL LOW (ref 83–108)

## 2023-05-27 LAB — BASIC METABOLIC PANEL
Anion gap: 17 — ABNORMAL HIGH (ref 5–15)
Anion gap: 11 (ref 5–15)
BUN: 14 mg/dL (ref 8–23)
BUN: 14 mg/dL (ref 8–23)
CO2: 21 mmol/L — ABNORMAL LOW (ref 22–32)
CO2: 20 mmol/L — ABNORMAL LOW (ref 22–32)
Calcium: 8 mg/dL — ABNORMAL LOW (ref 8.9–10.3)
Calcium: 7.5 mg/dL — ABNORMAL LOW (ref 8.9–10.3)
Chloride: 110 mmol/L (ref 98–111)
Chloride: 106 mmol/L (ref 98–111)
Creatinine, Ser: 1.34 mg/dL — ABNORMAL HIGH (ref 0.61–1.24)
Creatinine, Ser: 1.11 mg/dL (ref 0.61–1.24)
GFR, Estimated: 54 mL/min — ABNORMAL LOW (ref >60)
GFR, Estimated: >60 mL/min (ref >60)
Glucose, Bld: 151 mg/dL — ABNORMAL HIGH (ref 70–99)
Glucose, Bld: 220 mg/dL — ABNORMAL HIGH (ref 70–99)
Potassium: 3.8 mmol/L (ref 3.5–5.1)
Potassium: 4 mmol/L (ref 3.5–5.1)
Sodium: 148 mmol/L — ABNORMAL HIGH (ref 135–145)
Sodium: 137 mmol/L (ref 135–145)

## 2023-05-27 LAB — POCT I-STAT, CHEM 8
BUN: 16 mg/dL (ref 8–23)
BUN: 20 mg/dL (ref 8–23)
BUN: 17 mg/dL (ref 8–23)
BUN: 19 mg/dL (ref 8–23)
BUN: 18 mg/dL (ref 8–23)
Calcium, Ion: 1 mmol/L — ABNORMAL LOW (ref 1.15–1.40)
Calcium, Ion: 1.12 mmol/L — ABNORMAL LOW (ref 1.15–1.40)
Calcium, Ion: 1.07 mmol/L — ABNORMAL LOW (ref 1.15–1.40)
Calcium, Ion: 1.04 mmol/L — ABNORMAL LOW (ref 1.15–1.40)
Calcium, Ion: 1.23 mmol/L (ref 1.15–1.40)
Chloride: 101 mmol/L (ref 98–111)
Chloride: 104 mmol/L (ref 98–111)
Chloride: 103 mmol/L (ref 98–111)
Chloride: 103 mmol/L (ref 98–111)
Chloride: 103 mmol/L (ref 98–111)
Creatinine, Ser: 0.7 mg/dL (ref 0.61–1.24)
Creatinine, Ser: 0.8 mg/dL (ref 0.61–1.24)
Creatinine, Ser: 0.8 mg/dL (ref 0.61–1.24)
Creatinine, Ser: 0.8 mg/dL (ref 0.61–1.24)
Creatinine, Ser: 0.9 mg/dL (ref 0.61–1.24)
Glucose, Bld: 105 mg/dL — ABNORMAL HIGH (ref 70–99)
Glucose, Bld: 115 mg/dL — ABNORMAL HIGH (ref 70–99)
Glucose, Bld: 137 mg/dL — ABNORMAL HIGH (ref 70–99)
Glucose, Bld: 147 mg/dL — ABNORMAL HIGH (ref 70–99)
Glucose, Bld: 161 mg/dL — ABNORMAL HIGH (ref 70–99)
HCT: 28 % — ABNORMAL LOW (ref 39.0–52.0)
HCT: 34 % — ABNORMAL LOW (ref 39.0–52.0)
HCT: 29 % — ABNORMAL LOW (ref 39.0–52.0)
HCT: 35 % — ABNORMAL LOW (ref 39.0–52.0)
HCT: 30 % — ABNORMAL LOW (ref 39.0–52.0)
Hemoglobin: 11.6 g/dL — ABNORMAL LOW (ref 13.0–17.0)
Hemoglobin: 9.5 g/dL — ABNORMAL LOW (ref 13.0–17.0)
Hemoglobin: 9.9 g/dL — ABNORMAL LOW (ref 13.0–17.0)
Hemoglobin: 11.9 g/dL — ABNORMAL LOW (ref 13.0–17.0)
Hemoglobin: 10.2 g/dL — ABNORMAL LOW (ref 13.0–17.0)
Potassium: 4 mmol/L (ref 3.5–5.1)
Potassium: 4.1 mmol/L (ref 3.5–5.1)
Potassium: 5.1 mmol/L (ref 3.5–5.1)
Potassium: 5.2 mmol/L — ABNORMAL HIGH (ref 3.5–5.1)
Potassium: 4.2 mmol/L (ref 3.5–5.1)
Sodium: 140 mmol/L (ref 135–145)
Sodium: 141 mmol/L (ref 135–145)
Sodium: 138 mmol/L (ref 135–145)
Sodium: 136 mmol/L (ref 135–145)
Sodium: 138 mmol/L (ref 135–145)
TCO2: 25 mmol/L (ref 22–32)
TCO2: 26 mmol/L (ref 22–32)
TCO2: 28 mmol/L (ref 22–32)
TCO2: 25 mmol/L (ref 22–32)
TCO2: 18 mmol/L — ABNORMAL LOW (ref 22–32)

## 2023-05-27 LAB — CBC
HCT: 38.6 % — ABNORMAL LOW (ref 39.0–52.0)
HCT: 34.5 % — ABNORMAL LOW (ref 39.0–52.0)
Hemoglobin: 12.8 g/dL — ABNORMAL LOW (ref 13.0–17.0)
Hemoglobin: 11.3 g/dL — ABNORMAL LOW (ref 13.0–17.0)
MCH: 31 pg (ref 26.0–34.0)
MCH: 30.5 pg (ref 26.0–34.0)
MCHC: 33.2 g/dL (ref 30.0–36.0)
MCHC: 32.8 g/dL (ref 30.0–36.0)
MCV: 93.5 fL (ref 80.0–100.0)
MCV: 93.2 fL (ref 80.0–100.0)
Platelets: 132 10*3/uL — ABNORMAL LOW (ref 150–400)
Platelets: 128 10*3/uL — ABNORMAL LOW (ref 150–400)
RBC: 4.13 MIL/uL — ABNORMAL LOW (ref 4.22–5.81)
RBC: 3.7 MIL/uL — ABNORMAL LOW (ref 4.22–5.81)
RDW: 13.2 % (ref 11.5–15.5)
RDW: 13.2 % (ref 11.5–15.5)
WBC: 17.7 10*3/uL — ABNORMAL HIGH (ref 4.0–10.5)
WBC: 16.3 10*3/uL — ABNORMAL HIGH (ref 4.0–10.5)
nRBC: 0 % (ref 0.0–0.2)
nRBC: 0 % (ref 0.0–0.2)

## 2023-05-27 LAB — PLATELET COUNT: Platelets: 133 10*3/uL — ABNORMAL LOW (ref 150–400)

## 2023-05-27 LAB — GLUCOSE, CAPILLARY
Glucose-Capillary: 136 mg/dL — ABNORMAL HIGH (ref 70–99)
Glucose-Capillary: 127 mg/dL — ABNORMAL HIGH (ref 70–99)
Glucose-Capillary: 133 mg/dL — ABNORMAL HIGH (ref 70–99)
Glucose-Capillary: 140 mg/dL — ABNORMAL HIGH (ref 70–99)
Glucose-Capillary: 196 mg/dL — ABNORMAL HIGH (ref 70–99)
Glucose-Capillary: 204 mg/dL — ABNORMAL HIGH (ref 70–99)
Glucose-Capillary: 189 mg/dL — ABNORMAL HIGH (ref 70–99)
Glucose-Capillary: 170 mg/dL — ABNORMAL HIGH (ref 70–99)

## 2023-05-27 LAB — HEMOGLOBIN AND HEMATOCRIT, BLOOD
HCT: 30 % — ABNORMAL LOW (ref 39.0–52.0)
Hemoglobin: 10.2 g/dL — ABNORMAL LOW (ref 13.0–17.0)

## 2023-05-27 LAB — APTT: aPTT: 33 s (ref 24–36)

## 2023-05-27 LAB — PROTIME-INR
INR: 1.7 — ABNORMAL HIGH (ref 0.8–1.2)
Prothrombin Time: 19.8 s — ABNORMAL HIGH (ref 11.4–15.2)

## 2023-05-27 LAB — MAGNESIUM
Magnesium: 3.3 mg/dL — ABNORMAL HIGH (ref 1.7–2.4)
Magnesium: 2.7 mg/dL — ABNORMAL HIGH (ref 1.7–2.4)

## 2023-05-27 LAB — ABO/RH: ABO/RH(D): A POS

## 2023-05-27 SURGERY — REPAIR, MITRAL VALVE
Anesthesia: General | Site: Chest

## 2023-05-27 MED ORDER — BISACODYL 10 MG RE SUPP
10.0000 mg | Freq: Every day | RECTAL | Status: DC
Start: 2023-05-28 — End: 2023-06-09
  Administered 2023-06-08: 10 mg via RECTAL
  Filled 2023-05-27: qty 1

## 2023-05-27 MED ORDER — CHLORHEXIDINE GLUCONATE 4 % EX SOLN: 30.0000 mL | CUTANEOUS | Status: DC

## 2023-05-27 MED ORDER — AMIODARONE HCL IN DEXTROSE 360-4.14 MG/200ML-% IV SOLN
60.0000 mg/h | INTRAVENOUS | Status: DC
  Administered 2023-05-27 – 2023-05-28 (×2): 60 mg/h via INTRAVENOUS
  Filled 2023-05-27: qty 200

## 2023-05-27 MED ORDER — SODIUM BICARBONATE 8.4 % IV SOLN
100.0000 meq | Freq: Once | INTRAVENOUS | Status: AC
  Administered 2023-05-27: 100 meq via INTRAVENOUS

## 2023-05-27 MED ORDER — ACETAMINOPHEN 160 MG/5ML PO SOLN
650.0000 mg | Freq: Once | ORAL | Status: AC
  Administered 2023-05-27: 650 mg
  Filled 2023-05-27: qty 20.3

## 2023-05-27 MED ORDER — METOPROLOL TARTRATE 5 MG/5ML IV SOLN
2.5000 mg | INTRAVENOUS | Status: DC | PRN
  Administered 2023-06-02: 2.5 mg via INTRAVENOUS
  Filled 2023-05-27: qty 5

## 2023-05-27 MED ORDER — SODIUM CHLORIDE 0.9% FLUSH
3.0000 mL | Freq: Two times a day (BID) | INTRAVENOUS | Status: DC
  Administered 2023-05-28 – 2023-06-05 (×14): 3 mL via INTRAVENOUS

## 2023-05-27 MED ORDER — SODIUM CHLORIDE 0.9% FLUSH: 3.0000 mL | INTRAVENOUS | Status: DC | PRN

## 2023-05-27 MED ORDER — VANCOMYCIN HCL IN DEXTROSE 1-5 GM/200ML-% IV SOLN
1000.0000 mg | Freq: Once | INTRAVENOUS | Status: AC
  Administered 2023-05-27: 1000 mg via INTRAVENOUS
  Filled 2023-05-27: qty 200

## 2023-05-27 MED ORDER — 0.9 % SODIUM CHLORIDE (POUR BTL) OPTIME
TOPICAL | Status: DC | PRN
  Administered 2023-05-27: 5000 mL

## 2023-05-27 MED ORDER — MIDAZOLAM HCL 2 MG/2ML IJ SOLN: 2.0000 mg | INTRAMUSCULAR | Status: DC | PRN

## 2023-05-27 MED ORDER — ACETAMINOPHEN 500 MG PO TABS
1000.0000 mg | ORAL_TABLET | Freq: Four times a day (QID) | ORAL | Status: AC
  Administered 2023-05-28 – 2023-06-01 (×16): 1000 mg via ORAL
  Filled 2023-05-27 (×17): qty 2

## 2023-05-27 MED ORDER — TRAMADOL HCL 50 MG PO TABS
50.0000 mg | ORAL_TABLET | ORAL | Status: DC | PRN
  Administered 2023-05-27 – 2023-05-28 (×3): 50 mg via ORAL
  Administered 2023-05-28 – 2023-05-29 (×3): 100 mg via ORAL
  Administered 2023-05-30: 50 mg via ORAL
  Administered 2023-05-31 (×2): 100 mg via ORAL
  Filled 2023-05-27 (×2): qty 1
  Filled 2023-05-27 (×2): qty 2
  Filled 2023-05-27: qty 1
  Filled 2023-05-27: qty 2
  Filled 2023-05-27 (×2): qty 1
  Filled 2023-05-27 (×2): qty 2

## 2023-05-27 MED ORDER — ASPIRIN 81 MG PO CHEW
324.0000 mg | CHEWABLE_TABLET | Freq: Once | ORAL | Status: AC
  Administered 2023-05-27: 324 mg via ORAL
  Filled 2023-05-27: qty 4

## 2023-05-27 MED ORDER — PROPOFOL 10 MG/ML IV BOLUS
INTRAVENOUS | Status: DC | PRN
  Administered 2023-05-27: 100 mg via INTRAVENOUS

## 2023-05-27 MED ORDER — AMIODARONE HCL 200 MG PO TABS: 400.0000 mg | ORAL_TABLET | Freq: Two times a day (BID) | ORAL | Status: DC

## 2023-05-27 MED ORDER — BISACODYL 5 MG PO TBEC
10.0000 mg | DELAYED_RELEASE_TABLET | Freq: Every day | ORAL | Status: DC
  Administered 2023-05-28 – 2023-06-09 (×9): 10 mg via ORAL
  Filled 2023-05-27 (×11): qty 2

## 2023-05-27 MED ORDER — METOPROLOL TARTRATE 25 MG/10 ML ORAL SUSPENSION: 12.5000 mg | Freq: Two times a day (BID) | ORAL | Status: DC

## 2023-05-27 MED ORDER — NICARDIPINE HCL IN NACL 20-0.86 MG/200ML-% IV SOLN: 0.0000 mg/h | INTRAVENOUS | Status: DC

## 2023-05-27 MED ORDER — PAPAVERINE HCL 30 MG/ML IJ SOLN
INTRAMUSCULAR | Status: DC | PRN
  Administered 2023-05-27: 500 mL via INTRAVASCULAR

## 2023-05-27 MED ORDER — VASOPRESSIN 20 UNITS/100 ML INFUSION FOR SHOCK
0.0000 [IU]/min | INTRAVENOUS | Status: DC
  Administered 2023-05-27 – 2023-05-28 (×2): 0.04 [IU]/min via INTRAVENOUS
  Filled 2023-05-27 (×2): qty 100

## 2023-05-27 MED ORDER — CHLORHEXIDINE GLUCONATE 0.12 % MT SOLN
15.0000 mL | OROMUCOSAL | Status: AC
  Administered 2023-05-27: 15 mL via OROMUCOSAL
  Filled 2023-05-27: qty 15

## 2023-05-27 MED ORDER — POTASSIUM CHLORIDE 10 MEQ/50ML IV SOLN
10.0000 meq | INTRAVENOUS | Status: AC
  Administered 2023-05-27 (×3): 10 meq via INTRAVENOUS
  Filled 2023-05-27: qty 50

## 2023-05-27 MED ORDER — SODIUM CHLORIDE 0.9 % IV SOLN: INTRAVENOUS | Status: DC | PRN

## 2023-05-27 MED ORDER — ONDANSETRON HCL 4 MG/2ML IJ SOLN
4.0000 mg | Freq: Four times a day (QID) | INTRAMUSCULAR | Status: DC | PRN
  Administered 2023-06-09 – 2023-06-10 (×2): 4 mg via INTRAVENOUS
  Filled 2023-05-27 (×2): qty 2

## 2023-05-27 MED ORDER — LACTATED RINGERS IV SOLN: INTRAVENOUS | Status: AC

## 2023-05-27 MED ORDER — AMIODARONE HCL IN DEXTROSE 360-4.14 MG/200ML-% IV SOLN
30.0000 mg/h | INTRAVENOUS | Status: DC
  Administered 2023-05-27: 30 mg/h via INTRAVENOUS
  Filled 2023-05-27 (×2): qty 200

## 2023-05-27 MED ORDER — METOCLOPRAMIDE HCL 5 MG/ML IJ SOLN
10.0000 mg | Freq: Four times a day (QID) | INTRAMUSCULAR | Status: AC
  Administered 2023-05-27 – 2023-05-28 (×6): 10 mg via INTRAVENOUS
  Filled 2023-05-27 (×6): qty 2

## 2023-05-27 MED ORDER — ORAL CARE MOUTH RINSE: 15.0000 mL | OROMUCOSAL | Status: DC | PRN

## 2023-05-27 MED ORDER — MORPHINE SULFATE (PF) 2 MG/ML IV SOLN
1.0000 mg | INTRAVENOUS | Status: DC | PRN
  Administered 2023-05-30: 2 mg via INTRAVENOUS
  Filled 2023-05-27: qty 1

## 2023-05-27 MED ORDER — LACTATED RINGERS IV SOLN: INTRAVENOUS | Status: DC | PRN

## 2023-05-27 MED ORDER — ALBUMIN HUMAN 5 % IV SOLN
250.0000 mL | INTRAVENOUS | Status: DC | PRN
  Administered 2023-05-27: 250 mL via INTRAVENOUS
  Administered 2023-05-27 (×3): 12.5 g via INTRAVENOUS
  Filled 2023-05-27 (×2): qty 250

## 2023-05-27 MED ORDER — OXYCODONE HCL 5 MG PO TABS
5.0000 mg | ORAL_TABLET | ORAL | Status: DC | PRN
  Administered 2023-06-04: 5 mg via ORAL
  Administered 2023-06-04: 10 mg via ORAL
  Filled 2023-05-27: qty 1
  Filled 2023-05-27: qty 2

## 2023-05-27 MED ORDER — AMIODARONE LOAD VIA INFUSION
150.0000 mg | Freq: Once | INTRAVENOUS | Status: AC
Start: 2023-05-27 — End: 2023-05-27
  Administered 2023-05-27: 150 mg via INTRAVENOUS
  Filled 2023-05-27: qty 83.34

## 2023-05-27 MED ORDER — CHLORHEXIDINE GLUCONATE CLOTH 2 % EX PADS
6.0000 | MEDICATED_PAD | Freq: Every day | CUTANEOUS | Status: DC
  Administered 2023-05-27 – 2023-06-18 (×23): 6 via TOPICAL

## 2023-05-27 MED ORDER — PANTOPRAZOLE SODIUM 40 MG PO TBEC
40.0000 mg | DELAYED_RELEASE_TABLET | Freq: Every day | ORAL | Status: DC
  Administered 2023-05-29 – 2023-06-09 (×12): 40 mg via ORAL
  Filled 2023-05-27 (×12): qty 1

## 2023-05-27 MED ORDER — ASPIRIN 325 MG PO TBEC
325.0000 mg | DELAYED_RELEASE_TABLET | Freq: Every day | ORAL | Status: DC
  Administered 2023-05-28 – 2023-05-29 (×2): 325 mg via ORAL
  Filled 2023-05-27 (×2): qty 1

## 2023-05-27 MED ORDER — FENTANYL CITRATE (PF) 250 MCG/5ML IJ SOLN
INTRAMUSCULAR | Status: DC | PRN
  Administered 2023-05-27 (×2): 50 ug via INTRAVENOUS
  Administered 2023-05-27: 100 ug via INTRAVENOUS
  Administered 2023-05-27 (×2): 150 ug via INTRAVENOUS

## 2023-05-27 MED ORDER — NOREPINEPHRINE 4 MG/250ML-% IV SOLN
INTRAVENOUS | Status: AC
Start: 2023-05-27 — End: 2023-05-28
  Filled 2023-05-27: qty 250

## 2023-05-27 MED ORDER — PHENYLEPHRINE HCL-NACL 20-0.9 MG/250ML-% IV SOLN
0.0000 ug/min | INTRAVENOUS | Status: DC
  Administered 2023-05-28: 20 ug/min via INTRAVENOUS
  Filled 2023-05-27: qty 250

## 2023-05-27 MED ORDER — PANTOPRAZOLE SODIUM 40 MG IV SOLR
40.0000 mg | Freq: Every day | INTRAVENOUS | Status: AC
  Administered 2023-05-27 – 2023-05-28 (×2): 40 mg via INTRAVENOUS
  Filled 2023-05-27 (×2): qty 10

## 2023-05-27 MED ORDER — MIDAZOLAM HCL 2 MG/2ML IJ SOLN
INTRAMUSCULAR | Status: AC
  Filled 2023-05-27: qty 2

## 2023-05-27 MED ORDER — FENTANYL CITRATE (PF) 250 MCG/5ML IJ SOLN
INTRAMUSCULAR | Status: AC
  Filled 2023-05-27: qty 5

## 2023-05-27 MED ORDER — ONDANSETRON HCL 4 MG/2ML IJ SOLN
INTRAMUSCULAR | Status: DC | PRN
  Administered 2023-05-27: 4 mg via INTRAVENOUS

## 2023-05-27 MED ORDER — NITROGLYCERIN 0.2 MG/ML ON CALL CATH LAB
INTRAVENOUS | Status: DC | PRN
  Administered 2023-05-27: 10 ug via INTRAVENOUS

## 2023-05-27 MED ORDER — SODIUM BICARBONATE 8.4 % IV SOLN
INTRAVENOUS | Status: AC
  Filled 2023-05-27: qty 50

## 2023-05-27 MED ORDER — ORAL CARE MOUTH RINSE
15.0000 mL | OROMUCOSAL | Status: DC
  Administered 2023-05-27 – 2023-05-28 (×3): 15 mL via OROMUCOSAL

## 2023-05-27 MED ORDER — MAGNESIUM SULFATE 4 GM/100ML IV SOLN
4.0000 g | Freq: Once | INTRAVENOUS | Status: AC
  Administered 2023-05-27: 4 g via INTRAVENOUS
  Filled 2023-05-27: qty 100

## 2023-05-27 MED ORDER — ROCURONIUM BROMIDE 10 MG/ML (PF) SYRINGE
PREFILLED_SYRINGE | INTRAVENOUS | Status: DC | PRN
  Administered 2023-05-27: 70 mg via INTRAVENOUS
  Administered 2023-05-27 (×2): 30 mg via INTRAVENOUS

## 2023-05-27 MED ORDER — MIDAZOLAM HCL (PF) 5 MG/ML IJ SOLN
INTRAMUSCULAR | Status: DC | PRN
  Administered 2023-05-27: 1 mg via INTRAVENOUS

## 2023-05-27 MED ORDER — PHENYLEPHRINE 80 MCG/ML (10ML) SYRINGE FOR IV PUSH (FOR BLOOD PRESSURE SUPPORT)
PREFILLED_SYRINGE | INTRAVENOUS | Status: DC | PRN
  Administered 2023-05-27 (×4): 80 ug via INTRAVENOUS

## 2023-05-27 MED ORDER — ASPIRIN 81 MG PO CHEW: 324.0000 mg | CHEWABLE_TABLET | Freq: Every day | ORAL | Status: DC

## 2023-05-27 MED ORDER — NOREPINEPHRINE 4 MG/250ML-% IV SOLN
0.0000 ug/min | INTRAVENOUS | Status: DC
  Administered 2023-05-27: 2 ug/min via INTRAVENOUS

## 2023-05-27 MED ORDER — SODIUM CHLORIDE 0.9% FLUSH
3.0000 mL | Freq: Two times a day (BID) | INTRAVENOUS | Status: DC
  Administered 2023-05-27: 10 mL via INTRAVENOUS
  Administered 2023-05-27: 3 mL via INTRAVENOUS
  Administered 2023-05-28 – 2023-06-01 (×8): 10 mL via INTRAVENOUS
  Administered 2023-06-01 – 2023-06-02 (×2): 3 mL via INTRAVENOUS

## 2023-05-27 MED ORDER — SODIUM CHLORIDE 0.9 % IV SOLN: 250.0000 mL | INTRAVENOUS | Status: AC

## 2023-05-27 MED ORDER — CEFAZOLIN SODIUM-DEXTROSE 2-4 GM/100ML-% IV SOLN
2.0000 g | Freq: Three times a day (TID) | INTRAVENOUS | Status: AC
  Administered 2023-05-27 – 2023-05-29 (×6): 2 g via INTRAVENOUS
  Filled 2023-05-27 (×6): qty 100

## 2023-05-27 MED ORDER — ~~LOC~~ CARDIAC SURGERY, PATIENT & FAMILY EDUCATION
Freq: Once | Status: DC
  Filled 2023-05-27: qty 1

## 2023-05-27 MED ORDER — SODIUM BICARBONATE 8.4 % IV SOLN
50.0000 meq | Freq: Once | INTRAVENOUS | Status: AC
  Administered 2023-05-27: 50 meq via INTRAVENOUS

## 2023-05-27 MED ORDER — SUGAMMADEX SODIUM 200 MG/2ML IV SOLN
INTRAVENOUS | Status: DC | PRN
Start: 2023-05-27 — End: 2023-05-27
  Administered 2023-05-27: 200 mg via INTRAVENOUS

## 2023-05-27 MED ORDER — DOCUSATE SODIUM 100 MG PO CAPS
200.0000 mg | ORAL_CAPSULE | Freq: Every day | ORAL | Status: DC
  Administered 2023-05-28 – 2023-06-04 (×5): 200 mg via ORAL
  Filled 2023-05-27 (×8): qty 2

## 2023-05-27 MED ORDER — METOPROLOL TARTRATE 12.5 MG HALF TABLET
12.5000 mg | ORAL_TABLET | Freq: Two times a day (BID) | ORAL | Status: DC
  Filled 2023-05-27: qty 1

## 2023-05-27 MED ORDER — PROPOFOL 10 MG/ML IV BOLUS
INTRAVENOUS | Status: AC
  Filled 2023-05-27: qty 20

## 2023-05-27 MED ORDER — AMIODARONE HCL IN DEXTROSE 360-4.14 MG/200ML-% IV SOLN
30.0000 mg/h | INTRAVENOUS | Status: DC
  Filled 2023-05-27: qty 200

## 2023-05-27 MED ORDER — PROPOFOL 500 MG/50ML IV EMUL
INTRAVENOUS | Status: DC | PRN
  Administered 2023-05-27: 30 ug/kg/min via INTRAVENOUS

## 2023-05-27 MED ORDER — METOPROLOL TARTRATE 12.5 MG HALF TABLET: 12.5000 mg | ORAL_TABLET | Freq: Once | ORAL | Status: DC

## 2023-05-27 MED ORDER — VANCOMYCIN HCL 1000 MG IV SOLR
INTRAVENOUS | Status: DC | PRN
  Administered 2023-05-27: 1000 mL

## 2023-05-27 MED ORDER — CHLORHEXIDINE GLUCONATE 0.12 % MT SOLN
15.0000 mL | Freq: Once | OROMUCOSAL | Status: AC
  Administered 2023-05-27: 15 mL via OROMUCOSAL
  Filled 2023-05-27: qty 15

## 2023-05-27 MED ORDER — DEXMEDETOMIDINE HCL IN NACL 400 MCG/100ML IV SOLN
0.0000 ug/kg/h | INTRAVENOUS | Status: DC
  Administered 2023-05-27: 0.5 ug/kg/h via INTRAVENOUS
  Filled 2023-05-27: qty 100

## 2023-05-27 MED ORDER — PROTAMINE SULFATE 10 MG/ML IV SOLN
INTRAVENOUS | Status: DC | PRN
  Administered 2023-05-27: 20 mg via INTRAVENOUS
  Administered 2023-05-27: 250 mg via INTRAVENOUS

## 2023-05-27 MED ORDER — HEPARIN SODIUM (PORCINE) 1000 UNIT/ML IJ SOLN
INTRAMUSCULAR | Status: DC | PRN
  Administered 2023-05-27: 33000 [IU] via INTRAVENOUS

## 2023-05-27 MED ORDER — INSULIN REGULAR(HUMAN) IN NACL 100-0.9 UT/100ML-% IV SOLN: INTRAVENOUS | Status: DC

## 2023-05-27 MED ORDER — EPINEPHRINE HCL 5 MG/250ML IV SOLN IN NS: 2.0000 ug/min | INTRAVENOUS | Status: DC

## 2023-05-27 MED ORDER — METHADONE HCL IV SYRINGE 10 MG/ML FOR CABG
0.2000 mg/kg | Freq: Once | INTRAMUSCULAR | Status: AC
  Administered 2023-05-27: 15 mg via INTRAVENOUS
  Filled 2023-05-27: qty 1.5

## 2023-05-27 MED ORDER — DEXTROSE 50 % IV SOLN: 0.0000 mL | INTRAVENOUS | Status: DC | PRN

## 2023-05-27 MED ORDER — ACETAMINOPHEN 160 MG/5ML PO SOLN: 1000.0000 mg | Freq: Four times a day (QID) | ORAL | Status: AC

## 2023-05-27 MED ORDER — SODIUM CHLORIDE 0.45 % IV SOLN: INTRAVENOUS | Status: AC | PRN

## 2023-05-27 SURGICAL SUPPLY — 83 items
ADAPTER CARDIO PERF ANTE/RETRO (ADAPTER) IMPLANT
ANTIFOG SOL W/FOAM PAD STRL (MISCELLANEOUS) ×2 IMPLANT
BAG DECANTER FOR FLEXI CONT (MISCELLANEOUS) ×3 IMPLANT
BLADE CLIPPER SURG (BLADE) ×3 IMPLANT
BLADE STERNUM SYSTEM 6 (BLADE) ×3 IMPLANT
BLADE SURG 11 STRL SS (BLADE) IMPLANT
BLADE SURG 15 STRL LF DISP TIS (BLADE) IMPLANT
CANISTER SUCT 3000ML PPV (MISCELLANEOUS) ×3 IMPLANT
CANN PRFSN 3/8XCNCT ST RT ANG (MISCELLANEOUS) ×2 IMPLANT
CANNULA NON VENT 20FR 12 (CANNULA) ×3 IMPLANT
CANNULA NON VENT 22FR 12 (CANNULA) ×3 IMPLANT
CANNULA PRFSN 3/8XCNCT RT ANG (MISCELLANEOUS) IMPLANT
CANNULA SUMP PERICARDIAL (CANNULA) ×3 IMPLANT
CANNULA VRC MALB SNGL STG 36FR (MISCELLANEOUS) IMPLANT
CATH HEART VENT LEFT (CATHETERS) ×3 IMPLANT
CATH RETROPLEGIA CORONARY 14FR (CATHETERS) IMPLANT
CATH ROBINSON RED A/P 18FR (CATHETERS) ×9 IMPLANT
CATH THOR STR 32F SOFT 20 RADI (CATHETERS) ×3 IMPLANT
CATH THORACIC 28FR RT ANG (CATHETERS) ×3 IMPLANT
CLAMP ISOLATOR SYNERGY LG (MISCELLANEOUS) IMPLANT
CLAMP SUTURE YELLOW 5 PAIRS (MISCELLANEOUS) ×3 IMPLANT
CLIP TI MEDIUM 24 (CLIP) IMPLANT
CLIP TI WIDE RED SMALL 24 (CLIP) IMPLANT
DEVICE SUT CK QUICK LOAD MINI (Prosthesis & Implant Heart) IMPLANT
DRAPE CV SPLIT W-CLR ANES SCRN (DRAPES) ×3 IMPLANT
DRAPE INCISE IOBAN 66X45 STRL (DRAPES) ×3 IMPLANT
DRAPE PERI GROIN 82X75IN TIB (DRAPES) ×3 IMPLANT
DRSG AQUACEL AG ADV 3.5X10 (GAUZE/BANDAGES/DRESSINGS) ×3 IMPLANT
ELECT CAUTERY BLADE 6.4 (BLADE) ×3 IMPLANT
ELECT REM PT RETURN 9FT ADLT (ELECTROSURGICAL) ×4 IMPLANT
ELECTRODE REM PT RTRN 9FT ADLT (ELECTROSURGICAL) ×6 IMPLANT
FELT TEFLON 1X6 (MISCELLANEOUS) ×3 IMPLANT
GAUZE SPONGE 4X4 12PLY STRL (GAUZE/BANDAGES/DRESSINGS) ×3 IMPLANT
GOWN STRL REUS W/ TWL LRG LVL3 (GOWN DISPOSABLE) ×18 IMPLANT
INSERT FOGARTY XLG (MISCELLANEOUS) ×3 IMPLANT
IRRIG SUCT STRYKERFLOW 2 WTIP (MISCELLANEOUS) IMPLANT
IRRIGATION SUCT STRKRFLW 2 WTP (MISCELLANEOUS) IMPLANT
KIT BASIN OR (CUSTOM PROCEDURE TRAY) ×3 IMPLANT
KIT DEVICE COR-KNOT MIS 5X31 (Prosthesis & Implant Heart) IMPLANT
KIT SUT CK MINI COMBO 4X17 (Prosthesis & Implant Heart) IMPLANT
KIT TURNOVER KIT B (KITS) ×3 IMPLANT
LINE VENT (MISCELLANEOUS) IMPLANT
NDL SUT 4 .5 CRC FRENCH EYE (NEEDLE) IMPLANT
NS IRRIG 1000ML POUR BTL (IV SOLUTION) ×18 IMPLANT
ORGANIZER SUTURE GABBAY-FRATER (MISCELLANEOUS) ×3 IMPLANT
PACK E OPEN HEART (SUTURE) ×3 IMPLANT
PACK OPEN HEART (CUSTOM PROCEDURE TRAY) ×3 IMPLANT
PAD ARMBOARD 7.5X6 YLW CONV (MISCELLANEOUS) ×6 IMPLANT
PAD ELECT DEFIB RADIOL ZOLL (MISCELLANEOUS) ×3 IMPLANT
PENCIL BUTTON HOLSTER BLD 10FT (ELECTRODE) ×3 IMPLANT
POSITIONER HEAD DONUT 9IN (MISCELLANEOUS) ×3 IMPLANT
PROBE CRYO2-ABLATION MALLABLE (MISCELLANEOUS) IMPLANT
RING ANNULOPLASTY SIMULUS 32 (Prosthesis & Implant Heart) IMPLANT
RING TRICUSPID T34 (Prosthesis & Implant Heart) IMPLANT
SET MPS 3-ND DEL (MISCELLANEOUS) IMPLANT
SOLUTION ANTFG W/FOAM PAD STRL (MISCELLANEOUS) ×3 IMPLANT
SPONGE T-LAP 18X18 ~~LOC~~+RFID (SPONGE) IMPLANT
SUT ETHIBOND (SUTURE) IMPLANT
SUT ETHIBOND 2 0 SH 36X2 (SUTURE) IMPLANT
SUT ETHIBOND 2-0 RB-1 WHT (SUTURE) IMPLANT
SUT ETHIBOND 3 0 SH 1 (SUTURE) IMPLANT
SUT MNCRL AB 4-0 PS2 18 (SUTURE) ×6 IMPLANT
SUT PROLENE 4 0 SH DA (SUTURE) ×9 IMPLANT
SUT PROLENE 4-0 RB1 .5 CRCL 36 (SUTURE) ×6 IMPLANT
SUT STEEL 6MS V (SUTURE) ×3 IMPLANT
SUT STEEL SZ 6 DBL 3X14 BALL (SUTURE) ×6 IMPLANT
SUT VIC AB 0 CTX36XBRD ANTBCTR (SUTURE) ×6 IMPLANT
SUT VIC AB 2-0 CT1 TAPERPNT 27 (SUTURE) ×6 IMPLANT
SYR BULB IRRIG 60ML STRL (SYRINGE) IMPLANT
SYS CLIP PENDITURE LAA 50 (Clip) ×2 IMPLANT
SYSTEM CLIP PENDITURE LAA 50 (Clip) IMPLANT
SYSTEM SAHARA CHEST DRAIN ATS (WOUND CARE) ×3 IMPLANT
TAG SUTURE CLAMP YLW 5PR (MISCELLANEOUS) ×2 IMPLANT
TAPE CLOTH SURG 4X10 WHT LF (GAUZE/BANDAGES/DRESSINGS) IMPLANT
TAPE PAPER 2X10 WHT MICROPORE (GAUZE/BANDAGES/DRESSINGS) IMPLANT
TOWEL GREEN STERILE (TOWEL DISPOSABLE) ×3 IMPLANT
TOWEL GREEN STERILE FF (TOWEL DISPOSABLE) ×3 IMPLANT
TRAY FOLEY SLVR 14FR TEMP STAT (SET/KITS/TRAYS/PACK) IMPLANT
TRAY FOLEY SLVR 16FR TEMP STAT (SET/KITS/TRAYS/PACK) ×3 IMPLANT
UNDERPAD 30X36 HEAVY ABSORB (UNDERPADS AND DIAPERS) ×3 IMPLANT
VENT LEFT HEART 12002 (CATHETERS) ×2 IMPLANT
VRC MALLEABLE SINGLE STG 36FR (MISCELLANEOUS) ×2 IMPLANT
WATER STERILE IRR 1000ML POUR (IV SOLUTION) ×6 IMPLANT

## 2023-05-27 NOTE — Hospital Course (Addendum)
 [name] Pyo, MD [name], MD  History of present illness: The patient is a 78 year old male referred to Dr. Leafy Ro for symptomatic severe mitral regurgitation,  tricuspid regurgitation and atrial fibrillation.  He was originally being considered for MitraClip therapy but ultimately decision was made to proceed with surgical intervention.  Hospital course: The patient was admitted electively on 05/27/2023 and taken to the operating room at which time he underwent a mitral valve repair using 32 Medtronic stimulus semirigid annuloplasty band, tricuspid valve repair using 34 Edwards MC 3 tricuspid annuloplasty ring, and full maze procedure.  Possibly a left atrial appendage clip was placed.  He tolerated the procedure well and was taken to the surgical intensive care unit in stable condition.  Postoperative hospital course:  Patient was extubated without difficulty using standard post cardiac surgical protocols.  He has remained hemodynamically stable and inotropes/vasopressors have been weaned off.  He has required pacemaker backup for tachy/bradycardia syndrome including atrial fibrillation with RVR and EP has been consulted to assist with management.  He does have expected volume overload and has been diuresed appropriately.  He is felt to be related to surgery and not clinically relevant or related to CHF.  Renal function has been monitored closely and there was some elevation noted to a creatinine of 1.47 on postop day 3.  Chest tubes have had moderate drainage and were removed on postop day 3.  By postop day 4, he was back in rate controlled atrial fibrillation the pacemaker was placed in VVI backup mode.  The EP team recommended starting low-dose IV Cardizem for rate control.  Amiodarone and beta blockers were held per EP recommendations.  He has an expected acute blood loss anemia and values have been very stable.  Most recent H/H is 11.9 and 35.9.  He was continued to be monitored closely  by EP and ultimately was determined and attempted DC cardioversion should be done and this was performed on 06/03/2023.  Due to the tacky bradycardia nature of his dysrhythmias and the fact that he remained bradycardic requiring VVI pacer backup it was also determined that this should proceed with permanent pacemaker and this was also done on 06/03/2023.  He has subsequently developed atrial fibrillation with RVR and is being treated with amiodarone drip and beta-blockers.  Eliquis has also been initiated.  He will likely require short-term skilled nursing facility for ongoing rehabilitation and evaluation is in place by physical and occupational therapy as well as assistance from [facility].  He was stable for transfer to the progressive care unit on 3/1.  He developed abdominal distention consistent with post operative ileus per KUB.  He was kept on a liquid diet.  He was give lactulose and suppository with good results.  His diet was advanced as he tolerated.   His a fib rate was increased on 03/03. He was given IV Amiodarone bolus and his Amiodarone and Toprol XL early. Nurse then notified me that patient's systolic BP is now in the 80's and per nurse, she already gave both Lasix and Spironolactone. She also reported that patient had about 10 ml of blood when he had a bowel movement. Per patient, he does not have a history of hemorrhoids (per nurse, he has hemorrhoids) and he is on Protonix. His H and H yesterday was stable at 11.9 and 35.9. He is not on oral iron. Will give Albumin for low BP, check CBC this am, and stop Lasix.  I spoke with EP PA on call  about a fib with RVR.  Amiodarone drip was ordered and not started until 03/03 . I stopped Toprol XL and Spironolactone as well because of persistent hypotension.  Patient had a lot more bleeding from rectum. Patient made NPO.  Apixaban was stopped as well as aspirin. Because of persistent hypotension and acute GI bleed likely lower), will transfer back to [facility] ICU.  Stat CBC showed H and H result showed decreased to 9.7 and 29.2. GI was consulted. He was given multiple PRBCs, put on IV bid Protonix and Levophed drip. He developed AKI. Creatinine went as high as 2.57 . He had leukocytosis as well. WBC went from 15,800 on 03/02 to 24,600 03/04. He was put on Zosyn;? ischemic colitis or diverticulitis. A line and femoral lines were removed on 03/06. PICC line was placed as has poor peripheral access. His leukocytosis did resolve as WBC was down to 10,200 on 03/07. Zosyn was finished on 03/09. His AKI resolved as well as creatinine down to 1.23 on 03/07. He was transfused 2 more units on this day as he had further lower GI bleeding. He remained on an Amiodarone drip for a fib with RVR. GI recommended colonoscopy which showed likely ischemic colitis as the cause of his GI bleed as well as a large lesion that was biopsied and would need to be removed as an outpatient. His GI bleed resolved, anemia improved and he began tolerating a heart healthy diet. He remained in atrial fibrillation with controlled rates, IV amiodarone was transitioned to PO Amiodarone. He was stable for transfer from the ICU to 4E for further convalescence on 03/08. Per discussion with Dr. Leafy Ro, patient was put on ec asa 81 mg daily, Digoxin 0.25 mg daily, and Trinsicon daily. Accu checks and SS PRN were stopped (pre diabetes). PT/OT were re consulted as he was very deconditioned.

## 2023-05-27 NOTE — Anesthesia Procedure Notes (Signed)
Procedure Name: Intubation Date/Time: 05/27/2023 7:49 AM  Performed by: Sudie Grumbling, CRNAPre-anesthesia Checklist: Patient identified, Emergency Drugs available, Suction available and Patient being monitored Patient Re-evaluated:Patient Re-evaluated prior to induction Oxygen Delivery Method: Circle system utilized Preoxygenation: Pre-oxygenation with 100% oxygen Induction Type: IV induction Ventilation: Mask ventilation without difficulty Laryngoscope Size: Mac and 4 Grade View: Grade II Tube type: Oral Tube size: 8.0 mm Number of attempts: 1 Airway Equipment and Method: Stylet and Oral airway Placement Confirmation: ETT inserted through vocal cords under direct vision, positive ETCO2 and breath sounds checked- equal and bilateral Secured at: 23 cm Tube secured with: Tape Dental Injury: Teeth and Oropharynx as per pre-operative assessment

## 2023-05-27 NOTE — Interval H&P Note (Signed)
History and Physical Interval Note:  05/27/2023 6:22 AM  [name] [name]  has presented today for surgery, with the diagnosis of SEVERE MR MODERATE TR AFIB.  The various methods of treatment have been discussed with the patient and family. After consideration of risks, benefits and other options for treatment, the patient has consented to  Procedure(s): MITRAL VALVE REPAIR or replacement (MVR) (N/A) TRICUSPID VALVE REPAIR (N/A) MAZE (N/A) TRANSESOPHAGEAL ECHOCARDIOGRAM (TEE) (N/A) as a surgical intervention.  The patient's history has been reviewed, patient examined, no change in status, stable for surgery.  I have reviewed the patient's chart and labs.  Questions were answered to the patient's satisfaction.     Eugenio Hoes

## 2023-05-27 NOTE — Brief Op Note (Signed)
05/27/2023  1:00 PM  PATIENT:  [name] [name]  78 y.o. male  PRE-OPERATIVE DIAGNOSIS:  SEVERE MITRAL REGURGITATION, MODERATE TRICUSPID REGURGITATION, ATRIAL FIBRILLATION  POST-OPERATIVE DIAGNOSIS:  SEVERE MITRAL REGURGITATION, MODERATE TRICUSPID REGURGITATION, ATRIAL FIBRILLATION  PROCEDURE:  Procedure(s): MITRAL VALVE REPAIR USING MEDTRONIC SIMULUS SEMI-RIDGID ANNULOPLASTY BAND (MVR) (N/A) TRICUSPID VALVE REPAIR USING EDWARDS MC3 TRICUSPID ANNULOPLASTY RING (N/A) MAZE (N/A) TRANSESOPHAGEAL ECHOCARDIOGRAM (TEE) (N/A) Left atrial closure with a 50 mm Medtronic clip  SURGEON:  Surgeons and Role:    Eugenio Hoes, MD - Primary  PHYSICIAN ASSISTANT: Calven Gilkes PA-C  ASSISTANTS: Dorathy Daft HAYES RNFA   ANESTHESIA:   general  EBL:  800 mL   BLOOD ADMINISTERED:none  DRAINS:  MEDIASTINAL CHEST TUBES X 2    LOCAL MEDICATIONS USED:  NONE  SPECIMEN:  Source of Specimen:  POSTERIOR LEAFLET P2 TRIANGULAR RESECTION  DISPOSITION OF SPECIMEN:  PATHOLOGY  COUNTS:  YES  TOURNIQUET:  * No tourniquets in log *  DICTATION: .Dragon Dictation  PLAN OF CARE: Admit to inpatient   PATIENT DISPOSITION:  ICU - intubated and hemodynamically stable.   Delay start of Pharmacological VTE agent (>24hrs) due to surgical blood loss or risk of bleeding: yes  COMPLICATIONS: NO KNOWN

## 2023-05-27 NOTE — Anesthesia Preprocedure Evaluation (Signed)
Anesthesia Evaluation  Patient identified by MRN, date of birth, ID band Patient awake    Reviewed: Allergy & Precautions, NPO status , Patient's Chart, lab work & pertinent test results  Airway Mallampati: II  TM Distance: >3 FB Neck ROM: Full    Dental no notable dental hx.    Pulmonary neg pulmonary ROS   Pulmonary exam normal        Cardiovascular hypertension, + dysrhythmias  Rhythm:Regular Rate:Normal  ECHO:  1. Left ventricular ejection fraction, by estimation, is 45 to 50%. The  left ventricle has mildly decreased function. The left ventricle  demonstrates global hypokinesis. Left ventricular diastolic function could  not be evaluated.   2. Right ventricular systolic function is low normal. The right  ventricular size is mildly enlarged. There is mildly elevated pulmonary  artery systolic pressure. The estimated right ventricular systolic  pressure is 32.9 mmHg.   3. Left atrial size was severely dilated. No left atrial/left atrial  appendage thrombus was detected.   4. Right atrial size was moderately dilated.   5. Mitral valve area by planimetry is >8 cm sq. Gradients could not be  accurately obtained due to extreme tachycardia. There are 2 distinct jets  of mitral insufficiency: the larger is due to P2 prolapse, with an  eccentric anteriorly directed  wall-hugging jet. The smaller is at the P2-P3 junction and has a central  jet. The vena contracta of the major mitral insufficiency jet was 4 mm and  the PISA radius was 1.0 cm (Nyquist limit 39 cm/s). At the area of maximum  prolapse the length of the  posterior leaflet is 16 mm. The leaflets are only mildly thickened. The  mitral valve is myxomatous. Severe mitral valve regurgitation. There is  severe holosystolic prolapse of the middle scallop of the posterior  leaflet of the mitral valve.   6. The tricuspid valve is myxomatous. Tricuspid valve regurgitation is   moderate.   7. The aortic valve is tricuspid. There is mild thickening of the aortic  valve. Aortic valve regurgitation is not visualized. Aortic valve  sclerosis is present, with no evidence of aortic valve stenosis.    Cath: Prox LAD to Mid LAD lesion is 30% stenosed.  1.  Mild nonobstructive coronary artery disease. 2.  Fick cardiac output of 6.1 L/min and Fick cardiac index of 2.9 L/min/m with the following hemodynamics:             Right atrial pressure mean of 13 mmHg             Right ventricular pressure 35/3 with end-diastolic pressure of 13 mmHg             Wedge pressure mean of 20 mmHg with V waves to 25 mmHg             Pulmonary artery pressure 38/21 with a mean of 29 mmHg             PVR of 0.66 Woods units             PA pulsatility index of 1.3 3.  LVEDP of 23 mmHg    Neuro/Psych CVA  negative psych ROS   GI/Hepatic negative GI ROS, Neg liver ROS,,,  Endo/Other  negative endocrine ROS    Renal/GU   negative genitourinary   Musculoskeletal  (+) Arthritis , Osteoarthritis,    Abdominal Normal abdominal exam  (+)   Peds  Hematology Lab Results      Component  Value               Date                      WBC                      6.3                 05/23/2023                HGB                      14.8                05/23/2023                HCT                      45.3                05/23/2023                MCV                      93.8                05/23/2023                PLT                      215                 05/23/2023              Anesthesia Other Findings   Reproductive/Obstetrics                             Anesthesia Physical Anesthesia Plan  ASA: 4  Anesthesia Plan: General   Post-op Pain Management:    Induction: Intravenous  PONV Risk Score and Plan: 2 and Treatment may vary due to age or medical condition and Propofol infusion  Airway Management Planned: Mask and Oral  ETT  Additional Equipment: Arterial line, CVP, PA Cath, TEE, 3D TEE and Ultrasound Guidance Line Placement  Intra-op Plan:   Post-operative Plan: Post-operative intubation/ventilation  Informed Consent: I have reviewed the patients History and Physical, chart, labs and discussed the procedure including the risks, benefits and alternatives for the proposed anesthesia with the patient or authorized representative who has indicated his/her understanding and acceptance.     Dental advisory given  Plan Discussed with: CRNA  Anesthesia Plan Comments:        Anesthesia Quick Evaluation

## 2023-05-27 NOTE — Op Note (Signed)
CARDIOVASCULAR SURGERY OPERATIVE NOTE  05/27/2023 [name] [name] [id number]  Surgeon:  Ashley Akin, MD  First Assistant: Gershon Crane [facility]                               An experienced assistant was required given the complexity of this surgery and the standard of surgical care. The assistant was needed for exposure, dissection, suctioning, retraction of delicate tissues and sutures, instrument exchange and for overall help during this procedure.     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 We have discussed the risks, goals, and recovery from surgery and will schedule following resolution of his UTI and dental work.   Findings: The left ventricle had an EF approximately 45 to 50%.  The mitral regurgitation was severe from P2 prolapse and the tricuspid valve regurgitation was quite severe with a very dilated annulus.  At the conclusion the procedure there was the patient in a normal sinus rhythm with trace mitral regurgitation with a mean gradient of 1 mmHg and mild tricuspid regurgitation with an EF of approximately 40 to 45%.  Preparation:  The patient was seen in the preoperative holding area and the correct patient, correct operation were confirmed with the patient after reviewing the medical record and  catheterization. The consent was signed by me. Preoperative antibiotics were given. A pulmonary arterial line and radial arterial line were placed by the anesthesia team. The patient was taken back to the operating room and positioned supine on the operating room table. After being placed under general endotracheal anesthesia by the anesthesia team a foley catheter was placed. The neck, chest, abdomen, and both legs were prepped with betadine soap and solution and draped in the usual sterile manner. A surgical time-out was taken and the correct patient and operative procedure were confirmed with the nursing and anesthesia staff.  Operation: Median sternotomy incision was then created sternal valve the sternal saw.  Pericardial well was developed and heparin delivered.  Aorta was cannulated with a 20 [place] Starns aortic cannula and a 36 French straight cannulas placed in the right atrial appendage and directed to the inferior vena cava.  A 28 French right angle cannula was placed in the superior vena cava.  With adequate confirmation of anticoagulation cardiopulmonary bypass was instituted.  The right pulmonary veins were then surrounded and with the AtriCure radiofrequency clamp 3 sets  of 2 burns were created.  An antegrade cardioplegia catheter was placed in the ascending aorta.  Aortic cross-clamp was placed and cold blood potassium cardioplegia delivered antegrade for total of 1500 cc.  Reanimation dose was delivered as part of cross-clamp removal The left-sided pulmonary veins were then isolated with the AtriCure radiofrequency clamp for 3 sets of 2 burns and a 50 mm Medtronic clip was placed at the base of the left atrial appendage. The left atrium was opened in the atrial groove and a series of cryo lesions were created with 1 exterior of the coronary sinus, second the mitral annular lesion, third the roof and then the floor lesions were created. 3-0 Ethibond sutures were then placed circle annularly in  the posterior annulus in anticipation of a semirigid rigid band.  The P2 segment was then resected in a triangular fashion and reconstructed with running 5-0 Prolene suture.  A 32 mm semirigid band was then secured with the cor knot system and under saline load test had excellent coaptation. Left atrium was then closed over a ventricular sump with running 4-0 Prolene suture. The right atrium was then opened horizontally and the AtriCure clamp was placed at the base into the superior vena cava and then into the inferior vena cava for a series of 1 set of 2 burns.  The cryoprobe was then utilized at the apex of the atriotomy to the 1 o'clock position on the tricuspid annulus for the third lesion. 3-0 Ethibond sutures were then placed in the tricuspid annulus in anticipation of the MC3 ring and this was secured being a 34 mm running with the cor knot system.  The patient was placed in headdown position and the aortic cross-clamp was removed as the right atrium was closed with a running 4-0 Prolene suture. Atrial and ventricular pacing wires were placed and brought out the inferior stab wounds and secured. With adequate de-airing the left ventricular sump was removed and the patient was weaned from cardiopulmonary bypass on a small amount of epinephrine support.  The adequate hemodynamics protamine was delivered and the patient was decannulated and sites oversewn were necessary.  Chest tubes were brought inferior stab was secured and with adequate hemostasis the sternum was reapproximated with interrupted stainless steel wire and the presternal subcutaneous tissue and skin were closed multilayer's absorbable suture.  Sterile dressings were applied.

## 2023-05-27 NOTE — Anesthesia Procedure Notes (Signed)
Arterial Line Insertion Start/End2/18/2025 7:00 AM, 05/27/2023 7:10 AM Performed by: Atilano Median, DO, Sudie Grumbling, RN, CRNA  Patient location: Pre-op. Preanesthetic checklist: patient identified, IV checked, site marked, risks and benefits discussed, surgical consent, monitors and equipment checked, pre-op evaluation, timeout performed and anesthesia consent Lidocaine 1% used for infiltration and patient sedated Left, radial was placed Catheter size: 20 G Hand hygiene performed , maximum sterile barriers used  and Seldinger technique used  Attempts: 1 Procedure performed without using ultrasound guided technique. Following insertion, Biopatch and dressing applied.

## 2023-05-27 NOTE — Consult Note (Signed)
NAME:  [name] [name], MRN:  [id number], DOB:  January 03, 1946, LOS: 0 ADMISSION DATE:  05/27/2023, CONSULTATION DATE:  2/18 REFERRING MD:  Leafy Ro, CHIEF COMPLAINT:  post op critical care support    History of Present Illness:  78 year old male w/ medical hx as outlined below. Followed by both cards and CVTS for mod/svr MR w/ progressive fatigue. [facility]ed to [facility] 2/18 for elective MR/TR and MAZE Intra-op echo 45-50% EF, LV global hypokinesis, mildly elevated PAS,  RV mildly enlarged.  Bypass started 0852 ended 1118  cell saver  To CVICU post op  PCCM asked to assist w/ post op care  Pertinent  Medical History  Severe MR, mod TR, HFmrEF (EF 55%), Dysrhythmia, HL, PAF s/p ablation 2012 (on xarelto), Prior CVA,chronic UTIs, prostate cancer s/p prostatectomy  Significant Hospital Events: Including procedures, antibiotic start and stop dates in addition to other pertinent events     Interim History / Subjective:  Sedated. Still on both epi and neo infusions MAP just shy of 65.   Objective   Blood pressure 111/76, pulse 79, temperature 97.8 F (36.6 C), temperature source Oral, resp. rate 14, height 5\' 10"  (1.778 m), weight 93 kg, SpO2 95%. PAP: (18-42)/(8-22) 40/22      Intake/Output Summary (Last 24 hours) at 05/27/2023 1021 Last data filed at 05/27/2023 1017 Gross per 24 hour  Intake 1200 ml  Output 400 ml  Net 800 ml   Filed Weights   05/27/23 0606  Weight: 93 kg    Examination: General: sedated 78 year old male s/p open heart surg. Currently on full vent support. Requiring vasoactive gtts to support hemodynamic goals HENT: NCAT orally intubated # 8  Lungs: clear w/ equal chest rise. PCXR ETT good position. The right internal jugular SGC is in region of pulm artery, some new faint patchy airspace disease, left basilar vol loss. ABG reviewed. Vent changes made Cardiovascular: paced at 80 bpm. Mid sternal dressing intact. 90 cc out of mediastinal tube Abdomen: soft   Extremities: warm good pulses  Neuro: sedated  GU: clear yellow   Resolved Hospital Problem list     Assessment & Plan:  H/o Severe mitral regurg, HTN, HFmrEF and Afib now s/p MV and TV repair w/ MAZE procedure 2/18 Plan Cont tele  External pacer, watching for underlying CHB tubes and drains per surgical team w/ close obs of CT output and occult evidence of bleeding (notify surgical team if output > 1/2hr Or 250 ml in hr Post operative pain rx  Complete post op abx Wean epi for CI >1.8 and phenylephrine for MAP > 65 Resume statin  Holding lasix and Cozzar  Toprol XL 50mg /d  on hold, resuming lopressor 12.5mg  bid once shock resolved (hold for hypotension) Post op ASA  Glycemic control goal 140-180  Mild metabolic acidosis Plan 1 amp bicarb administered Vent changes made to optimize resp compensation Repeat pre-weaning abg  Chronic AF (prior ablation) now s/p MAZE Plan Tele  Cont amiodarone gtt.  Lopressor as above  Defer AC to surgical team   Post operative vent management  Plan Wean sedation once hemodynamically stable  CVTS rapid wean protocol  VAP bundle Cont pulse ox   Best Practice (right click and "Reselect all SmartList Selections" daily)   Diet/type: NPO DVT prophylaxis SCD Pressure ulcer(s): N/A GI prophylaxis: PPI Lines: Central line Foley:  Yes, and it is still needed Code Status:  full code Last date of multidisciplinary goals of care discussion [per primary ]  Labs   CBC: Recent Labs  Lab 05/23/23 1108 05/27/23 0756 05/27/23 0840 05/27/23 0857 05/27/23 0902 05/27/23 0951  WBC 6.3  --   --   --   --   --   HGB 14.8 13.6 11.6* 10.2* 9.5* 9.9*  HCT 45.3 40.0 34.0* 30.0* 28.0* 29.0*  MCV 93.8  --   --   --   --   --   PLT 215  --   --   --   --   --     Basic Metabolic Panel: Recent Labs  Lab 05/23/23 1108 05/27/23 0756 05/27/23 0840 05/27/23 0857 05/27/23 0902 05/27/23 0951  NA 142 142 140 142 141 138  K 4.0 3.7 4.1 4.1  4.0 5.1  CL 108  --  104  --  101 103  CO2 25  --   --   --   --   --   GLUCOSE 84  --  115*  --  105* 137*  BUN 16  --  20  --  16 17  CREATININE 1.03  --  0.80  --  0.70 0.80  CALCIUM 9.2  --   --   --   --   --    GFR: Estimated Creatinine Clearance: 84.4 mL/min (by C-G formula based on SCr of 0.8 mg/dL). Recent Labs  Lab 05/23/23 1108  WBC 6.3    Liver Function Tests: Recent Labs  Lab 05/23/23 1108  AST 23  ALT 20  ALKPHOS 53  BILITOT 1.4*  PROT 6.7  ALBUMIN 3.7   No results for input(s): "LIPASE", "AMYLASE" in the last 168 hours. No results for input(s): "AMMONIA" in the last 168 hours.  ABG    Component Value Date/Time   PHART 7.407 05/27/2023 0857   PCO2ART 38.3 05/27/2023 0857   PO2ART 432 (H) 05/27/2023 0857   HCO3 24.1 05/27/2023 0857   TCO2 28 05/27/2023 0951   ACIDBASEDEF 3.0 (H) 05/27/2023 0756   O2SAT 100 05/27/2023 0857     Coagulation Profile: Recent Labs  Lab 05/23/23 1108  INR 1.2    Cardiac Enzymes: No results for input(s): "CKTOTAL", "CKMB", "CKMBINDEX", "TROPONINI" in the last 168 hours.  HbA1C: Hgb A1c MFr Bld  Date/Time Value Ref Range Status  05/23/2023 11:08 AM 5.8 (H) 4.8 - 5.6 % Final    Comment:    (NOTE) Pre diabetes:          5.7%-6.4%  Diabetes:              >6.4%  Glycemic control for   <7.0% adults with diabetes   07/10/2009 10:30 PM (H) 4.6 - 6.1 % Final   6.2 (NOTE) The ADA recommends the following therapeutic goal for glycemic control related to Hgb A1c measurement: Goal of therapy: <6.5 Hgb A1c  Reference: American Diabetes Association: Clinical Practice Recommendations 2010, Diabetes Care, 2010, 33: (Suppl  1).    CBG: No results for input(s): "GLUCAP" in the last 168 hours.  Review of Systems:   Not able   Past Medical History:  He,  has a past medical history of Arthritis, CVA (cerebral infarction) (07/14/2009), Dysrhythmia, Heart murmur, History of kidney stones, Hyperlipidemia, Hypertension,  Nephrolithiasis, Paroxysmal atrial fibrillation (HCC), Paroxysmal atrial flutter (HCC), Prostate cancer (HCC), and Stroke (HCC).   Surgical History:   Past Surgical History:  Procedure Laterality Date   APPENDECTOMY  1967   CATARACT EXTRACTION W/ INTRAOCULAR LENS IMPLANT Bilateral    CERVICAL SPINE SURGERY  Cadaver Bone Placed   COLONOSCOPY     CYSTOSCOPY/URETEROSCOPY/HOLMIUM LASER/STENT PLACEMENT Left 09/19/2020   Procedure: CYSTOSCOPY RETROGRADE PYELOGRAM URETEROSCOPY/HOLMIUM LASER/STENT PLACEMENT;  Surgeon: Bjorn Pippin, MD;  Location: [facility] ORS;  Service: Urology;  Laterality: Left;   EYE SURGERY Bilateral    Eye Lid lifting   LUMBAR SPINE SURGERY     Ruptured Disc   PROSTATECTOMY  2005   RIGHT/LEFT HEART CATH AND CORONARY ANGIOGRAPHY N/A 02/27/2023   Procedure: RIGHT/LEFT HEART CATH AND CORONARY ANGIOGRAPHY;  Surgeon: [name], MD;  Location: [facility] INVASIVE CV LAB;  Service: Cardiovascular;  Laterality: N/A;   TEE WITHOUT CARDIOVERSION N/A 02/07/2023   Procedure: TRANSESOPHAGEAL ECHOCARDIOGRAM;  Surgeon: Thurmon Fair, MD;  Location: [facility] INVASIVE CV LAB;  Service: Cardiovascular;  Laterality: N/A;     Social History:   reports that he has never smoked. He has never used smokeless tobacco. He reports that he does not drink alcohol and does not use drugs.   Family History:  His family history includes Heart disease (age of onset: 65) in his mother; Prostate cancer in his father; Testicular cancer in his brother.   Allergies No Known Allergies   Home Medications  Prior to Admission medications   Medication Sig Start Date End Date Taking? Authorizing Provider  amoxicillin-clavulanate (AUGMENTIN) 500-125 MG tablet Take 1 tablet by mouth at bedtime.   Yes [provider]  atorvastatin (LIPITOR) 20 MG tablet Take 1 tablet by mouth once daily 05/21/23  Yes [name], [name], MD  furosemide (LASIX) 20 MG tablet Take 1 tablet (20 mg total) by mouth daily. Patient taking  differently: Take 20 mg by mouth daily as needed for fluid or edema. 02/27/23 02/27/24 Yes [name], MD  losartan (COZAAR) 25 MG tablet Take 1 tablet (25 mg total) by mouth daily. Patient taking differently: Take 25 mg by mouth every evening. 02/14/23  Yes [name], MD  metoprolol succinate (TOPROL-XL) 50 MG 24 hr tablet Take 1 tablet (50 mg total) by mouth at bedtime. Take with or immediately following a meal. Patient taking differently: Take 75 mg by mouth at bedtime. Take with or immediately following a meal. 02/14/23  Yes [name], MD  rivaroxaban (XARELTO) 20 MG TABS tablet TAKE 1 TABLET BY MOUTH ONCE DAILY WITH SUPPER 01/27/23  Yes Marinus Maw, MD  traMADol (ULTRAM) 50 MG tablet TAKE 1 TABLET BY MOUTH EVERY 6 HOURS AS NEEDED. 09/04/22  Yes [name], MD     Critical care time: 45 min

## 2023-05-27 NOTE — Procedures (Signed)
Extubation Procedure Note  Patient Details:   Name: [name] [name] DOB: July 04, 1945 MRN: [id number]   Airway Documentation:    Vent end date: 05/27/23 Vent end time: 1805   Evaluation  O2 sats: stable throughout Complications: No apparent complications Patient did tolerate procedure well. Bilateral Breath Sounds: Clear, Diminished   Yes  Pt extubated per rapid weaning protocol. Placed on 4L [place]. Pt was unable to get NIF >-10, VC .74ml performed with great effort. No stridor heard, positive cuff leak noted. RT will continue to monitor.   Vicente Masson 05/27/2023, 6:11 PM

## 2023-05-27 NOTE — Anesthesia Procedure Notes (Signed)
Central Venous Catheter Insertion Performed by: Atilano Median, DO, anesthesiologist Start/End2/18/2025 7:00 AM, 05/27/2023 7:15 AM Patient location: Pre-op. Preanesthetic checklist: patient identified, IV checked, site marked, risks and benefits discussed, surgical consent, monitors and equipment checked, pre-op evaluation, timeout performed and anesthesia consent Position: Trendelenburg Lidocaine 1% used for infiltration and patient sedated Hand hygiene performed  and maximum sterile barriers used  Catheter size: 9 Fr Central line was placed.Sheath introducer Procedure performed using ultrasound guided technique. Ultrasound Notes:anatomy identified, needle tip was noted to be adjacent to the nerve/plexus identified, no ultrasound evidence of intravascular and/or intraneural injection and image(s) printed for medical record Attempts: 1 Following insertion, line sutured and dressing applied. Post procedure assessment: blood return through all ports, free fluid flow and no air  Patient tolerated the procedure well with no immediate complications.

## 2023-05-27 NOTE — Transfer of Care (Signed)
Immediate Anesthesia Transfer of Care Note  Patient: [name] [name]  Procedure(s) Performed: MITRAL VALVE REPAIR USING MEDTRONIC SIMULUS SEMI-RIDGID ANNULOPLASTY BAND (MVR) (Chest) TRICUSPID VALVE REPAIR USING EDWARDS MC3 TRICUSPID ANNULOPLASTY RING (Chest) MAZE (Chest) TRANSESOPHAGEAL ECHOCARDIOGRAM (TEE)  Patient Location: SICU  Anesthesia Type:General  Level of Consciousness: sedated and Patient remains intubated per anesthesia plan  Airway & Oxygen Therapy: Patient remains intubated per anesthesia plan and Patient placed on Ventilator (see vital sign flow sheet for setting)  Post-op Assessment: Report given to RN and Post -op Vital signs reviewed and stable  Post vital signs: Reviewed and stable  Last Vitals:  Vitals Value Taken Time  BP    Temp    Pulse    Resp    SpO2      Last Pain:  Vitals:   05/27/23 0606  TempSrc: Oral  PainSc: 0-No pain      Patients Stated Pain Goal: 0 (05/27/23 0606)  Complications: No notable events documented.

## 2023-05-27 NOTE — Anesthesia Procedure Notes (Signed)
Central Venous Catheter Insertion Performed by: Atilano Median, DO, anesthesiologist Start/End2/18/2025 7:16 AM, 05/27/2023 7:17 AM Patient location: Pre-op. Preanesthetic checklist: patient identified, IV checked, site marked, risks and benefits discussed, surgical consent, monitors and equipment checked, pre-op evaluation, timeout performed and anesthesia consent Position: supine Hand hygiene performed  and maximum sterile barriers used  PA cath was placed.Swan type:thermodilution PA Cath depth:50 Procedure performed without using ultrasound guided technique. Attempts: 1 Patient tolerated the procedure well with no immediate complications.

## 2023-05-28 ENCOUNTER — Inpatient Hospital Stay (HOSPITAL_COMMUNITY): Payer: Medicare Other

## 2023-05-28 ENCOUNTER — Encounter (HOSPITAL_COMMUNITY): Payer: Self-pay | Admitting: Thoracic Surgery (Cardiothoracic Vascular Surgery)

## 2023-05-28 DIAGNOSIS — I959 Hypotension, unspecified: Secondary | ICD-10-CM | POA: Diagnosis not present

## 2023-05-28 DIAGNOSIS — R739 Hyperglycemia, unspecified: Secondary | ICD-10-CM | POA: Diagnosis not present

## 2023-05-28 DIAGNOSIS — Z9889 Other specified postprocedural states: Secondary | ICD-10-CM | POA: Diagnosis not present

## 2023-05-28 DIAGNOSIS — D62 Acute posthemorrhagic anemia: Secondary | ICD-10-CM | POA: Diagnosis not present

## 2023-05-28 LAB — ECHO INTRAOPERATIVE TEE
AR max vel: 2.6 cm2
AV Area VTI: 2.54 cm2
AV Area mean vel: 2.29 cm2
AV Mean grad: 2 mm[Hg]
AV Peak grad: 2.9 mm[Hg]
Ao pk vel: 0.86 m/s
Height: 70 in
MV M vel: 4.08 m/s
MV Peak grad: 66.6 mm[Hg]
MV VTI: 3.44 cm2
MV Vena cont: 0.63 cm
Radius: 0.8 cm
Weight: 3280 [oz_av]

## 2023-05-28 LAB — CBC
HCT: 32 % — ABNORMAL LOW (ref 39.0–52.0)
HCT: 33.2 % — ABNORMAL LOW (ref 39.0–52.0)
Hemoglobin: 10.4 g/dL — ABNORMAL LOW (ref 13.0–17.0)
Hemoglobin: 10.9 g/dL — ABNORMAL LOW (ref 13.0–17.0)
MCH: 30.4 pg (ref 26.0–34.0)
MCH: 31 pg (ref 26.0–34.0)
MCHC: 32.5 g/dL (ref 30.0–36.0)
MCHC: 32.8 g/dL (ref 30.0–36.0)
MCV: 93.6 fL (ref 80.0–100.0)
MCV: 94.3 fL (ref 80.0–100.0)
Platelets: 110 10*3/uL — ABNORMAL LOW (ref 150–400)
Platelets: 99 10*3/uL — ABNORMAL LOW (ref 150–400)
RBC: 3.42 MIL/uL — ABNORMAL LOW (ref 4.22–5.81)
RBC: 3.52 MIL/uL — ABNORMAL LOW (ref 4.22–5.81)
RDW: 13.4 % (ref 11.5–15.5)
RDW: 13.7 % (ref 11.5–15.5)
WBC: 14.3 10*3/uL — ABNORMAL HIGH (ref 4.0–10.5)
WBC: 16.4 10*3/uL — ABNORMAL HIGH (ref 4.0–10.5)
nRBC: 0 % (ref 0.0–0.2)
nRBC: 0 % (ref 0.0–0.2)

## 2023-05-28 LAB — BASIC METABOLIC PANEL
Anion gap: 10 (ref 5–15)
Anion gap: 10 (ref 5–15)
BUN: 14 mg/dL (ref 8–23)
BUN: 21 mg/dL (ref 8–23)
CO2: 20 mmol/L — ABNORMAL LOW (ref 22–32)
CO2: 21 mmol/L — ABNORMAL LOW (ref 22–32)
Calcium: 7.8 mg/dL — ABNORMAL LOW (ref 8.9–10.3)
Calcium: 7.9 mg/dL — ABNORMAL LOW (ref 8.9–10.3)
Chloride: 106 mmol/L (ref 98–111)
Chloride: 105 mmol/L (ref 98–111)
Creatinine, Ser: 1.08 mg/dL (ref 0.61–1.24)
Creatinine, Ser: 1.24 mg/dL (ref 0.61–1.24)
GFR, Estimated: >60 mL/min (ref >60)
GFR, Estimated: 59 mL/min — ABNORMAL LOW (ref >60)
Glucose, Bld: 138 mg/dL — ABNORMAL HIGH (ref 70–99)
Glucose, Bld: 150 mg/dL — ABNORMAL HIGH (ref 70–99)
Potassium: 4.1 mmol/L (ref 3.5–5.1)
Potassium: 4.6 mmol/L (ref 3.5–5.1)
Sodium: 136 mmol/L (ref 135–145)
Sodium: 136 mmol/L (ref 135–145)

## 2023-05-28 LAB — POCT I-STAT, CHEM 8
BUN: 17 mg/dL (ref 8–23)
Calcium, Ion: 1.16 mmol/L (ref 1.15–1.40)
Chloride: 103 mmol/L (ref 98–111)
Creatinine, Ser: 0.9 mg/dL (ref 0.61–1.24)
Glucose, Bld: 139 mg/dL — ABNORMAL HIGH (ref 70–99)
HCT: 30 % — ABNORMAL LOW (ref 39.0–52.0)
Hemoglobin: 10.2 g/dL — ABNORMAL LOW (ref 13.0–17.0)
Potassium: 4.4 mmol/L (ref 3.5–5.1)
Sodium: 140 mmol/L (ref 135–145)
TCO2: 24 mmol/L (ref 22–32)

## 2023-05-28 LAB — SURGICAL PATHOLOGY

## 2023-05-28 LAB — GLUCOSE, CAPILLARY
Glucose-Capillary: 114 mg/dL — ABNORMAL HIGH (ref 70–99)
Glucose-Capillary: 126 mg/dL — ABNORMAL HIGH (ref 70–99)
Glucose-Capillary: 127 mg/dL — ABNORMAL HIGH (ref 70–99)
Glucose-Capillary: 129 mg/dL — ABNORMAL HIGH (ref 70–99)
Glucose-Capillary: 131 mg/dL — ABNORMAL HIGH (ref 70–99)
Glucose-Capillary: 144 mg/dL — ABNORMAL HIGH (ref 70–99)
Glucose-Capillary: 152 mg/dL — ABNORMAL HIGH (ref 70–99)
Glucose-Capillary: 93 mg/dL (ref 70–99)

## 2023-05-28 LAB — MAGNESIUM
Magnesium: 2.4 mg/dL (ref 1.7–2.4)
Magnesium: 2.3 mg/dL (ref 1.7–2.4)

## 2023-05-28 MED ORDER — FUROSEMIDE 10 MG/ML IJ SOLN
40.0000 mg | Freq: Once | INTRAMUSCULAR | Status: AC
  Administered 2023-05-28: 40 mg via INTRAVENOUS
  Filled 2023-05-28: qty 4

## 2023-05-28 MED ORDER — POTASSIUM CHLORIDE CRYS ER 20 MEQ PO TBCR
20.0000 meq | EXTENDED_RELEASE_TABLET | Freq: Once | ORAL | Status: AC
  Administered 2023-05-28: 20 meq via ORAL
  Filled 2023-05-28: qty 1

## 2023-05-28 MED ORDER — ORAL CARE MOUTH RINSE: 15.0000 mL | OROMUCOSAL | Status: DC | PRN

## 2023-05-28 MED ORDER — AMIODARONE HCL 200 MG PO TABS
400.0000 mg | ORAL_TABLET | Freq: Three times a day (TID) | ORAL | Status: DC
  Administered 2023-05-28 – 2023-05-29 (×3): 400 mg via ORAL
  Filled 2023-05-28 (×3): qty 2

## 2023-05-28 MED ORDER — AMIODARONE LOAD VIA INFUSION
150.0000 mg | Freq: Once | INTRAVENOUS | Status: AC
  Administered 2023-05-28: 150 mg via INTRAVENOUS
  Filled 2023-05-28: qty 83.34

## 2023-05-28 MED ORDER — AMIODARONE HCL IN DEXTROSE 360-4.14 MG/200ML-% IV SOLN
60.0000 mg/h | INTRAVENOUS | Status: DC
  Administered 2023-05-28 (×2): 60 mg/h via INTRAVENOUS

## 2023-05-28 MED ORDER — AMIODARONE HCL IN DEXTROSE 360-4.14 MG/200ML-% IV SOLN: 30.0000 mg/h | INTRAVENOUS | Status: DC

## 2023-05-28 MED ORDER — INSULIN ASPART 100 UNIT/ML IJ SOLN
0.0000 [IU] | INTRAMUSCULAR | Status: DC
  Administered 2023-05-28: 2 [IU] via SUBCUTANEOUS

## 2023-05-28 NOTE — TOC Initial Note (Signed)
Transition of Care [facility]) - Initial/Assessment Note    Patient Details  Name: [name] [name] MRN: [id number] Date of Birth: October 31, 1945  Transition of Care [facility]) CM/SW Contact:    Gala Lewandowsky, RN Phone Number: 05/28/2023, 11:22 AM  Clinical Narrative:  Patient was discussed in progression rounds this morning-POD-1 MVR. PTA patient was independent from home with spouse. Patient states he has PCP and was driving to appointments prior to hospitalization. Patient has been prearranged for [facility] PT with [facility] per office protocol. Case Manager will continue to follow for additional transition of care needs as he progresses.                 Expected Discharge Plan: Home w Home Health Services Barriers to Discharge: Continued Medical Work up   Patient Goals and CMS Choice Patient states their goals for this hospitalization and ongoing recovery are:: to return home once stable.   Choice offered to / list presented to : NA      Expected Discharge Plan and Services   Discharge Planning Services: CM Consult Post Acute Care Choice: Home Health Living arrangements for the past 2 months: Single Family Home  Prior Living Arrangements/Services Living arrangements for the past 2 months: Single Family Home Lives with:: Spouse Patient language and need for interpreter reviewed:: Yes Do you feel safe going back to the place where you live?: Yes      Need for Family Participation in Patient Care: Yes (Comment) Care giver support system in place?: Yes (comment)   Criminal Activity/Legal Involvement Pertinent to Current Situation/Hospitalization: No - Comment as needed  Activities of Daily Living   ADL Screening (condition at time of admission) Independently performs ADLs?: Yes (appropriate for developmental age) Is the patient deaf or have difficulty hearing?: No Does the patient have difficulty seeing, even when wearing glasses/contacts?: No Does the patient have difficulty  concentrating, remembering, or making decisions?: No  Permission Sought/Granted Permission sought to share information with : Family Supports, Case Manager    Emotional Assessment Appearance:: Appears stated age Attitude/Demeanor/Rapport: Engaged Affect (typically observed): Appropriate Orientation: : Oriented to Self, Oriented to Place, Oriented to  Time, Oriented to Situation Alcohol / Substance Use: Not Applicable Psych Involvement: No (comment)  Admission diagnosis:  S/P MVR (mitral valve repair) [Z98.890] Patient Active Problem List   Diagnosis Date Noted   S/P MVR (mitral valve repair) 05/27/2023   Nonrheumatic mitral valve regurgitation 02/07/2023   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   Body mass index (BMI) 29.0-29.9, adult 12/15/2020   Sinus node dysfunction (HCC) 06/19/2020   Hypertension 06/19/2020   Glaucoma 04/17/2016   DYSLIPIDEMIA 08/03/2009   ATRIAL FIBRILLATION 08/03/2009   Transient cerebral ischemia 08/03/2009   ATRIAL FLUTTER 06/17/2008   PCP:  [name], MD Pharmacy:   [facility] [address], [place] - [address] [place] [address] Phone: [phone] Fax: [phone]   Social Drivers of Health (SDOH) Social History: SDOH Screenings   Food Insecurity: No Food Insecurity (05/28/2023)  Housing: Unknown (05/28/2023)  Transportation Needs: No Transportation Needs (05/28/2023)  Utilities: Not At Risk (05/28/2023)  Alcohol Screen: Low Risk  (10/31/2022)  Depression (PHQ2-9): Low Risk  (04/07/2023)  Financial Resource Strain: Low Risk  (10/31/2022)  Physical Activity: Insufficiently Active (10/31/2022)  Social Connections: Socially Integrated (05/28/2023)  Stress: No Stress Concern Present (10/31/2022)  Tobacco Use: Low Risk  (05/27/2023)  Health Literacy: Adequate Health Literacy (10/31/2022)    Readmission Risk Interventions     No data  to display

## 2023-05-28 NOTE — Anesthesia Postprocedure Evaluation (Signed)
Anesthesia Post Note  Patient: [name] [name]  Procedure(s) Performed: MITRAL VALVE REPAIR USING MEDTRONIC SIMULUS SEMI-RIDGID ANNULOPLASTY BAND (MVR) (Chest) TRICUSPID VALVE REPAIR USING EDWARDS MC3 TRICUSPID ANNULOPLASTY RING (Chest) MAZE (Chest) TRANSESOPHAGEAL ECHOCARDIOGRAM (TEE)     Patient location during evaluation: SICU Anesthesia Type: General Level of consciousness: sedated Pain management: pain level controlled Vital Signs Assessment: post-procedure vital signs reviewed and stable Respiratory status: patient remains intubated per anesthesia plan Cardiovascular status: stable Postop Assessment: no apparent nausea or vomiting Anesthetic complications: no   No notable events documented.  Last Vitals:  Vitals:   05/28/23 1400 05/28/23 1415  BP: 92/66 103/68  Pulse: 80 80  Resp: (!) 7 13  Temp:    SpO2: 93% 93%    Last Pain:  Vitals:   05/28/23 1200  TempSrc:   PainSc: 0-No pain                 Earl Lites P Jaia Alonge

## 2023-05-28 NOTE — Progress Notes (Addendum)
TCTS DAILY ICU PROGRESS NOTE                   [address]            [place] [address]          [phone]   1 Day Post-Op Procedure(s) (LRB): MITRAL VALVE REPAIR USING MEDTRONIC SIMULUS SEMI-RIDGID ANNULOPLASTY BAND (MVR) (N/A) TRICUSPID VALVE REPAIR USING EDWARDS MC3 TRICUSPID ANNULOPLASTY RING (N/A) MAZE (N/A) TRANSESOPHAGEAL ECHOCARDIOGRAM (TEE) (N/A)  Total Length of Stay:  LOS: 1 day   Subjective: Some incis discomfort  Objective: Vital signs in last 24 hours: Temp:  [96.8 F (36 C)-99.3 F (37.4 C)] 99.1 F (37.3 C) (02/19 0630) Pulse Rate:  [65-119] 88 (02/19 0630) Cardiac Rhythm: Atrial paced (02/19 0100) Resp:  [0-31] 26 (02/19 0630) BP: (89-128)/(56-89) 123/79 (02/19 0600) SpO2:  [90 %-98 %] 95 % (02/19 0630) Arterial Line BP: (81-133)/(45-75) 119/75 (02/19 0630) FiO2 (%):  [40 %-50 %] 40 % (02/18 1723) Weight:  [98 kg] 98 kg (02/19 0500)  Filed Weights   05/27/23 0606 05/28/23 0500  Weight: 93 kg 98 kg    Weight change: 5.013 kg   Hemodynamic parameters for last 24 hours: PAP: (18-45)/(8-38) 40/33 CVP:  [10 mmHg-21 mmHg] 17 mmHg CO:  [3.3 L/min-6.6 L/min] 5.8 L/min CI:  [1.6 L/min/m2-3.1 L/min/m2] 2.8 L/min/m2  Intake/Output from previous day: 02/18 0701 - 02/19 0700 In: 6082.7 [P.O.:240; I.V.:3773.2; Blood:450; IV Piggyback:1619.5] Out: 3990 [Urine:2660; Blood:800; Chest Tube:530]  Intake/Output this shift: No intake/output data recorded.  Current Meds: Scheduled Meds:  acetaminophen  1,000 mg Oral Q6H   Or   acetaminophen (TYLENOL) oral liquid 160 mg/5 mL  1,000 mg Per Tube Q6H   aspirin EC  325 mg Oral Daily   Or   aspirin  324 mg Per Tube Daily   bisacodyl  10 mg Oral Daily   Or   bisacodyl  10 mg Rectal Daily   Chlorhexidine Gluconate Cloth  6 each Topical Daily   docusate sodium  200 mg Oral Daily   metoCLOPramide (REGLAN) injection  10 mg Intravenous Q6H   metoprolol tartrate  12.5 mg Oral BID   Or    metoprolol tartrate  12.5 mg Per Tube BID   mouth rinse  15 mL Mouth Rinse Q2H   [START ON 05/29/2023] pantoprazole  40 mg Oral Daily   pantoprazole (PROTONIX) IV  40 mg Intravenous QHS   sodium chloride flush  3 mL Intravenous Q12H   sodium chloride flush  3-10 mL Intravenous Q12H   Continuous Infusions:  sodium chloride Stopped (05/27/23 1948)   sodium chloride     albumin human 200 mL/hr at 05/28/23 0600   amiodarone 60 mg/hr (05/28/23 0600)   amiodarone      ceFAZolin (ANCEF) IV 2 g (05/28/23 0636)   dexmedetomidine (PRECEDEX) IV infusion Stopped (05/27/23 1457)   epinephrine 4 mcg/min (05/28/23 0600)   insulin 1.9 Units/hr (05/28/23 0600)   lactated ringers     lactated ringers 20 mL/hr at 05/28/23 0600   niCARDipine     norepinephrine (LEVOPHED) Adult infusion Stopped (05/28/23 0100)   phenylephrine (NEO-SYNEPHRINE) Adult infusion Stopped (05/27/23 1422)   vasopressin 0.04 Units/min (05/28/23 0600)   PRN Meds:.sodium chloride, albumin human, dextrose, metoprolol tartrate, midazolam, morphine injection, ondansetron (ZOFRAN) IV, mouth rinse, mouth rinse, oxyCODONE, sodium chloride flush, sodium chloride flush, traMADol  General appearance: alert, cooperative, and no distress Heart: regular rate and rhythm and + rub  w/chest tubes in place Lungs: mildly dim in bases Abdomen: benign Extremities: minimal edema Wound: dressings intact  Lab Results: CBC: Recent Labs    05/27/23 1811 05/27/23 1859 05/28/23 0115 05/28/23 0310  WBC 16.3*  --   --  14.3*  HGB 11.3*   < > 10.2* 10.4*  HCT 34.5*   < > 30.0* 32.0*  PLT 128*  --   --  110*   < > = values in this interval not displayed.   BMET:  Recent Labs    05/27/23 2044 05/28/23 0115 05/28/23 0310  NA 137 140 136  K 4.0 4.4 4.1  CL 106 103 106  CO2 20*  --  20*  GLUCOSE 220* 139* 138*  BUN 14 17 14   CREATININE 1.11 0.90 1.08  CALCIUM 7.5*  --  7.8*    CMET: Lab Results  Component Value Date   WBC 14.3 (H)  05/28/2023   HGB 10.4 (L) 05/28/2023   HCT 32.0 (L) 05/28/2023   PLT 110 (L) 05/28/2023   GLUCOSE 138 (H) 05/28/2023   CHOL 111 12/06/2022   TRIG 98 12/06/2022   HDL 28 (L) 12/06/2022   LDLCALC 65 12/06/2022   ALT 20 05/23/2023   AST 23 05/23/2023   NA 136 05/28/2023   K 4.1 05/28/2023   CL 106 05/28/2023   CREATININE 1.08 05/28/2023   BUN 14 05/28/2023   CO2 20 (L) 05/28/2023   TSH 1.83 04/07/2023   PSA <0.04 08/13/2021   INR 1.7 (H) 05/27/2023   HGBA1C 5.8 (H) 05/23/2023      PT/INR:  Recent Labs    05/27/23 1318  LABPROT 19.8*  INR 1.7*   Radiology: [facility] Chest Port 1 View Result Date: 05/27/2023 CLINICAL DATA:  Status post mitral valve repair. EXAM: PORTABLE CHEST 1 VIEW COMPARISON:  Chest radiograph dated 05/23/2023. FINDINGS: Endotracheal tube approximately 4.5 cm above the carina. Enteric tube extends below the diaphragm with tip beyond the inferior margin of the image. Right IJ Swan-Ganz catheter with tip projecting over the left hilum, likely in the right pulmonary artery. Bilateral streaky atelectasis. Consolidative changes of the left lung base may represent atelectasis. Pneumonia is not excluded no pneumothorax. Stable cardiomegaly. Cardiac valve repair. No acute osseous pathology. IMPRESSION: 1. Support apparatus as above. 2. Bilateral atelectasis. Electronically Signed   By: Elgie Collard M.D.   On: 05/27/2023 15:41     Assessment/Plan: S/P Procedure(s) (LRB): MITRAL VALVE REPAIR USING MEDTRONIC SIMULUS SEMI-RIDGID ANNULOPLASTY BAND (MVR) (N/A) TRICUSPID VALVE REPAIR USING EDWARDS MC3 TRICUSPID ANNULOPLASTY RING (N/A) MAZE (N/A) TRANSESOPHAGEAL ECHOCARDIOGRAM (TEE) (N/A) POD#1  1 Tmax 99.3, s BP 89-128, good hemodynamics on vasopressin, amio- will turn epi off w/ tachy , long pause w/pacer off, VVI at 80 for now 2 O2 sats good on 4 liters,  3 good UOP, weight up 5 KG, will need diuresis 4 CT 530 ml- keep in place 5 BS adeq control, usual  protocols 6 normal renal fxn, has received bicarb for acidosis 7 reactive leukocytosis , trending lower 8 expected ABLA- mild, monitor clinically 9 CXR - some atx 10 routine progression orders    Rowe Clack PA-C Pager [phone] 05/28/2023 7:17 AM  Agree with above Underlying rhythm appears junctional but has had acccelerated rates overnight. Continue amiodarone but change to po Diurese Remove swan and aline Will use eliquis for anticoagulation after a few days when wires removed

## 2023-05-28 NOTE — Progress Notes (Signed)
NAME:  [name] [name], MRN:  [id number], DOB:  01-15-1946, LOS: 1 ADMISSION DATE:  05/27/2023, CONSULTATION DATE:  2/18 REFERRING MD:  Leafy Ro, CHIEF COMPLAINT:  post op critical care support    History of Present Illness:  78 year old male w/ medical hx as outlined below. Followed by both cards and CVTS for mod/svr MR w/ progressive fatigue. Presented to [facility] 2/18 for elective MR/TR and MAZE Intra-op echo 45-50% EF, LV global hypokinesis, mildly elevated PAS,  RV mildly enlarged.  Bypass started 0852 ended 1118  cell saver  To CVICU post op  PCCM asked to assist w/ post op care  Pertinent  Medical History  Severe MR, mod TR, HFmrEF (EF 55%), Dysrhythmia, HL, PAF s/p ablation 2012 (on xarelto), Prior CVA,chronic UTIs, prostate cancer s/p prostatectomy  Significant Hospital Events: Including procedures, antibiotic start and stop dates in addition to other pertinent events    2/18 MAZE, MV repair, TV repair, extubated overnight  Interim History / Subjective:  This morning he complains of pain. On low dose vaso & epi this morning.  A-fib with RVR overnight requiring boluses of amiodarone.CI ~2.3 this morning.   Objective   Blood pressure 123/79, pulse 88, temperature 99.1 F (37.3 C), resp. rate (!) 26, height 5\' 10"  (1.778 m), weight 98 kg, SpO2 95%. PAP: (30-45)/(20-38) 40/33 CVP:  [10 mmHg-21 mmHg] 17 mmHg CO:  [3.3 L/min-6.6 L/min] 5.8 L/min CI:  [1.6 L/min/m2-3.1 L/min/m2] 2.8 L/min/m2  Vent Mode: PSV;CPAP FiO2 (%):  [40 %-50 %] 40 % Set Rate:  [4 bmp-18 bmp] 4 bmp Vt Set:  [580 mL] 580 mL PEEP:  [5 cmH20] 5 cmH20 Pressure Support:  [10 cmH20] 10 cmH20 Plateau Pressure:  [19 cmH20] 19 cmH20   Intake/Output Summary (Last 24 hours) at 05/28/2023 0725 Last data filed at 05/28/2023 0600 Gross per 24 hour  Intake 6082.73 ml  Output 3990 ml  Net 2092.73 ml   Filed Weights   05/27/23 0606 05/28/23 0500  Weight: 93 kg 98 kg    Examination: General: Elderly man lying in  bed no acute distress HENT: [place]/AT, eyes anicteric  lungs: Breathing comfortably on nasal cannula, reduced basilar breath sounds.  Clear anteriorly. Cardiovascular: Paced rhythm, normal chest tube output abdomen: Soft, nontender Extremities: Mild edema, no cyanosis Neuro: Awake, alert, answering questions appropriately GU: Clear yellow urine  BUN 14 Cr 1.08 WBC 14.3 H/H 10.4/32 Platelets 110 CXR personally reviewed> cardiomegaly, left effusion vs infiltrate.   Resolved Hospital Problem list     Assessment & Plan:  H/o Severe mitral regurgitation, tricuspid regurgitation, Afib, s/p MV and TV repair w/ MAZE on 2/18 -post-op care per TCTS -complete post op antibiotics -pain control per protocol- morphine, tramadol, oxycodone -aspirin, statin daily -start metoprolol once weaned off vasopressors -amiodarone; hold full AC for now -wean off epi& vaso today; hold metoprolol this morning -tele monitoring  Post-op vent management -pulmonary hygiene  Hyperglycemia; h/o pre-DM. A1c 5.8 -transition to basal bolus insulin  ABLA- expected post-op Consumptive thrombocytopenia- expected post-op Consumptive coagulopathy- expected post-op -transfuse for Hb <7 or hemodynamically significant bleeding  H/o HTN & HFrEF  -hold PTA lsoartan    Best Practice (right click and "Reselect all SmartList Selections" daily)   Diet/type: Regular consistency (see orders) DVT prophylaxis SCD Pressure ulcer(s): N/A GI prophylaxis: PPI Lines: Central line Foley:  Yes, and it is still needed Code Status:  full code Last date of multidisciplinary goals of care discussion [per primary ]  Labs  CBC: Recent Labs  Lab 05/23/23 1108 05/27/23 0756 05/27/23 1052 05/27/23 1139 05/27/23 1318 05/27/23 1757 05/27/23 1811 05/27/23 1859 05/28/23 0115 05/28/23 0310  WBC 6.3  --   --   --  17.7*  --  16.3*  --   --  14.3*  HGB 14.8   < > 10.2*   < > 12.8*  12.2* 11.6* 11.3* 11.2* 10.2* 10.4*  HCT  45.3   < > 30.0*   < > 38.6*  36.0* 34.0* 34.5* 33.0* 30.0* 32.0*  MCV 93.8  --   --   --  93.5  --  93.2  --   --  93.6  PLT 215  --  133*  --  132*  --  128*  --   --  110*   < > = values in this interval not displayed.    Basic Metabolic Panel: Recent Labs  Lab 05/23/23 1108 05/27/23 0756 05/27/23 1142 05/27/23 1318 05/27/23 1811 05/27/23 1859 05/27/23 2044 05/28/23 0115 05/28/23 0310  NA 142   < > 138   < > 148* 141 137 140 136  K 4.0   < > 4.2   < > 3.8 6.2* 4.0 4.4 4.1  CL 108   < > 103  --  110  --  106 103 106  CO2 25  --   --   --  21*  --  20*  --  20*  GLUCOSE 84   < > 161*  --  151*  --  220* 139* 138*  BUN 16   < > 18  --  14  --  14 17 14   CREATININE 1.03   < > 0.90  --  1.34*  --  1.11 0.90 1.08  CALCIUM 9.2  --   --   --  8.0*  --  7.5*  --  7.8*  MG  --   --   --   --  3.3*  --  2.7*  --  2.4   < > = values in this interval not displayed.   GFR: Estimated Creatinine Clearance: 64 mL/min (by C-G formula based on SCr of 1.08 mg/dL). Recent Labs  Lab 05/23/23 1108 05/27/23 1318 05/27/23 1811 05/28/23 0310  WBC 6.3 17.7* 16.3* 14.3*        Critical care time: 35  min      Steffanie Dunn, DO 05/28/23 6:29 PM [facility]  For contact information, see Amion. If no response to pager, please call PCCM consult pager. After hours, 7PM- 7AM, please call Elink.

## 2023-05-29 ENCOUNTER — Inpatient Hospital Stay (HOSPITAL_COMMUNITY): Payer: Medicare Other

## 2023-05-29 DIAGNOSIS — D62 Acute posthemorrhagic anemia: Secondary | ICD-10-CM | POA: Diagnosis not present

## 2023-05-29 DIAGNOSIS — I340 Nonrheumatic mitral (valve) insufficiency: Secondary | ICD-10-CM

## 2023-05-29 DIAGNOSIS — R739 Hyperglycemia, unspecified: Secondary | ICD-10-CM | POA: Diagnosis not present

## 2023-05-29 LAB — BASIC METABOLIC PANEL
Anion gap: 10 (ref 5–15)
BUN: 26 mg/dL — ABNORMAL HIGH (ref 8–23)
CO2: 22 mmol/L (ref 22–32)
Calcium: 8.1 mg/dL — ABNORMAL LOW (ref 8.9–10.3)
Chloride: 103 mmol/L (ref 98–111)
Creatinine, Ser: 1.18 mg/dL (ref 0.61–1.24)
GFR, Estimated: >60 mL/min (ref >60)
Glucose, Bld: 107 mg/dL — ABNORMAL HIGH (ref 70–99)
Potassium: 4.4 mmol/L (ref 3.5–5.1)
Sodium: 135 mmol/L (ref 135–145)

## 2023-05-29 LAB — CBC
HCT: 33.3 % — ABNORMAL LOW (ref 39.0–52.0)
Hemoglobin: 10.8 g/dL — ABNORMAL LOW (ref 13.0–17.0)
MCH: 30.6 pg (ref 26.0–34.0)
MCHC: 32.4 g/dL (ref 30.0–36.0)
MCV: 94.3 fL (ref 80.0–100.0)
Platelets: 100 10*3/uL — ABNORMAL LOW (ref 150–400)
RBC: 3.53 MIL/uL — ABNORMAL LOW (ref 4.22–5.81)
RDW: 13.7 % (ref 11.5–15.5)
WBC: 15.1 10*3/uL — ABNORMAL HIGH (ref 4.0–10.5)
nRBC: 0 % (ref 0.0–0.2)

## 2023-05-29 LAB — GLUCOSE, CAPILLARY
Glucose-Capillary: 105 mg/dL — ABNORMAL HIGH (ref 70–99)
Glucose-Capillary: 113 mg/dL — ABNORMAL HIGH (ref 70–99)
Glucose-Capillary: 113 mg/dL — ABNORMAL HIGH (ref 70–99)
Glucose-Capillary: 115 mg/dL — ABNORMAL HIGH (ref 70–99)
Glucose-Capillary: 117 mg/dL — ABNORMAL HIGH (ref 70–99)
Glucose-Capillary: 122 mg/dL — ABNORMAL HIGH (ref 70–99)
Glucose-Capillary: 129 mg/dL — ABNORMAL HIGH (ref 70–99)

## 2023-05-29 LAB — MAGNESIUM: Magnesium: 2.5 mg/dL — ABNORMAL HIGH (ref 1.7–2.4)

## 2023-05-29 MED ORDER — INSULIN ASPART 100 UNIT/ML IJ SOLN: 0.0000 [IU] | Freq: Every day | INTRAMUSCULAR | Status: DC

## 2023-05-29 MED ORDER — AMIODARONE HCL 200 MG PO TABS
400.0000 mg | ORAL_TABLET | Freq: Two times a day (BID) | ORAL | Status: DC
  Administered 2023-05-29: 400 mg via ORAL
  Filled 2023-05-29: qty 2

## 2023-05-29 MED ORDER — FUROSEMIDE 10 MG/ML IJ SOLN
40.0000 mg | Freq: Two times a day (BID) | INTRAMUSCULAR | Status: DC
  Administered 2023-05-29 – 2023-06-01 (×8): 40 mg via INTRAVENOUS
  Filled 2023-05-29 (×8): qty 4

## 2023-05-29 MED ORDER — INSULIN ASPART 100 UNIT/ML IJ SOLN: 0.0000 [IU] | Freq: Three times a day (TID) | INTRAMUSCULAR | Status: DC

## 2023-05-29 MED ORDER — FUROSEMIDE 10 MG/ML IJ SOLN: 20.0000 mg | Freq: Once | INTRAMUSCULAR | Status: DC

## 2023-05-29 MED ORDER — INSULIN ASPART 100 UNIT/ML IJ SOLN: 0.0000 [IU] | INTRAMUSCULAR | Status: DC

## 2023-05-29 NOTE — Plan of Care (Signed)
  Problem: Education: Goal: Knowledge of General Education information will improve Description: Including pain rating scale, medication(s)/side effects and non-pharmacologic comfort measures Outcome: Progressing   Problem: Health Behavior/Discharge Planning: Goal: Ability to manage health-related needs will improve Outcome: Progressing   Problem: Clinical Measurements: Goal: Ability to maintain clinical measurements within normal limits will improve Outcome: Progressing Goal: Will remain free from infection Outcome: Progressing Goal: Diagnostic test results will improve Outcome: Progressing Goal: Respiratory complications will improve Outcome: Progressing Goal: Cardiovascular complication will be avoided Outcome: Progressing   Problem: Activity: Goal: Risk for activity intolerance will decrease Outcome: Progressing   Problem: Nutrition: Goal: Adequate nutrition will be maintained Outcome: Progressing   Problem: Coping: Goal: Level of anxiety will decrease Outcome: Progressing   Problem: Elimination: Goal: Will not experience complications related to bowel motility Outcome: Progressing Goal: Will not experience complications related to urinary retention Outcome: Progressing   Problem: Pain Managment: Goal: General experience of comfort will improve and/or be controlled Outcome: Progressing   Problem: Safety: Goal: Ability to remain free from injury will improve Outcome: Progressing   Problem: Skin Integrity: Goal: Risk for impaired skin integrity will decrease Outcome: Progressing   Problem: Education: Goal: Will demonstrate proper wound care and an understanding of methods to prevent future damage Outcome: Progressing Goal: Knowledge of disease or condition will improve Outcome: Progressing Goal: Knowledge of the prescribed therapeutic regimen will improve Outcome: Progressing   Problem: Activity: Goal: Risk for activity intolerance will decrease Outcome:  Progressing   Problem: Cardiac: Goal: Will achieve and/or maintain hemodynamic stability Outcome: Progressing   Problem: Clinical Measurements: Goal: Postoperative complications will be avoided or minimized Outcome: Progressing   Problem: Respiratory: Goal: Respiratory status will improve Outcome: Progressing   Problem: Skin Integrity: Goal: Wound healing without signs and symptoms of infection Outcome: Progressing Goal: Risk for impaired skin integrity will decrease Outcome: Progressing   Problem: Urinary Elimination: Goal: Ability to achieve and maintain adequate renal perfusion and functioning will improve Outcome: Progressing

## 2023-05-29 NOTE — Progress Notes (Signed)
NAME:  [name] [name], MRN:  [id number], DOB:  05/25/1945, LOS: 2 ADMISSION DATE:  05/27/2023, CONSULTATION DATE:  2/18 REFERRING MD:  Leafy Ro, CHIEF COMPLAINT:  post op critical care support    History of Present Illness:  78 year old male w/ medical hx as outlined below. Followed by both cards and CVTS for mod/svr MR w/ progressive fatigue. Presented to [facility] 2/18 for elective MR/TR and MAZE Intra-op echo 45-50% EF, LV global hypokinesis, mildly elevated PAS,  RV mildly enlarged.  Bypass started 0852 ended 1118  cell saver  To CVICU post op  PCCM asked to assist w/ post op care  Pertinent  Medical History  Severe MR, mod TR, HFmrEF (EF 55%), Dysrhythmia, HL, PAF s/p ablation 2012 (on xarelto), Prior CVA,chronic UTIs, prostate cancer s/p prostatectomy  Significant Hospital Events: Including procedures, antibiotic start and stop dates in addition to other pertinent events    2/18 MAZE, MV repair, TV repair, extubated overnight  Interim History / Subjective:  Overnight no acute events overnight. Pain is ok. Remains on low dose phenylephrine overnight.  Objective   Blood pressure 114/64, pulse 79, temperature 99.3 F (37.4 C), resp. rate (!) 22, height 5\' 10"  (1.778 m), weight 98 kg, SpO2 94%. PAP: (39-45)/(19-21) 40/20 CVP:  [14 mmHg-18 mmHg] 14 mmHg CO:  [4 L/min] 4 L/min CI:  [1.9 L/min/m2] 1.9 L/min/m2      Intake/Output Summary (Last 24 hours) at 05/29/2023 0719 Last data filed at 05/29/2023 0700 Gross per 24 hour  Intake 564.37 ml  Output 1562 ml  Net -997.63 ml   Filed Weights   05/27/23 0606 05/28/23 0500  Weight: 93 kg 98 kg    Examination: General: elderly man sitting in the chair in NAD HENT: [place]/AT, eyes anicteric lungs: breathing comfortably on [place], rhales bilaterally Cardiovascular: S1S2, RRR, paced abdomen: soft, NT Extremities: mild edema, no cyanosis Neuro: awake, alert, moving all extremities GU: Clear yellow urine  BUN 26 Cr 1.18 WBC 15.1 H/H  10.8/33.3 Platelets 100 CXR personally reviewed> atelectasis, small left effusion,RIJ introducer  Resolved Hospital Problem list     Assessment & Plan:  H/o Severe mitral regurgitation, tricuspid regurgitation, Afib, s/p MV and TV repair w/ MAZE on 2/18 -post-op care per TCTS, switch back to A-pacing -amiodarone BID -lasix -pain control -aspirin, statin -con't chest tubes -wean off phenylephrine to maintain MAP >65 -tele monitoring -start DOAC tomorrow  Post-op vent management -pulmonary hygiene, wean O@  Hyperglycemia; h/o pre-DM. A1c 5.8 -SSI PRN TIDAC + QHS -goal BG 140-180  ABLA- expected post-op Consumptive thrombocytopenia- expected post-op Consumptive coagulopathy- expected post-op -transfuse per TCTS  H/o HTN & HFrEF  -con't holding PTA losartan, hold Bblocker    Best Practice (right click and "Reselect all SmartList Selections" daily)   Diet/type: Regular consistency (see orders) DVT prophylaxis SCD Pressure ulcer(s): N/A GI prophylaxis: PPI Lines: Central line Foley:  Yes, and it is still needed Code Status:  full code Last date of multidisciplinary goals of care discussion [per primary ]  Labs   CBC: Recent Labs  Lab 05/27/23 1318 05/27/23 1757 05/27/23 1811 05/27/23 1859 05/28/23 0115 05/28/23 0310 05/28/23 1818 05/29/23 0500  WBC 17.7*  --  16.3*  --   --  14.3* 16.4* 15.1*  HGB 12.8*  12.2*   < > 11.3* 11.2* 10.2* 10.4* 10.9* 10.8*  HCT 38.6*  36.0*   < > 34.5* 33.0* 30.0* 32.0* 33.2* 33.3*  MCV 93.5  --  93.2  --   --  93.6 94.3 94.3  PLT 132*  --  128*  --   --  110* 99* 100*   < > = values in this interval not displayed.    Basic Metabolic Panel: Recent Labs  Lab 05/27/23 1811 05/27/23 1859 05/27/23 2044 05/28/23 0115 05/28/23 0310 05/28/23 1818 05/29/23 0500  NA 148*   < > 137 140 136 136 135  K 3.8   < > 4.0 4.4 4.1 4.6 4.4  CL 110  --  106 103 106 105 103  CO2 21*  --  20*  --  20* 21* 22  GLUCOSE 151*  --  220*  139* 138* 150* 107*  BUN 14  --  14 17 14 21  26*  CREATININE 1.34*  --  1.11 0.90 1.08 1.24 1.18  CALCIUM 8.0*  --  7.5*  --  7.8* 7.9* 8.1*  MG 3.3*  --  2.7*  --  2.4 2.3 2.5*   < > = values in this interval not displayed.   GFR: Estimated Creatinine Clearance: 58.6 mL/min (by C-G formula based on SCr of 1.18 mg/dL). Recent Labs  Lab 05/27/23 1811 05/28/23 0310 05/28/23 1818 05/29/23 0500  WBC 16.3* 14.3* 16.4* 15.1*        Critical care time:  33 min.     Steffanie Dunn, DO 05/29/23 7:19 AM [facility]  For contact information, see Amion. If no response to pager, please call PCCM consult pager. After hours, 7PM- 7AM, please call Elink.

## 2023-05-29 NOTE — Progress Notes (Signed)
      [address]       [place] [address]             [phone]      POD # 2 MV repair, TV repair, Maze  Up in chair Ambulated twice today  BP 106/72   Pulse 82   Temp 99.4 F (37.4 C) (Oral)   Resp 15   Ht 5\' 10"  (1.778 m)   Wt 98 kg   SpO2 95%   BMI 31.00 kg/m   Intake/Output Summary (Last 24 hours) at 05/29/2023 1756 Last data filed at 05/29/2023 1400 Gross per 24 hour  Intake 323.19 ml  Output 1252 ml  Net -928.81 ml   CBG well controlled No PM labs  Mitsuye Schrodt C. Dorris Fetch, MD [facility] [phone]

## 2023-05-29 NOTE — Progress Notes (Addendum)
[facility] DAILY ICU PROGRESS NOTE                   [address]            [name] [address]          [phone]   2 Days Post-Op Procedure(s) (LRB): MITRAL VALVE REPAIR USING MEDTRONIC SIMULUS SEMI-RIDGID ANNULOPLASTY BAND (MVR) (N/A) TRICUSPID VALVE REPAIR USING EDWARDS MC3 TRICUSPID ANNULOPLASTY RING (N/A) MAZE (N/A) TRANSESOPHAGEAL ECHOCARDIOGRAM (TEE) (N/A)  Total Length of Stay:  LOS: 2 days   Subjective: Feels ok , no specific C/O  Objective: Vital signs in last 24 hours: Temp:  [98.3 F (36.8 C)-99.3 F (37.4 C)] 99.3 F (37.4 C) (02/20 0545) Pulse Rate:  [78-128] 79 (02/20 0545) Cardiac Rhythm: Ventricular paced (02/20 0400) Resp:  [0-26] 22 (02/20 0545) BP: (83-114)/(56-73) 114/64 (02/19 1945) SpO2:  [87 %-96 %] 94 % (02/20 0545) Arterial Line BP: (90-139)/(45-79) 102/57 (02/20 0545)  Filed Weights   05/27/23 0606 05/28/23 0500  Weight: 93 kg 98 kg    Weight change:    Hemodynamic parameters for last 24 hours: PAP: (39-45)/(19-36) 40/20 CVP:  [14 mmHg-18 mmHg] 14 mmHg CO:  [4 L/min] 4 L/min CI:  [1.9 L/min/m2] 1.9 L/min/m2  Intake/Output from previous day: 02/19 0701 - 02/20 0700 In: 564.4 [I.V.:364.4; IV Piggyback:200] Out: 1562 [Urine:962; Chest Tube:600]  Intake/Output this shift: No intake/output data recorded.  Current Meds: Scheduled Meds:  acetaminophen  1,000 mg Oral Q6H   Or   acetaminophen (TYLENOL) oral liquid 160 mg/5 mL  1,000 mg Per Tube Q6H   amiodarone  400 mg Oral TID   aspirin EC  325 mg Oral Daily   Or   aspirin  324 mg Per Tube Daily   bisacodyl  10 mg Oral Daily   Or   bisacodyl  10 mg Rectal Daily   Chlorhexidine Gluconate Cloth  6 each Topical Daily   docusate sodium  200 mg Oral Daily   insulin aspart  0-24 Units Subcutaneous Q4H   metoprolol tartrate  12.5 mg Oral BID   Or   metoprolol tartrate  12.5 mg Per Tube BID   pantoprazole  40 mg Oral Daily   sodium chloride flush  3 mL  Intravenous Q12H   sodium chloride flush  3-10 mL Intravenous Q12H   Continuous Infusions:  albumin human Stopped (05/28/23 0700)   niCARDipine     norepinephrine (LEVOPHED) Adult infusion Stopped (05/28/23 0100)   phenylephrine (NEO-SYNEPHRINE) Adult infusion 5 mcg/min (05/28/23 1900)   vasopressin Stopped (05/28/23 1016)   PRN Meds:.albumin human, metoprolol tartrate, morphine injection, ondansetron (ZOFRAN) IV, mouth rinse, mouth rinse, oxyCODONE, sodium chloride flush, sodium chloride flush, traMADol  General appearance: alert, cooperative, fatigued, and no distress Heart: regular rate and rhythm Lungs: dim in bases Abdomen: benign Extremities: minor LE edema Wound: incis dressings intact  Lab Results: CBC: Recent Labs    05/28/23 1818 05/29/23 0500  WBC 16.4* 15.1*  HGB 10.9* 10.8*  HCT 33.2* 33.3*  PLT 99* 100*   BMET:  Recent Labs    05/28/23 1818 05/29/23 0500  NA 136 135  K 4.6 4.4  CL 105 103  CO2 21* 22  GLUCOSE 150* 107*  BUN 21 26*  CREATININE 1.24 1.18  CALCIUM 7.9* 8.1*    CMET: Lab Results  Component Value Date   WBC 15.1 (H) 05/29/2023   HGB 10.8 (L) 05/29/2023   HCT 33.3 (L) 05/29/2023  PLT 100 (L) 05/29/2023   GLUCOSE 107 (H) 05/29/2023   CHOL 111 12/06/2022   TRIG 98 12/06/2022   HDL 28 (L) 12/06/2022   LDLCALC 65 12/06/2022   ALT 20 05/23/2023   AST 23 05/23/2023   NA 135 05/29/2023   K 4.4 05/29/2023   CL 103 05/29/2023   CREATININE 1.18 05/29/2023   BUN 26 (H) 05/29/2023   CO2 22 05/29/2023   TSH 1.83 04/07/2023   PSA <0.04 08/13/2021   INR 1.7 (H) 05/27/2023   HGBA1C 5.8 (H) 05/23/2023      PT/INR:  Recent Labs    05/27/23 1318  LABPROT 19.8*  INR 1.7*   Radiology: No results found.   Assessment/Plan: S/P Procedure(s) (LRB): MITRAL VALVE REPAIR USING MEDTRONIC SIMULUS SEMI-RIDGID ANNULOPLASTY BAND (MVR) (N/A) TRICUSPID VALVE REPAIR USING EDWARDS MC3 TRICUSPID ANNULOPLASTY RING (N/A) MAZE  (N/A) TRANSESOPHAGEAL ECHOCARDIOGRAM (TEE) (N/A) POD#2  1 Tmax 99.3, BP 80's-110's, Vpaced,junct  brady underneath, on neo gtt 2 O2 sats ok on 4 liters 3 fair UOP, weight pending, cont diuresis 4 CT 600 ml/24 h- keep in place 5 BS good control on SSI 6 BUN up slightly at 26, creat is normal 1.18 7 leukocytosis is fairly stable, WBC 15.1- likely reactive 8 thrombocytopenia is stable , PLT 100K 9 expected ABLA is stable , H/H 10/33 10 CXR, bibasilar atx, small effusions 11 pulm hygiene and rehab modalities  Rowe Clack PA-C Pager [phone] 05/29/2023 7:09 AM  Agree with above Diuresis Wean neo Leave chest tubes Start eliquis tomorrow

## 2023-05-30 ENCOUNTER — Other Ambulatory Visit (HOSPITAL_COMMUNITY): Payer: Self-pay

## 2023-05-30 ENCOUNTER — Inpatient Hospital Stay (HOSPITAL_COMMUNITY): Payer: Medicare Other

## 2023-05-30 DIAGNOSIS — D62 Acute posthemorrhagic anemia: Secondary | ICD-10-CM | POA: Diagnosis not present

## 2023-05-30 DIAGNOSIS — I340 Nonrheumatic mitral (valve) insufficiency: Secondary | ICD-10-CM | POA: Diagnosis not present

## 2023-05-30 DIAGNOSIS — Z9889 Other specified postprocedural states: Secondary | ICD-10-CM | POA: Diagnosis not present

## 2023-05-30 DIAGNOSIS — I517 Cardiomegaly: Secondary | ICD-10-CM | POA: Diagnosis not present

## 2023-05-30 DIAGNOSIS — R001 Bradycardia, unspecified: Secondary | ICD-10-CM | POA: Diagnosis not present

## 2023-05-30 DIAGNOSIS — R739 Hyperglycemia, unspecified: Secondary | ICD-10-CM | POA: Diagnosis not present

## 2023-05-30 LAB — BASIC METABOLIC PANEL
Anion gap: 13 (ref 5–15)
BUN: 37 mg/dL — ABNORMAL HIGH (ref 8–23)
CO2: 23 mmol/L (ref 22–32)
Calcium: 8 mg/dL — ABNORMAL LOW (ref 8.9–10.3)
Chloride: 99 mmol/L (ref 98–111)
Creatinine, Ser: 1.47 mg/dL — ABNORMAL HIGH (ref 0.61–1.24)
GFR, Estimated: 48 mL/min — ABNORMAL LOW (ref >60)
Glucose, Bld: 145 mg/dL — ABNORMAL HIGH (ref 70–99)
Potassium: 3.8 mmol/L (ref 3.5–5.1)
Sodium: 135 mmol/L (ref 135–145)

## 2023-05-30 LAB — ECHOCARDIOGRAM LIMITED
Height: 70 in
S' Lateral: 3.7 cm
Weight: 3470.92 [oz_av]

## 2023-05-30 LAB — CBC
HCT: 33.2 % — ABNORMAL LOW (ref 39.0–52.0)
Hemoglobin: 10.5 g/dL — ABNORMAL LOW (ref 13.0–17.0)
MCH: 29.9 pg (ref 26.0–34.0)
MCHC: 31.6 g/dL (ref 30.0–36.0)
MCV: 94.6 fL (ref 80.0–100.0)
Platelets: 105 10*3/uL — ABNORMAL LOW (ref 150–400)
RBC: 3.51 MIL/uL — ABNORMAL LOW (ref 4.22–5.81)
RDW: 13.6 % (ref 11.5–15.5)
WBC: 10.8 10*3/uL — ABNORMAL HIGH (ref 4.0–10.5)
nRBC: 0 % (ref 0.0–0.2)

## 2023-05-30 LAB — GLUCOSE, CAPILLARY
Glucose-Capillary: 89 mg/dL (ref 70–99)
Glucose-Capillary: 109 mg/dL — ABNORMAL HIGH (ref 70–99)
Glucose-Capillary: 87 mg/dL (ref 70–99)
Glucose-Capillary: 111 mg/dL — ABNORMAL HIGH (ref 70–99)

## 2023-05-30 LAB — COOXEMETRY PANEL
Carboxyhemoglobin: 2.7 % — ABNORMAL HIGH (ref 0.5–1.5)
Methemoglobin: <0.7 % (ref 0.0–1.5)
O2 Saturation: 68.3 %
Total hemoglobin: 10.7 g/dL — ABNORMAL LOW (ref 12.0–16.0)

## 2023-05-30 MED ORDER — POTASSIUM CHLORIDE CRYS ER 20 MEQ PO TBCR
40.0000 meq | EXTENDED_RELEASE_TABLET | ORAL | Status: AC
  Administered 2023-05-30 (×2): 40 meq via ORAL
  Filled 2023-05-30 (×2): qty 2

## 2023-05-30 MED ORDER — APIXABAN 5 MG PO TABS: 5.0000 mg | ORAL_TABLET | Freq: Two times a day (BID) | ORAL | Status: DC

## 2023-05-30 MED ORDER — ASPIRIN 81 MG PO CHEW
81.0000 mg | CHEWABLE_TABLET | Freq: Every day | ORAL | Status: DC
  Administered 2023-05-30 – 2023-06-09 (×11): 81 mg via ORAL
  Filled 2023-05-30 (×11): qty 1

## 2023-05-30 MED ORDER — ENSURE ENLIVE PO LIQD
237.0000 mL | Freq: Two times a day (BID) | ORAL | Status: DC
  Administered 2023-05-30 – 2023-06-05 (×10): 237 mL via ORAL

## 2023-05-30 MED ORDER — METOLAZONE 5 MG PO TABS
5.0000 mg | ORAL_TABLET | Freq: Once | ORAL | Status: AC
  Administered 2023-05-30: 5 mg via ORAL
  Filled 2023-05-30: qty 1

## 2023-05-30 MED ORDER — POTASSIUM CHLORIDE CRYS ER 20 MEQ PO TBCR
20.0000 meq | EXTENDED_RELEASE_TABLET | ORAL | Status: DC
  Administered 2023-05-30: 20 meq via ORAL
  Filled 2023-05-30: qty 1

## 2023-05-30 MED ORDER — APIXABAN 5 MG PO TABS
5.0000 mg | ORAL_TABLET | Freq: Two times a day (BID) | ORAL | Status: DC
  Administered 2023-05-30 (×2): 5 mg via ORAL
  Filled 2023-05-30 (×3): qty 1

## 2023-05-30 NOTE — Discharge Instructions (Addendum)
 Discharge Instructions:  1. You may shower, please wash incisions daily with soap and water and keep dry.  If you wish to cover wounds with dressing you may do so but please keep clean and change daily.  No tub baths or swimming until incisions have completely healed.  If your incisions become red or develop any drainage please call our office at [phone]  2. No Driving until cleared by Dr. Karolee Ohs office and you are no longer using narcotic pain medications  3. Monitor your weight daily.. Please use the same scale and weigh at same time... If you gain 5-10 lbs in 48 hours with associated lower extremity swelling, please contact our office at [phone]  4. Fever of 101.5 for at least 24 hours with no source, please contact our office at [phone]  5. Activity- up as tolerated, please walk at least 3 times per day.  Avoid strenuous activity, no lifting, pushing, or pulling with your arms over 8-10 lbs for a minimum of 6 weeks  6. If any questions or concerns arise, please do not hesitate to contact our office at [phone]   Information on my medicine - ELIQUIS (apixaban)  This medication education was reviewed with me or my healthcare representative as part of my discharge preparation.  The pharmacist that spoke with me during my hospital stay was:  Mosetta Anis, [facility]  Why was Eliquis prescribed for you? Eliquis was prescribed for you to reduce the risk of a blood clot forming that can cause a stroke if you have a medical condition called atrial fibrillation (a type of irregular heartbeat).  What do You need to know about Eliquis ? Take your Eliquis TWICE DAILY - one tablet in the morning and one tablet in the evening with or without food. If you have difficulty swallowing the tablet whole please discuss with your pharmacist how to take the medication safely.  Take Eliquis exactly as prescribed by your doctor and DO NOT stop taking Eliquis without talking to the  doctor who prescribed the medication.  Stopping may increase your risk of developing a stroke.  Refill your prescription before you run out.  After discharge, you should have regular check-up appointments with your healthcare provider that is prescribing your Eliquis.  In the future your dose may need to be changed if your kidney function or weight changes by a significant amount or as you get older.  What do you do if you miss a dose? If you miss a dose, take it as soon as you remember on the same day and resume taking twice daily.  Do not take more than one dose of ELIQUIS at the same time to make up a missed dose.  Important Safety Information A possible side effect of Eliquis is bleeding. You should call your healthcare provider right away if you experience any of the following: Bleeding from an injury or your nose that does not stop. Unusual colored urine (red or dark brown) or unusual colored stools (red or black). Unusual bruising for unknown reasons. A serious fall or if you hit your head (even if there is no bleeding).  Some medicines may interact with Eliquis and might increase your risk of bleeding or clotting while on Eliquis. To help avoid this, consult your healthcare provider or pharmacist prior to using any new prescription or non-prescription medications, including herbals, vitamins, non-steroidal anti-inflammatory drugs (NSAIDs) and supplements.  This website has more information on Eliquis (apixaban): http://www.eliquis.com/eliquis/home  After Your Pacemaker   You  have a [employer] Pacemaker  ACTIVITY Do not lift your arm above shoulder height for 1 week after your procedure. After 7 days, you may progress as below.  You should remove your sling 24 hours after your procedure, unless otherwise instructed by your provider.     Tuesday June 10, 2023  Wednesday June 11, 2023 Thursday June 12, 2023 Friday June 13, 2023   Do not lift, push, pull, or carry anything over 10  pounds with the affected arm until 6 weeks (Tuesday July 15, 2023 ) after your procedure.   You may drive AFTER your wound check, unless you have been told otherwise by your provider.   Ask your healthcare provider when you can go back to work   INCISION/Dressing If you are on a blood thinner such as Coumadin, Xarelto, Eliquis, Plavix, or Pradaxa please confirm with your provider when this should be resumed.   If large square, outer bandage is left in place, this can be removed after 24 hours from your procedure. Do not remove steri-strips or glue as below.   If a PRESSURE DRESSING (a bulky dressing that usually goes up over your shoulder) was applied or left in place, please follow instructions given by your provider on when to return to have this removed.   Monitor your Pacemaker site for redness, swelling, and drainage. Call the device clinic at [phone] if you experience these symptoms or fever/chills.  If your incision is sealed with Steri-strips or staples, you may shower 7 days after your procedure or when told by your provider. Do not remove the steri-strips or let the shower hit directly on your site. You may wash around your site with soap and water.    If you were discharged in a sling, please do not wear this during the day more than 48 hours after your surgery unless otherwise instructed. This may increase the risk of stiffness and soreness in your shoulder.   Avoid lotions, ointments, or perfumes over your incision until it is well-healed.  You may use a hot tub or a pool AFTER your wound check appointment if the incision is completely closed.  Pacemaker Alerts:  Some alerts are vibratory and others beep. These are NOT emergencies. Please call our office to let us know. If this occurs at night or on weekends, it can wait until the next business day. Send a remote transmission.  If your device is capable of reading fluid status (for heart failure), you will be offered  monthly monitoring to review this with you.   DEVICE MANAGEMENT Remote monitoring is used to monitor your pacemaker from home. This monitoring is scheduled every 91 days by our office. It allows [place] to keep an eye on the functioning of your device to ensure it is working properly. You will routinely see your Electrophysiologist annually (more often if necessary).   You should receive your ID card for your new device in 4-8 weeks. Keep this card with you at all times once received. Consider wearing a medical alert bracelet or necklace.  Your Pacemaker may be MRI compatible. This will be discussed at your next office visit/wound check.  You should avoid contact with strong electric or magnetic fields.   Do not use amateur (ham) radio equipment or electric (arc) welding torches. MP3 player headphones with magnets should not be used. Some devices are safe to use if held at least 12 inches (30 cm) from your Pacemaker. These include power tools, lawn mowers, and speakers. If you  are unsure if something is safe to use, ask your health care provider.  When using your cell phone, hold it to the ear that is on the opposite side from the Pacemaker. Do not leave your cell phone in a pocket over the Pacemaker.  You may safely use electric blankets, heating pads, computers, and microwave ovens.  Call the office right away if: You have chest pain. You feel more short of breath than you have felt before. You feel more light-headed than you have felt before. Your incision starts to open up.  This information is not intended to replace advice given to you by your health care provider. Make sure you discuss any questions you have with your health care provider.

## 2023-05-30 NOTE — Discharge Summary (Incomplete)
 [address]       [place] [address]             [phone]    Physician Discharge Summary  Patient ID: [name] [name] MRN: [id number] DOB/AGE: 1945/10/10 78 y.o.  Admit date: 05/27/2023 Discharge date: 06/18/2023  Admission Diagnoses:  Patient Active Problem List   Diagnosis Date Noted   History of colonic polyps 06/15/2023   Ischemic colitis (HCC) 06/14/2023   Benign neoplasm of colon 06/14/2023   Tricuspid valve insufficiency 06/12/2023   Hemorrhagic shock (HCC) 06/12/2023   Hematochezia 06/11/2023   Lower GI bleed 06/11/2023   Acute GI bleeding 06/10/2023   Leukocytosis 06/05/2023   ABLA (acute blood loss anemia) 06/05/2023   Acute HFrEF (heart failure with reduced ejection fraction) (HCC) 06/03/2023   Hyponatremia 06/03/2023   Atrial fibrillation with RVR (HCC) 06/03/2023   S/P MVR (mitral valve repair) 05/27/2023   Nonrheumatic mitral valve regurgitation 02/07/2023   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   Body mass index (BMI) 29.0-29.9, adult 12/15/2020   Sinus node dysfunction (HCC) 06/19/2020   Hypertension 06/19/2020   Glaucoma 04/17/2016   DYSLIPIDEMIA 08/03/2009   ATRIAL FIBRILLATION 08/03/2009   Transient cerebral ischemia 08/03/2009   ATRIAL FLUTTER 06/17/2008     Discharge Diagnoses:  Patient Active Problem List   Diagnosis Date Noted   History of colonic polyps 06/15/2023   Ischemic colitis (HCC) 06/14/2023   Benign neoplasm of colon 06/14/2023   Tricuspid valve insufficiency 06/12/2023   Hemorrhagic shock (HCC) 06/12/2023   Hematochezia 06/11/2023   Lower GI bleed 06/11/2023   Acute GI bleeding 06/10/2023   Leukocytosis 06/05/2023   ABLA (acute blood loss anemia) 06/05/2023   Acute HFrEF (heart failure with reduced ejection fraction) (HCC) 06/03/2023   Hyponatremia 06/03/2023   Atrial fibrillation with RVR (HCC) 06/03/2023   S/P MVR (mitral valve repair) 05/27/2023   Nonrheumatic mitral valve regurgitation 02/07/2023    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 Leafy Ro for symptomatic severe mitral regurgitation,  tricuspid regurgitation and atrial fibrillation.  He was originally being considered for MitraClip therapy but ultimately decision was made to proceed with surgical intervention.  Hospital course: The patient was admitted electively on 05/27/2023 and taken to the operating room at which time he underwent a mitral valve repair using 32 Medtronic stimulus semirigid annuloplasty band, tricuspid valve repair using 34 Edwards MC 3 tricuspid annuloplasty ring, and full maze procedure.  Possibly a left atrial appendage clip was placed.  He tolerated the procedure well and was taken to the surgical intensive care unit in stable condition.  Postoperative hospital course:  Patient was extubated without difficulty using standard post cardiac surgical protocols.  He has remained hemodynamically stable and inotropes/vasopressors have been weaned off.  He has required pacemaker backup for tachy/bradycardia syndrome including atrial fibrillation with RVR and EP has been consulted to assist with management.  He does have expected volume overload and has been diuresed appropriately.  He is felt to be related to surgery and not clinically relevant or related to CHF.  Renal function has  been monitored closely and there was some elevation noted to a creatinine of 1.47 on postop day 3.  Chest tubes have had moderate drainage and were removed on postop day 3.  By postop day 4, he was back in rate controlled atrial  fibrillation the pacemaker was placed in VVI backup mode.  The EP team recommended starting low-dose IV Cardizem for rate control.  Amiodarone and beta blockers were held per EP recommendations.  He has an expected acute blood loss anemia and values have been very stable.  Most recent H/H is 11.9 and 35.9.  He was continued to be monitored closely by EP and ultimately was determined and attempted DC cardioversion should be done and this was performed on 06/03/2023.  Due to the tacky bradycardia nature of his dysrhythmias and the fact that he remained bradycardic requiring VVI pacer backup it was also determined that this should proceed with permanent pacemaker and this was also done on 06/03/2023.  He has subsequently developed atrial fibrillation with RVR and is being treated with amiodarone drip and beta-blockers.  Eliquis has also been initiated.  He will likely require short-term skilled nursing facility for ongoing rehabilitation and evaluation is in place by physical and occupational therapy as well as assistance from [facility].  He was stable for transfer to the progressive care unit on 3/1.  He developed abdominal distention consistent with post operative ileus per KUB.  He was kept on a liquid diet.  He was give lactulose and suppository with good results.  His diet was advanced as he tolerated.   His a fib rate was increased on 03/03. He was given IV Amiodarone bolus and his Amiodarone and Toprol XL early. Nurse then notified me that patient's systolic BP is now in the 80's and per nurse, she already gave both Lasix and Spironolactone. She also reported that patient had about 10 ml of blood when he had a bowel movement. Per patient, he does not have a history of hemorrhoids (per nurse, he has hemorrhoids) and he is on Protonix. His H and H yesterday was stable at 11.9 and 35.9. He is not on oral iron. Will give Albumin for low BP, check CBC this am, and stop Lasix.  I spoke with EP PA on call about a fib with  RVR.  Amiodarone drip was ordered and not started until 03/03 . I stopped Toprol XL and Spironolactone as well because of persistent hypotension.  Patient had a lot more bleeding from rectum. Patient made NPO.  Apixaban was stopped as well as aspirin. Because of persistent hypotension and acute GI bleed likely lower), will transfer back to [facility] ICU. Stat CBC showed H and H result showed decreased to 9.7 and 29.2. GI was consulted. He was given multiple PRBCs, put on IV bid Protonix and Levophed drip. He developed AKI. Creatinine went as high as 2.57 . He had leukocytosis as well. WBC went from 15,800 on 03/02 to 24,600 03/04. He was put on Zosyn;? ischemic colitis or diverticulitis. A line and femoral lines were removed on 03/06. PICC line was placed as has poor peripheral access. His leukocytosis did resolve as WBC was down to 10,200 on 03/07. Zosyn was finished on 03/09. His AKI resolved as well as creatinine down to 1.23 on 03/07. He was transfused 2 more units on this day as he had further lower GI bleeding. He remained on an Amiodarone drip for a fib with RVR. GI recommended colonoscopy which showed likely ischemic colitis as the  cause of his GI bleed as well as a large lesion that was biopsied and would need to be removed as an outpatient. His GI bleed resolved, anemia improved and he began tolerating a heart healthy diet. He remained in atrial fibrillation with controlled rates, IV amiodarone was transitioned to PO Amiodarone. He was stable for transfer from the ICU to 4E for further convalescence on 03/08. Per discussion with Dr. Leafy Ro, patient was put on ec asa 81 mg daily, Digoxin 0.25 mg , and Trinsicon daily. Digoxin was decreased to 0.125 mg daily after discussion with pharmacy. He remained in rate controlled atrial fibrillation. Low dose Apixaban (2.5 mg) was started on 03/11. H and H remained (10.9 and 33.7) stable . Accu checks and SS PRN were stopped (pre diabetes). Biopsy result showed hepatic  flexure biopsy negative for dysplasia or malignancy and distal transverse colon biopsy tubular adenoma (negative for high grade dysplasia). I have spoken with GI app on call and she  will arrange for follow up with GI as an outpatient. PICC line will be removed 03/12. PT/OT were re consulted as he was very deconditioned. Insurance authorization started for [employer] and has been authorized. To CIR today.  Consults: pulmonary/intensive care and EP , GI  Significant Diagnostic Studies:   Narrative & Impression  CLINICAL DATA:  [id number] S/P MVR (mitral valve repair) [id number]. Shortness of breath. Chest pain.   EXAM: CHEST - 2 VIEW   COMPARISON:  06/12/2023.   FINDINGS: Bilateral lung fields are clear. No acute consolidation or lung collapse. There are probable left retrocardiac atelectatic changes. Bilateral costophrenic angles are clear.   Stable mildly enlarged cardio-mediastinal silhouette. Left-sided ICD device noted. Left atrial appendage closure device and prosthetic tricuspid/mitral valves noted.   No acute osseous abnormalities.   The soft tissues are within normal limits.   Lower cervical spinal fixation hardware noted.   IMPRESSION: *No active cardiopulmonary disease.     Electronically Signed   By: Jules Schick M.D.   On: 06/16/2023 08:20   DG Chest 2 View Result Date: 06/16/2023 CLINICAL DATA:  [id number] S/P MVR (mitral valve repair) [id number]. Shortness of breath. Chest pain. EXAM: CHEST - 2 VIEW COMPARISON:  06/12/2023. FINDINGS: Bilateral lung fields are clear. No acute consolidation or lung collapse. There are probable left retrocardiac atelectatic changes. Bilateral costophrenic angles are clear. Stable mildly enlarged cardio-mediastinal silhouette. Left-sided ICD device noted. Left atrial appendage closure device and prosthetic tricuspid/mitral valves noted. No acute osseous abnormalities. The soft tissues are within normal limits. Lower cervical spinal fixation hardware  noted. IMPRESSION: *No active cardiopulmonary disease. Electronically Signed   By: Jules Schick M.D.   On: 06/16/2023 08:20    [place] EKG SITE RITE Result Date: 06/12/2023 If Site Rite image not attached, placement could not be confirmed due to current cardiac rhythm.  CT ABDOMEN PELVIS WO CONTRAST Result Date: 06/11/2023 CLINICAL DATA:  Abdominal pain, acute, nonlocalized. Leukocytosis. Rectal bleeding. EXAM: CT ABDOMEN AND PELVIS WITHOUT CONTRAST TECHNIQUE: Multidetector CT imaging of the abdomen and pelvis was performed following the standard protocol without IV contrast. RADIATION DOSE REDUCTION: This exam was performed according to the departmental dose-optimization program which includes automated exposure control, adjustment of the mA and/or kV according to patient size and/or use of iterative reconstruction technique. COMPARISON:  Two-view abdomen 06/07/2023. CT of the abdomen pelvis as 12/01/2020 FINDINGS: Lower chest: Is mild dependent atelectasis is present both lungs. May heart is enlarged. Pacing wires are in place. No significant pleural or pericardial effusion is  present. Hepatobiliary: Liver is unremarkable. Layering stones are present in the gallbladder. Layering sludge also noted present. No inflammatory changes are present. The common bile duct is within normal limits. Pancreas: Unremarkable. No pancreatic ductal dilatation or surrounding inflammatory changes. Spleen: Normal in size without focal abnormality. Adrenals/Urinary Tract: The right adrenal gland is within normal limits. A 13 mm fat density lesion is stable in the left adrenal gland. A 5 mm fat containing lesion is present at the upper pole of the left kidney. A 1.5 cm stone or complex of stones is present at the upper pole of the left kidney. No other stone or mass lesion is present. No obstruction is present. The ureters are within normal limits bilaterally. A Foley catheter is present within the urinary bladder. Gas is present  within the urinary bladder. Stomach/Bowel: The stomach and duodenum are within normal limits. Small bowel is unremarkable. Is the terminal ileum is within normal limits. Appendix not discretely visualized. It may be surgically absent. No focal inflammatory changes are present in the region. Vascular/Lymphatic: Atherosclerotic calcifications are present in the aorta and branch vessels. No aneurysm is present. A right femoral venous line is in place. No significant adenopathy is present. Reproductive: Prostate is unremarkable. Other: No abdominal wall hernia or abnormality. No abdominopelvic ascites. Musculoskeletal: The vertebral body heights and alignment are normal. No focal osseous lesions are present. Fused anterior osteophytes extend from the thoracic spine to the L2 level. Fused anterior osteophytes are present at L2-3 and L4-S1. Median sternotomy is noted. Bony pelvis is within normal limits. The SI joints are fused bilaterally. Mild degenerative changes are present in the hips bilaterally. No acute focal osseous abnormality is present otherwise. IMPRESSION: 1. No acute or focal lesion to explain the patient's symptoms. 2. Cholelithiasis without secondary signs of cholecystitis. 3. 1.5 cm stone or complex of stones at the upper pole of the left kidney without obstruction. 4. Stable 13 mm fat density lesion in the left adrenal gland compatible with adrenal myelolipoma. 5. 5 mm fat containing lesion at the upper pole of the left kidney compatible with angiomyolipoma. 6. Cardiomegaly without failure. Electronically Signed   By: Marin Roberts M.D.   On: 06/11/2023 15:30   DG Abd 2 Views Result Date: 06/07/2023 CLINICAL DATA:  Follow-up abdominal distension. EXAM: ABDOMEN - 2 VIEW COMPARISON:  06/06/2023 FINDINGS: Persistent diffuse gaseous distension of the colon. The colon measures 7.9 cm at the level of the hepatic flexure. Previously 8.2 cm. Gas seen up to the level of the rectum. Gallstones noted in  the right upper quadrant of the abdomen. No signs of pneumoperitoneum. IMPRESSION: 1. Persistent diffuse gaseous distension of the colon.  Favor ileus. 2. Cholelithiasis. Electronically Signed EP STUDY Result Date: 06/03/2023 See surgical note for result.  ECHO TEE Result Date: 06/03/2023    TRANSESOPHOGEAL ECHO REPORT   Patient Name:   JEORGE REISTER Date of Exam: 06/03/2023 Medical Rec #:  [id number]      Height:       70.0 in Accession #:    [id number]     Weight:       199.5 lb Date of Birth:  [birth year]       BSA:          2.085 m Patient Age:    78 years       BP:           95/67 mmHg Patient Gender: M  HR:           101 bpm. Exam Location:  Inpatient Procedure: 3D Echo, Transesophageal Echo, Cardiac Doppler and Color Doppler            (Both Spectral and Color Flow Doppler were utilized during            procedure). Indications:     I48.91* Unspeicified atrial fibrillation  History:         Patient has prior history of Echocardiogram examinations, most                  recent 05/30/2023. Abnormal ECG, Mitral Valve Disease and                  Tricuspid valve repair, Arrythmias:Atrial Fibrillation and                  Bradycardia; Risk Factors:Dyslipidemia and Hypertension.                   Mitral Valve: prosthetic annuloplasty ring valve is present in                  the mitral position.  Sonographer:     Sheralyn Boatman RDCS Referring Phys:  [id number] BRIDGETTE CHRISTOPHER Diagnosing Phys: Jodelle Red MD PROCEDURE: After discussion of the risks and benefits of a TEE, an informed consent was obtained from the patient. The transesophogeal probe was passed without difficulty through the esophogus of the patient. Imaged were obtained with the patient in a left lateral decubitus position. Sedation performed by different physician. The patient was monitored while under deep sedation. Anesthestetic sedation was provided intravenously by Anesthesiology: 272.55mg  of Propofol, 40mg  of Lidocaine. The  patient's vital signs; including heart rate, blood pressure, and oxygen saturation; remained stable throughout the procedure. The patient developed no complications during the procedure. A successful direct current cardioversion was performed at 200 joules with 1 attempt.  IMPRESSIONS  1. Left ventricular ejection fraction, by estimation, is 35 to 40%. The left ventricle has moderately decreased function. The left ventricular internal cavity size was mildly to moderately dilated.  2. Right ventricular systolic function is moderately reduced. The right ventricular size is mildly enlarged. There is mildly elevated pulmonary artery systolic pressure. The estimated right ventricular systolic pressure is 38.9 mmHg.  3. S/P LA appendage atriclip. Left atrial size was moderately dilated. No left atrial/left atrial appendage thrombus was detected.  4. Right atrial size was moderately dilated.  5. A small pericardial effusion is present. The pericardial effusion is circumferential.  6. The mitral valve has been repaired/replaced. Trivial mitral valve regurgitation. No evidence of mitral stenosis. There is a prosthetic annuloplasty ring present in the mitral position. Echo findings are consistent with normal structure and function of the mitral valve prosthesis.  7. The tricuspid valve is has been repaired/replaced. The tricuspid valve is status post repair with an annuloplasty ring. Tricuspid valve regurgitation is mild to moderate.  8. The aortic valve is tricuspid. There is mild calcification of the aortic valve. Aortic valve regurgitation is not visualized. Aortic valve sclerosis is present, with no evidence of aortic valve stenosis.  9. Pulmonic valve regurgitation is moderate. 10. Aortic dilatation noted. There is mild dilatation of the ascending aorta, measuring 43 mm. There is mild (Grade II) plaque involving the descending aorta. 11. Cannot exclude a small PFO. Agitated saline contrast bubble study was negative, with  no evidence of any interatrial shunt. 12. Mitral, Tricuspid, and LAA and demonstrates  MVR/TVR stable, LAA appears clipped. Conclusion(s)/Recommendation(s): No LA/LAA thrombus identified. Successful cardioversion performed with restoration of normal sinus rhythm. FINDINGS  Left Ventricle: Left ventricular ejection fraction, by estimation, is 35 to 40%. The left ventricle has moderately decreased function. The left ventricular internal cavity size was mildly to moderately dilated. Right Ventricle: The right ventricular size is mildly enlarged. No increase in right ventricular wall thickness. Right ventricular systolic function is moderately reduced. There is mildly elevated pulmonary artery systolic pressure. The tricuspid regurgitant velocity is 2.78 m/s, and with an assumed right atrial pressure of 8 mmHg, the estimated right ventricular systolic pressure is 38.9 mmHg. Left Atrium: S/P LA appendage atriclip. Left atrial size was moderately dilated. No left atrial/left atrial appendage thrombus was detected. Right Atrium: Right atrial size was moderately dilated. Pericardium: A small pericardial effusion is present. The pericardial effusion is circumferential. Mitral Valve: The mitral valve has been repaired/replaced. Trivial mitral valve regurgitation. There is a prosthetic annuloplasty ring present in the mitral position. Echo findings are consistent with normal structure and function of the mitral valve prosthesis. No evidence of mitral valve stenosis. There is no evidence of mitral valve vegetation. Tricuspid Valve: The tricuspid valve is has been repaired/replaced. Tricuspid valve regurgitation is mild to moderate. No evidence of tricuspid stenosis. The tricuspid valve is status post repair with an annuloplasty ring. There is no evidence of tricuspid valve vegetation. Aortic Valve: The aortic valve is tricuspid. There is mild calcification of the aortic valve. Aortic valve regurgitation is not visualized. Aortic  valve sclerosis is present, with no evidence of aortic valve stenosis. There is no evidence of aortic valve  vegetation. Pulmonic Valve: The pulmonic valve was grossly normal. Pulmonic valve regurgitation is moderate. Aorta: Aortic dilatation noted. There is mild dilatation of the ascending aorta, measuring 43 mm. There is mild (Grade II) plaque involving the descending aorta. IAS/Shunts: Cannot exclude a small PFO. Agitated saline contrast was given intravenously to evaluate for intracardiac shunting. Agitated saline contrast bubble study was negative, with no evidence of any interatrial shunt. Additional Comments: Spectral Doppler performed.  AORTA Ao Asc diam: 4.30 cm TRICUSPID VALVE TR Peak grad:   30.9 mmHg TR Vmax:        278.00 cm/s Jodelle Red MD Electronically signed by Jodelle Red MD Signature Date/Time: 06/03/2023/4:20:20 PM    Final    EP PPM/ICD IMPLANT Result Date: 06/03/2023 CONCLUSIONS:  1. Successful implantation of a [employer] Jude single chamber pacemaker for symptomatic bradycardia due to sinus node dysfunction  2. No early apparent complications.       Lewayne Bunting, MD 06/03/2023 4:07 PM   DG CHEST PORT 1 VIEW Result Date: 06/02/2023 CLINICAL DATA:  Follow up pleural effusion. EXAM: PORTABLE CHEST 1 VIEW COMPARISON:  Radiographs 05/31/2023 and 05/29/2023.  CT 04/23/2023. FINDINGS: 0519 hours. The heart size and mediastinal contours are stable status post recent median sternotomy, dual valve replacement and left atrial appendage clipping. No evidence of pneumothorax. Interval improved aeration of both lung bases with probable small residual bilateral pleural effusions. The bones appear unchanged. IMPRESSION: Interval improved aeration of both lung bases with probable small residual bilateral pleural effusions. No evidence of pneumothorax. Electronically Signed   By: Carey Bullocks M.D.   On: 06/02/2023 09:30   DG CHEST PORT 1 VIEW Result Date: 05/31/2023 CLINICAL DATA:   Status post mitral valve repair. EXAM: PORTABLE CHEST 1 VIEW COMPARISON:  05/29/2023 FINDINGS: Chest tube has been removed. Right jugular central line is still present. Again noted are  postoperative changes in the heart with median sternotomy wires. Surgical plate in lower cervical spine. Negative for pneumothorax. Persistent densities in the lower chest bilaterally that could represent atelectasis and/or pleural fluid. Trachea remains midline. IMPRESSION: 1. Chest tube has been removed. Negative for pneumothorax. 2. Persistent densities in the lower chest bilaterally. Electronically Signed   By: Richarda Overlie M.D.   On: 05/31/2023 10:05   ECHOCARDIOGRAM LIMITED Result Date: 05/30/2023    ECHOCARDIOGRAM LIMITED REPORT   Patient Name:   Deonte Otting Klausing Date of Exam: 05/30/2023 Medical Rec #:  [id number]      Height:       70.0 in Accession #:    [id number]     Weight:       216.9 lb Date of Birth:  10-25-1945       BSA:          2.161 m Patient Age:    78 years       BP:           116/68 mmHg Patient Gender: M              HR:           70 bpm. Exam Location:  Inpatient Procedure: Transesophageal Echo (Both Spectral and Color Flow Doppler were            utilized during procedure). Indications:    EF  History:        Patient has prior history of Echocardiogram examinations, most                 recent 02/07/2023.  Sonographer:    Harriette Bouillon RDCS Referring Phys: Jeanella Craze IMPRESSIONS  1. Left ventricular ejection fraction, by estimation, is 30 to 35%. The left ventricle has moderately decreased function. The left ventricle demonstrates global hypokinesis. There is mild concentric left ventricular hypertrophy.  2. Right ventricular systolic function is moderately reduced. The right ventricular size is moderately enlarged.  3. Left atrial size was moderately dilated.  4. Right atrial size was moderately dilated.  5. The mitral valve has been repaired/replaced.  6. The inferior vena cava is dilated in size with <50%  respiratory variability, suggesting right atrial pressure of 15 mmHg. Conclusion(s)/Recommendation(s): Limited echo to reassess LV and RV function. There has been a mitral valve repair. Valve not interrogated with Doppler on this study. FINDINGS  Left Ventricle: Left ventricular ejection fraction, by estimation, is 30 to 35%. The left ventricle has moderately decreased function. The left ventricle demonstrates global hypokinesis. There is mild concentric left ventricular hypertrophy. Abnormal (paradoxical) septal motion, consistent with RV pacemaker. Right Ventricle: The right ventricular size is moderately enlarged. Right ventricular systolic function is moderately reduced. Left Atrium: Left atrial size was moderately dilated. Right Atrium: Right atrial size was moderately dilated. Pericardium: There is no evidence of pericardial effusion. Mitral Valve: The mitral valve has been repaired/replaced. There is a prosthetic annuloplasty ring present in the mitral position. Venous: The inferior vena cava is dilated in size with less than 50% respiratory variability, suggesting right atrial pressure of 15 mmHg. LEFT VENTRICLE PLAX 2D LVIDd:         5.50 cm LVIDs:         3.70 cm LV PW:         1.30 cm LV IVS:        1.30 cm  RIGHT ATRIUM           Index RA Area:  22.10 cm RA Volume:   69.20 ml  32.03 ml/m Arvilla Meres MD Electronically signed by Arvilla Meres MD Signature Date/Time: 05/30/2023/2:55:56 PM    Final    DG Chest Port 1 View Result Date: 05/29/2023 CLINICAL DATA:  Mitral valve replacement. EXAM: PORTABLE CHEST 1 VIEW COMPARISON:  05/28/2023 FINDINGS: Low volume film. The cardio pericardial silhouette is enlarged. Bibasilar atelectasis with small bilateral pleural effusions, left greater than right. Interval removal of pulmonary artery catheter with right IJ sheath still in place. Midline mediastinal/pericardial drains again noted. IMPRESSION: Low volume film with bibasilar atelectasis and small  bilateral pleural effusions, left greater than right. Electronically Signed   By: Kennith Center M.D.   On: 05/29/2023 08:24   ECHO INTRAOPERATIVE TEE Result Date: 05/28/2023  *INTRAOPERATIVE TRANSESOPHAGEAL REPORT *  Patient Name:   HILLEL CARD Date of Exam: 05/27/2023 Medical Rec #:  [id number]      Height:       70.0 in Accession #:    [id number]     Weight:       205.0 lb Date of Birth:  28-Jul-1945       BSA:          2.11 m Patient Age:    78 years       BP:           153/93 mmHg Patient Gender: M              HR:           84 bpm. Exam Location:  Inpatient Transesophogeal exam was perform intraoperatively during surgical procedure. Patient was closely monitored under general anesthesia during the entirety of examination. Indications:     Mitral and tricuspid valve abnormaility Performing Phys: [id number] Eugenio Hoes Diagnosing Phys: Earl Lites Stoltzfus Complications: No known complications during this procedure. POST-OP IMPRESSIONS _ Left Ventricle: The left ventricle is unchanged from pre-bypass. _ Right Ventricle: The right ventricle appears unchanged from pre-bypass. _ Aorta: The aorta appears unchanged from pre-bypass. _ Left Atrial Appendage: A is completely occluded device was placed. _ Aortic Valve: The aortic valve appears unchanged from pre-bypass. There is mild regurgitation. _ Mitral Valve: The gradient recorded across the prosthetic valve is within the expected range. The mitral valve is status post repair with an annuloplasty ring. _ Tricuspid Valve: The gradient recorded across the prosthetic valve is within the expected range. No perivalvular leak noted. _ Pulmonic Valve: The pulmonic valve appears unchanged from pre-bypass. _ Interatrial Septum: The interatrial septum appears unchanged from pre-bypass. _ Pericardium: The pericardium appears unchanged from pre-bypass. _ Comments: S/P MV repair with 32mm Simulus semi rigid band with P2 triangular resection, TV repair with a 34mm MC3 annuloplasty  ring, MAZE for AF, LAA closure with 50mm Medtronic clip. Mildly worseing central AI post procedure from baseline. Both rings seated appropriately without leak or rock. Gradients WNL. EF 40-45% post procedure, emergence from CPB requiring epinephrine and phenylephrine support. PRE-OP FINDINGS  Left Ventricle: The left ventricle has mildly reduced systolic function, with an ejection fraction of 45-50%. The cavity size was normal. Left ventricular diastolic function could not be evaluated due to atrial fibrillation. Left ventricular diastolic function could not be evaluated. Right Ventricle: The right ventricle has normal systolic function. The cavity was dialated. There is no increase in right ventricular wall thickness. Right ventricular systolic pressure is mildly elevated. Left Atrium: Left atrial size was dilated. No left atrial/left atrial appendage thrombus was detected. There is echo contrast seen in the  left atrial cavity. Left atrial appendage velocity is normal at greater than 40 cm/s. Right Atrium: Right atrial size was dilated. Interatrial Septum: No atrial level shunt detected by color flow Doppler. There is no evidence of a patent foramen ovale. Pericardium: There is no evidence of pericardial effusion. There is no pleural effusion. Mitral Valve: The mitral valve is myxomatous. Mitral valve regurgitation moderate to severe. The MR jet is anteriorly-directed. Pulmonary venous flow shows systolic flow reversal. Moderate prolapse of the mitral valve P2 cusp was present. PISA EROA .35cm2, RV 48cc, VC .63cm. Tricuspid Valve: The tricuspid valve was normal in structure. Tricuspid valve regurgitation is severe by color flow Doppler. The jet is directed toward the atrial septum. No evidence of tricuspid stenosis is present. Severely dilated annulus (5.05cm), S wave reversal hepatic veins. Aortic Valve: The aortic valve is tricuspid Aortic valve regurgitation is trivial by color flow Doppler. The jet is  centrally-directed. There is no stenosis of the aortic valve, with a calculated valve area of 2.54 cm. There is no evidence of aortic valve vegetation. Pulmonic Valve: The pulmonic valve was normal in structure, with normal. No evidence of pumonic stenosis. Pulmonic valve regurgitation is trivial by color flow Doppler. Aorta: The ascending aorta and aortic root are normal in size and structure. Pulmonary Artery: Theone Murdoch catheter present on the right. The pulmonary artery is of normal size. Venous: The inferior vena cava measures 2.50 cm, is dilated in size with less than 50% respiratory variability, suggesting right atrial pressure of 15 mmHg. Shunts: There is no evidence of an atrial septal defect. +--------------+--------++ LEFT VENTRICLE         +--------------+--------++ PLAX 2D                +--------------+--------++ LVOT diam:    2.30 cm  +--------------+--------++ LVOT Area:    4.15 cm +--------------+--------++                        +--------------+--------++ +---------------+---------++ RIGHT VENTRICLE          +---------------+---------++ RVSP:          41.4 mmHg +---------------+---------++ +------------+----------+++ RIGHT ATRIUM           +------------+----------+++ RA Pressure:15.00 mmHg +------------+----------+++  +------------------+-----------++ AORTIC VALVE                  +------------------+-----------++ AV Area (Vmax):   2.60 cm    +------------------+-----------++ AV Area (Vmean):  2.29 cm    +------------------+-----------++ AV Area (VTI):    2.54 cm    +------------------+-----------++ AV Vmax:          85.60 cm/s  +------------------+-----------++ AV Vmean:         60.800 cm/s +------------------+-----------++ AV VTI:           0.165 m     +------------------+-----------++ AV Peak Grad:     2.9 mmHg    +------------------+-----------++ AV Mean Grad:     2.0 mmHg    +------------------+-----------++ LVOT  Vmax:        53.50 cm/s  +------------------+-----------++ LVOT Vmean:       33.500 cm/s +------------------+-----------++ LVOT VTI:         0.101 m     +------------------+-----------++ LVOT/AV VTI ratio:0.61        +------------------+-----------++  +--------------+-------++ AORTA                 +--------------+-------++ Ao Sinus diam:4.20 cm +--------------+-------++ Ao STJ diam:  3.6 cm  +--------------+-------++  Ao Asc diam:  4.50 cm +--------------+-------++ +-------------+---------++       +---------------+-----------++ MITRAL VALVE                 TRICUSPID VALVE            +-------------+---------++       +---------------+-----------++ MV Peak grad:3.2 mmHg        TR Peak grad:  26.4 mmHg   +-------------+---------++       +---------------+-----------++ MV Mean grad:1.0 mmHg        TR Mean grad:  17.0 mmHg   +-------------+---------++       +---------------+-----------++ MV Vmax:     0.90 m/s        TR Vmax:       257.00 cm/s +-------------+---------++       +---------------+-----------++ MV Vmean:    48.1 cm/s       TR Vmean:      191.0 cm/s  +-------------+---------++       +---------------+-----------++ MV VTI:      0.12 m          Estimated RAP: 15.00 mmHg  +-------------+---------++       +---------------+-----------++ +----------------+-----------++  RVSP:          41.4 mmHg   MR Peak grad:   66.6 mmHg    +---------------+-----------++ +----------------+-----------++ MR Mean grad:   46.0 mmHg    +--------------+-------+ +----------------+-----------++  SHUNTS                MR Vmax:        408.00 cm/s  +--------------+-------+ +----------------+-----------++  Systemic VTI: 0.10 m  MR Vmean:       316.0 cm/s   +--------------+-------+ +----------------+-----------++  Systemic Diam:2.30 cm MR PISA:        4.02 cm     +--------------+-------+ +----------------+-----------++ MR PISA Eff  ROA:35 mm      +----------------+-----------++ MR PISA Radius: 0.80 cm     +----------------+-----------++ +---------+-------+ IVC              +---------+-------+ IVC diam:2.50 cm +---------+-------+  Hester Mates Electronically signed by Hester Mates Signature Date/Time: 05/28/2023/5:30:13 PM    Final    DG Chest Port 1 View Result Date: 05/28/2023 CLINICAL DATA:  Mitral valve repair, tricuspid valve repair EXAM: PORTABLE CHEST - 1 VIEW COMPARISON:  05/27/2023 FINDINGS: Patient has been extubated and the gastric tube removed. Right IJ Swan-Ganz loops near the pulmonary artery bifurcation. Mediastinal drain remains in place. Bibasilar opacities left greater than right, partially improved. Heart size and mediastinal contours are within normal limits. No definite effusion. Sternotomy wires. IMPRESSION: 1. Interval extubation. 2. Improving bibasilar opacities. Electronically Signed   By: Corlis Leak M.D.   On: 05/28/2023 10:18   DG Chest Port 1 View Result Date: 05/27/2023 CLINICAL DATA:  Status post mitral valve repair. EXAM: PORTABLE CHEST 1 VIEW COMPARISON:  Chest radiograph dated 05/23/2023. FINDINGS: Endotracheal tube approximately 4.5 cm above the carina. Enteric tube extends below the diaphragm with tip beyond the inferior margin of the image. Right IJ Swan-Ganz catheter with tip projecting over the left hilum, likely in the right pulmonary artery. Bilateral streaky atelectasis. Consolidative changes of the left lung base may represent atelectasis. Pneumonia is not excluded no pneumothorax. Stable cardiomegaly. Cardiac valve repair. No acute osseous pathology. IMPRESSION: 1. Support apparatus as above. 2. Bilateral atelectasis. Electronically Signed   By: Elgie Collard M.D.   On: 05/27/2023 15:41   DG Chest 2 View Result Date: 05/26/2023  CLINICAL DATA:  Preop chest exam. Nonrheumatic mitral valve regurgitation. Tricuspid valve insufficiency. Upcoming mitral valve repair. EXAM:  CHEST - 2 VIEW COMPARISON:  Radiograph 06/26/2007, CT 04/23/2023 FINDINGS: The heart is enlarged. Mild aortic tortuosity. No focal airspace disease, pleural effusion, or pneumothorax. Normal pulmonary vasculature. Mild thoracic spondylosis. Lower cervical spine hardware partially included in the field of view. IMPRESSION: Unchanged cardiomegaly.  No acute findings. Electronically Signed   By: Narda Rutherford M.D.   On: 05/26/2023 22:33   VAS US CAROTID Result Date: 05/23/2023 Carotid Arterial Duplex Study Patient Name:  ABAD MANARD  Date of Exam:   05/23/2023 Medical Rec #: [id number]       Accession #:    [id number] Date of Birth: January 14, 1946        Patient Gender: M Patient Age:   12 years Exam Location:  [facility] Procedure:      VAS US CAROTID Referring Phys: Eugenio Hoes --------------------------------------------------------------------------------  Indications:       Severe mitral regurgitation [536644], Tricuspid regurgitation                    [206723], A-fib (HCC) [034742]. Risk Factors:      Hypertension. Comparison Study:  No prior studies. Performing Technologist: Chanda Busing RVT  Examination Guidelines: A complete evaluation includes B-mode imaging, spectral Doppler, color Doppler, and power Doppler as needed of all accessible portions of each vessel. Bilateral testing is considered an integral part of a complete examination. Limited examinations for reoccurring indications may be performed as noted.  Right Carotid Findings: +----------+--------+--------+--------+-----------------------+--------+           PSV cm/sEDV cm/sStenosisPlaque Description     Comments +----------+--------+--------+--------+-----------------------+--------+ CCA Prox  71      15              smooth and heterogenous         +----------+--------+--------+--------+-----------------------+--------+ CCA Distal74      22              smooth and heterogenous          +----------+--------+--------+--------+-----------------------+--------+ ICA Prox  77      19              smooth and heterogenous         +----------+--------+--------+--------+-----------------------+--------+ ICA Mid   36      19                                              +----------+--------+--------+--------+-----------------------+--------+ ICA Distal71      36                                     tortuous +----------+--------+--------+--------+-----------------------+--------+ ECA       72      20                                              +----------+--------+--------+--------+-----------------------+--------+ +----------+--------+-------+--------+-------------------+           PSV cm/sEDV cmsDescribeArm Pressure (mmHG) +----------+--------+-------+--------+-------------------+ [id number]                                         +----------+--------+-------+--------+-------------------+ +---------+--------+--+--------+--+---------+  VertebralPSV cm/s34EDV cm/s11Antegrade +---------+--------+--+--------+--+---------+  Left Carotid Findings: +----------+--------+--------+--------+-----------------------+--------+           PSV cm/sEDV cm/sStenosisPlaque Description     Comments +----------+--------+--------+--------+-----------------------+--------+ CCA Prox  91      17              smooth and heterogenous         +----------+--------+--------+--------+-----------------------+--------+ CCA Distal57      18              smooth and heterogenous         +----------+--------+--------+--------+-----------------------+--------+ ICA Prox  52      12                                              +----------+--------+--------+--------+-----------------------+--------+ ICA Mid   44      21                                     tortuous +----------+--------+--------+--------+-----------------------+--------+ ICA Distal50      15                                      tortuous +----------+--------+--------+--------+-----------------------+--------+ ECA       73      12                                              +----------+--------+--------+--------+-----------------------+--------+ +----------+--------+--------+--------+-------------------+           PSV cm/sEDV cm/sDescribeArm Pressure (mmHG) +----------+--------+--------+--------+-------------------+ [id number]                                          +----------+--------+--------+--------+-------------------+ +---------+--------+--+--------+--+---------+ VertebralPSV cm/s36EDV cm/s15Antegrade +---------+--------+--+--------+--+---------+   Summary: Right Carotid: Velocities in the right ICA are consistent with a 1-39% stenosis. Left Carotid: Velocities in the left ICA are consistent with a 1-39% stenosis. Vertebrals: Bilateral vertebral arteries demonstrate antegrade flow. *See table(s) above for measurements and observations.  Electronically signed by Carolynn Sayers on 05/23/2023 at 12:33:58 PM.    Final      Treatments: surgery:  CARDIOVASCULAR SURGERY OPERATIVE NOTE   05/27/2023 Taggert Bozzi Swinson [id number]   Surgeon:  Ashley Akin, MD   First Assistant: Gershon Crane [facility]                               An experienced assistant was required given the complexity of this surgery and the standard of surgical care. The assistant was needed for exposure, dissection, suctioning, retraction of delicate tissues and sutures, instrument exchange and for overall help during this procedure.       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 rate 20, height 5\' 10"  (1.778 m), weight 86.9 kg, SpO2 95%. Cardiovascular: [facility] Pulmonary: Clear to auscultation bilaterally Abdomen: Soft, distended, non tender, high pitched, "tinkling" bowel sounds present. Extremities: No lower extremity edema. Wound: Clean and dry.  No erythema or signs of infection. Dressing clean and dry PPM wound   Discharge Medications:  The patient has been discharged on:   1.Beta Blocker:  Yes [  x ]                              No   [   ]                              If No, reason:  2.Ace Inhibitor/ARB: Yes [   ]                                     No  [   x ]                                     If No, reason:Labile BP  3.Statin:   Yes [   ]                  No  [ x  ]                  If No, reason:No CAD  4.Marlowe Kays:  Yes  [  x ]                  No   [   ]                  If No, reason:  Patient had ACS upon admission:  Plavix/P2Y12 inhibitor: Yes [   ]                                      No  [  x ]     Discharge Instructions     AMB Referral to Cardiac Rehabilitation - Phase II   Complete by: As directed    Diagnosis: Valve Repair   Valve:  Tricuspid Mitral     After initial evaluation and assessments completed: Virtual Based Care may be provided alone or in conjunction with Phase 2 Cardiac Rehab based on patient barriers.: Yes   Intensive Cardiac Rehabilitation (ICR) [facility] location only OR Traditional Cardiac Rehabilitation (TCR) *If criteria for ICR are not met will enroll in TCR [facility] only): Yes      Allergies as of 06/18/2023   No Known Allergies      Medication List  STOP taking these  medications    amoxicillin-clavulanate 500-125 MG tablet Commonly known as: AUGMENTIN   furosemide 20 MG tablet Commonly known as: Lasix   losartan 25 MG tablet Commonly known as: COZAAR   metoprolol succinate 50 MG 24 hr tablet Commonly known as: TOPROL-XL   Xarelto 20 MG Tabs tablet Generic drug: rivaroxaban       TAKE these medications    amiodarone 400 MG tablet Commonly known as: PACERONE Take 200 mg by mouth 2 times daily for 7 days;then take 200 mg daily thereafter   apixaban 2.5 MG Tabs tablet Commonly known as: ELIQUIS Take 1 tablet (2.5 mg total) by mouth 2 (two) times daily.   aspirin EC 81 MG tablet Take 1 tablet (81 mg total) by mouth daily. Swallow whole.   atorvastatin 20 MG tablet Commonly known as: LIPITOR Take 1 tablet by mouth once daily   digoxin 0.125 MG tablet Commonly known as: LANOXIN Take 1 tablet (0.125 mg total) by mouth daily.   Fe Fum-Vit C-Vit B12-FA Caps capsule Commonly known as: TRIGELS-F FORTE Take 1 capsule by mouth daily.   pantoprazole 40 MG tablet Commonly known as: PROTONIX Take 1 tablet (40 mg total) by mouth 2 (two) times daily.   traMADol 50 MG tablet Commonly known as: ULTRAM TAKE 1 TABLET BY MOUTH EVERY 6 HOURS AS NEEDED.               Durable Medical Equipment  (From admission, onward)           Start     Ordered   06/06/23 1048  For home use only DME Walker rolling  Once       Question Answer Comment  Walker: With 5 Inch Wheels   Patient needs a walker to treat with the following condition Physical deconditioning      06/06/23 1047            Follow-up Information     [facility] [place]. Go on 07/08/2023.   Specialty: Cardiothoracic Surgery Why: Appointment time is at 2:00 pm Contact information: [address] [place], [address] [place] [address] [phone]         IMAGING Follow up.   Why: Please arrive by 1:00 pm in  order to have PA/LAT CXR taken PRIOR to office appointment with PT Contact information: [address] [place] [address]        Gaston Islam., NP. Go on 07/07/2023.   Specialty: Cardiology Why: Appointment time is at 10:05 am Contact information: [address] [place] [address] [phone]         Napoleon Form, MD. Call.   Specialty: Gastroenterology Why: for a follow up appointment regarding tubular adenoma transverse colon Contact information: [address] [place] [address] [phone]                 Signed:  Ardelle Balls, PA-C 06/18/2023, 8:17 AM

## 2023-05-30 NOTE — TOC Benefit Eligibility Note (Signed)
Pharmacy Patient Advocate Encounter  Insurance verification completed.    The patient is insured through  [facility] . Patient has Medicare and is not eligible for a copay card, but may be able to apply for patient assistance or Medicare RX Payment Plan (Patient Must reach out to their plan, if eligible for payment plan), if available.    Ran test claim for Eliquis and the current 30 day co-pay is $45.   This test claim was processed through [facility]- copay amounts may vary at other pharmacies due to pharmacy/plan contracts, or as the patient moves through the different stages of their insurance plan.

## 2023-05-30 NOTE — Progress Notes (Signed)
  Echocardiogram 2D Echocardiogram has been performed.  Leda Roys RDCS 05/30/2023, 2:50 PM

## 2023-05-30 NOTE — Progress Notes (Addendum)
NAME:  [name] [name], MRN:  [id number], DOB:  Dec 01, 1945, LOS: 3 ADMISSION DATE:  05/27/2023, CONSULTATION DATE:  2/18 REFERRING MD:  Leafy Ro, CHIEF COMPLAINT:  post op critical care support    History of Present Illness:  78 year old male w/ medical hx as outlined below. Followed by both cards and CVTS for mod/svr MR w/ progressive fatigue. Presented to [facility] 2/18 for elective MR/TR and MAZE Intra-op echo 45-50% EF, LV global hypokinesis, mildly elevated PAS,  RV mildly enlarged.  Bypass started 0852 ended 1118  cell saver  To CVICU post op  PCCM asked to assist w/ post op care  Pertinent  Medical History  Severe MR, mod TR, HFmrEF (EF 55%), Dysrhythmia, HL, PAF s/p ablation 2012 (on xarelto), Prior CVA,chronic UTIs, prostate cancer s/p prostatectomy  Significant Hospital Events: Including procedures, antibiotic start and stop dates in addition to other pertinent events    2/18 MAZE, MV repair, TV repair, extubated overnight  Interim History / Subjective:  Overnight no acute events. Pain is controlled.  Off Neo-Synephrine today.  Objective   Blood pressure (!) 72/63, pulse (!) 118, temperature 98.5 F (36.9 C), temperature source Oral, resp. rate (!) 21, height 5\' 10"  (1.778 m), weight 98.4 kg, SpO2 94%.        Intake/Output Summary (Last 24 hours) at 05/30/2023 0713 Last data filed at 05/30/2023 0600 Gross per 24 hour  Intake 28.36 ml  Output 1590 ml  Net -1561.64 ml   Filed Weights   05/27/23 0606 05/28/23 0500 05/30/23 0500  Weight: 93 kg 98 kg 98.4 kg    Examination: General: elderly man sitting up in bed in NAD HENT: [place]/AT, eyes anicteric lungs: breathing comfortably on [place], rhales bilaterally Cardiovascular: S1S2, RRR, still A-paced. Minimal chest tube output. Sternal incision healing well. abdomen: soft, NT Extremities: + edema, no cyanosis Neuro: awake, alert, moving all extremities GU: Clear yellow urine  BUN 37 Cr 1.47 WBC 10.8 H/H 10.5/33.2 Platelets  105  I/O -1.6L   Resolved Hospital Problem list     Assessment & Plan:  H/o Severe mitral regurgitation, tricuspid regurgitation, Afib, s/p MV and TV repair w/ MAZE on 2/18 -Postop care per T CTS, intolerant of V pacing.  EP consult for tachybradycardia syndrome. - Start DOAC-apixaban - Holding amiodarone - Continue diuresis, Lasix 40 twice daily - Pain control per protocol - Aspirin, statin - DC chest tubes today - Telemetry monitoring -Continue weaning oxygen  Hyperglycemia; h/o pre-DM. A1c 5.8 -Sliding scale insulin as needed-has not needed - Goal blood glucose less than 180  ABLA- expected post-op Consumptive thrombocytopenia- expected post-op Consumptive coagulopathy- expected post-op -Transfuse per TCTS; no current indication for transfusion  H/o HTN & HFrEF  -Continue holding PTA losartan and Toprol-XL    Best Practice (right click and "Reselect all SmartList Selections" daily)   Diet/type: Regular consistency (see orders) DVT prophylaxis DOAC Pressure ulcer(s): N/A GI prophylaxis: PPI Lines: Central line Foley:  Yes, and it is still needed Code Status:  full code Last date of multidisciplinary goals of care discussion [per primary ]  Labs   CBC: Recent Labs  Lab 05/27/23 1318 05/27/23 1757 05/27/23 1811 05/27/23 1859 05/28/23 0115 05/28/23 0310 05/28/23 1818 05/29/23 0500  WBC 17.7*  --  16.3*  --   --  14.3* 16.4* 15.1*  HGB 12.8*  12.2*   < > 11.3* 11.2* 10.2* 10.4* 10.9* 10.8*  HCT 38.6*  36.0*   < > 34.5* 33.0* 30.0* 32.0* 33.2* 33.3*  MCV 93.5  --  93.2  --   --  93.6 94.3 94.3  PLT 132*  --  128*  --   --  110* 99* 100*   < > = values in this interval not displayed.    Basic Metabolic Panel: Recent Labs  Lab 05/27/23 1811 05/27/23 1859 05/27/23 2044 05/28/23 0115 05/28/23 0310 05/28/23 1818 05/29/23 0500  NA 148*   < > 137 140 136 136 135  K 3.8   < > 4.0 4.4 4.1 4.6 4.4  CL 110  --  106 103 106 105 103  CO2 21*  --  20*  --   20* 21* 22  GLUCOSE 151*  --  220* 139* 138* 150* 107*  BUN 14  --  14 17 14 21  26*  CREATININE 1.34*  --  1.11 0.90 1.08 1.24 1.18  CALCIUM 8.0*  --  7.5*  --  7.8* 7.9* 8.1*  MG 3.3*  --  2.7*  --  2.4 2.3 2.5*   < > = values in this interval not displayed.   GFR: Estimated Creatinine Clearance: 58.8 mL/min (by C-G formula based on SCr of 1.18 mg/dL). Recent Labs  Lab 05/27/23 1811 05/28/23 0310 05/28/23 1818 05/29/23 0500  WBC 16.3* 14.3* 16.4* 15.1*        Critical care time:       Steffanie Dunn, DO 05/30/23 3:03 PM [facility]  For contact information, see Amion. If no response to pager, please call PCCM consult pager. After hours, 7PM- 7AM, please call Elink.

## 2023-05-30 NOTE — Plan of Care (Signed)
  Problem: Education: Goal: Knowledge of General Education information will improve Description: Including pain rating scale, medication(s)/side effects and non-pharmacologic comfort measures Outcome: Progressing   Problem: Health Behavior/Discharge Planning: Goal: Ability to manage health-related needs will improve Outcome: Progressing   Problem: Clinical Measurements: Goal: Ability to maintain clinical measurements within normal limits will improve Outcome: Progressing Goal: Will remain free from infection Outcome: Progressing Goal: Diagnostic test results will improve Outcome: Progressing Goal: Respiratory complications will improve Outcome: Progressing Goal: Cardiovascular complication will be avoided Outcome: Progressing   Problem: Activity: Goal: Risk for activity intolerance will decrease Outcome: Progressing   Problem: Nutrition: Goal: Adequate nutrition will be maintained Outcome: Progressing   Problem: Coping: Goal: Level of anxiety will decrease Outcome: Progressing   Problem: Elimination: Goal: Will not experience complications related to bowel motility Outcome: Progressing Goal: Will not experience complications related to urinary retention Outcome: Progressing   Problem: Pain Managment: Goal: General experience of comfort will improve and/or be controlled Outcome: Progressing   Problem: Safety: Goal: Ability to remain free from injury will improve Outcome: Progressing   Problem: Skin Integrity: Goal: Risk for impaired skin integrity will decrease Outcome: Progressing   Problem: Education: Goal: Will demonstrate proper wound care and an understanding of methods to prevent future damage Outcome: Progressing Goal: Knowledge of disease or condition will improve Outcome: Progressing Goal: Knowledge of the prescribed therapeutic regimen will improve Outcome: Progressing   Problem: Activity: Goal: Risk for activity intolerance will decrease Outcome:  Progressing   Problem: Cardiac: Goal: Will achieve and/or maintain hemodynamic stability Outcome: Progressing   Problem: Clinical Measurements: Goal: Postoperative complications will be avoided or minimized Outcome: Progressing   Problem: Respiratory: Goal: Respiratory status will improve Outcome: Progressing   Problem: Skin Integrity: Goal: Wound healing without signs and symptoms of infection Outcome: Progressing Goal: Risk for impaired skin integrity will decrease Outcome: Progressing   Problem: Urinary Elimination: Goal: Ability to achieve and maintain adequate renal perfusion and functioning will improve Outcome: Progressing

## 2023-05-30 NOTE — Consult Note (Addendum)
ELECTROPHYSIOLOGY CONSULT NOTE    Patient ID: [name] [name] MRN: [id number], DOB/AGE: [birth year] 80 y.o.  Admit date: 05/27/2023 Date of Consult: 05/30/2023  Primary Physician: [name], MD Primary Cardiologist: [name], MD  Electrophysiologist: Dr. Ladona Ridgel   Referring Provider: Dr. Leafy Ro / [facility]  Patient Profile: [name] [name] is a 78 y.o. male with a history of severe MR, moderate TR, HFmrEF, HLD, PAF previously on flecainide, AFL s/p ablation 2012 on Xarelto, CVA, chronic UTI's, prostate cancer s/p prostatectomy who is being seen today for the evaluation of tachy-brady at the request of Dr. Leafy Ro.  HPI:  [name] [name] is a 78 y.o. male who presented to [facility] on 05/27/23 for elective MR/TR and MAZE.   He is followed by Cardiology and [facility] for severe symptomatic MR/TR and longstanding AF.  The patient underwent planned mitral valve repair with triangular resection of the P2 segment, placement of 32mm Stimulus Semi Rigid Band, tricuspid valve repair with a 34 mm MC3 annuloplasty ring, full left and right sided MAZE with radiofrequency and cryo lesions, LA closure with 50 mm MDT clip. Intra-operative ECHO showed LVEF 45-50%, global hypokinesis, mildly elevated PAS, RV mildly enlarged.  Post-operative course complicated by bradycardia issues.  His amiodarone was stopped on 05/30/23.  Pt requiring active diuresis and ventricular pacing. Chest tubes removed am 2/21 and pt out of bed. Eliquis restarted.  Hgb 10.5, platelets 105.   He denies chest pain, palpitations, dyspnea, PND, orthopnea, nausea, vomiting, dizziness, syncope, edema, weight gain, or early satiety.   Labs Potassium3.8 (02/21 4098) Magnesium  2.5* (02/20 0500) Creatinine, ser  1.47* (02/21 0727) PLT  105* (02/21 0727) HGB  10.5* (02/21 0727) WBC 10.8* (02/21 0727)    Past Medical History:  Diagnosis Date   Arthritis    Back   CVA (cerebral infarction) 07/14/2009   TIA   Dysrhythmia    A. Fib    Heart murmur    History of kidney stones    Hyperlipidemia    Hypertension    Nephrolithiasis    Paroxysmal atrial fibrillation (HCC)    Paroxysmal atrial flutter (HCC)    a. s/p ablation 2012.   Prostate cancer (HCC)    Stroke (HCC)    TIA - no deficits     Surgical History:  Past Surgical History:  Procedure Laterality Date   APPENDECTOMY  1967   CATARACT EXTRACTION W/ INTRAOCULAR LENS IMPLANT Bilateral    CERVICAL SPINE SURGERY     Cadaver Bone Placed   COLONOSCOPY     CYSTOSCOPY/URETEROSCOPY/HOLMIUM LASER/STENT PLACEMENT Left 09/19/2020   Procedure: CYSTOSCOPY RETROGRADE PYELOGRAM URETEROSCOPY/HOLMIUM LASER/STENT PLACEMENT;  Surgeon: Bjorn Pippin, MD;  Location: [facility]S;  Service: Urology;  Laterality: Left;   EYE SURGERY Bilateral    Eye Lid lifting   LUMBAR SPINE SURGERY     Ruptured Disc   MAZE N/A 05/27/2023   Procedure: MAZE;  Surgeon: Eugenio Hoes, MD;  Location: [facility] OR;  Service: Open Heart Surgery;  Laterality: N/A;   MITRAL VALVE REPAIR N/A 05/27/2023   Procedure: MITRAL VALVE REPAIR USING MEDTRONIC SIMULUS SEMI-RIDGID ANNULOPLASTY BAND (MVR);  Surgeon: Eugenio Hoes, MD;  Location: [facility] OR;  Service: Open Heart Surgery;  Laterality: N/A;   PROSTATECTOMY  2005   RIGHT/LEFT HEART CATH AND CORONARY ANGIOGRAPHY N/A 02/27/2023   Procedure: RIGHT/LEFT HEART CATH AND CORONARY ANGIOGRAPHY;  Surgeon: [name], MD;  Location: [facility] INVASIVE CV LAB;  Service: Cardiovascular;  Laterality: N/A;   TEE WITHOUT  CARDIOVERSION N/A 02/07/2023   Procedure: TRANSESOPHAGEAL ECHOCARDIOGRAM;  Surgeon: Thurmon Fair, MD;  Location: [facility] INVASIVE CV LAB;  Service: Cardiovascular;  Laterality: N/A;   TEE WITHOUT CARDIOVERSION N/A 05/27/2023   Procedure: TRANSESOPHAGEAL ECHOCARDIOGRAM (TEE);  Surgeon: Eugenio Hoes, MD;  Location: [facility] OR;  Service: Open Heart Surgery;  Laterality: N/A;   TRICUSPID VALVE REPLACEMENT N/A 05/27/2023   Procedure: TRICUSPID VALVE REPAIR USING EDWARDS MC3  TRICUSPID ANNULOPLASTY RING;  Surgeon: Eugenio Hoes, MD;  Location: [facility] OR;  Service: Open Heart Surgery;  Laterality: N/A;     Medications Prior to Admission  Medication Sig Dispense Refill Last Dose/Taking   amoxicillin-clavulanate (AUGMENTIN) 500-125 MG tablet Take 1 tablet by mouth at bedtime.   05/26/2023 at 10:00 PM   atorvastatin (LIPITOR) 20 MG tablet Take 1 tablet by mouth once daily 90 tablet 1 05/26/2023 at 10:00 PM   furosemide (LASIX) 20 MG tablet Take 1 tablet (20 mg total) by mouth daily. (Patient taking differently: Take 20 mg by mouth daily as needed for fluid or edema.) 30 tablet 11 05/25/2023   losartan (COZAAR) 25 MG tablet Take 1 tablet (25 mg total) by mouth daily. (Patient taking differently: Take 25 mg by mouth every evening.) 90 tablet 3 05/26/2023 at 10:00 PM   metoprolol succinate (TOPROL-XL) 50 MG 24 hr tablet Take 1 tablet (50 mg total) by mouth at bedtime. Take with or immediately following a meal. (Patient taking differently: Take 75 mg by mouth at bedtime. Take with or immediately following a meal.) 90 tablet 3 05/26/2023 at 10:00 PM   rivaroxaban (XARELTO) 20 MG TABS tablet TAKE 1 TABLET BY MOUTH ONCE DAILY WITH SUPPER 90 tablet 1 Taking   traMADol (ULTRAM) 50 MG tablet TAKE 1 TABLET BY MOUTH EVERY 6 HOURS AS NEEDED. 120 tablet 0 Past Week    Inpatient Medications:   acetaminophen  1,000 mg Oral Q6H   Or   acetaminophen (TYLENOL) oral liquid 160 mg/5 mL  1,000 mg Per Tube Q6H   apixaban  5 mg Oral BID   aspirin  81 mg Oral Daily   bisacodyl  10 mg Oral Daily   Or   bisacodyl  10 mg Rectal Daily   Chlorhexidine Gluconate Cloth  6 each Topical Daily   docusate sodium  200 mg Oral Daily   feeding supplement  237 mL Oral BID BM   furosemide  40 mg Intravenous Q12H   insulin aspart  0-24 Units Subcutaneous TID WC   insulin aspart  0-5 Units Subcutaneous QHS   pantoprazole  40 mg Oral Daily   potassium chloride  20 mEq Oral Q4H   sodium chloride flush  3 mL  Intravenous Q12H   sodium chloride flush  3-10 mL Intravenous Q12H    Allergies: No Known Allergies  Family History  Problem Relation Age of Onset   Heart disease Mother 47       died   Prostate cancer Father        alive   Testicular cancer Brother        died     Physical Exam: Vitals:   05/30/23 0930 05/30/23 0945 05/30/23 1000 05/30/23 1015  BP: 114/63 118/67 119/69 123/71  Pulse: 68 68 68 72  Resp: 17 20 15 20   Temp:      TempSrc:      SpO2: 96% 96% 97% 95%  Weight:      Height:        GEN- NAD, A&O x 3, normal  affect   HEENT: Normocephalic, atraumatic Lungs- CTAB, Normal effort.  Heart- Regular rate and rhythm, No M/G/R. Sternal incision clean/dry, well approximated. Pacing wires in place GI- Soft, NT, ND.  Extremities- No clubbing, cyanosis. LE edema 1+ up to knees   Radiology/Studies: DG Chest Port 1 View Result Date: 05/29/2023 CLINICAL DATA:  Mitral valve replacement. EXAM: PORTABLE CHEST 1 VIEW COMPARISON:  05/28/2023 FINDINGS: Low volume film. The cardio pericardial silhouette is enlarged. Bibasilar atelectasis with small bilateral pleural effusions, left greater than right. Interval removal of pulmonary artery catheter with right IJ sheath still in place. Midline mediastinal/pericardial drains again noted. IMPRESSION: Low volume film with bibasilar atelectasis and small bilateral pleural effusions, left greater than right. Electronically Signed   By: Kennith Center M.D.   On: 05/29/2023 08:24   ECHO INTRAOPERATIVE TEE Result Date: 05/28/2023  *INTRAOPERATIVE TRANSESOPHAGEAL REPORT *  Patient Name:   [name] [name] Date of Exam: 05/27/2023 Medical Rec #:  [id number]      Height:       70.0 in Accession #:    [id number]     Weight:       205.0 lb Date of Birth:  07/20/1945       BSA:          2.11 m Patient Age:    78 years       BP:           153/93 mmHg Patient Gender: M              HR:           84 bpm. Exam Location:  Inpatient Transesophogeal exam was perform  intraoperatively during surgical procedure. Patient was closely monitored under general anesthesia during the entirety of examination. Indications:     Mitral and tricuspid valve abnormaility Performing Phys: [id number] Eugenio Hoes Diagnosing Phys: Earl Lites Stoltzfus Complications: No known complications during this procedure. POST-OP IMPRESSIONS _ Left Ventricle: The left ventricle is unchanged from pre-bypass. _ Right Ventricle: The right ventricle appears unchanged from pre-bypass. _ Aorta: The aorta appears unchanged from pre-bypass. _ Left Atrial Appendage: A is completely occluded device was placed. _ Aortic Valve: The aortic valve appears unchanged from pre-bypass. There is mild regurgitation. _ Mitral Valve: The gradient recorded across the prosthetic valve is within the expected range. The mitral valve is status post repair with an annuloplasty ring. _ Tricuspid Valve: The gradient recorded across the prosthetic valve is within the expected range. No perivalvular leak noted. _ Pulmonic Valve: The pulmonic valve appears unchanged from pre-bypass. _ Interatrial Septum: The interatrial septum appears unchanged from pre-bypass. _ Pericardium: The pericardium appears unchanged from pre-bypass. _ Comments: S/P MV repair with 32mm Simulus semi rigid band with P2 triangular resection, TV repair with a 34mm MC3 annuloplasty ring, MAZE for AF, LAA closure with 50mm Medtronic clip. Mildly worseing central AI post procedure from baseline. Both rings seated appropriately without leak or rock. Gradients WNL. EF 40-45% post procedure, emergence from CPB requiring epinephrine and phenylephrine support. PRE-OP FINDINGS  Left Ventricle: The left ventricle has mildly reduced systolic function, with an ejection fraction of 45-50%. The cavity size was normal. Left ventricular diastolic function could not be evaluated due to atrial fibrillation. Left ventricular diastolic function could not be evaluated. Right Ventricle: The right  ventricle has normal systolic function. The cavity was dialated. There is no increase in right ventricular wall thickness. Right ventricular systolic pressure is mildly elevated. Left Atrium: Left atrial size was dilated. No  left atrial/left atrial appendage thrombus was detected. There is echo contrast seen in the left atrial cavity. Left atrial appendage velocity is normal at greater than 40 cm/s. Right Atrium: Right atrial size was dilated. Interatrial Septum: No atrial level shunt detected by color flow Doppler. There is no evidence of a patent foramen ovale. Pericardium: There is no evidence of pericardial effusion. There is no pleural effusion. Mitral Valve: The mitral valve is myxomatous. Mitral valve regurgitation moderate to severe. The MR jet is anteriorly-directed. Pulmonary venous flow shows systolic flow reversal. Moderate prolapse of the mitral valve P2 cusp was present. PISA EROA .35cm2, RV 48cc, VC .63cm. Tricuspid Valve: The tricuspid valve was normal in structure. Tricuspid valve regurgitation is severe by color flow Doppler. The jet is directed toward the atrial septum. No evidence of tricuspid stenosis is present. Severely dilated annulus (5.05cm), S wave reversal hepatic veins. Aortic Valve: The aortic valve is tricuspid Aortic valve regurgitation is trivial by color flow Doppler. The jet is centrally-directed. There is no stenosis of the aortic valve, with a calculated valve area of 2.54 cm. There is no evidence of aortic valve vegetation. Pulmonic Valve: The pulmonic valve was normal in structure, with normal. No evidence of pumonic stenosis. Pulmonic valve regurgitation is trivial by color flow Doppler. Aorta: The ascending aorta and aortic root are normal in size and structure. Pulmonary Artery: Theone Murdoch catheter present on the right. The pulmonary artery is of normal size. Venous: The inferior vena cava measures 2.50 cm, is dilated in size with less than 50% respiratory variability,  suggesting right atrial pressure of 15 mmHg. Shunts: There is no evidence of an atrial septal defect. +--------------+--------++ LEFT VENTRICLE         +--------------+--------++ PLAX 2D                +--------------+--------++ LVOT diam:    2.30 cm  +--------------+--------++ LVOT Area:    4.15 cm +--------------+--------++                        +--------------+--------++ +---------------+---------++ RIGHT VENTRICLE          +---------------+---------++ RVSP:          41.4 mmHg +---------------+---------++ +------------+----------+++ RIGHT ATRIUM           +------------+----------+++ RA Pressure:15.00 mmHg +------------+----------+++  +------------------+-----------++ AORTIC VALVE                  +------------------+-----------++ AV Area (Vmax):   2.60 cm    +------------------+-----------++ AV Area (Vmean):  2.29 cm    +------------------+-----------++ AV Area (VTI):    2.54 cm    +------------------+-----------++ AV Vmax:          85.60 cm/s  +------------------+-----------++ AV Vmean:         60.800 cm/s +------------------+-----------++ AV VTI:           0.165 m     +------------------+-----------++ AV Peak Grad:     2.9 mmHg    +------------------+-----------++ AV Mean Grad:     2.0 mmHg    +------------------+-----------++ LVOT Vmax:        53.50 cm/s  +------------------+-----------++ LVOT Vmean:       33.500 cm/s +------------------+-----------++ LVOT VTI:         0.101 m     +------------------+-----------++ LVOT/AV VTI ratio:0.61        +------------------+-----------++  +--------------+-------++ AORTA                 +--------------+-------++  Ao Sinus diam:4.20 cm +--------------+-------++ Ao STJ diam:  3.6 cm  +--------------+-------++ Ao Asc diam:  4.50 cm +--------------+-------++ +-------------+---------++       +---------------+-----------++ MITRAL VALVE                 TRICUSPID  VALVE            +-------------+---------++       +---------------+-----------++ MV Peak grad:3.2 mmHg        TR Peak grad:  26.4 mmHg   +-------------+---------++       +---------------+-----------++ MV Mean grad:1.0 mmHg        TR Mean grad:  17.0 mmHg   +-------------+---------++       +---------------+-----------++ MV Vmax:     0.90 m/s        TR Vmax:       257.00 cm/s +-------------+---------++       +---------------+-----------++ MV Vmean:    48.1 cm/s       TR Vmean:      191.0 cm/s  +-------------+---------++       +---------------+-----------++ MV VTI:      0.12 m          Estimated RAP: 15.00 mmHg  +-------------+---------++       +---------------+-----------++ +----------------+-----------++  RVSP:          41.4 mmHg   MR Peak grad:   66.6 mmHg    +---------------+-----------++ +----------------+-----------++ MR Mean grad:   46.0 mmHg    +--------------+-------+ +----------------+-----------++  SHUNTS                MR Vmax:        408.00 cm/s  +--------------+-------+ +----------------+-----------++  Systemic VTI: 0.10 m  MR Vmean:       316.0 cm/s   +--------------+-------+ +----------------+-----------++  Systemic Diam:2.30 cm MR PISA:        4.02 cm     +--------------+-------+ +----------------+-----------++ MR PISA Eff ROA:35 mm      +----------------+-----------++ MR PISA Radius: 0.80 cm     +----------------+-----------++ +---------+-------+ IVC              +---------+-------+ IVC diam:2.50 cm +---------+-------+  [name] [name] Electronically signed by [name] [name] Signature Date/Time: 05/28/2023/5:30:13 PM    Final    DG Chest Port 1 View Result Date: 05/28/2023 CLINICAL DATA:  Mitral valve repair, tricuspid valve repair EXAM: PORTABLE CHEST - 1 VIEW COMPARISON:  05/27/2023 FINDINGS: Patient has been extubated and the gastric tube removed. Right IJ Swan-Ganz loops near the pulmonary artery  bifurcation. Mediastinal drain remains in place. Bibasilar opacities left greater than right, partially improved. Heart size and mediastinal contours are within normal limits. No definite effusion. Sternotomy wires. IMPRESSION: 1. Interval extubation. 2. Improving bibasilar opacities. Electronically Signed   By: Corlis Leak M.D.   On: 05/28/2023 10:18   DG Chest Port 1 View Result Date: 05/27/2023 CLINICAL DATA:  Status post mitral valve repair. EXAM: PORTABLE CHEST 1 VIEW COMPARISON:  Chest radiograph dated 05/23/2023. FINDINGS: Endotracheal tube approximately 4.5 cm above the carina. Enteric tube extends below the diaphragm with tip beyond the inferior margin of the image. Right IJ Swan-Ganz catheter with tip projecting over the left hilum, likely in the right pulmonary artery. Bilateral streaky atelectasis. Consolidative changes of the left lung base may represent atelectasis. Pneumonia is not excluded no pneumothorax. Stable cardiomegaly. Cardiac valve repair. No acute osseous pathology. IMPRESSION: 1. Support apparatus as above. 2. Bilateral atelectasis. Electronically Signed   By: Ceasar Mons.D.  On: 05/27/2023 15:41   DG Chest 2 View Result Date: 05/26/2023 CLINICAL DATA:  Preop chest exam. Nonrheumatic mitral valve regurgitation. Tricuspid valve insufficiency. Upcoming mitral valve repair. EXAM: CHEST - 2 VIEW COMPARISON:  Radiograph 06/26/2007, CT 04/23/2023 FINDINGS: The heart is enlarged. Mild aortic tortuosity. No focal airspace disease, pleural effusion, or pneumothorax. Normal pulmonary vasculature. Mild thoracic spondylosis. Lower cervical spine hardware partially included in the field of view. IMPRESSION: Unchanged cardiomegaly.  No acute findings. Electronically Signed   By: Narda Rutherford M.D.   On: 05/26/2023 22:33   VAS US CAROTID Result Date: 05/23/2023 Carotid Arterial Duplex Study Patient Name:  [name] [name]  Date of Exam:   05/23/2023 Medical Rec #: [id number]        Accession #:    [id number] Date of Birth: 1946/02/01        Patient Gender: M Patient Age:   66 years Exam Location:  [facility] Procedure:      VAS US CAROTID Referring Phys: Eugenio Hoes --------------------------------------------------------------------------------  Indications:       Severe mitral regurgitation [829562], Tricuspid regurgitation                    [206723], A-fib (HCC) [130865]. Risk Factors:      Hypertension. Comparison Study:  No prior studies. Performing Technologist: Chanda Busing RVT  Examination Guidelines: A complete evaluation includes B-mode imaging, spectral Doppler, color Doppler, and power Doppler as needed of all accessible portions of each vessel. Bilateral testing is considered an integral part of a complete examination. Limited examinations for reoccurring indications may be performed as noted.  Right Carotid Findings: +----------+--------+--------+--------+-----------------------+--------+           PSV cm/sEDV cm/sStenosisPlaque Description     Comments +----------+--------+--------+--------+-----------------------+--------+ CCA Prox  71      15              smooth and heterogenous         +----------+--------+--------+--------+-----------------------+--------+ CCA Distal74      22              smooth and heterogenous         +----------+--------+--------+--------+-----------------------+--------+ ICA Prox  77      19              smooth and heterogenous         +----------+--------+--------+--------+-----------------------+--------+ ICA Mid   36      19                                              +----------+--------+--------+--------+-----------------------+--------+ ICA Distal71      36                                     tortuous +----------+--------+--------+--------+-----------------------+--------+ ECA       72      20                                               +----------+--------+--------+--------+-----------------------+--------+ +----------+--------+-------+--------+-------------------+           PSV cm/sEDV cmsDescribeArm Pressure (mmHG) +----------+--------+-------+--------+-------------------+ [id number]                                         +----------+--------+-------+--------+-------------------+ +---------+--------+--+--------+--+---------+  VertebralPSV cm/s34EDV cm/s11Antegrade +---------+--------+--+--------+--+---------+  Left Carotid Findings: +----------+--------+--------+--------+-----------------------+--------+           PSV cm/sEDV cm/sStenosisPlaque Description     Comments +----------+--------+--------+--------+-----------------------+--------+ CCA Prox  91      17              smooth and heterogenous         +----------+--------+--------+--------+-----------------------+--------+ CCA Distal57      18              smooth and heterogenous         +----------+--------+--------+--------+-----------------------+--------+ ICA Prox  52      12                                              +----------+--------+--------+--------+-----------------------+--------+ ICA Mid   44      21                                     tortuous +----------+--------+--------+--------+-----------------------+--------+ ICA Distal50      15                                     tortuous +----------+--------+--------+--------+-----------------------+--------+ ECA       73      12                                              +----------+--------+--------+--------+-----------------------+--------+ +----------+--------+--------+--------+-------------------+           PSV cm/sEDV cm/sDescribeArm Pressure (mmHG) +----------+--------+--------+--------+-------------------+ [id number]                                          +----------+--------+--------+--------+-------------------+  +---------+--------+--+--------+--+---------+ VertebralPSV cm/s36EDV cm/s15Antegrade +---------+--------+--+--------+--+---------+   Summary: Right Carotid: Velocities in the right ICA are consistent with a 1-39% stenosis. Left Carotid: Velocities in the left ICA are consistent with a 1-39% stenosis. Vertebrals: Bilateral vertebral arteries demonstrate antegrade flow. *See table(s) above for measurements and observations.  Electronically signed by Carolynn Sayers on 05/23/2023 at 12:33:58 PM.    Final     EKG:(personally reviewed) 05/28/23 VP with pause vs LOC (unclear if this as during threshold testing), intrinsic QRS narrow   TELEMETRY: VP 68 bp (personally reviewed)  Pt set at VVI 68 bpm, 10 mA. Threshold 1mA at 35 bpm  DEVICE HISTORY:    Assessment/Plan:  Tachy-Brady Syndrome post MV/TV Repair, MAZE, LAA Occlusion  Hx AF - failed flecainide  Hx AFL s/p Ablation 2012  LVEF 55% in 02/2023, intra-op ECHO showed LVEF 45-50%  -amiodarone stopped 05/30/23 -avoid AV nodal blocking agents   -tele monitoring  -POD #3, observe conduction over weekend and re-evaluate on Monday for possible device needs -NPO after MN on Monday 2/24 for possible implant  -on Eliquis for AF, transient AKI > monitor for need for dose reduction given age but may need to transition to heparin for pre-procedure.  Ideally last dose of Eliquis on Saturday AM or Heparin infusion and stop on 2/24 at MN.  -assess limited ECHO given MVR,  pending EF, may need CRT-P  Thrombocytopenia  Anemia  -per CCM   For questions or updates, please contact [name] [name] Please consult [email or web address] for contact info under Cardiology/STEMI.  Signed, Canary Brim, NP-C, AGACNP-BC  [name] - Electrophysiology  05/30/2023, 10:34 AM  I have seen, examined the patient, and reviewed the above assessment and plan.    HPI: [name] [name] is an 78 y.o. male with a history of severe MR, moderate TR, HLD, PAF previously on  flecainide, AFL s/p ablation 2012 on Xarelto, CVA, chronic UTI's, prostate cancer s/p prostatectomy who underwent mitral valve repair, tricuspid valve repair, MAZE and LAAC on 2/18 with Dr. Leafy Ro. Post operatively was given amiodarone. Subsequently has been dependent on temporary pacing wires. Patient is POD#3 today. Reports some generalized discomfort and fatigue. Otherwise recovering well. No new or acute complaints today.  GEN: No acute distress.   Cardiac: Normal rate, regular rhythm. Resp: Normal work of breathing.  Psych: Normal affect   Limited Echo 05/30/23: 1. Left ventricular ejection fraction, by estimation, is 30 to 35%. The  left ventricle has moderately decreased function. The left ventricle  demonstrates global hypokinesis. There is mild concentric left ventricular  hypertrophy.   2. Right ventricular systolic function is moderately reduced. The right  ventricular size is moderately enlarged.   3. Left atrial size was moderately dilated.   4. Right atrial size was moderately dilated.   5. The mitral valve has been repaired/replaced.   6. The inferior vena cava is dilated in size with <50% respiratory  variability, suggesting right atrial pressure of 15 mmHg.   Assessment: [name] [name] is an 78 y.o. male with a history of severe MR, moderate TR, HLD, PAF previously on flecainide, AFL s/p ablation 2012 on Xarelto, CVA, chronic UTI's, prostate cancer s/p prostatectomy who underwent mitral valve repair, tricuspid valve repair, MAZE and LAAC on 2/18 with Dr. Leafy Ro. Had rapid rates immediately post op and was given amiodarone. Subsequently has been dependent on temporary epicardial pacing wires. Appears to have predominantly sinus node dysfunction in setting of MAZE and amiodarone. He is only POD#3. We will give him more time to assess for recovery of sinus node function. At time he does conduct with rapid rates with a narrow QRS, so suspect AVN function is intact. If he remains  pacer dependent on POD#5 then we will pursue permanent pacer implant. Limited post op echocardiogram showed EF 30-35% so patient would meet criteria for ICD.   Problem List:  Post operative sinus node dysfunction  Paroxysmal atrial fibrillation Atrial flutter s/p ablation S/p mitral repair, tricuspid repair, MAZE and LAAC  Plan:  - Continue to monitor for recovery of sinus node function. Turn down pacer daily to assess for underlying rhythm. Epicardial pacing wire threshold today was 1.0. - Refrain from additional amiodarone or metoprolol while awaiting recovery. - Keep NPO after midnight Sunday for possible implant on Monday.  - Can either hold Eliquis after Saturday morning or bridge with heparin and stop after midnight Sunday.   [name] Putnam, MD 05/30/2023 4:07 PM

## 2023-05-30 NOTE — Progress Notes (Signed)
[address]       [place] [address]             [phone]      3 Days Post-Op  Procedure(s) (LRB): MITRAL VALVE REPAIR USING MEDTRONIC SIMULUS SEMI-RIDGID ANNULOPLASTY BAND (MVR) (N/A) TRICUSPID VALVE REPAIR USING EDWARDS MC3 TRICUSPID ANNULOPLASTY RING (N/A) MAZE (N/A) TRANSESOPHAGEAL ECHOCARDIOGRAM (TEE) (N/A)   Total Length of Stay:  LOS: 3 days    SUBJECTIVE:SUBJECTIVE: Had some tachycardia this am but without apacing very slow with heart rate less than 30 ambulated   EXAM Lungs: overall clear Card: RR with soft murmur Ext: edematous Neuro: intact     ASSESSMENT: POD #3 SP MV repair/TV repair/MAZE Hemodynamics ok but with tachy brady issues will stop his amio and have EP evaluate Continue V pacing back up Needs more aggressive diuresis Will start elliquis after chest tubes out     Eugenio Hoes, MD 05/30/2023  Vitals:   05/30/23 0645 05/30/23 0700  BP: 114/76 (!) 72/63  Pulse: 95 (!) 118  Resp: 17 (!) 21  Temp:    SpO2: 92% 94%    Intake/Output      02/20 0701 02/21 0700 02/21 0701 02/22 0700   I.V. (mL/kg) 28.4 (0.3)    IV Piggyback     Total Intake(mL/kg) 28.4 (0.3)    Urine (mL/kg/hr) 1370 (0.6)    Stool 0    Chest Tube 220    Total Output 1590    Net -1561.6         Stool Occurrence 1 x        albumin human Stopped (05/28/23 0700)   phenylephrine (NEO-SYNEPHRINE) Adult infusion Stopped (05/29/23 0926)    CBC    Component Value Date/Time   WBC 15.1 (H) 05/29/2023 0500   RBC 3.53 (L) 05/29/2023 0500   HGB 10.8 (L) 05/29/2023 0500   HGB 14.6 02/06/2023 1117   HCT 33.3 (L) 05/29/2023 0500   HCT 44.8 02/06/2023 1117   PLT 100 (L) 05/29/2023 0500   PLT 228 02/06/2023 1117   MCV 94.3 05/29/2023 0500   MCV 94 02/06/2023 1117   MCH 30.6 05/29/2023 0500   MCHC 32.4 05/29/2023 0500   RDW 13.7 05/29/2023 0500   RDW 12.1 02/06/2023 1117   LYMPHSABS 1,093 12/06/2022 0919   MONOABS 432 07/29/2016  0837   EOSABS 148 04/07/2023 0840   BASOSABS 30 04/07/2023 0840   CMP     Component Value Date/Time   NA 135 05/29/2023 0500   NA 146 (H) 03/10/2023 1419   K 4.4 05/29/2023 0500   CL 103 05/29/2023 0500   CO2 22 05/29/2023 0500   GLUCOSE 107 (H) 05/29/2023 0500   BUN 26 (H) 05/29/2023 0500   BUN 17 03/10/2023 1419   CREATININE 1.18 05/29/2023 0500   CREATININE 1.06 04/07/2023 0840   CALCIUM 8.1 (L) 05/29/2023 0500   PROT 6.7 05/23/2023 1108   ALBUMIN 3.7 05/23/2023 1108   AST 23 05/23/2023 1108   ALT 20 05/23/2023 1108   ALKPHOS 53 05/23/2023 1108   BILITOT 1.4 (H) 05/23/2023 1108   GFRNONAA >60 05/29/2023 0500   GFRNONAA 73 06/28/2020 0817   GFRAA 85 06/28/2020 0817   ABG    Component Value Date/Time   PHART 7.314 (L) 05/27/2023 1859   PCO2ART 41.6 05/27/2023 1859   PO2ART 62 (L) 05/27/2023 1859   HCO3 21.3 05/27/2023 1859   TCO2 24 05/28/2023 0115   ACIDBASEDEF 5.0 (H)  05/27/2023 1859   O2SAT 90 05/27/2023 1859   CBG (last 3)  Recent Labs    05/29/23 1704 05/29/23 2200 05/30/23 0638  GLUCAP 129* 105* 89

## 2023-05-31 ENCOUNTER — Inpatient Hospital Stay (HOSPITAL_COMMUNITY): Payer: Medicare Other

## 2023-05-31 DIAGNOSIS — Z9889 Other specified postprocedural states: Secondary | ICD-10-CM | POA: Diagnosis not present

## 2023-05-31 DIAGNOSIS — R739 Hyperglycemia, unspecified: Secondary | ICD-10-CM | POA: Diagnosis not present

## 2023-05-31 DIAGNOSIS — I4891 Unspecified atrial fibrillation: Secondary | ICD-10-CM | POA: Diagnosis not present

## 2023-05-31 DIAGNOSIS — I495 Sick sinus syndrome: Secondary | ICD-10-CM | POA: Diagnosis not present

## 2023-05-31 DIAGNOSIS — I340 Nonrheumatic mitral (valve) insufficiency: Secondary | ICD-10-CM | POA: Diagnosis not present

## 2023-05-31 DIAGNOSIS — D62 Acute posthemorrhagic anemia: Secondary | ICD-10-CM | POA: Diagnosis not present

## 2023-05-31 LAB — TYPE AND SCREEN
ABO/RH(D): A POS
ABO/RH(D): A POS
Antibody Screen: NEGATIVE
Antibody Screen: NEGATIVE

## 2023-05-31 LAB — CBC
HCT: 33.9 % — ABNORMAL LOW (ref 39.0–52.0)
Hemoglobin: 11 g/dL — ABNORMAL LOW (ref 13.0–17.0)
MCH: 30.3 pg (ref 26.0–34.0)
MCHC: 32.4 g/dL (ref 30.0–36.0)
MCV: 93.4 fL (ref 80.0–100.0)
Platelets: 148 10*3/uL — ABNORMAL LOW (ref 150–400)
RBC: 3.63 MIL/uL — ABNORMAL LOW (ref 4.22–5.81)
RDW: 13.2 % (ref 11.5–15.5)
WBC: 11.5 10*3/uL — ABNORMAL HIGH (ref 4.0–10.5)
nRBC: 0 % (ref 0.0–0.2)

## 2023-05-31 LAB — BASIC METABOLIC PANEL
Anion gap: 13 (ref 5–15)
BUN: 34 mg/dL — ABNORMAL HIGH (ref 8–23)
CO2: 28 mmol/L (ref 22–32)
Calcium: 8.6 mg/dL — ABNORMAL LOW (ref 8.9–10.3)
Chloride: 97 mmol/L — ABNORMAL LOW (ref 98–111)
Creatinine, Ser: 1.13 mg/dL (ref 0.61–1.24)
GFR, Estimated: >60 mL/min (ref >60)
Glucose, Bld: 114 mg/dL — ABNORMAL HIGH (ref 70–99)
Potassium: 4.1 mmol/L (ref 3.5–5.1)
Sodium: 138 mmol/L (ref 135–145)

## 2023-05-31 LAB — GLUCOSE, CAPILLARY
Glucose-Capillary: 123 mg/dL — ABNORMAL HIGH (ref 70–99)
Glucose-Capillary: 113 mg/dL — ABNORMAL HIGH (ref 70–99)
Glucose-Capillary: 91 mg/dL (ref 70–99)
Glucose-Capillary: 113 mg/dL — ABNORMAL HIGH (ref 70–99)

## 2023-05-31 LAB — COOXEMETRY PANEL
Carboxyhemoglobin: 1.5 % (ref 0.5–1.5)
Methemoglobin: 1 % (ref 0.0–1.5)
O2 Saturation: 63.4 %
Total hemoglobin: 11.7 g/dL — ABNORMAL LOW (ref 12.0–16.0)

## 2023-05-31 MED ORDER — ORAL CARE MOUTH RINSE: 15.0000 mL | OROMUCOSAL | Status: DC | PRN

## 2023-05-31 MED ORDER — CHLORHEXIDINE GLUCONATE 0.12 % MT SOLN: 15.0000 mL | Freq: Once | OROMUCOSAL | Status: DC

## 2023-05-31 MED ORDER — TEMAZEPAM 15 MG PO CAPS: 15.0000 mg | ORAL_CAPSULE | Freq: Once | ORAL | Status: DC | PRN

## 2023-05-31 MED ORDER — BISACODYL 5 MG PO TBEC: 5.0000 mg | DELAYED_RELEASE_TABLET | Freq: Once | ORAL | Status: AC

## 2023-05-31 MED ORDER — INSULIN ASPART 100 UNIT/ML IJ SOLN
0.0000 [IU] | Freq: Three times a day (TID) | INTRAMUSCULAR | Status: DC
  Administered 2023-05-31 – 2023-06-01 (×4): 2 [IU] via SUBCUTANEOUS
  Administered 2023-06-02: 8 [IU] via SUBCUTANEOUS
  Administered 2023-06-04 (×2): 2 [IU] via SUBCUTANEOUS
  Administered 2023-06-04: 8 [IU] via SUBCUTANEOUS
  Administered 2023-06-05 – 2023-06-07 (×6): 2 [IU] via SUBCUTANEOUS

## 2023-05-31 MED ORDER — METOPROLOL TARTRATE 12.5 MG HALF TABLET: 12.5000 mg | ORAL_TABLET | Freq: Once | ORAL | Status: DC

## 2023-05-31 MED ORDER — CHLORHEXIDINE GLUCONATE CLOTH 2 % EX PADS
6.0000 | MEDICATED_PAD | Freq: Once | CUTANEOUS | Status: AC
  Administered 2023-06-01: 6 via TOPICAL

## 2023-05-31 MED ORDER — HEPARIN (PORCINE) 25000 UT/250ML-% IV SOLN
1400.0000 [IU]/h | INTRAVENOUS | Status: AC
  Administered 2023-05-31 – 2023-06-01 (×2): 1400 [IU]/h via INTRAVENOUS
  Filled 2023-05-31 (×2): qty 250

## 2023-05-31 NOTE — Progress Notes (Signed)
 Progress Note  Patient Name: [name] [name] Date of Encounter: 05/31/2023  Primary Cardiologist: [name], MD   Subjective   No complaints.   Inpatient Medications    Scheduled Meds:  acetaminophen  1,000 mg Oral Q6H   Or   acetaminophen (TYLENOL) oral liquid 160 mg/5 mL  1,000 mg Per Tube Q6H   aspirin  81 mg Oral Daily   bisacodyl  10 mg Oral Daily   Or   bisacodyl  10 mg Rectal Daily   bisacodyl  5 mg Oral Once   [START ON 06/01/2023] chlorhexidine  15 mL Mouth/Throat Once   Chlorhexidine Gluconate Cloth  6 each Topical Daily   Chlorhexidine Gluconate Cloth  6 each Topical Once   And   [START ON 06/01/2023] Chlorhexidine Gluconate Cloth  6 each Topical Once   docusate sodium  200 mg Oral Daily   feeding supplement  237 mL Oral BID BM   furosemide  40 mg Intravenous Q12H   insulin aspart  0-24 Units Subcutaneous TID WC   insulin aspart  0-5 Units Subcutaneous QHS   [START ON 06/01/2023] metoprolol tartrate  12.5 mg Oral Once   pantoprazole  40 mg Oral Daily   sodium chloride flush  3 mL Intravenous Q12H   sodium chloride flush  3-10 mL Intravenous Q12H   Continuous Infusions:  albumin human Stopped (05/28/23 0700)   PRN Meds: albumin human, metoprolol tartrate, morphine injection, ondansetron (ZOFRAN) IV, mouth rinse, oxyCODONE, sodium chloride flush, sodium chloride flush, temazepam, traMADol   Vital Signs    Vitals:   05/31/23 0700 05/31/23 0800 05/31/23 0900 05/31/23 1000  BP: 101/75  (!) 110/91 125/76  Pulse: 90  97 93  Resp: (!) 21  16 17   Temp:  98.5 F (36.9 C)    TempSrc:  Oral    SpO2: 96%  96% 90%  Weight:      Height:        Intake/Output Summary (Last 24 hours) at 05/31/2023 1038 Last data filed at 05/31/2023 0600 Gross per 24 hour  Intake 10 ml  Output 3755 ml  Net -3745 ml   Filed Weights   05/28/23 0500 05/30/23 0500 05/31/23 0500  Weight: 98 kg 98.4 kg 96.4 kg    Telemetry    Atrial fib with a controlled VR.  - Personally  Reviewed  ECG    none - Personally Reviewed  Physical Exam   GEN: No acute distress.   Neck: No JVD Cardiac: IRIRR, no murmurs, rubs, or gallops.  Respiratory: Clear to auscultation bilaterally. GI: Soft, nontender, non-distended  MS: No edema; No deformity. Neuro:  Nonfocal  Psych: Normal affect   Labs    Chemistry Recent Labs  Lab 05/29/23 0500 05/30/23 0727 05/31/23 0537  NA 135 135 138  K 4.4 3.8 4.1  CL 103 99 97*  CO2 22 23 28   GLUCOSE 107* 145* 114*  BUN 26* 37* 34*  CREATININE 1.18 1.47* 1.13  CALCIUM 8.1* 8.0* 8.6*  GFRNONAA >60 48* >60  ANIONGAP 10 13 13      Hematology Recent Labs  Lab 05/29/23 0500 05/30/23 0727 05/31/23 0537  WBC 15.1* 10.8* 11.5*  RBC 3.53* 3.51* 3.63*  HGB 10.8* 10.5* 11.0*  HCT 33.3* 33.2* 33.9*  MCV 94.3 94.6 93.4  MCH 30.6 29.9 30.3  MCHC 32.4 31.6 32.4  RDW 13.7 13.6 13.2  PLT 100* 105* 148*    Cardiac EnzymesNo results for input(s): "TROPONINI" in the last 168 hours. No  results for input(s): "TROPIPOC" in the last 168 hours.   BNPNo results for input(s): "BNP", "PROBNP" in the last 168 hours.   DDimer No results for input(s): "DDIMER" in the last 168 hours.   Radiology    DG CHEST PORT 1 VIEW Result Date: 05/31/2023 CLINICAL DATA:  Status post mitral valve repair. EXAM: PORTABLE CHEST 1 VIEW COMPARISON:  05/29/2023 FINDINGS: Chest tube has been removed. Right jugular central line is still present. Again noted are postoperative changes in the heart with median sternotomy wires. Surgical plate in lower cervical spine. Negative for pneumothorax. Persistent densities in the lower chest bilaterally that could represent atelectasis and/or pleural fluid. Trachea remains midline. IMPRESSION: 1. Chest tube has been removed. Negative for pneumothorax. 2. Persistent densities in the lower chest bilaterally. Electronically Signed   By: Richarda Overlie M.D.   On: 05/31/2023 10:05   ECHOCARDIOGRAM LIMITED Result Date: 05/30/2023     ECHOCARDIOGRAM LIMITED REPORT   Patient Name:   [name] [name] Date of Exam: 05/30/2023 Medical Rec #:  [id number]      Height:       70.0 in Accession #:    [id number]     Weight:       216.9 lb Date of Birth:  September 15, 1945       BSA:          2.161 m Patient Age:    78 years       BP:           116/68 mmHg Patient Gender: M              HR:           70 bpm. Exam Location:  Inpatient Procedure: Transesophageal Echo (Both Spectral and Color Flow Doppler were            utilized during procedure). Indications:    EF  History:        Patient has prior history of Echocardiogram examinations, most                 recent 02/07/2023.  Sonographer:    Harriette Bouillon RDCS Referring Phys: Jeanella Craze IMPRESSIONS  1. Left ventricular ejection fraction, by estimation, is 30 to 35%. The left ventricle has moderately decreased function. The left ventricle demonstrates global hypokinesis. There is mild concentric left ventricular hypertrophy.  2. Right ventricular systolic function is moderately reduced. The right ventricular size is moderately enlarged.  3. Left atrial size was moderately dilated.  4. Right atrial size was moderately dilated.  5. The mitral valve has been repaired/replaced.  6. The inferior vena cava is dilated in size with <50% respiratory variability, suggesting right atrial pressure of 15 mmHg. Conclusion(s)/Recommendation(s): Limited echo to reassess LV and RV function. There has been a mitral valve repair. Valve not interrogated with Doppler on this study. FINDINGS  Left Ventricle: Left ventricular ejection fraction, by estimation, is 30 to 35%. The left ventricle has moderately decreased function. The left ventricle demonstrates global hypokinesis. There is mild concentric left ventricular hypertrophy. Abnormal (paradoxical) septal motion, consistent with RV pacemaker. Right Ventricle: The right ventricular size is moderately enlarged. Right ventricular systolic function is moderately reduced. Left Atrium:  Left atrial size was moderately dilated. Right Atrium: Right atrial size was moderately dilated. Pericardium: There is no evidence of pericardial effusion. Mitral Valve: The mitral valve has been repaired/replaced. There is a prosthetic annuloplasty ring present in the mitral position. Venous: The inferior vena cava is dilated in  size with less than 50% respiratory variability, suggesting right atrial pressure of 15 mmHg. LEFT VENTRICLE PLAX 2D LVIDd:         5.50 cm LVIDs:         3.70 cm LV PW:         1.30 cm LV IVS:        1.30 cm  RIGHT ATRIUM           Index RA Area:     22.10 cm RA Volume:   69.20 ml  32.03 ml/m Arvilla Meres MD Electronically signed by Arvilla Meres MD Signature Date/Time: 05/30/2023/2:55:56 PM    Final     Cardiac Studies   See above  Patient Profile     78 y.o. male admitted after MV/TV repair, MAZE now with post op sinus node dysfunction and AFib with RVR  Assessment & Plan    AFib with RVR - At this point if bp allows and rates go up, would still give low dose IV cardizem, despite intra-op EF45-50% and post op 35%. Also could give IV digoxin, though this will not work as quickly. Sinus node dysfunction - he will likely need single chamber AAI pacing.      For questions or updates, please contact [facility] Please consult [email or web address] for contact info under Cardiology/STEMI.      Signed, Lewayne Bunting, MD  05/31/2023, 10:38 AM

## 2023-05-31 NOTE — Progress Notes (Signed)
 ANTICOAGULATION CONSULT NOTE  Pharmacy Consult for heparin Indication: atrial fibrillation  No Known Allergies  Patient Measurements: Height: 5\' 10"  (177.8 cm) Weight: 96.4 kg (212 lb 8.4 oz) IBW/kg (Calculated) : 73 Heparin Dosing Weight: 92 kg  Vital Signs: Temp: 97.7 F (36.5 C) (02/22 1200) Temp Source: Oral (02/22 1200) BP: 131/81 (02/22 1600) Pulse Rate: 107 (02/22 1600)  Labs: Recent Labs    05/29/23 0500 05/30/23 0727 05/31/23 0537  HGB 10.8* 10.5* 11.0*  HCT 33.3* 33.2* 33.9*  PLT 100* 105* 148*  CREATININE 1.18 1.47* 1.13    Estimated Creatinine Clearance: 60.8 mL/min (by C-G formula based on SCr of 1.13 mg/dL).  Medical History: Past Medical History:  Diagnosis Date   Arthritis    Back   CVA (cerebral infarction) 07/14/2009   TIA   Dysrhythmia    A. Fib   Heart murmur    History of kidney stones    Hyperlipidemia    Hypertension    Nephrolithiasis    Paroxysmal atrial fibrillation (HCC)    Paroxysmal atrial flutter (HCC)    a. s/p ablation 2012.   Prostate cancer (HCC)    Stroke [facility])    TIA - no deficits    Medications:  See MAR  Assessment: 78 yo male admitted 2/17 for mitral and tricuspid valve repairs with MAZE and LAAC.  On Xarelto PTA for Afib, started on Eliquis postop 2/21 - last dose 2/21 PM.  EP consulted for tachy-brady syndrome with plans for possible device implant on Monday, 2/24.  Pharmacy consulted for heparin dosing.  CBC stable - Hgb 11, Plt 148.   Goal of Therapy:  Heparin level 0.3-0.7 units/ml Monitor platelets by anticoagulation protocol: Yes   Plan:  START heparin infusion 1400 units/hr, no bolus with recent DOAC 8 hour aPTT, anti-Xa level until correlating (don't anticipate they will before device implant) Daily CBC, anti-Xa level Monitor for s/sx of bleeding F/u heparin stop 2/24 at midnight per EP  Trixie Rude, PharmD Clinical Pharmacist 05/31/2023  4:09 PM

## 2023-05-31 NOTE — Progress Notes (Addendum)
 NAME:  [name] [name], MRN:  [id number], DOB:  07-23-1945, LOS: 4 ADMISSION DATE:  05/27/2023, CONSULTATION DATE:  2/18 REFERRING MD:  Leafy Ro, CHIEF COMPLAINT:  post op critical care support    History of Present Illness:  78 year old male w/ medical hx as outlined below. Followed by both cards and CVTS for mod/svr MR w/ progressive fatigue. Presented to [facility] 2/18 for elective MR/TR and MAZE Intra-op echo 45-50% EF, LV global hypokinesis, mildly elevated PAS,  RV mildly enlarged.  Bypass started 0852 ended 1118  cell saver  To CVICU post op  PCCM asked to assist w/ post op care  Pertinent  Medical History  Severe MR, mod TR, HFmrEF (EF 55%), Dysrhythmia, HL, PAF s/p ablation 2012 (on xarelto), Prior CVA,chronic UTIs, prostate cancer s/p prostatectomy  Significant Hospital Events: Including procedures, antibiotic start and stop dates in addition to other pertinent events    2/18 MAZE, MV repair, TV repair, extubated overnight  Interim History / Subjective:  Still has minimal energy. DDD paced overnight. Denies complaints. Still having BM.  Objective   Blood pressure 106/77, pulse 88, temperature 98.2 F (36.8 C), temperature source Oral, resp. rate 17, height 5\' 10"  (1.778 m), weight 96.4 kg, SpO2 100%. CVP:  [3 mmHg-63 mmHg] 10 mmHg      Intake/Output Summary (Last 24 hours) at 05/31/2023 0723 Last data filed at 05/31/2023 0600 Gross per 24 hour  Intake 10 ml  Output 4080 ml  Net -4070 ml   Filed Weights   05/28/23 0500 05/30/23 0500 05/31/23 0500  Weight: 98 kg 98.4 kg 96.4 kg    Examination: General: elderly man lying in bed in NAD, fatigued appearing HENT: [place]/AT, eyes anicteric lungs: breathing comfortably on [place], basilar rhales, no conversational dyspnea Cardiovascular: S1S2, RRR, intermittently paced abdomen: soft, NT, mildly distended Extremities: ++edema, no cyanosis Neuro: awake, alert, moving all extremities GU: Clear yellow urine  CXR: bilateral dependent  effusion  BUN 34 Cr 1.13 WBC 11.5 H/H 11/ 33.9 Platelets 148 Coox 63 %  I/O -4L  Echo: LVEF 30-35%, global hypokinesis. Moderately reduced RV function, LA &RA moderately dilated. IVC dilated with reduced respiratory variability.   Resolved Hospital Problem list     Assessment & Plan:  H/o Severe mitral regurgitation, tricuspid regurgitation, Afib, s/p MV and TV repair w/ MAZE on 2/18 Acute HFrEF post-op Post-op Afib, sinus node dysfunction with tachy-brady syndrome -post-op care per TCTS -con't aggressive diuresis -Appreciate EP's management; may need atrial pacing. -holding amiodarone -aspirin, statin -on low dose Bblocker -tele monitoring -progress mobility as able -serial coox  Hyperglycemia; h/o pre-DM. A1c 5.8 -SSI, not needing much -goal BG<180  AKI, resolved -con't diuresis -renally dose meds, avoid nephrotoxic meds  ABLA- expected post-op Consumptive thrombocytopenia- expected post-op, recovering -no current need for transfusion  H/o HTN & HFrEF  -Con't holding PTA losartan, toprol XL -not requiring antihypertensives currently  Chronic pain -con't PTA tramadol  Best Practice (right click and "Reselect all SmartList Selections" daily)   Diet/type: Regular consistency (see orders) DVT prophylaxis SCD Pressure ulcer(s): N/A GI prophylaxis: PPI Lines: Central line Foley:  Yes, and it is still needed Code Status:  full code Last date of multidisciplinary goals of care discussion [per primary ]  Labs   CBC: Recent Labs  Lab 05/28/23 0310 05/28/23 1818 05/29/23 0500 05/30/23 0727 05/31/23 0537  WBC 14.3* 16.4* 15.1* 10.8* 11.5*  HGB 10.4* 10.9* 10.8* 10.5* 11.0*  HCT 32.0* 33.2* 33.3* 33.2* 33.9*  MCV 93.6  94.3 94.3 94.6 93.4  PLT 110* 99* 100* 105* 148*    Basic Metabolic Panel: Recent Labs  Lab 05/27/23 1811 05/27/23 1859 05/27/23 2044 05/28/23 0115 05/28/23 0310 05/28/23 1818 05/29/23 0500 05/30/23 0727 05/31/23 0537  NA 148*    < > 137   < > 136 136 135 135 138  K 3.8   < > 4.0   < > 4.1 4.6 4.4 3.8 4.1  CL 110  --  106   < > 106 105 103 99 97*  CO2 21*  --  20*  --  20* 21* 22 23 28   GLUCOSE 151*  --  220*   < > 138* 150* 107* 145* 114*  BUN 14  --  14   < > 14 21 26* 37* 34*  CREATININE 1.34*  --  1.11   < > 1.08 1.24 1.18 1.47* 1.13  CALCIUM 8.0*  --  7.5*  --  7.8* 7.9* 8.1* 8.0* 8.6*  MG 3.3*  --  2.7*  --  2.4 2.3 2.5*  --   --    < > = values in this interval not displayed.   GFR: Estimated Creatinine Clearance: 60.8 mL/min (by C-G formula based on SCr of 1.13 mg/dL). Recent Labs  Lab 05/28/23 1818 05/29/23 0500 05/30/23 0727 05/31/23 0537  WBC 16.4* 15.1* 10.8* 11.5*        Critical care time:       Steffanie Dunn, DO 05/31/23 9:07 AM [facility]  For contact information, see Amion. If no response to pager, please call PCCM consult pager. After hours, 7PM- 7AM, please call Elink.

## 2023-05-31 NOTE — Plan of Care (Signed)
  Problem: Education: Goal: Knowledge of General Education information will improve Description: Including pain rating scale, medication(s)/side effects and non-pharmacologic comfort measures Outcome: Progressing    Problem: Health Behavior/Discharge Planning: Goal: Ability to manage health-related needs will improve Outcome: Progressing   Problem: Clinical Measurements: Goal: Ability to maintain clinical measurements within normal limits will improve Outcome: Progressing Goal: Will remain free from infection Outcome: Progressing Goal: Diagnostic test results will improve Outcome: Progressing Goal: Respiratory complications will improve Outcome: Progressing Goal: Cardiovascular complication will be avoided Outcome: Progressing   Problem: Activity: Goal: Risk for activity intolerance will decrease Outcome: Progressing   Problem: Nutrition: Goal: Adequate nutrition will be maintained Outcome: Progressing   Problem: Coping: Goal: Level of anxiety will decrease Outcome: Progressing   Problem: Elimination: Goal: Will not experience complications related to bowel motility Outcome: Progressing Goal: Will not experience complications related to urinary retention Outcome: Progressing   Problem: Pain Managment: Goal: General experience of comfort will improve and/or be controlled Outcome: Progressing   Problem: Safety: Goal: Ability to remain free from injury will improve Outcome: Progressing   Problem: Skin Integrity: Goal: Risk for impaired skin integrity will decrease Outcome: Progressing   Problem: Education: Goal: Will demonstrate proper wound care and an understanding of methods to prevent future damage Outcome: Progressing Goal: Knowledge of disease or condition will improve Outcome: Progressing Goal: Knowledge of the prescribed therapeutic regimen will improve Outcome: Progressing   Problem: Cardiac: Goal: Will achieve and/or maintain hemodynamic  stability Outcome: Progressing   Problem: Respiratory: Goal: Respiratory status will improve Outcome: Progressing   Problem: Skin Integrity: Goal: Wound healing without signs and symptoms of infection Outcome: Progressing Goal: Risk for impaired skin integrity will decrease Outcome: Progressing

## 2023-05-31 NOTE — Progress Notes (Signed)
 [address]       [place] [address]             [phone]      4 Days Post-Op  Procedure(s) (LRB): MITRAL VALVE REPAIR USING MEDTRONIC SIMULUS SEMI-RIDGID ANNULOPLASTY BAND (MVR) (N/A) TRICUSPID VALVE REPAIR USING EDWARDS MC3 TRICUSPID ANNULOPLASTY RING (N/A) MAZE (N/A) TRANSESOPHAGEAL ECHOCARDIOGRAM (TEE) (N/A)   Total Length of Stay:  LOS: 4 days    SUBJECTIVE: Still feels weak No appetite   Vitals:   05/31/23 0630 05/31/23 0700  BP:  101/75  Pulse: 88 90  Resp: 17 (!) 21  Temp:    SpO2: 100% 96%    Intake/Output      02/21 0701 02/22 0700 02/22 0701 02/23 0700   I.V. (mL/kg) 10 (0.1)    Total Intake(mL/kg) 10 (0.1)    Urine (mL/kg/hr) 4080 (1.8)    Stool 0    Chest Tube     Total Output 4080    Net -4070         Stool Occurrence 1 x        albumin human Stopped (05/28/23 0700)    CBC    Component Value Date/Time   WBC 11.5 (H) 05/31/2023 0537   RBC 3.63 (L) 05/31/2023 0537   HGB 11.0 (L) 05/31/2023 0537   HGB 14.6 02/06/2023 1117   HCT 33.9 (L) 05/31/2023 0537   HCT 44.8 02/06/2023 1117   PLT 148 (L) 05/31/2023 0537   PLT 228 02/06/2023 1117   MCV 93.4 05/31/2023 0537   MCV 94 02/06/2023 1117   MCH 30.3 05/31/2023 0537   MCHC 32.4 05/31/2023 0537   RDW 13.2 05/31/2023 0537   RDW 12.1 02/06/2023 1117   LYMPHSABS 1,093 12/06/2022 0919   MONOABS 432 07/29/2016 0837   EOSABS 148 04/07/2023 0840   BASOSABS 30 04/07/2023 0840   CMP     Component Value Date/Time   NA 138 05/31/2023 0537   NA 146 (H) 03/10/2023 1419   K 4.1 05/31/2023 0537   CL 97 (L) 05/31/2023 0537   CO2 28 05/31/2023 0537   GLUCOSE 114 (H) 05/31/2023 0537   BUN 34 (H) 05/31/2023 0537   BUN 17 03/10/2023 1419   CREATININE 1.13 05/31/2023 0537   CREATININE 1.06 04/07/2023 0840   CALCIUM 8.6 (L) 05/31/2023 0537   PROT 6.7 05/23/2023 1108   ALBUMIN 3.7 05/23/2023 1108   AST 23 05/23/2023 1108   ALT 20 05/23/2023 1108   ALKPHOS  53 05/23/2023 1108   BILITOT 1.4 (H) 05/23/2023 1108   GFRNONAA >60 05/31/2023 0537   GFRNONAA 73 06/28/2020 0817   GFRAA 85 06/28/2020 0817   ABG    Component Value Date/Time   PHART 7.314 (L) 05/27/2023 1859   PCO2ART 41.6 05/27/2023 1859   PO2ART 62 (L) 05/27/2023 1859   HCO3 21.3 05/27/2023 1859   TCO2 24 05/28/2023 0115   ACIDBASEDEF 5.0 (H) 05/27/2023 1859   O2SAT 63.4 05/31/2023 0740   CBG (last 3)  Recent Labs    05/30/23 1606 05/30/23 2212 05/31/23 0604  GLUCAP 87 111* 123*  EXAM Lungs: decreased at bases Card: IRR with no sign murmur Ext: edema improved Neuro: intact   ASSESSMENT: POD #4 SP MV/TV repair and full MAZE Hemodynamics ok with now rate controlled afib. Has been off amio and bb. Will put to VVI back up for now and discuss with EP timing of restarting amio DC cordis Diuresis today Ambulate  Hold on anticoagulation today and restart eliquis tomorrow if no rhythm issues   Eugenio Hoes, MD 05/31/2023

## 2023-06-01 DIAGNOSIS — I4891 Unspecified atrial fibrillation: Secondary | ICD-10-CM | POA: Diagnosis not present

## 2023-06-01 DIAGNOSIS — I495 Sick sinus syndrome: Secondary | ICD-10-CM | POA: Diagnosis not present

## 2023-06-01 DIAGNOSIS — Z9889 Other specified postprocedural states: Secondary | ICD-10-CM | POA: Diagnosis not present

## 2023-06-01 LAB — POCT I-STAT 7, (LYTES, BLD GAS, ICA,H+H)
Acid-Base Excess: 9 mmol/L — ABNORMAL HIGH (ref 0.0–2.0)
Bicarbonate: 32.8 mmol/L — ABNORMAL HIGH (ref 20.0–28.0)
Calcium, Ion: 1.14 mmol/L — ABNORMAL LOW (ref 1.15–1.40)
HCT: 34 % — ABNORMAL LOW (ref 39.0–52.0)
Hemoglobin: 11.6 g/dL — ABNORMAL LOW (ref 13.0–17.0)
O2 Saturation: 94 %
Patient temperature: 37
Potassium: 3.2 mmol/L — ABNORMAL LOW (ref 3.5–5.1)
Sodium: 132 mmol/L — ABNORMAL LOW (ref 135–145)
TCO2: 34 mmol/L — ABNORMAL HIGH (ref 22–32)
pCO2 arterial: 41.1 mm[Hg] (ref 32–48)
pH, Arterial: 7.51 — ABNORMAL HIGH (ref 7.35–7.45)
pO2, Arterial: 63 mm[Hg] — ABNORMAL LOW (ref 83–108)

## 2023-06-01 LAB — CBC
HCT: 32.3 % — ABNORMAL LOW (ref 39.0–52.0)
HCT: 32.8 % — ABNORMAL LOW (ref 39.0–52.0)
Hemoglobin: 10.8 g/dL — ABNORMAL LOW (ref 13.0–17.0)
Hemoglobin: 11 g/dL — ABNORMAL LOW (ref 13.0–17.0)
MCH: 30.3 pg (ref 26.0–34.0)
MCH: 30.3 pg (ref 26.0–34.0)
MCHC: 33.4 g/dL (ref 30.0–36.0)
MCHC: 33.5 g/dL (ref 30.0–36.0)
MCV: 90.5 fL (ref 80.0–100.0)
MCV: 90.4 fL (ref 80.0–100.0)
Platelets: 170 10*3/uL (ref 150–400)
Platelets: 182 10*3/uL (ref 150–400)
RBC: 3.57 MIL/uL — ABNORMAL LOW (ref 4.22–5.81)
RBC: 3.63 MIL/uL — ABNORMAL LOW (ref 4.22–5.81)
RDW: 13.1 % (ref 11.5–15.5)
RDW: 13.2 % (ref 11.5–15.5)
WBC: 9.6 10*3/uL (ref 4.0–10.5)
WBC: 9.9 10*3/uL (ref 4.0–10.5)
nRBC: 0 % (ref 0.0–0.2)
nRBC: 0 % (ref 0.0–0.2)

## 2023-06-01 LAB — BASIC METABOLIC PANEL
Anion gap: 13 (ref 5–15)
Anion gap: 10 (ref 5–15)
BUN: 30 mg/dL — ABNORMAL HIGH (ref 8–23)
BUN: 30 mg/dL — ABNORMAL HIGH (ref 8–23)
CO2: 28 mmol/L (ref 22–32)
CO2: 31 mmol/L (ref 22–32)
Calcium: 8.2 mg/dL — ABNORMAL LOW (ref 8.9–10.3)
Calcium: 7.9 mg/dL — ABNORMAL LOW (ref 8.9–10.3)
Chloride: 92 mmol/L — ABNORMAL LOW (ref 98–111)
Chloride: 92 mmol/L — ABNORMAL LOW (ref 98–111)
Creatinine, Ser: 1 mg/dL (ref 0.61–1.24)
Creatinine, Ser: 0.98 mg/dL (ref 0.61–1.24)
GFR, Estimated: >60 mL/min (ref >60)
GFR, Estimated: >60 mL/min (ref >60)
Glucose, Bld: 109 mg/dL — ABNORMAL HIGH (ref 70–99)
Glucose, Bld: 113 mg/dL — ABNORMAL HIGH (ref 70–99)
Potassium: 3.1 mmol/L — ABNORMAL LOW (ref 3.5–5.1)
Potassium: 3.2 mmol/L — ABNORMAL LOW (ref 3.5–5.1)
Sodium: 133 mmol/L — ABNORMAL LOW (ref 135–145)
Sodium: 133 mmol/L — ABNORMAL LOW (ref 135–145)

## 2023-06-01 LAB — APTT
aPTT: 88 s — ABNORMAL HIGH (ref 24–36)
aPTT: 76 s — ABNORMAL HIGH (ref 24–36)

## 2023-06-01 LAB — GLUCOSE, CAPILLARY
Glucose-Capillary: 149 mg/dL — ABNORMAL HIGH (ref 70–99)
Glucose-Capillary: 125 mg/dL — ABNORMAL HIGH (ref 70–99)
Glucose-Capillary: 114 mg/dL — ABNORMAL HIGH (ref 70–99)
Glucose-Capillary: 126 mg/dL — ABNORMAL HIGH (ref 70–99)

## 2023-06-01 LAB — HEPARIN LEVEL (UNFRACTIONATED): Heparin Unfractionated: >1.1 [IU]/mL — ABNORMAL HIGH (ref 0.30–0.70)

## 2023-06-01 MED ORDER — POTASSIUM CHLORIDE CRYS ER 20 MEQ PO TBCR
20.0000 meq | EXTENDED_RELEASE_TABLET | ORAL | Status: AC
  Administered 2023-06-01 (×3): 20 meq via ORAL
  Filled 2023-06-01 (×3): qty 1

## 2023-06-01 MED ORDER — ATORVASTATIN CALCIUM 10 MG PO TABS
20.0000 mg | ORAL_TABLET | Freq: Every day | ORAL | Status: DC
  Administered 2023-06-01 – 2023-06-09 (×9): 20 mg via ORAL
  Filled 2023-06-01 (×9): qty 2

## 2023-06-01 MED ORDER — SPIRONOLACTONE 12.5 MG HALF TABLET
12.5000 mg | ORAL_TABLET | Freq: Every day | ORAL | Status: DC
  Administered 2023-06-01 – 2023-06-09 (×9): 12.5 mg via ORAL
  Filled 2023-06-01 (×9): qty 1

## 2023-06-01 MED ORDER — SERTRALINE HCL 50 MG PO TABS
25.0000 mg | ORAL_TABLET | Freq: Every day | ORAL | Status: DC
  Administered 2023-06-01 – 2023-06-09 (×9): 25 mg via ORAL
  Filled 2023-06-01 (×9): qty 1

## 2023-06-01 NOTE — Plan of Care (Signed)
  Problem: Education: Goal: Knowledge of General Education information will improve Description: Including pain rating scale, medication(s)/side effects and non-pharmacologic comfort measures Outcome: Progressing   Problem: Health Behavior/Discharge Planning: Goal: Ability to manage health-related needs will improve Outcome: Progressing   Problem: Clinical Measurements: Goal: Ability to maintain clinical measurements within normal limits will improve Outcome: Progressing Goal: Will remain free from infection Outcome: Progressing Goal: Diagnostic test results will improve Outcome: Progressing Goal: Respiratory complications will improve Outcome: Progressing Goal: Cardiovascular complication will be avoided Outcome: Progressing   Problem: Activity: Goal: Risk for activity intolerance will decrease Outcome: Progressing   Problem: Nutrition: Goal: Adequate nutrition will be maintained Outcome: Progressing   Problem: Coping: Goal: Level of anxiety will decrease Outcome: Progressing   Problem: Elimination: Goal: Will not experience complications related to bowel motility Outcome: Progressing Goal: Will not experience complications related to urinary retention Outcome: Progressing   Problem: Pain Managment: Goal: General experience of comfort will improve and/or be controlled Outcome: Progressing   Problem: Safety: Goal: Ability to remain free from injury will improve Outcome: Progressing   Problem: Skin Integrity: Goal: Risk for impaired skin integrity will decrease Outcome: Progressing   Problem: Education: Goal: Will demonstrate proper wound care and an understanding of methods to prevent future damage Outcome: Progressing Goal: Knowledge of disease or condition will improve Outcome: Progressing Goal: Knowledge of the prescribed therapeutic regimen will improve Outcome: Progressing   Problem: Activity: Goal: Risk for activity intolerance will decrease Outcome:  Progressing   Problem: Cardiac: Goal: Will achieve and/or maintain hemodynamic stability Outcome: Progressing   Problem: Respiratory: Goal: Respiratory status will improve Outcome: Progressing   Problem: Skin Integrity: Goal: Wound healing without signs and symptoms of infection Outcome: Progressing Goal: Risk for impaired skin integrity will decrease Outcome: Progressing   Problem: Urinary Elimination: Goal: Ability to achieve and maintain adequate renal perfusion and functioning will improve Outcome: Progressing

## 2023-06-01 NOTE — Progress Notes (Addendum)
 Progress Note  Patient Name: [name] [name] Date of Encounter: 06/01/2023  Primary Cardiologist: [name], MD   Subjective   No complaints today except for residual fatigue. Inpatient Medications    Scheduled Meds:  acetaminophen  1,000 mg Oral Q6H   Or   acetaminophen (TYLENOL) oral liquid 160 mg/5 mL  1,000 mg Per Tube Q6H   aspirin  81 mg Oral Daily   bisacodyl  10 mg Oral Daily   Or   bisacodyl  10 mg Rectal Daily   bisacodyl  5 mg Oral Once   chlorhexidine  15 mL Mouth/Throat Once   Chlorhexidine Gluconate Cloth  6 each Topical Daily   Chlorhexidine Gluconate Cloth  6 each Topical Once   docusate sodium  200 mg Oral Daily   feeding supplement  237 mL Oral BID BM   furosemide  40 mg Intravenous Q12H   insulin aspart  0-24 Units Subcutaneous TID WC   insulin aspart  0-5 Units Subcutaneous QHS   pantoprazole  40 mg Oral Daily   potassium chloride  20 mEq Oral Q4H   sodium chloride flush  3 mL Intravenous Q12H   sodium chloride flush  3-10 mL Intravenous Q12H   Continuous Infusions:  albumin human Stopped (05/28/23 0700)   heparin 1,400 Units/hr (06/01/23 0700)   PRN Meds: albumin human, metoprolol tartrate, morphine injection, ondansetron (ZOFRAN) IV, mouth rinse, oxyCODONE, sodium chloride flush, sodium chloride flush, temazepam, traMADol   Vital Signs    Vitals:   06/01/23 0500 06/01/23 0600 06/01/23 0700 06/01/23 0800  BP: 102/62 117/86    Pulse: 72 98 80   Resp: 15 14 14    Temp:    (!) 97.5 F (36.4 C)  TempSrc:    Oral  SpO2: 93% 95% 95%   Weight: 92.2 kg     Height:        Intake/Output Summary (Last 24 hours) at 06/01/2023 0946 Last data filed at 06/01/2023 0700 Gross per 24 hour  Intake 459.28 ml  Output 2700 ml  Net -2240.72 ml   Filed Weights   05/30/23 0500 05/31/23 0500 06/01/23 0500  Weight: 98.4 kg 96.4 kg 92.2 kg    Telemetry    Afib with CHB, and rates in the mid 50's.  - Personally Reviewed  ECG    none - Personally  Reviewed  Physical Exam   GEN: awake and alert, answers questions appropriately.  Neck: No JVD Cardiac: IRRR, no murmurs, rubs, or gallops.  Respiratory: Clear to auscultation bilaterally. GI: Soft, nontender, non-distended  MS: No edema; No deformity. Neuro:  unresponsive on my exam today. Psych: Normal affect   Labs    Chemistry Recent Labs  Lab 05/30/23 0727 05/31/23 0537 06/01/23 0145  NA 135 138 133*  K 3.8 4.1 3.1*  CL 99 97* 92*  CO2 23 28 28   GLUCOSE 145* 114* 109*  BUN 37* 34* 30*  CREATININE 1.47* 1.13 1.00  CALCIUM 8.0* 8.6* 8.2*  GFRNONAA 48* >60 >60  ANIONGAP 13 13 13      Hematology Recent Labs  Lab 05/30/23 0727 05/31/23 0537 06/01/23 0145  WBC 10.8* 11.5* 9.6  RBC 3.51* 3.63* 3.57*  HGB 10.5* 11.0* 10.8*  HCT 33.2* 33.9* 32.3*  MCV 94.6 93.4 90.5  MCH 29.9 30.3 30.3  MCHC 31.6 32.4 33.4  RDW 13.6 13.2 13.1  PLT 105* 148* 170    Cardiac EnzymesNo results for input(s): "TROPONINI" in the last 168 hours. No results for input(s): "TROPIPOC"  in the last 168 hours.   BNPNo results for input(s): "BNP", "PROBNP" in the last 168 hours.   DDimer No results for input(s): "DDIMER" in the last 168 hours.   Radiology    DG CHEST PORT 1 VIEW Result Date: 05/31/2023 CLINICAL DATA:  Status post mitral valve repair. EXAM: PORTABLE CHEST 1 VIEW COMPARISON:  05/29/2023 FINDINGS: Chest tube has been removed. Right jugular central line is still present. Again noted are postoperative changes in the heart with median sternotomy wires. Surgical plate in lower cervical spine. Negative for pneumothorax. Persistent densities in the lower chest bilaterally that could represent atelectasis and/or pleural fluid. Trachea remains midline. IMPRESSION: 1. Chest tube has been removed. Negative for pneumothorax. 2. Persistent densities in the lower chest bilaterally. Electronically Signed   By: Richarda Overlie M.D.   On: 05/31/2023 10:05   ECHOCARDIOGRAM LIMITED Result Date:  05/30/2023    ECHOCARDIOGRAM LIMITED REPORT   Patient Name:   [name] [name] Date of Exam: 05/30/2023 Medical Rec #:  [id number]      Height:       70.0 in Accession #:    [id number]     Weight:       216.9 lb Date of Birth:  1945-05-23       BSA:          2.161 m Patient Age:    78 years       BP:           116/68 mmHg Patient Gender: M              HR:           70 bpm. Exam Location:  Inpatient Procedure: Transesophageal Echo (Both Spectral and Color Flow Doppler were            utilized during procedure). Indications:    EF  History:        Patient has prior history of Echocardiogram examinations, most                 recent 02/07/2023.  Sonographer:    Harriette Bouillon RDCS Referring Phys: Jeanella Craze IMPRESSIONS  1. Left ventricular ejection fraction, by estimation, is 30 to 35%. The left ventricle has moderately decreased function. The left ventricle demonstrates global hypokinesis. There is mild concentric left ventricular hypertrophy.  2. Right ventricular systolic function is moderately reduced. The right ventricular size is moderately enlarged.  3. Left atrial size was moderately dilated.  4. Right atrial size was moderately dilated.  5. The mitral valve has been repaired/replaced.  6. The inferior vena cava is dilated in size with <50% respiratory variability, suggesting right atrial pressure of 15 mmHg. Conclusion(s)/Recommendation(s): Limited echo to reassess LV and RV function. There has been a mitral valve repair. Valve not interrogated with Doppler on this study. FINDINGS  Left Ventricle: Left ventricular ejection fraction, by estimation, is 30 to 35%. The left ventricle has moderately decreased function. The left ventricle demonstrates global hypokinesis. There is mild concentric left ventricular hypertrophy. Abnormal (paradoxical) septal motion, consistent with RV pacemaker. Right Ventricle: The right ventricular size is moderately enlarged. Right ventricular systolic function is moderately reduced.  Left Atrium: Left atrial size was moderately dilated. Right Atrium: Right atrial size was moderately dilated. Pericardium: There is no evidence of pericardial effusion. Mitral Valve: The mitral valve has been repaired/replaced. There is a prosthetic annuloplasty ring present in the mitral position. Venous: The inferior vena cava is dilated in size with less than  50% respiratory variability, suggesting right atrial pressure of 15 mmHg. LEFT VENTRICLE PLAX 2D LVIDd:         5.50 cm LVIDs:         3.70 cm LV PW:         1.30 cm LV IVS:        1.30 cm  RIGHT ATRIUM           Index RA Area:     22.10 cm RA Volume:   69.20 ml  32.03 ml/m Arvilla Meres MD Electronically signed by Arvilla Meres MD Signature Date/Time: 05/30/2023/2:55:56 PM    Final     Cardiac Studies   See above  Patient Profile     78 y.o. male admitted with brain bleed and found to have CHB, now in afib with CHB and a controlled VR.  Assessment & Plan    Sinus node dysfunction after MAZE/MV/TV repair - he is back in afib. His rates are controlled.  Persistent atrial fib - question is whether to leave in afib and follow and possibly not require pacing or pursue NSR which will definitely require amio and pacing.  Coags -  continue IV heparin.     For questions or updates, please contact [facility] Please consult [email or web address] for contact info under Cardiology/STEMI.      Signed, Lewayne Bunting, MD  06/01/2023, 9:47 AM

## 2023-06-01 NOTE — Progress Notes (Signed)
 [address]       [place] [address]             [phone]      5 Days Post-Op  Procedure(s) (LRB): MITRAL VALVE REPAIR USING MEDTRONIC SIMULUS SEMI-RIDGID ANNULOPLASTY BAND (MVR) (N/A) TRICUSPID VALVE REPAIR USING EDWARDS MC3 TRICUSPID ANNULOPLASTY RING (N/A) MAZE (N/A) TRANSESOPHAGEAL ECHOCARDIOGRAM (TEE) (N/A)   Total Length of Stay:  LOS: 5 days    SUBJECTIVE: Feels stronger today Did not ambulate this am Has upper thigh rash  Vitals:   06/01/23 0600 06/01/23 0700  BP: 117/86   Pulse: 98 80  Resp: 14 14  Temp:    SpO2: 95% 95%    Intake/Output      02/22 0701 02/23 0700 02/23 0701 02/24 0700   P.O. 240    I.V. (mL/kg) 219.3 (2.4)    Total Intake(mL/kg) 459.3 (5)    Urine (mL/kg/hr) 2700 (1.2)    Stool 0    Total Output 2700    Net -2240.7         Urine Occurrence 1 x    Stool Occurrence 4 x        albumin human Stopped (05/28/23 0700)   heparin 1,400 Units/hr (06/01/23 0700)    CBC    Component Value Date/Time   WBC 9.6 06/01/2023 0145   RBC 3.57 (L) 06/01/2023 0145   HGB 10.8 (L) 06/01/2023 0145   HGB 14.6 02/06/2023 1117   HCT 32.3 (L) 06/01/2023 0145   HCT 44.8 02/06/2023 1117   PLT 170 06/01/2023 0145   PLT 228 02/06/2023 1117   MCV 90.5 06/01/2023 0145   MCV 94 02/06/2023 1117   MCH 30.3 06/01/2023 0145   MCHC 33.4 06/01/2023 0145   RDW 13.1 06/01/2023 0145   RDW 12.1 02/06/2023 1117   LYMPHSABS 1,093 12/06/2022 0919   MONOABS 432 07/29/2016 0837   EOSABS 148 04/07/2023 0840   BASOSABS 30 04/07/2023 0840   CMP     Component Value Date/Time   NA 133 (L) 06/01/2023 0145   NA 146 (H) 03/10/2023 1419   K 3.1 (L) 06/01/2023 0145   CL 92 (L) 06/01/2023 0145   CO2 28 06/01/2023 0145   GLUCOSE 109 (H) 06/01/2023 0145   BUN 30 (H) 06/01/2023 0145   BUN 17 03/10/2023 1419   CREATININE 1.00 06/01/2023 0145   CREATININE 1.06 04/07/2023 0840   CALCIUM 8.2 (L) 06/01/2023 0145   PROT 6.7  05/23/2023 1108   ALBUMIN 3.7 05/23/2023 1108   AST 23 05/23/2023 1108   ALT 20 05/23/2023 1108   ALKPHOS 53 05/23/2023 1108   BILITOT 1.4 (H) 05/23/2023 1108   GFRNONAA >60 06/01/2023 0145   GFRNONAA 73 06/28/2020 0817   GFRAA 85 06/28/2020 0817   ABG    Component Value Date/Time   PHART 7.314 (L) 05/27/2023 1859   PCO2ART 41.6 05/27/2023 1859   PO2ART 62 (L) 05/27/2023 1859   HCO3 21.3 05/27/2023 1859   TCO2 24 05/28/2023 0115   ACIDBASEDEF 5.0 (H) 05/27/2023 1859   O2SAT 63.4 05/31/2023 0740   CBG (last 3)  Recent Labs    05/31/23 1139 05/31/23 1607 05/31/23 2211  GLUCAP 113* 91 113*  EXAM Lungs: still decreased at bases Card: IRR with no murmur Ext: Warm still some edema Neuro: awake and intact   ASSESSMENT: SP MV/TV repair and full maze Has been back in afib with HR around 100. Will hold off on amio/bb  for now. Has pacing wires for back up. Need to discuss plan/timing of PPM if EP feels warranted and move to restart amio/bb. On Heparin Continue diuresis Ambulate   Eugenio Hoes, MD 06/01/2023

## 2023-06-01 NOTE — Progress Notes (Addendum)
 eLink Physician-Brief Progress Note Patient Name: [name] [name] DOB: 06-04-1945 MRN: [id number]   Date of Service  06/01/2023  HPI/Events of Note  Unable to draw pending co-ox. Pt only has PIV no CVC.   eICU Interventions  Discussed with RN, ordered ABG. Call back if abnormal.      Intervention Category Minor Interventions: Other:  Ranee Gosselin 06/01/2023, 10:02 PM  22:38 7.51/41/63/32  Hypokalemia on ABG. Had 3.2 from AM. Same.  Get Mag and potassium stat.

## 2023-06-01 NOTE — Progress Notes (Addendum)
 ANTICOAGULATION CONSULT NOTE Pharmacy Consult for heparin Indication: atrial fibrillation Brief A/P: aPTT within goal range Continue Heparin at current rate   No Known Allergies  Patient Measurements: Height: 5\' 10"  (177.8 cm) Weight: 96.4 kg (212 lb 8.4 oz) IBW/kg (Calculated) : 73 Heparin Dosing Weight: 92 kg  Vital Signs: Temp: 98.4 F (36.9 C) (02/23 0000) Temp Source: Oral (02/23 0000) BP: 114/77 (02/23 0000) Pulse Rate: 109 (02/23 0000)  Labs: Recent Labs    05/30/23 0727 05/31/23 0537 06/01/23 0145  HGB 10.5* 11.0* 10.8*  HCT 33.2* 33.9* 32.3*  PLT 105* 148* 170  APTT  --   --  88*  HEPARINUNFRC  --   --  >1.10*  CREATININE 1.47* 1.13 1.00    Estimated Creatinine Clearance: 68.7 mL/min (by C-G formula based on SCr of 1 mg/dL).  Assessment: 78 y.o. male with h/o Afib, Eliquis on hold, for heparin  Goal of Therapy:  aPTT 66-102 sec Heparin level 0.3-0.7 units/ml Monitor platelets by anticoagulation protocol: Yes   Plan:  No change to heparin  Geannie Risen, PharmD, BCPS  06/01/2023  2:49 AM   ADDENDUM 06/01/23 09:52 AM aPTT is therapeutic at 76 seconds on UFH IV infusion 1400 units/hour.  Continue UFH 1400 units/hour Monitor daily aPTT + HL, CBC, and signs of bleeding.

## 2023-06-01 NOTE — Progress Notes (Signed)
 NAME:  [name] [name], MRN:  [id number], DOB:  10-05-1945, LOS: 5 ADMISSION DATE:  05/27/2023, CONSULTATION DATE:  2/18 REFERRING MD:  Leafy Ro, CHIEF COMPLAINT:  post op critical care support    History of Present Illness:  78 year old male w/ medical hx as outlined below. Followed by both cards and CVTS for mod/svr MR w/ progressive fatigue. [facility]/18 for elective MR/TR and MAZE Intra-op echo 45-50% EF, LV global hypokinesis, mildly elevated PAS,  RV mildly enlarged.  Bypass started 0852 ended 1118  cell saver  To CVICU post op  PCCM asked to assist w/ post op care  Pertinent  Medical History  Severe MR, mod TR, HFmrEF (EF 55%), Dysrhythmia, HL, PAF s/p ablation 2012 (on xarelto), Prior CVA,chronic UTIs, prostate cancer s/p prostatectomy  Significant Hospital Events: Including procedures, antibiotic start and stop dates in addition to other pertinent events    2/18 MAZE, MV repair, TV repair, extubated overnight  Interim History / Subjective:  Feels a little better today, still low energy. Requesting meds for depression.  Objective   Blood pressure 117/86, pulse 80, temperature (!) 97.5 F (36.4 C), temperature source Oral, resp. rate 14, height 5\' 10"  (1.778 m), weight 92.2 kg, SpO2 95%.        Intake/Output Summary (Last 24 hours) at 06/01/2023 0932 Last data filed at 06/01/2023 0700 Gross per 24 hour  Intake 459.28 ml  Output 2700 ml  Net -2240.72 ml   Filed Weights   05/30/23 0500 05/31/23 0500 06/01/23 0500  Weight: 98.4 kg 96.4 kg 92.2 kg    Examination: General: elderly man lying in the recliner in NAD HENT: [place]/AT, eyes anicteric lungs: breathing comfortably on [place], reduced basilar breath sounds Cardiovascular: S1S2, tachycardic, irreg rhythm, HR in 110s abdomen: soft, NT Extremities: still significant edema, no cyanosis Neuro:  awake, alert, moving all extremities GU: clear yellow urine  Na+ 133 K+ 3.2 BUN 30 Cr 0.98 WBC 9.9 H/H   11/32.8 Platelets 182  I/O -2.2L   Resolved Hospital Problem list     Assessment & Plan:  H/o Severe mitral regurgitation, tricuspid regurgitation, Afib, s/p MV and TV repair w/ MAZE on 2/18 Acute HFrEF post-op Persistent Afib, sinus node dysfunction with tachy-brady syndrome post-MAZE -post-op care per TCTS -con't aggressive diuresis -appreciate EP's management; debating need for pacemaker vs leaving him in Afib -con't holding amiodarone for now -aspirin, statin -tele monitoring -progress mobility as he can tolerate -eventually needs GDMT as BP allows; still too soft for entresto or ARB. Add spiro today, which will help K+ -heparin for AC; holding apixaban while deciding what to do about PPM  Hyperglycemia; h/o pre-DM. A1c 5.8 -SSI PRN -goal BG 140-180  AKI, resolved -con't diuresis -renally dose meds, avoid nephrotoxic meds  ABLA- expected post-op Consumptive thrombocytopenia- expected post-op, recovering -no need for transfusion acutely  H/o HTN & HFrEF  -Con't holding PTA losartan and toprol XL due to hypotension  Chronic pain -con't PTA tramadol  Best Practice (right click and "Reselect all SmartList Selections" daily)   Diet/type: Regular consistency (see orders) DVT prophylaxis systemic heparin Pressure ulcer(s): N/A GI prophylaxis: PPI Lines: N/A Foley:  N/A Code Status:  full code Last date of multidisciplinary goals of care discussion [per primary ]  Labs   CBC: Recent Labs  Lab 05/28/23 1818 05/29/23 0500 05/30/23 0727 05/31/23 0537 06/01/23 0145  WBC 16.4* 15.1* 10.8* 11.5* 9.6  HGB 10.9* 10.8* 10.5* 11.0* 10.8*  HCT 33.2* 33.3* 33.2*  33.9* 32.3*  MCV 94.3 94.3 94.6 93.4 90.5  PLT 99* 100* 105* 148* 170    Basic Metabolic Panel: Recent Labs  Lab 05/27/23 1811 05/27/23 1859 05/27/23 2044 05/28/23 0115 05/28/23 0310 05/28/23 1818 05/29/23 0500 05/30/23 0727 05/31/23 0537 06/01/23 0145  NA 148*   < > 137   < > 136 136 135 135  138 133*  K 3.8   < > 4.0   < > 4.1 4.6 4.4 3.8 4.1 3.1*  CL 110  --  106   < > 106 105 103 99 97* 92*  CO2 21*  --  20*  --  20* 21* 22 23 28 28   GLUCOSE 151*  --  220*   < > 138* 150* 107* 145* 114* 109*  BUN 14  --  14   < > 14 21 26* 37* 34* 30*  CREATININE 1.34*  --  1.11   < > 1.08 1.24 1.18 1.47* 1.13 1.00  CALCIUM 8.0*  --  7.5*  --  7.8* 7.9* 8.1* 8.0* 8.6* 8.2*  MG 3.3*  --  2.7*  --  2.4 2.3 2.5*  --   --   --    < > = values in this interval not displayed.   GFR: Estimated Creatinine Clearance: 67.3 mL/min (by C-G formula based on SCr of 1 mg/dL). Recent Labs  Lab 05/29/23 0500 05/30/23 0727 05/31/23 0537 06/01/23 0145  WBC 15.1* 10.8* 11.5* 9.6        Critical care time:       Steffanie Dunn, DO 06/01/23 12:23 PM [facility]  For contact information, see Amion. If no response to pager, please call PCCM consult pager. After hours, 7PM- 7AM, please call Elink.

## 2023-06-02 ENCOUNTER — Inpatient Hospital Stay (HOSPITAL_COMMUNITY): Payer: Medicare Other

## 2023-06-02 DIAGNOSIS — R739 Hyperglycemia, unspecified: Secondary | ICD-10-CM | POA: Diagnosis not present

## 2023-06-02 DIAGNOSIS — I495 Sick sinus syndrome: Secondary | ICD-10-CM | POA: Diagnosis not present

## 2023-06-02 DIAGNOSIS — D62 Acute posthemorrhagic anemia: Secondary | ICD-10-CM | POA: Diagnosis not present

## 2023-06-02 DIAGNOSIS — Z9889 Other specified postprocedural states: Secondary | ICD-10-CM | POA: Diagnosis not present

## 2023-06-02 DIAGNOSIS — I340 Nonrheumatic mitral (valve) insufficiency: Secondary | ICD-10-CM | POA: Diagnosis not present

## 2023-06-02 LAB — CBC
HCT: 32.1 % — ABNORMAL LOW (ref 39.0–52.0)
Hemoglobin: 10.7 g/dL — ABNORMAL LOW (ref 13.0–17.0)
MCH: 30.1 pg (ref 26.0–34.0)
MCHC: 33.3 g/dL (ref 30.0–36.0)
MCV: 90.2 fL (ref 80.0–100.0)
Platelets: 209 10*3/uL (ref 150–400)
RBC: 3.56 MIL/uL — ABNORMAL LOW (ref 4.22–5.81)
RDW: 13.1 % (ref 11.5–15.5)
WBC: 11.2 10*3/uL — ABNORMAL HIGH (ref 4.0–10.5)
nRBC: 0 % (ref 0.0–0.2)

## 2023-06-02 LAB — GLUCOSE, CAPILLARY
Glucose-Capillary: 105 mg/dL — ABNORMAL HIGH (ref 70–99)
Glucose-Capillary: 115 mg/dL — ABNORMAL HIGH (ref 70–99)
Glucose-Capillary: 211 mg/dL — ABNORMAL HIGH (ref 70–99)
Glucose-Capillary: 100 mg/dL — ABNORMAL HIGH (ref 70–99)

## 2023-06-02 LAB — BASIC METABOLIC PANEL
Anion gap: 11 (ref 5–15)
Anion gap: 13 (ref 5–15)
BUN: 30 mg/dL — ABNORMAL HIGH (ref 8–23)
BUN: 30 mg/dL — ABNORMAL HIGH (ref 8–23)
CO2: 29 mmol/L (ref 22–32)
CO2: 27 mmol/L (ref 22–32)
Calcium: 8.1 mg/dL — ABNORMAL LOW (ref 8.9–10.3)
Calcium: 8.3 mg/dL — ABNORMAL LOW (ref 8.9–10.3)
Chloride: 94 mmol/L — ABNORMAL LOW (ref 98–111)
Chloride: 95 mmol/L — ABNORMAL LOW (ref 98–111)
Creatinine, Ser: 1.02 mg/dL (ref 0.61–1.24)
Creatinine, Ser: 1.15 mg/dL (ref 0.61–1.24)
GFR, Estimated: >60 mL/min (ref >60)
GFR, Estimated: >60 mL/min (ref >60)
Glucose, Bld: 102 mg/dL — ABNORMAL HIGH (ref 70–99)
Glucose, Bld: 182 mg/dL — ABNORMAL HIGH (ref 70–99)
Potassium: 3.3 mmol/L — ABNORMAL LOW (ref 3.5–5.1)
Potassium: 3.5 mmol/L (ref 3.5–5.1)
Sodium: 134 mmol/L — ABNORMAL LOW (ref 135–145)
Sodium: 135 mmol/L (ref 135–145)

## 2023-06-02 LAB — APTT: aPTT: 85 s — ABNORMAL HIGH (ref 24–36)

## 2023-06-02 LAB — HEPARIN LEVEL (UNFRACTIONATED): Heparin Unfractionated: 1.01 [IU]/mL — ABNORMAL HIGH (ref 0.30–0.70)

## 2023-06-02 LAB — MAGNESIUM
Magnesium: 2 mg/dL (ref 1.7–2.4)
Magnesium: 2.1 mg/dL (ref 1.7–2.4)

## 2023-06-02 LAB — POTASSIUM: Potassium: 3.4 mmol/L — ABNORMAL LOW (ref 3.5–5.1)

## 2023-06-02 MED ORDER — FUROSEMIDE 40 MG PO TABS
40.0000 mg | ORAL_TABLET | Freq: Every day | ORAL | Status: DC
  Administered 2023-06-02 – 2023-06-09 (×8): 40 mg via ORAL
  Filled 2023-06-02 (×8): qty 1

## 2023-06-02 MED ORDER — APIXABAN 5 MG PO TABS
5.0000 mg | ORAL_TABLET | Freq: Two times a day (BID) | ORAL | Status: DC
  Administered 2023-06-02 – 2023-06-03 (×2): 5 mg via ORAL
  Filled 2023-06-02 (×2): qty 1

## 2023-06-02 MED ORDER — APIXABAN 5 MG PO TABS
5.0000 mg | ORAL_TABLET | ORAL | Status: AC
  Administered 2023-06-02: 5 mg via ORAL
  Filled 2023-06-02: qty 1

## 2023-06-02 MED ORDER — POTASSIUM CHLORIDE CRYS ER 20 MEQ PO TBCR
20.0000 meq | EXTENDED_RELEASE_TABLET | ORAL | Status: AC
  Administered 2023-06-02 (×3): 20 meq via ORAL
  Filled 2023-06-02 (×3): qty 1

## 2023-06-02 MED ORDER — ACETAZOLAMIDE 250 MG PO TABS
250.0000 mg | ORAL_TABLET | Freq: Two times a day (BID) | ORAL | Status: AC
  Administered 2023-06-02 – 2023-06-03 (×4): 250 mg via ORAL
  Filled 2023-06-02 (×4): qty 1

## 2023-06-02 MED ORDER — METOPROLOL TARTRATE 25 MG PO TABS
25.0000 mg | ORAL_TABLET | Freq: Two times a day (BID) | ORAL | Status: DC
  Administered 2023-06-02 – 2023-06-03 (×4): 25 mg via ORAL
  Filled 2023-06-02 (×4): qty 1

## 2023-06-02 MED ORDER — HEPARIN (PORCINE) 25000 UT/250ML-% IV SOLN
1400.0000 [IU]/h | INTRAVENOUS | Status: DC
  Administered 2023-06-02: 1400 [IU]/h via INTRAVENOUS
  Filled 2023-06-02: qty 250

## 2023-06-02 MED FILL — Lidocaine HCl Local Preservative Free (PF) Inj 2%: INTRAMUSCULAR | Qty: 14 | Status: AC

## 2023-06-02 MED FILL — Potassium Chloride Inj 2 mEq/ML: INTRAVENOUS | Qty: 40 | Status: AC

## 2023-06-02 MED FILL — Heparin Sodium (Porcine) Inj 1000 Unit/ML: Qty: 1000 | Status: AC

## 2023-06-02 NOTE — Progress Notes (Signed)
 TCTS DAILY ICU PROGRESS NOTE                   [address]            [place] [address]          [phone]   6 Days Post-Op Procedure(s) (LRB): MITRAL VALVE REPAIR USING MEDTRONIC SIMULUS SEMI-RIDGID ANNULOPLASTY BAND (MVR) (N/A) TRICUSPID VALVE REPAIR USING EDWARDS MC3 TRICUSPID ANNULOPLASTY RING (N/A) MAZE (N/A) TRANSESOPHAGEAL ECHOCARDIOGRAM (TEE) (N/A)  Total Length of Stay:  LOS: 6 days   Subjective: Fatigeued and generally feels poorly  Objective: Vital signs in last 24 hours: Temp:  [97.4 F (36.3 C)-98.1 F (36.7 C)] 98.1 F (36.7 C) (02/24 0400) Pulse Rate:  [95-142] 112 (02/24 0700) Cardiac Rhythm: Atrial fibrillation (02/23 2000) Resp:  [14-37] 20 (02/24 0700) BP: (92-119)/(62-89) 119/80 (02/24 0700) SpO2:  [87 %-97 %] 93 % (02/24 0700) Weight:  [91 kg] 91 kg (02/24 0500)  Filed Weights   05/31/23 0500 06/01/23 0500 06/02/23 0500  Weight: 96.4 kg 92.2 kg 91 kg    Weight change: -1.2 kg   Hemodynamic parameters for last 24 hours:    Intake/Output from previous day: 02/23 0701 - 02/24 0700 In: 325.8 [I.V.:325.8] Out: 1601 [Urine:1600; Stool:1]  Intake/Output this shift: No intake/output data recorded.  Current Meds: Scheduled Meds:  aspirin  81 mg Oral Daily   atorvastatin  20 mg Oral Daily   bisacodyl  10 mg Oral Daily   Or   bisacodyl  10 mg Rectal Daily   chlorhexidine  15 mL Mouth/Throat Once   Chlorhexidine Gluconate Cloth  6 each Topical Daily   docusate sodium  200 mg Oral Daily   feeding supplement  237 mL Oral BID BM   furosemide  40 mg Intravenous Q12H   insulin aspart  0-24 Units Subcutaneous TID WC   insulin aspart  0-5 Units Subcutaneous QHS   pantoprazole  40 mg Oral Daily   potassium chloride  20 mEq Oral Q4H   sertraline  25 mg Oral Daily   sodium chloride flush  3 mL Intravenous Q12H   sodium chloride flush  3-10 mL Intravenous Q12H   spironolactone  12.5 mg Oral Daily   Continuous  Infusions:  albumin human Stopped (05/28/23 0700)   PRN Meds:.albumin human, metoprolol tartrate, morphine injection, ondansetron (ZOFRAN) IV, mouth rinse, oxyCODONE, sodium chloride flush, sodium chloride flush, temazepam, traMADol  General appearance: fatigued and no distress Heart: irregularly irregular rhythm and tachy Lungs: clear Abdomen: mild distension, soft, non tender, + BS Extremities: no edema Wound: incis healing well  Lab Results: CBC: Recent Labs    06/01/23 0952 06/01/23 2230 06/02/23 0345  WBC 9.9  --  11.2*  HGB 11.0* 11.6* 10.7*  HCT 32.8* 34.0* 32.1*  PLT 182  --  209   BMET:  Recent Labs    06/01/23 0952 06/01/23 2230 06/01/23 2342 06/02/23 0345  NA 133* 132*  --  134*  K 3.2* 3.2* 3.4* 3.3*  CL 92*  --   --  94*  CO2 31  --   --  29  GLUCOSE 113*  --   --  102*  BUN 30*  --   --  30*  CREATININE 0.98  --   --  1.02  CALCIUM 7.9*  --   --  8.1*    CMET: Lab Results  Component Value Date   WBC 11.2 (H) 06/02/2023   HGB 10.7 (L)  06/02/2023   HCT 32.1 (L) 06/02/2023   PLT 209 06/02/2023   GLUCOSE 102 (H) 06/02/2023   CHOL 111 12/06/2022   TRIG 98 12/06/2022   HDL 28 (L) 12/06/2022   LDLCALC 65 12/06/2022   ALT 20 05/23/2023   AST 23 05/23/2023   NA 134 (L) 06/02/2023   K 3.3 (L) 06/02/2023   CL 94 (L) 06/02/2023   CREATININE 1.02 06/02/2023   BUN 30 (H) 06/02/2023   CO2 29 06/02/2023   TSH 1.83 04/07/2023   PSA <0.04 08/13/2021   INR 1.7 (H) 05/27/2023   HGBA1C 5.8 (H) 05/23/2023      PT/INR: No results for input(s): "LABPROT", "INR" in the last 72 hours. Radiology: No results found.   Assessment/Plan: S/P Procedure(s) (LRB): MITRAL VALVE REPAIR USING MEDTRONIC SIMULUS SEMI-RIDGID ANNULOPLASTY BAND (MVR) (N/A) TRICUSPID VALVE REPAIR USING EDWARDS MC3 TRICUSPID ANNULOPLASTY RING (N/A) MAZE (N/A) TRANSESOPHAGEAL ECHOCARDIOGRAM (TEE) (N/A) POD#6  1 afeb, S BP 90's-110's, afib, currently 120's-130's, EP on board  for consideration of pacer for tachy/brady, on heparin gtt, will need further rate control- holding on pacer today, will feed today 2 O2 sats good on 1 liter 3 good UOP, not all measured, normal renal fxn, change to po lasix, add diamox short term for alkalosis on ABG 4 + BM 5 K+ 3.3- replace, Mg++ 2.0 6 minor leukocytosis, WBC 11.2 7 H/H stable  8 CXR some atx 9 routine pulm hygiene and rehab    Rowe Clack PA-C Pager [phone] 06/02/2023 7:08 AM

## 2023-06-02 NOTE — Plan of Care (Signed)
  Problem: Clinical Measurements: Goal: Ability to maintain clinical measurements within normal limits will improve Outcome: Progressing   Problem: Coping: Goal: Level of anxiety will decrease Outcome: Progressing   Problem: Pain Managment: Goal: General experience of comfort will improve and/or be controlled Outcome: Progressing   Problem: Activity: Goal: Risk for activity intolerance will decrease Outcome: Progressing

## 2023-06-02 NOTE — Progress Notes (Addendum)
 Patient Name: [name] [name] Date of Encounter: 06/02/2023  Primary Cardiologist: [name], MD Electrophysiologist: Lewayne Bunting, MD  Interval Summary   Feeling down this am. Had tentatively planned for PPM today but now in AF with RVR with rates mostly 100-120s   Vital Signs    Vitals:   06/02/23 0500 06/02/23 0600 06/02/23 0700 06/02/23 0800  BP:   119/80 103/67  Pulse: (!) 110 (!) 102 (!) 112 (!) 120  Resp: 17 20 20  (!) 23  Temp:      TempSrc:      SpO2: 96% 93% 93% 100%  Weight: 91 kg     Height:        Intake/Output Summary (Last 24 hours) at 06/02/2023 0852 Last data filed at 06/02/2023 0800 Gross per 24 hour  Intake 325.78 ml  Output 1601 ml  Net -1275.22 ml   Filed Weights   05/31/23 0500 06/01/23 0500 06/02/23 0500  Weight: 96.4 kg 92.2 kg 91 kg    Physical Exam    GEN- The patient is fatigued appearing, alert and oriented x 3 today.   Lungs- Clear to ausculation bilaterally, normal work of breathing Cardiac- Irregularly irregular rate and rhythm, no murmurs, rubs or gallops GI- soft, NT, ND, + BS Extremities- no clubbing or cyanosis. No edema  Telemetry    AF with RVR 100-150s (personally reviewed)  Hospital Course    [name] [name] is a 78 y.o. male admitted for planned MAZE/MV/TV repair, post operative course complicated by SND with bradycardia, and now AF with RVR  Assessment & Plan    SND Into fib since 2/21 and rates have gradually climbed through weekend.  Will cautiously add back BB this am with elevated rates Avoid amio for now.   Persistent AF with RVR Will plan TEE/DCC tomorrow 2/25, and tentatively plan pacing if has recurrent bradycardia.  If has stable sinus rates, would tentatively be able to avoid pacing for now.  Continue heparin this am, will discuss with MDs if transition to eliquis would be better with pending pacing decision.   S/p MV and TV repair If pacing required, hope to avoid crossing the valve.   Acute  systolic CHF EF 30-35% 2/21 (down from 55% 02/14/2023) Potentially in the setting of valvular disease and tachycardia.  Poor candidate for ICD lead at this time with new Tricuspid repair  Will further discuss with MD, but plan tentatively is for rate control today, with TEE/DCC to help decide course depending on his sinus rates.   For questions or updates, please contact [facility] Please consult [email or web address] for contact info under Cardiology/STEMI.  Signed, Graciella Freer, PA-C  06/02/2023, 8:52 AM   I have seen, examined the patient, and reviewed the above assessment and plan.    Interval: Patient remains in atrial fibrillation with rapid ventricular rates. Has felt better but no new or acute complaints.   GEN: No acute distress.   Cardiac: Tachycardic, irregular.  Psych: Normal affect   Limited Echo 05/30/23: 1. Left ventricular ejection fraction, by estimation, is 30 to 35%. The  left ventricle has moderately decreased function. The left ventricle  demonstrates global hypokinesis. There is mild concentric left ventricular  hypertrophy.   2. Right ventricular systolic function is moderately reduced. The right  ventricular size is moderately enlarged.   3. Left atrial size was moderately dilated.   4. Right atrial size was moderately dilated.   5. The mitral valve has been repaired/replaced.   6. The  inferior vena cava is dilated in size with <50% respiratory  variability, suggesting right atrial pressure of 15 mmHg.    Assessment: [name] [name] is an 78 y.o. male with a history of severe MR, moderate TR, HLD, PAF previously on flecainide, AFL s/p ablation 2012 on Xarelto, CVA, chronic UTI's, prostate cancer s/p prostatectomy who underwent mitral valve repair, tricuspid valve repair, MAZE and LAAC on 2/18 with Dr. Leafy Ro. Had rapid rates immediately post op and was given amiodarone.  He was then dependent on temporary epicardial pacing wire on postop day 3 sinus node  dysfunction.  Unfortunately, he went back into atrial fibrillation shortly thereafter, where he remains today.  Narrow QRS and is able to conduct with rapid rates.  We have not been able to give adequate time to see if sinus node function is going to recover due to paroxysms of atrial fibrillation.  We only briefly saw sinus node activity on postop day 3.  His sinus node dysfunction is likely secondary to maze and could potentially be reversible.  Before committing patient to permanent pacemaker implant, I think it is reasonable to pursue cardioversion to assess for any underlying sinus activity. Limited post op echocardiogram showed EF 30-35% so patient would meet criteria for ICD.    Problem List:  Post operative sinus node dysfunction  Paroxysmal atrial fibrillation Atrial flutter s/p ablation S/p mitral repair, tricuspid repair, MAZE and LAAC   Plan:  -We will try to rate control atrial fibrillation today. -Fortunately his anticoagulation was interrupted so he will require TEE prior to cardioversion.  This will be done tomorrow. -If patient has sinus rhythm after cardioversion then we will continue to monitor even if initially bradycardic.  He still has temporary epicardial pacing wires in place.  We will recheck threshold prior to cardioversion.  If he has no underlying sinus activity following cardioversion then we will pursue permanent pacemaker implant tomorrow.  Will likely place single chamber pacemaker with RA only given freshly repaired tricuspid valve in the setting of intact AVN conduction with narrow QRS. -Echocardiogram shows LVEF 30 to 35% in the acute postoperative phase.  Patient is 78 years old valvular heart disease and we would like to avoid placing wires across his freshly repaired tricuspid valve, so after discussion with the patient we do not feel like there is enough benefit to defibrillator therapy at this time.    Nobie Putnam, MD 06/02/2023 10:38 PM

## 2023-06-02 NOTE — Progress Notes (Signed)
 NAME:  [name] [name], MRN:  [id number], DOB:  08-30-1945, LOS: 6 ADMISSION DATE:  05/27/2023, CONSULTATION DATE:  2/18 REFERRING MD:  Leafy Ro, CHIEF COMPLAINT:  post op critical care support    History of Present Illness:  78 year old male w/ medical hx as outlined below. Followed by both cards and CVTS for mod/svr MR w/ progressive fatigue. Presented to [facility] 2/18 for elective MR/TR and MAZE Intra-op echo 45-50% EF, LV global hypokinesis, mildly elevated PAS,  RV mildly enlarged.  Bypass started 0852 ended 1118  cell saver  To CVICU post op  PCCM asked to assist w/ post op care  Pertinent  Medical History  Severe MR, mod TR, HFmrEF (EF 55%), Dysrhythmia, HL, PAF s/p ablation 2012 (on xarelto), Prior CVA,chronic UTIs, prostate cancer s/p prostatectomy  Significant Hospital Events: Including procedures, antibiotic start and stop dates in addition to other pertinent events    2/18 MAZE, MV repair, TV repair, extubated overnight  Interim History / Subjective:  Feels very fatigued and has been frustrated with slow progress.Had SOB with tachycardia to160s this morning; better since HR came back down.  Objective   Blood pressure 119/80, pulse (!) 112, temperature 98.1 F (36.7 C), temperature source Oral, resp. rate 20, height 5\' 10"  (1.778 m), weight 91 kg, SpO2 93%.        Intake/Output Summary (Last 24 hours) at 06/02/2023 0720 Last data filed at 06/02/2023 0700 Gross per 24 hour  Intake 325.76 ml  Output 1601 ml  Net -1275.24 ml   Filed Weights   05/31/23 0500 06/01/23 0500 06/02/23 0500  Weight: 96.4 kg 92.2 kg 91 kg    Examination: General: fatigued appearing man sitting up in recliner HENT: [place]/AT, eyes anicteric lungs: breathing comfortably on [place], basilar rhales. No tachypnea or conversational dyspnea.  Cardiovascular: S1S2, tachycardic, irreg rhythm abdomen: soft, NT Extremities: improving LE edema, no cyanosis Neuro: awake, alert, moving all extremities Derm:  warm, dry  Na+ 134 K+ 3.3 BUN 30 Cr 1.02 WBC 11.2 H/H  10.7/32.1 Platelets 209 PTT 85 CXR personally reviewed> cardiomegaly,mild edema, improving left pleural effusion  I/O -1.3L -7.8L for admission  Resolved Hospital Problem list     Assessment & Plan:  H/o Severe mitral regurgitation, tricuspid regurgitation, Afib, s/p MV and TV repair w/ MAZE on 2/18 Acute HFrEF post-op Persistent Afib, sinus node dysfunction with tachy-brady syndrome post-MAZE. Now  Afib with RVR. -post-op care per TCTS -con't diuresis -appreciate EP's management; needing more rate control now. Leave epicardial wires for pacing.  -aspirin, statin -heparin for [facility] -progress mobility as tolerated -eventually needs GDMT as BP allows. Con't spironolactone.  -eventually needs GDMT as BP allows; still too soft for entresto or ARB. Add spiro today, which will help K+ -heparin for AC; holding apixaban while deciding what to do about PPM  Hyperglycemia; h/o pre-DM. A1c 5.8 -PRN SSI; minimal requirements -goal BG 140-180  AKI, resolved Hypervolemic hyponatremia- improving -con't diuresis -watch with spironolactone addition -renally dose meds, avoid nephrotoxic meds  ABLA- expected post-op Consumptive thrombocytopenia- expected post-op, recovering -monitor, no indication for transfusion  H/o HTN & HFrEF  -Con't holding PTA losartan and Toprol XL  Chronic pain -con't PTA tramadol  Fatigue, situational depression -asked for depression meds, started zoloft -ambulate, con't progressing mobility -prioritize sleep  Best Practice (right click and "Reselect all SmartList Selections" daily)   Diet/type: Regular consistency (see orders) DVT prophylaxis systemic heparin Pressure ulcer(s): N/A GI prophylaxis: PPI Lines: N/A Foley:  N/A Code Status:  full code Last date of multidisciplinary goals of care discussion [per primary ]  Labs   CBC: Recent Labs  Lab 05/30/23 0727 05/31/23 0537  06/01/23 0145 06/01/23 0952 06/01/23 2230 06/02/23 0345  WBC 10.8* 11.5* 9.6 9.9  --  11.2*  HGB 10.5* 11.0* 10.8* 11.0* 11.6* 10.7*  HCT 33.2* 33.9* 32.3* 32.8* 34.0* 32.1*  MCV 94.6 93.4 90.5 90.4  --  90.2  PLT 105* 148* 170 182  --  209    Basic Metabolic Panel: Recent Labs  Lab 05/28/23 0310 05/28/23 1818 05/29/23 0500 05/30/23 0727 05/31/23 0537 06/01/23 0145 06/01/23 0952 06/01/23 2230 06/01/23 2342 06/02/23 0345  NA 136 136 135 135 138 133* 133* 132*  --  134*  K 4.1 4.6 4.4 3.8 4.1 3.1* 3.2* 3.2* 3.4* 3.3*  CL 106 105 103 99 97* 92* 92*  --   --  94*  CO2 20* 21* 22 23 28 28 31   --   --  29  GLUCOSE 138* 150* 107* 145* 114* 109* 113*  --   --  102*  BUN 14 21 26* 37* 34* 30* 30*  --   --  30*  CREATININE 1.08 1.24 1.18 1.47* 1.13 1.00 0.98  --   --  1.02  CALCIUM 7.8* 7.9* 8.1* 8.0* 8.6* 8.2* 7.9*  --   --  8.1*  MG 2.4 2.3 2.5*  --   --   --   --   --  2.1 2.0   GFR: Estimated Creatinine Clearance: 65.5 mL/min (by C-G formula based on SCr of 1.02 mg/dL). Recent Labs  Lab 05/31/23 0537 06/01/23 0145 06/01/23 0952 06/02/23 0345  WBC 11.5* 9.6 9.9 11.2*        Critical care time:       Steffanie Dunn, DO 06/02/23 9:30 AM [facility]  For contact information, see Amion. If no response to pager, please call PCCM consult pager. After hours, 7PM- 7AM, please call Elink.

## 2023-06-02 NOTE — H&P (View-Only) (Signed)
 Patient Name: NATHANEIL FEAGANS Date of Encounter: 06/02/2023  Primary Cardiologist: [name], MD Electrophysiologist: Lewayne Bunting, MD  Interval Summary   Feeling down this am. Had tentatively planned for PPM today but now in AF with RVR with rates mostly 100-120s   Vital Signs    Vitals:   06/02/23 0500 06/02/23 0600 06/02/23 0700 06/02/23 0800  BP:   119/80 103/67  Pulse: (!) 110 (!) 102 (!) 112 (!) 120  Resp: 17 20 20  (!) 23  Temp:      TempSrc:      SpO2: 96% 93% 93% 100%  Weight: 91 kg     Height:        Intake/Output Summary (Last 24 hours) at 06/02/2023 0852 Last data filed at 06/02/2023 0800 Gross per 24 hour  Intake 325.78 ml  Output 1601 ml  Net -1275.22 ml   Filed Weights   05/31/23 0500 06/01/23 0500 06/02/23 0500  Weight: 96.4 kg 92.2 kg 91 kg    Physical Exam    GEN- The patient is fatigued appearing, alert and oriented x 3 today.   Lungs- Clear to ausculation bilaterally, normal work of breathing Cardiac- Irregularly irregular rate and rhythm, no murmurs, rubs or gallops GI- soft, NT, ND, + BS Extremities- no clubbing or cyanosis. No edema  Telemetry    AF with RVR 100-150s (personally reviewed)  Hospital Course    KIYON FIDALGO is a 78 y.o. male admitted for planned MAZE/MV/TV repair, post operative course complicated by SND with bradycardia, and now AF with RVR  Assessment & Plan    SND Into fib since 2/21 and rates have gradually climbed through weekend.  Will cautiously add back BB this am with elevated rates Avoid amio for now.   Persistent AF with RVR Will plan TEE/DCC tomorrow 2/25, and tentatively plan pacing if has recurrent bradycardia.  If has stable sinus rates, would tentatively be able to avoid pacing for now.  Continue heparin this am, will discuss with MDs if transition to eliquis would be better with pending pacing decision.   S/p MV and TV repair If pacing required, hope to avoid crossing the valve.   Acute  systolic CHF EF 30-35% 2/21 (down from 55% 02/14/2023) Potentially in the setting of valvular disease and tachycardia.  Poor candidate for ICD lead at this time with new Tricuspid repair  Will further discuss with MD, but plan tentatively is for rate control today, with TEE/DCC to help decide course depending on his sinus rates.   For questions or updates, please contact [facility] Please consult [email or web address] for contact info under Cardiology/STEMI.  Signed, Graciella Freer, PA-C  06/02/2023, 8:52 AM   I have seen, examined the patient, and reviewed the above assessment and plan.    Interval: Patient remains in atrial fibrillation with rapid ventricular rates. Has felt better but no new or acute complaints.   GEN: No acute distress.   Cardiac: Tachycardic, irregular.  Psych: Normal affect   Limited Echo 05/30/23: 1. Left ventricular ejection fraction, by estimation, is 30 to 35%. The  left ventricle has moderately decreased function. The left ventricle  demonstrates global hypokinesis. There is mild concentric left ventricular  hypertrophy.   2. Right ventricular systolic function is moderately reduced. The right  ventricular size is moderately enlarged.   3. Left atrial size was moderately dilated.   4. Right atrial size was moderately dilated.   5. The mitral valve has been repaired/replaced.   6. The  inferior vena cava is dilated in size with <50% respiratory  variability, suggesting right atrial pressure of 15 mmHg.    Assessment: MILO SCHREIER is an 78 y.o. male with a history of severe MR, moderate TR, HLD, PAF previously on flecainide, AFL s/p ablation 2012 on Xarelto, CVA, chronic UTI's, prostate cancer s/p prostatectomy who underwent mitral valve repair, tricuspid valve repair, MAZE and LAAC on 2/18 with Dr. Leafy Ro. Had rapid rates immediately post op and was given amiodarone.  He was then dependent on temporary epicardial pacing wire on postop day 3 sinus node  dysfunction.  Unfortunately, he went back into atrial fibrillation shortly thereafter, where he remains today.  Narrow QRS and is able to conduct with rapid rates.  We have not been able to give adequate time to see if sinus node function is going to recover due to paroxysms of atrial fibrillation.  We only briefly saw sinus node activity on postop day 3.  His sinus node dysfunction is likely secondary to maze and could potentially be reversible.  Before committing patient to permanent pacemaker implant, I think it is reasonable to pursue cardioversion to assess for any underlying sinus activity. Limited post op echocardiogram showed EF 30-35% so patient would meet criteria for ICD.    Problem List:  Post operative sinus node dysfunction  Paroxysmal atrial fibrillation Atrial flutter s/p ablation S/p mitral repair, tricuspid repair, MAZE and LAAC   Plan:  -We will try to rate control atrial fibrillation today. -Fortunately his anticoagulation was interrupted so he will require TEE prior to cardioversion.  This will be done tomorrow. -If patient has sinus rhythm after cardioversion then we will continue to monitor even if initially bradycardic.  He still has temporary epicardial pacing wires in place.  We will recheck threshold prior to cardioversion.  If he has no underlying sinus activity following cardioversion then we will pursue permanent pacemaker implant tomorrow.  Will likely place single chamber pacemaker with RA only given freshly repaired tricuspid valve in the setting of intact AVN conduction with narrow QRS. -Echocardiogram shows LVEF 30 to 35% in the acute postoperative phase.  Patient is 78 years old valvular heart disease and we would like to avoid placing wires across his freshly repaired tricuspid valve, so after discussion with the patient we do not feel like there is enough benefit to defibrillator therapy at this time.    Nobie Putnam, MD 06/02/2023 10:38 PM

## 2023-06-03 ENCOUNTER — Other Ambulatory Visit: Payer: Self-pay

## 2023-06-03 ENCOUNTER — Encounter (HOSPITAL_COMMUNITY)
Admission: RE | Disposition: A | Discharge status: Rehabilitation Facility | Payer: Self-pay | Source: Home / Self Care | Attending: Thoracic Surgery (Cardiothoracic Vascular Surgery)

## 2023-06-03 ENCOUNTER — Inpatient Hospital Stay (HOSPITAL_COMMUNITY): Payer: Medicare Other

## 2023-06-03 ENCOUNTER — Inpatient Hospital Stay (HOSPITAL_COMMUNITY): Payer: Self-pay | Admitting: Anesthesiology

## 2023-06-03 ENCOUNTER — Encounter (HOSPITAL_COMMUNITY)
Admission: RE | Disposition: A | Discharge status: Other Acute Inpatient Hospital | Payer: Self-pay | Source: Home / Self Care | Attending: Thoracic Surgery (Cardiothoracic Vascular Surgery)

## 2023-06-03 ENCOUNTER — Encounter (HOSPITAL_COMMUNITY): Payer: Self-pay | Admitting: Thoracic Surgery (Cardiothoracic Vascular Surgery)

## 2023-06-03 DIAGNOSIS — I4891 Unspecified atrial fibrillation: Secondary | ICD-10-CM

## 2023-06-03 DIAGNOSIS — J189 Pneumonia, unspecified organism: Secondary | ICD-10-CM | POA: Diagnosis not present

## 2023-06-03 DIAGNOSIS — I3139 Other pericardial effusion (noninflammatory): Secondary | ICD-10-CM

## 2023-06-03 DIAGNOSIS — I480 Paroxysmal atrial fibrillation: Secondary | ICD-10-CM | POA: Diagnosis not present

## 2023-06-03 DIAGNOSIS — E871 Hypo-osmolality and hyponatremia: Secondary | ICD-10-CM | POA: Diagnosis not present

## 2023-06-03 DIAGNOSIS — I10 Essential (primary) hypertension: Secondary | ICD-10-CM | POA: Diagnosis not present

## 2023-06-03 DIAGNOSIS — I5021 Acute systolic (congestive) heart failure: Secondary | ICD-10-CM

## 2023-06-03 DIAGNOSIS — I639 Cerebral infarction, unspecified: Secondary | ICD-10-CM | POA: Diagnosis not present

## 2023-06-03 DIAGNOSIS — I495 Sick sinus syndrome: Secondary | ICD-10-CM | POA: Diagnosis not present

## 2023-06-03 DIAGNOSIS — I361 Nonrheumatic tricuspid (valve) insufficiency: Secondary | ICD-10-CM

## 2023-06-03 DIAGNOSIS — R739 Hyperglycemia, unspecified: Secondary | ICD-10-CM | POA: Diagnosis not present

## 2023-06-03 HISTORY — PX: CARDIOVERSION: EP1203

## 2023-06-03 HISTORY — PX: TRANSESOPHAGEAL ECHOCARDIOGRAM (CATH LAB): EP1270

## 2023-06-03 HISTORY — PX: PACEMAKER IMPLANT: EP1218

## 2023-06-03 LAB — CBC
HCT: 33 % — ABNORMAL LOW (ref 39.0–52.0)
Hemoglobin: 10.9 g/dL — ABNORMAL LOW (ref 13.0–17.0)
MCH: 30.4 pg (ref 26.0–34.0)
MCHC: 33 g/dL (ref 30.0–36.0)
MCV: 92.2 fL (ref 80.0–100.0)
Platelets: 272 10*3/uL (ref 150–400)
RBC: 3.58 MIL/uL — ABNORMAL LOW (ref 4.22–5.81)
RDW: 13.2 % (ref 11.5–15.5)
WBC: 12.5 10*3/uL — ABNORMAL HIGH (ref 4.0–10.5)
nRBC: 0 % (ref 0.0–0.2)

## 2023-06-03 LAB — BASIC METABOLIC PANEL
Anion gap: 9 (ref 5–15)
BUN: 28 mg/dL — ABNORMAL HIGH (ref 8–23)
CO2: 26 mmol/L (ref 22–32)
Calcium: 8.3 mg/dL — ABNORMAL LOW (ref 8.9–10.3)
Chloride: 99 mmol/L (ref 98–111)
Creatinine, Ser: 1 mg/dL (ref 0.61–1.24)
GFR, Estimated: >60 mL/min (ref >60)
Glucose, Bld: 101 mg/dL — ABNORMAL HIGH (ref 70–99)
Potassium: 3.4 mmol/L — ABNORMAL LOW (ref 3.5–5.1)
Sodium: 134 mmol/L — ABNORMAL LOW (ref 135–145)

## 2023-06-03 LAB — MAGNESIUM: Magnesium: 2 mg/dL (ref 1.7–2.4)

## 2023-06-03 LAB — ECHO TEE

## 2023-06-03 LAB — GLUCOSE, CAPILLARY
Glucose-Capillary: 81 mg/dL (ref 70–99)
Glucose-Capillary: 133 mg/dL — ABNORMAL HIGH (ref 70–99)

## 2023-06-03 SURGERY — TRANSESOPHAGEAL ECHOCARDIOGRAM (TEE) (CATHLAB)
Anesthesia: General

## 2023-06-03 SURGERY — PACEMAKER IMPLANT
Anesthesia: LOCAL

## 2023-06-03 MED ORDER — POTASSIUM CHLORIDE CRYS ER 20 MEQ PO TBCR
20.0000 meq | EXTENDED_RELEASE_TABLET | ORAL | Status: AC
  Administered 2023-06-03 (×3): 20 meq via ORAL
  Filled 2023-06-03 (×3): qty 1

## 2023-06-03 MED ORDER — ACETAMINOPHEN 325 MG PO TABS
325.0000 mg | ORAL_TABLET | ORAL | Status: DC | PRN
  Filled 2023-06-03: qty 2

## 2023-06-03 MED ORDER — SODIUM CHLORIDE 0.9 % IV SOLN: INTRAVENOUS | Status: DC

## 2023-06-03 MED ORDER — SODIUM CHLORIDE 0.9% FLUSH
3.0000 mL | Freq: Two times a day (BID) | INTRAVENOUS | Status: DC
  Administered 2023-06-03 – 2023-06-05 (×5): 3 mL via INTRAVENOUS

## 2023-06-03 MED ORDER — CHLORHEXIDINE GLUCONATE 4 % EX SOLN
60.0000 mL | Freq: Once | CUTANEOUS | Status: DC
  Filled 2023-06-03: qty 60

## 2023-06-03 MED ORDER — SODIUM CHLORIDE 0.9 % IV SOLN
INTRAVENOUS | Status: AC
  Administered 2023-06-03: 80 mg
  Filled 2023-06-03: qty 2

## 2023-06-03 MED ORDER — PHENYLEPHRINE 80 MCG/ML (10ML) SYRINGE FOR IV PUSH (FOR BLOOD PRESSURE SUPPORT)
PREFILLED_SYRINGE | INTRAVENOUS | Status: DC | PRN
  Administered 2023-06-03 (×2): 120 ug via INTRAVENOUS

## 2023-06-03 MED ORDER — AMIODARONE HCL IN DEXTROSE 360-4.14 MG/200ML-% IV SOLN
30.0000 mg/h | INTRAVENOUS | Status: DC
  Administered 2023-06-03 – 2023-06-05 (×4): 30 mg/h via INTRAVENOUS
  Filled 2023-06-03 (×2): qty 200

## 2023-06-03 MED ORDER — CHLORHEXIDINE GLUCONATE 4 % EX SOLN
CUTANEOUS | Status: AC
  Filled 2023-06-03: qty 15

## 2023-06-03 MED ORDER — PROPOFOL 10 MG/ML IV BOLUS
INTRAVENOUS | Status: DC | PRN
  Administered 2023-06-03: 50 mg via INTRAVENOUS

## 2023-06-03 MED ORDER — SODIUM CHLORIDE 0.9 % IV SOLN: INTRAVENOUS | Status: DC | PRN

## 2023-06-03 MED ORDER — SODIUM CHLORIDE 0.9 % IV SOLN
80.0000 mg | INTRAVENOUS | Status: AC
  Filled 2023-06-03: qty 2

## 2023-06-03 MED ORDER — LIDOCAINE 2% (20 MG/ML) 5 ML SYRINGE
INTRAMUSCULAR | Status: DC | PRN
  Administered 2023-06-03: 40 mg via INTRAVENOUS

## 2023-06-03 MED ORDER — CEFAZOLIN SODIUM-DEXTROSE 2-4 GM/100ML-% IV SOLN
INTRAVENOUS | Status: AC
  Administered 2023-06-03: 2 g via INTRAVENOUS
  Filled 2023-06-03: qty 100

## 2023-06-03 MED ORDER — CEFAZOLIN SODIUM-DEXTROSE 2-4 GM/100ML-% IV SOLN: 2.0000 g | INTRAVENOUS | Status: AC

## 2023-06-03 MED ORDER — LIDOCAINE HCL 1 % IJ SOLN
INTRAMUSCULAR | Status: AC
  Filled 2023-06-03: qty 60

## 2023-06-03 MED ORDER — LIDOCAINE HCL (PF) 1 % IJ SOLN
INTRAMUSCULAR | Status: DC | PRN
Start: 2023-06-03 — End: 2023-06-03
  Administered 2023-06-03: 50 mL

## 2023-06-03 MED ORDER — PHENYLEPHRINE HCL-NACL 20-0.9 MG/250ML-% IV SOLN
INTRAVENOUS | Status: DC | PRN
  Administered 2023-06-03: 35 ug/min via INTRAVENOUS

## 2023-06-03 MED ORDER — CHLORHEXIDINE GLUCONATE 4 % EX SOLN: 60.0000 mL | Freq: Once | CUTANEOUS | Status: DC

## 2023-06-03 MED ORDER — AMIODARONE LOAD VIA INFUSION
150.0000 mg | Freq: Once | INTRAVENOUS | Status: AC
  Administered 2023-06-03: 150 mg via INTRAVENOUS
  Filled 2023-06-03: qty 83.34

## 2023-06-03 MED ORDER — AMIODARONE HCL IN DEXTROSE 360-4.14 MG/200ML-% IV SOLN
60.0000 mg/h | INTRAVENOUS | Status: DC
  Administered 2023-06-03 (×2): 60 mg/h via INTRAVENOUS
  Filled 2023-06-03 (×3): qty 200

## 2023-06-03 MED ORDER — SODIUM CHLORIDE 0.9% FLUSH: 3.0000 mL | INTRAVENOUS | Status: DC | PRN

## 2023-06-03 MED ORDER — CEFAZOLIN SODIUM-DEXTROSE 1-4 GM/50ML-% IV SOLN
1.0000 g | Freq: Four times a day (QID) | INTRAVENOUS | Status: AC
  Administered 2023-06-03 – 2023-06-04 (×3): 1 g via INTRAVENOUS
  Filled 2023-06-03 (×3): qty 50

## 2023-06-03 MED ORDER — PROPOFOL 500 MG/50ML IV EMUL
INTRAVENOUS | Status: DC | PRN
  Administered 2023-06-03: 65 ug/kg/min via INTRAVENOUS

## 2023-06-03 SURGICAL SUPPLY — 8 items
CABLE SURGICAL S-101-97-12 (CABLE) ×2 IMPLANT
KIT MICROPUNCTURE NIT STIFF (SHEATH) IMPLANT
LEAD ULTIPACE 52 LPA1231/52 (Lead) IMPLANT
MAT PREVALON FULL STRYKER (MISCELLANEOUS) IMPLANT
PACEMAKER ASSURITY DR-RF (Pacemaker) IMPLANT
PAD DEFIB RADIO PHYSIO CONN (PAD) ×2 IMPLANT
SHEATH 7FR PRELUDE SNAP 13 (SHEATH) IMPLANT
TRAY PACEMAKER INSERTION (PACKS) ×2 IMPLANT

## 2023-06-03 SURGICAL SUPPLY — 1 items: PAD DEFIB RADIO PHYSIO CONN (PAD) ×2 IMPLANT

## 2023-06-03 NOTE — Progress Notes (Signed)
 NAME:  [name] [name], MRN:  [id number], DOB:  1945-07-14, LOS: 7 ADMISSION DATE:  05/27/2023, CONSULTATION DATE:  2/18 REFERRING MD:  Leafy Ro, CHIEF COMPLAINT:  post op critical care support    History of Present Illness:  78 year old male w/ medical hx as outlined below. Followed by both cards and CVTS for mod/svr MR w/ progressive fatigue. Presented to [facility] 2/18 for elective MR/TR and MAZE Intra-op echo 45-50% EF, LV global hypokinesis, mildly elevated PAS,  RV mildly enlarged.  Bypass started 0852 ended 1118  cell saver  To CVICU post op  PCCM asked to assist w/ post op care  Pertinent  Medical History  Severe MR, mod TR, HFmrEF (EF 55%), Dysrhythmia, HL, PAF s/p ablation 2012 (on xarelto), Prior CVA,chronic UTIs, prostate cancer s/p prostatectomy  Significant Hospital Events: Including procedures, antibiotic start and stop dates in addition to other pertinent events    2/18 MAZE, MV repair, TV repair, extubated overnight  Interim History / Subjective:  Cardioverted this morning, then requiring pacing. Still fatigued.  Objective   Blood pressure 99/70, pulse 99, temperature 98 F (36.7 C), temperature source Oral, resp. rate 17, height 5\' 10"  (1.778 m), weight 90.5 kg, SpO2 93%.        Intake/Output Summary (Last 24 hours) at 06/03/2023 0713 Last data filed at 06/03/2023 8295 Gross per 24 hour  Intake 433.42 ml  Output 2200 ml  Net -1766.58 ml   Filed Weights   06/01/23 0500 06/02/23 0500 06/03/23 0705  Weight: 92.2 kg 91 kg 90.5 kg    Examination: General: Chronically ill-appearing man lying in bed no acute distress HENT: [place]/AT, eyes anicteric lungs: Breathing comfortably nasal cannula, CTAB Cardiovascular: S1-S2, paced rhythm abdomen: Soft, nontender Extremities: mild ankle edema Neuro: Awake, answering questions appropriately.  Fatigued after anesthesia. Derm: Warm, dry, no diffuse rashes.  Sternal incision healing well.  Na+ 134 K+ 3.3 BUN 30 Cr  1.02 WBC 12.5  H/H 10.9/33 Platelets 272  I/O -1.8L -9.5L for admission  Resolved Hospital Problem list     Assessment & Plan:  H/o Severe mitral regurgitation, tricuspid regurgitation, Afib, s/p MV and TV repair w/ MAZE on 2/18 Acute HFrEF post-op Persistent Afib, sinus node dysfunction with tachy-brady syndrome post-MAZE. Now  Afib with RVR. -post-op care per TCTS -DCCV today; now requiring pacing. PPM this afternoon. He diuretics - Aspirin, statin - Heparin on hold, eventually needs to back on DOAC for atrial fibrillation. - Continue working on progressing mobility.  Continue spironolactone, eventually needs GDMT as blood pressure can tolerate.  Hyperglycemia; h/o pre-DM. A1c 5.8 -As needed sliding scale insulin, minimal requirements  AKI, resolved Hypervolemic hyponatremia -Continue diuresis -Tolerating spironolactone so far - Renally dose meds and avoid nephrotoxins  ABLA- expected post-op Consumptive thrombocytopenia- expected post-op, recovering -Monitor, no current indication for transfusion  H/o HTN & HFrEF  -Continue holding PTA losartan and Toprol  Chronic pain -Continue PTA tramadol, pain control per protocol  Fatigue, situational depression -day night orientation, prioritize sleep -progressive moblity as able -pain control -Started zoloft at his request   Best Practice (right click and "Reselect all SmartList Selections" daily)   Diet/type: Regular consistency (see orders) DVT prophylaxis systemic heparin Pressure ulcer(s): N/A GI prophylaxis: PPI Lines: N/A Foley:  N/A Code Status:  full code Last date of multidisciplinary goals of care discussion [per primary ]  Labs   CBC: Recent Labs  Lab 05/30/23 0727 05/31/23 0537 06/01/23 0145 06/01/23 0952 06/01/23 2230 06/02/23 0345  WBC  10.8* 11.5* 9.6 9.9  --  11.2*  HGB 10.5* 11.0* 10.8* 11.0* 11.6* 10.7*  HCT 33.2* 33.9* 32.3* 32.8* 34.0* 32.1*  MCV 94.6 93.4 90.5 90.4  --  90.2  PLT  105* 148* 170 182  --  209    Basic Metabolic Panel: Recent Labs  Lab 05/28/23 1818 05/29/23 0500 05/30/23 0727 06/01/23 0145 06/01/23 0952 06/01/23 2230 06/01/23 2342 06/02/23 0345 06/02/23 1524 06/03/23 0221  NA 136 135   < > 133* 133* 132*  --  134* 135 134*  K 4.6 4.4   < > 3.1* 3.2* 3.2* 3.4* 3.3* 3.5 3.4*  CL 105 103   < > 92* 92*  --   --  94* 95* 99  CO2 21* 22   < > 28 31  --   --  29 27 26   GLUCOSE 150* 107*   < > 109* 113*  --   --  102* 182* 101*  BUN 21 26*   < > 30* 30*  --   --  30* 30* 28*  CREATININE 1.24 1.18   < > 1.00 0.98  --   --  1.02 1.15 1.00  CALCIUM 7.9* 8.1*   < > 8.2* 7.9*  --   --  8.1* 8.3* 8.3*  MG 2.3 2.5*  --   --   --   --  2.1 2.0  --  2.0   < > = values in this interval not displayed.   GFR: Estimated Creatinine Clearance: 66.7 mL/min (by C-G formula based on SCr of 1 mg/dL). Recent Labs  Lab 05/31/23 0537 06/01/23 0145 06/01/23 0952 06/02/23 0345  WBC 11.5* 9.6 9.9 11.2*        Critical care time:       Steffanie Dunn, DO 06/03/23 4:24 PM [facility]  For contact information, see Amion. If no response to pager, please call PCCM consult pager. After hours, 7PM- 7AM, please call Elink.

## 2023-06-03 NOTE — Progress Notes (Addendum)
  Patient Name: [name] [name] Date of Encounter: 06/03/2023  Primary Cardiologist: [name], MD Electrophysiologist: Lewayne Bunting, MD  Interval Summary   The patient is doing well today.  No new complaints this am.  Vital Signs    Vitals:   06/03/23 0300 06/03/23 0400 06/03/23 0705 06/03/23 0731  BP: 90/64 99/70  116/81  Pulse: 99 99  (!) 120  Resp: 20 17  16   Temp:  98 F (36.7 C)  97.9 F (36.6 C)  TempSrc:  Oral  Temporal  SpO2: 95% 93%  95%  Weight:   90.5 kg   Height:        Intake/Output Summary (Last 24 hours) at 06/03/2023 0815 Last data filed at 06/03/2023 4098 Gross per 24 hour  Intake 419.43 ml  Output 2200 ml  Net -1780.57 ml   Filed Weights   06/01/23 0500 06/02/23 0500 06/03/23 0705  Weight: 92.2 kg 91 kg 90.5 kg    Physical Exam    GEN- The patient is fatigued appearing, alert and oriented x 3 today.   Lungs- Clear to ausculation bilaterally, normal work of breathing Cardiac- Irregularly irregular rate and rhythm, no murmurs, rubs or gallops GI- soft, NT, ND, + BS Extremities- no clubbing or cyanosis. No edema  Telemetry    AF 90-110s (personally reviewed)  Hospital Course    [name] [name] is a 78 y.o. male admitted for planned MAZE/MV/TV repair, post operative course complicated by SND with bradycardia, and now AF with RVR   Assessment & Plan    SND Persistent AF with RVR Into fib since 2/21 and rates have gradually climbed through weekend.  Somewhat improved with addition of lopressor yesterday. Avoid amio for now.  For TEE/DCC this am.  If has stable sinus rates, would tentatively be able to avoid pacing for now.  Transitioned to Eliquis. Pt verbalizes understanding that if pacing is required imminently, will involve increased risk of bleeding.   S/p MV and TV repair If pacing required, hope to avoid crossing the valve. (AAI)   Acute systolic CHF EF 30-35% 2/21 (down from 55% 02/14/2023) Potentially in the setting of  valvular disease and tachycardia.  Poor candidate for ICD lead at this time with new Tricuspid repair  For questions or updates, please contact [facility] Please consult [email or web address] for contact info under Cardiology/STEMI.  Signed, Graciella Freer, PA-C  06/03/2023, 8:15 AM   EP Attending  Patient seen and examined. He has undergone TEE guided DCCV and returned to NSR with a junctional escape at 45/min. No sinus activity. On exam he is a pleasant elderly man, NAD. Lungs with scattered rales and CV with a reg brady. Ext are warm and tele with junctional rhythm at 45/min.  A/P Symptomatic sinus node dysfunction after MAZE/TV/MV repair with LAA removal. I have discussed the treatment options with the patient and we will proceed with PPM insertion with an atrial lead only as his AV conduction appears to be good. I plan to use amiodarone, at least in the short term to help keep him in NSR.   Sharlot Gowda Sherryl Valido,MD

## 2023-06-03 NOTE — Anesthesia Preprocedure Evaluation (Addendum)
 Anesthesia Evaluation  Patient identified by MRN, date of birth, ID band Patient awake    Reviewed: Allergy & Precautions, NPO status , Patient's Chart, lab work & pertinent test results  Airway Mallampati: II  TM Distance: >3 FB Neck ROM: Full    Dental  (+) Teeth Intact, Dental Advisory Given   Pulmonary    breath sounds clear to auscultation       Cardiovascular hypertension, Pt. on medications and Pt. on home beta blockers + dysrhythmias Atrial Fibrillation + Valvular Problems/Murmurs  Rhythm:Irregular Rate:Abnormal  Echo:  1. Left ventricular ejection fraction, by estimation, is 30 to 35%. The  left ventricle has moderately decreased function. The left ventricle  demonstrates global hypokinesis. There is mild concentric left ventricular  hypertrophy.   2. Right ventricular systolic function is moderately reduced. The right  ventricular size is moderately enlarged.   3. Left atrial size was moderately dilated.   4. Right atrial size was moderately dilated.   5. The mitral valve has been repaired/replaced.   6. The inferior vena cava is dilated in size with <50% respiratory  variability, suggesting right atrial pressure of 15 mmHg.     Neuro/Psych CVA  negative psych ROS   GI/Hepatic negative GI ROS, Neg liver ROS,,,  Endo/Other  negative endocrine ROS    Renal/GU Renal disease     Musculoskeletal  (+) Arthritis ,    Abdominal   Peds  Hematology negative hematology ROS (+)   Anesthesia Other Findings   Reproductive/Obstetrics                             Anesthesia Physical Anesthesia Plan  ASA: 4  Anesthesia Plan: General   Post-op Pain Management: Minimal or no pain anticipated   Induction: Intravenous  PONV Risk Score and Plan: Propofol infusion  Airway Management Planned: Natural Airway and Nasal Cannula  Additional Equipment: None  Intra-op Plan:   Post-operative  Plan:   Informed Consent: I have reviewed the patients History and Physical, chart, labs and discussed the procedure including the risks, benefits and alternatives for the proposed anesthesia with the patient or authorized representative who has indicated his/her understanding and acceptance.       Plan Discussed with: CRNA  Anesthesia Plan Comments:        Anesthesia Quick Evaluation

## 2023-06-03 NOTE — Progress Notes (Signed)
      [address]       [place] [address]             [phone]    POD # 6 MR repair, TV repair   BP 100/73   Pulse 94   Temp 98.1 F (36.7 C) (Oral)   Resp (!) 21   Ht 5\' 10"  (1.778 m)   Wt 90.5 kg   SpO2 92%   BMI 28.63 kg/m  4L [place] 95% sat   Intake/Output Summary (Last 24 hours) at 06/03/2023 2004 Last data filed at 06/03/2023 1900 Gross per 24 hour  Intake 766.29 ml  Output 675 ml  Net 91.29 ml   Amiodarone drip at 60 mg/hr  Continue current care  Lavelle Akel C. Dorris Fetch, MD [facility] [phone]

## 2023-06-03 NOTE — Progress Notes (Signed)
 TCTS DAILY ICU PROGRESS NOTE                   [address]            [place] [address]          [phone]   * Day of Surgery * Procedure(s) (LRB): TRANSESOPHAGEAL ECHOCARDIOGRAM (N/A) CARDIOVERSION (N/A)  Total Length of Stay:  LOS: 7 days   Subjective: TEE/cardioversion done this am, may need PPM  Objective: Vital signs in last 24 hours: Temp:  [97.7 F (36.5 C)-98.3 F (36.8 C)] 97.9 F (36.6 C) (02/25 0731) Pulse Rate:  [59-120] 61 (02/25 0930) Cardiac Rhythm: Ventricular paced (02/25 0905) Resp:  [15-30] 16 (02/25 0930) BP: (81-116)/(62-85) 101/71 (02/25 0930) SpO2:  [90 %-100 %] 90 % (02/25 0930) Weight:  [90.5 kg] 90.5 kg (02/25 0705)  Filed Weights   06/01/23 0500 06/02/23 0500 06/03/23 0705  Weight: 92.2 kg 91 kg 90.5 kg    Weight change:    Hemodynamic parameters for last 24 hours:    Intake/Output from previous day: 02/24 0701 - 02/25 0700 In: 433.4 [P.O.:380; I.V.:53.4] Out: 2200 [Urine:2200]  Intake/Output this shift: Total I/O In: 250 [I.V.:250] Out: -   Current Meds: Scheduled Meds:  [MAR Hold] acetaZOLAMIDE  250 mg Oral BID   [MAR Hold] apixaban  5 mg Oral Q12H   [MAR Hold] aspirin  81 mg Oral Daily   [MAR Hold] atorvastatin  20 mg Oral Daily   [MAR Hold] bisacodyl  10 mg Oral Daily   Or   [MAR Hold] bisacodyl  10 mg Rectal Daily   chlorhexidine  15 mL Mouth/Throat Once   [MAR Hold] Chlorhexidine Gluconate Cloth  6 each Topical Daily   [MAR Hold] docusate sodium  200 mg Oral Daily   [MAR Hold] feeding supplement  237 mL Oral BID BM   [MAR Hold] furosemide  40 mg Oral Daily   [MAR Hold] insulin aspart  0-24 Units Subcutaneous TID WC   [MAR Hold] insulin aspart  0-5 Units Subcutaneous QHS   [MAR Hold] metoprolol tartrate  25 mg Oral BID   [MAR Hold] pantoprazole  40 mg Oral Daily   [MAR Hold] potassium chloride  20 mEq Oral Q4H   [MAR Hold] sertraline  25 mg Oral Daily   [MAR Hold] sodium chloride flush  3 mL  Intravenous Q12H   [MAR Hold] sodium chloride flush  3-10 mL Intravenous Q12H   [MAR Hold] spironolactone  12.5 mg Oral Daily   Continuous Infusions:  sodium chloride 20 mL/hr at 06/03/23 0634   [MAR Hold] albumin human Stopped (05/28/23 0700)   PRN Meds:.[MAR Hold] albumin human, [MAR Hold] metoprolol tartrate, [MAR Hold]  morphine injection, [MAR Hold] ondansetron (ZOFRAN) IV, [MAR Hold] mouth rinse, [MAR Hold] oxyCODONE, [MAR Hold] sodium chloride flush, [MAR Hold] sodium chloride flush, temazepam, [MAR Hold] traMADol  General appearance: alert, cooperative, fatigued, and no distress Heart: regular rate and rhythm Lungs: clear anteriorly Abdomen: benign Extremities: some foot/ankle edema Wound: incis healing well  Lab Results: CBC: Recent Labs    06/01/23 0952 06/01/23 2230 06/02/23 0345  WBC 9.9  --  11.2*  HGB 11.0* 11.6* 10.7*  HCT 32.8* 34.0* 32.1*  PLT 182  --  209   BMET:  Recent Labs    06/02/23 1524 06/03/23 0221  NA 135 134*  K 3.5 3.4*  CL 95* 99  CO2 27 26  GLUCOSE 182* 101*  BUN 30* 28*  CREATININE  1.15 1.00  CALCIUM 8.3* 8.3*    CMET: Lab Results  Component Value Date   WBC 11.2 (H) 06/02/2023   HGB 10.7 (L) 06/02/2023   HCT 32.1 (L) 06/02/2023   PLT 209 06/02/2023   GLUCOSE 101 (H) 06/03/2023   CHOL 111 12/06/2022   TRIG 98 12/06/2022   HDL 28 (L) 12/06/2022   LDLCALC 65 12/06/2022   ALT 20 05/23/2023   AST 23 05/23/2023   NA 134 (L) 06/03/2023   K 3.4 (L) 06/03/2023   CL 99 06/03/2023   CREATININE 1.00 06/03/2023   BUN 28 (H) 06/03/2023   CO2 26 06/03/2023   TSH 1.83 04/07/2023   PSA <0.04 08/13/2021   INR 1.7 (H) 05/27/2023   HGBA1C 5.8 (H) 05/23/2023      PT/INR: No results for input(s): "LABPROT", "INR" in the last 72 hours. Radiology: No results found.   Assessment/Plan: S/P Procedure(s) (LRB): TRANSESOPHAGEAL ECHOCARDIOGRAM (N/A) CARDIOVERSION (N/A) POD#7  1 afeb, S BP 81-116, Vpaced after TEE/cardioversion, will  be on eliquis, baby asa- EP to decide on PPM 2 O2 sats good on 2 liters 3 excellent UOP 4  + BM's 5 BS mostly well controlled, on reading in 200's, borderline DM, no home meds 6 K+ 3.4, replacement ordered 7 normal renal fxn 8 conts rehab and pulm hygiene    Rowe Clack PA-C Pager [phone] 06/03/2023 9:45 AM

## 2023-06-03 NOTE — Interval H&P Note (Signed)
 History and Physical Interval Note:  06/03/2023 7:52 AM  [name] [name]  has presented today for surgery, with the diagnosis of afib.  The various methods of treatment have been discussed with the patient and family. After consideration of risks, benefits and other options for treatment, the patient has consented to  Procedure(s): TRANSESOPHAGEAL ECHOCARDIOGRAM (N/A) CARDIOVERSION (N/A) as a surgical intervention.  The patient's history has been reviewed, patient examined, no change in status, stable for surgery.  I have reviewed the patient's chart and labs.  Questions were answered to the patient's satisfaction.     [name] [name]

## 2023-06-03 NOTE — Plan of Care (Signed)
  Problem: Education: Goal: Knowledge of General Education information will improve Description: Including pain rating scale, medication(s)/side effects and non-pharmacologic comfort measures Outcome: Progressing   Problem: Health Behavior/Discharge Planning: Goal: Ability to manage health-related needs will improve Outcome: Progressing   Problem: Clinical Measurements: Goal: Ability to maintain clinical measurements within normal limits will improve Outcome: Progressing Goal: Will remain free from infection Outcome: Progressing Goal: Respiratory complications will improve Outcome: Progressing Goal: Cardiovascular complication will be avoided Outcome: Not Progressing   Problem: Activity: Goal: Risk for activity intolerance will decrease Outcome: Progressing   Problem: Nutrition: Goal: Adequate nutrition will be maintained Outcome: Progressing   Problem: Coping: Goal: Level of anxiety will decrease Outcome: Not Progressing   Problem: Elimination: Goal: Will not experience complications related to bowel motility Outcome: Progressing Goal: Will not experience complications related to urinary retention Outcome: Progressing   Problem: Pain Managment: Goal: General experience of comfort will improve and/or be controlled Outcome: Progressing   Problem: Safety: Goal: Ability to remain free from injury will improve Outcome: Progressing   Problem: Skin Integrity: Goal: Risk for impaired skin integrity will decrease Outcome: Progressing   Problem: Education: Goal: Will demonstrate proper wound care and an understanding of methods to prevent future damage Outcome: Progressing Goal: Knowledge of disease or condition will improve Outcome: Progressing Goal: Knowledge of the prescribed therapeutic regimen will improve Outcome: Progressing   Problem: Activity: Goal: Risk for activity intolerance will decrease Outcome: Progressing   Problem: Cardiac: Goal: Will achieve  and/or maintain hemodynamic stability Outcome: Progressing   Problem: Clinical Measurements: Goal: Postoperative complications will be avoided or minimized Outcome: Not Progressing   Problem: Respiratory: Goal: Respiratory status will improve Outcome: Progressing   Problem: Skin Integrity: Goal: Wound healing without signs and symptoms of infection Outcome: Progressing Goal: Risk for impaired skin integrity will decrease Outcome: Progressing   Problem: Urinary Elimination: Goal: Ability to achieve and maintain adequate renal perfusion and functioning will improve Outcome: Progressing

## 2023-06-03 NOTE — Transfer of Care (Signed)
 Immediate Anesthesia Transfer of Care Note  Patient: [name] [name]  Procedure(s) Performed: TRANSESOPHAGEAL ECHOCARDIOGRAM CARDIOVERSION  Patient Location: PACU  Anesthesia Type:MAC  Level of Consciousness: sedated  Airway & Oxygen Therapy: Patient Spontanous Breathing  Post-op Assessment: Report given to RN  Post vital signs: Reviewed and stable  Last Vitals:  Vitals Value Taken Time  BP 94/72   Temp    Pulse 60   Resp 22   SpO2 95     Last Pain:  Vitals:   06/03/23 0731  TempSrc: Temporal  PainSc:       Patients Stated Pain Goal: 0 (05/31/23 2215)  Complications: No notable events documented.

## 2023-06-03 NOTE — CV Procedure (Addendum)
   TRANSESOPHAGEAL ECHOCARDIOGRAM GUIDED DIRECT CURRENT CARDIOVERSION  NAME:  [name] [name]   MRN: [id number] DOB:  [birth year]   ADMIT DATE: 05/27/2023  INDICATIONS: Symptomatic atrial fibrillation  PROCEDURE:   Informed consent was obtained prior to the procedure. The risks, benefits and alternatives for the procedure were discussed and the patient comprehended these risks.  Risks include, but are not limited to, cough, sore throat, vomiting, nausea, somnolence, esophageal and stomach trauma or perforation, bleeding, low blood pressure, aspiration, pneumonia, infection, trauma to the teeth and death.    After a procedural time-out,  the patient was sedated by the anesthesia service. The transesophageal probe was inserted in the esophagus and stomach without difficulty and multiple views were obtained. Anesthesia was monitored by Dr. Hart [place]. Patient received 40 mg IV lidocaine and 272.55 mg IV propofol. He also received 1927.5 mcg phenylephrine.  COMPLICATIONS:    Complications: No complications Patient tolerated procedure well.  FINDINGS:  LEFT VENTRICLE: EF = ~35%. Global hypokinesis with some septal dyskinesis.  RIGHT VENTRICLE: moderately reduced function, mildly enlarged size  LEFT ATRIUM: No thrombus/mass. S/P atriclip. No residual flow seen into LAA. Moderately dilated.  LEFT ATRIAL APPENDAGE: No thrombus/mass. S/P atriclip. No residual flow seen into LAA.  RIGHT ATRIUM: No thrombus/mass. Moderately dilated.  AORTIC VALVE:  Trileaflet, sclerotic. Trivial regurgitation. No vegetation.  MITRAL VALVE:    S/P annuloplasty ring. Very trivial regurgitation. No vegetation.  TRICUSPID VALVE: S/P annuloplasty ring. Mild-moderate regurgitation. No vegetation.  PULMONIC VALVE: Grossly normal structure. Moderate regurgitation. No apparent vegetation.  INTERATRIAL SEPTUM: Very trivial color flow at thinnest aspect of septum concerning for possible PFO, but not definitively seen. No  right to left flow by agitated saline contrast  PERICARDIUM: Small to moderate pericardial effusion seen adjacent to RV and LV  DESCENDING AORTA: Mild scattered plaque seen   CARDIOVERSION:     Indications:  Symptomatic Atrial Fibrillation  Procedure Details:  Once the TEE was complete, the patient had the defibrillator pads placed in the anterior and posterior position. Once an appropriate level of sedation was confirmed, the patient was cardioverted x 1 with 200J of biphasic synchronized energy.  The patient converted to epicardially paced rhythm at 50 bpm. Pacer was turned up to 60 bpm.  There were no apparent complications.  The patient had normal neuro status and respiratory status post procedure with vitals stable as recorded elsewhere.  Adequate airway was maintained throughout and vital signs monitored per protocol.  Jodelle Red, MD, PhD, [facility]  [facility]  [facility] [address], [address] [place], [place] [address] [phone]   8:53 AM

## 2023-06-03 NOTE — Anesthesia Postprocedure Evaluation (Signed)
 Anesthesia Post Note  Patient: [name] [name]  Procedure(s) Performed: TRANSESOPHAGEAL ECHOCARDIOGRAM CARDIOVERSION     Patient location during evaluation: PACU Anesthesia Type: General Level of consciousness: awake and alert Pain management: pain level controlled Vital Signs Assessment: post-procedure vital signs reviewed and stable Respiratory status: spontaneous breathing, nonlabored ventilation, respiratory function stable and patient connected to nasal cannula oxygen Cardiovascular status: blood pressure returned to baseline and stable Postop Assessment: no apparent nausea or vomiting Anesthetic complications: no  No notable events documented.  Last Vitals:  Vitals:   06/03/23 0925 06/03/23 0930  BP: 105/70 101/71  Pulse: 60 61  Resp: (!) 22 16  Temp:    SpO2: 97% 90%    Last Pain:  Vitals:   06/03/23 0905  TempSrc:   PainSc: 0-No pain                 [name]

## 2023-06-03 NOTE — Progress Notes (Signed)
  Echocardiogram Echocardiogram Transesophageal has been performed.  Janalyn Harder 06/03/2023, 9:21 AM

## 2023-06-04 ENCOUNTER — Telehealth: Payer: Self-pay

## 2023-06-04 ENCOUNTER — Inpatient Hospital Stay (HOSPITAL_COMMUNITY): Payer: Medicare Other

## 2023-06-04 DIAGNOSIS — I4891 Unspecified atrial fibrillation: Secondary | ICD-10-CM | POA: Diagnosis not present

## 2023-06-04 DIAGNOSIS — I5021 Acute systolic (congestive) heart failure: Secondary | ICD-10-CM | POA: Diagnosis not present

## 2023-06-04 DIAGNOSIS — E871 Hypo-osmolality and hyponatremia: Secondary | ICD-10-CM | POA: Diagnosis not present

## 2023-06-04 DIAGNOSIS — R739 Hyperglycemia, unspecified: Secondary | ICD-10-CM | POA: Diagnosis not present

## 2023-06-04 LAB — CBC
HCT: 30.8 % — ABNORMAL LOW (ref 39.0–52.0)
Hemoglobin: 10.4 g/dL — ABNORMAL LOW (ref 13.0–17.0)
MCH: 30.5 pg (ref 26.0–34.0)
MCHC: 33.8 g/dL (ref 30.0–36.0)
MCV: 90.3 fL (ref 80.0–100.0)
Platelets: 278 10*3/uL (ref 150–400)
RBC: 3.41 MIL/uL — ABNORMAL LOW (ref 4.22–5.81)
RDW: 13.2 % (ref 11.5–15.5)
WBC: 11.9 10*3/uL — ABNORMAL HIGH (ref 4.0–10.5)
nRBC: 0 % (ref 0.0–0.2)

## 2023-06-04 LAB — BASIC METABOLIC PANEL
Anion gap: 6 (ref 5–15)
BUN: 29 mg/dL — ABNORMAL HIGH (ref 8–23)
CO2: 19 mmol/L — ABNORMAL LOW (ref 22–32)
Calcium: 8 mg/dL — ABNORMAL LOW (ref 8.9–10.3)
Chloride: 105 mmol/L (ref 98–111)
Creatinine, Ser: 0.98 mg/dL (ref 0.61–1.24)
GFR, Estimated: >60 mL/min (ref >60)
Glucose, Bld: 117 mg/dL — ABNORMAL HIGH (ref 70–99)
Potassium: 3.5 mmol/L (ref 3.5–5.1)
Sodium: 130 mmol/L — ABNORMAL LOW (ref 135–145)

## 2023-06-04 LAB — GLUCOSE, CAPILLARY
Glucose-Capillary: 218 mg/dL — ABNORMAL HIGH (ref 70–99)
Glucose-Capillary: 137 mg/dL — ABNORMAL HIGH (ref 70–99)
Glucose-Capillary: 136 mg/dL — ABNORMAL HIGH (ref 70–99)
Glucose-Capillary: 112 mg/dL — ABNORMAL HIGH (ref 70–99)

## 2023-06-04 LAB — MAGNESIUM: Magnesium: 2.1 mg/dL (ref 1.7–2.4)

## 2023-06-04 MED ORDER — METOPROLOL SUCCINATE ER 25 MG PO TB24
25.0000 mg | ORAL_TABLET | Freq: Every day | ORAL | Status: DC
  Administered 2023-06-04 – 2023-06-05 (×2): 25 mg via ORAL
  Filled 2023-06-04 (×2): qty 1

## 2023-06-04 MED ORDER — AMIODARONE HCL 200 MG PO TABS
400.0000 mg | ORAL_TABLET | Freq: Two times a day (BID) | ORAL | Status: DC
  Administered 2023-06-04 (×2): 400 mg via ORAL
  Filled 2023-06-04 (×3): qty 2

## 2023-06-04 MED ORDER — POTASSIUM CHLORIDE CRYS ER 20 MEQ PO TBCR
20.0000 meq | EXTENDED_RELEASE_TABLET | ORAL | Status: AC
  Administered 2023-06-04 (×3): 20 meq via ORAL
  Filled 2023-06-04 (×3): qty 1

## 2023-06-04 NOTE — Progress Notes (Signed)
 Patient ID: [name] [name], male   DOB: 10-09-1945, 78 y.o.   MRN: [id number]  TCTS Evening Rounds:  Hemodynamically stable Atrial fib with RVR 110 on IV amio.  UO ok  Sats 93% on RA.

## 2023-06-04 NOTE — Progress Notes (Addendum)
  Patient Name: [name] [name] Date of Encounter: 06/04/2023  Primary Cardiologist: [name], MD Electrophysiologist: Lewayne Bunting, MD  Interval Summary   Feeling washed out this am. Shoulder feels OK s/p PPM.   Vital Signs    Vitals:   06/04/23 0400 06/04/23 0500 06/04/23 0600 06/04/23 0700  BP: 101/68 111/71 103/67 110/88  Pulse: 84 90 92 97  Resp: (!) 23 (!) 23 (!) 22 (!) 28  Temp: 97.9 F (36.6 C)     TempSrc: Oral     SpO2: 95% 91% 93% 96%  Weight:  88.5 kg    Height:        Intake/Output Summary (Last 24 hours) at 06/04/2023 0834 Last data filed at 06/04/2023 0700 Gross per 24 hour  Intake 1443.67 ml  Output 775 ml  Net 668.67 ml   Filed Weights   06/02/23 0500 06/03/23 0705 06/04/23 0500  Weight: 91 kg 90.5 kg 88.5 kg    Physical Exam    GEN- The patient is fatigued appearing, alert and oriented x 3 today.   Lungs- Clear to ausculation bilaterally, normal work of breathing Cardiac- Irregularly irregular rate and rhythm, no murmurs, rubs or gallops GI- soft, NT, ND, + BS Extremities- no clubbing or cyanosis. No edema  Telemetry    AF 90-110s (personally reviewed)  Hospital Course    DAOUD LOBUE is a 78 y.o. male admitted for planned MAZE/MV/TV repair, post operative course complicated by SND with bradycardia, and now AF with RVR   Assessment & Plan    SND Persistent AF with RVR S/p AAI PPM 2/25 by Dr. Ladona Ridgel.  Change lopressor to Toprol with low EF.  Continue IV amiodarone today.  Back in fib overnight Discussed with Dr. Ladona Ridgel. Continue to hold eliquis today.  If remains in AF, will resume at half dose tomorrow x 2 days, then full dose Friday.  If converts to sinus (<48 hrs) will continue to hold [facility] for 2-3 days post PPM.   Leave pressure dressing on for now. Will take off tomorrow or Friday pending disposition.   S/p MV and TV repair Otherwise stable, s/p AAI PPM 2/25.   Acute systolic CHF EF 30-35% 2/21 (down from 55%  02/14/2023) Potentially in the setting of valvular disease and tachycardia.  Poor candidate for ICD lead at this time with new Tricuspid repair  For questions or updates, please contact [facility] Please consult [email or web address] for contact info under Cardiology/STEMI.  Signed, Graciella Freer, PA-C  06/04/2023, 8:34 AM    EP Attending  Patient seen and examined. He is stable after PPM insertion. He reverted back to atrial fib. He underwent TEE guided DCCV yesterday as well with no clot. He will be continued on amiodarone. On exam he is stable and in NAD. Lungs were clear. Ext with no edema. Tele afib with a controlled/RVR. A/P PAF - he will be restarted on eliuqis on 2/28. Hopefully he will revert to NSR on amio. PPM - interrogation of his Single chamber PPM demonstrates normal device function.  Coags - restart eliquis on 06/06/23.  Sharlot Gowda Evalie Hargraves,MD

## 2023-06-04 NOTE — Telephone Encounter (Signed)
 Follow-up after same day discharge: Implant date: 06/03/2023 MD: Lewayne Bunting, MD Device: PPM SJM  Location: L CHEST   Wound check visit: YES 90 day MD follow-up: YES  Remote Transmission received:NO   Dressing/sling removed: N/A  Confirm OAC restart on: N/A  Please continue to monitor your cardiac device site for redness, swelling, and drainage. Call the device clinic at [phone] if you experience these symptoms, fever/chills, or have questions about your device.   Remote monitoring is used to monitor your cardiac device from home. This monitoring is scheduled every 91 days by our office. It allows [place] to keep an eye on the functioning of your device to ensure it is working properly.   LVM for pt to call back

## 2023-06-04 NOTE — Progress Notes (Signed)
 [address]       [place] [address]             [phone]      1 Day Post-Op  Procedure(s) (LRB): PACEMAKER IMPLANT (N/A)   Total Length of Stay:  LOS: 8 days    SUBJECTIVE: PPM yesterday Rapid atrial fibrillation Didn't sleep much  Vitals:   06/04/23 0600 06/04/23 0700  BP: 103/67   Pulse: 92 97  Resp: (!) 22 (!) 28  Temp:    SpO2: 93% 96%    Intake/Output      02/25 0701 02/26 0700 02/26 0701 02/27 0700   P.O. 500    I.V. (mL/kg) 649.9 (7.3)    IV Piggyback 210.5    Total Intake(mL/kg) 1360.4 (15.4)    Urine (mL/kg/hr) 775 (0.4)    Stool     Total Output 775    Net +585.4             sodium chloride     albumin human Stopped (05/28/23 0700)   amiodarone 30 mg/hr (06/04/23 0500)    CBC    Component Value Date/Time   WBC 11.9 (H) 06/04/2023 0512   RBC 3.41 (L) 06/04/2023 0512   HGB 10.4 (L) 06/04/2023 0512   HGB 14.6 02/06/2023 1117   HCT 30.8 (L) 06/04/2023 0512   HCT 44.8 02/06/2023 1117   PLT 278 06/04/2023 0512   PLT 228 02/06/2023 1117   MCV 90.3 06/04/2023 0512   MCV 94 02/06/2023 1117   MCH 30.5 06/04/2023 0512   MCHC 33.8 06/04/2023 0512   RDW 13.2 06/04/2023 0512   RDW 12.1 02/06/2023 1117   LYMPHSABS 1,093 12/06/2022 0919   MONOABS 432 07/29/2016 0837   EOSABS 148 04/07/2023 0840   BASOSABS 30 04/07/2023 0840   CMP     Component Value Date/Time   NA 130 (L) 06/04/2023 0512   NA 146 (H) 03/10/2023 1419   K 3.5 06/04/2023 0512   CL 105 06/04/2023 0512   CO2 19 (L) 06/04/2023 0512   GLUCOSE 117 (H) 06/04/2023 0512   BUN 29 (H) 06/04/2023 0512   BUN 17 03/10/2023 1419   CREATININE 0.98 06/04/2023 0512   CREATININE 1.06 04/07/2023 0840   CALCIUM 8.0 (L) 06/04/2023 0512   PROT 6.7 05/23/2023 1108   ALBUMIN 3.7 05/23/2023 1108   AST 23 05/23/2023 1108   ALT 20 05/23/2023 1108   ALKPHOS 53 05/23/2023 1108   BILITOT 1.4 (H) 05/23/2023 1108   GFRNONAA >60 06/04/2023 0512   GFRNONAA 73 06/28/2020  0817   GFRAA 85 06/28/2020 0817   ABG    Component Value Date/Time   PHART 7.510 (H) 06/01/2023 2230   PCO2ART 41.1 06/01/2023 2230   PO2ART 63 (L) 06/01/2023 2230   HCO3 32.8 (H) 06/01/2023 2230   TCO2 34 (H) 06/01/2023 2230   ACIDBASEDEF 5.0 (H) 05/27/2023 1859   O2SAT 94 06/01/2023 2230   CBG (last 3)  Recent Labs    06/02/23 2229 06/03/23 1118 06/03/23 2135  GLUCAP 100* 81 133*  Exam Lungs: clear Card: Tachy Ext: warm Neuro: intact  ASSESSMENT: SP MV and TV repair /MAZE SP PPM Atrial fibrillation: now back on BB and amiodarone drip. Will add PO and transition off IV over the day and if can keep HR under control will go and start anticoagulation with eliquis when ok with EP.  Remove epicardial pws Low dose diuretic Ambulate Hopefully home next 48-72 hrs  Eugenio Hoes, MD 06/04/2023

## 2023-06-04 NOTE — Progress Notes (Signed)
 NAME:  [name] [name], MRN:  [id number], DOB:  1945/11/23, LOS: 8 ADMISSION DATE:  05/27/2023, CONSULTATION DATE:  2/18 REFERRING MD:  Leafy Ro, CHIEF COMPLAINT:  post op critical care support    History of Present Illness:  78 year old male w/ medical hx as outlined below. Followed by both cards and CVTS for mod/svr MR w/ progressive fatigue. Presented to [facility] 2/18 for elective MR/TR and MAZE Intra-op echo 45-50% EF, LV global hypokinesis, mildly elevated PAS,  RV mildly enlarged.  Bypass started 0852 ended 1118  cell saver  To CVICU post op  PCCM asked to assist w/ post op care  Pertinent  Medical History  Severe MR, mod TR, HFmrEF (EF 55%), Dysrhythmia, HL, PAF s/p ablation 2012 (on xarelto), Prior CVA,chronic UTIs, prostate cancer s/p prostatectomy  Significant Hospital Events: Including procedures, antibiotic start and stop dates in addition to other pertinent events    2/18 MAZE, MV repair, TV repair, extubated overnight 2/25 cardioversion, back in bradycardic rhythm requiring pacing. PPM placed.  Interim History / Subjective:  Cardioverted this morning, then requiring pacing. Still fatigued.  Objective   Blood pressure 105/72, pulse 95, temperature 98.2 F (36.8 C), temperature source Oral, resp. rate 19, height 5\' 10"  (1.778 m), weight 88.5 kg, SpO2 96%.        Intake/Output Summary (Last 24 hours) at 06/04/2023 1732 Last data filed at 06/04/2023 1700 Gross per 24 hour  Intake 827.39 ml  Output 1225 ml  Net -397.61 ml   Filed Weights   06/02/23 0500 06/03/23 0705 06/04/23 0500  Weight: 91 kg 90.5 kg 88.5 kg    Examination: General: elderly man sitting in the chair in NAD HENT: [place]/AT, eyes anicteric lungs: breathing comfortably on [place], faint basilar rhales, no conversational dyspnea Cardiovascular: S1S2, paced rhythm. This afternoon back in rate controlled Afib. abdomen: soft, NT Extremities: mild ankle edema, LUE in sling. Neuro: awake, alert, globally weak.   Derm: warm, dry, no rashes  Na+ 130 K+ 3.5 BUN 29 Cr 0.98 WBC 11.9 H/H 10.4/30.8 Platelets 278  I/O +700cc  -8.9L for admission  Resolved Hospital Problem list     Assessment & Plan:  H/o Severe mitral regurgitation, tricuspid regurgitation, Afib, s/p MV and TV repair w/ MAZE on 2/18 Acute HFrEF post-op Persistent Afib, sinus node dysfunction with tachy-brady syndrome post-MAZE. Now sinus noed dysfunction--frequently in Afib with RVR and severe bradycardia requiring pacing, s/p single chamber PPM. -Post-op care per TCTS & EP. LUE sling, arm restrictions per post-PPM protocol. -diuretics -aspirin, statin -holding AC today; since he is back in Afib will likely start low dose DOAC tomorrow-- defer to EP.  - progressive mobility as able -con't spirolactone and optimize GDMT as tolerated  Hyperglycemia; h/o pre-DM. A1c 5.8 -SSI PRN -goal BG 140-180  AKI, resolved Hypervolemic hyponatremia -diuresis -consider liberalizing diet  -renally dose meds, avoid nephrotoxic meds  ABLA- expected post-op Consumptive thrombocytopenia- expected post-op, recovering -monitor  H/o HTN & HFrEF  -con't holding PTA toprol and losartan  Chronic pain -pain control per protocol; on tramadol PTA  Fatigue, situational depression -PT, OT -day night orientation -start zoloft this admission at his request -pain control per protocol   Best Practice (right click and "Reselect all SmartList Selections" daily)   Diet/type: Regular consistency (see orders) DVT prophylaxis SCD Pressure ulcer(s): N/A GI prophylaxis: PPI Lines: N/A Foley:  N/A Code Status:  full code Last date of multidisciplinary goals of care discussion [per primary ]  Labs   CBC:  Recent Labs  Lab 06/01/23 0145 06/01/23 0952 06/01/23 2230 06/02/23 0345 06/03/23 1031 06/04/23 0512  WBC 9.6 9.9  --  11.2* 12.5* 11.9*  HGB 10.8* 11.0* 11.6* 10.7* 10.9* 10.4*  HCT 32.3* 32.8* 34.0* 32.1* 33.0* 30.8*  MCV 90.5  90.4  --  90.2 92.2 90.3  PLT 170 182  --  209 272 278    Basic Metabolic Panel: Recent Labs  Lab 05/29/23 0500 05/30/23 0727 06/01/23 0952 06/01/23 2230 06/01/23 2342 06/02/23 0345 06/02/23 1524 06/03/23 0221 06/04/23 0512  NA 135   < > 133* 132*  --  134* 135 134* 130*  K 4.4   < > 3.2* 3.2* 3.4* 3.3* 3.5 3.4* 3.5  CL 103   < > 92*  --   --  94* 95* 99 105  CO2 22   < > 31  --   --  29 27 26  19*  GLUCOSE 107*   < > 113*  --   --  102* 182* 101* 117*  BUN 26*   < > 30*  --   --  30* 30* 28* 29*  CREATININE 1.18   < > 0.98  --   --  1.02 1.15 1.00 0.98  CALCIUM 8.1*   < > 7.9*  --   --  8.1* 8.3* 8.3* 8.0*  MG 2.5*  --   --   --  2.1 2.0  --  2.0 2.1   < > = values in this interval not displayed.   GFR: Estimated Creatinine Clearance: 67.3 mL/min (by C-G formula based on SCr of 0.98 mg/dL). Recent Labs  Lab 06/01/23 0952 06/02/23 0345 06/03/23 1031 06/04/23 0512  WBC 9.9 11.2* 12.5* 11.9*        Critical care time:       Steffanie Dunn, DO 06/04/23 5:32 PM [facility]  For contact information, see Amion. If no response to pager, please call PCCM consult pager. After hours, 7PM- 7AM, please call Elink.

## 2023-06-05 ENCOUNTER — Other Ambulatory Visit (HOSPITAL_COMMUNITY): Payer: Self-pay

## 2023-06-05 ENCOUNTER — Inpatient Hospital Stay (HOSPITAL_COMMUNITY): Payer: Medicare Other

## 2023-06-05 ENCOUNTER — Telehealth (HOSPITAL_COMMUNITY): Payer: Self-pay | Admitting: Pharmacy Technician

## 2023-06-05 DIAGNOSIS — I5021 Acute systolic (congestive) heart failure: Secondary | ICD-10-CM | POA: Diagnosis not present

## 2023-06-05 DIAGNOSIS — D62 Acute posthemorrhagic anemia: Secondary | ICD-10-CM

## 2023-06-05 DIAGNOSIS — Z9889 Other specified postprocedural states: Secondary | ICD-10-CM | POA: Diagnosis not present

## 2023-06-05 DIAGNOSIS — E871 Hypo-osmolality and hyponatremia: Secondary | ICD-10-CM | POA: Diagnosis not present

## 2023-06-05 DIAGNOSIS — R739 Hyperglycemia, unspecified: Secondary | ICD-10-CM | POA: Diagnosis not present

## 2023-06-05 DIAGNOSIS — I4891 Unspecified atrial fibrillation: Secondary | ICD-10-CM | POA: Diagnosis not present

## 2023-06-05 DIAGNOSIS — D72829 Elevated white blood cell count, unspecified: Secondary | ICD-10-CM

## 2023-06-05 LAB — CBC
HCT: 30.6 % — ABNORMAL LOW (ref 39.0–52.0)
Hemoglobin: 10.3 g/dL — ABNORMAL LOW (ref 13.0–17.0)
MCH: 30.7 pg (ref 26.0–34.0)
MCHC: 33.7 g/dL (ref 30.0–36.0)
MCV: 91.1 fL (ref 80.0–100.0)
Platelets: 297 10*3/uL (ref 150–400)
RBC: 3.36 MIL/uL — ABNORMAL LOW (ref 4.22–5.81)
RDW: 13.3 % (ref 11.5–15.5)
WBC: 14 10*3/uL — ABNORMAL HIGH (ref 4.0–10.5)
nRBC: 0 % (ref 0.0–0.2)

## 2023-06-05 LAB — GLUCOSE, CAPILLARY
Glucose-Capillary: 139 mg/dL — ABNORMAL HIGH (ref 70–99)
Glucose-Capillary: 223 mg/dL — ABNORMAL HIGH (ref 70–99)
Glucose-Capillary: 106 mg/dL — ABNORMAL HIGH (ref 70–99)
Glucose-Capillary: 141 mg/dL — ABNORMAL HIGH (ref 70–99)
Glucose-Capillary: 140 mg/dL — ABNORMAL HIGH (ref 70–99)

## 2023-06-05 LAB — BASIC METABOLIC PANEL
Anion gap: 13 (ref 5–15)
BUN: 25 mg/dL — ABNORMAL HIGH (ref 8–23)
CO2: 20 mmol/L — ABNORMAL LOW (ref 22–32)
Calcium: 8.6 mg/dL — ABNORMAL LOW (ref 8.9–10.3)
Chloride: 101 mmol/L (ref 98–111)
Creatinine, Ser: 0.92 mg/dL (ref 0.61–1.24)
GFR, Estimated: >60 mL/min (ref >60)
Glucose, Bld: 112 mg/dL — ABNORMAL HIGH (ref 70–99)
Potassium: 3.5 mmol/L (ref 3.5–5.1)
Sodium: 134 mmol/L — ABNORMAL LOW (ref 135–145)

## 2023-06-05 LAB — URINALYSIS, ROUTINE W REFLEX MICROSCOPIC
Bilirubin Urine: NEGATIVE
Glucose, UA: 50 mg/dL — AB
Ketones, ur: NEGATIVE mg/dL
Leukocytes,Ua: NEGATIVE
Nitrite: NEGATIVE
Protein, ur: NEGATIVE mg/dL
Specific Gravity, Urine: 1.02 (ref 1.005–1.030)
pH: 6 (ref 5.0–8.0)

## 2023-06-05 LAB — PROCALCITONIN: Procalcitonin: <0.1 ng/mL

## 2023-06-05 LAB — MAGNESIUM: Magnesium: 1.9 mg/dL (ref 1.7–2.4)

## 2023-06-05 MED ORDER — POTASSIUM CHLORIDE CRYS ER 20 MEQ PO TBCR
20.0000 meq | EXTENDED_RELEASE_TABLET | ORAL | Status: AC
  Administered 2023-06-05 (×3): 20 meq via ORAL
  Filled 2023-06-05 (×3): qty 1

## 2023-06-05 MED ORDER — POLYETHYLENE GLYCOL 3350 17 G PO PACK
17.0000 g | PACK | Freq: Every day | ORAL | Status: DC
  Administered 2023-06-05 – 2023-06-09 (×3): 17 g via ORAL
  Filled 2023-06-05 (×5): qty 1

## 2023-06-05 MED ORDER — APIXABAN 5 MG PO TABS
5.0000 mg | ORAL_TABLET | Freq: Two times a day (BID) | ORAL | Status: DC
Start: 2023-06-07 — End: 2023-06-09
  Administered 2023-06-07 – 2023-06-09 (×4): 5 mg via ORAL
  Filled 2023-06-05 (×4): qty 1

## 2023-06-05 MED ORDER — LACTULOSE 10 GM/15ML PO SOLN: 20.0000 g | Freq: Every day | ORAL | Status: DC | PRN

## 2023-06-05 MED ORDER — MAGNESIUM GLUCONATE 500 MG PO TABS
500.0000 mg | ORAL_TABLET | Freq: Every day | ORAL | Status: DC
  Administered 2023-06-05 – 2023-06-11 (×6): 500 mg via ORAL
  Filled 2023-06-05 (×8): qty 1

## 2023-06-05 MED ORDER — AMIODARONE HCL 200 MG PO TABS
400.0000 mg | ORAL_TABLET | Freq: Three times a day (TID) | ORAL | Status: AC
  Administered 2023-06-05 – 2023-06-06 (×6): 400 mg via ORAL
  Filled 2023-06-05 (×5): qty 2

## 2023-06-05 MED ORDER — DAPAGLIFLOZIN PROPANEDIOL 10 MG PO TABS
10.0000 mg | ORAL_TABLET | Freq: Every day | ORAL | Status: DC
  Administered 2023-06-05 – 2023-06-09 (×5): 10 mg via ORAL
  Filled 2023-06-05 (×5): qty 1

## 2023-06-05 MED ORDER — APIXABAN 2.5 MG PO TABS
2.5000 mg | ORAL_TABLET | Freq: Two times a day (BID) | ORAL | Status: AC
  Administered 2023-06-05 – 2023-06-07 (×4): 2.5 mg via ORAL
  Filled 2023-06-05 (×4): qty 1

## 2023-06-05 MED ORDER — SENNOSIDES-DOCUSATE SODIUM 8.6-50 MG PO TABS
1.0000 | ORAL_TABLET | Freq: Every day | ORAL | Status: DC
  Administered 2023-06-05 – 2023-06-09 (×5): 1 via ORAL
  Filled 2023-06-05 (×5): qty 1

## 2023-06-05 NOTE — Progress Notes (Addendum)
  Patient Name: [name] [name] Date of Encounter: 06/05/2023  Primary Cardiologist: [name], MD Electrophysiologist: Lewayne Bunting, MD  Interval Summary   The patient is doing well today.  At this time, the patient denies chest pain, shortness of breath, or any new concerns.  Vital Signs    Vitals:   06/05/23 0400 06/05/23 0500 06/05/23 0600 06/05/23 0700  BP: 108/79 111/68 103/76 108/73  Pulse: 94 (!) 101 (!) 102 100  Resp: 20 19 20 18   Temp: 98 F (36.7 C)     TempSrc: Oral     SpO2: 92% 91% 94% 96%  Weight:  88.1 kg    Height:        Intake/Output Summary (Last 24 hours) at 06/05/2023 0734 Last data filed at 06/05/2023 0700 Gross per 24 hour  Intake 696.78 ml  Output 800 ml  Net -103.22 ml   Filed Weights   06/03/23 0705 06/04/23 0500 06/05/23 0500  Weight: 90.5 kg 88.5 kg 88.1 kg    Physical Exam    GEN- The patient is well appearing, alert and oriented x 3 today.   Lungs- Clear to ausculation bilaterally, normal work of breathing Cardiac- Irregularly irregular rate and rhythm, no murmurs, rubs or gallops GI- soft, NT, ND, + BS Extremities- no clubbing or cyanosis. No edema  Telemetry    AF 90-110s (personally reviewed)  Hospital Course    [name] [name] is a 78 y.o. male admitted for [name] [name] is a 78 y.o. male admitted for planned MAZE/MV/TV repair, post operative course complicated by SND with bradycardia, and now AF with RVR   Assessment & Plan    SND Persistent AF with RVR S/p AAI PPM 2/25 by Dr. Ladona Ridgel.  Continue toprol 25 mg daily, can titrate as needed/tolerated.  Continue IV amiodarone.  Pt remains in AF Resume Eliquis tonight at 2.5 mg BID x 4 doses, then back to 5 mg BID.  Keep pressure dressing on today again if staying.    S/p MV and TV repair Otherwise stable, s/p AAI PPM 2/25.   Acute systolic CHF EF 30-35% 2/21 (down from 55% 02/14/2023) Potentially in the setting of valvular disease and tachycardia.  Poor candidate  for ICD lead at this time with new Tricuspid repair Continue toprol and spiro No room yet for ACE/ARB and also had AKI.   Ok for floor from EP perspective.   For questions or updates, please contact [facility] Please consult [email or web address] for contact info under Cardiology/STEMI.  Signed, Graciella Freer, PA-C  06/05/2023, 7:34 AM   I have seen, examined the patient, and reviewed the above assessment and plan.    Interval: No acute overnight events. Patient reports feeling better today. Continues to be in atrial fibrillation, primarily rate controlled.   GEN: No acute distress.   Cardiac: Tachycardic, irregular. Left chest pressure dressing in place. Psych: Normal affect   Assessment:  #Postoperative atrial fibrillation: Has been paroxysmal since cardioversion.  -Continue IV amiodarone. Will transition to oral when ready for discharge.  -Restart Eliquis today. He is s/p LAAC. Keep pressure dressing in place.   #Sinus node dysfunction s/p AAI PPM on 2/25.  - Appropriate device function and stable lead parameters. Left chest pressure dressing in place.   Nobie Putnam, MD 06/05/2023 8:11 AM

## 2023-06-05 NOTE — Telephone Encounter (Signed)
 Patient Product/process development scientist completed.    The patient is insured through [facility]. Patient has Medicare and is not eligible for a copay card, but may be able to apply for patient assistance or Medicare RX Payment Plan (Patient Must reach out to their plan, if eligible for payment plan), if available.    Ran test claim for Farxiga 10 mg and the current 30 day co-pay is $45.00.  Ran test claim for Jardiance 10 mg and the current 30 day co-pay is $45.00.  This test claim was processed through [facility]- copay amounts may vary at other pharmacies due to pharmacy/plan contracts, or as the patient moves through the different stages of their insurance plan.     Roland Earl, CPHT Pharmacy Technician III Certified Patient Advocate [facility] Patient Advocate Team Direct Number: [phone]  Fax: [phone]

## 2023-06-05 NOTE — Progress Notes (Signed)
 2 Days Post-Op Procedure(s) (LRB): PACEMAKER IMPLANT (N/A) Subjective: Slowly feeling stronger but concerned about home situation,wife had recent knee replacement  Objective: Vital signs in last 24 hours: Temp:  [97.8 F (36.6 C)-98.8 F (37.1 C)] 98 F (36.7 C) (02/27 0400) Pulse Rate:  [84-125] 100 (02/27 0700) Cardiac Rhythm: Atrial fibrillation (02/27 0400) Resp:  [17-30] 18 (02/27 0700) BP: (94-150)/(61-112) 108/73 (02/27 0700) SpO2:  [86 %-96 %] 96 % (02/27 0700) Weight:  [88.1 kg] 88.1 kg (02/27 0500)  Hemodynamic parameters for last 24 hours:    Intake/Output from previous day: 02/26 0701 - 02/27 0700 In: 696.8 [P.O.:300; I.V.:396.8] Out: 800 [Urine:800] Intake/Output this shift: No intake/output data recorded.  General appearance: alert, cooperative, and no distress Heart: irregularly irregular rhythm Lungs: clear to auscultation bilaterally Abdomen: distended, soft,+ BS, non tender Extremities: no edema Wound: incis healing well  Lab Results: Recent Labs    06/04/23 0512 06/05/23 0345  WBC 11.9* 14.0*  HGB 10.4* 10.3*  HCT 30.8* 30.6*  PLT 278 297   BMET:  Recent Labs    06/04/23 0512 06/05/23 0345  NA 130* 134*  K 3.5 3.5  CL 105 101  CO2 19* 20*  GLUCOSE 117* 112*  BUN 29* 25*  CREATININE 0.98 0.92  CALCIUM 8.0* 8.6*    PT/INR: No results for input(s): "LABPROT", "INR" in the last 72 hours. ABG    Component Value Date/Time   PHART 7.510 (H) 06/01/2023 2230   HCO3 32.8 (H) 06/01/2023 2230   TCO2 34 (H) 06/01/2023 2230   ACIDBASEDEF 5.0 (H) 05/27/2023 1859   O2SAT 94 06/01/2023 2230   CBG (last 3)  Recent Labs    06/04/23 1613 06/04/23 2059 06/05/23 0649  GLUCAP 136* 112* 139*    Meds Scheduled Meds:  amiodarone  400 mg Oral BID   apixaban  2.5 mg Oral BID   Followed by   Melene Muller ON 06/07/2023] apixaban  5 mg Oral BID   aspirin  81 mg Oral Daily   atorvastatin  20 mg Oral Daily   bisacodyl  10 mg Oral Daily   Or    bisacodyl  10 mg Rectal Daily   chlorhexidine  60 mL Topical Once   chlorhexidine  60 mL Topical Once   Chlorhexidine Gluconate Cloth  6 each Topical Daily   docusate sodium  200 mg Oral Daily   feeding supplement  237 mL Oral BID BM   furosemide  40 mg Oral Daily   insulin aspart  0-24 Units Subcutaneous TID WC   insulin aspart  0-5 Units Subcutaneous QHS   metoprolol succinate  25 mg Oral Daily   pantoprazole  40 mg Oral Daily   potassium chloride  20 mEq Oral Q4H   sertraline  25 mg Oral Daily   sodium chloride flush  3 mL Intravenous Q12H   sodium chloride flush  3-10 mL Intravenous Q12H   sodium chloride flush  3-10 mL Intravenous Q12H   spironolactone  12.5 mg Oral Daily   Continuous Infusions:  albumin human Stopped (05/28/23 0700)   amiodarone 30 mg/hr (06/05/23 0700)   PRN Meds:.acetaminophen, albumin human, metoprolol tartrate, morphine injection, ondansetron (ZOFRAN) IV, mouth rinse, oxyCODONE, sodium chloride flush, sodium chloride flush, sodium chloride flush, traMADol  Xrays DG CHEST PORT 1 VIEW Result Date: 06/04/2023 CLINICAL DATA:  Pacemaker. EXAM: PORTABLE CHEST 1 VIEW COMPARISON:  June 03, 2023. FINDINGS: Stable cardiomegaly. Single lead left-sided pacemaker is unchanged. No pneumothorax. Status post cardiac valve repair. Both lungs  are clear. The visualized skeletal structures are unremarkable. IMPRESSION: No active disease. Electronically Signed   By: Lupita Raider M.D.   On: 06/04/2023 11:22   DG Chest Port 1 View Result Date: 06/03/2023 CLINICAL DATA:  Pacemaker EXAM: PORTABLE CHEST 1 VIEW COMPARISON:  06/02/2023 FINDINGS: Interval placement of left pacemaker. Single lead curls in the right atrium, possibly atrial appendage. No pneumothorax. Cardiomegaly. Continued left lower lobe atelectasis or infiltrate with small left effusion. Right lung clear. IMPRESSION: Interval placement of left pacer with single lead tip in the right atrium. No pneumothorax. Small  left effusion with left lower lobe atelectasis or infiltrate, unchanged. Electronically Signed   By: Charlett Nose M.D.   On: 06/03/2023 20:31   EP STUDY Result Date: 06/03/2023 See surgical note for result.  ECHO TEE Result Date: 06/03/2023    TRANSESOPHOGEAL ECHO REPORT   Patient Name:   [name] [name] Date of Exam: 06/03/2023 Medical Rec #:  [id number]      Height:       70.0 in Accession #:    [id number]     Weight:       199.5 lb Date of Birth:  [birth year]       BSA:          2.085 m Patient Age:    78 years       BP:           95/67 mmHg Patient Gender: M              HR:           101 bpm. Exam Location:  Inpatient Procedure: 3D Echo, Transesophageal Echo, Cardiac Doppler and Color Doppler            (Both Spectral and Color Flow Doppler were utilized during            procedure). Indications:     I48.91* Unspeicified atrial fibrillation  History:         Patient has prior history of Echocardiogram examinations, most                  recent 05/30/2023. Abnormal ECG, Mitral Valve Disease and                  Tricuspid valve repair, Arrythmias:Atrial Fibrillation and                  Bradycardia; Risk Factors:Dyslipidemia and Hypertension.                   Mitral Valve: prosthetic annuloplasty ring valve is present in                  the mitral position.  Sonographer:     Sheralyn Boatman RDCS Referring Phys:  [id number] BRIDGETTE CHRISTOPHER Diagnosing Phys: Jodelle Red MD PROCEDURE: After discussion of the risks and benefits of a TEE, an informed consent was obtained from the patient. The transesophogeal probe was passed without difficulty through the esophogus of the patient. Imaged were obtained with the patient in a left lateral decubitus position. Sedation performed by different physician. The patient was monitored while under deep sedation. Anesthestetic sedation was provided intravenously by Anesthesiology: 272.55mg  of Propofol, 40mg  of Lidocaine. The patient's vital signs; including heart rate,  blood pressure, and oxygen saturation; remained stable throughout the procedure. The patient developed no complications during the procedure. A successful direct current cardioversion was performed at 200 joules with 1 attempt.  IMPRESSIONS  1.  Left ventricular ejection fraction, by estimation, is 35 to 40%. The left ventricle has moderately decreased function. The left ventricular internal cavity size was mildly to moderately dilated.  2. Right ventricular systolic function is moderately reduced. The right ventricular size is mildly enlarged. There is mildly elevated pulmonary artery systolic pressure. The estimated right ventricular systolic pressure is 38.9 mmHg.  3. S/P LA appendage atriclip. Left atrial size was moderately dilated. No left atrial/left atrial appendage thrombus was detected.  4. Right atrial size was moderately dilated.  5. A small pericardial effusion is present. The pericardial effusion is circumferential.  6. The mitral valve has been repaired/replaced. Trivial mitral valve regurgitation. No evidence of mitral stenosis. There is a prosthetic annuloplasty ring present in the mitral position. Echo findings are consistent with normal structure and function of the mitral valve prosthesis.  7. The tricuspid valve is has been repaired/replaced. The tricuspid valve is status post repair with an annuloplasty ring. Tricuspid valve regurgitation is mild to moderate.  8. The aortic valve is tricuspid. There is mild calcification of the aortic valve. Aortic valve regurgitation is not visualized. Aortic valve sclerosis is present, with no evidence of aortic valve stenosis.  9. Pulmonic valve regurgitation is moderate. 10. Aortic dilatation noted. There is mild dilatation of the ascending aorta, measuring 43 mm. There is mild (Grade II) plaque involving the descending aorta. 11. Cannot exclude a small PFO. Agitated saline contrast bubble study was negative, with no evidence of any interatrial shunt. 12.  Mitral, Tricuspid, and LAA and demonstrates MVR/TVR stable, LAA appears clipped. Conclusion(s)/Recommendation(s): No LA/LAA thrombus identified. Successful cardioversion performed with restoration of normal sinus rhythm. FINDINGS  Left Ventricle: Left ventricular ejection fraction, by estimation, is 35 to 40%. The left ventricle has moderately decreased function. The left ventricular internal cavity size was mildly to moderately dilated. Right Ventricle: The right ventricular size is mildly enlarged. No increase in right ventricular wall thickness. Right ventricular systolic function is moderately reduced. There is mildly elevated pulmonary artery systolic pressure. The tricuspid regurgitant velocity is 2.78 m/s, and with an assumed right atrial pressure of 8 mmHg, the estimated right ventricular systolic pressure is 38.9 mmHg. Left Atrium: S/P LA appendage atriclip. Left atrial size was moderately dilated. No left atrial/left atrial appendage thrombus was detected. Right Atrium: Right atrial size was moderately dilated. Pericardium: A small pericardial effusion is present. The pericardial effusion is circumferential. Mitral Valve: The mitral valve has been repaired/replaced. Trivial mitral valve regurgitation. There is a prosthetic annuloplasty ring present in the mitral position. Echo findings are consistent with normal structure and function of the mitral valve prosthesis. No evidence of mitral valve stenosis. There is no evidence of mitral valve vegetation. Tricuspid Valve: The tricuspid valve is has been repaired/replaced. Tricuspid valve regurgitation is mild to moderate. No evidence of tricuspid stenosis. The tricuspid valve is status post repair with an annuloplasty ring. There is no evidence of tricuspid valve vegetation. Aortic Valve: The aortic valve is tricuspid. There is mild calcification of the aortic valve. Aortic valve regurgitation is not visualized. Aortic valve sclerosis is present, with no  evidence of aortic valve stenosis. There is no evidence of aortic valve  vegetation. Pulmonic Valve: The pulmonic valve was grossly normal. Pulmonic valve regurgitation is moderate. Aorta: Aortic dilatation noted. There is mild dilatation of the ascending aorta, measuring 43 mm. There is mild (Grade II) plaque involving the descending aorta. IAS/Shunts: Cannot exclude a small PFO. Agitated saline contrast was given intravenously to evaluate for intracardiac  shunting. Agitated saline contrast bubble study was negative, with no evidence of any interatrial shunt. Additional Comments: Spectral Doppler performed.  AORTA Ao Asc diam: 4.30 cm TRICUSPID VALVE TR Peak grad:   30.9 mmHg TR Vmax:        278.00 cm/s Jodelle Red MD Electronically signed by Jodelle Red MD Signature Date/Time: 06/03/2023/4:20:20 PM    Final    EP PPM/ICD IMPLANT Result Date: 06/03/2023 CONCLUSIONS:  1. Successful implantation of a [employer] single chamber pacemaker for symptomatic bradycardia due to sinus node dysfunction  2. No early apparent complications.       Lewayne Bunting, MD 06/03/2023 4:07 PM    Assessment/Plan: S/P Procedure(s) (LRB): PACEMAKER IMPLANT (N/A)  1 afeb,afib,HR improving- on amio gtt, s BP 90's-150,eliquis being started- will switch to po amio 400 TID per MD 2 O2 sats good on 2 liters,wean off , was on RA most of yesterday 3 weight below preop 4 fair UOP 5 BS well controlled 6 hyponatremia improved,sodium 134 7 creat normal,BUN now 25(trending towards normal) 8 Mg ++ 1.9-will replace to get >2.0 9 leukocytosis trending up , WBC 14 10 H/H stable 11 will get TOC involved ,SNF short term vs HH arrangements 12 will give lactulose for BM     LOS: 9 days    Rowe Clack PA-C Pager [phone] 06/05/2023

## 2023-06-05 NOTE — Progress Notes (Signed)
 Heart Failure Nurse Navigator Progress Note  PCP: [name], MD PCP-Cardiologist: [name] Admission Diagnosis: None.  Admitted from: Home  Presentation:   [name] [name] presented with symptomatic severe MR, TR and atrial fibrillation. Had a complex MV and TV repair with L/R side MAZE, on 2/25 had a PPM, complicated by GIB, A-fib with RVR and hemorrhagic shock. Had a colonoscopy on 3/8, patient deconditioned during hospital stay, plan at discharge for 2 weeks at [facility], starting 06/18/2023.   Patient was educated on the sign and symptoms of heart failure, daily weights, when to call his doctor or go to the ED. Diet/ fluid restrictions, taking all medications as prescribed and attending all medical appointments. Patient verbalized his understanding of education , a HF TOC appointment was scheduled for 07/10/2023 @ 9:30 am after his CIR estimated time frame.   ECHO/ LVEF: 35-40%  Clinical Course:  Past Medical History:  Diagnosis Date   Arthritis    Back   CVA (cerebral infarction) 07/14/2009   TIA   Dysrhythmia    A. Fib   Heart murmur    History of kidney stones    Hyperlipidemia    Hypertension    Nephrolithiasis    Paroxysmal atrial fibrillation (HCC)    Paroxysmal atrial flutter (HCC)    a. s/p ablation 2012.   Prostate cancer (HCC)    Stroke (HCC)    TIA - no deficits     Social History   Socioeconomic History   Marital status: Married    Spouse name: Not on file   Number of children: 2   Years of education: Not on file   Highest education level: Not on file  Occupational History   Occupation: Personnel officer: [employer]  Tobacco Use   Smoking status: Never   Smokeless tobacco: Never  Vaping Use   Vaping status: Never Used  Substance and Sexual Activity   Alcohol use: No   Drug use: No   Sexual activity: Yes  Other Topics Concern   Not on file  Social History Narrative   Not on file   Social Drivers of Health   Financial  Resource Strain: Low Risk  (10/31/2022)   Overall Financial Resource Strain (CARDIA)    Difficulty of Paying Living Expenses: Not hard at all  Food Insecurity: No Food Insecurity (05/28/2023)   Hunger Vital Sign    Worried About Running Out of Food in the Last Year: Never true    Ran Out of Food in the Last Year: Never true  Transportation Needs: No Transportation Needs (05/28/2023)   PRAPARE - Administrator, Civil Service (Medical): No    Lack of Transportation (Non-Medical): No  Physical Activity: Insufficiently Active (10/31/2022)   Exercise Vital Sign    Days of Exercise per Week: 3 days    Minutes of Exercise per Session: 20 min  Stress: No Stress Concern Present (10/31/2022)   [employer] of Occupational Health - Occupational Stress Questionnaire    Feeling of Stress : Not at all  Social Connections: Socially Integrated (05/28/2023)   Social Connection and Isolation Panel [NHANES]    Frequency of Communication with Friends and Family: More than three times a week    Frequency of Social Gatherings with Friends and Family: Three times a week    Attends Religious Services: More than 4 times per year    Active Member of Clubs or Organizations: Yes    Attends Banker  Meetings: 1 to 4 times per year    Marital Status: Married   Water engineer and Provision:  Detailed education and instructions provided on heart failure disease management including the following:  Signs and symptoms of Heart Failure When to call the physician Importance of daily weights Low sodium diet Fluid restriction Medication management Anticipated future follow-up appointments  Patient education given on each of the above topics.  Patient acknowledges understanding via teach back method and acceptance of all instructions.  Education Materials:  "Living Better With Heart Failure" Booklet, HF zone tool, & Daily Weight Tracker Tool.  Patient has scale at home: Yes Patient  has pill box at home: Yes    High Risk Criteria for Readmission and/or Poor Patient Outcomes: Heart failure hospital admissions (last 6 months): 0  No Show rate: 0 Difficult social situation: No, patient going to CIR for 2 weeks, his wife recently had ankle surgery  Demonstrates medication adherence: yes Primary Language: English Literacy level: reading, writing, and comprehension  Barriers of Care:   Regaining strength Diet/ fluid restrictions Daily weights   Considerations/Referrals:   Referral made to Heart Failure Pharmacist Stewardship: Yes Referral made to Heart Failure CSW/NCM TOC: No Referral made to Heart & Vascular TOC clinic: Yes, after CIR, scheduled 07/10/2023 @ 9:30 am   Items for Follow-up on DC/TOC: Continued HF education Diet/ fluid restrictions Daily weights   Rhae Hammock, BSN, RN Heart Failure Teacher, adult education Only

## 2023-06-05 NOTE — Evaluation (Signed)
 Physical Therapy Evaluation Patient Details Name: [name] [name] MRN: [id number] DOB: [birth year] Today's Date: 06/05/2023  History of Present Illness  78 y/o male admitted 2/18 for mitral and tricuspid valve repair, MAZE same date. 2/25 DCCV, PPM placed.  PMhx: Mitral regurgitation, paroxysmal Afib, CVA, HTN, HLD, Prostate CA, glaucoma  Clinical Impression  Pt pleasant with daughter present and educated for all sternal and PPM precautions. Pt has been assisting wife at home due to issues after her TKA as she is still using AD. Other family available intermittently but pt and family concerned with lack of support. [name] greatest difficulty is getting OOB and he does have recliner. Pt with decreased strength, function and gait who will benefit from acute therapy to maximize mobility and safety.   HR 90-125 with gait SPO2 90-95% on RA   106/75 (86)      If plan is discharge home, recommend the following: A little help with walking and/or transfers;A little help with bathing/dressing/bathroom;Assistance with cooking/housework;Assist for transportation;Help with stairs or ramp for entrance   Can travel by private vehicle   Yes    Equipment Recommendations Rolling walker (2 wheels);BSC/3in1  Recommendations for Other Services  OT consult    Functional Status Assessment Patient has had a recent decline in their functional status and demonstrates the ability to make significant improvements in function in a reasonable and predictable amount of time.     Precautions / Restrictions Precautions Precautions: Sternal;Fall;ICD/Pacemaker Precaution Booklet Issued: Yes (comment) Recall of Precautions/Restrictions: Impaired      Mobility  Bed Mobility Overal bed mobility: Needs Assistance Bed Mobility: Supine to Sit     Supine to sit: HOB elevated, Mod assist     General bed mobility comments: mod assist to semi roll and rise to right with HOB 20 degrees, increased time to scoot to EOB     Transfers Overall transfer level: Needs assistance   Transfers: Sit to/from Stand Sit to Stand: Contact guard assist           General transfer comment: cues for hand placement to rise from bed and toilet with mod assist to scoot back in recliner end of session    Ambulation/Gait Ambulation/Gait assistance: Supervision Gait Distance (Feet): 300 Feet Assistive device: Rolling walker (2 wheels) Gait Pattern/deviations: Step-through pattern, Decreased stride length   Gait velocity interpretation: <1.8 ft/sec, indicate of risk for recurrent falls   General Gait Details: cues for direction, increased stride and breathing technique. SPO2 90-95% on RA when pleth reading accurately  Stairs            Wheelchair Mobility     Tilt Bed    Modified Rankin (Stroke Patients Only)       Balance Overall balance assessment: Needs assistance Sitting-balance support: No upper extremity supported, Feet supported Sitting balance-Leahy Scale: Fair     Standing balance support: Bilateral upper extremity supported Standing balance-Leahy Scale: Poor                               Pertinent Vitals/Pain Pain Assessment Pain Assessment: 0-10 Pain Score: 2  Pain Location: incision Pain Descriptors / Indicators: Aching Pain Intervention(s): Limited activity within patient's tolerance, Monitored during session    Home Living Family/patient expects to be discharged to:: Private residence Living Arrangements: Spouse/significant other Available Help at Discharge: Family;Available PRN/intermittently Type of Home: House Home Access: Stairs to enter   Entrance Stairs-Number of Steps: 2   Home Layout:  Two level;Able to live on main level with bedroom/bathroom Home Equipment: Other (comment) Additional Comments: has upright walker    Prior Function Prior Level of Function : Independent/Modified Independent;Driving             Mobility Comments: was walking  independently and assisting wife after TKA       Extremity/Trunk Assessment   Upper Extremity Assessment Upper Extremity Assessment: Defer to OT evaluation    Lower Extremity Assessment Lower Extremity Assessment: Generalized weakness    Cervical / Trunk Assessment Cervical / Trunk Assessment: Normal  Communication   Communication Communication: No apparent difficulties    Cognition Arousal: Alert Behavior During Therapy: Flat affect   PT - Cognitive impairments: Memory                       PT - Cognition Comments: decreased recall of precautions Following commands: Intact       Cueing Cueing Techniques: Verbal cues, Gestural cues     General Comments      Exercises     Assessment/Plan    PT Assessment Patient needs continued PT services  PT Problem List Decreased strength;Decreased activity tolerance;Decreased balance;Decreased mobility;Decreased knowledge of use of DME;Decreased knowledge of precautions       PT Treatment Interventions DME instruction;Gait training;Stair training;Functional mobility training;Therapeutic activities;Patient/family education;Therapeutic exercise    PT Goals (Current goals can be found in the Care Plan section)  Acute Rehab PT Goals Patient Stated Goal: return [place] and [place] PT Goal Formulation: With patient/family Time For Goal Achievement: 06/19/23 Potential to Achieve Goals: Good    Frequency Min 1X/week     Co-evaluation               AM-PAC PT "6 Clicks" Mobility  Outcome Measure Help needed turning from your back to your side while in a flat bed without using bedrails?: A Little Help needed moving from lying on your back to sitting on the side of a flat bed without using bedrails?: A Lot Help needed moving to and from a bed to a chair (including a wheelchair)?: A Little Help needed standing up from a chair using your arms (e.g., wheelchair or bedside chair)?: A Little Help needed to walk in  hospital room?: A Little Help needed climbing 3-5 steps with a railing? : A Lot 6 Click Score: 16    End of Session   Activity Tolerance: Patient tolerated treatment well Patient left: in chair;with call bell/phone within reach;with family/visitor present Nurse Communication: Mobility status PT Visit Diagnosis: Other abnormalities of gait and mobility (R26.89);Difficulty in walking, not elsewhere classified (R26.2)    Time: 4098-1191 PT Time Calculation (min) (ACUTE ONLY): 39 min   Charges:   PT Evaluation $PT Eval Moderate Complexity: 1 Mod PT Treatments $Gait Training: 8-22 mins $Therapeutic Activity: 8-22 mins PT General Charges $$ ACUTE PT VISIT: 1 Visit         Merryl Hacker, PT Acute Rehabilitation Services Office: [phone]   Enedina Finner Taeveon Keesling 06/05/2023, 2:01 PM

## 2023-06-05 NOTE — Progress Notes (Addendum)
 NAME:  [name] [name], MRN:  [id number], DOB:  1945/05/31, LOS: 9 ADMISSION DATE:  05/27/2023, CONSULTATION DATE:  2/18 REFERRING MD:  Leafy Ro, CHIEF COMPLAINT:  post op critical care support    History of Present Illness:  78 year old male w/ medical hx as outlined below. Followed by both cards and CVTS for mod/svr MR w/ progressive fatigue. Presented to [facility] 2/18 for elective MR/TR and MAZE Intra-op echo 45-50% EF, LV global hypokinesis, mildly elevated PAS,  RV mildly enlarged.  Bypass started 0852 ended 1118  cell saver  To CVICU post op  PCCM asked to assist w/ post op care  Pertinent  Medical History  Severe MR, mod TR, HFmrEF (EF 55%), Dysrhythmia, HL, PAF s/p ablation 2012 (on xarelto), Prior CVA,chronic UTIs, prostate cancer s/p prostatectomy  Significant Hospital Events: Including procedures, antibiotic start and stop dates in addition to other pertinent events    2/18 MAZE, MV repair, TV repair, extubated overnight 2/25 cardioversion, back in bradycardic rhythm requiring pacing. PPM placed.  Interim History / Subjective:  Feeling better than yesterday. Afebrile  Objective   Blood pressure 108/73, pulse 100, temperature 98 F (36.7 C), temperature source Oral, resp. rate 18, height 5\' 10"  (1.778 m), weight 88.1 kg, SpO2 96%.        Intake/Output Summary (Last 24 hours) at 06/05/2023 0727 Last data filed at 06/05/2023 0700 Gross per 24 hour  Intake 696.78 ml  Output 800 ml  Net -103.22 ml   Filed Weights   06/03/23 0705 06/04/23 0500 06/05/23 0500  Weight: 90.5 kg 88.5 kg 88.1 kg    Examination: General: elderly man sitting in the recliner HENT: [place]/AT, eyes anicteric lungs: breathing comfortably on RA, decreased basilar breath sounds Cardiovascular: S1S2, RRR.  abdomen: soft, NT Extremities: minimal residual LE edema Neuro: awake, alert, moving all extremities  Derm: warm, dry, no rashes. Pacemaker compressive dressing still intact.  Na+ 134 K+ 3.5 BUN  25 Cr 0.92 WBC 14 H/H 10.3/30.6 Platelets 297  I/O -100cc  -9L for admission  Resolved Hospital Problem list   Consumptive thrombocytopenia- expected post-op  Assessment & Plan:  H/o Severe mitral regurgitation, tricuspid regurgitation, Afib, s/p MV and TV repair w/ MAZE on 2/18 Acute HFrEF post-op Persistent Afib, sinus node dysfunction with tachy-brady syndrome post-MAZE. Now sinus noed dysfunction--frequently in Afib with RVR and severe bradycardia requiring pacing, s/p single chamber PPM. -post-op care per TCTS & EP. Mobility restrictions LUE per EP.  -aspirin, statin -con't amiodarone -starting low dose apixaban today; titration per EP -lasix daily -con't spironolactone, will need to titrate up to GDMT as tolerated; would ideally be able to get on low-dose Entresto before discharge. BP still soft.  -progress mobility as able  Hyperglycemia; h/o pre-DM. A1c 5.8 Lots of variability in BG this morning -SSI PRN -goal bG 140-180  AKI, resolved Hypervolemic hyponatremia -con't daily diuresis -monitor K+ and renal function on spironolactone -strict I/O  Leukocytosis, likely reactive -checking UA, CXR (nothing concerning)  ABLA- expected post-op; stable -no need for transfusion currently  H/o HTN & HFrEF  -con't holding PTA toprol and losartan  Chronic pain -pain control per protocol; con't PTA tramadol  Constipation -miralax daily -has lactulose PRN  Fatigue, situational depression; better today -PT, OT -progress mobility as able -day night orientation -started zoloft this admission at his request  Transferring to the floor today. PCCM will be available as needed. Please call with questions.   Best Practice (right click and "Reselect all SmartList Selections"  daily)   Diet/type: Regular consistency (see orders) DVT prophylaxis DOAC Pressure ulcer(s): N/A GI prophylaxis: PPI Lines: N/A Foley:  N/A Code Status:  full code Last date of multidisciplinary  goals of care discussion [per primary ]  Labs   CBC: Recent Labs  Lab 06/01/23 0952 06/01/23 2230 06/02/23 0345 06/03/23 1031 06/04/23 0512 06/05/23 0345  WBC 9.9  --  11.2* 12.5* 11.9* 14.0*  HGB 11.0* 11.6* 10.7* 10.9* 10.4* 10.3*  HCT 32.8* 34.0* 32.1* 33.0* 30.8* 30.6*  MCV 90.4  --  90.2 92.2 90.3 91.1  PLT 182  --  209 272 278 297    Basic Metabolic Panel: Recent Labs  Lab 06/01/23 2342 06/02/23 0345 06/02/23 1524 06/03/23 0221 06/04/23 0512 06/05/23 0345  NA  --  134* 135 134* 130* 134*  K 3.4* 3.3* 3.5 3.4* 3.5 3.5  CL  --  94* 95* 99 105 101  CO2  --  29 27 26  19* 20*  GLUCOSE  --  102* 182* 101* 117* 112*  BUN  --  30* 30* 28* 29* 25*  CREATININE  --  1.02 1.15 1.00 0.98 0.92  CALCIUM  --  8.1* 8.3* 8.3* 8.0* 8.6*  MG 2.1 2.0  --  2.0 2.1 1.9   GFR: Estimated Creatinine Clearance: 71.6 mL/min (by C-G formula based on SCr of 0.92 mg/dL). Recent Labs  Lab 06/02/23 0345 06/03/23 1031 06/04/23 0512 06/05/23 0345  WBC 11.2* 12.5* 11.9* 14.0*        Critical care time:       Steffanie Dunn, DO 06/05/23 8:53 AM [facility]  For contact information, see Amion. If no response to pager, please call PCCM consult pager. After hours, 7PM- 7AM, please call Elink.

## 2023-06-05 NOTE — TOC Progression Note (Signed)
 Transition of Care [facility]) - Progression Note    Patient Details  Name: [name] [name] MRN: [id number] Date of Birth: 1945/04/22  Transition of [facility]) CM/SW Contact  Graves-Bigelow, Lamar Laundry, RN Phone Number: 06/05/2023, 11:54 AM  Clinical Narrative:  Patient discussed in progression rounds-POD-8 MVR/POD-2 pacemaker implant. Case Manager spoke with patient and daughter at the bedside. Daughter has concerns with patient returning home since patients spouse is post total knee replacement and she is walking with walker. Daughter works during the day in [place] and cannot provide care. Patient and family are asking for SNF. Case Manager requested Staff RN to order PT/OT. Case Manager will continue to follow for additional needs as the patient progresses.   Expected Discharge Plan: Skilled Nursing Facility Barriers to Discharge: Continued Medical Work up  Expected Discharge Plan and Services In-house Referral: Clinical Social Work Discharge Planning Services: CM Consult Post Acute Care Choice: Home Health Living arrangements for the past 2 months: Single Family Home    Social Determinants of Health (SDOH) Interventions SDOH Screenings   Food Insecurity: No Food Insecurity (05/28/2023)  Housing: Low Risk  (05/31/2023)  Transportation Needs: No Transportation Needs (05/28/2023)  Utilities: Not At Risk (05/28/2023)  Alcohol Screen: Low Risk  (10/31/2022)  Depression (PHQ2-9): Low Risk  (04/07/2023)  Financial Resource Strain: Low Risk  (10/31/2022)  Physical Activity: Insufficiently Active (10/31/2022)  Social Connections: Socially Integrated (05/28/2023)  Stress: No Stress Concern Present (10/31/2022)  Tobacco Use: Low Risk  (06/03/2023)  Health Literacy: Adequate Health Literacy (10/31/2022)    Readmission Risk Interventions     No data to display

## 2023-06-06 ENCOUNTER — Inpatient Hospital Stay (HOSPITAL_COMMUNITY): Payer: Medicare Other

## 2023-06-06 DIAGNOSIS — D62 Acute posthemorrhagic anemia: Secondary | ICD-10-CM | POA: Diagnosis not present

## 2023-06-06 DIAGNOSIS — I340 Nonrheumatic mitral (valve) insufficiency: Secondary | ICD-10-CM | POA: Diagnosis not present

## 2023-06-06 DIAGNOSIS — I5021 Acute systolic (congestive) heart failure: Secondary | ICD-10-CM | POA: Diagnosis not present

## 2023-06-06 DIAGNOSIS — I4891 Unspecified atrial fibrillation: Secondary | ICD-10-CM | POA: Diagnosis not present

## 2023-06-06 DIAGNOSIS — R7303 Prediabetes: Secondary | ICD-10-CM

## 2023-06-06 LAB — BASIC METABOLIC PANEL
Anion gap: 12 (ref 5–15)
BUN: 27 mg/dL — ABNORMAL HIGH (ref 8–23)
CO2: 18 mmol/L — ABNORMAL LOW (ref 22–32)
Calcium: 8.4 mg/dL — ABNORMAL LOW (ref 8.9–10.3)
Chloride: 104 mmol/L (ref 98–111)
Creatinine, Ser: 0.91 mg/dL (ref 0.61–1.24)
GFR, Estimated: >60 mL/min (ref >60)
Glucose, Bld: 101 mg/dL — ABNORMAL HIGH (ref 70–99)
Potassium: 3.9 mmol/L (ref 3.5–5.1)
Sodium: 134 mmol/L — ABNORMAL LOW (ref 135–145)

## 2023-06-06 LAB — CBC
HCT: 31.9 % — ABNORMAL LOW (ref 39.0–52.0)
Hemoglobin: 10.8 g/dL — ABNORMAL LOW (ref 13.0–17.0)
MCH: 30.5 pg (ref 26.0–34.0)
MCHC: 33.9 g/dL (ref 30.0–36.0)
MCV: 90.1 fL (ref 80.0–100.0)
Platelets: 378 10*3/uL (ref 150–400)
RBC: 3.54 MIL/uL — ABNORMAL LOW (ref 4.22–5.81)
RDW: 13.4 % (ref 11.5–15.5)
WBC: 15.9 10*3/uL — ABNORMAL HIGH (ref 4.0–10.5)
nRBC: 0 % (ref 0.0–0.2)

## 2023-06-06 LAB — GLUCOSE, CAPILLARY
Glucose-Capillary: 135 mg/dL — ABNORMAL HIGH (ref 70–99)
Glucose-Capillary: 94 mg/dL (ref 70–99)
Glucose-Capillary: 127 mg/dL — ABNORMAL HIGH (ref 70–99)

## 2023-06-06 LAB — MAGNESIUM: Magnesium: 2 mg/dL (ref 1.7–2.4)

## 2023-06-06 MED ORDER — POTASSIUM CHLORIDE CRYS ER 20 MEQ PO TBCR
20.0000 meq | EXTENDED_RELEASE_TABLET | Freq: Every day | ORAL | Status: DC
  Administered 2023-06-06 – 2023-06-09 (×4): 20 meq via ORAL
  Filled 2023-06-06 (×4): qty 1

## 2023-06-06 MED ORDER — AMIODARONE HCL 200 MG PO TABS
400.0000 mg | ORAL_TABLET | Freq: Two times a day (BID) | ORAL | Status: DC
  Administered 2023-06-07 – 2023-06-09 (×5): 400 mg via ORAL
  Filled 2023-06-06 (×5): qty 2

## 2023-06-06 MED ORDER — LACTULOSE 10 GM/15ML PO SOLN
30.0000 g | Freq: Every day | ORAL | Status: DC | PRN
  Administered 2023-06-06: 30 g via ORAL
  Filled 2023-06-06: qty 45

## 2023-06-06 MED ORDER — OXYCODONE HCL 5 MG PO TABS: 5.0000 mg | ORAL_TABLET | Freq: Two times a day (BID) | ORAL | Status: DC | PRN

## 2023-06-06 MED ORDER — METOPROLOL SUCCINATE ER 25 MG PO TB24
25.0000 mg | ORAL_TABLET | Freq: Two times a day (BID) | ORAL | Status: DC
Start: 2023-06-06 — End: 2023-06-07
  Administered 2023-06-06 (×2): 25 mg via ORAL
  Filled 2023-06-06 (×2): qty 1

## 2023-06-06 MED ORDER — SALINE SPRAY 0.65 % NA SOLN
1.0000 | NASAL | Status: DC | PRN
  Administered 2023-06-06: 1 via NASAL
  Filled 2023-06-06: qty 44

## 2023-06-06 NOTE — Progress Notes (Addendum)
 TCTS DAILY ICU PROGRESS NOTE                   [address]            [place] [address]          [phone]   3 Days Post-Op Procedure(s) (LRB): PACEMAKER IMPLANT (N/A)  Total Length of Stay:  LOS: 10 days   Subjective: Just feels poorly, feels very weak  Objective: Vital signs in last 24 hours: Temp:  [97.6 F (36.4 C)-98.7 F (37.1 C)] 97.6 F (36.4 C) (02/28 0430) Pulse Rate:  [87-130] 117 (02/28 0600) Cardiac Rhythm: Atrial fibrillation (02/27 2000) Resp:  [18-27] 19 (02/28 0600) BP: (98-148)/(69-112) 138/94 (02/28 0600) SpO2:  [91 %-97 %] 96 % (02/28 0600) Weight:  [87.8 kg] 87.8 kg (02/28 0530)  Filed Weights   06/04/23 0500 06/05/23 0500 06/06/23 0530  Weight: 88.5 kg 88.1 kg 87.8 kg    Weight change: -0.3 kg   Hemodynamic parameters for last 24 hours:    Intake/Output from previous day: 02/27 0701 - 02/28 0700 In: 23.8 [I.V.:23.8] Out: 1150 [Urine:1150]  Intake/Output this shift: No intake/output data recorded.  Current Meds: Scheduled Meds:  amiodarone  400 mg Oral TID   apixaban  2.5 mg Oral BID   Followed by   Melene Muller ON 06/07/2023] apixaban  5 mg Oral BID   aspirin  81 mg Oral Daily   atorvastatin  20 mg Oral Daily   bisacodyl  10 mg Oral Daily   Or   bisacodyl  10 mg Rectal Daily   chlorhexidine  60 mL Topical Once   chlorhexidine  60 mL Topical Once   Chlorhexidine Gluconate Cloth  6 each Topical Daily   dapagliflozin propanediol  10 mg Oral Daily   feeding supplement  237 mL Oral BID BM   furosemide  40 mg Oral Daily   insulin aspart  0-24 Units Subcutaneous TID WC   insulin aspart  0-5 Units Subcutaneous QHS   magnesium gluconate  500 mg Oral Daily   metoprolol succinate  25 mg Oral BID   pantoprazole  40 mg Oral Daily   polyethylene glycol  17 g Oral Daily   senna-docusate  1 tablet Oral Daily   sertraline  25 mg Oral Daily   sodium chloride flush  3 mL Intravenous Q12H   sodium chloride flush  3-10 mL  Intravenous Q12H   sodium chloride flush  3-10 mL Intravenous Q12H   spironolactone  12.5 mg Oral Daily   Continuous Infusions:  albumin human Stopped (05/28/23 0700)   PRN Meds:.acetaminophen, albumin human, lactulose, metoprolol tartrate, morphine injection, ondansetron (ZOFRAN) IV, mouth rinse, oxyCODONE, sodium chloride, sodium chloride flush, sodium chloride flush, sodium chloride flush, traMADol  General appearance: alert, cooperative, and no distress Heart: irregularly irregular rhythm Lungs: clear to auscultation bilaterally Abdomen: increased distension, high pitched BS, non tender Extremities: no edema Wound: incis covered with large dressing from pacer  Lab Results: CBC: Recent Labs    06/05/23 0345 06/06/23 0329  WBC 14.0* 15.9*  HGB 10.3* 10.8*  HCT 30.6* 31.9*  PLT 297 378   BMET:  Recent Labs    06/05/23 0345 06/06/23 0329  NA 134* 134*  K 3.5 3.9  CL 101 104  CO2 20* 18*  GLUCOSE 112* 101*  BUN 25* 27*  CREATININE 0.92 0.91  CALCIUM 8.6* 8.4*    CMET: Lab Results  Component Value Date   WBC 15.9 (H) 06/06/2023  HGB 10.8 (L) 06/06/2023   HCT 31.9 (L) 06/06/2023   PLT 378 06/06/2023   GLUCOSE 101 (H) 06/06/2023   CHOL 111 12/06/2022   TRIG 98 12/06/2022   HDL 28 (L) 12/06/2022   LDLCALC 65 12/06/2022   ALT 20 05/23/2023   AST 23 05/23/2023   NA 134 (L) 06/06/2023   K 3.9 06/06/2023   CL 104 06/06/2023   CREATININE 0.91 06/06/2023   BUN 27 (H) 06/06/2023   CO2 18 (L) 06/06/2023   TSH 1.83 04/07/2023   PSA <0.04 08/13/2021   INR 1.7 (H) 05/27/2023   HGBA1C 5.8 (H) 05/23/2023      PT/INR: No results for input(s): "LABPROT", "INR" in the last 72 hours. Radiology: DG CHEST PORT 1 VIEW Result Date: 06/05/2023 CLINICAL DATA:  [id number] Respiratory failure (HCC) [phone] EXAM: PORTABLE CHEST 1 VIEW COMPARISON:  06/04/2023 FINDINGS: Left-sided implanted cardiac device remains in place. Stable cardiomegaly. Prior postsurgical changes to the chest.  No focal airspace consolidation, pleural effusion, or pneumothorax. IMPRESSION: No active disease. Electronically Signed   By: Duanne Guess D.O.   On: 06/05/2023 12:16     Assessment/Plan: S/P Procedure(s) (LRB): PACEMAKER IMPLANT (N/A)  1 afeb, afib, HR control improving but still tachy at times, upto 140's, PPM - per EP, on amio and  Lopressor, poss change to coreg? W/ impaired function. Now on eliquis 2 sats good on RA 3 good UOP, weight stable, on lasix 40 q day, spiro 4 + BM 5 creat remains normal , BUN up slightly to 27, a bit acidotic- no longer on diamox 6 leukocytosis trending higher, WBC 15.9, will need to monitor closely for signs of infection,  CXR yesterday looked like NAD, UA - only rare bact, does not appear infected, some Hgb/RBC's 7 OT and PT consults in place to see if qualifies for ST SNF as will not have significant family assistance at home 8 will get abdominal xrays with increasing distension and high pitched BS 9 will keep in unit for now 10 pulm hygiene and rehab modalities as able   Rowe Clack PA-C Pager [phone] 06/06/2023 7:36 AM  Agree with above

## 2023-06-06 NOTE — Evaluation (Signed)
 Occupational Therapy Evaluation Patient Details Name: [name] [name] MRN: [id number] DOB: April 14, 1945 Today's Date: 06/06/2023   History of Present Illness   78 y/o male admitted 2/18 for mitral and tricuspid valve repair, MAZE same date. 2/25 DCCV, PPM placed.  PMhx: Mitral regurgitation, paroxysmal Afib, CVA, HTN, HLD, Prostate CA, glaucoma     Clinical Impressions Pt was independent prior to admission and able to assist his wife. Presents with limitations in ability to perform ADLs due to sternal and pacemaker precautions, generalized weakness, decreased activity tolerance and impaired standing balance. Pt requires set up to total assist for ADLs. Min assist with RW to stand from elevated bed and moderate assistance for bed mobility. Reinforced pacemaker and sternal precautions and began educating in compensatory strategies for ADLs. Patient will benefit from continued inpatient follow up therapy, <3 hours/day.     If plan is discharge home, recommend the following:   A little help with walking and/or transfers;A lot of help with bathing/dressing/bathroom;Assistance with cooking/housework;Assist for transportation;Help with stairs or ramp for entrance     Functional Status Assessment   Patient has had a recent decline in their functional status and demonstrates the ability to make significant improvements in function in a reasonable and predictable amount of time.     Equipment Recommendations   Other (comment) (defer to next venue)     Recommendations for Other Services         Precautions/Restrictions   Precautions Precautions: Sternal;Fall;ICD/Pacemaker (urinary incontinence) Precaution Booklet Issued: Yes (comment) Recall of Precautions/Restrictions: Impaired Precaution/Restrictions Comments: provided PPM precaution progression handout Restrictions Weight Bearing Restrictions Per Provider Order: No     Mobility Bed Mobility Overal bed mobility: Needs  Assistance Bed Mobility: Supine to Sit, Sit to Supine     Supine to sit: HOB elevated, Mod assist Sit to supine: Mod assist   General bed mobility comments: cues for technique, assist to raise trunk and for LEs back into bed    Transfers Overall transfer level: Needs assistance Equipment used: Rolling walker (2 wheels) Transfers: Sit to/from Stand Sit to Stand: Min assist           General transfer comment: from bed      Balance Overall balance assessment: Needs assistance   Sitting balance-Leahy Scale: Fair     Standing balance support: Bilateral upper extremity supported Standing balance-Leahy Scale: Poor                             ADL either performed or assessed with clinical judgement   ADL Overall ADL's : Needs assistance/impaired Eating/Feeding: Independent   Grooming: Oral care;Sitting   Upper Body Bathing: Moderate assistance;Sitting   Lower Body Bathing: Total assistance;Sit to/from stand   Upper Body Dressing : Moderate assistance;Sitting   Lower Body Dressing: Total assistance;Sit to/from stand   Toilet Transfer: Minimal assistance;Rolling walker (2 wheels)   Toileting- Clothing Manipulation and Hygiene: Total assistance;Sit to/from stand               Vision Ability to See in Adequate Light: 0 Adequate Patient Visual Report: No change from baseline       Perception         Praxis         Pertinent Vitals/Pain Pain Assessment Pain Assessment: No/denies pain     Extremity/Trunk Assessment Upper Extremity Assessment Upper Extremity Assessment: Right hand dominant;LUE deficits/detail LUE Deficits / Details: pacemaker precautions, full AROM elbow to hand LUE Coordination:  decreased gross motor   Lower Extremity Assessment Lower Extremity Assessment: Defer to PT evaluation   Cervical / Trunk Assessment Cervical / Trunk Assessment: Other exceptions (weakness)   Communication Communication Communication: No  apparent difficulties   Cognition Arousal: Alert Behavior During Therapy: Flat affect Cognition: No apparent impairments                                       Cueing  General Comments          Exercises     Shoulder Instructions      Home Living Family/patient expects to be discharged to:: Private residence Living Arrangements: Spouse/significant other Available Help at Discharge: Family;Available PRN/intermittently Type of Home: House Home Access: Stairs to enter Entrance Stairs-Number of Steps: 2   Home Layout: Two level;Able to live on main level with bedroom/bathroom     Bathroom Shower/Tub: Producer, television/film/video: Handicapped height         Additional Comments: has upright walker      Prior Functioning/Environment Prior Level of Function : Independent/Modified Independent;Driving             Mobility Comments: was walking independently and assisting wife after TKA ADLs Comments: struggled with LB bathing and dressing    OT Problem List: Decreased strength;Decreased activity tolerance;Impaired balance (sitting and/or standing);Decreased coordination;Decreased knowledge of use of DME or AE;Decreased knowledge of precautions;Cardiopulmonary status limiting activity;Impaired UE functional use   OT Treatment/Interventions: Self-care/ADL training;DME and/or AE instruction;Therapeutic activities;Patient/family education;Balance training;Energy conservation      OT Goals(Current goals can be found in the care plan section)   Acute Rehab OT Goals OT Goal Formulation: With patient Time For Goal Achievement: 06/20/23 Potential to Achieve Goals: Good   OT Frequency:  Min 1X/week    Co-evaluation              AM-PAC OT "6 Clicks" Daily Activity     Outcome Measure Help from another person eating meals?: None Help from another person taking care of personal grooming?: A Little Help from another person toileting, which  includes using toliet, bedpan, or urinal?: Total Help from another person bathing (including washing, rinsing, drying)?: A Lot Help from another person to put on and taking off regular upper body clothing?: A Lot Help from another person to put on and taking off regular lower body clothing?: Total 6 Click Score: 13   End of Session Equipment Utilized During Treatment: Gait belt;Rolling walker (2 wheels)  Activity Tolerance: Patient tolerated treatment well Patient left: in bed;with call bell/phone within reach;with family/visitor present;with bed alarm set  OT Visit Diagnosis: Unsteadiness on feet (R26.81);Other abnormalities of gait and mobility (R26.89);Muscle weakness (generalized) (M62.81)                Time: 1610-9604 OT Time Calculation (min): 33 min Charges:  OT General Charges $OT Visit: 1 Visit OT Evaluation $OT Eval Moderate Complexity: 1 Mod OT Treatments $Self Care/Home Management : 8-22 mins  Berna Spare, OTR/L Acute Rehabilitation Services Office: [phone]   Evern Bio 06/06/2023, 4:28 PM

## 2023-06-06 NOTE — TOC Initial Note (Signed)
 Transition of Care [facility]) - Initial/Assessment Note    Patient Details  Name: [name] [name] MRN: [id number] Date of Birth: [birth year]  [facility] of [facility] CM/SW Contact:    Delilah Shan, LCSWA Phone Number: 06/06/2023, 11:14 AM  Clinical Narrative:                  CSW received consult for possible SNF placement at time of discharge. CSW spoke with patient at bedside regarding PT recommendation of SNF placement at time of discharge. Patient reports he comes from home with spouse.Patient expressed understanding of PT recommendation and is agreeable to SNF placement at time of discharge. Patient gave CSW permission to fax out initial referral for SNF placement. CSW discussed insurance authorization process with patient. No further questions reported at this time. CSW to continue to follow and assist with discharge planning needs.   Expected Discharge Plan: Skilled Nursing Facility Barriers to Discharge: Continued Medical Work up   Patient Goals and CMS Choice Patient states their goals for this hospitalization and ongoing recovery are:: SNF   Choice offered to / list presented to : Patient      Expected Discharge Plan and Services In-house Referral: Clinical Social Work Discharge Planning Services: CM Consult Post Acute Care Choice: Home Health Living arrangements for the past 2 months: Single Family Home                                      Prior Living Arrangements/Services Living arrangements for the past 2 months: Single Family Home Lives with:: Spouse Patient language and need for interpreter reviewed:: Yes Do you feel safe going back to the place where you live?: No   SNF  Need for Family Participation in Patient Care: Yes (Comment) Care giver support system in place?: Yes (comment)   Criminal Activity/Legal Involvement Pertinent to Current Situation/Hospitalization: No - Comment as needed  Activities of Daily Living   ADL Screening (condition at time  of admission) Independently performs ADLs?: Yes (appropriate for developmental age) Is the patient deaf or have difficulty hearing?: No Does the patient have difficulty seeing, even when wearing glasses/contacts?: No Does the patient have difficulty concentrating, remembering, or making decisions?: No  Permission Sought/Granted Permission sought to share information with : Case Manager, Magazine features editor, Family Supports Permission granted to share information with : Yes, Verbal Permission Granted     Permission granted to share info w AGENCY: SNF        Emotional Assessment Appearance:: Appears stated age Attitude/Demeanor/Rapport: Gracious Affect (typically observed): Calm Orientation: : Oriented to Self, Oriented to Place, Oriented to  Time, Oriented to Situation Alcohol / Substance Use: Not Applicable Psych Involvement: No (comment)  Admission diagnosis:  S/P MVR (mitral valve repair) [Z98.890] Patient Active Problem List   Diagnosis Date Noted   Leukocytosis 06/05/2023   ABLA (acute blood loss anemia) 06/05/2023   Acute HFrEF (heart failure with reduced ejection fraction) (HCC) 06/03/2023   Hyponatremia 06/03/2023   Atrial fibrillation with RVR (HCC) 06/03/2023   S/P MVR (mitral valve repair) 05/27/2023   Nonrheumatic mitral valve regurgitation 02/07/2023   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 [place] [address] Phone: [phone] Fax: [phone]     Social Drivers of Health (SDOH) Social History: SDOH Screenings   Food Insecurity: No Food Insecurity  (05/28/2023)  Housing: Low Risk  (05/31/2023)  Transportation Needs: No Transportation Needs (05/28/2023)  Utilities: Not At Risk (05/28/2023)  Alcohol Screen: Low Risk  (10/31/2022)  Depression (PHQ2-9): Low Risk  (04/07/2023)  Financial Resource Strain: Low Risk  (10/31/2022)  Physical Activity: Insufficiently Active (10/31/2022)  Social Connections: Socially Integrated (05/28/2023)  Stress: No Stress Concern Present (10/31/2022)  Tobacco Use: Low Risk  (06/03/2023)  Health Literacy: Adequate Health Literacy (10/31/2022)   SDOH Interventions:     Readmission Risk Interventions     No data to display

## 2023-06-06 NOTE — Progress Notes (Addendum)
  Patient Name: [name] [name] Date of Encounter: 06/06/2023  Primary Cardiologist: [name], MD Electrophysiologist: Lewayne Bunting, MD  Interval Summary   Feeling OK this am. HRs remain variable. WBC up slightly, no fever.   Vital Signs    Vitals:   06/06/23 0430 06/06/23 0500 06/06/23 0530 06/06/23 0600  BP:  124/86  (!) 138/94  Pulse:  95  (!) 117  Resp:  (!) 22  19  Temp: 97.6 F (36.4 C)     TempSrc: Oral     SpO2:  96%  96%  Weight:   87.8 kg   Height:        Intake/Output Summary (Last 24 hours) at 06/06/2023 0729 Last data filed at 06/06/2023 0600 Gross per 24 hour  Intake 23.77 ml  Output 1150 ml  Net -1126.23 ml   Filed Weights   06/04/23 0500 06/05/23 0500 06/06/23 0530  Weight: 88.5 kg 88.1 kg 87.8 kg    Physical Exam    GEN- The patient is fatigued appearing, alert and oriented x 3 today.   Lungs- Clear to ausculation bilaterally, normal work of breathing Cardiac- Irregularly irregular rate and rhythm, no murmurs, rubs or gallops GI- soft, NT, ND, + BS Extremities- no clubbing or cyanosis. No edema  Telemetry    AF 90-110s (personally reviewed)  Hospital Course    [name] [name] is a 78 y.o. male admitted for [name] [name] is a 78 y.o. male admitted for planned MAZE/MV/TV repair, post operative course complicated by SND with bradycardia, and now AF with RVR   Assessment & Plan    SND Persistent AF with RVR S/p AAI PPM 2/25 by Dr. Ladona Ridgel.  Increase toprol to 25 mg BID.  Can change to lopressor temporarily if need to rapidly titrate for rate control, but would consolidate back to toprol on D/c.  Amiodarone changed to 400 mg TID per TCTS.  Pt remains in AF Continue Eliquis 2.5 mg BID today and tomorrow morning, then back to 5 mg BID.  Keep pressure dressing on today again if staying. Will review with weekend MD that needs to come off 3/1.   S/p MV and TV repair Otherwise stable, s/p AAI PPM 2/25. WBC 14 -> 15.9. UA pending.    Acute  systolic CHF EF 30-35% 2/21 (down from 55% 02/14/2023) Potentially in the setting of valvular disease and tachycardia.  Poor candidate for ICD lead at this time with new Tricuspid repair Continue toprol and spiro No room yet for ACE/ARB and also had AKI.   For questions or updates, please contact [facility] Please consult [email or web address] for contact info under Cardiology/STEMI.  Signed, Graciella Freer, PA-C  06/06/2023, 7:29 AM   I have seen, examined the patient, and reviewed the above assessment and plan.    Interval: No acute overnight events. Continues to be in atrial fibrillation, primarily rate controlled.  Feels about the same.  GEN: No acute distress.   Cardiac: Normal rate, irregular. Psych: Normal affect   Assessment:  #Postoperative atrial fibrillation: Has been paroxysmal since cardioversion.  -Oral amiodarone load.  -Uptitrate metoprolol as needed. -Continue Eliquis. Back to 5mg  BID on 3/1.  He is s/p LAAC. Keep pressure dressing in place.    #Sinus node dysfunction s/p AAI PPM on 2/25.  - Appropriate device function and stable lead parameters. Left chest pressure dressing in place.  Nobie Putnam, MD

## 2023-06-06 NOTE — Progress Notes (Addendum)
 Physical Therapy Treatment Patient Details Name: [name] [name] MRN: [id number] DOB: June 09, 1945 Today's Date: 06/06/2023   History of Present Illness 78 y/o male admitted 2/18 for mitral and tricuspid valve repair, MAZE same date. 2/25 DCCV, PPM placed.  PMhx: Mitral regurgitation, paroxysmal Afib, CVA, HTN, HLD, Prostate CA, glaucoma    PT Comments  Pt in recliner upon arrival and agreeable to PT session. Worked on gait training and LE strength in today's session. Pt has difficulty remembering sternal and PPM precautions with verbal cues for adherence. Pt required ModA to stand from EOB and MinA to stand from toilet level surface. Pt was able to increase gait distance today, however, required rest breaks due to fatigue. Of note, pt also required TotalA for pericare in standing. Pt does not have physical assist available at home and would benefit from <3hrs post acute rehab to work towards independence with mobility. Pt is progressing well towards goals. Acute PT to follow.    BP 116/90,99; HR 93 at rest HR 108-128 BPM during gait SpO2 93% on RA   If plan is discharge home, recommend the following: A little help with walking and/or transfers;A little help with bathing/dressing/bathroom;Assistance with cooking/housework;Assist for transportation;Help with stairs or ramp for entrance   Can travel by private vehicle     Yes  Equipment Recommendations  Rolling walker (2 wheels);BSC/3in1       Precautions / Restrictions Precautions Precautions: Sternal;Fall;ICD/Pacemaker (urinary incontinence) Precaution Booklet Issued: Yes (comment) Recall of Precautions/Restrictions: Impaired Precaution/Restrictions Comments: Able to recall PPM precautions and 1/3 sternal precautions. Reminded of precautions Restrictions Weight Bearing Restrictions Per Provider Order: Yes RUE Weight Bearing Per Provider Order: Non weight bearing LUE Weight Bearing Per Provider Order: Non weight bearing     Mobility   Bed Mobility Overal bed mobility: Needs Assistance Bed Mobility: Sit to Supine    Sit to supine: Mod assist   General bed mobility comments: ModA for return to supine for LE management. Cues for log roll technique    Transfers Overall transfer level: Needs assistance Equipment used: Rolling walker (2 wheels) Transfers: Sit to/from Stand Sit to Stand: Mod assist    General transfer comment: ModA for boost up from EOB and MinA from toilet. Pt able to stand with hand placement on B LEs.    Ambulation/Gait Ambulation/Gait assistance: Contact guard assist Gait Distance (Feet): 80 Feet (x3) Assistive device: Rolling walker (2 wheels) Gait Pattern/deviations: Step-through pattern, Decreased stride length Gait velocity: decr     General Gait Details: Slow and steady gait with decreased stride length. x3 standing rest breaks due to fatigue. SpO2 >90% on RA when pleth reading accurately    Balance Overall balance assessment: Needs assistance Sitting-balance support: No upper extremity supported, Feet supported Sitting balance-Leahy Scale: Fair     Standing balance support: Bilateral upper extremity supported Standing balance-Leahy Scale: Poor Standing balance comment: reliant on RW         Communication Communication Communication: No apparent difficulties  Cognition Arousal: Alert Behavior During Therapy: Flat affect   PT - Cognitive impairments: Memory    PT - Cognition Comments: decreased recall of precautions Following commands: Intact      Cueing Cueing Techniques: Verbal cues, Gestural cues         Pertinent Vitals/Pain Pain Assessment Pain Assessment: No/denies pain     PT Goals (current goals can now be found in the care plan section) Acute Rehab PT Goals PT Goal Formulation: With patient/family Time For Goal Achievement: 06/19/23 Potential to  Achieve Goals: Good Progress towards PT goals: Progressing toward goals    Frequency    Min  1X/week       AM-PAC PT "6 Clicks" Mobility   Outcome Measure  Help needed turning from your back to your side while in a flat bed without using bedrails?: A Little Help needed moving from lying on your back to sitting on the side of a flat bed without using bedrails?: A Lot Help needed moving to and from a bed to a chair (including a wheelchair)?: A Lot Help needed standing up from a chair using your arms (e.g., wheelchair or bedside chair)?: A Lot Help needed to walk in hospital room?: A Little Help needed climbing 3-5 steps with a railing? : A Lot 6 Click Score: 14    End of Session   Activity Tolerance: Patient tolerated treatment well Patient left: in bed;with call bell/phone within reach Nurse Communication: Mobility status PT Visit Diagnosis: Other abnormalities of gait and mobility (R26.89);Difficulty in walking, not elsewhere classified (R26.2)     Time: 5621-3086 PT Time Calculation (min) (ACUTE ONLY): 32 min  Charges:    $Gait Training: 8-22 mins $Therapeutic Activity: 8-22 mins PT General Charges $$ ACUTE PT VISIT: 1 Visit                     Hilton Cork, PT, DPT Secure Chat Preferred  Rehab Office [phone]   Arturo Morton Brion Aliment 06/06/2023, 9:27 AM

## 2023-06-06 NOTE — Progress Notes (Signed)
 NAME:  [name] [name], MRN:  [id number], DOB:  12/14/1945, LOS: 10 ADMISSION DATE:  05/27/2023, CONSULTATION DATE:  2/18 REFERRING MD:  Leafy Ro, CHIEF COMPLAINT:  post op critical care support    History of Present Illness:  78 year old male w/ medical hx as outlined below. Followed by both cards and CVTS for mod/svr MR w/ progressive fatigue. Presented to [facility] 2/18 for elective MR/TR and MAZE Intra-op echo 45-50% EF, LV global hypokinesis, mildly elevated PAS,  RV mildly enlarged.  Bypass started 0852 ended 1118  cell saver  To CVICU post op  PCCM asked to assist w/ post op care  Pertinent  Medical History  Severe MR, mod TR, HFmrEF (EF 55%), Dysrhythmia, HL, PAF s/p ablation 2012 (on xarelto), Prior CVA,chronic UTIs, prostate cancer s/p prostatectomy  Significant Hospital Events: Including procedures, antibiotic start and stop dates in addition to other pertinent events    2/18 MAZE, MV repair, TV repair, extubated overnight 2/25 cardioversion, back in bradycardic rhythm requiring pacing. PPM placed.  Interim History / Subjective:  Stated feeling tired and weak Denies complaint of pain  Objective   Blood pressure (!) 138/94, pulse (!) 117, temperature 97.6 F (36.4 C), temperature source Oral, resp. rate 19, height 5\' 10"  (1.778 m), weight 87.8 kg, SpO2 96%.        Intake/Output Summary (Last 24 hours) at 06/06/2023 0932 Last data filed at 06/06/2023 0600 Gross per 24 hour  Intake --  Output 1150 ml  Net -1150 ml   Filed Weights   06/04/23 0500 06/05/23 0500 06/06/23 0530  Weight: 88.5 kg 88.1 kg 87.8 kg    Examination: General: Elderly male, lying on the bed HEENT: [place]/AT, eyes anicteric.  moist mucus membranes Neuro: Alert, awake following commands Chest: Coarse breath sounds, no wheezes or rhonchi Heart: Irregularly irregular, tachycardic, no murmurs or gallops Abdomen: Soft, nontender, nondistended, bowel sounds present Skin: No rash   Na+ 134 K+ 3.9 BUN  27 Cr 0.91 WBC 15.9 H/H 10.8/32 Platelets 378   Resolved Hospital Problem list   Consumptive thrombocytopenia- expected post-op Acute kidney injury  Assessment & Plan:  Severe mitral regurgitation and tricuspid regurgitation status post MVR and TV repair Persistent atrial fibrillation status post MAZE on 2/18 Acute HFrEF Sinus node dysfunction with tachy-brady syndrome s/p single chamber PPM. Continue aspirin and statin Heart rate is not well-controlled Amiodarone was increased to 400 3 times daily Toprol was increased to 25 mg twice daily Continue apixaban for stroke prophylaxis Continue diuretics Continue spironolactone Patient will be on Entresto before discharge  Prediabetes with hyperglycemia Continue insulin with CBG goal 140-180  Hypervolemic hyponatremia Continue diuresis Monitor intake and output  Leukocytosis, likely reactive Remain afebrile, closely monitor White count went up to 15.9  ABLA- expected post-op; stable Monitor H&H   Best Practice (right click and "Reselect all SmartList Selections" daily)   Diet/type: Regular consistency (see orders) DVT prophylaxis DOAC Pressure ulcer(s): N/A GI prophylaxis: PPI Lines: N/A Foley:  N/A Code Status:  full code Last date of multidisciplinary goals of care discussion [per primary ]  Labs   CBC: Recent Labs  Lab 06/02/23 0345 06/03/23 1031 06/04/23 0512 06/05/23 0345 06/06/23 0329  WBC 11.2* 12.5* 11.9* 14.0* 15.9*  HGB 10.7* 10.9* 10.4* 10.3* 10.8*  HCT 32.1* 33.0* 30.8* 30.6* 31.9*  MCV 90.2 92.2 90.3 91.1 90.1  PLT 209 272 278 297 378    Basic Metabolic Panel: Recent Labs  Lab 06/02/23 0345 06/02/23 1524 06/03/23 0221 06/04/23  5784 06/05/23 0345 06/06/23 0329  NA 134* 135 134* 130* 134* 134*  K 3.3* 3.5 3.4* 3.5 3.5 3.9  CL 94* 95* 99 105 101 104  CO2 29 27 26  19* 20* 18*  GLUCOSE 102* 182* 101* 117* 112* 101*  BUN 30* 30* 28* 29* 25* 27*  CREATININE 1.02 1.15 1.00 0.98 0.92  0.91  CALCIUM 8.1* 8.3* 8.3* 8.0* 8.6* 8.4*  MG 2.0  --  2.0 2.1 1.9 2.0   GFR: Estimated Creatinine Clearance: 72.3 mL/min (by C-G formula based on SCr of 0.91 mg/dL). Recent Labs  Lab 06/03/23 1031 06/04/23 0512 06/05/23 0345 06/06/23 0329  PROCALCITON  --   --  <0.10  --   WBC 12.5* 11.9* 14.0* 15.9*     Cheri Fowler, MD  Pulmonary Critical Care See Amion for pager If no response to pager, please call [phone] until 7pm After 7pm, Please call E-link [phone]

## 2023-06-06 NOTE — [place] FL2 (Signed)
  MEDICAID FL2 LEVEL OF CARE FORM     IDENTIFICATION  Patient Name: Michio Thier Koning Birthdate: [birth year] Sex: male Admission Date (Current Location): 05/27/2023  [facility] and [place] Number:  Producer, television/film/video and Address:  [facility], [address], [place], [place] [address]      Provider Number: [id number]  Attending Physician Name and Address:  Eugenio Hoes, MD  Relative Name and Phone Number:       Current Level of Care: Hospital Recommended Level of Care: Skilled Nursing Facility Prior Approval Number:    Date Approved/Denied:   PASRR Number: [id number]  Discharge Plan: SNF    Current Diagnoses: Patient Active Problem List   Diagnosis Date Noted   Leukocytosis 06/05/2023   ABLA (acute blood loss anemia) 06/05/2023   Acute HFrEF (heart failure with reduced ejection fraction) (HCC) 06/03/2023   Hyponatremia 06/03/2023   Atrial fibrillation with RVR (HCC) 06/03/2023   S/P MVR (mitral valve repair) 05/27/2023   Nonrheumatic mitral valve regurgitation 02/07/2023   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   Body mass index (BMI) 29.0-29.9, adult 12/15/2020   Sinus node dysfunction (HCC) 06/19/2020   Hypertension 06/19/2020   Glaucoma 04/17/2016   DYSLIPIDEMIA 08/03/2009   ATRIAL FIBRILLATION 08/03/2009   Transient cerebral ischemia 08/03/2009   ATRIAL FLUTTER 06/17/2008    Orientation RESPIRATION BLADDER Height & Weight     Self, Time, Situation, Place  Normal Continent, External catheter (External Urinary Catheter) Weight: 193 lb 9 oz (87.8 kg) Height:  5\' 10"  (177.8 cm)  BEHAVIORAL SYMPTOMS/MOOD NEUROLOGICAL BOWEL NUTRITION STATUS      Continent Diet (Please see discharge summary)  AMBULATORY STATUS COMMUNICATION OF NEEDS Skin   Limited Assist Verbally Other (Comment) (Erythema,Groin,Buttocks,Bil.,Wound/Incision LDAs,Incision Closed,Chest,Other)                       Personal Care Assistance Level of Assistance  Bathing,  Feeding, Dressing Bathing Assistance: Limited assistance Feeding assistance: Independent Dressing Assistance: Limited assistance     Functional Limitations Info  Sight, Hearing, Speech Sight Info: Adequate Hearing Info: Adequate Speech Info: Adequate    SPECIAL CARE FACTORS FREQUENCY  PT (By licensed PT), OT (By licensed OT)     PT Frequency: 5x min weekly OT Frequency: 5x min weekly            Contractures Contractures Info: Not present    Additional Factors Info  Code Status, Allergies, Insulin Sliding Scale, Psychotropic Code Status Info: FULL Allergies Info: NKA Psychotropic Info: sertraline (ZOLOFT) tablet 25 mg daily Insulin Sliding Scale Info: insulin aspart (novoLOG) injection 0-24 Units 3 times daily with meals,insulin aspart (novoLOG) injection 0-5 Units daily at bedtime       Current Medications (06/06/2023):  This is the current hospital active medication list Current Facility-Administered Medications  Medication Dose Route Frequency Provider Last Rate Last Admin   acetaminophen (TYLENOL) tablet 325-650 mg  325-650 mg Oral Q4H PRN Marinus Maw, MD       albumin human 5 % solution 12.5 g  250 mL Intravenous Q15 min PRN Marinus Maw, MD   Stopping previously hung infusion at 05/28/23 0700   amiodarone (PACERONE) tablet 400 mg  400 mg Oral TID Gershon Crane E, PA-C   400 mg at 06/05/23 2109   apixaban (ELIQUIS) tablet 2.5 mg  2.5 mg Oral BID Graciella Freer, PA-C   2.5 mg at 06/05/23 2109   Followed by   Melene Muller ON 06/07/2023] apixaban (ELIQUIS)  tablet 5 mg  5 mg Oral BID Graciella Freer, PA-C       aspirin chewable tablet 81 mg  81 mg Oral Daily Marinus Maw, MD   81 mg at 06/05/23 8119   atorvastatin (LIPITOR) tablet 20 mg  20 mg Oral Daily Marinus Maw, MD   20 mg at 06/05/23 1478   bisacodyl (DULCOLAX) EC tablet 10 mg  10 mg Oral Daily Marinus Maw, MD   10 mg at 06/05/23 2956   Or   bisacodyl (DULCOLAX) suppository 10 mg  10 mg  Rectal Daily Marinus Maw, MD       chlorhexidine (HIBICLENS) 4 % liquid 4 Application  60 mL Topical Once Graciella Freer, PA-C       chlorhexidine (HIBICLENS) 4 % liquid 4 Application  60 mL Topical Once Graciella Freer, PA-C       Chlorhexidine Gluconate Cloth 2 % PADS 6 each  6 each Topical Daily Marinus Maw, MD   6 each at 06/05/23 1045   dapagliflozin propanediol (FARXIGA) tablet 10 mg  10 mg Oral Daily Karie Fetch P, DO   10 mg at 06/05/23 1030   feeding supplement (ENSURE ENLIVE / ENSURE PLUS) liquid 237 mL  237 mL Oral BID BM Marinus Maw, MD   237 mL at 06/05/23 1452   furosemide (LASIX) tablet 40 mg  40 mg Oral Daily Marinus Maw, MD   40 mg at 06/05/23 0906   insulin aspart (novoLOG) injection 0-24 Units  0-24 Units Subcutaneous TID WC Marinus Maw, MD   2 Units at 06/06/23 2130   insulin aspart (novoLOG) injection 0-5 Units  0-5 Units Subcutaneous QHS Marinus Maw, MD       lactulose (CHRONULAC) 10 GM/15ML solution 20 g  20 g Oral Daily PRN Gold, Wayne E, PA-C       magnesium gluconate (MAGONATE) tablet 500 mg  500 mg Oral Daily Gold, Wayne E, PA-C   500 mg at 06/05/23 8657   metoprolol succinate (TOPROL-XL) 24 hr tablet 25 mg  25 mg Oral BID Graciella Freer, PA-C       metoprolol tartrate (LOPRESSOR) injection 2.5-5 mg  2.5-5 mg Intravenous Q2H PRN Marinus Maw, MD   2.5 mg at 06/02/23 8469   morphine (PF) 2 MG/ML injection 1-4 mg  1-4 mg Intravenous Q1H PRN Marinus Maw, MD   2 mg at 05/30/23 0750   ondansetron (ZOFRAN) injection 4 mg  4 mg Intravenous Q6H PRN Marinus Maw, MD       Oral care mouth rinse  15 mL Mouth Rinse PRN Marinus Maw, MD       oxyCODONE (Oxy IR/ROXICODONE) immediate release tablet 5-10 mg  5-10 mg Oral Q3H PRN Marinus Maw, MD   10 mg at 06/04/23 2100   pantoprazole (PROTONIX) EC tablet 40 mg  40 mg Oral Daily Marinus Maw, MD   40 mg at 06/05/23 0906   polyethylene glycol (MIRALAX / GLYCOLAX) packet  17 g  17 g Oral Daily Steffanie Dunn, DO   17 g at 06/05/23 6295   potassium chloride SA (KLOR-CON M) CR tablet 20 mEq  20 mEq Oral Daily Gold, Wayne E, PA-C       senna-docusate (Senokot-S) tablet 1 tablet  1 tablet Oral Daily Steffanie Dunn, DO   1 tablet at 06/05/23 0919   sertraline (ZOLOFT) tablet 25 mg  25 mg Oral  Daily Marinus Maw, MD   25 mg at 06/05/23 4098   sodium chloride (OCEAN) 0.65 % nasal spray 1 spray  1 spray Each Nare PRN Massie Maroon, MD   1 spray at 06/06/23 1191   spironolactone (ALDACTONE) tablet 12.5 mg  12.5 mg Oral Daily Marinus Maw, MD   12.5 mg at 06/05/23 4782   traMADol (ULTRAM) tablet 50-100 mg  50-100 mg Oral Q4H PRN Marinus Maw, MD   100 mg at 05/31/23 2216     Discharge Medications: Please see discharge summary for a list of discharge medications.  Relevant Imaging Results:  Relevant Lab Results:   Additional Information [id number]  Delilah Shan, LCSWA

## 2023-06-07 ENCOUNTER — Inpatient Hospital Stay (HOSPITAL_COMMUNITY)

## 2023-06-07 DIAGNOSIS — D62 Acute posthemorrhagic anemia: Secondary | ICD-10-CM | POA: Diagnosis not present

## 2023-06-07 DIAGNOSIS — I4891 Unspecified atrial fibrillation: Secondary | ICD-10-CM | POA: Diagnosis not present

## 2023-06-07 DIAGNOSIS — I340 Nonrheumatic mitral (valve) insufficiency: Secondary | ICD-10-CM | POA: Diagnosis not present

## 2023-06-07 DIAGNOSIS — I5021 Acute systolic (congestive) heart failure: Secondary | ICD-10-CM | POA: Diagnosis not present

## 2023-06-07 LAB — BASIC METABOLIC PANEL
Anion gap: 11 (ref 5–15)
BUN: 29 mg/dL — ABNORMAL HIGH (ref 8–23)
CO2: 19 mmol/L — ABNORMAL LOW (ref 22–32)
Calcium: 8.5 mg/dL — ABNORMAL LOW (ref 8.9–10.3)
Chloride: 103 mmol/L (ref 98–111)
Creatinine, Ser: 0.87 mg/dL (ref 0.61–1.24)
GFR, Estimated: >60 mL/min (ref >60)
Glucose, Bld: 99 mg/dL (ref 70–99)
Potassium: 3.7 mmol/L (ref 3.5–5.1)
Sodium: 133 mmol/L — ABNORMAL LOW (ref 135–145)

## 2023-06-07 LAB — GLUCOSE, CAPILLARY
Glucose-Capillary: 115 mg/dL — ABNORMAL HIGH (ref 70–99)
Glucose-Capillary: 87 mg/dL (ref 70–99)
Glucose-Capillary: 147 mg/dL — ABNORMAL HIGH (ref 70–99)
Glucose-Capillary: 126 mg/dL — ABNORMAL HIGH (ref 70–99)

## 2023-06-07 LAB — CBC WITH DIFFERENTIAL/PLATELET
Abs Immature Granulocytes: 0.18 10*3/uL — ABNORMAL HIGH (ref 0.00–0.07)
Basophils Absolute: 0.1 10*3/uL (ref 0.0–0.1)
Basophils Relative: 0 %
Eosinophils Absolute: 0.1 10*3/uL (ref 0.0–0.5)
Eosinophils Relative: 1 %
HCT: 33.7 % — ABNORMAL LOW (ref 39.0–52.0)
Hemoglobin: 11.1 g/dL — ABNORMAL LOW (ref 13.0–17.0)
Immature Granulocytes: 1 %
Lymphocytes Relative: 6 %
Lymphs Abs: 1 10*3/uL (ref 0.7–4.0)
MCH: 30.2 pg (ref 26.0–34.0)
MCHC: 32.9 g/dL (ref 30.0–36.0)
MCV: 91.6 fL (ref 80.0–100.0)
Monocytes Absolute: 1.4 10*3/uL — ABNORMAL HIGH (ref 0.1–1.0)
Monocytes Relative: 9 %
Neutro Abs: 14 10*3/uL — ABNORMAL HIGH (ref 1.7–7.7)
Neutrophils Relative %: 83 %
Platelets: 424 10*3/uL — ABNORMAL HIGH (ref 150–400)
RBC: 3.68 MIL/uL — ABNORMAL LOW (ref 4.22–5.81)
RDW: 13.4 % (ref 11.5–15.5)
WBC: 16.8 10*3/uL — ABNORMAL HIGH (ref 4.0–10.5)
nRBC: 0 % (ref 0.0–0.2)

## 2023-06-07 MED ORDER — SODIUM CHLORIDE 0.9% FLUSH: 3.0000 mL | INTRAVENOUS | Status: DC | PRN

## 2023-06-07 MED ORDER — METOPROLOL SUCCINATE ER 50 MG PO TB24
50.0000 mg | ORAL_TABLET | Freq: Two times a day (BID) | ORAL | Status: DC
  Administered 2023-06-07 – 2023-06-09 (×5): 50 mg via ORAL
  Filled 2023-06-07 (×5): qty 1

## 2023-06-07 MED ORDER — ~~LOC~~ CARDIAC SURGERY, PATIENT & FAMILY EDUCATION: Freq: Once | Status: AC

## 2023-06-07 MED ORDER — SODIUM CHLORIDE 0.9 % IV SOLN: 250.0000 mL | INTRAVENOUS | Status: DC | PRN

## 2023-06-07 MED ORDER — SODIUM CHLORIDE 0.9% FLUSH: 3.0000 mL | Freq: Two times a day (BID) | INTRAVENOUS | Status: DC

## 2023-06-07 NOTE — Progress Notes (Signed)
 Rounding Note    Patient Name: EWEL LONA Date of Encounter: 06/07/2023  [facility] Cardiologist: [name], MD   Subjective   Feeling well, though fatigued.  No other acute complaints.  Inpatient Medications    Scheduled Meds:  amiodarone  400 mg Oral BID   apixaban  2.5 mg Oral BID   Followed by   apixaban  5 mg Oral BID   aspirin  81 mg Oral Daily   atorvastatin  20 mg Oral Daily   bisacodyl  10 mg Oral Daily   Or   bisacodyl  10 mg Rectal Daily   chlorhexidine  60 mL Topical Once   chlorhexidine  60 mL Topical Once   Chlorhexidine Gluconate Cloth  6 each Topical Daily   dapagliflozin propanediol  10 mg Oral Daily   feeding supplement  237 mL Oral BID BM   furosemide  40 mg Oral Daily   insulin aspart  0-24 Units Subcutaneous TID WC   insulin aspart  0-5 Units Subcutaneous QHS   magnesium gluconate  500 mg Oral Daily   metoprolol succinate  25 mg Oral BID   pantoprazole  40 mg Oral Daily   polyethylene glycol  17 g Oral Daily   potassium chloride  20 mEq Oral Daily   senna-docusate  1 tablet Oral Daily   sertraline  25 mg Oral Daily   spironolactone  12.5 mg Oral Daily   Continuous Infusions:  albumin human Stopped (05/28/23 0700)   PRN Meds: acetaminophen, albumin human, lactulose, metoprolol tartrate, ondansetron (ZOFRAN) IV, mouth rinse, oxyCODONE, sodium chloride, traMADol   Vital Signs    Vitals:   06/07/23 0500 06/07/23 0600 06/07/23 0700 06/07/23 0715  BP:  121/77 123/76   Pulse:  93 94   Resp:  (!) 21 (!) 23   Temp:    98.3 F (36.8 C)  TempSrc:    Oral  SpO2:  94% 94%   Weight: 84.7 kg     Height:        Intake/Output Summary (Last 24 hours) at 06/07/2023 0844 Last data filed at 06/06/2023 2000 Gross per 24 hour  Intake 620 ml  Output 300 ml  Net 320 ml      06/07/2023    5:00 AM 06/06/2023    5:30 AM 06/05/2023    5:00 AM  Last 3 Weights  Weight (lbs) 186 lb 11.7 oz 193 lb 9 oz 194 lb 3.6 oz  Weight (kg) 84.7 kg  87.8 kg 88.1 kg      Telemetry    Atrial fibrillation- Personally Reviewed  ECG     - Personally Reviewed  Physical Exam   GEN: No acute distress.   Neck: No JVD Cardiac: Irregular, no murmurs, rubs, or gallops.  Respiratory: Clear to auscultation bilaterally. GI: Soft, nontender, non-distended  MS: No edema; No deformity. Neuro:  Nonfocal  Psych: Normal affect   Labs    High Sensitivity Troponin:  No results for input(s): "TROPONINIHS" in the last 720 hours.   Chemistry Recent Labs  Lab 06/04/23 0512 06/05/23 0345 06/06/23 0329 06/07/23 0336  NA 130* 134* 134* 133*  K 3.5 3.5 3.9 3.7  CL 105 101 104 103  CO2 19* 20* 18* 19*  GLUCOSE 117* 112* 101* 99  BUN 29* 25* 27* 29*  CREATININE 0.98 0.92 0.91 0.87  CALCIUM 8.0* 8.6* 8.4* 8.5*  MG 2.1 1.9 2.0  --   GFRNONAA >60 >60 >60 >60  ANIONGAP 6  13 12 11     Lipids No results for input(s): "CHOL", "TRIG", "HDL", "LABVLDL", "LDLCALC", "CHOLHDL" in the last 168 hours.  Hematology Recent Labs  Lab 06/05/23 0345 06/06/23 0329 06/07/23 0336  WBC 14.0* 15.9* 16.8*  RBC 3.36* 3.54* 3.68*  HGB 10.3* 10.8* 11.1*  HCT 30.6* 31.9* 33.7*  MCV 91.1 90.1 91.6  MCH 30.7 30.5 30.2  MCHC 33.7 33.9 32.9  RDW 13.3 13.4 13.4  PLT 297 378 424*   Thyroid No results for input(s): "TSH", "FREET4" in the last 168 hours.  BNPNo results for input(s): "BNP", "PROBNP" in the last 168 hours.  DDimer No results for input(s): "DDIMER" in the last 168 hours.   Radiology    DG ABD ACUTE 2+V W 1V CHEST Result Date: 06/06/2023 CLINICAL DATA:  Ileus. EXAM: DG ABDOMEN ACUTE WITH 1 VIEW CHEST COMPARISON:  Chest radiographs 06/05/2023 and 06/04/2023. Chest CT 04/23/2023. Abdominal radiographs 04/07/2023. FINDINGS: Stable cardiomegaly post median sternotomy and mitral valve repair. Left subclavian pacemaker lead projects over the right atrium. The lungs are clear. There is no pleural effusion or pneumothorax. Nonspecific new diffuse moderate  colonic distension with scattered air-fluid levels on the erect examination. Gas extends into the rectum. No evidence of pneumoperitoneum. Gallstones, scattered vascular calcifications and lumbar spine degenerative changes are noted. IMPRESSION: 1. Nonspecific new diffuse moderate colonic distension with scattered air-fluid levels, likely reflecting ileus. If the patient has obstructive symptoms, consider further evaluation with CT. 2. No evidence of pneumoperitoneum. 3. No evidence of acute cardiopulmonary process. Electronically Signed   By: Carey Bullocks M.D.   On: 06/06/2023 14:02    Cardiac Studies     Patient Profile     78 y.o. male admitted for mitral valve, tricuspid valve repair, surgical maze with postop course complicated by significant bradycardia and sinus node dysfunction as well as atrial fibrillation  Assessment & Plan    1.  Sinus node dysfunction: Post pacemaker implant.  Pressure dressing removed today.  Site appears clean and dry.  2.  Persistent atrial fibrillation with rapid response: Early on amiodarone 400 mg twice daily and Toprol-XL 25 mg twice daily.  Heart rates are intermittently rapid.  Yoshito Gaza increase Toprol-XL to 50 mg twice daily.  Continue Eliquis.  Aly Hauser increase to 5 mg this evening.  3.  Acute systolic heart failure: Ejection fraction 30 to 35%.  Was normal in November.  Cherie Lasalle need repeat imaging as an outpatient.  EP to sign off for now.  Please call us back on the day of discharge for pacemaker instructions.  For questions or updates, please contact [facility] Please consult [email or web address] for contact info under        Signed, Anela Bensman Jorja Loa, MD  06/07/2023, 8:44 AM

## 2023-06-07 NOTE — Progress Notes (Signed)
 NAME:  [name] [name], MRN:  [id number], DOB:  [birth year], LOS: 11 ADMISSION DATE:  05/27/2023, CONSULTATION DATE:  2/18 REFERRING MD:  Leafy Ro, CHIEF COMPLAINT:  post op critical care support    History of Present Illness:  77 year old male w/ medical hx as outlined below. Followed by both cards and CVTS for mod/svr MR w/ progressive fatigue. Presented to [facility] 2/18 for elective MR/TR and MAZE Intra-op echo 45-50% EF, LV global hypokinesis, mildly elevated PAS,  RV mildly enlarged.  Bypass started 0852 ended 1118  cell saver  To CVICU post op  PCCM asked to assist w/ post op care  Pertinent  Medical History  Severe MR, mod TR, HFmrEF (EF 55%), Dysrhythmia, HL, PAF s/p ablation 2012 (on xarelto), Prior CVA,chronic UTIs, prostate cancer s/p prostatectomy  Significant Hospital Events: Including procedures, antibiotic start and stop dates in addition to other pertinent events    2/18 MAZE, MV repair, TV repair, extubated overnight 2/25 cardioversion, back in bradycardic rhythm requiring pacing. PPM placed.  Interim History / Subjective:  Stated feeling better Pain is controlled  Objective   Blood pressure 123/76, pulse 94, temperature 98.3 F (36.8 C), temperature source Oral, resp. rate (!) 23, height 5\' 10"  (1.778 m), weight 84.7 kg, SpO2 94%.        Intake/Output Summary (Last 24 hours) at 06/07/2023 8295 Last data filed at 06/06/2023 2000 Gross per 24 hour  Intake 620 ml  Output 300 ml  Net 320 ml   Filed Weights   06/05/23 0500 06/06/23 0530 06/07/23 0500  Weight: 88.1 kg 87.8 kg 84.7 kg    Examination: General: Elderly male, lying on the bed HEENT: [place]/AT, eyes anicteric.  moist mucus membranes Neuro: Alert, awake following commands Chest: Cental sternotomy incision is clean and dry, coarse breath sounds, no wheezes or rhonchi Heart: Irregularly irregular, no murmurs or gallops Abdomen: Soft, nontender, nondistended, bowel sounds present Skin: No rash  Na+ 133 K+  3.7 BUN 29 Cr 0.87 WBC 16.8 H/H 11.1/33.7 Platelets 424   Resolved Hospital Problem list   Consumptive thrombocytopenia- expected post-op Acute kidney injury  Assessment & Plan:  Severe mitral regurgitation and tricuspid regurgitation status post MVR and TV repair Persistent atrial fibrillation status post MAZE on 2/18 Acute HFrEF Sinus node dysfunction with tachy-brady syndrome s/p single chamber PPM. Continue aspirin and statin Heart rate intermittently close about 100 Amiodarone was decreased to 400 mg twice daily Toprol was increased to 50 mg twice daily Continue apixaban for stroke prophylaxis Continue diuretics Continue spironolactone Patient will require Entresto before discharge  Prediabetes with hyperglycemia Continue insulin with CBG goal 140-180 Blood sugars are controlled now  Hypervolemic hyponatremia Continue diuresis Monitor intake and output  Leukocytosis, likely reactive Remain afebrile, closely monitor No signs of active infection White count went up to  16.1  ABLA- expected post-op; stable Monitor H&H   Best Practice (right click and "Reselect all SmartList Selections" daily)   Diet/type: Regular consistency (see orders) DVT prophylaxis DOAC Pressure ulcer(s): N/A GI prophylaxis: PPI Lines: N/A Foley:  N/A Code Status:  full code Last date of multidisciplinary goals of care discussion [per primary ]  Labs   CBC: Recent Labs  Lab 06/03/23 1031 06/04/23 0512 06/05/23 0345 06/06/23 0329 06/07/23 0336  WBC 12.5* 11.9* 14.0* 15.9* 16.8*  NEUTROABS  --   --   --   --  14.0*  HGB 10.9* 10.4* 10.3* 10.8* 11.1*  HCT 33.0* 30.8* 30.6* 31.9* 33.7*  MCV 92.2  90.3 91.1 90.1 91.6  PLT 272 278 297 378 424*    Basic Metabolic Panel: Recent Labs  Lab 06/02/23 0345 06/02/23 1524 06/03/23 0221 06/04/23 0512 06/05/23 0345 06/06/23 0329 06/07/23 0336  NA 134*   < > 134* 130* 134* 134* 133*  K 3.3*   < > 3.4* 3.5 3.5 3.9 3.7  CL 94*   <  > 99 105 101 104 103  CO2 29   < > 26 19* 20* 18* 19*  GLUCOSE 102*   < > 101* 117* 112* 101* 99  BUN 30*   < > 28* 29* 25* 27* 29*  CREATININE 1.02   < > 1.00 0.98 0.92 0.91 0.87  CALCIUM 8.1*   < > 8.3* 8.0* 8.6* 8.4* 8.5*  MG 2.0  --  2.0 2.1 1.9 2.0  --    < > = values in this interval not displayed.   GFR: Estimated Creatinine Clearance: 69.9 mL/min (by C-G formula based on SCr of 0.87 mg/dL). Recent Labs  Lab 06/04/23 0512 06/05/23 0345 06/06/23 0329 06/07/23 0336  PROCALCITON  --  <0.10  --   --   WBC 11.9* 14.0* 15.9* 16.8*     Cheri Fowler, MD [facility] See Amion for pager If no response to pager, please call [phone] until 7pm After 7pm, Please call E-link [phone]

## 2023-06-07 NOTE — Progress Notes (Signed)
      [address]       [place] [address]             [phone]                 4 Days Post-Op Procedure(s) (LRB): PACEMAKER IMPLANT (N/A)   Events: No events _______________________________________________________________ Vitals: BP 123/76   Pulse 94   Temp 98.3 F (36.8 C) (Oral)   Resp (!) 23   Ht 5\' 10"  (1.778 m)   Wt 84.7 kg   SpO2 94%   BMI 26.79 kg/m  Filed Weights   06/05/23 0500 06/06/23 0530 06/07/23 0500  Weight: 88.1 kg 87.8 kg 84.7 kg     - Neuro: alert NAD  - Cardiovascular: atrial paced  Drips: none.      - Pulm: EWOB    ABG    Component Value Date/Time   PHART 7.510 (H) 06/01/2023 2230   PCO2ART 41.1 06/01/2023 2230   PO2ART 63 (L) 06/01/2023 2230   HCO3 32.8 (H) 06/01/2023 2230   TCO2 34 (H) 06/01/2023 2230   ACIDBASEDEF 5.0 (H) 05/27/2023 1859   O2SAT 94 06/01/2023 2230    - Abd: ND - Extremity: warm  .Intake/Output      02/28 0701 03/01 0700 03/01 0701 03/02 0700   P.O. 620    I.V. (mL/kg)     Total Intake(mL/kg) 620 (7.3)    Urine (mL/kg/hr) 300 (0.1)    Stool 0    Total Output 300    Net +320         Urine Occurrence 1 x    Stool Occurrence 1 x       _______________________________________________________________ Labs:    Latest Ref Rng & Units 06/07/2023    3:36 AM 06/06/2023    3:29 AM 06/05/2023    3:45 AM  CBC  WBC 4.0 - 10.5 K/uL 16.8  15.9  14.0   Hemoglobin 13.0 - 17.0 g/dL 91.4  78.2  95.6   Hematocrit 39.0 - 52.0 % 33.7  31.9  30.6   Platelets 150 - 400 K/uL 424  378  297       Latest Ref Rng & Units 06/07/2023    3:36 AM 06/06/2023    3:29 AM 06/05/2023    3:45 AM  CMP  Glucose 70 - 99 mg/dL 99  213  086   BUN 8 - 23 mg/dL 29  27  25    Creatinine 0.61 - 1.24 mg/dL 5.78  4.69  6.29   Sodium 135 - 145 mmol/L 133  134  134   Potassium 3.5 - 5.1 mmol/L 3.7  3.9  3.5   Chloride 98 - 111 mmol/L 103  104  101   CO2 22 - 32 mmol/L 19  18  20    Calcium 8.9 - 10.3 mg/dL 8.5  8.4  8.6      CXR: -   _______________________________________________________________  Assessment and Plan: S/p Mitral repair, tricuspid repair, and maze on 2/18  Neuro: pain controlled CV: PPM placed.  stable Pulm: IS, ambulation Renal: creat stable GI: on diet Heme: stable ID: afebrile Endo: SSI Dispo: floor   [name] [name] O [name] [name] 06/07/2023 9:30 AM

## 2023-06-08 LAB — BASIC METABOLIC PANEL
Anion gap: 11 (ref 5–15)
BUN: 32 mg/dL — ABNORMAL HIGH (ref 8–23)
CO2: 19 mmol/L — ABNORMAL LOW (ref 22–32)
Calcium: 8.7 mg/dL — ABNORMAL LOW (ref 8.9–10.3)
Chloride: 104 mmol/L (ref 98–111)
Creatinine, Ser: 0.91 mg/dL (ref 0.61–1.24)
GFR, Estimated: >60 mL/min (ref >60)
Glucose, Bld: 105 mg/dL — ABNORMAL HIGH (ref 70–99)
Potassium: 3.9 mmol/L (ref 3.5–5.1)
Sodium: 134 mmol/L — ABNORMAL LOW (ref 135–145)

## 2023-06-08 LAB — CBC
HCT: 35.9 % — ABNORMAL LOW (ref 39.0–52.0)
Hemoglobin: 11.9 g/dL — ABNORMAL LOW (ref 13.0–17.0)
MCH: 30.4 pg (ref 26.0–34.0)
MCHC: 33.1 g/dL (ref 30.0–36.0)
MCV: 91.8 fL (ref 80.0–100.0)
Platelets: 505 10*3/uL — ABNORMAL HIGH (ref 150–400)
RBC: 3.91 MIL/uL — ABNORMAL LOW (ref 4.22–5.81)
RDW: 13.4 % (ref 11.5–15.5)
WBC: 15.8 10*3/uL — ABNORMAL HIGH (ref 4.0–10.5)
nRBC: 0 % (ref 0.0–0.2)

## 2023-06-08 LAB — GLUCOSE, CAPILLARY: Glucose-Capillary: 87 mg/dL (ref 70–99)

## 2023-06-08 MED ORDER — METOCLOPRAMIDE HCL 5 MG/ML IJ SOLN
10.0000 mg | Freq: Four times a day (QID) | INTRAMUSCULAR | Status: AC
  Administered 2023-06-08: 10 mg via INTRAVENOUS
  Filled 2023-06-08 (×2): qty 2

## 2023-06-08 MED ORDER — SMOG ENEMA: 300.0000 mL | Freq: Once | RECTAL | Status: DC | PRN

## 2023-06-08 MED ORDER — LACTULOSE 10 GM/15ML PO SOLN
20.0000 g | Freq: Once | ORAL | Status: AC
  Administered 2023-06-08: 20 g via ORAL
  Filled 2023-06-08: qty 30

## 2023-06-08 NOTE — Progress Notes (Addendum)
      [address]       [place] [address]             [phone]      5 Days Post-Op Procedure(s) (LRB): PACEMAKER IMPLANT (N/A)  Subjective:  Patient states he is doing better than yesterday.  He denies N/V... He has still not moved his bowels and has minimal gas.  He has ambulated w/o difficulty.  Objective: Vital signs in last 24 hours: Temp:  [97.5 F (36.4 C)-98.1 F (36.7 C)] 97.6 F (36.4 C) (03/02 0316) Pulse Rate:  [78-98] 84 (03/02 0316) Cardiac Rhythm: Atrial fibrillation (03/01 1901) Resp:  [16-22] 18 (03/02 0316) BP: (101-135)/(71-82) 104/71 (03/02 0316) SpO2:  [89 %-98 %] 98 % (03/02 0316) Weight:  [85.2 kg] 85.2 kg (03/02 0316)    Intake/Output from previous day: 03/01 0701 - 03/02 0700 In: 240 [P.O.:240] Out: 900 [Urine:900]  General appearance: alert, cooperative, and no distress Heart: irregularly irregular rhythm Lungs: clear to auscultation bilaterally Abdomen: soft, + distention, good BS Extremities: edema trace Wound: clean and dry  Lab Results: Recent Labs    06/07/23 0336 06/08/23 0328  WBC 16.8* 15.8*  HGB 11.1* 11.9*  HCT 33.7* 35.9*  PLT 424* 505*   BMET:  Recent Labs    06/07/23 0336 06/08/23 0328  NA 133* 134*  K 3.7 3.9  CL 103 104  CO2 19* 19*  GLUCOSE 99 105*  BUN 29* 32*  CREATININE 0.87 0.91  CALCIUM 8.5* 8.7*    PT/INR: No results for input(s): "LABPROT", "INR" in the last 72 hours. ABG    Component Value Date/Time   PHART 7.510 (H) 06/01/2023 2230   HCO3 32.8 (H) 06/01/2023 2230   TCO2 34 (H) 06/01/2023 2230   ACIDBASEDEF 5.0 (H) 05/27/2023 1859   O2SAT 94 06/01/2023 2230   CBG (last 3)  Recent Labs    06/07/23 1148 06/07/23 1612 06/08/23 0603  GLUCAP 147* 126* 87    Assessment/Plan: S/P Procedure(s) (LRB): PACEMAKER IMPLANT (N/A)  CV- Tachy, Brady syndrome-currently in rate controlled Atrial Fibrillation- Toprol XL increased by EP yesterday, they have signed off.. also on  Amiodarone 400 mg BID, Aldactone, Farxiga Pulm- no acute issues, continue IS Renal- creatinine has been stable, weight is below admission, continue Lasix, potassium for now GI- Post operative Ileus- currently on liquid diet, limiting narcotics.Marland Kitchen will give Lactulose, suppository this morning.... if no relief can try enema later today, will also add reglan x 4 doses CBGs controlled- patient is not a diabetic, will d/c SSIP Deconditioning- PT/OT evals recommend SNF vs. CIR.Marland Kitchen will place CIR consult as this would better than SNF placement Dispo- patients HR much improved, now in rate controlled A. Fib...biggest issue currently is Ileus.Marland Kitchen aggressive bowel regimen today...   LOS: 12 days   Lowella Dandy, PA-C 06/08/2023  Agree with above PT/OT Dispo planning  Carnelius Hammitt O Lyris Hitchman

## 2023-06-09 ENCOUNTER — Other Ambulatory Visit: Payer: Self-pay

## 2023-06-09 DIAGNOSIS — K922 Gastrointestinal hemorrhage, unspecified: Secondary | ICD-10-CM | POA: Diagnosis not present

## 2023-06-09 DIAGNOSIS — I5021 Acute systolic (congestive) heart failure: Secondary | ICD-10-CM | POA: Diagnosis not present

## 2023-06-09 DIAGNOSIS — I340 Nonrheumatic mitral (valve) insufficiency: Secondary | ICD-10-CM | POA: Diagnosis not present

## 2023-06-09 DIAGNOSIS — K921 Melena: Secondary | ICD-10-CM

## 2023-06-09 DIAGNOSIS — D62 Acute posthemorrhagic anemia: Secondary | ICD-10-CM | POA: Diagnosis not present

## 2023-06-09 DIAGNOSIS — I4891 Unspecified atrial fibrillation: Secondary | ICD-10-CM | POA: Diagnosis not present

## 2023-06-09 LAB — COMPREHENSIVE METABOLIC PANEL
ALT: 25 U/L (ref 0–44)
AST: 25 U/L (ref 15–41)
Albumin: 2.5 g/dL — ABNORMAL LOW (ref 3.5–5.0)
Alkaline Phosphatase: 52 U/L (ref 38–126)
Anion gap: 14 (ref 5–15)
BUN: 42 mg/dL — ABNORMAL HIGH (ref 8–23)
CO2: 17 mmol/L — ABNORMAL LOW (ref 22–32)
Calcium: 7.6 mg/dL — ABNORMAL LOW (ref 8.9–10.3)
Chloride: 102 mmol/L (ref 98–111)
Creatinine, Ser: 1.8 mg/dL — ABNORMAL HIGH (ref 0.61–1.24)
GFR, Estimated: 38 mL/min — ABNORMAL LOW (ref >60)
Glucose, Bld: 267 mg/dL — ABNORMAL HIGH (ref 70–99)
Potassium: 5.2 mmol/L — ABNORMAL HIGH (ref 3.5–5.1)
Sodium: 133 mmol/L — ABNORMAL LOW (ref 135–145)
Total Bilirubin: 0.8 mg/dL (ref 0.0–1.2)
Total Protein: 5.1 g/dL — ABNORMAL LOW (ref 6.5–8.1)

## 2023-06-09 LAB — CBC
HCT: 29.2 % — ABNORMAL LOW (ref 39.0–52.0)
HCT: 27.6 % — ABNORMAL LOW (ref 39.0–52.0)
Hemoglobin: 9.7 g/dL — ABNORMAL LOW (ref 13.0–17.0)
Hemoglobin: 8.9 g/dL — ABNORMAL LOW (ref 13.0–17.0)
MCH: 30.3 pg (ref 26.0–34.0)
MCH: 29.5 pg (ref 26.0–34.0)
MCHC: 33.2 g/dL (ref 30.0–36.0)
MCHC: 32.2 g/dL (ref 30.0–36.0)
MCV: 91.3 fL (ref 80.0–100.0)
MCV: 91.4 fL (ref 80.0–100.0)
Platelets: 542 10*3/uL — ABNORMAL HIGH (ref 150–400)
Platelets: 474 10*3/uL — ABNORMAL HIGH (ref 150–400)
RBC: 3.2 MIL/uL — ABNORMAL LOW (ref 4.22–5.81)
RBC: 3.02 MIL/uL — ABNORMAL LOW (ref 4.22–5.81)
RDW: 13.3 % (ref 11.5–15.5)
RDW: 14.9 % (ref 11.5–15.5)
WBC: 16.7 10*3/uL — ABNORMAL HIGH (ref 4.0–10.5)
WBC: 21.3 10*3/uL — ABNORMAL HIGH (ref 4.0–10.5)
nRBC: 0.1 % (ref 0.0–0.2)
nRBC: 0.1 % (ref 0.0–0.2)

## 2023-06-09 LAB — GLUCOSE, CAPILLARY
Glucose-Capillary: 134 mg/dL — ABNORMAL HIGH (ref 70–99)
Glucose-Capillary: 254 mg/dL — ABNORMAL HIGH (ref 70–99)

## 2023-06-09 LAB — PROTIME-INR
INR: 3.4 — ABNORMAL HIGH (ref 0.8–1.2)
Prothrombin Time: 34.6 s — ABNORMAL HIGH (ref 11.4–15.2)

## 2023-06-09 LAB — APTT: aPTT: 35 s (ref 24–36)

## 2023-06-09 LAB — MAGNESIUM: Magnesium: 2.1 mg/dL (ref 1.7–2.4)

## 2023-06-09 LAB — PREPARE RBC (CROSSMATCH)

## 2023-06-09 MED ORDER — AMIODARONE IV BOLUS ONLY 150 MG/100ML
150.0000 mg | Freq: Once | INTRAVENOUS | Status: AC
  Administered 2023-06-09: 150 mg via INTRAVENOUS
  Filled 2023-06-09: qty 100

## 2023-06-09 MED ORDER — SODIUM CHLORIDE 0.9% IV SOLUTION: Freq: Once | INTRAVENOUS | Status: DC

## 2023-06-09 MED ORDER — NOREPINEPHRINE 4 MG/250ML-% IV SOLN
0.0000 ug/min | INTRAVENOUS | Status: DC
  Administered 2023-06-09: 6 ug/min via INTRAVENOUS
  Administered 2023-06-09: 13 ug/min via INTRAVENOUS
  Administered 2023-06-10: 14 ug/min via INTRAVENOUS
  Administered 2023-06-10: 11 ug/min via INTRAVENOUS
  Administered 2023-06-10: 16 ug/min via INTRAVENOUS
  Administered 2023-06-10 – 2023-06-11 (×2): 12 ug/min via INTRAVENOUS
  Administered 2023-06-11: 3 ug/min via INTRAVENOUS
  Administered 2023-06-11: 10 ug/min via INTRAVENOUS
  Filled 2023-06-09 (×7): qty 250

## 2023-06-09 MED ORDER — ALBUMIN HUMAN 5 % IV SOLN
12.5000 g | Freq: Once | INTRAVENOUS | Status: AC
  Administered 2023-06-09: 12.5 g via INTRAVENOUS
  Filled 2023-06-09: qty 250

## 2023-06-09 MED ORDER — SODIUM CHLORIDE 0.9 % IV SOLN
250.0000 mL | INTRAVENOUS | Status: AC
  Administered 2023-06-09: 250 mL via INTRAVENOUS

## 2023-06-09 MED ORDER — LACTATED RINGERS IV BOLUS
500.0000 mL | Freq: Once | INTRAVENOUS | Status: AC
  Administered 2023-06-09: 500 mL via INTRAVENOUS

## 2023-06-09 MED ORDER — NOREPINEPHRINE 4 MG/250ML-% IV SOLN
INTRAVENOUS | Status: AC
  Administered 2023-06-09: 2 ug/min via INTRAVENOUS
  Filled 2023-06-09: qty 250

## 2023-06-09 MED ORDER — PROTHROMBIN COMPLEX CONC HUMAN 500 UNITS IV KIT
2056.0000 [IU] | PACK | Status: AC
  Administered 2023-06-09: 2056 [IU] via INTRAVENOUS
  Filled 2023-06-09: qty 2056

## 2023-06-09 MED ORDER — DEXTROSE-SODIUM CHLORIDE 5-0.9 % IV SOLN: INTRAVENOUS | Status: AC

## 2023-06-09 MED ORDER — INSULIN ASPART 100 UNIT/ML IJ SOLN
0.0000 [IU] | INTRAMUSCULAR | Status: DC
  Administered 2023-06-09: 3 [IU] via SUBCUTANEOUS
  Administered 2023-06-10: 11 [IU] via SUBCUTANEOUS
  Administered 2023-06-10: 3 [IU] via SUBCUTANEOUS
  Administered 2023-06-10 (×3): 5 [IU] via SUBCUTANEOUS
  Administered 2023-06-10: 3 [IU] via SUBCUTANEOUS

## 2023-06-09 MED ORDER — PANTOPRAZOLE SODIUM 40 MG IV SOLR
40.0000 mg | Freq: Two times a day (BID) | INTRAVENOUS | Status: DC
  Administered 2023-06-09 – 2023-06-17 (×17): 40 mg via INTRAVENOUS
  Filled 2023-06-09 (×17): qty 10

## 2023-06-09 MED ORDER — NOREPINEPHRINE 4 MG/250ML-% IV SOLN: 2.0000 ug/min | INTRAVENOUS | Status: DC

## 2023-06-09 MED ORDER — SODIUM CHLORIDE 0.9% IV SOLUTION: Freq: Once | INTRAVENOUS | Status: AC

## 2023-06-09 MED ORDER — AMIODARONE HCL IN DEXTROSE 360-4.14 MG/200ML-% IV SOLN
60.0000 mg/h | INTRAVENOUS | Status: AC
  Administered 2023-06-09: 60 mg/h via INTRAVENOUS
  Filled 2023-06-09: qty 200

## 2023-06-09 MED ORDER — AMIODARONE HCL IN DEXTROSE 360-4.14 MG/200ML-% IV SOLN
60.0000 mg/h | INTRAVENOUS | Status: DC
  Administered 2023-06-09 – 2023-06-10 (×2): 30 mg/h via INTRAVENOUS
  Administered 2023-06-10 – 2023-06-15 (×19): 60 mg/h via INTRAVENOUS
  Filled 2023-06-09 (×21): qty 200

## 2023-06-09 NOTE — Progress Notes (Signed)
 Patient ID: [name] [name], male   DOB: 1946/03/03, 78 y.o.   MRN: [id number]  TCTS Evening Rounds:  Transferred to the ICU today for bloody stool and hypotension to 80's. Hgb was 11.9 this am and 9.7 at noon. Repeat pending this evening. He has not passed any more stool since transfer to ICU.  Awake and alert. He says his belly feels ok. No pain. Just feels washed out.  Still in AF 120's on IV amio.   SBP 87 on NE 12.   UO ok  Abd is soft and nontender. Mildly distended. No BS. Lungs clear No peripheral edema.  Seen by GI and CTA abdomen ordered. CCM is following.

## 2023-06-09 NOTE — Progress Notes (Addendum)
      [address]       [place] [address]             [phone]        6 Days Post-Op Procedure(s) (LRB): PACEMAKER IMPLANT (N/A)  Subjective: Patient had a "good bowel" movement yesterday. He denies abdominal pain, nausea, or vomiting.  Objective: Vital signs in last 24 hours: Temp:  [97.6 F (36.4 C)-98.1 F (36.7 C)] 97.7 F (36.5 C) (03/03 0341) Pulse Rate:  [79-88] 86 (03/03 0341) Cardiac Rhythm: Atrial fibrillation (03/02 1915) Resp:  [15-21] 16 (03/03 0341) BP: (98-109)/(66-83) 103/71 (03/03 0341) SpO2:  [95 %-98 %] 96 % (03/03 0341) Weight:  [84.5 kg] 84.5 kg (03/03 0341)  Pre op weight 93 kg Current Weight  06/09/23 84.5 kg      Intake/Output from previous day: 03/02 0701 - 03/03 0700 In: 240 [P.O.:240] Out: 500 [Urine:500]   Physical Exam:  Cardiovascular: IRRR IRRR Pulmonary: Clear to auscultation bilaterally Abdomen: Soft, distended, non tender, high pitched, "tinkling" bowel sounds present. Extremities: No lower extremity edema. Wound: Clean and dry.  No erythema or signs of infection. Dressing clean and dry PPM wound  Lab Results: CBC: Recent Labs    06/07/23 0336 06/08/23 0328  WBC 16.8* 15.8*  HGB 11.1* 11.9*  HCT 33.7* 35.9*  PLT 424* 505*   BMET:  Recent Labs    06/07/23 0336 06/08/23 0328  NA 133* 134*  K 3.7 3.9  CL 103 104  CO2 19* 19*  GLUCOSE 99 105*  BUN 29* 32*  CREATININE 0.87 0.91  CALCIUM 8.5* 8.7*    PT/INR:  Lab Results  Component Value Date   INR 1.7 (H) 05/27/2023   INR 1.2 05/23/2023   INR 1.19 07/10/2009   ABG:  INR: Will add last result for INR, ABG once components are confirmed Will add last 4 CBG results once components are confirmed  Assessment/Plan:  1. CV - History of PAF. Previous tachy/brady syndrome;s/p PPM 02/25. Remains in atrial fibrillation with rate up to low 100's this am. On Amiodarone 400 mg bid, Toprol XL 50 mg bid, Apixaban 5 mg bid 2.  Pulmonary - On room  air. Encourage incentive spirometer. 3. Acute systolic heart failure-LVEF by echo 02/25 is 35-40%. On Lasix 40 mg daily, Farxiga 10 mg daily, and Spironolactone 12.5 mg daily. He is below pre op weight so may be able to stop Lasix soon 4.  Expected post op acute blood loss anemia - H and H yesterday stable at 11.9 and 35.9 5. GI-Tolerating diet. Narcotics have been limited, given Lactulose 03/02 and scheduled Reglan. He has a resolving post op ileus 6. Deconditioned-continue PT/OT. 7. Disposition-SNF when ready  Donielle M ZimmermanPA-C 6:53 AM  Agree with above.

## 2023-06-09 NOTE — Progress Notes (Addendum)
 Patient remains hypotensive and is bleeding more from rectum. Nurse hanging Amiodarone drip. I spoke with Dr. Laneta Simmers, and will transfer to ICU, awaiting stat CBC result, and will consult GI.  Agree with above.

## 2023-06-09 NOTE — Plan of Care (Signed)

## 2023-06-09 NOTE — Progress Notes (Signed)
 eLink Physician-Brief Progress Note Patient Name: [name] [name] DOB: [birth year] MRN: [id number]   Date of Service  06/09/2023  HPI/Events of Note  Notified of Hgb 8.9 Above value is post transfusion, pt give a unit pRBC for Hgb of 9.7 Pt still on levophed, BSRN has been uptitrating this.  Otherwise, no signs of bleed at the moment.   eICU Interventions  Placed order to transfuse 1 unit pRBC        Sammy Douthitt M DELA CRUZ 06/09/2023, 7:53 PM   1:05 AM Repeat Hgb 9.5 following blood transfusion.  Remains hypotensive, on 60mcg/min levophed.  Had one large bowel movement which was dark red now as well.  Placed order to give one more unit pRBC.

## 2023-06-09 NOTE — Progress Notes (Signed)
 CARDIAC REHAB PHASE I     Pt back to Afib RVR. Will return at later time to offer walk and postop  [facility] education.   Woodroe Chen, RN BSN 06/09/2023 10:55 AM

## 2023-06-09 NOTE — Progress Notes (Signed)
 Inpatient Rehab Admissions Coordinator:   Consult received and chart reviewed.  Pt admitted for MV/TV repair as well as MAZE.  S/P DCCV with PPM placement.  Currently ambulating with no physical assist.  Significant assist needed for ADLs, particularly LB.  Post op course af/rvr.  Therapy currently recommending SNF.  Based on current documentation, pt does not meet requirement of needing intense rehabilitation in 2 therapy disciplines.  Note still working on rate/rhythm control.  We will sign off for now.  If pt declines with functional mobility, I'm happy to reassess.    Estill Dooms, PT, DPT Admissions Coordinator [phone] 06/09/23  11:59 AM

## 2023-06-09 NOTE — Progress Notes (Signed)
 PT Cancellation Note  Patient Details Name: [name] [name] MRN: [id number] DOB: 22-Apr-1945   Cancelled Treatment:    Reason Eval/Treat Not Completed: (P) Medical issues which prohibited therapy, pt transferred to higher level of care. Will check back as schedule allows to continue with PT POC.  Lenora Boys. PTA Acute Rehabilitation Services Office: [phone]    Catalina Antigua 06/09/2023, 2:02 PM

## 2023-06-09 NOTE — Progress Notes (Signed)
   06/09/23 1231  Vitals  Temp 97.6 F (36.4 C)  Temp Source Oral  BP (!) 78/65  MAP (mmHg) 72  BP Location Right Arm  BP Method Automatic  Patient Position (if appropriate) Lying  Pulse Rate (!) 129  Pulse Rate Source Monitor  ECG Heart Rate (!) 129  Resp (!) 21  Level of Consciousness  Level of Consciousness Alert  MEWS COLOR  MEWS Score Color Red  Oxygen Therapy  SpO2 96 %  O2 Device Room Air  MEWS Score  MEWS Temp 0  MEWS Systolic 2  MEWS Pulse 2  MEWS RR 1  MEWS LOC 0  MEWS Score 5   Patient complaints of pulsatile  abdominal pain, he rates 2 out of 10 and experienced 2 episode of rectal bleed one in bed pad and another in bed pan which was half of it,systolic blood pressure in 70s PA Donnielle at the bedside advice to start amiodarone drip without bolus,amio drip started

## 2023-06-09 NOTE — Consult Note (Addendum)
 Consultation Note   Referring Provider:  Cardiothoracic Surgery PCP: [name], MD Primary Gastroenterologist:  Gentry Fitz    Reason for Consultation:  GI bleed DOA: 05/27/2023         Hospital Day: 14   Attending physician's note  I have taken a history, reviewed the chart and examined the patient. I performed a substantive portion of this encounter, including complete performance of at least one of the key components, in conjunction with the APP. I agree with the APP's note, impression and recommendations.    78 yr M s/p Mitral and Tricuspid valve repair 05/27/23, MAZE developed acute episodes of large volve hematochezia with hemodynamic instability, transferred to [facility] ICU, on pressors On anticoagulation Eliquis  Concerning for acute lower GI arterial bleed/ diverticular hemorrhage CTA to further evaluate Supportive care per ICU team Monitor hgb and transfuse as needed PPI BID NPO Hold Eliquis  Iona Beard , MD [phone]    ASSESSMENT    Brief Narrative:  78 y.o. year old male who is s/p MAZE, mitral valve and tricuspid valve repair on 05/27/23. He has developed rectal bleeding   GI bleeding / hypotension on Eliquis. Presumably lower bleed with moderate amount of hematochezia.  Not sure bleeding enough to cause him to be in shock.. Some recent LLQ discomfort. Could be ischemic colitis given periods of hypotension.   ABLA.  Had post-op anemia. Hgb had stabilized to around 11 after surgery but now down to 9.7 in lower GI bleeding.   Post-op ileus.  No N/V. Bowel sounds on exam  Severe mitral regurgitation / tricuspid regurgitation s/p MVR and TV repair this admission.   Persistent atrial fibrillation s/p MAZE on 2/18 Acute HFrEF   Principal Problem:   S/P MVR (mitral valve repair) Active Problems:   Acute HFrEF (heart failure with reduced ejection fraction) (HCC)   Hyponatremia   Atrial fibrillation with  RVR (HCC)   Leukocytosis   ABLA (acute blood loss anemia)      PLAN:   --Getting doppler studies right now and ICU trying to stabilize him. Once relatively stable he will need CT angio ( ordered) -- Monitor H/H, transfuse as needed.  --Continue BID IV PPI empirically. Very low suspicion for brisk upper bleed  HPI    Patient has history of PAF and severe mitral regurgitation and moderate tricuspid regurgitation. He underwent mitral valve and tricuspid valve repair as well as MAZE procedure on 2/18.  Postoperatively he okay for first few days but then had persistent sinus node dysfunction and developed acute systolic heart failure.  For persistent atrial fibrillation he underwent TEE and cardioversion on 2/25.  He continued to have persistent atrial fibrillation with rapid response on amiodarone and beta-blocker.  Today patient began having some rectal bleeding with associated hypotension.  He was transferred to ICU.  At the time of this consultation the patient has had approximately 2 moderate-sized bloody bowel movements.  He is quite lethargic so not providing many details.  He does endorse some sharp LLQ pain which seems to started within the last day or two.  He is still hypertensive with SBP in the 70s    Recent Labs    06/07/23 0336 06/08/23 0328 06/09/23 1225  WBC  16.8* 15.8* 16.7*  HGB 11.1* 11.9* 9.7*  HCT 33.7* 35.9* 29.2*  PLT 424* 505* 542*   Recent Labs    06/07/23 0336 06/08/23 0328  NA 133* 134*  K 3.7 3.9  CL 103 104  CO2 19* 19*  GLUCOSE 99 105*  BUN 29* 32*  CREATININE 0.87 0.91  CALCIUM 8.5* 8.7*   No results for input(s): "PROT", "ALBUMIN", "AST", "ALT", "ALKPHOS", "BILITOT", "BILIDIR", "IBILI" in the last 72 hours. No results for input(s): "LABPROT", "INR" in the last 72 hours.    Past Medical History:  Diagnosis Date   Arthritis    Back   CVA (cerebral infarction) 07/14/2009   TIA   Dysrhythmia    A. Fib   Heart murmur    History of kidney  stones    Hyperlipidemia    Hypertension    Nephrolithiasis    Paroxysmal atrial fibrillation (HCC)    Paroxysmal atrial flutter (HCC)    a. s/p ablation 2012.   Prostate cancer (HCC)    Stroke [facility])    TIA - no deficits    Past Surgical History:  Procedure Laterality Date   APPENDECTOMY  1967   CARDIOVERSION N/A 06/03/2023   Procedure: CARDIOVERSION;  Surgeon: Jodelle Red, MD;  Location: [facility] INVASIVE CV LAB;  Service: Cardiovascular;  Laterality: N/A;   CATARACT EXTRACTION W/ INTRAOCULAR LENS IMPLANT Bilateral    CERVICAL SPINE SURGERY     Cadaver Bone Placed   COLONOSCOPY     CYSTOSCOPY/URETEROSCOPY/HOLMIUM LASER/STENT PLACEMENT Left 09/19/2020   Procedure: CYSTOSCOPY RETROGRADE PYELOGRAM URETEROSCOPY/HOLMIUM LASER/STENT PLACEMENT;  Surgeon: Bjorn Pippin, MD;  Location: [facility] ORS;  Service: Urology;  Laterality: Left;   EYE SURGERY Bilateral    Eye Lid lifting   LUMBAR SPINE SURGERY     Ruptured Disc   MAZE N/A 05/27/2023   Procedure: MAZE;  Surgeon: Eugenio Hoes, MD;  Location: [facility] OR;  Service: Open Heart Surgery;  Laterality: N/A;   MITRAL VALVE REPAIR N/A 05/27/2023   Procedure: MITRAL VALVE REPAIR USING MEDTRONIC SIMULUS SEMI-RIDGID ANNULOPLASTY BAND (MVR);  Surgeon: Eugenio Hoes, MD;  Location: [facility] OR;  Service: Open Heart Surgery;  Laterality: N/A;   PACEMAKER IMPLANT N/A 06/03/2023   Procedure: PACEMAKER IMPLANT;  Surgeon: Marinus Maw, MD;  Location: [facility] INVASIVE CV LAB;  Service: Cardiovascular;  Laterality: N/A;   PROSTATECTOMY  2005   RIGHT/LEFT HEART CATH AND CORONARY ANGIOGRAPHY N/A 02/27/2023   Procedure: RIGHT/LEFT HEART CATH AND CORONARY ANGIOGRAPHY;  Surgeon: [name], MD;  Location: [facility] INVASIVE CV LAB;  Service: Cardiovascular;  Laterality: N/A;   TEE WITHOUT CARDIOVERSION N/A 02/07/2023   Procedure: TRANSESOPHAGEAL ECHOCARDIOGRAM;  Surgeon: Thurmon Fair, MD;  Location: [facility] INVASIVE CV LAB;  Service: Cardiovascular;  Laterality: N/A;   TEE  WITHOUT CARDIOVERSION N/A 05/27/2023   Procedure: TRANSESOPHAGEAL ECHOCARDIOGRAM (TEE);  Surgeon: Eugenio Hoes, MD;  Location: [facility] OR;  Service: Open Heart Surgery;  Laterality: N/A;   TRANSESOPHAGEAL ECHOCARDIOGRAM (CATH LAB) N/A 06/03/2023   Procedure: TRANSESOPHAGEAL ECHOCARDIOGRAM;  Surgeon: Jodelle Red, MD;  Location: [facility] INVASIVE CV LAB;  Service: Cardiovascular;  Laterality: N/A;   TRICUSPID VALVE REPLACEMENT N/A 05/27/2023   Procedure: TRICUSPID VALVE REPAIR USING EDWARDS MC3 TRICUSPID ANNULOPLASTY RING;  Surgeon: Eugenio Hoes, MD;  Location: [facility] OR;  Service: Open Heart Surgery;  Laterality: N/A;    Family History  Problem Relation Age of Onset   Heart disease Mother 43       died   Prostate cancer Father  alive   Testicular cancer Brother        died    Prior to Admission medications   Medication Sig Start Date End Date Taking? Authorizing Provider  amoxicillin-clavulanate (AUGMENTIN) 500-125 MG tablet Take 1 tablet by mouth at bedtime.   Yes [provider]  atorvastatin (LIPITOR) 20 MG tablet Take 1 tablet by mouth once daily 05/21/23  Yes [name], [name], MD  furosemide (LASIX) 20 MG tablet Take 1 tablet (20 mg total) by mouth daily. Patient taking differently: Take 20 mg by mouth daily as needed for fluid or edema. 02/27/23 02/27/24 Yes [name], MD  losartan (COZAAR) 25 MG tablet Take 1 tablet (25 mg total) by mouth daily. Patient taking differently: Take 25 mg by mouth every evening. 02/14/23  Yes [name], MD  metoprolol succinate (TOPROL-XL) 50 MG 24 hr tablet Take 1 tablet (50 mg total) by mouth at bedtime. Take with or immediately following a meal. Patient taking differently: Take 75 mg by mouth at bedtime. Take with or immediately following a meal. 02/14/23  Yes [name], MD  rivaroxaban (XARELTO) 20 MG TABS tablet TAKE 1 TABLET BY MOUTH ONCE DAILY WITH SUPPER 01/27/23  Yes Marinus Maw, MD  traMADol (ULTRAM) 50 MG  tablet TAKE 1 TABLET BY MOUTH EVERY 6 HOURS AS NEEDED. 09/04/22  Yes [name], MD    Current Facility-Administered Medications  Medication Dose Route Frequency Provider Last Rate Last Admin   0.9 %  sodium chloride infusion (Manually program via Guardrails IV Fluids)   Intravenous Once Rochel Brome C, NP       0.9 %  sodium chloride infusion  250 mL Intravenous Continuous Cheri Fowler, MD       acetaminophen (TYLENOL) tablet 325-650 mg  325-650 mg Oral Q4H PRN Marinus Maw, MD       amiodarone (NEXTERONE PREMIX) 360-4.14 MG/200ML-% (1.8 mg/mL) IV infusion  60 mg/hr Intravenous Continuous Doree Fudge M, PA-C 33.3 mL/hr at 06/09/23 1400 60 mg/hr at 06/09/23 1400   Followed by   amiodarone (NEXTERONE PREMIX) 360-4.14 MG/200ML-% (1.8 mg/mL) IV infusion  30 mg/hr Intravenous Continuous Doree Fudge M, PA-C       amiodarone (PACERONE) tablet 400 mg  400 mg Oral BID Gold, Wayne E, PA-C   400 mg at 06/09/23 8469   atorvastatin (LIPITOR) tablet 20 mg  20 mg Oral Daily Marinus Maw, MD   20 mg at 06/09/23 6295   Chlorhexidine Gluconate Cloth 2 % PADS 6 each  6 each Topical Daily Marinus Maw, MD   6 each at 06/09/23 [phone]   dapagliflozin propanediol (FARXIGA) tablet 10 mg  10 mg Oral Daily Karie Fetch P, DO   10 mg at 06/09/23 0843   feeding supplement (ENSURE ENLIVE / ENSURE PLUS) liquid 237 mL  237 mL Oral BID BM Marinus Maw, MD   237 mL at 06/05/23 1452   lactated ringers bolus 500 mL  500 mL Intravenous Once Cheri Fowler, MD       lactulose (CHRONULAC) 10 GM/15ML solution 30 g  30 g Oral Daily PRN Gershon Crane E, PA-C   30 g at 06/06/23 1347   magnesium gluconate (MAGONATE) tablet 500 mg  500 mg Oral Daily Gold, Wayne E, PA-C   500 mg at 06/09/23 0933   norepinephrine (LEVOPHED) 4mg  in (0.016 mg/mL) premix infusion  2-10 mcg/min Intravenous Titrated Cheri Fowler, MD 7.5 mL/hr at 06/09/23 1408 2 mcg/min at 06/09/23  1408   ondansetron (ZOFRAN) injection 4 mg   4 mg Intravenous Q6H PRN Marinus Maw, MD   4 mg at 06/09/23 1423   Oral care mouth rinse  15 mL Mouth Rinse PRN Marinus Maw, MD       oxyCODONE (Oxy IR/ROXICODONE) immediate release tablet 5 mg  5 mg Oral Q12H PRN Gold, Wayne E, PA-C       pantoprazole (PROTONIX) injection 40 mg  40 mg Intravenous Q12H Cheri Fowler, MD       senna-docusate (Senokot-S) tablet 1 tablet  1 tablet Oral Daily Steffanie Dunn, DO   1 tablet at 06/09/23 1610   sertraline (ZOLOFT) tablet 25 mg  25 mg Oral Daily Marinus Maw, MD   25 mg at 06/09/23 9604   sodium chloride (OCEAN) 0.65 % nasal spray 1 spray  1 spray Each Nare PRN Massie Maroon, MD   1 spray at 06/06/23 0526   sorbitol, magnesium hydroxide, mineral oil, glycerin (SMOG) enema  300 mL Rectal Once PRN Barrett, Erin R, PA-C       traMADol (ULTRAM) tablet 50-100 mg  50-100 mg Oral Q4H PRN Marinus Maw, MD   100 mg at 05/31/23 2216    Allergies as of 04/30/2023   (No Known Allergies)    Social History   Socioeconomic History   Marital status: Married    Spouse name: Not on file   Number of children: 2   Years of education: Not on file   Highest education level: Not on file  Occupational History   Occupation: Personnel officer: [employer]  Tobacco Use   Smoking status: Never   Smokeless tobacco: Never  Vaping Use   Vaping status: Never Used  Substance and Sexual Activity   Alcohol use: No   Drug use: No   Sexual activity: Yes  Other Topics Concern   Not on file  Social History Narrative   Not on file   Social Drivers of Health   Financial Resource Strain: Low Risk  (10/31/2022)   Overall Financial Resource Strain (CARDIA)    Difficulty of Paying Living Expenses: Not hard at all  Food Insecurity: No Food Insecurity (05/28/2023)   Hunger Vital Sign    Worried About Running Out of Food in the Last Year: Never true    Ran Out of Food in the Last Year: Never true  Transportation Needs: No Transportation Needs  (05/28/2023)   PRAPARE - Administrator, Civil Service (Medical): No    Lack of Transportation (Non-Medical): No  Physical Activity: Insufficiently Active (10/31/2022)   Exercise Vital Sign    Days of Exercise per Week: 3 days    Minutes of Exercise per Session: 20 min  Stress: No Stress Concern Present (10/31/2022)   [employer] of Occupational Health - Occupational Stress Questionnaire    Feeling of Stress : Not at all  Social Connections: Socially Integrated (05/28/2023)   Social Connection and Isolation Panel [NHANES]    Frequency of Communication with Friends and Family: More than three times a week    Frequency of Social Gatherings with Friends and Family: Three times a week    Attends Religious Services: More than 4 times per year    Active Member of Clubs or Organizations: Yes    Attends Banker Meetings: 1 to 4 times per year    Marital Status: Married  Catering manager Violence: Not At Risk (05/28/2023)  Humiliation, Afraid, Rape, and Kick questionnaire    Fear of Current or Ex-Partner: No    Emotionally Abused: No    Physically Abused: No    Sexually Abused: No     Code Status   Code Status: Full Code  Review of Systems: All systems reviewed and negative except where noted in HPI.  Physical Exam: Vital signs in last 24 hours: Temp:  [97.6 F (36.4 C)-98.1 F (36.7 C)] 97.6 F (36.4 C) (03/03 1231) Pulse Rate:  [84-129] 117 (03/03 1415) Resp:  [11-24] 19 (03/03 1415) BP: (72-122)/(52-110) 89/65 (03/03 1415) SpO2:  [95 %-99 %] 97 % (03/03 1415) Weight:  [84.5 kg] 84.5 kg (03/03 0341) Last BM Date : 06/08/23  General:  Pleasant male who is becoming increasingly lethargic with hypotension Psych:  Cooperative.  Eyes: Pupils equal Ears:  Normal auditory acuity Nose: No deformity, discharge or lesions Neck:  Supple, no masses felt Lungs:  Clear to auscultation.  Heart:  Regular rate, regular rhythm.  Abdomen:  Soft, mildly  distended, nontender, active bowel sounds, no masses felt Rectal :  Deferred Msk: Symmetrical without gross deformities.  Skin:  Intact without significant lesions.    Intake/Output from previous day: 03/02 0701 - 03/03 0700 In: 240 [P.O.:240] Out: 500 [Urine:500] Intake/Output this shift:  Total I/O In: 563.2 [P.O.:240; I.V.:161.1; IV Piggyback:162.2] Out: 950 [Urine:950]   Willette Cluster, NP-C   06/09/2023, 2:38 PM

## 2023-06-09 NOTE — Progress Notes (Incomplete)
 Notified earlier today that the patient had gone back into AFib w/RVR 120s by CTS APP for our thoughts on rate/rhythm change PO amiodarone > back to gtt On OAC  Her suspicion was that his hypotension was 2/2 post PO meds and was giving albumin to support Planned for the amiodarone > and would call EP if further assistance was needed.  Checked on the chart Patient has been sent to [facility] for lower GI bleed, persistent hypotension Eliquis stopped >> getting PRBC, on levophed OFF amiodarone Metoprolol, furosemide, spironolactone

## 2023-06-09 NOTE — Progress Notes (Signed)
 Pt BP dropped and pt is in Afib RVR however pt seems to have a GI bleed as well. Transfer order placed and PA came to bedside  Amio drip started .     06/09/23 1231  Vitals  Temp 97.6 F (36.4 C)  Temp Source Oral  BP (!) 78/65  MAP (mmHg) 72  BP Location Right Arm  BP Method Automatic  Patient Position (if appropriate) Lying  Pulse Rate (!) 129  Pulse Rate Source Monitor  ECG Heart Rate (!) 129  Resp (!) 21  Level of Consciousness  Level of Consciousness Alert  Oxygen Therapy  SpO2 96 %  O2 Device Room Air  O2 Flow Rate (L/min) 0 L/min  MEWS Score  MEWS Temp 0  MEWS Systolic 2  MEWS Pulse 2  MEWS RR 1  MEWS LOC 0  MEWS Score 5  MEWS Score Color Red

## 2023-06-09 NOTE — Progress Notes (Signed)
 Patient daughter updated about the transfer of the patient  to [phone]

## 2023-06-09 NOTE — Progress Notes (Addendum)
 Nurse paged me earlier regarding a fib rate (above 110's). I ordered IV bolus of Amiodarone. She already gave oral Amiodarone 400 mg and Toprol XL 50 mg. She paged me again that rate was better controlled but again is not. I have a page in to EP to assist with medication regimen. Nurse page me again that patient's systolic BP is now in the 80's and per nurse, she gave both Lasix and Spironolactone together. She also reported that he had about 10 ml of blood when he had a bowel movement. Per patient, he does not have a history of hemorrhoids but on exam he does. He is on Protonix and Apixaban. His H and H yesterday was stable at 11.9 and 35.9. He is not on oral iron. Will give Albumin for low BP, check CBC this am, and stop Lasix. May need to consult GI.  Update as of 11:50 am, I spoke with EP PA on call. Will put on Amiodarone drip. Hopefully, albumin and controlling a fib rate will help improve BP. I stopped Toprol XL and Spironolactone as well. I asked nurse to notify me if patient develops phlebitis since on Amiodarone IV.  Also, he has had some more bleeding from rectum. As discussed with Dr. Laneta Simmers, will stop Apixaban for now. Awaiting CBC.  Agree with above. I have been in the OR today but he has been watched closely by PA. He needed transfer to ICU.

## 2023-06-09 NOTE — Progress Notes (Signed)
 NAME:  [name] [name], MRN:  [id number], DOB:  10-Dec-1945, LOS: 13 ADMISSION DATE:  05/27/2023, CONSULTATION DATE:  2/18 REFERRING MD:  Leafy Ro, CHIEF COMPLAINT:  post op critical care support    History of Present Illness:  78 year old male w/ medical hx as outlined below. Followed by both cards and CVTS for mod/svr MR w/ progressive fatigue. Presented to [facility] 2/18 for elective MR/TR and MAZE Intra-op echo 45-50% EF, LV global hypokinesis, mildly elevated PAS,  RV mildly enlarged.  Bypass started 0852 ended 1118  cell saver  To CVICU post op  PCCM asked to assist w/ post op care   Re-consulted on 3/3 for Afib RVR and Acute GI bleed.    Pertinent  Medical History  Severe MR, mod TR, HFmrEF (EF 55%), Dysrhythmia, HL, PAF s/p ablation 2012 (on xarelto), Prior CVA,chronic UTIs, prostate cancer s/p prostatectomy  Significant Hospital Events: Including procedures, antibiotic start and stop dates in addition to other pertinent events    2/18 MAZE, MV repair, TV repair, extubated overnight 2/25 cardioversion, back in bradycardic rhythm requiring pacing. PPM placed. 3/3 Originally transferred out of ICU, re-consult due to GI bleed and Afib RVR on amio gtt and levophed   Interim History / Subjective:  Patient transferred from progressive, lethargic, following commands   Objective   Blood pressure (!) 85/52, pulse (!) 124, temperature 97.6 F (36.4 C), temperature source Oral, resp. rate (!) 24, height 5\' 10"  (1.778 m), weight 84.5 kg, SpO2 98%.        Intake/Output Summary (Last 24 hours) at 06/09/2023 1404 Last data filed at 06/09/2023 4782 Gross per 24 hour  Intake 240 ml  Output 1450 ml  Net -1210 ml   Filed Weights   06/07/23 0500 06/08/23 0316 06/09/23 0341  Weight: 84.7 kg 85.2 kg 84.5 kg    Examination: General: acute on chronic older adult male lying in ICU bed, hypotensive HEENT: Normocephalic, PERRLA intact, pale moist MM CV: s1,s2, irregular-afib moderate response,  no MRG, No JVD  pulm: clear, diminished, tachypnea Abs: bs active, soft  Extremities: no edema, no deformity, moves all extremities on command Skin: Pale no rash  Neuro: Rass 0, responds to painful stimuli, cough gag reflex present  GU: male pure wick, intact    Resolved Hospital Problem list   Consumptive thrombocytopenia- expected post-op Acute kidney injury  Assessment & Plan:  Acute GI bleed, hemorrhagic shock Patient transferred from progressive, 2 medium to large Bright red bloody BM per RN staff  Was eliquis for afib  Hgb drop from 11.9 to 9.7  P: STAT type and screen  Give 1 unit of PRBCs STAT CVC placed right femoral  Start 500 cc LR bolus  Post CBC after transfusion, check serial CBC q4  GI consult, following  Check Coags Hold anticoagulation for now, eliquis dc'd   Continue to titrate levophed for MAP >65  Keep NPO  Once patient stable, CT angio for GI bleed protocol   Severe mitral regurgitation and tricuspid regurgitation status post MVR and TV repair Persistent atrial fibrillation status post MAZE on 2/18 Acute HFrEF Sinus node dysfunction with tachy-brady syndrome s/p single chamber PPM (2/25)  Patient originally receiving 400mg  P: TCTS primary, appreciate assistance  Continue amio gtt per TCTs Hold metoprolol and spironolactone in setting of shock  Hold diuretics  Resume GDMT  Hold statin   Prediabetes with hyperglycemia P: CBG goal 140 to 180 Place on sensitive SSI, CBG q4   Hypervolemic hyponatremia P:  Hold diuretics in setting of shock   Leukocytosis, likely reactive Remain afebrile, closely monitor No signs of fever or active infection  WBC 16.7 on 3/3 P: Continue to trend WBC Continue to trend fever curve    Best Practice (right click and "Reselect all SmartList Selections" daily)   Diet/type: Regular consistency (see orders) DVT prophylaxis  on hold  Pressure ulcer(s): N/A GI prophylaxis: PPI Lines: N/A Foley:  N/A Code  Status:  full code Last date of multidisciplinary goals of care discussion [updated patient at beside 3/30]   Hazel Sams AGACNP-BC   [facility] 06/09/2023, 3:55 PM  Please see [email or web address] for pager details.  From 7A-7P if no response, please call [phone]. After hours, please call ELink [phone].

## 2023-06-09 NOTE — Progress Notes (Addendum)
 Central Venous Catheter Insertion Procedure Note  [name] [name]  [id number]  Jul 19, 1945  Date:06/09/23  Time:3:24 PM   Provider Performing:Gracey Tolle Erby Pian   Procedure: Insertion of Non-tunneled Central Venous [phone]) with US guidance (56213)    Indication(s) Medication administration and Difficult access  Consent Risks of the procedure as well as the alternatives and risks of each were explained to the patient and/or caregiver.  Consent for the procedure was obtained and is signed in the bedside chart  Anesthesia Topical only with 1% lidocaine   Timeout Verified patient identification, verified procedure, site/side was marked, verified correct patient position, special equipment/implants available, medications/allergies/relevant history reviewed, required imaging and test results available.  Sterile Technique Maximal sterile technique including full sterile barrier drape, hand hygiene, sterile gown, sterile gloves, mask, hair covering, sterile ultrasound probe cover (if used).  Procedure Description Area of catheter insertion was cleaned with chlorhexidine and draped in sterile fashion.  With real-time ultrasound guidance a central venous catheter was placed into the right femoral vein. Nonpulsatile blood flow and easy flushing noted in all ports.  The catheter was sutured in place and sterile dressing applied.  Complications/Tolerance None; patient tolerated the procedure well. Chest X-ray is ordered to verify placement for internal jugular or subclavian cannulation.   Chest x-ray is not ordered for femoral cannulation.  EBL Minimal  Specimen(s) None   Hazel Sams AGACNP-BC   [facility] 06/09/2023, 3:24 PM  Please see [email or web address] for pager details.  From 7A-7P if no response, please call [phone]. After hours, please call ELink [phone].

## 2023-06-09 NOTE — Progress Notes (Signed)
 Patient went back to Afib with RVR confirmed with 12 lead EKG,pt state he felt exhausted and he had left lower abdominal pain,bitals taken PA Donielle notified advice to give 150 mg of amiodarone bolus and asked to page her back if his heart rate sustain again in 120s and watch for hematuria ,150 mg of amio bolus given heart rate remains in 110s after the bolus,vitals checked ,pt state he feels a little better

## 2023-06-10 DIAGNOSIS — D72829 Elevated white blood cell count, unspecified: Secondary | ICD-10-CM | POA: Diagnosis not present

## 2023-06-10 DIAGNOSIS — R571 Hypovolemic shock: Secondary | ICD-10-CM

## 2023-06-10 DIAGNOSIS — N179 Acute kidney failure, unspecified: Secondary | ICD-10-CM

## 2023-06-10 DIAGNOSIS — I4891 Unspecified atrial fibrillation: Secondary | ICD-10-CM | POA: Diagnosis not present

## 2023-06-10 DIAGNOSIS — K922 Gastrointestinal hemorrhage, unspecified: Secondary | ICD-10-CM | POA: Diagnosis not present

## 2023-06-10 DIAGNOSIS — I361 Nonrheumatic tricuspid (valve) insufficiency: Secondary | ICD-10-CM

## 2023-06-10 LAB — BASIC METABOLIC PANEL
Anion gap: 14 (ref 5–15)
BUN: 56 mg/dL — ABNORMAL HIGH (ref 8–23)
CO2: 14 mmol/L — ABNORMAL LOW (ref 22–32)
Calcium: 7.1 mg/dL — ABNORMAL LOW (ref 8.9–10.3)
Chloride: 103 mmol/L (ref 98–111)
Creatinine, Ser: 2.57 mg/dL — ABNORMAL HIGH (ref 0.61–1.24)
GFR, Estimated: 25 mL/min — ABNORMAL LOW (ref >60)
Glucose, Bld: 240 mg/dL — ABNORMAL HIGH (ref 70–99)
Potassium: 4.9 mmol/L (ref 3.5–5.1)
Sodium: 131 mmol/L — ABNORMAL LOW (ref 135–145)

## 2023-06-10 LAB — PHOSPHORUS: Phosphorus: 6.4 mg/dL — ABNORMAL HIGH (ref 2.5–4.6)

## 2023-06-10 LAB — CBC
HCT: 27.8 % — ABNORMAL LOW (ref 39.0–52.0)
HCT: 28.8 % — ABNORMAL LOW (ref 39.0–52.0)
HCT: 30.9 % — ABNORMAL LOW (ref 39.0–52.0)
Hemoglobin: 10.7 g/dL — ABNORMAL LOW (ref 13.0–17.0)
Hemoglobin: 9.5 g/dL — ABNORMAL LOW (ref 13.0–17.0)
Hemoglobin: 9.5 g/dL — ABNORMAL LOW (ref 13.0–17.0)
MCH: 29.8 pg (ref 26.0–34.0)
MCH: 29.9 pg (ref 26.0–34.0)
MCH: 30.2 pg (ref 26.0–34.0)
MCHC: 33 g/dL (ref 30.0–36.0)
MCHC: 34.2 g/dL (ref 30.0–36.0)
MCHC: 34.6 g/dL (ref 30.0–36.0)
MCV: 87.1 fL (ref 80.0–100.0)
MCV: 87.3 fL (ref 80.0–100.0)
MCV: 90.6 fL (ref 80.0–100.0)
Platelets: 341 10*3/uL (ref 150–400)
Platelets: 363 10*3/uL (ref 150–400)
Platelets: 393 10*3/uL (ref 150–400)
RBC: 3.18 MIL/uL — ABNORMAL LOW (ref 4.22–5.81)
RBC: 3.19 MIL/uL — ABNORMAL LOW (ref 4.22–5.81)
RBC: 3.54 MIL/uL — ABNORMAL LOW (ref 4.22–5.81)
RDW: 15.2 % (ref 11.5–15.5)
RDW: 15.3 % (ref 11.5–15.5)
RDW: 15.4 % (ref 11.5–15.5)
WBC: 21.4 10*3/uL — ABNORMAL HIGH (ref 4.0–10.5)
WBC: 24.6 10*3/uL — ABNORMAL HIGH (ref 4.0–10.5)
WBC: 25.9 10*3/uL — ABNORMAL HIGH (ref 4.0–10.5)
nRBC: 0.1 % (ref 0.0–0.2)
nRBC: 0.2 % (ref 0.0–0.2)
nRBC: 0.2 % (ref 0.0–0.2)

## 2023-06-10 LAB — GLUCOSE, CAPILLARY
Glucose-Capillary: 119 mg/dL — ABNORMAL HIGH (ref 70–99)
Glucose-Capillary: 152 mg/dL — ABNORMAL HIGH (ref 70–99)
Glucose-Capillary: 158 mg/dL — ABNORMAL HIGH (ref 70–99)
Glucose-Capillary: 238 mg/dL — ABNORMAL HIGH (ref 70–99)
Glucose-Capillary: 239 mg/dL — ABNORMAL HIGH (ref 70–99)
Glucose-Capillary: 244 mg/dL — ABNORMAL HIGH (ref 70–99)
Glucose-Capillary: 34 mg/dL — CL (ref 70–99)
Glucose-Capillary: 341 mg/dL — ABNORMAL HIGH (ref 70–99)

## 2023-06-10 LAB — MAGNESIUM: Magnesium: 2.1 mg/dL (ref 1.7–2.4)

## 2023-06-10 LAB — PREPARE RBC (CROSSMATCH)

## 2023-06-10 MED ORDER — PIPERACILLIN-TAZOBACTAM 3.375 G IVPB
3.3750 g | Freq: Three times a day (TID) | INTRAVENOUS | Status: AC
  Administered 2023-06-10 – 2023-06-16 (×20): 3.375 g via INTRAVENOUS
  Filled 2023-06-10 (×21): qty 50

## 2023-06-10 MED ORDER — AMIODARONE IV BOLUS ONLY 150 MG/100ML
150.0000 mg | Freq: Once | INTRAVENOUS | Status: AC
  Administered 2023-06-10: 150 mg via INTRAVENOUS
  Filled 2023-06-10: qty 100

## 2023-06-10 MED ORDER — SODIUM CHLORIDE 0.9% IV SOLUTION: Freq: Once | INTRAVENOUS | Status: DC

## 2023-06-10 MED ORDER — INSULIN GLARGINE 100 UNIT/ML ~~LOC~~ SOLN
5.0000 [IU] | Freq: Two times a day (BID) | SUBCUTANEOUS | Status: DC
  Administered 2023-06-10 – 2023-06-11 (×2): 5 [IU] via SUBCUTANEOUS
  Filled 2023-06-10 (×5): qty 0.05

## 2023-06-10 MED ORDER — GERHARDT'S BUTT CREAM
TOPICAL_CREAM | CUTANEOUS | Status: DC | PRN
  Filled 2023-06-10: qty 60

## 2023-06-10 MED ORDER — FENTANYL CITRATE PF 50 MCG/ML IJ SOSY: 12.5000 ug | PREFILLED_SYRINGE | INTRAMUSCULAR | Status: DC | PRN

## 2023-06-10 MED ORDER — AMIODARONE LOAD VIA INFUSION
150.0000 mg | Freq: Once | INTRAVENOUS | Status: AC
  Administered 2023-06-10: 150 mg via INTRAVENOUS

## 2023-06-10 MED ORDER — LIDOCAINE HCL (PF) 1 % IJ SOLN
5.0000 mL | Freq: Once | INTRAMUSCULAR | Status: AC
  Administered 2023-06-10: 5 mL
  Filled 2023-06-10: qty 5

## 2023-06-10 NOTE — Progress Notes (Signed)
 Physical Therapy Treatment Patient Details Name: [name] [name] MRN: [id number] DOB: [birth year] Today's Date: 06/10/2023   History of Present Illness 78 y/o male admitted 2/18 for mitral and tricuspid valve repair, MAZE same date. 2/25 DCCV, PPM placed.  3/3 pt transferred to ICU due to hypotension and bleeding from rectum. PMhx: Mitral regurgitation, paroxysmal Afib, CVA, HTN, HLD, Prostate CA, glaucoma    PT Comments  Pt transferred to ICU yesterday due to GIB and hypotension. Attempted to transfer pt to chair however pt only able to get to EOB due to pt with report of lightheadedness, drop in BP, and inability to capture SPO2. Pt also with loss of color in lips while sitting on EOB. Pt remains A&Ox4 and was able to follow commands and recite sternal precautions and reports minimal use of L UE due to pacemaker. Acute PT to cont to follow.    If plan is discharge home, recommend the following: A little help with walking and/or transfers;A little help with bathing/dressing/bathroom;Assistance with cooking/housework;Assist for transportation;Help with stairs or ramp for entrance   Can travel by private vehicle     Yes  Equipment Recommendations  Rolling walker (2 wheels);BSC/3in1    Recommendations for Other Services       Precautions / Restrictions Precautions Precautions: Sternal;Fall;ICD/Pacemaker (urinary incontinence) Precaution Booklet Issued: Yes (comment) Recall of Precautions/Restrictions: Impaired Precaution/Restrictions Comments: provided PPM precaution progression handout Restrictions Weight Bearing Restrictions Per Provider Order: Yes RUE Weight Bearing Per Provider Order: Partial weight bearing LUE Weight Bearing Per Provider Order: Partial weight bearing     Mobility  Bed Mobility Overal bed mobility: Needs Assistance Bed Mobility: Supine to Sit, Sit to Supine     Supine to sit: HOB elevated, Mod assist Sit to supine: Mod assist   General bed mobility comments:  cues for technique, assist to raise trunk and for LEs back into bed    Transfers                   General transfer comment: unable to stand due to hypotension, pt with lightheadedness and demonstrating loss of color. RN present and agreed to return to supine    Ambulation/Gait               General Gait Details: unable this date   Stairs             Wheelchair Mobility     Tilt Bed    Modified Rankin (Stroke Patients Only)       Balance Overall balance assessment: Needs assistance Sitting-balance support: No upper extremity supported, Feet supported Sitting balance-Leahy Scale: Fair                                      Hotel manager: No apparent difficulties  Cognition Arousal: Alert Behavior During Therapy: WFL for tasks assessed/performed   PT - Cognitive impairments: Memory                       PT - Cognition Comments: pt aware of sternal precautions and limiting use of L UE due to pacemaker placement, pt with good command follow and insight to symptoms/not feeling well Following commands: Intact      Cueing Cueing Techniques: Verbal cues, Gestural cues  Exercises      General Comments        Pertinent Vitals/Pain Pain Assessment Pain Assessment: No/denies pain  Home Living                          Prior Function            PT Goals (current goals can now be found in the care plan section) Acute Rehab PT Goals Patient Stated Goal: [place] and [place] PT Goal Formulation: With patient/family Time For Goal Achievement: 06/19/23 Potential to Achieve Goals: Good Progress towards PT goals: Not progressing toward goals - comment (pt with GIB and transferred to ICU)    Frequency    Min 1X/week      PT Plan      Co-evaluation              AM-PAC PT "6 Clicks" Mobility   Outcome Measure  Help needed turning from your back to your side  while in a flat bed without using bedrails?: A Little Help needed moving from lying on your back to sitting on the side of a flat bed without using bedrails?: A Lot Help needed moving to and from a bed to a chair (including a wheelchair)?: A Lot Help needed standing up from a chair using your arms (e.g., wheelchair or bedside chair)?: A Lot Help needed to walk in hospital room?: A Little Help needed climbing 3-5 steps with a railing? : A Lot 6 Click Score: 14    End of Session Equipment Utilized During Treatment: Oxygen Activity Tolerance: Treatment limited secondary to medical complications (Comment) (hypotension) Patient left: in bed;with call bell/phone within reach;with nursing/sitter in room Nurse Communication: Mobility status (RN present during PT treatment) PT Visit Diagnosis: Other abnormalities of gait and mobility (R26.89);Difficulty in walking, not elsewhere classified (R26.2)     Time: 1610-9604 PT Time Calculation (min) (ACUTE ONLY): 20 min  Charges:    $Therapeutic Activity: 8-22 mins PT General Charges $$ ACUTE PT VISIT: 1 Visit                     Lewis Shock, PT, DPT Acute Rehabilitation Services Secure chat preferred Office #: [phone]    Iona Hansen 06/10/2023, 10:23 AM

## 2023-06-10 NOTE — Progress Notes (Signed)
 Dr Gaynell Face aware of pt desire to make decisions on direction of care. Discussion deferred to Primary service along with future CTA results.

## 2023-06-10 NOTE — Procedures (Signed)
 Arterial Catheter Insertion Procedure Note  BLAIDEN WERTH  [id number]  [birth year]  Date:06/10/23  Time:3:43 PM    Provider Performing: Henrene Hawking    Procedure: Insertion of Arterial Line (21308) with US guidance (65784)   Indication(s) Blood pressure monitoring and/or need for frequent ABGs  Consent Risks of the procedure as well as the alternatives and risks of each were explained to the patient and/or caregiver.  Consent for the procedure was obtained and is signed in the bedside chart  Anesthesia None   Time Out Verified patient identification, verified procedure, site/side was marked, verified correct patient position, special equipment/implants available, medications/allergies/relevant history reviewed, required imaging and test results available.   Sterile Technique Maximal sterile technique including full sterile barrier drape, hand hygiene, sterile gown, sterile gloves, mask, hair covering, sterile ultrasound probe cover (if used).   Procedure Description Area of catheter insertion was cleaned with chlorhexidine and draped in sterile fashion. With real-time ultrasound guidance an arterial catheter was placed into the right radial artery.  Appropriate arterial tracings confirmed on monitor.     Complications/Tolerance None; patient tolerated the procedure well.   EBL Minimal   Specimen(s) None   Hazel Sams AGACNP-BC   [facility] 06/10/2023, 3:44 PM  Please see [email or web address] for pager details.  From 7A-7P if no response, please call [phone]. After hours, please call ELink [phone].

## 2023-06-10 NOTE — Progress Notes (Signed)
 RT X2 attempted x2 to place a-line. RT was unsuccessful. CCM notified.

## 2023-06-10 NOTE — TOC Progression Note (Signed)
 Transition of Care [facility]) - Progression Note    Patient Details  Name: [name] [name] MRN: [id number] Date of Birth: Jan 03, 1946  Transition of Care [facility]) CM/SW Contact  Delilah Shan, LCSWA Phone Number: 06/10/2023, 3:50 PM  Clinical Narrative:     CSW spoke with patient to provide SNF bed offers. Patient requested for CSW to call his daughter Selena Batten regarding SNF choice. CSW LVM for patients daughter Selena Batten. CSW awaiting call back. CSW will continue to follow.   Expected Discharge Plan: Skilled Nursing Facility Barriers to Discharge: Continued Medical Work up  Expected Discharge Plan and Services In-house Referral: Clinical Social Work Discharge Planning Services: CM Consult Post Acute Care Choice: Home Health Living arrangements for the past 2 months: Single Family Home                                       Social Determinants of Health (SDOH) Interventions SDOH Screenings   Food Insecurity: No Food Insecurity (05/28/2023)  Housing: Low Risk  (05/31/2023)  Transportation Needs: No Transportation Needs (05/28/2023)  Utilities: Not At Risk (05/28/2023)  Alcohol Screen: Low Risk  (10/31/2022)  Depression (PHQ2-9): Low Risk  (04/07/2023)  Financial Resource Strain: Low Risk  (10/31/2022)  Physical Activity: Insufficiently Active (10/31/2022)  Social Connections: Socially Integrated (05/28/2023)  Stress: No Stress Concern Present (10/31/2022)  Tobacco Use: Low Risk  (06/03/2023)  Health Literacy: Adequate Health Literacy (10/31/2022)    Readmission Risk Interventions     No data to display

## 2023-06-10 NOTE — Progress Notes (Signed)
 [facility] ROUNDING NOTE   Subjective: Complains of left lower quadrant discomfort, had a bowel movement with dark maroon stool. Hemoglobin stable   Objective: Vital signs in last 24 hours: Temp:  [96.1 F (35.6 C)-97.8 F (36.6 C)] 97.1 F (36.2 C) (03/04 0330) Pulse Rate:  [96-227] 105 (03/04 1515) Resp:  [15-32] 25 (03/04 1515) BP: (44-156)/(26-123) 116/90 (03/04 1515) SpO2:  [71 %-100 %] 100 % (03/04 1515) Weight:  [89.5 kg] 89.5 kg (03/04 0530) Last BM Date : 06/10/23 General: NAD Abdomen: Soft, mild left lower quadrant tenderness, no distention     Intake/Output from previous day: 03/03 0701 - 03/04 0700 In: 4700.1 [P.O.:240; I.V.:2123.7; Blood:1022.5; IV Piggyback:1313.9] Out: 1600 [Urine:1100; Stool:500] Intake/Output this shift: Total I/O In: 1246.7 [I.V.:1246.7] Out: 240 [Urine:240]   Lab Results: Recent Labs    06/09/23 2344 06/10/23 0514 06/10/23 1018  WBC 21.4* 24.6* 25.9*  HGB 9.5* 10.7* 9.5*  PLT 393 363 341  MCV 90.6 87.3 87.1   BMET Recent Labs    06/08/23 0328 06/09/23 1730 06/10/23 0514  NA 134* 133* 131*  K 3.9 5.2* 4.9  CL 104 102 103  CO2 19* 17* 14*  GLUCOSE 105* 267* 240*  BUN 32* 42* 56*  CREATININE 0.91 1.80* 2.57*  CALCIUM 8.7* 7.6* 7.1*   LFT Recent Labs    06/09/23 1730  PROT 5.1*  ALBUMIN 2.5*  AST 25  ALT 25  ALKPHOS 52  BILITOT 0.8   PT/INR Recent Labs    06/09/23 1730  INR 3.4*      Imaging/Other results: No results found.    Assessment &Plan  78 year old male with history of severe mitral and tricuspid regurgitation status post mitral valve and tricuspid valve repair, maze with acute lower GI hemorrhage concerning for possible diverticular bleed, ischemic colitis also in the differential  AKI in the setting of acute GI bleed, holding off CTA for now Continue supportive care Hemoglobin is relatively stable Hold anticoagulation Continue Zosyn, has worsening leukocytosis cannot  exclude acute diverticulitis possible triggering diverticular bleed Continue clear liquid diet for today, if no further bleeding can advance to soft diet tomorrow  GI will continue to follow along    K. Scherry Ran , MD [phone]  [facility]

## 2023-06-10 NOTE — Progress Notes (Signed)
      [address]       [place] [address]             [phone]    Post MVR/TVR/Maze, LAA clip 2/18   Relatively stable day  No further bloody stools  BP (!) 110/91   Pulse (!) 128   Temp 97.8 F (36.6 C) (Oral)   Resp 19   Ht 5\' 10"  (1.778 m)   Wt 89.5 kg   SpO2 99%   BMI 28.31 kg/m  Afib rate 110-130 on amiodarone Norepi down to 14  Intake/Output Summary (Last 24 hours) at 06/10/2023 1909 Last data filed at 06/10/2023 1900 Gross per 24 hour  Intake 4311.36 ml  Output 895 ml  Net 3416.36 ml    Hgb 9.5 A fib rate up will rebolus with amiodarone  Viviann Spare C. Dorris Fetch, MD [facility] [phone]

## 2023-06-10 NOTE — Progress Notes (Addendum)
 Pt requesting to change code status. PT wants to discuss it with his family in am. Then make decision based on how he is feeling. Pt would like results of CTA to help decide desires on direction of care. Elink notified.

## 2023-06-10 NOTE — Progress Notes (Signed)
 Pharmacy Antibiotic Note  [name] [name] is a 78 y.o. male admitted on 05/27/2023 for MR/TR repairs and Maze for Afib. S/p started apixaban post op > GI bleed and now concern for   intra-abdominal infection .  WBC 26 hypothermic,AKI Cr 1> 2.5 with hypotension insetting of bleed.  Pharmacy has been consulted for Zosyn dosing.  Plan: Zosyn 3.375gm IV q8h EI  Height: 5\' 10"  (177.8 cm) Weight: 89.5 kg (197 lb 5 oz) IBW/kg (Calculated) : 73  Temp (24hrs), Avg:97.2 F (36.2 C), Min:96.1 F (35.6 C), Max:97.8 F (36.6 C)  Recent Labs  Lab 06/06/23 0329 06/07/23 0336 06/08/23 0328 06/09/23 1225 06/09/23 1730 06/09/23 2344 06/10/23 0514 06/10/23 1018  WBC 15.9* 16.8* 15.8* 16.7* 21.3* 21.4* 24.6* 25.9*  CREATININE 0.91 0.87 0.91  --  1.80*  --  2.57*  --     Estimated Creatinine Clearance: 25.8 mL/min (A) (by C-G formula based on SCr of 2.57 mg/dL (H)).    No Known Allergies  Antimicrobials this admission:  Dose adjustments this admission:   Microbiology results:   Leota Sauers Pharm.D. CPP, BCPS Clinical Pharmacist [phone] 06/10/2023 12:35 PM

## 2023-06-10 NOTE — Progress Notes (Addendum)
 NAME:  [name] [name], MRN:  [id number], DOB:  Nov 14, 1945, LOS: 14 ADMISSION DATE:  05/27/2023, CONSULTATION DATE:  2/18 REFERRING MD:  Leafy Ro, CHIEF COMPLAINT:  post op critical care support    History of Present Illness:  78 year old male w/ medical hx as outlined below. Followed by both cards and CVTS for mod/svr MR w/ progressive fatigue. Presented to [facility] 2/18 for elective MR/TR and MAZE Intra-op echo 45-50% EF, LV global hypokinesis, mildly elevated PAS,  RV mildly enlarged.  Bypass started 0852 ended 1118  cell saver  To CVICU post op  PCCM asked to assist w/ post op care   Re-consulted on 3/3 for Afib RVR and Acute GI bleed.    Pertinent  Medical History  Severe MR, mod TR, HFmrEF (EF 55%), Dysrhythmia, HL, PAF s/p ablation 2012 (on xarelto), Prior CVA,chronic UTIs, prostate cancer s/p prostatectomy  Significant Hospital Events: Including procedures, antibiotic start and stop dates in addition to other pertinent events    2/18 MAZE, MV repair, TV repair, extubated overnight 2/25 cardioversion, back in bradycardic rhythm requiring pacing. PPM placed. 3/3 Originally transferred out of ICU, re-consult due to GI bleed and Afib RVR on amio gtt and levophed 3/4 CVC placed yesterday afternoon for worsening shock increase pressor need, 3 units of PRBCs given.    Interim History / Subjective:  Patient alert, following commands, pleasant, still complaining of left flank pain  Abdomen soft but distended, on levophed and amio   Objective   Blood pressure 92/76, pulse (!) 111, temperature (!) 97.1 F (36.2 C), temperature source Axillary, resp. rate 19, height 5\' 10"  (1.778 m), weight 89.5 kg, SpO2 98%. CVP:  [2 mmHg-18 mmHg] 14 mmHg      Intake/Output Summary (Last 24 hours) at 06/10/2023 0820 Last data filed at 06/10/2023 0400 Gross per 24 hour  Intake 4311.66 ml  Output 900 ml  Net 3411.66 ml   Filed Weights   06/08/23 0316 06/09/23 0341 06/10/23 0530  Weight: 85.2 kg 84.5  kg 89.5 kg    Examination: General: acute on chronic older adult male lying in ICU bed HEENT: Normocephalic, PERRLA intact, pale dry MM CV: s1,s2, irregular-afib controlled on amio gtt, no MRG, no JVD Pulm: clear, diminished, no distress Abs: BS hyperactive, 2 bloody BMs reported overnight, lower quadrant to lower flank abdominal pain- soft upon palpation, no increase in pain upon palpation.  Skin: pale, no rash, lesion  Neuro: Rass 0, follows commands, AOx4 GU: male purewick in place, intact    Resolved Hospital Problem list   Consumptive thrombocytopenia- expected post-op Acute kidney injury  Assessment & Plan:  Acute GI bleed, hemorrhagic shock Patient transferred from progressive, 2 medium to large Bright red bloody BM per RN staff  Was eliquis for afib  Received 3 units of PRBCs Received Kcentra for eliquis reversal  2 bloody BMs overnight Hgb 10.7 P: Continue serial CBCs q4hrs Monitor for active signs of bleeding  GI following appreciate assistance  Discussed with GI in regards to CT angio in setting of AKI Since patient is not currently not actively bleeding-imaging would not be helpful at this time. GI recommending to hold off. Notified beside RN and patient of this.  Continue MAP goal >65, continue to titrate levophed  Continue protonix IV BID   Leukocytosis in setting of Acute GI bleed Originally contributed to reactivity but now significant increase from left abdominal/flank  Concern for ischemic colitis  WBC increase from 16.7 to 24.6 P: Will place  on empiric coverage for ischemic colitis- pharmacy consult for zosyn Continue to trend fever curve  GI consulted and aware of worsening leukocytosis   AKI Hypervolemic hyponatremia Worsening renal function secondary to hypotension from shock  Cr 2.57 from 1.80 << 0.91  P:  Hold diuretics in setting of shock Continue to trend renal function daily  Continue to monitor and optimize electrolytes daily Continue  to monitor urine output, place foley  Continue strict I/Os Continue Adequate renal perfusion, MAp>65  Avoid nephrotoxic agents   Severe mitral regurgitation and tricuspid regurgitation status post MVR and TV repair Persistent atrial fibrillation status post MAZE on 2/18 Acute HFrEF Sinus node dysfunction with tachy-brady syndrome s/p single chamber PPM (2/25)  Patient originally receiving 400mg  P: TCTs primary, appreciate assistance Continue amio gtt per TCTs Continue to hold metoprolol and spironolactone in setting of shock  Continue to hold additional diuretics Resume GDMT when medically appropriate  Hold statin  Prediabetes with hyperglycemia P: Patient currently NPO Continue SSI  CBG goal 140 to 180 Place on sensitive SSI, CBG q4      Best Practice (right click and "Reselect all SmartList Selections" daily)   Diet/type: Regular consistency (see orders) DVT prophylaxis  on hold  Pressure ulcer(s): N/A GI prophylaxis: PPI Lines: N/A Foley:  N/A Code Status:  full code Last date of multidisciplinary goals of care discussion [updated patient at beside 3/4]    CC: 45 mins  Christian Quan Cybulski AGACNP-BC   [facility] 06/10/2023, 8:20 AM  Please see [email or web address] for pager details.  From 7A-7P if no response, please call [phone]. After hours, please call ELink [phone].

## 2023-06-10 NOTE — Progress Notes (Signed)
 7 Days Post-Op Procedure(s) (LRB): PACEMAKER IMPLANT (N/A) Subjective: Feels better this am compared to last night. Has had some intermittent LLQ pain which he had yesterday when this bleeding started. Some nausea this am after coughing.  Transfused 3 units of PRBC's since last night to keep Hgb fairly stable. Had 3 bloody stools overnight.   Received Kcentra for reversal of Eliquis with active GI bleed.  Objective: Vital signs in last 24 hours: Temp:  [96.1 F (35.6 C)-97.8 F (36.6 C)] 97.1 F (36.2 C) (03/04 0330) Pulse Rate:  [96-227] 114 (03/04 0530) Cardiac Rhythm: Sinus tachycardia;Atrial fibrillation (03/03 1900) Resp:  [11-32] 18 (03/04 0530) BP: (50-150)/(30-123) 109/84 (03/04 0530) SpO2:  [71 %-100 %] 84 % (03/04 0530) Weight:  [89.5 kg] 89.5 kg (03/04 0530)  Hemodynamic parameters for last 24 hours: CVP:  [2 mmHg-18 mmHg] 11 mmHg  Intake/Output from previous day: 03/03 0701 - 03/04 0700 In: 4311.7 [P.O.:240; I.V.:1735.3; Blood:1022.5; IV Piggyback:1313.9] Out: 1600 [Urine:1100; Stool:500] Intake/Output this shift: No intake/output data recorded.  General appearance: alert and cooperative Neurologic: intact Heart: regular rate and rhythm, tachy Lungs: clear to auscultation bilaterally Abdomen: soft, non-tender; bowel sounds present. He says belly feels about normal size to him. Extremities: no edema Wound: incision healing well.  Lab Results: Recent Labs    06/09/23 2344 06/10/23 0514  WBC 21.4* 24.6*  HGB 9.5* 10.7*  HCT 28.8* 30.9*  PLT 393 363   BMET:  Recent Labs    06/09/23 1730 06/10/23 0514  NA 133* 131*  K 5.2* 4.9  CL 102 103  CO2 17* 14*  GLUCOSE 267* 240*  BUN 42* 56*  CREATININE 1.80* 2.57*  CALCIUM 7.6* 7.1*    PT/INR:  Recent Labs    06/09/23 1730  LABPROT 34.6*  INR 3.4*   ABG    Component Value Date/Time   PHART 7.510 (H) 06/01/2023 2230   HCO3 32.8 (H) 06/01/2023 2230   TCO2 34 (H) 06/01/2023 2230   ACIDBASEDEF  5.0 (H) 05/27/2023 1859   O2SAT 94 06/01/2023 2230   CBG (last 3)  Recent Labs    06/09/23 2347 06/10/23 0516 06/10/23 0520  GLUCAP 341* 34* 244*    Assessment/Plan:  S/p MV repair, TV repair, MAZE/clip 05/27/23.  Hemodynamics stable this am on NE 11. Wean as tolerated.  Rhythm has been AF with RVR but looks regular and tachy this am. Will check ECG. Continues on IV amio.  GI bleeding, suspect lower. GI has seen him and plans CTA abdomen but I would not give him IV contrast with AKI. Continue to follow Hgb.  AKI with creat up to 2.57 due to hypotension and volume depletion yesterday from GI bleed and diuretic. Volume repleted. Will insert foley to monitor UO. Avoid nephrotoxic agents and hypotension.  Leukocytosis. ? Ischemic colitis. Will see what CCM thinks about empiric antibiotics.  Further plans per CCM, GI. Dr. Leafy Ro on vacation and will be back tomorrow.   LOS: 14 days    Alleen Borne 06/10/2023

## 2023-06-10 NOTE — Plan of Care (Signed)
  Problem: Clinical Measurements: Goal: Respiratory complications will improve Outcome: Progressing Goal: Cardiovascular complication will be avoided Outcome: Progressing   Problem: Pain Managment: Goal: General experience of comfort will improve and/or be controlled Outcome: Progressing   Problem: Activity: Goal: Risk for activity intolerance will decrease Outcome: Not Progressing

## 2023-06-11 ENCOUNTER — Inpatient Hospital Stay (HOSPITAL_COMMUNITY)

## 2023-06-11 DIAGNOSIS — K921 Melena: Secondary | ICD-10-CM

## 2023-06-11 DIAGNOSIS — I4819 Other persistent atrial fibrillation: Secondary | ICD-10-CM

## 2023-06-11 DIAGNOSIS — R578 Other shock: Secondary | ICD-10-CM

## 2023-06-11 DIAGNOSIS — I4891 Unspecified atrial fibrillation: Secondary | ICD-10-CM | POA: Diagnosis not present

## 2023-06-11 DIAGNOSIS — K922 Gastrointestinal hemorrhage, unspecified: Secondary | ICD-10-CM

## 2023-06-11 DIAGNOSIS — E8722 Chronic metabolic acidosis: Secondary | ICD-10-CM

## 2023-06-11 DIAGNOSIS — N179 Acute kidney failure, unspecified: Secondary | ICD-10-CM | POA: Diagnosis not present

## 2023-06-11 DIAGNOSIS — D72829 Elevated white blood cell count, unspecified: Secondary | ICD-10-CM | POA: Diagnosis not present

## 2023-06-11 LAB — COMPREHENSIVE METABOLIC PANEL
ALT: 18 U/L (ref 0–44)
AST: 17 U/L (ref 15–41)
Albumin: 2.4 g/dL — ABNORMAL LOW (ref 3.5–5.0)
Alkaline Phosphatase: 47 U/L (ref 38–126)
Anion gap: 14 (ref 5–15)
BUN: 62 mg/dL — ABNORMAL HIGH (ref 8–23)
CO2: 15 mmol/L — ABNORMAL LOW (ref 22–32)
Calcium: 7.2 mg/dL — ABNORMAL LOW (ref 8.9–10.3)
Chloride: 100 mmol/L (ref 98–111)
Creatinine, Ser: 2.42 mg/dL — ABNORMAL HIGH (ref 0.61–1.24)
GFR, Estimated: 26 mL/min — ABNORMAL LOW (ref >60)
Glucose, Bld: 133 mg/dL — ABNORMAL HIGH (ref 70–99)
Potassium: 4 mmol/L (ref 3.5–5.1)
Sodium: 129 mmol/L — ABNORMAL LOW (ref 135–145)
Total Bilirubin: 0.3 mg/dL (ref 0.0–1.2)
Total Protein: 4.7 g/dL — ABNORMAL LOW (ref 6.5–8.1)

## 2023-06-11 LAB — GLUCOSE, CAPILLARY
Glucose-Capillary: 97 mg/dL (ref 70–99)
Glucose-Capillary: 120 mg/dL — ABNORMAL HIGH (ref 70–99)
Glucose-Capillary: 110 mg/dL — ABNORMAL HIGH (ref 70–99)
Glucose-Capillary: 107 mg/dL — ABNORMAL HIGH (ref 70–99)
Glucose-Capillary: 75 mg/dL (ref 70–99)
Glucose-Capillary: 81 mg/dL (ref 70–99)

## 2023-06-11 LAB — CBC
HCT: 23.3 % — ABNORMAL LOW (ref 39.0–52.0)
HCT: 23.9 % — ABNORMAL LOW (ref 39.0–52.0)
Hemoglobin: 8.1 g/dL — ABNORMAL LOW (ref 13.0–17.0)
Hemoglobin: 8.4 g/dL — ABNORMAL LOW (ref 13.0–17.0)
MCH: 29.6 pg (ref 26.0–34.0)
MCH: 29.6 pg (ref 26.0–34.0)
MCHC: 34.8 g/dL (ref 30.0–36.0)
MCHC: 35.1 g/dL (ref 30.0–36.0)
MCV: 85 fL (ref 80.0–100.0)
MCV: 84.2 fL (ref 80.0–100.0)
Platelets: 285 10*3/uL (ref 150–400)
Platelets: 232 10*3/uL (ref 150–400)
RBC: 2.74 MIL/uL — ABNORMAL LOW (ref 4.22–5.81)
RBC: 2.84 MIL/uL — ABNORMAL LOW (ref 4.22–5.81)
RDW: 15.5 % (ref 11.5–15.5)
RDW: 15.7 % — ABNORMAL HIGH (ref 11.5–15.5)
WBC: 30.3 10*3/uL — ABNORMAL HIGH (ref 4.0–10.5)
WBC: 22.2 10*3/uL — ABNORMAL HIGH (ref 4.0–10.5)
nRBC: 0.5 % — ABNORMAL HIGH (ref 0.0–0.2)
nRBC: 1.1 % — ABNORMAL HIGH (ref 0.0–0.2)

## 2023-06-11 LAB — PREPARE RBC (CROSSMATCH)

## 2023-06-11 LAB — MAGNESIUM: Magnesium: 2 mg/dL (ref 1.7–2.4)

## 2023-06-11 LAB — LACTIC ACID, PLASMA: Lactic Acid, Venous: 1.1 mmol/L (ref 0.5–1.9)

## 2023-06-11 LAB — SODIUM, URINE, RANDOM: Sodium, Ur: 40 mmol/L

## 2023-06-11 LAB — OSMOLALITY, URINE: Osmolality, Ur: 510 mosm/kg (ref 300–900)

## 2023-06-11 LAB — BRAIN NATRIURETIC PEPTIDE: B Natriuretic Peptide: 90.5 pg/mL (ref 0.0–100.0)

## 2023-06-11 LAB — PHOSPHORUS: Phosphorus: 4.5 mg/dL (ref 2.5–4.6)

## 2023-06-11 LAB — PROCALCITONIN: Procalcitonin: 0.25 ng/mL

## 2023-06-11 MED ORDER — INSULIN ASPART 100 UNIT/ML IJ SOLN
0.0000 [IU] | Freq: Three times a day (TID) | INTRAMUSCULAR | Status: DC
  Administered 2023-06-15: 2 [IU] via SUBCUTANEOUS

## 2023-06-11 MED ORDER — SODIUM CHLORIDE 0.9% IV SOLUTION: Freq: Once | INTRAVENOUS | Status: DC

## 2023-06-11 MED ORDER — SODIUM CHLORIDE 0.9% IV SOLUTION
Freq: Once | INTRAVENOUS | Status: DC
Start: 2023-06-11 — End: 2023-06-18

## 2023-06-11 NOTE — Progress Notes (Signed)
 Occupational Therapy Treatment Patient Details Name: [name] [name] MRN: [id number] DOB: [birth year] Today's Date: 06/11/2023   History of present illness 78 y/o male admitted 2/18 for mitral and tricuspid valve repair, MAZE same date. 2/25 DCCV, PPM placed.  3/3 pt transferred to ICU due to hypotension, lower GI bleeding, and hemorrhagic shock. PMH: Mitral regurgitation, paroxysmal Afib, CVA, HTN, HLD, Prostate CA, glaucoma   OT comments  OT session focused on training in techniques for increased safety and independence with toileting task from bed level and bed mobility during ADLs while adhering to precautions, and in techniques for improved positioning in the bed, including benefits of moving into chair position and improved positioning for B UE with pillows to prevent edema. Pt presents with lethargy and significant fatigue this session secondary to current medical condition. Pt requiring Total assist +2 for clothing management and peri care and Max assist +1 to Total assist +2 for rolling L/R in the bed while adhering to sternal and ICD/pacemaker precautions. Pt made good attempt to participate throughout session. Pt not making progress toward goals at this time due to current medical condition (ongoing red/black, liquid bloody stools). Pt is expected to make good progress toward goals once medical condition stabilizes. Pt will benefit from continued acute skilled OT services to address deficits outlined below and increase safety and independence with functional tasks. Post acute discharge, pt will benefit from intensive inpatient skilled rehab services < 3 hours per day to maximize rehab potential.       If plan is discharge home, recommend the following:  Two people to help with walking and/or transfers;A lot of help with walking and/or transfers;Two people to help with bathing/dressing/bathroom;A lot of help with bathing/dressing/bathroom;Assistance with cooking/housework;Assist for  transportation;Help with stairs or ramp for entrance   Equipment Recommendations  BSC/3in1;Other (comment) (Other TBD)    Recommendations for Other Services      Precautions / Restrictions Precautions Precautions: Sternal;Fall;ICD/Pacemaker (urinary incontinence) Precaution Booklet Issued: Yes (comment) Recall of Precautions/Restrictions: Impaired Precaution/Restrictions Comments: provided PPM precaution progression handout Restrictions Weight Bearing Restrictions Per Provider Order: Yes RUE Weight Bearing Per Provider Order: Partial weight bearing LUE Weight Bearing Per Provider Order: Partial weight bearing Other Position/Activity Restrictions: sternal precautions/ICD/pacemaker precautions       Mobility Bed Mobility Overal bed mobility: Needs Assistance Bed Mobility: Rolling Rolling: Max assist, Total assist, +2 for physical assistance, +2 for safety/equipment         General bed mobility comments: Pt rolled L/R in the bed x4 to place bedpan and then for pericare. Pt initally Max assist +1 to roll L/R then Total assist +2 for safety as session progressed due to significant fatigue and lethargy secondary to current medical status.    Transfers                   General transfer comment: deferred this session for pt/therapist safety due to pt presenting with significant lethargy and fatigue following rolling L/R in the bed x4 to place bedpan and then for pericare     Balance                                           ADL either performed or assessed with clinical judgement   ADL Overall ADL's : Needs assistance/impaired  Toileting- Clothing Manipulation and Hygiene: Total assistance;+2 for physical assistance;+2 for safety/equipment;Bed level         General ADL Comments: Pt with significantly decreased activity tolerance and lethargy throughout session    Extremity/Trunk Assessment Upper Extremity  Assessment Upper Extremity Assessment: Right hand dominant;Generalized weakness;LUE deficits/detail LUE Deficits / Details: pacemaker precautions, full AROM elbow to hand   Lower Extremity Assessment Lower Extremity Assessment: Defer to PT evaluation        Vision       Perception     Praxis     Communication Communication Communication: No apparent difficulties Factors Affecting Communication: Other (comment) (soft voice)   Cognition Arousal: Lethargic Behavior During Therapy: WFL for tasks assessed/performed Cognition: No apparent impairments             OT - Cognition Comments: Pt lethargic and keeping eyes closed most of the session, but oriented x4 and pleasant throughout session with pt thanking OT and RN multiple times during session for assistance                 Following commands: Intact        Cueing   Cueing Techniques: Verbal cues, Tactile cues  Exercises      Shoulder Instructions       General Comments [name] HR in the low 100s to 120s and O2 sat >/96% on 2L continuous O2 through nasal cannula throughout session. RN present and assisting throughout session.    Pertinent Vitals/ Pain       Pain Assessment Pain Assessment: Faces Faces Pain Scale: Hurts little more Pain Location: incision; lower abdomen Pain Descriptors / Indicators: Aching, Discomfort, Grimacing Pain Intervention(s): Limited activity within patient's tolerance, Monitored during session, Repositioned  Home Living                                          Prior Functioning/Environment              Frequency  Min 2X/week        Progress Toward Goals  OT Goals(current goals can now be found in the care plan section)  Progress towards OT goals: Not progressing toward goals - comment (Pt not progressing due to current medical condition (GI bleed). Pt expected to make good progress once medical condition stabilizes.)     Plan       Co-evaluation                 AM-PAC OT "6 Clicks" Daily Activity     Outcome Measure   Help from another person eating meals?: A Little (currently on clear liquid diet) Help from another person taking care of personal grooming?: A Little Help from another person toileting, which includes using toliet, bedpan, or urinal?: Total Help from another person bathing (including washing, rinsing, drying)?: A Lot Help from another person to put on and taking off regular upper body clothing?: A Lot Help from another person to put on and taking off regular lower body clothing?: Total 6 Click Score: 12    End of Session Equipment Utilized During Treatment: Oxygen  OT Visit Diagnosis: Muscle weakness (generalized) (M62.81);Other (comment) (decreased activity tolerance)   Activity Tolerance Patient limited by fatigue;Patient limited by lethargy;Treatment limited secondary to medical complications (Comment) (Limted by medical status (red/black liquid stool during session))   Patient Left in bed;with call bell/phone within reach;with nursing/sitter in  room   Nurse Communication Other (comment) (RN present and assisting throughout session)        Time: [phone] OT Time Calculation (min): 24 min  Charges: OT General Charges $OT Visit: 1 Visit OT Treatments $Self Care/Home Management : 8-22 mins $Therapeutic Activity: 8-22 mins  Severiano Utsey "Orson Eva., OTR/L, MA Acute Rehab [phone]  Lendon Colonel 06/11/2023, 5:25 PM

## 2023-06-11 NOTE — TOC Progression Note (Addendum)
 Transition of Care [facility]) - Progression Note    Patient Details  Name: [name] [name] MRN: [id number] Date of Birth: 1946/03/21  Transition of Care [facility] CM/SW Contact  Nicanor Bake Phone Number: [phone] 06/11/2023, 9:58 AM  Clinical Narrative:  9:39 AM- HF CSW called and spoke with pts daughter, Selena Batten. Selena Batten stated that they have had a change of heart about some of the facilities they were previously interested in. Selena Batten stated that they do not want the pt at [facility], [facility], or [facility]. Selena Batten stated based off of the accepted list they are open to [facility]. However they would like to inquire about [facility] as a first choice. CSW faxed out and added [facility] to SNF list. Offer pending.   3:48 PM- HF CSW called [facility] to follow up about pending bed offer. CSW spoke with Clara who stated that they do not have any bed availability at this time.   3:50 PM- HF CSW updated Selena Batten about [facility]. At this time she is ok with [facility]. Once pt is closer to being medically ready for dc she would like to inquire about [facility] bed availability before making final decision.   TOC will continue following.      Expected Discharge Plan: Skilled Nursing Facility Barriers to Discharge: Continued Medical Work up  Expected Discharge Plan and Services In-house Referral: Clinical Social Work Discharge Planning Services: CM Consult Post Acute Care Choice: Home Health Living arrangements for the past 2 months: Single Family Home                                       Social Determinants of Health (SDOH) Interventions SDOH Screenings   Food Insecurity: No Food Insecurity (05/28/2023)  Housing: Low Risk  (05/31/2023)  Transportation Needs: No Transportation Needs (05/28/2023)  Utilities: Not At Risk (05/28/2023)  Alcohol Screen: Low Risk  (10/31/2022)  Depression (PHQ2-9): Low Risk  (04/07/2023)  Financial Resource Strain: Low Risk   (10/31/2022)  Physical Activity: Insufficiently Active (10/31/2022)  Social Connections: Socially Integrated (05/28/2023)  Stress: No Stress Concern Present (10/31/2022)  Tobacco Use: Low Risk  (06/03/2023)  Health Literacy: Adequate Health Literacy (10/31/2022)    Readmission Risk Interventions     No data to display

## 2023-06-11 NOTE — Plan of Care (Signed)

## 2023-06-11 NOTE — Progress Notes (Addendum)
 Progress Note   LOS: 15 days   Chief Complaint: Possible diverticular bleed   Subjective   Patient states his bleeding has improved significantly.  He had a bowel movement this morning with little to no blood.  This was confirmed with RN.  Denies nausea, vomiting, abdominal pain.  Overall feels he is improving.   Objective   Vital signs in last 24 hours: Temp:  [97.8 F (36.6 C)-98.8 F (37.1 C)] 98 F (36.7 C) (03/05 0900) Pulse Rate:  [104-176] 132 (03/05 0900) Resp:  [14-27] 19 (03/05 0915) BP: (58-131)/(26-107) 102/67 (03/05 0915) SpO2:  [83 %-100 %] 99 % (03/05 0815) Arterial Line BP: (80-139)/(59-88) 114/65 (03/05 0915) Weight:  [89.1 kg] 89.1 kg (03/05 0600) Last BM Date : 06/10/23 Last BM recorded by nurses in past 5 days Stool Type: Type 7 (Liquid consistency with no solid pieces) (06/11/2023  6:00 AM)  General:   male in no acute distress Heart:  Regular rate and rhythm; no murmurs Pulm: Clear anteriorly; no wheezing Abdomen: soft, nondistended, normal bowel sounds in all quadrants. Nontender without guarding. No organomegaly appreciated. Extremities:  No edema Neurologic:  Alert and  oriented x4;  No focal deficits.  Psych:  Cooperative. Normal mood and affect.  Intake/Output from previous day: 03/04 0701 - 03/05 0700 In: 3715.9 [P.O.:720; I.V.:2840.8; IV Piggyback:155.1] Out: 1005 [Urine:1005] Intake/Output this shift: Total I/O In: -  Out: 200 [Urine:200]  Studies/Results: No results found.  Lab Results: Recent Labs    06/10/23 0514 06/10/23 1018 06/11/23 0551  WBC 24.6* 25.9* 30.3*  HGB 10.7* 9.5* 8.1*  HCT 30.9* 27.8* 23.3*  PLT 363 341 285   BMET Recent Labs    06/09/23 1730 06/10/23 0514 06/11/23 0551  NA 133* 131* 129*  K 5.2* 4.9 4.0  CL 102 103 100  CO2 17* 14* 15*  GLUCOSE 267* 240* 133*  BUN 42* 56* 62*  CREATININE 1.80* 2.57* 2.42*  CALCIUM 7.6* 7.1* 7.2*   LFT Recent Labs    06/11/23 0551  PROT 4.7*  ALBUMIN 2.4*   AST 17  ALT 18  ALKPHOS 47  BILITOT 0.3   PT/INR Recent Labs    06/09/23 1730  LABPROT 34.6*  INR 3.4*     Scheduled Meds:  sodium chloride   Intravenous Once   sodium chloride   Intravenous Once   sodium chloride   Intravenous Once   Chlorhexidine Gluconate Cloth  6 each Topical Daily   insulin aspart  0-15 Units Subcutaneous Q4H   insulin glargine  5 Units Subcutaneous BID   magnesium gluconate  500 mg Oral Daily   pantoprazole (PROTONIX) IV  40 mg Intravenous Q12H   Continuous Infusions:  amiodarone 60 mg/hr (06/11/23 0723)   norepinephrine (LEVOPHED) Adult infusion 10 mcg/min (06/11/23 0702)   piperacillin-tazobactam (ZOSYN)  IV 3.375 g (06/11/23 1610)      Patient profile:   78 year old male with history of severe mitral and tricuspid regurgitation status post mitral valve and tricuspid valve repair, presenting with acute lower GI hemorrhage concerning for possible diverticular bleed versus ischemic colitis   Impression:   Acute GI bleed Hemorrhagic shock Leukocytosis Hgb 8.1 (down from 9.5) Worsening leukocytosis with WBC 30.3 (increased from 25.9) Improvement in bleeding and having symptom improvement is reassuring.  Continue to hold on CTA in setting of AKI.  1 unit PRBCs ordered per Dr. Nemiah Commander.  Although, currently having worsening leukocytosis, Zosyn initiated 3/4.  Differential includes diverticulitis versus ischemic colitis versus diverticular bleed  AKI in the setting of shock BUN 62, CR 2.42, GFR 26.  Stable  Persistent A-fib s/p maze On amiodarone and Levophed drips  Severe mitral regurgitation and tricuspid regurgitation s/p MVR and TV repair    Plan:   - Continue to hold CTA in the setting of AKI, though with worsening leukocytosis we will get noncontrast CT for evaluation of colitis - Continue to monitor bleeding - Continue Zosyn - Continue daily CBC and transfuse as needed to maintain HGB > 7 - Continue to hold anticoagulation -  Continue supportive care - Consider soft diet today with improvement in bleeding  Bayley M McMichael  06/11/2023, 9:30 AM   Attending physician's note   I have taken a history, reviewed the chart and examined the patient. I performed a substantive portion of this encounter, including complete performance of at least one of the key components, in conjunction with the APP. I agree with the APP's note, impression and recommendations.    He had small-volume rectal bleeding, hemoglobin trended down to 8 Hemodynamically stable, weaning off pressors Worsening leukocytosis Continue Zosyn Will obtain noncontrast CT to further evaluate, hold off contrast in the setting of AKI Continue to hold anticoagulation  Advance diet as tolerated to soft low residue diet  The patient was provided an opportunity to ask questions and all were answered. The patient agreed with the plan and demonstrated an understanding of the instructions.   Iona Beard , MD [phone]

## 2023-06-11 NOTE — Progress Notes (Addendum)
 TCTS DAILY ICU PROGRESS NOTE                   [address]            [place] [address]          [phone]   8 Days Post-Op Procedure(s) (LRB): PACEMAKER IMPLANT (N/A)  Total Length of Stay:  LOS: 15 days   Subjective: Patient states he is feeling better this am. He denies LLQ abdominal pain, nausea, or vomiting. He did have a bowel movement earlier this am and per nurse, "much less blood".  Objective: Vital signs in last 24 hours: Temp:  [97.8 F (36.6 C)-98.8 F (37.1 C)] 98.8 F (37.1 C) (03/05 0315) Pulse Rate:  [104-202] 132 (03/05 0600) Cardiac Rhythm: Atrial fibrillation (03/04 1200) Resp:  [15-32] 25 (03/05 0600) BP: (44-156)/(26-114) 111/72 (03/05 0600) SpO2:  [81 %-100 %] 100 % (03/05 0600) Arterial Line BP: (80-139)/(59-88) 112/67 (03/05 0600) Weight:  [89.1 kg] 89.1 kg (03/05 0600)  Filed Weights   06/09/23 0341 06/10/23 0530 06/11/23 0600  Weight: 84.5 kg 89.5 kg 89.1 kg    Weight change: -0.4 kg   Hemodynamic parameters for last 24 hours: CVP:  [0 mmHg-38 mmHg] 2 mmHg  Intake/Output from previous day: 03/04 0701 - 03/05 0700 In: 3715.9 [P.O.:720; I.V.:2840.8; IV Piggyback:155.1] Out: 1005 [Urine:1005]  Intake/Output this shift: Total I/O In: 1095.7 [I.V.:940.6; IV Piggyback:155.1] Out: 610 [Urine:610]  Current Meds: Scheduled Meds:  sodium chloride   Intravenous Once   sodium chloride   Intravenous Once   Chlorhexidine Gluconate Cloth  6 each Topical Daily   insulin aspart  0-15 Units Subcutaneous Q4H   insulin glargine  5 Units Subcutaneous BID   magnesium gluconate  500 mg Oral Daily   pantoprazole (PROTONIX) IV  40 mg Intravenous Q12H   Continuous Infusions:  amiodarone 60 mg/hr (06/11/23 0600)   norepinephrine (LEVOPHED) Adult infusion 10 mcg/min (06/11/23 0600)   piperacillin-tazobactam (ZOSYN)  IV 3.375 g (06/11/23 0620)   PRN Meds:.fentaNYL (SUBLIMAZE) injection, Gerhardt's butt cream, ondansetron (ZOFRAN) IV,  mouth rinse, sodium chloride  General appearance: alert and cooperative Neurologic: intact Heart: Tachycardic Lungs: Slightly diminished bibasilar breath sounds Abdomen: Soft, somewhat distended, non tender, occasional bowel sounds Extremities: SCDs Wound: Median sternotomy and PPM wounds are clean, dry, healing without signs of infection  Lab Results: CBC: Recent Labs    06/10/23 1018 06/11/23 0551  WBC 25.9* 30.3*  HGB 9.5* 8.1*  HCT 27.8* 23.3*  PLT 341 285   BMET:  Recent Labs    06/10/23 0514 06/11/23 0551  NA 131* 129*  K 4.9 4.0  CL 103 100  CO2 14* 15*  GLUCOSE 240* 133*  BUN 56* 62*  CREATININE 2.57* 2.42*  CALCIUM 7.1* 7.2*    CMET: Lab Results  Component Value Date   WBC 30.3 (H) 06/11/2023   HGB 8.1 (L) 06/11/2023   HCT 23.3 (L) 06/11/2023   PLT 285 06/11/2023   GLUCOSE 133 (H) 06/11/2023   CHOL 111 12/06/2022   TRIG 98 12/06/2022   HDL 28 (L) 12/06/2022   LDLCALC 65 12/06/2022   ALT 18 06/11/2023   AST 17 06/11/2023   NA 129 (L) 06/11/2023   K 4.0 06/11/2023   CL 100 06/11/2023   CREATININE 2.42 (H) 06/11/2023   BUN 62 (H) 06/11/2023   CO2 15 (L) 06/11/2023   TSH 1.83 04/07/2023   PSA <0.04 08/13/2021   INR 3.4 (H) 06/09/2023  HGBA1C 5.8 (H) 05/23/2023      PT/INR:  Recent Labs    06/09/23 1730  LABPROT 34.6*  INR 3.4*   Radiology: No results found.   Assessment/Plan: S/P Procedure(s) (LRB): PACEMAKER IMPLANT (N/A) 1.CV-History of persistent a fib. He has had a fib with rate not well controlled last couple of days as well as ST. On Amiodarone and Levophed drips 2. Pulmonary-on 4L of oxygen via [place]. Will wean over next few days as able. Encourage incentive spirometer 3. AKI-creatinine this am slightly decreased to 2.42. 4. GI-tolerating clear liquid diet. Per GI, may advance if no further bleeding today 5. Acute GI bleed, hemorrhagic shock-H and H this am decreased to 8.1 and 23.3. Did not have CTA as has AKI. On Protonix IV  bid. GI following. 5. Leukocytosis-WBC this am increased to 30,300.Remains afebrile. On Zosyn-? Ischemic colitis or diverticulitis 6.CBGs 152/119/97.Pre op HGA1C 5.8.On low dose Insulin 7. Deconditioned-continue PT/OT Appreciate CCM and GI's assistance  Donielle Margaretann Loveless PA-C 06/11/2023 6:53 AM   Chart reviewed, patient examined, agree with above.   Remains on NE 9 to support BP. Rhythm looks like atrial flutter to me at 130 on IV amio. Can't use cardizem due to vasopressor requirement. I think his BP may be better in sinus but can't cardiovert him off anticoagulation and can't anticoagulate with GI bleeding.   No abdominal pain. Had stool overnight with some blood but less. His Hgb dropped to 8.1 from 9.5 yesterday am. Some of that may be dilution since he was 2700 cc positive yesterday. Will transfuse 1 unit of PRBC's this am since he is still NE to support BP.  Leukocytosis increased to 30.3. On empiric antibiotics. His belly is benign at this time.

## 2023-06-11 NOTE — Progress Notes (Signed)
 NAME:  [name] [name], MRN:  [id number], DOB:  Mar 31, 1946, LOS: 15 ADMISSION DATE:  05/27/2023, CONSULTATION DATE:  2/18 REFERRING MD:  Leafy Ro, CHIEF COMPLAINT:  post op critical care support    History of Present Illness:  78 year old male w/ medical hx as outlined below. Followed by both cards and CVTS for mod/svr MR w/ progressive fatigue. Presented to [facility] 2/18 for elective MR/TR and MAZE Intra-op echo 45-50% EF, LV global hypokinesis, mildly elevated PAS,  RV mildly enlarged.  Bypass started 0852 ended 1118  cell saver  To CVICU post op  PCCM asked to assist w/ post op care   Re-consulted on 3/3 for Afib RVR and Acute GI bleed.    Pertinent  Medical History  Severe MR, mod TR, HFmrEF (EF 55%), Dysrhythmia, HL, PAF s/p ablation 2012 (on xarelto), Prior CVA,chronic UTIs, prostate cancer s/p prostatectomy  Significant Hospital Events: Including procedures, antibiotic start and stop dates in addition to other pertinent events    2/18 MAZE, MV repair, TV repair, extubated overnight 2/25 cardioversion, back in bradycardic rhythm requiring pacing. PPM placed. 3/3 Originally transferred out of ICU, re-consult due to GI bleed (felt either diverticular or ischemic colitis) and Afib RVR on amio gtt and levophed.  Started on Zosyn 3/4 CVC placed yesterday afternoon for worsening shock increase pressor need, 3 units of PRBCs given. 3/5: Remains on norepinephrine infusion, hemoglobin down over 1 g again, received 1 unit of blood still on PPI BID   Interim History / Subjective:  No pain.  No shortness of breath.  Still on pressors awaiting CT scan  Objective   Blood pressure 92/70, pulse (!) 131, temperature 98.2 F (36.8 C), temperature source Oral, resp. rate (!) 25, height 5\' 10"  (1.778 m), weight 89.1 kg, SpO2 99%. CVP:  [0 mmHg-17 mmHg] 2 mmHg      Intake/Output Summary (Last 24 hours) at 06/11/2023 1058 Last data filed at 06/11/2023 0800 Gross per 24 hour  Intake 3335.25 ml   Output 1025 ml  Net 2310.25 ml   Filed Weights   06/09/23 0341 06/10/23 0530 06/11/23 0600  Weight: 84.5 kg 89.5 kg 89.1 kg    Examination: General 78 year old male patient lying in bed currently no acute distress HEENT normocephalic atraumatic no jugular venous distention is appreciated mucous membranes are moist Pulmonary: Clear to auscultation Cardiac: Atrial fibrillation with RVR no murmur rub or gallop surgical site unremarkable Abdomen soft not tender no organomegaly.  Had a BM this morning, positive bowel sounds Extremities trace lower extremity edema brisk capillary refill pulses are palpable Neuro awake oriented no focal deficits GU clear yellow  Resolved Hospital Problem list   Consumptive thrombocytopenia- expected post-op Acute kidney injury  Assessment & Plan:  Acute GI bleed, hemorrhagic shock with ongoing circulatory shock posttransfusion Was eliquis for afib, status post administration of Kcentra Received 3 units of PRBCs; and 4th this am after dropping again over 1 g Plan Continue to trend serial CBCs, awaiting posttransfusion currently Holding Eliquis PPI twice daily Continue to wean norepinephrine for mean arterial pressure greater than 65 Day #2 Zosyn prophylactic coverage for possible ischemic colitis Cont clear liq diet, I think if his hemoglobin remained stable posttransfusion we can advance diet PCT and BNP Co. oximetry and CVP would be helpful Noncontrasted CT scan ordered by primary team will follow  Leukocytosis in setting of Acute GI bleed, possible ischemic colitis Originally contributed to reactivity but now significant increase from left abdominal/flank  Plan Zosyn as  above Continue to trend fever and white blood cell count Ck lactate  AKI Worsening renal function secondary to hypotension from shock, now improving once again Plan Ensure mean arterial pressure greater than 65 Renal dose medications Continue strict intake and output  monitoring A.m. chemistry  Fluid and electrolyte and acid-base balance: Hyponatremia. Anion gap metabolic acidosis the hyponatremia was presumed secondary to volume overload.  With his blood loss is hard to really get a good idea of where his volume status is Plan Check central venous pressure Ensure mean arterial pressure greater than 65 Send urine sodium and osmolality A.m. chemistry  Severe mitral regurgitation and tricuspid regurgitation status post MVR and TV repair Persistent atrial fibrillation status post MAZE on 2/18 Acute HFrEF EF 35 to 40% Sinus node dysfunction with tachy-brady syndrome s/p single chamber PPM (2/25)  Patient originally receiving 400mg  Plan TCTs primary, appreciate assistance Continue amio gtt per TCTs Continue to hold metoprolol and spironolactone in setting of shock  Continue to hold additional diuretics Resume GDMT when medically appropriate  Hold statin As mentioned above, checking coox to be sure shock state is not being further complicated by cardiac function We will hold off for now, but may be some utility in repeating echocardiogram  Prediabetes with hyperglycemia Plan Change sliding scale to [facility] w. HS checks as he now has a clear liquid diet Continue Lantus   Best Practice (right click and "Reselect all SmartList Selections" daily)   Diet/type: Regular consistency (see orders) DVT prophylaxis  on hold  Pressure ulcer(s): N/A GI prophylaxis: PPI Lines: N/A Foley:  N/A Code Status:  full code Last date of multidisciplinary goals of care discussion [updated patient at beside 3/4]   My cct 32 min   Simonne Martinet ACNP-BC [facility] Pulmonary/Critical Care Pager # [phone] OR # [phone] if no answer

## 2023-06-11 NOTE — Progress Notes (Signed)
 Patient ID: [name] [name], male   DOB: 1946/03/31, 78 y.o.   MRN: [id number]  TCTS Evening Rounds:  Hemodynamics stable on 4 NE, down from 10 this am. CVP low, BNP 90.5  UO ok  Had another bloody stool this afternoon.  Hgb 8.4 now with WBC down to 22.2.  Will give him another unit of PRBC's.  No abdominal pain, just discouraged.  Remains afebrile, Procalcitonin 0.25, venous LA 1.1 which is fine.  CT abdomen today did not show any intraabdominal lesion to account or bleeding or leukocytosis. Gallstones but no sign of cholecystitis. No significant pleural effusion and no pericardial effusion.

## 2023-06-12 ENCOUNTER — Inpatient Hospital Stay (HOSPITAL_COMMUNITY)

## 2023-06-12 ENCOUNTER — Other Ambulatory Visit: Payer: Self-pay

## 2023-06-12 DIAGNOSIS — R578 Other shock: Secondary | ICD-10-CM | POA: Diagnosis not present

## 2023-06-12 DIAGNOSIS — K922 Gastrointestinal hemorrhage, unspecified: Secondary | ICD-10-CM | POA: Diagnosis not present

## 2023-06-12 DIAGNOSIS — N179 Acute kidney failure, unspecified: Secondary | ICD-10-CM | POA: Diagnosis not present

## 2023-06-12 DIAGNOSIS — I071 Rheumatic tricuspid insufficiency: Secondary | ICD-10-CM

## 2023-06-12 DIAGNOSIS — D72829 Elevated white blood cell count, unspecified: Secondary | ICD-10-CM | POA: Diagnosis not present

## 2023-06-12 DIAGNOSIS — I4891 Unspecified atrial fibrillation: Secondary | ICD-10-CM | POA: Diagnosis not present

## 2023-06-12 LAB — BASIC METABOLIC PANEL
Anion gap: 6 (ref 5–15)
BUN: 41 mg/dL — ABNORMAL HIGH (ref 8–23)
CO2: 20 mmol/L — ABNORMAL LOW (ref 22–32)
Calcium: 7.1 mg/dL — ABNORMAL LOW (ref 8.9–10.3)
Chloride: 105 mmol/L (ref 98–111)
Creatinine, Ser: 1.52 mg/dL — ABNORMAL HIGH (ref 0.61–1.24)
GFR, Estimated: 46 mL/min — ABNORMAL LOW (ref >60)
Glucose, Bld: 99 mg/dL (ref 70–99)
Potassium: 3.8 mmol/L (ref 3.5–5.1)
Sodium: 131 mmol/L — ABNORMAL LOW (ref 135–145)

## 2023-06-12 LAB — BRAIN NATRIURETIC PEPTIDE: B Natriuretic Peptide: 157.1 pg/mL — ABNORMAL HIGH (ref 0.0–100.0)

## 2023-06-12 LAB — CBC
HCT: 25.7 % — ABNORMAL LOW (ref 39.0–52.0)
Hemoglobin: 9 g/dL — ABNORMAL LOW (ref 13.0–17.0)
MCH: 29.5 pg (ref 26.0–34.0)
MCHC: 35 g/dL (ref 30.0–36.0)
MCV: 84.3 fL (ref 80.0–100.0)
Platelets: 224 10*3/uL (ref 150–400)
RBC: 3.05 MIL/uL — ABNORMAL LOW (ref 4.22–5.81)
RDW: 15.9 % — ABNORMAL HIGH (ref 11.5–15.5)
WBC: 15.9 10*3/uL — ABNORMAL HIGH (ref 4.0–10.5)
nRBC: 1.8 % — ABNORMAL HIGH (ref 0.0–0.2)

## 2023-06-12 LAB — GLUCOSE, CAPILLARY
Glucose-Capillary: 83 mg/dL (ref 70–99)
Glucose-Capillary: 99 mg/dL (ref 70–99)
Glucose-Capillary: 76 mg/dL (ref 70–99)
Glucose-Capillary: 84 mg/dL (ref 70–99)

## 2023-06-12 LAB — PROCALCITONIN: Procalcitonin: 0.16 ng/mL

## 2023-06-12 MED ORDER — INSULIN GLARGINE 100 UNIT/ML ~~LOC~~ SOLN
3.0000 [IU] | Freq: Two times a day (BID) | SUBCUTANEOUS | Status: DC
  Administered 2023-06-12: 3 [IU] via SUBCUTANEOUS
  Filled 2023-06-12 (×2): qty 0.03

## 2023-06-12 MED ORDER — SODIUM CHLORIDE 0.9 % IV SOLN: INTRAVENOUS | Status: AC | PRN

## 2023-06-12 NOTE — Plan of Care (Signed)
  Problem: Clinical Measurements: Goal: Ability to maintain clinical measurements within normal limits will improve Outcome: Progressing Goal: Diagnostic test results will improve Outcome: Progressing Goal: Respiratory complications will improve Outcome: Progressing   Problem: Education: Goal: Knowledge of General Education information will improve Description: Including pain rating scale, medication(s)/side effects and non-pharmacologic comfort measures Outcome: Progressing   Problem: Health Behavior/Discharge Planning: Goal: Ability to manage health-related needs will improve Outcome: Progressing

## 2023-06-12 NOTE — Progress Notes (Addendum)
 Physical Therapy Treatment Patient Details Name: [name] [name] MRN: [id number] DOB: January 24, 1946 Today's Date: 06/12/2023   History of Present Illness 78 y/o male admitted 2/18 for mitral and tricuspid valve repair, MAZE same date. 2/25 DCCV, PPM placed.  3/3 pt transferred out of ICU with readmit on 3/3 due to GI bleed, a-fib RVR, hemorrhagic shock, and hypotension. PMH: Mitral regurgitation, paroxysmal Afib, CVA, HTN, HLD, Prostate CA, glaucoma    PT Comments  Pt in bed upon arrival and agreeable to PT session. Pt improved in today's session by needing less assistance for bed mobility and in the ability to transfer to/from the recliner. Pt was able to roll with ModA and to/from sitting EOB with MaxA x2 for safety. Pt was able to stand with [facility] and pivot to/from the recliner with MinAx2. Pt was steady when taking steps with no LOB. Pt is progressing well towards goals. Updating PT recs to >3hrs post acute rehab to maximize therapy potential. Acute PT to follow.    BP 110/65, HR 103 BPM, SpO2 >95% on RA   If plan is discharge home, recommend the following: A little help with walking and/or transfers;A little help with bathing/dressing/bathroom;Assistance with cooking/housework;Assist for transportation;Help with stairs or ramp for entrance   Can travel by private vehicle     No  Equipment Recommendations  Rolling walker (2 wheels);BSC/3in1    Recommendations for Other Services Rehab consult     Precautions / Restrictions Precautions Precautions: Sternal;Fall;ICD/Pacemaker Recall of Precautions/Restrictions: Intact Precaution/Restrictions Comments: able to recall precautions Restrictions Weight Bearing Restrictions Per Provider Order: Yes Other Position/Activity Restrictions: sternal precautions/ICD/pacemaker precautions     Mobility  Bed Mobility Overal bed mobility: Needs Assistance Bed Mobility: Rolling, Sidelying to Sit, Sit to Sidelying Rolling: Mod assist Sidelying to sit: Max  assist, +2 for safety/equipment    Sit to sidelying: Max assist, +2 for safety/equipment General bed mobility comments: Able to roll bilaterally for pericare and bed pad change with ModA. Pt required MaxA to bring LE's off EOB and raise trunk, +2 for safety. For return to bed, assist for LE management and to shift shoulder for proper alignement in supine    Transfers Overall transfer level: Needs assistance Equipment used: 2 person hand held assist Transfers: Sit to/from Stand, Bed to chair/wheelchair/BSC Sit to Stand: Min assist, +2 safety/equipment, +2 physical assistance   Step pivot transfers: Min assist, +2 safety/equipment     General transfer comment: MinAx2 for slight boost up and rocking to build momentum for stand. Pt able to take steps towards recliner. x2 step pivot transfer w/ pt also able to step towards [facility]    Ambulation/Gait    General Gait Details: deferred        Balance Overall balance assessment: Needs assistance Sitting-balance support: No upper extremity supported, Feet supported Sitting balance-Leahy Scale: Fair     Standing balance support: Bilateral upper extremity supported, During functional activity Standing balance-Leahy Scale: Poor Standing balance comment: reliant on external support and [facility]       Communication Communication Communication: No apparent difficulties Factors Affecting Communication: Other (comment) (soft voice)  Cognition Arousal: Alert Behavior During Therapy: WFL for tasks assessed/performed   PT - Cognitive impairments: Memory    PT - Cognition Comments: pt aware of sternal precautions and limiting use of L UE due to pacemaker placement, Following commands: Intact      Cueing Cueing Techniques: Verbal cues, Tactile cues  Exercises General Exercises - Lower Extremity Ankle Circles/Pumps: AROM, Both, 10 reps, Supine Heel  Slides: AROM, Both, Supine, 5 reps Straight Leg Raises: AROM, 5 reps, Supine        Pertinent  Vitals/Pain Pain Assessment Pain Assessment: No/denies pain     PT Goals (current goals can now be found in the care plan section) Acute Rehab PT Goals PT Goal Formulation: With patient/family Time For Goal Achievement: 06/19/23 Potential to Achieve Goals: Good Progress towards PT goals: Progressing toward goals    Frequency    Min 3X/week       AM-PAC PT "6 Clicks" Mobility   Outcome Measure  Help needed turning from your back to your side while in a flat bed without using bedrails?: A Lot Help needed moving from lying on your back to sitting on the side of a flat bed without using bedrails?: A Lot Help needed moving to and from a bed to a chair (including a wheelchair)?: A Lot Help needed standing up from a chair using your arms (e.g., wheelchair or bedside chair)?: A Lot Help needed to walk in hospital room?: Total Help needed climbing 3-5 steps with a railing? : Total 6 Click Score: 10    End of Session Equipment Utilized During Treatment: Oxygen Activity Tolerance: Patient tolerated treatment well Patient left: in bed;with call bell/phone within reach Nurse Communication: Mobility status PT Visit Diagnosis: Other abnormalities of gait and mobility (R26.89);Difficulty in walking, not elsewhere classified (R26.2)     Time: 1610-9604 PT Time Calculation (min) (ACUTE ONLY): 23 min  Charges:    $Therapeutic Exercise: 8-22 mins $Therapeutic Activity: 8-22 mins PT General Charges $$ ACUTE PT VISIT: 1 Visit                     Hilton Cork, PT, DPT Secure Chat Preferred  Rehab Office [phone]    Arturo Morton Brion Aliment 06/12/2023, 11:25 AM

## 2023-06-12 NOTE — Progress Notes (Signed)
 Peripherally Inserted Central Catheter Placement  The IV Nurse has discussed with the patient and/or persons authorized to consent for the patient, the purpose of this procedure and the potential benefits and risks involved with this procedure.  The benefits include less needle sticks, lab draws from the catheter, and the patient may be discharged home with the catheter. Risks include, but not limited to, infection, bleeding, blood clot (thrombus formation), and puncture of an artery; nerve damage and irregular heartbeat and possibility to perform a PICC exchange if needed/ordered by physician.  Alternatives to this procedure were also discussed.  Bard Power PICC patient education guide, fact sheet on infection prevention and patient information card has been provided to patient /or left at bedside.    PICC Placement Documentation  PICC Double Lumen 06/12/23 Right Brachial 40 cm 0 cm (Active)  Indication for Insertion or Continuance of Line Prolonged intravenous therapies 06/12/23 1326  Exposed Catheter (cm) 0 cm 06/12/23 1326  Site Assessment Clean, Dry, Intact 06/12/23 1326  Lumen #1 Status Flushed;Blood return noted;Saline locked 06/12/23 1326  Lumen #2 Status Flushed;Blood return noted;Saline locked 06/12/23 1326  Dressing Type Transparent 06/12/23 1326  Dressing Status Antimicrobial disc/dressing in place 06/12/23 1326  Line Care Connections checked and tightened 06/12/23 1326  Dressing Change Due 06/19/23 06/12/23 1326       Audrie Gallus 06/12/2023, 1:28 PM

## 2023-06-12 NOTE — Plan of Care (Signed)

## 2023-06-12 NOTE — Progress Notes (Signed)
 NAME:  [name] [name], MRN:  [id number], DOB:  15-May-1945, LOS: 16 ADMISSION DATE:  05/27/2023, CONSULTATION DATE:  2/18 REFERRING MD:  Leafy Ro, CHIEF COMPLAINT:  post op critical care support    History of Present Illness:  78 year old male w/ medical hx as outlined below. Followed by both cards and CVTS for mod/svr MR w/ progressive fatigue. Presented to [facility] 2/18 for elective MR/TR and MAZE Intra-op echo 45-50% EF, LV global hypokinesis, mildly elevated PAS,  RV mildly enlarged.  Bypass started 0852 ended 1118  cell saver  To CVICU post op  PCCM asked to assist w/ post op care   Re-consulted on 3/3 for Afib RVR and Acute GI bleed.    Pertinent  Medical History  Severe MR, mod TR, HFmrEF (EF 55%), Dysrhythmia, HL, PAF s/p ablation 2012 (on xarelto), Prior CVA,chronic UTIs, prostate cancer s/p prostatectomy  Significant Hospital Events: Including procedures, antibiotic start and stop dates in addition to other pertinent events    2/18 MAZE, MV repair, TV repair, extubated overnight 2/25 cardioversion, back in bradycardic rhythm requiring pacing. PPM placed. 3/3 Originally transferred out of ICU, re-consult due to GI bleed (felt either diverticular or ischemic colitis) and Afib RVR on amio gtt and levophed.  Started on Zosyn 3/4 CVC placed yesterday afternoon for worsening shock increase pressor need, 3 units of PRBCs given. 3/5: Remains on norepinephrine infusion, hemoglobin down over 1 g again, received 1 unit of blood still on PPI BID.  Noncontrasted CT abdomen negative for findings that would contribute to his clinical exam He did have some gallstones but no evidence of cholecystitis there is a stable fat density lesion on the left adrenal gland 3/6 improved off pressors  Interim History / Subjective:  Off from pressors.  No distress  Objective   Blood pressure 101/64, pulse (!) 103, temperature 98.1 F (36.7 C), temperature source Oral, resp. rate (!) 25, height 5\' 10"   (1.778 m), weight 87.9 kg, SpO2 99%. CVP:  [1 mmHg-14 mmHg] 10 mmHg      Intake/Output Summary (Last 24 hours) at 06/12/2023 4098 Last data filed at 06/12/2023 0900 Gross per 24 hour  Intake 1874.57 ml  Output 1885 ml  Net -10.43 ml   Filed Weights   06/10/23 0530 06/11/23 0600 06/12/23 0615  Weight: 89.5 kg 89.1 kg 87.9 kg    Examination: General laying in bed no acute distress denies pain HEENT normocephalic atraumatic no JVD mucous membranes moist Pulmonary: Clear to auscultation remains on room air Cardiac: Regular rate and rhythm currently sinus Abdomen soft not tender bowel sounds present GU clear yellow Neuro awake oriented no focal deficits Extremities with warm dry  Resolved Hospital Problem list   Consumptive thrombocytopenia- expected post-op Acute kidney injury Hemorrhagic shock resolved Circulatory shock resolved Assessment & Plan:  Acute GI bleed Was eliquis for afib, status post administration of Kcentra Received total of 6 units since bleeding event.  Last transfusion was on 3/5 hemoglobin stable since then Plan Continue to trend with daily CBC Holding Eliquis PPI twice daily  Sepsis (NOS).  This was likely contributing to prior shock state white blood cell count improving on Zosyn now off pressors.  Lactate normal, all endorgan indices are improving Plan Day #3 Zosyn, will treat for 5 to 7 days Ensure adequate mean arterial pressure Clear liquid diet  Getting arterial and femoral line removed placing PICC line  AKI Improving Plan Ensure mean arterial pressure greater than 65 Renal dose medications Continue strict  intake and output monitoring A.m. chemistry  Fluid and electrolyte and acid-base balance: Hyponatremia. Anion gap metabolic acidosis serum sodium is stable, metabolic acidosis improving/resolved  plan Keep him euvolemic A.m. chemistry Would hold off on diuresis another day  Severe mitral regurgitation and tricuspid regurgitation  status post MVR and TV repair Persistent atrial fibrillation status post MAZE on 2/18 Acute HFrEF EF 35 to 40% Sinus node dysfunction with tachy-brady syndrome s/p single chamber PPM (2/25)  Patient originally receiving 400mg  Plan TCTs primary, appreciate assistance Continue amio gtt per TCTs Continue to hold metoprolol and spironolactone in setting of shock  Continue to hold additional diuretics Resume GDMT when medically appropriate  Hold statin He has a PICC line pending to be placed, should he develop hypotension again there would be value in CVP monitoring and checking Co. oximetry  Prediabetes with hyperglycemia Plan sliding scale to [facility] w. HS checks as he now has a clear liquid diet Continue Lantus 2 units twice daily   Best Practice (right click and "Reselect all SmartList Selections" daily)   Diet/type: clear liquids DVT prophylaxis  on hold has SCD Pressure ulcer(s): N/A GI prophylaxis: PPI Lines: N/A Foley:  N/A Code Status:  full code Last date of multidisciplinary goals of care discussion [updated patient at beside 3/4]     Simonne Martinet ACNP-BC [facility] Pulmonary/Critical Care Pager # [phone] OR # [phone] if no answer

## 2023-06-12 NOTE — Progress Notes (Addendum)
 Progress Note   LOS: 16 days   Chief Complaint: Possible diverticular bleed   Subjective   Patient reports 1 bloody bowel movement overnight.  Denies abdominal pain, nausea, vomiting.  After my evaluation RN noted a medium burgundy bowel movement   Objective   Vital signs in last 24 hours: Temp:  [97.9 F (36.6 C)-98.7 F (37.1 C)] 98.1 F (36.7 C) (03/06 0733) Pulse Rate:  [88-134] 96 (03/06 0930) Resp:  [14-27] 19 (03/06 0930) BP: (79-139)/(53-123) 86/61 (03/06 0930) SpO2:  [86 %-100 %] 93 % (03/06 0930) Arterial Line BP: (62-333)/(42-325) 94/56 (03/06 0930) Weight:  [87.9 kg] 87.9 kg (03/06 0615) Last BM Date : 06/12/23 Last BM recorded by nurses in past 5 days Stool Type: Type 7 (Liquid consistency with no solid pieces) (06/12/2023  2:20 AM)  General:   male in no acute distress, ill-appearing Heart:  Regular rate and rhythm; no murmurs Pulm: Clear anteriorly; no wheezing Abdomen: soft, nondistended, normal bowel sounds in all quadrants. Nontender without guarding. No organomegaly appreciated. Extremities:  No edema Neurologic:  Alert and  oriented x4;  No focal deficits.  Psych:  Cooperative. Normal mood and affect.  Intake/Output from previous day: 03/05 0701 - 03/06 0700 In: 1969.8 [I.V.:1138.2; Blood:661.7; IV Piggyback:170] Out: 2010 [Urine:2010] Intake/Output this shift: Total I/O In: 66.5 [I.V.:66.5] Out: 75 [Urine:75]  Studies/Results: DG CHEST PORT 1 VIEW Result Date: 06/12/2023 CLINICAL DATA:  Pleural effusion. EXAM: PORTABLE CHEST 1 VIEW COMPARISON:  One-view chest x-ray 06/06/2023. FINDINGS: The heart is enlarged. Pacemaker lead is stable. Atrial clip and valve repairs are noted. No edema or effusion is present. Lung volumes are low. Cervical fusion is noted. IMPRESSION: 1. Cardiomegaly without failure. 2. Low lung volumes. Electronically Signed   By: Marin Roberts M.D.   On: 06/12/2023 10:01   [place] EKG SITE RITE Result Date: 06/12/2023 If Site  Rite image not attached, placement could not be confirmed due to current cardiac rhythm.  CT ABDOMEN PELVIS WO CONTRAST Result Date: 06/11/2023 CLINICAL DATA:  Abdominal pain, acute, nonlocalized. Leukocytosis. Rectal bleeding. EXAM: CT ABDOMEN AND PELVIS WITHOUT CONTRAST TECHNIQUE: Multidetector CT imaging of the abdomen and pelvis was performed following the standard protocol without IV contrast. RADIATION DOSE REDUCTION: This exam was performed according to the departmental dose-optimization program which includes automated exposure control, adjustment of the mA and/or kV according to patient size and/or use of iterative reconstruction technique. COMPARISON:  Two-view abdomen 06/07/2023. CT of the abdomen pelvis as 12/01/2020 FINDINGS: Lower chest: Is mild dependent atelectasis is present both lungs. May heart is enlarged. Pacing wires are in place. No significant pleural or pericardial effusion is present. Hepatobiliary: Liver is unremarkable. Layering stones are present in the gallbladder. Layering sludge also noted present. No inflammatory changes are present. The common bile duct is within normal limits. Pancreas: Unremarkable. No pancreatic ductal dilatation or surrounding inflammatory changes. Spleen: Normal in size without focal abnormality. Adrenals/Urinary Tract: The right adrenal gland is within normal limits. A 13 mm fat density lesion is stable in the left adrenal gland. A 5 mm fat containing lesion is present at the upper pole of the left kidney. A 1.5 cm stone or complex of stones is present at the upper pole of the left kidney. No other stone or mass lesion is present. No obstruction is present. The ureters are within normal limits bilaterally. A Foley catheter is present within the urinary bladder. Gas is present within the urinary bladder. Stomach/Bowel: The stomach and duodenum are within  normal limits. Small bowel is unremarkable. Is the terminal ileum is within normal limits. Appendix not  discretely visualized. It may be surgically absent. No focal inflammatory changes are present in the region. Vascular/Lymphatic: Atherosclerotic calcifications are present in the aorta and branch vessels. No aneurysm is present. A right femoral venous line is in place. No significant adenopathy is present. Reproductive: Prostate is unremarkable. Other: No abdominal wall hernia or abnormality. No abdominopelvic ascites. Musculoskeletal: The vertebral body heights and alignment are normal. No focal osseous lesions are present. Fused anterior osteophytes extend from the thoracic spine to the L2 level. Fused anterior osteophytes are present at L2-3 and L4-S1. Median sternotomy is noted. Bony pelvis is within normal limits. The SI joints are fused bilaterally. Mild degenerative changes are present in the hips bilaterally. No acute focal osseous abnormality is present otherwise. IMPRESSION: 1. No acute or focal lesion to explain the patient's symptoms. 2. Cholelithiasis without secondary signs of cholecystitis. 3. 1.5 cm stone or complex of stones at the upper pole of the left kidney without obstruction. 4. Stable 13 mm fat density lesion in the left adrenal gland compatible with adrenal myelolipoma. 5. 5 mm fat containing lesion at the upper pole of the left kidney compatible with angiomyolipoma. 6. Cardiomegaly without failure. Electronically Signed   By: Marin Roberts M.D.   On: 06/11/2023 15:30    Lab Results: Recent Labs    06/11/23 0551 06/11/23 1657 06/12/23 0503  WBC 30.3* 22.2* 15.9*  HGB 8.1* 8.4* 9.0*  HCT 23.3* 23.9* 25.7*  PLT 285 232 224   BMET Recent Labs    06/10/23 0514 06/11/23 0551 06/12/23 0503  NA 131* 129* 131*  K 4.9 4.0 3.8  CL 103 100 105  CO2 14* 15* 20*  GLUCOSE 240* 133* 99  BUN 56* 62* 41*  CREATININE 2.57* 2.42* 1.52*  CALCIUM 7.1* 7.2* 7.1*   LFT Recent Labs    06/11/23 0551  PROT 4.7*  ALBUMIN 2.4*  AST 17  ALT 18  ALKPHOS 47  BILITOT 0.3    PT/INR Recent Labs    06/09/23 1730  LABPROT 34.6*  INR 3.4*     Scheduled Meds:  sodium chloride   Intravenous Once   sodium chloride   Intravenous Once   sodium chloride   Intravenous Once   sodium chloride   Intravenous Once   Chlorhexidine Gluconate Cloth  6 each Topical Daily   insulin aspart  0-15 Units Subcutaneous TID AC   insulin glargine  3 Units Subcutaneous BID   magnesium gluconate  500 mg Oral Daily   pantoprazole (PROTONIX) IV  40 mg Intravenous Q12H   Continuous Infusions:  amiodarone 60 mg/hr (06/12/23 0900)   norepinephrine (LEVOPHED) Adult infusion Stopped (06/12/23 0618)   piperacillin-tazobactam (ZOSYN)  IV Stopped (06/12/23 0249)      Patient profile:   78 year old male with history of severe mitral and tricuspid regurgitation status post mitral valve and tricuspid valve repair, presenting with acute lower GI hemorrhage concerning for possible diverticular bleed versus ischemic colitis    Impression:   Acute GI bleed Hemorrhagic shock Leukocytosis Hgb 9.0, improving (last transfusion 3/5, has received 6 units total) Leukocytosis 15.9, improving CT abdomen pelvis without contrast shows cholelithiasis, nephrolithiasis disease (1.5 cm stone), adrenal myelolipoma, no diverticulitis CT without evidence of diverticulitis/ischemic colitis.  2 medium bloody bowel movements in the last 24 hours.  Hemoglobin stable.  Suspect diverticular bleed.   AKI in the setting of shock BUN 41, CR 1.52, GFR  46, improving   Persistent A-fib s/p maze On amiodarone and Levophed drips   Severe mitral regurgitation and tricuspid regurgitation s/p MVR and TV repair    Plan:   - Continue to hold anticoagulation - Diet as tolerated - Continue daily CBC and transfuse as needed to maintain HGB > 7 - 2 bloody bowel movements in the last 24 hours, with improving AKI if brisk bleeding we could consider CTA but would prefer to hold for now.  Otherwise continue to  monitor. - Hemodynamically stable - Consult urology for nephrolithiasis  Bayley Leanna Sato  06/12/2023, 10:05 AM   Attending physician's note   I have taken a history, reviewed the chart and examined the patient. I performed a substantive portion of this encounter, including complete performance of at least one of the key components, in conjunction with the APP. I agree with the APP's note, impression and recommendations.   Hemoglobin stable, no further bleeding Okay to restart anticoagulation in 2 days if hemoglobin continues to remain stable  Noncontrast CT with large ureteral stone 1.5 cm complex stone in left kidney without obstruction, cholelithiasis otherwise no other acute pathology  AKI is improving Leukocytosis is improving, continue antibiotics to complete 7-day course  Will hold off colonoscopy at this point  GI signing off, available if have any questions   The patient was provided an opportunity to ask questions and all were answered. The patient agreed with the plan and demonstrated an understanding of the instructions.   Iona Beard , MD [phone]

## 2023-06-12 NOTE — Progress Notes (Addendum)
 [facility] DAILY ICU PROGRESS NOTE                   [address]            [place] [address]          [phone]   9 Days Post-Op Procedure(s) (LRB): PACEMAKER IMPLANT (N/A)  Total Length of Stay:  LOS: 16 days   Subjective: Patient tired this am as he did not get much sleep. He denies LLQ abdominal pain, nausea, or vomiting. He has had bowel movements with less blood (per patient)  Objective: Vital signs in last 24 hours: Temp:  [97.9 F (36.6 C)-98.7 F (37.1 C)] 98.7 F (37.1 C) (03/05 2300) Pulse Rate:  [94-134] 94 (03/06 0700) Cardiac Rhythm: Normal sinus rhythm (03/06 0000) Resp:  [14-27] 14 (03/06 0700) BP: (79-139)/(53-123) 100/64 (03/06 0700) SpO2:  [86 %-100 %] 94 % (03/06 0700) Arterial Line BP: (72-333)/(42-325) 90/64 (03/06 0700) Weight:  [87.9 kg] 87.9 kg (03/06 0615)  Filed Weights   06/10/23 0530 06/11/23 0600 06/12/23 0615  Weight: 89.5 kg 89.1 kg 87.9 kg    Weight change: -1.2 kg   Hemodynamic parameters for last 24 hours: CVP:  [1 mmHg-14 mmHg] 5 mmHg  Intake/Output from previous day: 03/05 0701 - 03/06 0700 In: 1969.8 [I.V.:1138.2; Blood:661.7; IV Piggyback:170] Out: 2010 [Urine:2010]  Intake/Output this shift: No intake/output data recorded.  Current Meds: Scheduled Meds:  sodium chloride   Intravenous Once   sodium chloride   Intravenous Once   sodium chloride   Intravenous Once   sodium chloride   Intravenous Once   Chlorhexidine Gluconate Cloth  6 each Topical Daily   insulin aspart  0-15 Units Subcutaneous TID AC   insulin glargine  5 Units Subcutaneous BID   magnesium gluconate  500 mg Oral Daily   pantoprazole (PROTONIX) IV  40 mg Intravenous Q12H   Continuous Infusions:  amiodarone 60 mg/hr (06/12/23 0700)   norepinephrine (LEVOPHED) Adult infusion Stopped (06/12/23 0618)   piperacillin-tazobactam (ZOSYN)  IV Stopped (06/12/23 0249)   PRN Meds:.fentaNYL (SUBLIMAZE) injection, Gerhardt's butt cream, ondansetron  (ZOFRAN) IV, mouth rinse, sodium chloride  General appearance: Alert and cooperative Neurologic: Intact Heart: RRR Lungs: Slightly diminished bibasilar breath sounds Abdomen: Soft, somewhat distended, non tender, bowel sounds Extremities: SCDs in place Wound: Median sternotomy and PPM wounds are clean, dry, healing without signs of infection  Lab Results: CBC: Recent Labs    06/11/23 1657 06/12/23 0503  WBC 22.2* 15.9*  HGB 8.4* 9.0*  HCT 23.9* 25.7*  PLT 232 224   BMET:  Recent Labs    06/11/23 0551 06/12/23 0503  NA 129* 131*  K 4.0 3.8  CL 100 105  CO2 15* 20*  GLUCOSE 133* 99  BUN 62* 41*  CREATININE 2.42* 1.52*  CALCIUM 7.2* 7.1*    CMET: Lab Results  Component Value Date   WBC 15.9 (H) 06/12/2023   HGB 9.0 (L) 06/12/2023   HCT 25.7 (L) 06/12/2023   PLT 224 06/12/2023   GLUCOSE 99 06/12/2023   CHOL 111 12/06/2022   TRIG 98 12/06/2022   HDL 28 (L) 12/06/2022   LDLCALC 65 12/06/2022   ALT 18 06/11/2023   AST 17 06/11/2023   NA 131 (L) 06/12/2023   K 3.8 06/12/2023   CL 105 06/12/2023   CREATININE 1.52 (H) 06/12/2023   BUN 41 (H) 06/12/2023   CO2 20 (L) 06/12/2023   TSH 1.83 04/07/2023   PSA <0.04 08/13/2021  INR 3.4 (H) 06/09/2023   HGBA1C 5.8 (H) 05/23/2023      PT/INR:  Recent Labs    06/09/23 1730  LABPROT 34.6*  INR 3.4*   Radiology: CT ABDOMEN PELVIS WO CONTRAST Result Date: 06/11/2023 CLINICAL DATA:  Abdominal pain, acute, nonlocalized. Leukocytosis. Rectal bleeding. EXAM: CT ABDOMEN AND PELVIS WITHOUT CONTRAST TECHNIQUE: Multidetector CT imaging of the abdomen and pelvis was performed following the standard protocol without IV contrast. RADIATION DOSE REDUCTION: This exam was performed according to the departmental dose-optimization program which includes automated exposure control, adjustment of the mA and/or kV according to patient size and/or use of iterative reconstruction technique. COMPARISON:  Two-view abdomen 06/07/2023. CT of  the abdomen pelvis as 12/01/2020 FINDINGS: Lower chest: Is mild dependent atelectasis is present both lungs. May heart is enlarged. Pacing wires are in place. No significant pleural or pericardial effusion is present. Hepatobiliary: Liver is unremarkable. Layering stones are present in the gallbladder. Layering sludge also noted present. No inflammatory changes are present. The common bile duct is within normal limits. Pancreas: Unremarkable. No pancreatic ductal dilatation or surrounding inflammatory changes. Spleen: Normal in size without focal abnormality. Adrenals/Urinary Tract: The right adrenal gland is within normal limits. A 13 mm fat density lesion is stable in the left adrenal gland. A 5 mm fat containing lesion is present at the upper pole of the left kidney. A 1.5 cm stone or complex of stones is present at the upper pole of the left kidney. No other stone or mass lesion is present. No obstruction is present. The ureters are within normal limits bilaterally. A Foley catheter is present within the urinary bladder. Gas is present within the urinary bladder. Stomach/Bowel: The stomach and duodenum are within normal limits. Small bowel is unremarkable. Is the terminal ileum is within normal limits. Appendix not discretely visualized. It may be surgically absent. No focal inflammatory changes are present in the region. Vascular/Lymphatic: Atherosclerotic calcifications are present in the aorta and branch vessels. No aneurysm is present. A right femoral venous line is in place. No significant adenopathy is present. Reproductive: Prostate is unremarkable. Other: No abdominal wall hernia or abnormality. No abdominopelvic ascites. Musculoskeletal: The vertebral body heights and alignment are normal. No focal osseous lesions are present. Fused anterior osteophytes extend from the thoracic spine to the L2 level. Fused anterior osteophytes are present at L2-3 and L4-S1. Median sternotomy is noted. Bony pelvis is  within normal limits. The SI joints are fused bilaterally. Mild degenerative changes are present in the hips bilaterally. No acute focal osseous abnormality is present otherwise. IMPRESSION: 1. No acute or focal lesion to explain the patient's symptoms. 2. Cholelithiasis without secondary signs of cholecystitis. 3. 1.5 cm stone or complex of stones at the upper pole of the left kidney without obstruction. 4. Stable 13 mm fat density lesion in the left adrenal gland compatible with adrenal myelolipoma. 5. 5 mm fat containing lesion at the upper pole of the left kidney compatible with angiomyolipoma. 6. Cardiomegaly without failure. Electronically Signed   By: Marin Roberts M.D.   On: 06/11/2023 15:30     Assessment/Plan: S/P Procedure(s) (LRB): PACEMAKER IMPLANT (N/A) 1.CV-History of persistent a fib. He has had a fib with rate not well controlled last couple of days as well as ST. SR this am. On Amiodarone drip. Apixaban stopped Monday after am dose as GI bleeding.  2. Pulmonary-on room air. CXR this am appears stable. Encourage incentive spirometer 3. AKI-creatinine this am slightly decreased to 1.52 4. GI-tolerating  clear liquid diet. Per GI, may advance if no further bleeding today 5. Acute GI bleed, hemorrhagic shock-H and H this am decreased to 9 and 25.7. Has received multiple units PRBCs over last few days. On Protonix IV bid. CT abdomen done yesterday showed no acute intra abdominal lesion to explain bleeding. Cholelithiasis but no cholecystitis. GI following. 5. Leukocytosis-WBC this am decreased to 15,900. Pro calcitonin 0.16. Remains afebrile. On Zosyn-? Ischemic colitis or diverticulitis 6.CBGs 75/81/83.Pre op HGA1C 5.8.On low dose Insulin and will decrease to avoid hypoglycemia as not a lot of oral intake of late 7. Deconditioned-continue PT/OT Appreciate CCM and GI's assistance  Donielle Margaretann Loveless PA-C 06/12/2023 7:27 AM   Chart reviewed, patient examined, agree with above.   I saw him early this am and he felt better. Had one maroon stool overnight. NE weaned off.  Hgb ok Renal function continues to improve and good UO.  Will remove femoral line, arterial line and have PICC put in so that he can get up out of bed and mobilize.

## 2023-06-13 DIAGNOSIS — K922 Gastrointestinal hemorrhage, unspecified: Secondary | ICD-10-CM | POA: Diagnosis not present

## 2023-06-13 DIAGNOSIS — I4891 Unspecified atrial fibrillation: Secondary | ICD-10-CM | POA: Diagnosis not present

## 2023-06-13 DIAGNOSIS — R578 Other shock: Secondary | ICD-10-CM | POA: Diagnosis not present

## 2023-06-13 DIAGNOSIS — D72829 Elevated white blood cell count, unspecified: Secondary | ICD-10-CM | POA: Diagnosis not present

## 2023-06-13 DIAGNOSIS — I4819 Other persistent atrial fibrillation: Secondary | ICD-10-CM | POA: Diagnosis not present

## 2023-06-13 DIAGNOSIS — N179 Acute kidney failure, unspecified: Secondary | ICD-10-CM | POA: Diagnosis not present

## 2023-06-13 LAB — TYPE AND SCREEN
ABO/RH(D): A POS
Antibody Screen: NEGATIVE
Unit division: 0
Unit division: 0
Unit division: 0
Unit division: 0
Unit division: 0
Unit division: 0
Unit division: 0

## 2023-06-13 LAB — GLUCOSE, CAPILLARY
Glucose-Capillary: 86 mg/dL (ref 70–99)
Glucose-Capillary: 84 mg/dL (ref 70–99)
Glucose-Capillary: 79 mg/dL (ref 70–99)
Glucose-Capillary: 110 mg/dL — ABNORMAL HIGH (ref 70–99)

## 2023-06-13 LAB — CBC
HCT: 24.9 % — ABNORMAL LOW (ref 39.0–52.0)
Hemoglobin: 8.3 g/dL — ABNORMAL LOW (ref 13.0–17.0)
MCH: 29.4 pg (ref 26.0–34.0)
MCHC: 33.3 g/dL (ref 30.0–36.0)
MCV: 88.3 fL (ref 80.0–100.0)
Platelets: 207 10*3/uL (ref 150–400)
RBC: 2.82 MIL/uL — ABNORMAL LOW (ref 4.22–5.81)
RDW: 16.1 % — ABNORMAL HIGH (ref 11.5–15.5)
WBC: 10.2 10*3/uL (ref 4.0–10.5)
nRBC: 0.8 % — ABNORMAL HIGH (ref 0.0–0.2)

## 2023-06-13 LAB — BASIC METABOLIC PANEL
Anion gap: 11 (ref 5–15)
Anion gap: 19 — ABNORMAL HIGH (ref 5–15)
BUN: 26 mg/dL — ABNORMAL HIGH (ref 8–23)
BUN: 20 mg/dL (ref 8–23)
CO2: 21 mmol/L — ABNORMAL LOW (ref 22–32)
CO2: 21 mmol/L — ABNORMAL LOW (ref 22–32)
Calcium: 6.6 mg/dL — ABNORMAL LOW (ref 8.9–10.3)
Calcium: 6.8 mg/dL — ABNORMAL LOW (ref 8.9–10.3)
Chloride: 100 mmol/L (ref 98–111)
Chloride: 94 mmol/L — ABNORMAL LOW (ref 98–111)
Creatinine, Ser: 1.23 mg/dL (ref 0.61–1.24)
Creatinine, Ser: 1.38 mg/dL — ABNORMAL HIGH (ref 0.61–1.24)
GFR, Estimated: 59 mL/min — ABNORMAL LOW (ref >60)
GFR, Estimated: 52 mL/min — ABNORMAL LOW (ref >60)
Glucose, Bld: 156 mg/dL — ABNORMAL HIGH (ref 70–99)
Glucose, Bld: 209 mg/dL — ABNORMAL HIGH (ref 70–99)
Potassium: 3 mmol/L — ABNORMAL LOW (ref 3.5–5.1)
Potassium: 3.1 mmol/L — ABNORMAL LOW (ref 3.5–5.1)
Sodium: 132 mmol/L — ABNORMAL LOW (ref 135–145)
Sodium: 134 mmol/L — ABNORMAL LOW (ref 135–145)

## 2023-06-13 LAB — BPAM RBC
Blood Product Expiration Date: 202503252359
Blood Product Expiration Date: 202503252359
Blood Product Expiration Date: 202503252359
Blood Product Expiration Date: 202503252359
Blood Product Expiration Date: 202503262359
Blood Product Expiration Date: 202503262359
Blood Product Expiration Date: 202503262359
ISSUE DATE / TIME: 202503031517
ISSUE DATE / TIME: 202503032027
ISSUE DATE / TIME: 202503040117
ISSUE DATE / TIME: 202503050905
ISSUE DATE / TIME: 202503051804
Unit Type and Rh: 6200
Unit Type and Rh: 6200
Unit Type and Rh: 6200
Unit Type and Rh: 6200
Unit Type and Rh: 6200
Unit Type and Rh: 6200
Unit Type and Rh: 6200

## 2023-06-13 LAB — MAGNESIUM: Magnesium: 2.2 mg/dL (ref 1.7–2.4)

## 2023-06-13 LAB — PROCALCITONIN: Procalcitonin: <0.1 ng/mL

## 2023-06-13 LAB — BRAIN NATRIURETIC PEPTIDE: B Natriuretic Peptide: 167.5 pg/mL — ABNORMAL HIGH (ref 0.0–100.0)

## 2023-06-13 LAB — PREPARE RBC (CROSSMATCH)

## 2023-06-13 MED ORDER — BISACODYL 5 MG PO TBEC
10.0000 mg | DELAYED_RELEASE_TABLET | Freq: Once | ORAL | Status: AC
  Administered 2023-06-13: 10 mg via ORAL
  Filled 2023-06-13: qty 2

## 2023-06-13 MED ORDER — NA SULFATE-K SULFATE-MG SULF 17.5-3.13-1.6 GM/177ML PO SOLN
0.5000 | Freq: Once | ORAL | Status: AC
  Administered 2023-06-13: 177 mL via ORAL
  Filled 2023-06-13: qty 1

## 2023-06-13 MED ORDER — FUROSEMIDE 10 MG/ML IJ SOLN
40.0000 mg | Freq: Once | INTRAMUSCULAR | Status: AC
  Administered 2023-06-13: 40 mg via INTRAVENOUS
  Filled 2023-06-13: qty 4

## 2023-06-13 MED ORDER — POTASSIUM CHLORIDE CRYS ER 20 MEQ PO TBCR
20.0000 meq | EXTENDED_RELEASE_TABLET | Freq: Once | ORAL | Status: AC
  Administered 2023-06-13: 20 meq via ORAL
  Filled 2023-06-13: qty 1

## 2023-06-13 MED ORDER — SODIUM CHLORIDE 0.9% IV SOLUTION: Freq: Once | INTRAVENOUS | Status: AC

## 2023-06-13 MED ORDER — SODIUM CHLORIDE 0.9 % IV SOLN: INTRAVENOUS | Status: DC

## 2023-06-13 MED ORDER — POTASSIUM CHLORIDE CRYS ER 20 MEQ PO TBCR
20.0000 meq | EXTENDED_RELEASE_TABLET | ORAL | Status: AC
  Administered 2023-06-13 (×3): 20 meq via ORAL
  Filled 2023-06-13 (×2): qty 1

## 2023-06-13 MED ORDER — POTASSIUM CHLORIDE 10 MEQ/50ML IV SOLN
10.0000 meq | INTRAVENOUS | Status: AC
  Administered 2023-06-13 (×3): 10 meq via INTRAVENOUS
  Filled 2023-06-13 (×3): qty 50

## 2023-06-13 MED ORDER — NA SULFATE-K SULFATE-MG SULF 17.5-3.13-1.6 GM/177ML PO SOLN
0.5000 | Freq: Once | ORAL | Status: AC
  Administered 2023-06-14: 177 mL via ORAL

## 2023-06-13 NOTE — Plan of Care (Signed)

## 2023-06-13 NOTE — Progress Notes (Signed)
 Inpatient Rehab Admissions Coordinator Note:   Per updated therapy recommendations patient was screened for CIR candidacy by Stephania Fragmin, PT. At this time, pt appears to be a potential candidate for CIR. I will place an order for rehab consult for full assessment, per our protocol.  Please contact me any with questions.Estill Dooms, PT, DPT [phone] 06/13/23 4:09 PM

## 2023-06-13 NOTE — Progress Notes (Signed)
 [facility] DAILY ICU PROGRESS NOTE                   [address]            [place] [address]          [phone]   10 Days Post-Op Procedure(s) (LRB): PACEMAKER IMPLANT (N/A)  Total Length of Stay:  LOS: 17 days   Subjective: Patient had bloody bowel movement this am (saw myself). He denies abdominal pain, nausea, vomiting. He is sitting in the chair this am.  Objective: Vital signs in last 24 hours: Temp:  [97.6 F (36.4 C)-98.3 F (36.8 C)] 98.3 F (36.8 C) (03/07 0400) Pulse Rate:  [83-107] 83 (03/07 0600) Cardiac Rhythm: Atrial fibrillation (03/06 2110) Resp:  [10-29] 18 (03/07 0600) BP: (86-124)/(53-75) 94/59 (03/07 0600) SpO2:  [89 %-100 %] 89 % (03/07 0600) Arterial Line BP: (62-100)/(50-69) 100/55 (03/06 1030)  Filed Weights   06/10/23 0530 06/11/23 0600 06/12/23 0615  Weight: 89.5 kg 89.1 kg 87.9 kg    Weight change:    Hemodynamic parameters for last 24 hours: CVP:  [2 mmHg-8 mmHg] 3 mmHg  Intake/Output from previous day: 03/06 0701 - 03/07 0700 In: 969 [P.O.:300; I.V.:596.4; IV Piggyback:72.6] Out: 1495 [Urine:1245; Stool:250]  Intake/Output this shift: Total I/O In: 520.9 [P.O.:300; I.V.:198.1; IV Piggyback:22.7] Out: 770 [Urine:520; Stool:250]  Current Meds: Scheduled Meds:  sodium chloride   Intravenous Once   sodium chloride   Intravenous Once   sodium chloride   Intravenous Once   sodium chloride   Intravenous Once   Chlorhexidine Gluconate Cloth  6 each Topical Daily   insulin aspart  0-15 Units Subcutaneous TID AC   pantoprazole (PROTONIX) IV  40 mg Intravenous Q12H   potassium chloride  20 mEq Oral Q4H   Continuous Infusions:  sodium chloride Stopped (06/12/23 2209)   amiodarone 60 mg/hr (06/13/23 0214)   norepinephrine (LEVOPHED) Adult infusion Stopped (06/12/23 0618)   piperacillin-tazobactam (ZOSYN)  IV 12.5 mL/hr at 06/13/23 0000   PRN Meds:.sodium chloride, Gerhardt's butt cream, ondansetron (ZOFRAN) IV, mouth  rinse, sodium chloride  General appearance: Alert and cooperative Neurologic: Intact Heart: IRRR IRRR Lungs: Clear Abdomen: Soft, somewhat distended, non tender, bowel sounds Extremities: Trace LE edema Wound: Median sternotomy and PPM wounds are clean, dry, healing without signs of infection. Steri strips in place on PPM wound  Lab Results: CBC: Recent Labs    06/12/23 0503 06/13/23 0515  WBC 15.9* 10.2  HGB 9.0* 8.3*  HCT 25.7* 24.9*  PLT 224 207   BMET:  Recent Labs    06/12/23 0503 06/13/23 0515  NA 131* 132*  K 3.8 3.0*  CL 105 100  CO2 20* 21*  GLUCOSE 99 156*  BUN 41* 26*  CREATININE 1.52* 1.23  CALCIUM 7.1* 6.6*    CMET: Lab Results  Component Value Date   WBC 10.2 06/13/2023   HGB 8.3 (L) 06/13/2023   HCT 24.9 (L) 06/13/2023   PLT 207 06/13/2023   GLUCOSE 156 (H) 06/13/2023   CHOL 111 12/06/2022   TRIG 98 12/06/2022   HDL 28 (L) 12/06/2022   LDLCALC 65 12/06/2022   ALT 18 06/11/2023   AST 17 06/11/2023   NA 132 (L) 06/13/2023   K 3.0 (L) 06/13/2023   CL 100 06/13/2023   CREATININE 1.23 06/13/2023   BUN 26 (H) 06/13/2023   CO2 21 (L) 06/13/2023   TSH 1.83 04/07/2023   PSA <0.04 08/13/2021   INR 3.4 (  H) 06/09/2023   HGBA1C 5.8 (H) 05/23/2023      PT/INR:  No results for input(s): "LABPROT", "INR" in the last 72 hours.  Radiology: [facility] Chest Port 1 View Result Date: 06/12/2023 CLINICAL DATA:  PICC EXAM: PORTABLE CHEST 1 VIEW COMPARISON:  X-ray 06/12/2023 earlier FINDINGS: Status post median sternotomy. Stable enlarged cardiopericardial silhouette with atrial occlusion clip and prosthetic aortic and mitral valves. No pneumothorax, effusion or edema. Overlapping cardiac leads. Fixation hardware along the lower cervical spine at the edge of the imaging field. Left upper chest pacemaker. Placement of a right-sided PICC with tip along the central SVC. IMPRESSION: Interval placement of a right-sided PICC with tip overlying the central SVC Electronically  Signed   By: Karen Kays M.D.   On: 06/12/2023 16:04   [place] EKG SITE RITE Result Date: 06/12/2023 If Site Rite image not attached, placement could not be confirmed due to current cardiac rhythm.    Assessment/Plan: S/P Procedure(s) (LRB): PACEMAKER IMPLANT (N/A) 1.CV-History of persistent a fib. He has had a fib with rate not well controlled last couple of days as well as ST. A fib with HR 110's and less.  On Amiodarone drip. Apixaban stopped Monday after am dose as GI bleeding.  2. Pulmonary-On room air.  Encourage incentive spirometer 3. AKI resolved-creatinine this am decreased to 1.23 4. GI-tolerating clear liquid diet.  5. Acute GI bleed, hemorrhagic shock-H and H this am decreased to 8.3 and 24.9. Has received multiple units PRBCs over last few days. ? transfuse this am. On Protonix IV bid. Per GI note yesterday, holding off on colonoscopy and have signed off? 5. Leukocytosis resolved-WBC this am decreased to 10,200. Pro calcitonin 0.10. Remains afebrile. On Zosyn-? Ischemic colitis or diverticulitis 6. CBGs 76/84/86.Pre op HGA1C 5.8. 7. Supplement potassium 8. Deconditioned-continue PT/OT   Ardelle Balls PA-C 06/13/2023 6:59 AM

## 2023-06-13 NOTE — Progress Notes (Addendum)
 Progress Note   LOS: 17 days   Chief Complaint:GI bleed   Subjective   Overnight patient had multiple maroon-colored stools.  He states feeling better than he has been throughout this admission though still feels very weak/fatigued.  He is sitting in his chair eating breakfast without difficulty.  Denies abdominal pain, nausea, vomiting.   Objective   Vital signs in last 24 hours: Temp:  [97.6 F (36.4 C)-98.3 F (36.8 C)] 98.3 F (36.8 C) (03/07 0400) Pulse Rate:  [83-107] 83 (03/07 0600) Resp:  [10-29] 18 (03/07 0600) BP: (86-124)/(53-75) 94/59 (03/07 0600) SpO2:  [89 %-100 %] 89 % (03/07 0600) Arterial Line BP: (76-100)/(55-68) 100/55 (03/06 1030) Weight:  [87.8 kg] 87.8 kg (03/07 0700) Last BM Date : 06/12/23 Last BM recorded by nurses in past 5 days Stool Type: Type 7 (Liquid consistency with no solid pieces) (06/12/2023  8:00 PM)  General:   male in no acute distress Heart:  Regular rate and rhythm; no murmurs Pulm: Clear anteriorly; no wheezing Abdomen: soft, nondistended, normal bowel sounds in all quadrants. Nontender without guarding. No organomegaly appreciated. Extremities:  No edema Neurologic:  Alert and  oriented x4;  No focal deficits.  Psych:  Cooperative. Normal mood and affect.  Intake/Output from previous day: 03/06 0701 - 03/07 0700 In: 969 [P.O.:300; I.V.:596.4; IV Piggyback:72.6] Out: 1495 [Urine:1245; Stool:250] Intake/Output this shift: No intake/output data recorded.  Studies/Results: DG Chest Port 1 View Result Date: 06/12/2023 CLINICAL DATA:  PICC EXAM: PORTABLE CHEST 1 VIEW COMPARISON:  X-ray 06/12/2023 earlier FINDINGS: Status post median sternotomy. Stable enlarged cardiopericardial silhouette with atrial occlusion clip and prosthetic aortic and mitral valves. No pneumothorax, effusion or edema. Overlapping cardiac leads. Fixation hardware along the lower cervical spine at the edge of the imaging field. Left upper chest pacemaker. Placement  of a right-sided PICC with tip along the central SVC. IMPRESSION: Interval placement of a right-sided PICC with tip overlying the central SVC Electronically Signed   By: Karen Kays M.D.   On: 06/12/2023 16:04   DG CHEST PORT 1 VIEW Result Date: 06/12/2023 CLINICAL DATA:  Pleural effusion. EXAM: PORTABLE CHEST 1 VIEW COMPARISON:  One-view chest x-ray 06/06/2023. FINDINGS: The heart is enlarged. Pacemaker lead is stable. Atrial clip and valve repairs are noted. No edema or effusion is present. Lung volumes are low. Cervical fusion is noted. IMPRESSION: 1. Cardiomegaly without failure. 2. Low lung volumes. Electronically Signed   By: Marin Roberts M.D.   On: 06/12/2023 10:01   [place] EKG SITE RITE Result Date: 06/12/2023 If Site Rite image not attached, placement could not be confirmed due to current cardiac rhythm.  CT ABDOMEN PELVIS WO CONTRAST Result Date: 06/11/2023 CLINICAL DATA:  Abdominal pain, acute, nonlocalized. Leukocytosis. Rectal bleeding. EXAM: CT ABDOMEN AND PELVIS WITHOUT CONTRAST TECHNIQUE: Multidetector CT imaging of the abdomen and pelvis was performed following the standard protocol without IV contrast. RADIATION DOSE REDUCTION: This exam was performed according to the departmental dose-optimization program which includes automated exposure control, adjustment of the mA and/or kV according to patient size and/or use of iterative reconstruction technique. COMPARISON:  Two-view abdomen 06/07/2023. CT of the abdomen pelvis as 12/01/2020 FINDINGS: Lower chest: Is mild dependent atelectasis is present both lungs. May heart is enlarged. Pacing wires are in place. No significant pleural or pericardial effusion is present. Hepatobiliary: Liver is unremarkable. Layering stones are present in the gallbladder. Layering sludge also noted present. No inflammatory changes are present. The common bile duct is within normal  limits. Pancreas: Unremarkable. No pancreatic ductal dilatation or surrounding  inflammatory changes. Spleen: Normal in size without focal abnormality. Adrenals/Urinary Tract: The right adrenal gland is within normal limits. A 13 mm fat density lesion is stable in the left adrenal gland. A 5 mm fat containing lesion is present at the upper pole of the left kidney. A 1.5 cm stone or complex of stones is present at the upper pole of the left kidney. No other stone or mass lesion is present. No obstruction is present. The ureters are within normal limits bilaterally. A Foley catheter is present within the urinary bladder. Gas is present within the urinary bladder. Stomach/Bowel: The stomach and duodenum are within normal limits. Small bowel is unremarkable. Is the terminal ileum is within normal limits. Appendix not discretely visualized. It may be surgically absent. No focal inflammatory changes are present in the region. Vascular/Lymphatic: Atherosclerotic calcifications are present in the aorta and branch vessels. No aneurysm is present. A right femoral venous line is in place. No significant adenopathy is present. Reproductive: Prostate is unremarkable. Other: No abdominal wall hernia or abnormality. No abdominopelvic ascites. Musculoskeletal: The vertebral body heights and alignment are normal. No focal osseous lesions are present. Fused anterior osteophytes extend from the thoracic spine to the L2 level. Fused anterior osteophytes are present at L2-3 and L4-S1. Median sternotomy is noted. Bony pelvis is within normal limits. The SI joints are fused bilaterally. Mild degenerative changes are present in the hips bilaterally. No acute focal osseous abnormality is present otherwise. IMPRESSION: 1. No acute or focal lesion to explain the patient's symptoms. 2. Cholelithiasis without secondary signs of cholecystitis. 3. 1.5 cm stone or complex of stones at the upper pole of the left kidney without obstruction. 4. Stable 13 mm fat density lesion in the left adrenal gland compatible with adrenal  myelolipoma. 5. 5 mm fat containing lesion at the upper pole of the left kidney compatible with angiomyolipoma. 6. Cardiomegaly without failure. Electronically Signed   By: Marin Roberts M.D.   On: 06/11/2023 15:30    Lab Results: Recent Labs    06/11/23 1657 06/12/23 0503 06/13/23 0515  WBC 22.2* 15.9* 10.2  HGB 8.4* 9.0* 8.3*  HCT 23.9* 25.7* 24.9*  PLT 232 224 207   BMET Recent Labs    06/11/23 0551 06/12/23 0503 06/13/23 0515  NA 129* 131* 132*  K 4.0 3.8 3.0*  CL 100 105 100  CO2 15* 20* 21*  GLUCOSE 133* 99 156*  BUN 62* 41* 26*  CREATININE 2.42* 1.52* 1.23  CALCIUM 7.2* 7.1* 6.6*   LFT Recent Labs    06/11/23 0551  PROT 4.7*  ALBUMIN 2.4*  AST 17  ALT 18  ALKPHOS 47  BILITOT 0.3   PT/INR No results for input(s): "LABPROT", "INR" in the last 72 hours.   Scheduled Meds:  sodium chloride   Intravenous Once   sodium chloride   Intravenous Once   sodium chloride   Intravenous Once   sodium chloride   Intravenous Once   sodium chloride   Intravenous Once   Chlorhexidine Gluconate Cloth  6 each Topical Daily   furosemide  40 mg Intravenous Once   insulin aspart  0-15 Units Subcutaneous TID AC   pantoprazole (PROTONIX) IV  40 mg Intravenous Q12H   potassium chloride  20 mEq Oral Q4H   potassium chloride  20 mEq Oral Once   Continuous Infusions:  sodium chloride Stopped (06/12/23 2209)   amiodarone 60 mg/hr (06/13/23 0214)  norepinephrine (LEVOPHED) Adult infusion Stopped (06/12/23 0618)   piperacillin-tazobactam (ZOSYN)  IV 3.375 g (06/13/23 0711)      Patient profile:   78 year old male with history of severe mitral and tricuspid regurgitation status post mitral valve and tricuspid valve repair, presenting with acute lower GI hemorrhage concerning for possible diverticular bleed versus ischemic colitis    Impression:   Acute GI bleed Hemorrhagic shock Leukocytosis Hgb 8.3 down from 9.0 (last transfusion 3/5, has received 6 units  total) BUN 26, Cr. 1.23 Leukocytosis resolved CT abdomen pelvis without contrast shows cholelithiasis, nephrolithiasis disease (1.5 cm stone), adrenal myelolipoma, no diverticulitis CT without evidence of diverticulitis/ischemic colitis.  Continued maroon colored stools and drop in hgb.  Hemodynamically stable   AKI in the setting of shock BUN 41, CR 1.52, GFR 46, improving   Persistent A-fib s/p maze On amiodarone and Levophed drips   Severe mitral regurgitation and tricuspid regurgitation s/p MVR and TV repair    Plan:   - Plan for colonoscopy tomorrow - I thoroughly discussed the procedure with the patient (at bedside) to include nature of the procedure, alternatives, benefits, and risks (including but not limited to bleeding, infection, perforation, anesthesia/cardiac pulmonary complications).  Patient verbalized understanding and gave verbal consent to proceed with procedure. - Continue daily CBC and transfuse as needed to maintain HGB > 7 - Suprep, clear liquids, n.p.o. midnight (except for prep)  Bayley Leanna Sato  06/13/2023, 9:36 AM   Attending physician's note   I have taken a history, reviewed the chart and examined the patient. I performed a substantive portion of this encounter, including complete performance of at least one of the key components, in conjunction with the APP. I agree with the APP's note, impression and recommendations.    Overnight had recurrent lower GI bleed with multiple maroon-colored stools, significant drop in hemoglobin status post 2 unit PRBC transfusion We will plan to proceed with colonoscopy for further evaluation, diverticular hemorrhage, AVM, or neoplastic lesion in the differential.  Clear liquid diet Bowel prep N.p.o. after 5 AM tomorrow Monitor hemoglobin and transfuse >7  The patient was provided an opportunity to ask questions and all were answered. The patient agreed with the plan and demonstrated an understanding of the  instructions.   Iona Beard , MD [phone]

## 2023-06-13 NOTE — Progress Notes (Addendum)
 NAME:  [name] [name], MRN:  [id number], DOB:  07-04-1945, LOS: 17 ADMISSION DATE:  05/27/2023, CONSULTATION DATE:  2/18 REFERRING MD:  Leafy Ro, CHIEF COMPLAINT:  post op critical care support    History of Present Illness:  78 year old male w/ medical hx as outlined below. Followed by both cards and CVTS for mod/svr MR w/ progressive fatigue. Presented to [facility] 2/18 for elective MR/TR and MAZE Intra-op echo 45-50% EF, LV global hypokinesis, mildly elevated PAS,  RV mildly enlarged.  Bypass started 0852 ended 1118  cell saver  To CVICU post op  PCCM asked to assist w/ post op care   Re-consulted on 3/3 for Afib RVR and Acute GI bleed.    Pertinent  Medical History  Severe MR, mod TR, HFmrEF (EF 55%), Dysrhythmia, HL, PAF s/p ablation 2012 (on xarelto), Prior CVA,chronic UTIs, prostate cancer s/p prostatectomy  Significant Hospital Events: Including procedures, antibiotic start and stop dates in addition to other pertinent events    2/18 MAZE, MV repair, TV repair, extubated overnight 2/25 cardioversion, back in bradycardic rhythm requiring pacing. PPM placed. 3/3 Originally transferred out of ICU, re-consult due to GI bleed (felt either diverticular or ischemic colitis) and Afib RVR on amio gtt and levophed.  Started on Zosyn 3/4 CVC placed yesterday afternoon for worsening shock increase pressor need, 3 units of PRBCs given. 3/5: Remains on norepinephrine infusion, hemoglobin down over 1 g again, received 1 unit of blood still on PPI BID.  Noncontrasted CT abdomen negative for findings that would contribute to his clinical exam He did have some gallstones but no evidence of cholecystitis there is a stable fat density lesion on the left adrenal gland 3/6 improved off pressors 3/7 maroon stools again hemoglobin again dropping requiring 2 more units of blood GI reconsulted with plan to proceed with colonoscopy 3/8  Interim History / Subjective:  Remains off pressors.  No pain.   Answered questions in regards to colonoscopy  Objective   Blood pressure 105/72, pulse 93, temperature 98.3 F (36.8 C), resp. rate 17, height 5\' 10"  (1.778 m), weight 87.8 kg, SpO2 98%. CVP:  [2 mmHg-8 mmHg] 3 mmHg      Intake/Output Summary (Last 24 hours) at 06/13/2023 1107 Last data filed at 06/13/2023 1000 Gross per 24 hour  Intake 865.98 ml  Output 1420 ml  Net -554.02 ml   Filed Weights   06/11/23 0600 06/12/23 0615 06/13/23 0700  Weight: 89.1 kg 87.9 kg 87.8 kg    Examination: General: Sitting up in chair no acute distress this morning HEENT normocephalic atraumatic no jugular venous distention is appreciated Pulmonary: Clear to auscultation currently room air no accessory use Cardiac: Regular rate and rhythm Abdomen: Soft nontender no organomegaly bowel sounds present tolerating clear liquid diet, did have maroon stool x 2 last night Extremities: Warm dry brisk cap refill no significant edema Neuro: Awake oriented no focal deficits.  Resolved Hospital Problem list   Consumptive thrombocytopenia- expected post-op Acute kidney injury Hemorrhagic shock resolved Circulatory shock resolved Assessment & Plan:  Acute GI bleed Was eliquis for afib, status post administration of Kcentra Received total of 8 units since bleeding event.  Last transfusion was on again this morning on 3/7 and receiving 2 units Plan Continue to trend with daily CBC Holding Eliquis PPI twice daily GI has been Reconsulted, with plan for colonoscopy in the morning on 3/8  Sepsis (NOS).  Working diagnosis here was transient ischemic colitis this was likely contributing to prior  shock state white blood cell count improving on Zosyn now off pressors.  Lactate normal, all endorgan indices are improving Plan Day #4 of 7 Zosyn  AKI Improving Plan Ensure adequate mean arterial pressure Renal dose medications Strict intake output  Fluid and electrolyte and acid-base balance: Hyponatremia. Nonanion  gap metabolic acidosis, hypokalemia plan Lasix today, potassium replacement already ordered Follow-up afternoon chemistry and A.m. chemistry  Severe mitral regurgitation and tricuspid regurgitation status post MVR and TV repair Persistent atrial fibrillation status post MAZE on 2/18 Acute HFrEF EF 35 to 40% Sinus node dysfunction with tachy-brady syndrome s/p single chamber PPM (2/25)  Patient originally receiving 400mg  Plan Continue amio gtt per TCTs Got Lasix today, will need to reassess timing of when to resume beta-blocker and other goal-directed medical therapy as indicated Hold statin for now  Prediabetes with hyperglycemia Plan sliding scale to [facility] w. HS checks as he now has a clear liquid diet    Best Practice (right click and "Reselect all SmartList Selections" daily)   Diet/type: clear liquids DVT prophylaxis  on hold has SCD Pressure ulcer(s): N/A GI prophylaxis: PPI Lines: N/A Foley:  N/A Code Status:  full code Last date of multidisciplinary goals of care discussion [updated patient at beside 3/4]   No CCT  my time 25 min  Simonne Martinet ACNP-BC [facility] Pulmonary/Critical Care Pager # [phone] OR # [phone] if no answer    Critical care attending attestation note: I agree with Advanced Practitioner's note, impression, and recommendations as outlined. I have taken an independent interval history, reviewed the chart and examined the patient. The following reflects my medical decision making and independent critical care time   Synopsis of assessment and plan: 78 year old male with acute lower GI bleeding and hemorrhagic shock  Patient continued to have maroon-colored stool Hemoglobin dropped, received PRBC transfusion Serum creatinine continue to improve Off vasopressor support   Physical exam: General: Elderly male, sitting on recliner HEENT: [place]/AT, eyes anicteric.  moist mucus membranes Neuro: Alert, awake following commands Chest: Coarse breath  sounds, no wheezes or rhonchi Heart: Regular rate and rhythm, no murmurs or gallops Abdomen: Soft, nontender, nondistended, bowel sounds present Skin: No rash   Labs and images were reviewed  Assessment and plan: Acute lower GI bleeding with hemorrhagic shock Acute blood loss anemia status post PRBC transfusion Severe mitral regurgitation and tricuspid regurgitation status post MVR and TV repair Paroxysmal A-fib with RVR Acute HFrEF AKI due to ischemic ATN, improving Hypervolemic hyponatremia Hypokalemia Prediabetes with hyperglycemia  Patient continued to have maroon-colored stool Continue Protonix GI reconsulted, patient will go for colonoscopy tomorrow Monitor H&H and transfuse if less than 7 Hold anticoagulation Continue amiodarone Heart rate is not well-controlled Monitor intake and output Continue diuresis Serum creatinine started improving Continue aggressive electrolyte replacement Fingersticks are well-controlled    Cheri Fowler, MD  Pulmonary Critical Care See Amion for pager If no response to pager, please call [phone] until 7pm After 7pm, Please call E-link [phone]   06/13/2023, 1:44 PM

## 2023-06-13 NOTE — H&P (View-Only) (Signed)
 Progress Note   LOS: 17 days   Chief Complaint:GI bleed   Subjective   Overnight patient had multiple maroon-colored stools.  He states feeling better than he has been throughout this admission though still feels very weak/fatigued.  He is sitting in his chair eating breakfast without difficulty.  Denies abdominal pain, nausea, vomiting.   Objective   Vital signs in last 24 hours: Temp:  [97.6 F (36.4 C)-98.3 F (36.8 C)] 98.3 F (36.8 C) (03/07 0400) Pulse Rate:  [83-107] 83 (03/07 0600) Resp:  [10-29] 18 (03/07 0600) BP: (86-124)/(53-75) 94/59 (03/07 0600) SpO2:  [89 %-100 %] 89 % (03/07 0600) Arterial Line BP: (76-100)/(55-68) 100/55 (03/06 1030) Weight:  [87.8 kg] 87.8 kg (03/07 0700) Last BM Date : 06/12/23 Last BM recorded by nurses in past 5 days Stool Type: Type 7 (Liquid consistency with no solid pieces) (06/12/2023  8:00 PM)  General:   male in no acute distress Heart:  Regular rate and rhythm; no murmurs Pulm: Clear anteriorly; no wheezing Abdomen: soft, nondistended, normal bowel sounds in all quadrants. Nontender without guarding. No organomegaly appreciated. Extremities:  No edema Neurologic:  Alert and  oriented x4;  No focal deficits.  Psych:  Cooperative. Normal mood and affect.  Intake/Output from previous day: 03/06 0701 - 03/07 0700 In: 969 [P.O.:300; I.V.:596.4; IV Piggyback:72.6] Out: 1495 [Urine:1245; Stool:250] Intake/Output this shift: No intake/output data recorded.  Studies/Results: DG Chest Port 1 View Result Date: 06/12/2023 CLINICAL DATA:  PICC EXAM: PORTABLE CHEST 1 VIEW COMPARISON:  X-ray 06/12/2023 earlier FINDINGS: Status post median sternotomy. Stable enlarged cardiopericardial silhouette with atrial occlusion clip and prosthetic aortic and mitral valves. No pneumothorax, effusion or edema. Overlapping cardiac leads. Fixation hardware along the lower cervical spine at the edge of the imaging field. Left upper chest pacemaker. Placement  of a right-sided PICC with tip along the central SVC. IMPRESSION: Interval placement of a right-sided PICC with tip overlying the central SVC Electronically Signed   By: Karen Kays M.D.   On: 06/12/2023 16:04   DG CHEST PORT 1 VIEW Result Date: 06/12/2023 CLINICAL DATA:  Pleural effusion. EXAM: PORTABLE CHEST 1 VIEW COMPARISON:  One-view chest x-ray 06/06/2023. FINDINGS: The heart is enlarged. Pacemaker lead is stable. Atrial clip and valve repairs are noted. No edema or effusion is present. Lung volumes are low. Cervical fusion is noted. IMPRESSION: 1. Cardiomegaly without failure. 2. Low lung volumes. Electronically Signed   By: Marin Roberts M.D.   On: 06/12/2023 10:01   [place] EKG SITE RITE Result Date: 06/12/2023 If Site Rite image not attached, placement could not be confirmed due to current cardiac rhythm.  CT ABDOMEN PELVIS WO CONTRAST Result Date: 06/11/2023 CLINICAL DATA:  Abdominal pain, acute, nonlocalized. Leukocytosis. Rectal bleeding. EXAM: CT ABDOMEN AND PELVIS WITHOUT CONTRAST TECHNIQUE: Multidetector CT imaging of the abdomen and pelvis was performed following the standard protocol without IV contrast. RADIATION DOSE REDUCTION: This exam was performed according to the departmental dose-optimization program which includes automated exposure control, adjustment of the mA and/or kV according to patient size and/or use of iterative reconstruction technique. COMPARISON:  Two-view abdomen 06/07/2023. CT of the abdomen pelvis as 12/01/2020 FINDINGS: Lower chest: Is mild dependent atelectasis is present both lungs. May heart is enlarged. Pacing wires are in place. No significant pleural or pericardial effusion is present. Hepatobiliary: Liver is unremarkable. Layering stones are present in the gallbladder. Layering sludge also noted present. No inflammatory changes are present. The common bile duct is within normal  limits. Pancreas: Unremarkable. No pancreatic ductal dilatation or surrounding  inflammatory changes. Spleen: Normal in size without focal abnormality. Adrenals/Urinary Tract: The right adrenal gland is within normal limits. A 13 mm fat density lesion is stable in the left adrenal gland. A 5 mm fat containing lesion is present at the upper pole of the left kidney. A 1.5 cm stone or complex of stones is present at the upper pole of the left kidney. No other stone or mass lesion is present. No obstruction is present. The ureters are within normal limits bilaterally. A Foley catheter is present within the urinary bladder. Gas is present within the urinary bladder. Stomach/Bowel: The stomach and duodenum are within normal limits. Small bowel is unremarkable. Is the terminal ileum is within normal limits. Appendix not discretely visualized. It may be surgically absent. No focal inflammatory changes are present in the region. Vascular/Lymphatic: Atherosclerotic calcifications are present in the aorta and branch vessels. No aneurysm is present. A right femoral venous line is in place. No significant adenopathy is present. Reproductive: Prostate is unremarkable. Other: No abdominal wall hernia or abnormality. No abdominopelvic ascites. Musculoskeletal: The vertebral body heights and alignment are normal. No focal osseous lesions are present. Fused anterior osteophytes extend from the thoracic spine to the L2 level. Fused anterior osteophytes are present at L2-3 and L4-S1. Median sternotomy is noted. Bony pelvis is within normal limits. The SI joints are fused bilaterally. Mild degenerative changes are present in the hips bilaterally. No acute focal osseous abnormality is present otherwise. IMPRESSION: 1. No acute or focal lesion to explain the patient's symptoms. 2. Cholelithiasis without secondary signs of cholecystitis. 3. 1.5 cm stone or complex of stones at the upper pole of the left kidney without obstruction. 4. Stable 13 mm fat density lesion in the left adrenal gland compatible with adrenal  myelolipoma. 5. 5 mm fat containing lesion at the upper pole of the left kidney compatible with angiomyolipoma. 6. Cardiomegaly without failure. Electronically Signed   By: Marin Roberts M.D.   On: 06/11/2023 15:30    Lab Results: Recent Labs    06/11/23 1657 06/12/23 0503 06/13/23 0515  WBC 22.2* 15.9* 10.2  HGB 8.4* 9.0* 8.3*  HCT 23.9* 25.7* 24.9*  PLT 232 224 207   BMET Recent Labs    06/11/23 0551 06/12/23 0503 06/13/23 0515  NA 129* 131* 132*  K 4.0 3.8 3.0*  CL 100 105 100  CO2 15* 20* 21*  GLUCOSE 133* 99 156*  BUN 62* 41* 26*  CREATININE 2.42* 1.52* 1.23  CALCIUM 7.2* 7.1* 6.6*   LFT Recent Labs    06/11/23 0551  PROT 4.7*  ALBUMIN 2.4*  AST 17  ALT 18  ALKPHOS 47  BILITOT 0.3   PT/INR No results for input(s): "LABPROT", "INR" in the last 72 hours.   Scheduled Meds:  sodium chloride   Intravenous Once   sodium chloride   Intravenous Once   sodium chloride   Intravenous Once   sodium chloride   Intravenous Once   sodium chloride   Intravenous Once   Chlorhexidine Gluconate Cloth  6 each Topical Daily   furosemide  40 mg Intravenous Once   insulin aspart  0-15 Units Subcutaneous TID AC   pantoprazole (PROTONIX) IV  40 mg Intravenous Q12H   potassium chloride  20 mEq Oral Q4H   potassium chloride  20 mEq Oral Once   Continuous Infusions:  sodium chloride Stopped (06/12/23 2209)   amiodarone 60 mg/hr (06/13/23 0214)  norepinephrine (LEVOPHED) Adult infusion Stopped (06/12/23 0618)   piperacillin-tazobactam (ZOSYN)  IV 3.375 g (06/13/23 0711)      Patient profile:   78 year old male with history of severe mitral and tricuspid regurgitation status post mitral valve and tricuspid valve repair, presenting with acute lower GI hemorrhage concerning for possible diverticular bleed versus ischemic colitis    Impression:   Acute GI bleed Hemorrhagic shock Leukocytosis Hgb 8.3 down from 9.0 (last transfusion 3/5, has received 6 units  total) BUN 26, Cr. 1.23 Leukocytosis resolved CT abdomen pelvis without contrast shows cholelithiasis, nephrolithiasis disease (1.5 cm stone), adrenal myelolipoma, no diverticulitis CT without evidence of diverticulitis/ischemic colitis.  Continued maroon colored stools and drop in hgb.  Hemodynamically stable   AKI in the setting of shock BUN 41, CR 1.52, GFR 46, improving   Persistent A-fib s/p maze On amiodarone and Levophed drips   Severe mitral regurgitation and tricuspid regurgitation s/p MVR and TV repair    Plan:   - Plan for colonoscopy tomorrow - I thoroughly discussed the procedure with the patient (at bedside) to include nature of the procedure, alternatives, benefits, and risks (including but not limited to bleeding, infection, perforation, anesthesia/cardiac pulmonary complications).  Patient verbalized understanding and gave verbal consent to proceed with procedure. - Continue daily CBC and transfuse as needed to maintain HGB > 7 - Suprep, clear liquids, n.p.o. midnight (except for prep)  Bayley Leanna Sato  06/13/2023, 9:36 AM   Attending physician's note   I have taken a history, reviewed the chart and examined the patient. I performed a substantive portion of this encounter, including complete performance of at least one of the key components, in conjunction with the APP. I agree with the APP's note, impression and recommendations.    Overnight had recurrent lower GI bleed with multiple maroon-colored stools, significant drop in hemoglobin status post 2 unit PRBC transfusion We will plan to proceed with colonoscopy for further evaluation, diverticular hemorrhage, AVM, or neoplastic lesion in the differential.  Clear liquid diet Bowel prep N.p.o. after 5 AM tomorrow Monitor hemoglobin and transfuse >7  The patient was provided an opportunity to ask questions and all were answered. The patient agreed with the plan and demonstrated an understanding of the  instructions.   Iona Beard , MD [phone]

## 2023-06-13 NOTE — Progress Notes (Signed)
 Patient ID: [name] [name], male   DOB: 1945/09/05, 78 y.o.   MRN: [id number]  TCTS Evening Rounds:  Hemodynamically stable. Rhythm looks like atrial flutter with vent rate 85 on IV amio.  Feeling better after transfusion. No abd pain.  GI planning colonoscopy tomorrow.

## 2023-06-13 NOTE — Progress Notes (Signed)
 10 Days Post-Op Procedure(s) (LRB): PACEMAKER IMPLANT (N/A) Subjective:  Feels ok. Had a couple more maroon stools overnight. No abdominal pain or nausea. Taking clear liquids and would like to have regular food.  Objective: Vital signs in last 24 hours: Temp:  [97.6 F (36.4 C)-98.3 F (36.8 C)] 98.3 F (36.8 C) (03/07 0400) Pulse Rate:  [83-107] 83 (03/07 0600) Cardiac Rhythm: Atrial fibrillation (03/06 2110) Resp:  [10-29] 18 (03/07 0600) BP: (86-124)/(53-75) 94/59 (03/07 0600) SpO2:  [89 %-100 %] 89 % (03/07 0600) Arterial Line BP: (62-100)/(50-69) 100/55 (03/06 1030) Weight:  [87.8 kg] 87.8 kg (03/07 0700)  Hemodynamic parameters for last 24 hours: CVP:  [2 mmHg-8 mmHg] 3 mmHg  Intake/Output from previous day: 03/06 0701 - 03/07 0700 In: 969 [P.O.:300; I.V.:596.4; IV Piggyback:72.6] Out: 1495 [Urine:1245; Stool:250] Intake/Output this shift: No intake/output data recorded.  General appearance: alert and cooperative Neurologic: intact Heart: regular rate and rhythm, S1, S2 normal, no murmur Lungs: clear to auscultation bilaterally Abdomen: soft, non-tender; bowel sounds normal Extremities: edema minimal Wound: incision healing well  Lab Results: Recent Labs    06/12/23 0503 06/13/23 0515  WBC 15.9* 10.2  HGB 9.0* 8.3*  HCT 25.7* 24.9*  PLT 224 207   BMET:  Recent Labs    06/12/23 0503 06/13/23 0515  NA 131* 132*  K 3.8 3.0*  CL 105 100  CO2 20* 21*  GLUCOSE 99 156*  BUN 41* 26*  CREATININE 1.52* 1.23  CALCIUM 7.1* 6.6*    PT/INR: No results for input(s): "LABPROT", "INR" in the last 72 hours. ABG    Component Value Date/Time   PHART 7.510 (H) 06/01/2023 2230   HCO3 32.8 (H) 06/01/2023 2230   TCO2 34 (H) 06/01/2023 2230   ACIDBASEDEF 5.0 (H) 05/27/2023 1859   O2SAT 94 06/01/2023 2230   CBG (last 3)  Recent Labs    06/12/23 1542 06/12/23 1936 06/13/23 0652  GLUCAP 76 84 86    Assessment/Plan:  Hemodynamically stable in sinus rhythm  on IV amio.  Persistent GI bleeding with drop in Hgb to 8.3. I will transfuse 2 units PRBC's. I send GI a message on Secure Chat to see him again since they signed off yesterday.  AKI due to hypovolemic shock with GI bleed. Creat continues to improve and close to baseline.  Leukocytosis resolved on empiric Zosyn.    LOS: 17 days    Alleen Borne 06/13/2023

## 2023-06-13 NOTE — Progress Notes (Signed)
 Occupational Therapy Treatment Patient Details Name: [name] [name] MRN: [id number] DOB: October 18, 1945 Today's Date: 06/13/2023   History of present illness 78 y/o male admitted 2/18 for mitral and tricuspid valve repair, MAZE same date. 2/25 DCCV, PPM placed.  3/3 pt transferred out of ICU with readmit on 3/3 due to GI bleed, a-fib RVR, hemorrhagic shock, and hypotension. PMH: Mitral regurgitation, paroxysmal Afib, CVA, HTN, HLD, Prostate CA, glaucoma   OT comments  OT session focused on training in techniques for increased safety and independence with ADLs and bed mobility/functional transfers during/in preparation for ADLs. Pt currently demonstrates ability to largely complete UB ADLs with Mod I to Mod assist, LB ADLs with Max to Total assist +2, bed mobility with Min to Max assist, and functional step-pivot transfers with assist +1 with Mod assist, all while adhering to precautions. VSS on RA throughout session. Pt demonstrates overall good carry over of training in sternal and ICD/pacemaker with only occasional cues needed to adhere to them during functional tasks. OT also answer pt and daughter's questions regarding post-acute rehab and provided education in similarities/differences between AIR and short-term rehab at [facility]. [name]  change in medical status with new GI bleed on 3/3 affecting pt overall functional level. However, pt participates well in sessions, is motivated to return to [facility], has good family support, and is making slow but consistent progress toward goals. Pt will benefit from continued acute skilled OT services to address deficits outlined below and increase safety and independence with functional tasks. Post acute discharge, pt will benefit from intensive inpatient skilled rehab services > 3 hours per day to maximize rehab potential.       If plan is discharge home, recommend the following:  A lot of help with walking and/or transfers;Two people to help with walking and/or transfers;A  lot of help with bathing/dressing/bathroom;Two people to help with bathing/dressing/bathroom;Assistance with cooking/housework;Assist for transportation;Help with stairs or ramp for entrance   Equipment Recommendations  BSC/3in1    Recommendations for Other Services Rehab consult    Precautions / Restrictions Precautions Precautions: Sternal;Fall;ICD/Pacemaker Precaution Booklet Issued: Yes (comment) Recall of Precautions/Restrictions: Intact Precaution/Restrictions Comments: able to recall precautions with occasional cues to adhere to precautions during functional tasks Restrictions Weight Bearing Restrictions Per Provider Order: Yes RUE Weight Bearing Per Provider Order: Non weight bearing LUE Weight Bearing Per Provider Order: Non weight bearing Other Position/Activity Restrictions: sternal precautions/ICD/pacemaker precautions       Mobility Bed Mobility Overal bed mobility: Needs Assistance Bed Mobility: Rolling, Sit to Sidelying Rolling: Min assist, Mod assist       Sit to sidelying: Max assist General bed mobility comments: Max assist to manage trunk and elevate B LE transferring sit to sidelying while adhering to precautions    Transfers Overall transfer level: Needs assistance Equipment used: 1 person hand held assist Transfers: Sit to/from Stand, Bed to chair/wheelchair/BSC Sit to Stand: Mod assist     Step pivot transfers: Mod assist     General transfer comment: Rocking to build momentum and Mod assist +1 to power up and to maintain balance in stepping     Balance Overall balance assessment: Needs assistance Sitting-balance support: No upper extremity supported, Feet supported Sitting balance-Leahy Scale: Fair     Standing balance support: Bilateral upper extremity supported, During functional activity Standing balance-Leahy Scale: Poor Standing balance comment: reliant on external support to maintain balance  ADL  either performed or assessed with clinical judgement   ADL Overall ADL's : Needs assistance/impaired Eating/Feeding: Modified independent;Sitting   Grooming: Wash/dry hands;Wash/dry face;Oral care;Set up;Sitting   Upper Body Bathing: Moderate assistance;Sitting;Cueing for compensatory techniques (adhering to precautions)   Lower Body Bathing: Maximal assistance;Sit to/from stand;+2 for safety/equipment;Sitting/lateral leans;Cueing for compensatory techniques (adhering to precautions)   Upper Body Dressing : Minimal assistance;Sitting;Cueing for compensatory techniques (adhering to precautions)   Lower Body Dressing: Total assistance;+2 for safety/equipment;Sitting/lateral leans;Sit to/from stand   Toilet Transfer: Moderate assistance;BSC/3in1 (step-pivot; HHA +1) Toilet Transfer Details (indicate cue type and reason): simulated chair to bed Toileting- Clothing Manipulation and Hygiene: Total assistance;+2 for safety/equipment;Sit to/from stand         General ADL Comments: Pt with continued decreased activity tolerance but with noted improvements over last skilled OT session.    Extremity/Trunk Assessment Upper Extremity Assessment Upper Extremity Assessment: Right hand dominant;Generalized weakness (tested within sternal and ICD/pacemaker precautions)   Lower Extremity Assessment Lower Extremity Assessment: Defer to PT evaluation        Vision       Perception     Praxis     Communication Communication Communication: No apparent difficulties   Cognition Arousal: Alert Behavior During Therapy: WFL for tasks assessed/performed Cognition: No apparent impairments             OT - Cognition Comments: AAOx4 and pleasant throughout session. Demonstrates good carryover of prior training in strenal and ICD/pacemaker precations, only requiring occasional cues during funcitonal tasks to adhere to precautions.                 Following commands: Intact         Cueing   Cueing Techniques: Verbal cues, Visual cues  Exercises      Shoulder Instructions       General Comments OT educated pt and his daughter in what to expect in terms of goals and funcitonal progression in post-acute itensive inpatient rehab services and differences/similarities between CIR and short-term rehab in SNF with both verbalizing understanding and reporting no further quesitons at this time. VSS on RA throughout session with BP soft but stable. [name] daughter present throughout session. RN and NTeach present during a portion of session.    Pertinent Vitals/ Pain       Pain Assessment Pain Assessment: 0-10 Pain Score: 3  Pain Location: buttocks after sitting in recliner for >2 hours Pain Descriptors / Indicators: Aching, Discomfort, Sore Pain Intervention(s): Monitored during session, Repositioned  Home Living                                          Prior Functioning/Environment              Frequency  Min 2X/week        Progress Toward Goals  OT Goals(current goals can now be found in the care plan section)  Progress towards OT goals: Progressing toward goals     Plan      Co-evaluation                 AM-PAC OT "6 Clicks" Daily Activity     Outcome Measure   Help from another person eating meals?: A Little Help from another person taking care of personal grooming?: A Little Help from another person toileting, which includes using toliet, bedpan, or urinal?: Total Help from another  person bathing (including washing, rinsing, drying)?: A Lot Help from another person to put on and taking off regular upper body clothing?: A Little Help from another person to put on and taking off regular lower body clothing?: Total 6 Click Score: 13    End of Session Equipment Utilized During Treatment: Gait belt  OT Visit Diagnosis: Other abnormalities of gait and mobility (R26.89);Unsteadiness on feet (R26.81);Muscle weakness  (generalized) (M62.81);Other (comment) (decreased activity tolerance)   Activity Tolerance Patient tolerated treatment well   Patient Left in bed;with call bell/phone within reach;with family/visitor present   Nurse Communication Mobility status;Other (comment) (Pt particiapted well and is making progress toward OT goals)        Time: 1610-9604 OT Time Calculation (min): 52 min  Charges: OT General Charges $OT Visit: 1 Visit OT Treatments $Self Care/Home Management : 23-37 mins $Therapeutic Activity: 8-22 mins  Chyan Carnero "Orson Eva., OTR/L, MA Acute Rehab [phone]   Lendon Colonel 06/13/2023, 1:19 PM

## 2023-06-14 ENCOUNTER — Encounter (HOSPITAL_COMMUNITY): Payer: Self-pay | Admitting: Thoracic Surgery (Cardiothoracic Vascular Surgery)

## 2023-06-14 ENCOUNTER — Encounter (HOSPITAL_COMMUNITY)
Admission: RE | Disposition: A | Discharge status: Other Acute Inpatient Hospital | Payer: Self-pay | Source: Home / Self Care | Attending: Thoracic Surgery (Cardiothoracic Vascular Surgery)

## 2023-06-14 ENCOUNTER — Inpatient Hospital Stay (HOSPITAL_COMMUNITY): Admitting: Certified Registered"

## 2023-06-14 DIAGNOSIS — Z860100 Personal history of colon polyps, unspecified: Secondary | ICD-10-CM

## 2023-06-14 DIAGNOSIS — K6389 Other specified diseases of intestine: Secondary | ICD-10-CM

## 2023-06-14 DIAGNOSIS — I10 Essential (primary) hypertension: Secondary | ICD-10-CM

## 2023-06-14 DIAGNOSIS — K559 Vascular disorder of intestine, unspecified: Secondary | ICD-10-CM

## 2023-06-14 DIAGNOSIS — K648 Other hemorrhoids: Secondary | ICD-10-CM

## 2023-06-14 DIAGNOSIS — K649 Unspecified hemorrhoids: Secondary | ICD-10-CM

## 2023-06-14 DIAGNOSIS — K5731 Diverticulosis of large intestine without perforation or abscess with bleeding: Secondary | ICD-10-CM

## 2023-06-14 DIAGNOSIS — D124 Benign neoplasm of descending colon: Secondary | ICD-10-CM

## 2023-06-14 DIAGNOSIS — D120 Benign neoplasm of cecum: Secondary | ICD-10-CM

## 2023-06-14 DIAGNOSIS — K529 Noninfective gastroenteritis and colitis, unspecified: Secondary | ICD-10-CM

## 2023-06-14 DIAGNOSIS — K5730 Diverticulosis of large intestine without perforation or abscess without bleeding: Secondary | ICD-10-CM

## 2023-06-14 DIAGNOSIS — D123 Benign neoplasm of transverse colon: Secondary | ICD-10-CM

## 2023-06-14 DIAGNOSIS — K625 Hemorrhage of anus and rectum: Secondary | ICD-10-CM

## 2023-06-14 DIAGNOSIS — D126 Benign neoplasm of colon, unspecified: Secondary | ICD-10-CM

## 2023-06-14 HISTORY — PX: BONE BIOPSY: SHX375

## 2023-06-14 HISTORY — PX: COLONOSCOPY: SHX5424

## 2023-06-14 LAB — GLUCOSE, CAPILLARY
Glucose-Capillary: 96 mg/dL (ref 70–99)
Glucose-Capillary: 93 mg/dL (ref 70–99)
Glucose-Capillary: 89 mg/dL (ref 70–99)
Glucose-Capillary: 101 mg/dL — ABNORMAL HIGH (ref 70–99)

## 2023-06-14 LAB — TYPE AND SCREEN
ABO/RH(D): A POS
Antibody Screen: NEGATIVE
Unit division: 0
Unit division: 0

## 2023-06-14 LAB — CBC
HCT: 30.5 % — ABNORMAL LOW (ref 39.0–52.0)
Hemoglobin: 10.2 g/dL — ABNORMAL LOW (ref 13.0–17.0)
MCH: 29.2 pg (ref 26.0–34.0)
MCHC: 33.4 g/dL (ref 30.0–36.0)
MCV: 87.4 fL (ref 80.0–100.0)
Platelets: 196 10*3/uL (ref 150–400)
RBC: 3.49 MIL/uL — ABNORMAL LOW (ref 4.22–5.81)
RDW: 16.2 % — ABNORMAL HIGH (ref 11.5–15.5)
WBC: 10.5 10*3/uL (ref 4.0–10.5)
nRBC: 0.3 % — ABNORMAL HIGH (ref 0.0–0.2)

## 2023-06-14 LAB — BASIC METABOLIC PANEL
Anion gap: 13 (ref 5–15)
BUN: 17 mg/dL (ref 8–23)
CO2: 23 mmol/L (ref 22–32)
Calcium: 7.5 mg/dL — ABNORMAL LOW (ref 8.9–10.3)
Chloride: 101 mmol/L (ref 98–111)
Creatinine, Ser: 1.26 mg/dL — ABNORMAL HIGH (ref 0.61–1.24)
GFR, Estimated: 58 mL/min — ABNORMAL LOW (ref >60)
Glucose, Bld: 102 mg/dL — ABNORMAL HIGH (ref 70–99)
Potassium: 3.1 mmol/L — ABNORMAL LOW (ref 3.5–5.1)
Sodium: 137 mmol/L (ref 135–145)

## 2023-06-14 LAB — BPAM RBC
Blood Product Expiration Date: 202503262359
Blood Product Expiration Date: 202503262359
ISSUE DATE / TIME: 202503070944
ISSUE DATE / TIME: 202503071226
Unit Type and Rh: 6200
Unit Type and Rh: 6200

## 2023-06-14 SURGERY — COLONOSCOPY
Anesthesia: Monitor Anesthesia Care

## 2023-06-14 MED ORDER — PROPOFOL 10 MG/ML IV BOLUS
INTRAVENOUS | Status: DC | PRN
  Administered 2023-06-14: 35 mg via INTRAVENOUS
  Administered 2023-06-14: 15 mg via INTRAVENOUS

## 2023-06-14 MED ORDER — POTASSIUM CHLORIDE CRYS ER 20 MEQ PO TBCR
40.0000 meq | EXTENDED_RELEASE_TABLET | Freq: Once | ORAL | Status: AC
  Administered 2023-06-14: 40 meq via ORAL
  Filled 2023-06-14: qty 2

## 2023-06-14 MED ORDER — POTASSIUM CHLORIDE 20 MEQ PO PACK
20.0000 meq | PACK | ORAL | Status: AC
  Administered 2023-06-14: 20 meq
  Filled 2023-06-14 (×2): qty 1

## 2023-06-14 MED ORDER — SPOT INK MARKER SYRINGE KIT
PACK | SUBMUCOSAL | Status: AC
  Filled 2023-06-14: qty 5

## 2023-06-14 MED ORDER — PROPOFOL 500 MG/50ML IV EMUL
INTRAVENOUS | Status: DC | PRN
  Administered 2023-06-14: 100 ug/kg/min via INTRAVENOUS

## 2023-06-14 MED ORDER — SPOT INK MARKER SYRINGE KIT
PACK | SUBMUCOSAL | Status: DC | PRN
  Administered 2023-06-14: 5 mL via SUBMUCOSAL

## 2023-06-14 NOTE — PMR Pre-admission (Signed)
 PMR Admission Coordinator Pre-Admission Assessment  Patient: [name] [name] is an 78 y.o., male MRN: [id number] DOB: 06/30/1945 Height: 5\' 10"  (177.8 cm) Weight: 87.3 kg              Insurance Information HMO: yes    PPO:      PCP:      IPA:      80/20:      OTHER:  PRIMARY: BCBS Medicare      Policy#: [id number]      Subscriber: part CM Name: ***      Phone#: ***     Fax#: [phone] Pre-Cert#: [id number]      Employer:  Benefits:  Phone #: [phone]     Name:  Eff. Date: 04/09/23-04/07/2198     Deduct: does not have one      Out of Pocket Max: $3,150 ($141.52 met)      Life Max: NA  CIR: $335/day co-pay for days 1-5      SNF: 100% coverage for days 1-20, $214 co-pay/day for days 21-60, 100% coverage for days 61-100 Outpatient: $10 co-pay/visit     Co-Pay:  Home Health: 100% coverage      Co-Pay:  DME: 80% coverage     Co-Pay: 20% co-insurance Providers: in-network SECONDARY:       Policy#:       Phone#:   Artist:       Phone#:   The Data processing manager" for patients in Inpatient Rehabilitation Facilities with attached "Privacy Act Statement-Health Care Records" was provided and verbally reviewed with: {CHL IP Patient Family VO:[id number]}  Emergency Contact Information Contact Information     Name Relation Home Work Gypsy Other   [phone]   McDowell,Kim Daughter [phone]  [phone]      Other Contacts   None on File    Current Medical History  Patient Admitting Diagnosis: cardiac debility History of Present Illness: Pt is an 78 year old male with medical hx significant for: CVA, chronic UTI's, prostate CA s/p prostatectomy, PAF s/p ablation (2012), severe MR, mod TR.  Pt presented to [facility] on 05/27/23 for mitral valve repair, tricuspid valve repair, MAZE and TEE by Dr. Leafy Ro. Chest x-ray showed right basilar atelectasis and small left pleural effusion. Pt had A-fib with RVR overnight requiring boluses  of amiodarone. Chest tubes discharged on 05/30/23. Electrophysiology consulted for tachybradycardia syndrome. Pt continued to have persistent A-fib on 2/23. Pt in A-fib with RVR on 2/24. Pt underwernt TEE, PPM and cardioversion by Dr. [name] on 06/03/23. Pt had post operative ileus. Pt noted to have blood in stool x2 with associated hypotension on 06/09/23. Pt received 3 units of PRBC. GI consulted. Pt transfused 2 units of PRBC on 3/5.CT abdomen negative for intraabdominal lesion. Showed large ureteral stone in left kidney without obstruction; cholelithiasis no other acute pathology. No significant pleural effusion and no pericardial effusion. Pt received 2 units of PRBC on 3/7. Pt underwent colonoscopy by Dr. Adela Lank on 06/14/23. Colonoscopy negative for active bleeding. Showed what appeared to be healing ischemic colitis in transverse colon and hepatic flexure. Also showed polyps and diverticuli. Biopsied polyp. Therapy evaluations completed and CIR recommended d/t pt's deficits in functional mobility.     Glasgow Coma Scale Score: 15  Patient's medical record from [facility] has been reviewed by the rehabilitation admission coordinator and physician.  Past Medical History  Past Medical History:  Diagnosis Date   Arthritis    Back  CVA (cerebral infarction) 07/14/2009   TIA   Dysrhythmia    A. Fib   Heart murmur    History of kidney stones    Hyperlipidemia    Hypertension    Nephrolithiasis    Paroxysmal atrial fibrillation (HCC)    Paroxysmal atrial flutter (HCC)    a. s/p ablation 2012.   Prostate cancer (HCC)    Stroke [facility])    TIA - no deficits    Has the patient had major surgery during 100 days prior to admission? Yes  Family History  family history includes Heart disease (age of onset: 41) in his mother; Prostate cancer in his father; Testicular cancer in his brother.   Current Medications   Current Facility-Administered Medications:    0.9 %  sodium  chloride infusion (Manually program via Guardrails IV Fluids), , Intravenous, Once, Dela Cruz, James Ivanoff, MD   0.9 %  sodium chloride infusion (Manually program via Guardrails IV Fluids), , Intravenous, Once, Vladimir Faster, James Ivanoff, MD   0.9 %  sodium chloride infusion (Manually program via Guardrails IV Fluids), , Intravenous, Once, Bartle, Payton Doughty, MD   0.9 %  sodium chloride infusion (Manually program via Guardrails IV Fluids), , Intravenous, Once, Alleen Borne, MD   0.9 %  sodium chloride infusion, , Intravenous, Continuous, McMichael, Bayley M, PA-C, Last Rate: 10 mL/hr at 06/15/23 1700, Infusion Verify at 06/15/23 1700   [EXPIRED] amiodarone (NEXTERONE PREMIX) 360-4.14 MG/200ML-% (1.8 mg/mL) IV infusion, 60 mg/hr, Intravenous, Continuous, Stopped at 06/09/23 1855 **FOLLOWED BY** amiodarone (NEXTERONE PREMIX) 360-4.14 MG/200ML-% (1.8 mg/mL) IV infusion, 60 mg/hr, Intravenous, Continuous, Chand, Garnet Sierras, MD, Stopped at 06/15/23 1415   amiodarone (PACERONE) tablet 400 mg, 400 mg, Oral, BID, Stehler, Bailey C, PA-C, 400 mg at 06/16/23 1009   aspirin EC tablet 81 mg, 81 mg, Oral, Daily, Doree Fudge M, PA-C, 81 mg at 06/16/23 1009   Chlorhexidine Gluconate Cloth 2 % PADS 6 each, 6 each, Topical, Daily, Marinus Maw, MD, 6 each at 06/16/23 1009   [COMPLETED] digoxin (LANOXIN) tablet 0.25 mg, 0.25 mg, Oral, Once, 0.25 mg at 06/16/23 1107 **FOLLOWED BY** [START ON 06/17/2023] digoxin (LANOXIN) tablet 0.125 mg, 0.125 mg, Oral, Daily, Zimmerman, Donielle M, PA-C   Fe Fum-Vit C-Vit B12-FA (TRIGELS-F FORTE) capsule 1 capsule, 1 capsule, Oral, Daily, Doree Fudge M, PA-C, 1 capsule at 06/16/23 1009   furosemide (LASIX) tablet 40 mg, 40 mg, Oral, Daily, Stehler, Bailey C, PA-C, 40 mg at 06/16/23 1009   Gerhardt's butt cream, , Topical, PRN, Eugenio Hoes, MD   ondansetron [facility]) injection 4 mg, 4 mg, Intravenous, Q6H PRN, Marinus Maw, MD, 4 mg at 06/10/23 1416   Oral care mouth rinse, 15 mL,  Mouth Rinse, PRN, Marinus Maw, MD   pantoprazole (PROTONIX) injection 40 mg, 40 mg, Intravenous, Q12H, Chand, Garnet Sierras, MD, 40 mg at 06/16/23 1010   piperacillin-tazobactam (ZOSYN) IVPB 3.375 g, 3.375 g, Intravenous, Q8H, Simonne Martinet, NP, Last Rate: 12.5 mL/hr at 06/16/23 1457, 3.375 g at 06/16/23 1457   potassium chloride (KLOR-CON M) CR tablet 30 mEq, 30 mEq, Oral, TID, Joycelyn Man, Donielle M, PA-C, 30 mEq at 06/16/23 1009   sodium chloride (OCEAN) 0.65 % nasal spray 1 spray, 1 spray, Each Nare, PRN, Massie Maroon, MD, 1 spray at 06/06/23 0526   sodium chloride flush (NS) 0.9 % injection 3 mL, 3 mL, Intravenous, Q12H, Stehler, Bailey C, PA-C, 3 mL at 06/16/23 1010   sodium chloride flush (NS) 0.9 % injection  3 mL, 3 mL, Intravenous, PRN, Jenny Reichmann, PA-C  Patients Current Diet:  Diet Order             Diet Heart Room service appropriate? Yes; Fluid consistency: Thin  Diet effective now                   Precautions / Restrictions Precautions Precautions: Sternal, Fall, ICD/Pacemaker Precaution Booklet Issued: Yes (comment) Precaution/Restrictions Comments: able to recall precautions Restrictions Weight Bearing Restrictions Per Provider Order: Yes RUE Weight Bearing Per Provider Order: Non weight bearing LUE Weight Bearing Per Provider Order: Non weight bearing Other Position/Activity Restrictions: sternal precautions/ICD/pacemaker precautions   Has the patient had 2 or more falls or a fall with injury in the past year?No  Prior Activity Level Limited Community (1-2x/wk): drives, gets out of house ~1-2 days/weeks  Prior Functional Level Prior Function Prior Level of Function : Independent/Modified Independent, Driving Mobility Comments: was walking independently and assisting wife after TKA ADLs Comments: struggled with LB bathing and dressing  Self Care: Did the patient need help bathing, dressing, using the toilet or eating?  Independent  Indoor  Mobility: Did the patient need assistance with walking from room to room (with or without device)? Independent  Stairs: Did the patient need assistance with internal or external stairs (with or without device)? Independent  Functional Cognition: Did the patient need help planning regular tasks such as shopping or remembering to take medications? Independent  Patient Information Are you of Hispanic, Latino/a,or Spanish origin?: A. No, not of Hispanic, Latino/a, or Spanish origin What is your race?: A. White Do you need or want an interpreter to communicate with a doctor or health care staff?: 0. No  Patient's Response To:  Health Literacy and Transportation Is the patient able to respond to health literacy and transportation needs?: Yes Health Literacy - How often do you need to have someone help you when you read instructions, pamphlets, or other written material from your doctor or pharmacy?: Never In the past 12 months, has lack of transportation kept you from medical appointments or from getting medications?: No In the past 12 months, has lack of transportation kept you from meetings, work, or from getting things needed for daily living?: No  Home Assistive Devices / Equipment Home Equipment: Other (comment)  Prior Device Use: Indicate devices/aids used by the patient prior to current illness, exacerbation or injury? None of the above  Current Functional Level Cognition  Orientation Level: Oriented X4    Extremity Assessment (includes Sensation/Coordination)  Upper Extremity Assessment: Right hand dominant, Generalized weakness (tested within sternal and ICD/pacemaker precautions) LUE Deficits / Details: pacemaker precautions, full AROM elbow to hand LUE Coordination: decreased gross motor  Lower Extremity Assessment: Defer to PT evaluation    ADLs  Overall ADL's : Needs assistance/impaired Eating/Feeding: Modified independent, Sitting Grooming: Wash/dry hands, Wash/dry face,  Oral care, Set up, Sitting Upper Body Bathing: Minimal assistance, Sitting Lower Body Bathing: Maximal assistance, Sit to/from stand Upper Body Dressing : Minimal assistance, Sitting Upper Body Dressing Details (indicate cue type and reason): min assist due to lines Lower Body Dressing: Maximal assistance, Sitting/lateral leans Lower Body Dressing Details (indicate cue type and reason): to change socks Toilet Transfer: Moderate assistance, BSC/3in1 (step-pivot; HHA +1) Toilet Transfer Details (indicate cue type and reason): simulated chair to bed Toileting- Clothing Manipulation and Hygiene: Total assistance, +2 for safety/equipment, Sit to/from stand General ADL Comments: Pt with continued decreased activity tolerance but with noted improvements over last skilled  OT session.    Mobility  Overal bed mobility: Needs Assistance Bed Mobility: Rolling, Sit to Sidelying Rolling: Min assist Sidelying to sit: Mod assist Supine to sit: HOB elevated, Mod assist Sit to supine: Mod assist Sit to sidelying: Min assist General bed mobility comments: cues for precautions and min A to manage LEs into bed    Transfers  Overall transfer level: Needs assistance Equipment used: None Transfers: Sit to/from Stand, Bed to chair/wheelchair/BSC Sit to Stand: Mod assist Bed to/from chair/wheelchair/BSC transfer type:: Step pivot Step pivot transfers: Mod assist General transfer comment: mod A to boost to stand with pt utilizing heart pillow at chest, good eccentric control when coming to sit    Ambulation / Gait / Stairs / Wheelchair Mobility  Ambulation/Gait Ambulation/Gait assistance: Contact guard assist Gait Distance (Feet): 125 Feet Assistive device: Rolling walker (2 wheels) Gait Pattern/deviations: Step-through pattern, Decreased stride length General Gait Details: cues for larger strides as pt with tendecy for short shuffling steps initially, able to correct Gait velocity: decr Gait velocity  interpretation: <1.8 ft/sec, indicate of risk for recurrent falls    Posture / Balance Balance Overall balance assessment: Needs assistance Sitting-balance support: No upper extremity supported, Feet supported Sitting balance-Leahy Scale: Fair Standing balance support: Single extremity supported, During functional activity Standing balance-Leahy Scale: Poor Standing balance comment: realint on UE support for dynamic tasks    Special needs/care consideration Continuous Drip IV  0.9% sodium chloride infusion, Skin Surgical Incision: chest/lateral, left, upper; Rash: thigh/bilateral; Erythema/Redness: buttocks/bilateral; Ecchymosis: hand/bilateral, Urethral Catheter and Diabetic management Novolog 0-15 units 3x daily with meals     Previous Home Environment (from acute therapy documentation) Living Arrangements: Spouse/significant other  Lives With: Spouse Available Help at Discharge: Family, Available 24 hours/day Type of Home: House Home Layout: Two level, Able to live on main level with bedroom/bathroom Home Access: Level entry Entrance Stairs-Number of Steps: 2 Bathroom Shower/Tub: Psychologist, counselling, Engineer, manufacturing systems: Handicapped height Bathroom Accessibility: Yes How Accessible: Accessible via walker Home Care Services: No Additional Comments: has upright walker  Discharge Living Setting Plans for Discharge Living Setting: Patient's home Type of Home at Discharge: House Discharge Home Layout: Two level, Able to live on main level with bedroom/bathroom Alternate Level Stairs-Rails: Right, Left Alternate Level Stairs-Number of Steps: 3+1 down+1 up Discharge Home Access: Level entry Discharge Bathroom Shower/Tub: Walk-in shower, Tub/shower unit Discharge Bathroom Toilet: Handicapped height Discharge Bathroom Accessibility: Yes How Accessible: Accessible via walker Does the patient have any problems obtaining your medications?: No  Social/Family/Support  Systems Anticipated Caregiver: Odette Horns (daughter), Orland Visconti (wife) and pt's son Anticipated Caregiver's Contact Information: Kim: [phone]; Darlene-[phone] Caregiver Availability: 24/7 Discharge Plan Discussed with Primary Caregiver: Yes Is Caregiver In Agreement with Plan?: Yes Does Caregiver/Family have Issues with Lodging/Transportation while Pt is in Rehab?: No   Goals Patient/Family Goal for Rehab: Mod I:PT,  Mod I-Supevision: OT Expected length of stay: 7 days Pt/Family Agrees to Admission and willing to participate: Yes Program Orientation Provided & Reviewed with Pt/Caregiver Including Roles  & Responsibilities: Yes   Decrease burden of Care through IP rehab admission: NA   Possible need for SNF placement upon discharge:Not anticipated   Patient Condition: {PATIENT'S CONDITION:22832}  Preadmission Screen Completed By:  Domingo Pulse, CCC-SLP, 06/16/2023 4:33 PM ______________________________________________________________________   Discussed status with Dr. Marland Kitchenon***at *** and received approval for admission today.  Admission Coordinator:  Domingo Pulse, time***/Date***

## 2023-06-14 NOTE — Anesthesia Postprocedure Evaluation (Signed)
 Anesthesia Post Note  Patient: Elmin Wiederholt Radle  Procedure(s) Performed: COLONOSCOPY BIOPSY,Colon TATTOOING, USING INTRADERMAL PIGMENT     Patient location during evaluation: PACU Anesthesia Type: MAC Level of consciousness: awake and alert Pain management: pain level controlled Vital Signs Assessment: post-procedure vital signs reviewed and stable Respiratory status: spontaneous breathing, nonlabored ventilation, respiratory function stable and patient connected to nasal cannula oxygen Cardiovascular status: stable and blood pressure returned to baseline Postop Assessment: no apparent nausea or vomiting Anesthetic complications: no   No notable events documented.  Last Vitals:  Vitals:   06/14/23 1245 06/14/23 1255  BP: 91/68 110/81  Pulse: 84 88  Resp: 13 14  Temp:    SpO2: 97% 99%    Last Pain:  Vitals:   06/14/23 1255  TempSrc:   PainSc: 0-No pain                 Mariann Barter

## 2023-06-14 NOTE — Progress Notes (Signed)
 11 Days Post-Op Procedure(s) (LRB): PACEMAKER IMPLANT (N/A) Subjective:  Feels well this am. No stools overnight  Objective: Vital signs in last 24 hours: Temp:  [97.6 F (36.4 C)-99.1 F (37.3 C)] 98 F (36.7 C) (03/07 2320) Pulse Rate:  [81-108] 98 (03/08 0600) Cardiac Rhythm: Atrial fibrillation (03/07 2000) Resp:  [12-23] 17 (03/08 0700) BP: (88-134)/(46-85) 103/78 (03/08 0600) SpO2:  [81 %-100 %] 96 % (03/08 0600) Weight:  [87 kg] 87 kg (03/08 0439)  Hemodynamic parameters for last 24 hours:    Intake/Output from previous day: 03/07 0701 - 03/08 0700 In: 1943.3 [I.V.:1177.2; Blood:580; IV Piggyback:186.1] Out: 3700 [Urine:3100; Stool:600] Intake/Output this shift: No intake/output data recorded.  General appearance: alert and cooperative Neurologic: intact Heart: irregularly irregular rhythm Lungs: clear to auscultation bilaterally Abdomen: soft, non-tender; bowel sounds normal Extremities: minimal LE edema Wound: incision healing well  Lab Results: Recent Labs    06/13/23 0515 06/14/23 0225  WBC 10.2 10.5  HGB 8.3* 10.2*  HCT 24.9* 30.5*  PLT 207 196   BMET:  Recent Labs    06/13/23 1711 06/14/23 0225  NA 134* 137  K 3.1* 3.1*  CL 94* 101  CO2 21* 23  GLUCOSE 209* 102*  BUN 20 17  CREATININE 1.38* 1.26*  CALCIUM 6.8* 7.5*    PT/INR: No results for input(s): "LABPROT", "INR" in the last 72 hours. ABG    Component Value Date/Time   PHART 7.510 (H) 06/01/2023 2230   HCO3 32.8 (H) 06/01/2023 2230   TCO2 34 (H) 06/01/2023 2230   ACIDBASEDEF 5.0 (H) 05/27/2023 1859   O2SAT 94 06/01/2023 2230   CBG (last 3)  Recent Labs    06/13/23 2321 06/14/23 0650 06/14/23 0758  GLUCAP 110* 96 93    Assessment/Plan:  He has been hemodynamically stable. Rhythm continues to be atrial fib/flutter with reasonable rate control on IV amio. He has not converted though. Once GI bleeding issue resolved amio can be switch to po and may consider adding digoxin  or low dose beta blocker for better rate control. Preop EF 35-40% so probably avoid cardizem. May ultimately need DCCV but will have to be on anticoagulation for a while.  Hgb stable overnight and no further bloody stools. Planning colonoscopy today per GI  WBC back to normal on empiric Zosyn. Will continue through colonoscopy.  AKI resolved.  Continue PT. I think he would be a good candidate for CIR once GI bleeding resolved.    LOS: 18 days    Alleen Borne 06/14/2023

## 2023-06-14 NOTE — Transfer of Care (Signed)
 Immediate Anesthesia Transfer of Care Note  Patient: [name] [name]  Procedure(s) Performed: COLONOSCOPY BIOPSY,Colon TATTOOING, USING INTRADERMAL PIGMENT  Patient Location: PACU  Anesthesia Type:MAC  Level of Consciousness: sedated, drowsy, and patient cooperative  Airway & Oxygen Therapy: Patient Spontanous Breathing  Post-op Assessment: Report given to RN and Post -op Vital signs reviewed and stable  Post vital signs: Reviewed and stable  Last Vitals:  Vitals Value Taken Time  BP 90/68 06/14/23 1238  Temp 36.6 C 06/14/23 1238  Pulse 91 06/14/23 1238  Resp 13 06/14/23 1238  SpO2 96 % 06/14/23 1238    Last Pain:  Vitals:   06/14/23 1238  TempSrc: Temporal  PainSc:       Patients Stated Pain Goal: 3 (06/13/23 1200)  Complications: No notable events documented.

## 2023-06-14 NOTE — Progress Notes (Signed)
 06/14/2023 Chart and labs reviewed; For colonoscopy today Replete K ?need for DCCV eventually if can tolerate HD Will check on Monday if still in unit  Myrla Halsted MD PCCM

## 2023-06-14 NOTE — Interval H&P Note (Signed)
 History and Physical Interval Note: Patient here for colonoscopy today. States no bleeding with the prep and he thinks it has stopped. Hgb stable. Last colonoscopy > 20 years ago, he reported it was an incomplete exam, tortous colon, could not be completed. I discussed risks / benefits. WE will do our best to complete his exam and clarify source of bleeding. I will only remove polyps if we think it is the source of his bleeding to not confound his presentation. He understands and agrees, further recommendations pending the results.   06/14/2023 11:51 AM  Gaetano Net Shiffer  has presented today for surgery, with the diagnosis of GI bleed, anemia.  The various methods of treatment have been discussed with the patient and family. After consideration of risks, benefits and other options for treatment, the patient has consented to  Procedure(s): COLONOSCOPY (N/A) as a surgical intervention.  The patient's history has been reviewed, patient examined, no change in status, stable for surgery.  I have reviewed the patient's chart and labs.  Questions were answered to the patient's satisfaction.     Viviann Spare P Kindsey Eblin

## 2023-06-14 NOTE — Op Note (Signed)
 [facility] Patient Name: [name] [name] Procedure Date : 06/14/2023 MRN: [id number] Attending MD: Willaim Rayas. Adela Lank , MD, [id number] Date of Birth: 06-16-1945 CSN: [id number] Age: 78 Admit Type: Inpatient Procedure:                Colonoscopy Indications:              Rectal bleeding - recovering from cardiac surgery.                            Last colonoscopy reportedly > 20 years ago - he                            reports it was an incomplete exam at the time due                            to tortousity. Of note, patient states no further                            bleeding since bowel prep. Providers:                Willaim Rayas. Adela Lank, MD, Eliberto Ivory, RN, Marja Kays, Technician Referring MD:              Medicines:                Monitored Anesthesia Care Complications:            No immediate complications. Estimated blood loss:                            Minimal. Estimated Blood Loss:     Estimated blood loss was minimal. Procedure:                Pre-Anesthesia Assessment:                           - Prior to the procedure, a History and Physical                            was performed, and patient medications and                            allergies were reviewed. The patient's tolerance of                            previous anesthesia was also reviewed. The risks                            and benefits of the procedure and the sedation                            options and risks were discussed with the patient.  All questions were answered, and informed consent                            was obtained. Prior Anticoagulants: The patient has                            taken no anticoagulant or antiplatelet agents. ASA                            Grade Assessment: III - A patient with severe                            systemic disease. After reviewing the risks and                            benefits,  the patient was deemed in satisfactory                            condition to undergo the procedure.                           After obtaining informed consent, the colonoscope                            was passed under direct vision. Throughout the                            procedure, the patient's blood pressure, pulse, and                            oxygen saturations were monitored continuously. The                            CF-HQ190L ([id number] Olympus coloscope was                            introduced through the anus and advanced to the the                            cecum, identified by appendiceal orifice and                            ileocecal valve. The colonoscopy was performed                            without difficulty. The patient tolerated the                            procedure well. The quality of the bowel                            preparation was adequate. The ileocecal valve,  appendiceal orifice, and rectum were photographed. Scope In: 12:07:08 PM Scope Out: 12:31:56 PM Scope Withdrawal Time: 0 hours 17 minutes 2 seconds  Total Procedure Duration: 0 hours 24 minutes 48 seconds  Findings:      Hemorrhoids were found on perianal exam.      A few small-mouthed diverticula were found in the left colon and right       colon. No stigmata of bleeding noted.      Multiple sessile polyps were found in the descending colon, transverse       colon and cecum. The polyps were small in size (< 1cm) and not removed       given recent bleeding symptoms.      Patchy inflammation characterized by linear ulcerations was found in the       proximal transverse colon and at the hepatic flexure. This was grossly       consistent with ischemic colitis. Biopsies were taken with a cold       forceps for histology.      A large bulky polypoid was found in the distal transverse colon. The       polyp was sessile and villous appearing. Biopsies were taken  with a cold       forceps for histology. No obvious ulceration or stigmata of bleeding       noted. Area just distal to and across the lumen from the lesion was       tattooed with an injection of Spot (carbon black).      The colon was tortuous. Abdominal pressure was utilized to achieve cecal       intubation.      Internal hemorrhoids were found during retroflexion.      The exam was otherwise without abnormality. No active bleeding noted       during the exam. Impression:               - Hemorrhoids found on perianal exam.                           - Diverticulosis in the left colon and in the right                            colon.                           - Multiple small polyps in the descending colon, in                            the transverse colon and in the cecum.                           - Patchy inflammation was found in the proximal                            transverse colon and at the hepatic flexure                            secondary to ischemic colitis. Biopsied.                           -  One large polypoid lesion in the distal                            transverse colon as outlined. Biopsied. Tattooed.                           - Tortuous colon.                           - Internal hemorrhoids.                           - The examination was otherwise normal.                           Overall, suspect ischemic colitis is the likely                            cause of his bleeding and hopefully improves with                            supportive measures. Possible large polylpoid                            lesion could be related but less likely, no                            stigmata for bleeding noted. I did not attempt to                            remove this today - biopsied to exclude malignancy.                            Will review his case with advanced endoscopy, would                            rather attempt removal as outpatient once recovered                             from his acute illness if not thought to be causing                            his acute bleeding. Recommendation:           - Return patient to hospital [place] for ongoing care.                           - Advance diet as tolerated.                           - Continue present medications.                           - Continue supportive measures - avoid hypotension                           -  Await pathology results.                           - Will reassess him tomorrow, call with questions                            in the interim. Procedure Code(s):        --- Professional ---                           [phone], Colonoscopy, flexible; with biopsy, single                            or multiple                           [id number], Colonoscopy, flexible; with directed                            submucosal injection(s), any substance Diagnosis Code(s):        --- Professional ---                           K64.8, Other hemorrhoids                           D12.4, Benign neoplasm of descending colon                           D12.3, Benign neoplasm of transverse colon (hepatic                            flexure or splenic flexure)                           D12.0, Benign neoplasm of cecum                           K55.9, Vascular disorder of intestine, unspecified                           K62.5, Hemorrhage of anus and rectum                           K57.30, Diverticulosis of large intestine without                            perforation or abscess without bleeding CPT copyright 2022 American Medical Association. All rights reserved. The codes documented in this report are preliminary and upon coder review may  be revised to meet current compliance requirements. Viviann Spare P. Clarity Ciszek, MD 06/14/2023 12:49:43 PM This report has been signed electronically. Number of Addenda: 0

## 2023-06-14 NOTE — Anesthesia Preprocedure Evaluation (Addendum)
 Anesthesia Evaluation  Patient identified by MRN, date of birth, ID band Patient awake    Reviewed: Allergy & Precautions, NPO status , Patient's Chart, lab work & pertinent test results, reviewed documented beta blocker date and time   History of Anesthesia Complications Negative for: history of anesthetic complications  Airway Mallampati: II  TM Distance: >3 FB     Dental no notable dental hx.    Pulmonary    breath sounds clear to auscultation       Cardiovascular hypertension, + dysrhythmias + pacemaker + Valvular Problems/Murmurs  Rhythm:Irregular Rate:Normal  Recent MVR/TVR, last TEE with LVEF 35-40, mild RV dysfunction, normal prosthetic MV function, residual mild TR   Neuro/Psych  Neuromuscular disease CVA    GI/Hepatic   Endo/Other    Renal/GU Renal disease     Musculoskeletal  (+) Arthritis ,    Abdominal   Peds  Hematology  (+) Blood dyscrasia, anemia   Anesthesia Other Findings   Reproductive/Obstetrics                             Anesthesia Physical Anesthesia Plan  ASA: 3  Anesthesia Plan: MAC   Post-op Pain Management:    Induction: Intravenous  PONV Risk Score and Plan: 2 and Ondansetron  Airway Management Planned: Natural Airway and Nasal Cannula  Additional Equipment:   Intra-op Plan:   Post-operative Plan:   Informed Consent: I have reviewed the patients History and Physical, chart, labs and discussed the procedure including the risks, benefits and alternatives for the proposed anesthesia with the patient or authorized representative who has indicated his/her understanding and acceptance.     Dental advisory given  Plan Discussed with: CRNA  Anesthesia Plan Comments:         Anesthesia Quick Evaluation

## 2023-06-14 NOTE — Progress Notes (Addendum)
 Inpatient Rehab Admissions Coordinator:  Consult received. Pt off the unit for a procedure. Will f/u at later date.   ADDENDUM: Met with pt at bedside to discuss CIR. Discussed average length of stay, insurance authorization requirement and discharge home after completion of CIR. Pt acknowledged understanding. Pt is interested in pursuing CIR. Pt gave permission to contact daughter Selena Batten. Spoke with Selena Batten on the telephone. She also acknowledged understanding of CIR goals and expectations. She is supportive of pt pursuing CIR. She confirmed that she and other family members will be able to provide 24/7 support for pt after discharge. Will continue to follow.  Wolfgang Phoenix, MS, CCC-SLP Admissions Coordinator [phone]

## 2023-06-14 NOTE — Progress Notes (Signed)
 Patient ID: [name] [name], male   DOB: Apr 14, 1945, 78 y.o.   MRN: [id number]  TCTS Evening Rounds:  Hemodynamically stable.   Ambulating in hall with nurse.  Colonoscopy today showed no active bleeding but what looked like healing ischemic colitis in transverse colon and hepatic flexure. Some polyps including a larger sessile polyp that was biopsied. Some diverticuli.   Started back on liquid diet and will advance as tolerated.

## 2023-06-14 NOTE — Progress Notes (Deleted)
 Inpatient Rehab Admissions:  Inpatient Rehab Consult received.  I met with pt, pt's wife Shelby Dubin, and pt's father-in-law at the bedside for rehabilitation assessment and to discuss goals and expectations of an inpatient rehab admission.  Pt was lethargic so spoke with pt's wife and father-in-law. Discussed average length of stay, insurance authorization requirement and discharge home after completion of CIR. [name] wife and father-in-law acknowledged understanding. They are supportive of pt pursuing CIR. [name] wife and father-in-law confirmed that they will be able to provide 24/7 support for pt after discharge. Will continue to follow.  Signed: Wolfgang Phoenix, MS, CCC-SLP Admissions Coordinator [phone]

## 2023-06-14 NOTE — Plan of Care (Signed)
  Problem: Education: Goal: Knowledge of General Education information will improve Description: Including pain rating scale, medication(s)/side effects and non-pharmacologic comfort measures Outcome: Progressing   Problem: Health Behavior/Discharge Planning: Goal: Ability to manage health-related needs will improve Outcome: Progressing   Problem: Clinical Measurements: Goal: Ability to maintain clinical measurements within normal limits will improve Outcome: Progressing Goal: Will remain free from infection Outcome: Progressing Goal: Diagnostic test results will improve Outcome: Progressing Goal: Respiratory complications will improve Outcome: Progressing Goal: Cardiovascular complication will be avoided Outcome: Progressing   Problem: Activity: Goal: Risk for activity intolerance will decrease Outcome: Progressing   Problem: Nutrition: Goal: Adequate nutrition will be maintained Outcome: Progressing   Problem: Coping: Goal: Level of anxiety will decrease Outcome: Progressing   Problem: Elimination: Goal: Will not experience complications related to bowel motility Outcome: Progressing Goal: Will not experience complications related to urinary retention Outcome: Progressing   Problem: Pain Managment: Goal: General experience of comfort will improve and/or be controlled Outcome: Progressing   Problem: Safety: Goal: Ability to remain free from injury will improve Outcome: Progressing   Problem: Skin Integrity: Goal: Risk for impaired skin integrity will decrease Outcome: Progressing   Problem: Education: Goal: Will demonstrate proper wound care and an understanding of methods to prevent future damage Outcome: Progressing Goal: Knowledge of disease or condition will improve Outcome: Progressing Goal: Knowledge of the prescribed therapeutic regimen will improve Outcome: Progressing   Problem: Activity: Goal: Risk for activity intolerance will decrease Outcome:  Progressing   Problem: Cardiac: Goal: Will achieve and/or maintain hemodynamic stability Outcome: Progressing   Problem: Clinical Measurements: Goal: Postoperative complications will be avoided or minimized Outcome: Progressing   Problem: Respiratory: Goal: Respiratory status will improve Outcome: Progressing   Problem: Skin Integrity: Goal: Wound healing without signs and symptoms of infection Outcome: Progressing Goal: Risk for impaired skin integrity will decrease Outcome: Progressing   Problem: Urinary Elimination: Goal: Ability to achieve and maintain adequate renal perfusion and functioning will improve Outcome: Progressing   Problem: Education: Goal: Knowledge of cardiac device and self-care will improve Outcome: Progressing Goal: Ability to safely manage health related needs after discharge will improve Outcome: Progressing Goal: Individualized Educational Video(s) Outcome: Progressing   Problem: Cardiac: Goal: Ability to achieve and maintain adequate cardiopulmonary perfusion will improve Outcome: Progressing   Problem: Education: Goal: Ability to describe self-care measures that may prevent or decrease complications (Diabetes Survival Skills Education) will improve Outcome: Progressing Goal: Individualized Educational Video(s) Outcome: Progressing   Problem: Coping: Goal: Ability to adjust to condition or change in health will improve Outcome: Progressing   Problem: Fluid Volume: Goal: Ability to maintain a balanced intake and output will improve Outcome: Progressing   Problem: Health Behavior/Discharge Planning: Goal: Ability to identify and utilize available resources and services will improve Outcome: Progressing Goal: Ability to manage health-related needs will improve Outcome: Progressing   Problem: Metabolic: Goal: Ability to maintain appropriate glucose levels will improve Outcome: Progressing   Problem: Nutritional: Goal: Maintenance of  adequate nutrition will improve Outcome: Progressing Goal: Progress toward achieving an optimal weight will improve Outcome: Progressing   Problem: Skin Integrity: Goal: Risk for impaired skin integrity will decrease Outcome: Progressing   Problem: Tissue Perfusion: Goal: Adequacy of tissue perfusion will improve Outcome: Progressing

## 2023-06-14 NOTE — Anesthesia Procedure Notes (Signed)
 Procedure Name: MAC Date/Time: 06/14/2023 12:03 PM  Performed by: Drema Pry, CRNAPre-anesthesia Checklist: Patient identified, Emergency Drugs available, Suction available and Patient being monitored Oxygen Delivery Method: Simple face mask

## 2023-06-15 DIAGNOSIS — Z860100 Personal history of colon polyps, unspecified: Secondary | ICD-10-CM

## 2023-06-15 DIAGNOSIS — K559 Vascular disorder of intestine, unspecified: Secondary | ICD-10-CM

## 2023-06-15 LAB — BASIC METABOLIC PANEL
Anion gap: 11 (ref 5–15)
BUN: 15 mg/dL (ref 8–23)
CO2: 22 mmol/L (ref 22–32)
Calcium: 7.5 mg/dL — ABNORMAL LOW (ref 8.9–10.3)
Chloride: 102 mmol/L (ref 98–111)
Creatinine, Ser: 1.13 mg/dL (ref 0.61–1.24)
GFR, Estimated: >60 mL/min (ref >60)
Glucose, Bld: 240 mg/dL — ABNORMAL HIGH (ref 70–99)
Potassium: 3.4 mmol/L — ABNORMAL LOW (ref 3.5–5.1)
Sodium: 135 mmol/L (ref 135–145)

## 2023-06-15 LAB — GLUCOSE, CAPILLARY
Glucose-Capillary: 126 mg/dL — ABNORMAL HIGH (ref 70–99)
Glucose-Capillary: 99 mg/dL (ref 70–99)
Glucose-Capillary: 93 mg/dL (ref 70–99)

## 2023-06-15 LAB — CBC
HCT: 31.4 % — ABNORMAL LOW (ref 39.0–52.0)
Hemoglobin: 10.3 g/dL — ABNORMAL LOW (ref 13.0–17.0)
MCH: 28.8 pg (ref 26.0–34.0)
MCHC: 32.8 g/dL (ref 30.0–36.0)
MCV: 87.7 fL (ref 80.0–100.0)
Platelets: 198 10*3/uL (ref 150–400)
RBC: 3.58 MIL/uL — ABNORMAL LOW (ref 4.22–5.81)
RDW: 16 % — ABNORMAL HIGH (ref 11.5–15.5)
WBC: 10.7 10*3/uL — ABNORMAL HIGH (ref 4.0–10.5)
nRBC: 0 % (ref 0.0–0.2)

## 2023-06-15 LAB — MAGNESIUM: Magnesium: 2 mg/dL (ref 1.7–2.4)

## 2023-06-15 LAB — PHOSPHORUS: Phosphorus: 2.3 mg/dL — ABNORMAL LOW (ref 2.5–4.6)

## 2023-06-15 MED ORDER — SODIUM CHLORIDE 0.9% FLUSH: 3.0000 mL | INTRAVENOUS | Status: DC | PRN

## 2023-06-15 MED ORDER — SODIUM CHLORIDE 0.9 % IV SOLN: 250.0000 mL | INTRAVENOUS | Status: AC | PRN

## 2023-06-15 MED ORDER — AMIODARONE HCL 200 MG PO TABS
400.0000 mg | ORAL_TABLET | Freq: Two times a day (BID) | ORAL | Status: DC
  Administered 2023-06-15 – 2023-06-18 (×7): 400 mg via ORAL
  Filled 2023-06-15 (×7): qty 2

## 2023-06-15 MED ORDER — POTASSIUM CHLORIDE CRYS ER 20 MEQ PO TBCR
20.0000 meq | EXTENDED_RELEASE_TABLET | ORAL | Status: AC
  Administered 2023-06-15 (×3): 20 meq via ORAL
  Filled 2023-06-15 (×3): qty 1

## 2023-06-15 MED ORDER — ~~LOC~~ CARDIAC SURGERY, PATIENT & FAMILY EDUCATION: Freq: Once | Status: AC

## 2023-06-15 MED ORDER — SODIUM CHLORIDE 0.9% FLUSH
3.0000 mL | Freq: Two times a day (BID) | INTRAVENOUS | Status: DC
  Administered 2023-06-15 – 2023-06-18 (×7): 3 mL via INTRAVENOUS

## 2023-06-15 MED ORDER — FUROSEMIDE 40 MG PO TABS
40.0000 mg | ORAL_TABLET | Freq: Every day | ORAL | Status: DC
  Administered 2023-06-15 – 2023-06-16 (×2): 40 mg via ORAL
  Filled 2023-06-15 (×2): qty 1

## 2023-06-15 MED ORDER — POTASSIUM CHLORIDE CRYS ER 20 MEQ PO TBCR
40.0000 meq | EXTENDED_RELEASE_TABLET | ORAL | Status: AC
  Administered 2023-06-15 (×2): 40 meq via ORAL
  Filled 2023-06-15 (×2): qty 2

## 2023-06-15 NOTE — Progress Notes (Signed)
      Progress Note   Subjective  Patient has had no further bleeding. Hgb stable post colonoscopy   Objective   Vital signs in last 24 hours: Temp:  [97.6 F (36.4 C)-98.2 F (36.8 C)] 98.1 F (36.7 C) (03/09 0730) Pulse Rate:  [77-195] 92 (03/09 0700) Resp:  [12-28] 19 (03/09 0700) BP: (87-116)/(57-84) 87/66 (03/09 0700) SpO2:  [78 %-99 %] 95 % (03/09 0700) Weight:  [87.3 kg] 87.3 kg (03/09 0500) Last BM Date : 06/15/23 General:    white male in NAD Neurologic:  Alert and oriented,  grossly normal neurologically. Psych:  Cooperative. Normal mood and affect.  Intake/Output from previous day: 03/08 0701 - 03/09 0700 In: 971.1 [I.V.:836.9; IV Piggyback:134.1] Out: 350 [Urine:350] Intake/Output this shift: Total I/O In: 240 [P.O.:240] Out: -   Lab Results: Recent Labs    06/13/23 0515 06/14/23 0225 06/15/23 0329  WBC 10.2 10.5 10.7*  HGB 8.3* 10.2* 10.3*  HCT 24.9* 30.5* 31.4*  PLT 207 196 198   BMET Recent Labs    06/13/23 1711 06/14/23 0225 06/15/23 0329  NA 134* 137 135  K 3.1* 3.1* 3.4*  CL 94* 101 102  CO2 21* 23 22  GLUCOSE 209* 102* 240*  BUN 20 17 15   CREATININE 1.38* 1.26* 1.13  CALCIUM 6.8* 7.5* 7.5*   LFT No results for input(s): "PROT", "ALBUMIN", "AST", "ALT", "ALKPHOS", "BILITOT", "BILIDIR", "IBILI" in the last 72 hours. PT/INR No results for input(s): "LABPROT", "INR" in the last 72 hours.  Studies/Results: No results found.     Assessment / Plan:    78 y/o male with the following:  Lower GI bleed Ischemic colitis Colon polyps MR and TR s/p MVR and TV repair AF Anticoagulated  Colonoscopy yesterday showed suspected ischemic colitis in the hepatic flexure / proximal transverse colon as cause of bleeding. Colon polyps noted, not removed, one very large lesion in the transverse colon. I suspect this is benign (villous appearing), but possible there could be malignant change within it. Biopsied to assess for superficial  malignancy, not removed given increased risk for bleeding and perforation with a large lesion like this, in the setting of recent bleeding from another cause (I suspect ischemic colitis is the likely dx, polyp did not appear to have stigmata for bleeding). Pathology should be back in next 24-48 hours.  I think okay to resume anticoagulation in the next 1-2 days or so and monitor for recurrent bleeding. Hopefully he continues to improve, Hgb is stable. He will need outpatient removal of this large polypoid lesion, will discuss with my advance endoscopy colleague, perhaps once he recovers from his more acute cardiac issues this could be attempted outpatient in the next 1-2 months. We can coordinate outpatient follow up for him once out of the hospital.  Call with questions, we will sign off for now. We will follow up pathology results otherwise once they return.   Harlin Rain, MD [facility]

## 2023-06-15 NOTE — Plan of Care (Signed)

## 2023-06-15 NOTE — Progress Notes (Addendum)
 [address]       [place] [address]             [phone]    1 Day Post-Op Procedure(s) (LRB): COLONOSCOPY (N/A) BIOPSY,Colon TATTOOING, USING INTRADERMAL PIGMENT Subjective: The patient states this is the best he has felt since he got here. He is sitting up in the bed eating chicken noodle soup.  Objective: Vital signs in last 24 hours: Temp:  [97.6 F (36.4 C)-98.2 F (36.8 C)] 97.9 F (36.6 C) (03/09 1130) Pulse Rate:  [77-195] 92 (03/09 0700) Cardiac Rhythm: Atrial fibrillation (03/09 0800) Resp:  [12-28] 19 (03/09 0700) BP: (87-112)/(57-81) 87/66 (03/09 0700) SpO2:  [78 %-99 %] 95 % (03/09 0700) Weight:  [87.3 kg] 87.3 kg (03/09 0500)  Hemodynamic parameters for last 24 hours:    Intake/Output from previous day: 03/08 0701 - 03/09 0700 In: 971.1 [I.V.:836.9; IV Piggyback:134.1] Out: 350 [Urine:350] Intake/Output this shift: Total I/O In: 489.1 [P.O.:240; I.V.:199.1; IV Piggyback:50] Out: 150 [Urine:150]  General appearance: alert, cooperative, and no distress Neurologic: intact Heart: atrial fibrillation, HR controlled, no murmur Lungs: slightly diminished bibasilar breath sounds Abdomen: soft, non-tender; bowel sounds normal; no masses,  no organomegaly Extremities: edema 1+ BLE Wound: Clean and dry without sign of infection  Lab Results: Recent Labs    06/14/23 0225 06/15/23 0329  WBC 10.5 10.7*  HGB 10.2* 10.3*  HCT 30.5* 31.4*  PLT 196 198   BMET:  Recent Labs    06/14/23 0225 06/15/23 0329  NA 137 135  K 3.1* 3.4*  CL 101 102  CO2 23 22  GLUCOSE 102* 240*  BUN 17 15  CREATININE 1.26* 1.13  CALCIUM 7.5* 7.5*    PT/INR: No results for input(s): "LABPROT", "INR" in the last 72 hours. ABG    Component Value Date/Time   PHART 7.510 (H) 06/01/2023 2230   HCO3 32.8 (H) 06/01/2023 2230   TCO2 34 (H) 06/01/2023 2230   ACIDBASEDEF 5.0 (H) 05/27/2023 1859   O2SAT 94 06/01/2023 2230   CBG (last 3)  Recent Labs     06/14/23 1543 06/15/23 0752 06/15/23 1126  GLUCAP 101* 126* 99    Assessment/Plan: S/P Procedure(s) (LRB): COLONOSCOPY (N/A) BIOPSY,Colon TATTOOING, USING INTRADERMAL PIGMENT  CV: Postoperative atrial fibrillation. Afib, HR 80s-102 on IV Amiodarone this AM. Soft BP, SBP 90s-102  Pulm: Saturating well on RA. Encourage IS and ambulation  GI: Colonoscopy yesterday showed suspected ischemic coitis as the cause of bleeding. One large lesion in the transverse colon was biopsied but will require removal as an outpatient. No signs of further GI bleed, GI has signed off. Patient is tolerating a heart healthy diet.  Endo: CBGs controlled 101/126/99 on SSI  Renal: Cr 1.13, improved. UO 350cc/24hrs recorded. Under preop weight but +6lbs from lowest weight. Edematous on exam, will ad PO Lasix 40mg . K 3.4, supplement.   ID: Likely reactive leukocytosis overall stable, WBC 10.7. Afebrile, clinically monitor.   Expected postop ABLA/anemia due to GI beed: H/H 10.3/31.4, stable. No further signs of GI bleed. Will closely monitor.  DVT Prophylaxis: Held due to GI bleed  Anticoagulation: Eliquis on hold due to GI bleed. Per GI ok to restart next 1-2days. Will defer to Dr. Leafy Ro tomorrow.  Dispo: Plan to transfer to 4E   LOS: 19 days    Jenny Reichmann, PA-C 06/15/2023   Chart reviewed, patient examined, agree with above.  He feels well and looking forward to CIR if he can  go.  Hemodynamics stable in atrial fib with controlled rate. Switch amio to po. Eating well today. Had a couple BM's with blood. Hgb stable.

## 2023-06-15 NOTE — Plan of Care (Signed)
  Problem: Education: Goal: Knowledge of General Education information will improve Description: Including pain rating scale, medication(s)/side effects and non-pharmacologic comfort measures Outcome: Progressing   Problem: Health Behavior/Discharge Planning: Goal: Ability to manage health-related needs will improve Outcome: Progressing   Problem: Clinical Measurements: Goal: Ability to maintain clinical measurements within normal limits will improve Outcome: Progressing Goal: Will remain free from infection Outcome: Progressing Goal: Diagnostic test results will improve Outcome: Progressing Goal: Respiratory complications will improve Outcome: Progressing Goal: Cardiovascular complication will be avoided Outcome: Progressing   Problem: Activity: Goal: Risk for activity intolerance will decrease Outcome: Progressing   Problem: Nutrition: Goal: Adequate nutrition will be maintained Outcome: Progressing   Problem: Coping: Goal: Level of anxiety will decrease Outcome: Progressing   Problem: Elimination: Goal: Will not experience complications related to bowel motility Outcome: Progressing Goal: Will not experience complications related to urinary retention Outcome: Progressing   Problem: Pain Managment: Goal: General experience of comfort will improve and/or be controlled Outcome: Progressing   Problem: Safety: Goal: Ability to remain free from injury will improve Outcome: Progressing   Problem: Skin Integrity: Goal: Risk for impaired skin integrity will decrease Outcome: Progressing   Problem: Education: Goal: Will demonstrate proper wound care and an understanding of methods to prevent future damage Outcome: Progressing Goal: Knowledge of disease or condition will improve Outcome: Progressing Goal: Knowledge of the prescribed therapeutic regimen will improve Outcome: Progressing   Problem: Activity: Goal: Risk for activity intolerance will decrease Outcome:  Progressing   Problem: Cardiac: Goal: Will achieve and/or maintain hemodynamic stability Outcome: Progressing   Problem: Clinical Measurements: Goal: Postoperative complications will be avoided or minimized Outcome: Progressing   Problem: Respiratory: Goal: Respiratory status will improve Outcome: Progressing   Problem: Skin Integrity: Goal: Wound healing without signs and symptoms of infection Outcome: Progressing Goal: Risk for impaired skin integrity will decrease Outcome: Progressing   Problem: Urinary Elimination: Goal: Ability to achieve and maintain adequate renal perfusion and functioning will improve Outcome: Progressing   Problem: Education: Goal: Knowledge of cardiac device and self-care will improve Outcome: Progressing Goal: Ability to safely manage health related needs after discharge will improve Outcome: Progressing Goal: Individualized Educational Video(s) Outcome: Progressing   Problem: Cardiac: Goal: Ability to achieve and maintain adequate cardiopulmonary perfusion will improve Outcome: Progressing   Problem: Education: Goal: Ability to describe self-care measures that may prevent or decrease complications (Diabetes Survival Skills Education) will improve Outcome: Progressing Goal: Individualized Educational Video(s) Outcome: Progressing   Problem: Coping: Goal: Ability to adjust to condition or change in health will improve Outcome: Progressing   Problem: Fluid Volume: Goal: Ability to maintain a balanced intake and output will improve Outcome: Progressing   Problem: Health Behavior/Discharge Planning: Goal: Ability to identify and utilize available resources and services will improve Outcome: Progressing Goal: Ability to manage health-related needs will improve Outcome: Progressing   Problem: Metabolic: Goal: Ability to maintain appropriate glucose levels will improve Outcome: Progressing   Problem: Nutritional: Goal: Maintenance of  adequate nutrition will improve Outcome: Progressing Goal: Progress toward achieving an optimal weight will improve Outcome: Progressing   Problem: Skin Integrity: Goal: Risk for impaired skin integrity will decrease Outcome: Progressing   Problem: Tissue Perfusion: Goal: Adequacy of tissue perfusion will improve Outcome: Progressing

## 2023-06-16 ENCOUNTER — Encounter (HOSPITAL_COMMUNITY): Payer: Self-pay | Admitting: Gastroenterology

## 2023-06-16 ENCOUNTER — Inpatient Hospital Stay (HOSPITAL_COMMUNITY)

## 2023-06-16 DIAGNOSIS — D62 Acute posthemorrhagic anemia: Secondary | ICD-10-CM | POA: Diagnosis not present

## 2023-06-16 DIAGNOSIS — R5381 Other malaise: Secondary | ICD-10-CM

## 2023-06-16 DIAGNOSIS — Z9889 Other specified postprocedural states: Secondary | ICD-10-CM | POA: Diagnosis not present

## 2023-06-16 LAB — CBC
HCT: 28.5 % — ABNORMAL LOW (ref 39.0–52.0)
Hemoglobin: 9.3 g/dL — ABNORMAL LOW (ref 13.0–17.0)
MCH: 29 pg (ref 26.0–34.0)
MCHC: 32.6 g/dL (ref 30.0–36.0)
MCV: 88.8 fL (ref 80.0–100.0)
Platelets: 166 10*3/uL (ref 150–400)
RBC: 3.21 MIL/uL — ABNORMAL LOW (ref 4.22–5.81)
RDW: 15.6 % — ABNORMAL HIGH (ref 11.5–15.5)
WBC: 8.9 10*3/uL (ref 4.0–10.5)
nRBC: 0 % (ref 0.0–0.2)

## 2023-06-16 LAB — PHOSPHORUS: Phosphorus: 2.4 mg/dL — ABNORMAL LOW (ref 2.5–4.6)

## 2023-06-16 LAB — MAGNESIUM: Magnesium: 1.7 mg/dL (ref 1.7–2.4)

## 2023-06-16 LAB — BASIC METABOLIC PANEL
Anion gap: 23 — ABNORMAL HIGH (ref 5–15)
BUN: 12 mg/dL (ref 8–23)
CO2: 19 mmol/L — ABNORMAL LOW (ref 22–32)
Calcium: 6.5 mg/dL — ABNORMAL LOW (ref 8.9–10.3)
Chloride: 97 mmol/L — ABNORMAL LOW (ref 98–111)
Creatinine, Ser: 1.25 mg/dL — ABNORMAL HIGH (ref 0.61–1.24)
GFR, Estimated: 58 mL/min — ABNORMAL LOW (ref >60)
Glucose, Bld: 127 mg/dL — ABNORMAL HIGH (ref 70–99)
Potassium: 3.3 mmol/L — ABNORMAL LOW (ref 3.5–5.1)
Sodium: 139 mmol/L (ref 135–145)

## 2023-06-16 LAB — GLUCOSE, CAPILLARY
Glucose-Capillary: 134 mg/dL — ABNORMAL HIGH (ref 70–99)
Glucose-Capillary: 73 mg/dL (ref 70–99)

## 2023-06-16 MED ORDER — ASPIRIN 81 MG PO TBEC
81.0000 mg | DELAYED_RELEASE_TABLET | Freq: Every day | ORAL | Status: DC
Start: 2023-06-16 — End: 2023-06-18
  Administered 2023-06-16 – 2023-06-18 (×3): 81 mg via ORAL
  Filled 2023-06-16 (×3): qty 1

## 2023-06-16 MED ORDER — SODIUM CHLORIDE (PF) 0.9 % IJ SOLN
INTRAMUSCULAR | Status: AC
  Administered 2023-06-16: 10 mL
  Filled 2023-06-16: qty 10

## 2023-06-16 MED ORDER — POTASSIUM CHLORIDE CRYS ER 20 MEQ PO TBCR
30.0000 meq | EXTENDED_RELEASE_TABLET | Freq: Three times a day (TID) | ORAL | Status: AC
  Administered 2023-06-16 (×3): 30 meq via ORAL
  Filled 2023-06-16 (×3): qty 1

## 2023-06-16 MED ORDER — DIGOXIN 125 MCG PO TABS
0.2500 mg | ORAL_TABLET | Freq: Every day | ORAL | Status: DC
  Administered 2023-06-16: 0.25 mg via ORAL
  Filled 2023-06-16: qty 2

## 2023-06-16 MED ORDER — DIGOXIN 125 MCG PO TABS
0.1250 mg | ORAL_TABLET | Freq: Every day | ORAL | Status: DC
  Administered 2023-06-17 – 2023-06-18 (×2): 0.125 mg via ORAL
  Filled 2023-06-16 (×2): qty 1

## 2023-06-16 MED ORDER — FE FUM-VIT C-VIT B12-FA 460-60-0.01-1 MG PO CAPS
1.0000 | ORAL_CAPSULE | Freq: Every day | ORAL | Status: DC
  Administered 2023-06-16 – 2023-06-18 (×3): 1 via ORAL
  Filled 2023-06-16 (×3): qty 1

## 2023-06-16 MED ORDER — MAGNESIUM SULFATE 2 GM/50ML IV SOLN
2.0000 g | Freq: Once | INTRAVENOUS | Status: AC
  Administered 2023-06-16: 2 g via INTRAVENOUS
  Filled 2023-06-16: qty 50

## 2023-06-16 MED ORDER — DIGOXIN 125 MCG PO TABS
0.2500 mg | ORAL_TABLET | Freq: Once | ORAL | Status: AC
  Administered 2023-06-16: 0.25 mg via ORAL
  Filled 2023-06-16: qty 2

## 2023-06-16 NOTE — Progress Notes (Signed)
 Physical Therapy Treatment Patient Details Name: [name] [name] MRN: [id number] DOB: September 22, 1945 Today's Date: 06/16/2023   History of Present Illness 78 y/o male admitted 2/18 for mitral and tricuspid valve repair, MAZE same date. 2/25 DCCV, PPM placed.  3/3 pt transferred out of ICU with readmit on 3/3 due to GI bleed, a-fib RVR, hemorrhagic shock, and hypotension. PMH: Mitral regurgitation, paroxysmal Afib, CVA, HTN, HLD, Prostate CA, glaucoma    PT Comments  Pt up in chair on arrival, eager for mobility as pt reporting discomfort at bottom while sitting up in chair. RN present and to order geo-mat for chair. Pt requiring mod A to boost to stand and facilitate anterior translation, pt with good eccentric control to lower to sitting at end of session. Pt demonstrating gait with RW support at grossly CGA level with no overt LOB, however continues to fatigue quickly, needing x1 standing rest due to fatigue. Patient will benefit from continued inpatient follow up therapy, <3 hours/day to maximize functional independence. Will continue to follow acutely.     If plan is discharge home, recommend the following: A little help with walking and/or transfers;A little help with bathing/dressing/bathroom;Assistance with cooking/housework;Assist for transportation;Help with stairs or ramp for entrance   Can travel by private vehicle     No  Equipment Recommendations  Rolling walker (2 wheels);BSC/3in1    Recommendations for Other Services       Precautions / Restrictions Precautions Precautions: Sternal;Fall;ICD/Pacemaker Precaution Booklet Issued: Yes (comment) Recall of Precautions/Restrictions: Intact Precaution/Restrictions Comments: able to recall precautions Restrictions Weight Bearing Restrictions Per Provider Order: Yes RUE Weight Bearing Per Provider Order: Non weight bearing LUE Weight Bearing Per Provider Order: Non weight bearing Other Position/Activity Restrictions: sternal  precautions/ICD/pacemaker precautions     Mobility  Bed Mobility Overal bed mobility: Needs Assistance Bed Mobility: Rolling, Sit to Sidelying Rolling: Min assist       Sit to sidelying: Min assist General bed mobility comments: cues for precautions and min A to manage LEs into bed    Transfers Overall transfer level: Needs assistance Equipment used: None Transfers: Sit to/from Stand, Bed to chair/wheelchair/BSC Sit to Stand: Mod assist           General transfer comment: mod A to boost to stand with pt utilizing heart pillow at chest, good eccentric control when coming to sit    Ambulation/Gait Ambulation/Gait assistance: Contact guard assist Gait Distance (Feet): 125 Feet Assistive device: Rolling walker (2 wheels) Gait Pattern/deviations: Step-through pattern, Decreased stride length Gait velocity: decr     General Gait Details: cues for larger strides as pt with tendecy for short shuffling steps initially, able to correct   Stairs             Wheelchair Mobility     Tilt Bed    Modified Rankin (Stroke Patients Only)       Balance Overall balance assessment: Needs assistance Sitting-balance support: No upper extremity supported, Feet supported Sitting balance-Leahy Scale: Fair     Standing balance support: Single extremity supported, During functional activity Standing balance-Leahy Scale: Poor Standing balance comment: realint on UE support for dynamic tasks                            Communication Communication Communication: No apparent difficulties Factors Affecting Communication: Other (comment) (soft voice)  Cognition Arousal: Alert Behavior During Therapy: WFL for tasks assessed/performed  Following commands: Intact      Cueing Cueing Techniques: Verbal cues, Visual cues  Exercises      General Comments General comments (skin integrity, edema, etc.): VSS on RA       Pertinent Vitals/Pain Pain Assessment Pain Assessment: Faces Faces Pain Scale: Hurts little more Pain Location: buttocks seated in recliner Pain Descriptors / Indicators: Aching, Discomfort, Sore Pain Intervention(s): Monitored during session, Limited activity within patient's tolerance, Repositioned    Home Living                          Prior Function            PT Goals (current goals can now be found in the care plan section) Acute Rehab PT Goals PT Goal Formulation: With patient/family Time For Goal Achievement: 06/19/23 Progress towards PT goals: Progressing toward goals    Frequency    Min 3X/week      PT Plan      Co-evaluation              AM-PAC PT "6 Clicks" Mobility   Outcome Measure  Help needed turning from your back to your side while in a flat bed without using bedrails?: A Lot Help needed moving from lying on your back to sitting on the side of a flat bed without using bedrails?: A Lot Help needed moving to and from a bed to a chair (including a wheelchair)?: A Lot Help needed standing up from a chair using your arms (e.g., wheelchair or bedside chair)?: A Lot Help needed to walk in hospital room?: A Little Help needed climbing 3-5 steps with a railing? : Total 6 Click Score: 12    End of Session Equipment Utilized During Treatment: Gait belt Activity Tolerance: Patient tolerated treatment well Patient left: in bed;with call bell/phone within reach Nurse Communication: Mobility status PT Visit Diagnosis: Other abnormalities of gait and mobility (R26.89);Difficulty in walking, not elsewhere classified (R26.2)     Time: 1012-1028 PT Time Calculation (min) (ACUTE ONLY): 16 min  Charges:    $Gait Training: 8-22 mins PT General Charges $$ ACUTE PT VISIT: 1 Visit                     Salik Grewell R. PTA Acute Rehabilitation Services Office: [phone]   Catalina Antigua 06/16/2023, 12:05 PM

## 2023-06-16 NOTE — Consult Note (Addendum)
 Physical Medicine and Rehabilitation Consult Reason for Consult:debility after MVR/TVR, MAZE Referring Physician: Leafy Ro   HPI: [name] [name] is a 78 y.o. male  with hx of Prostate CA, MR/TR/ afib who was admitted on 2/18 for severe MR and moderate TR who underwent complex MV and TV repair with full left and right-sided MAZE with radiofrequency and cry lesions.  Underwent DDCV,PPM placement on 2/25. Course complicated by GIB, A-fib with RVR, hemorrhagic shock with hypotension. Pt underwent colonoscopy on 3/8 which was suspicious for ischemic colitis as source of bleeding. One lesion was biopsied and will eventually need to be resected. Bleeding has stopped and GI signed off. Pt was up with therapies yesterday and was mod assist for sit-std transfers and walked 125' using RW at [facility] assist level.  Pt was min to max assist with basic ADL's. Pt was independent and driving prior to admit. He lives with spouse in one level home with steps to enter.      Review of Systems  Constitutional: Negative.   HENT: Negative.    Eyes: Negative.   Respiratory:  Positive for cough.   Cardiovascular:  Positive for chest pain and leg swelling.  Gastrointestinal:  Negative for heartburn.  Genitourinary: Negative.   Musculoskeletal: Negative.   Skin: Negative.   Neurological:  Positive for weakness.  Psychiatric/Behavioral: Negative.     Past Medical History:  Diagnosis Date   Arthritis    Back   CVA (cerebral infarction) 07/14/2009   TIA   Dysrhythmia    A. Fib   Heart murmur    History of kidney stones    Hyperlipidemia    Hypertension    Nephrolithiasis    Paroxysmal atrial fibrillation (HCC)    Paroxysmal atrial flutter (HCC)    a. s/p ablation 2012.   Prostate cancer [facility])    Stroke [facility])    TIA - no deficits   Past Surgical History:  Procedure Laterality Date   APPENDECTOMY  1967   BONE BIOPSY  06/14/2023   Procedure: BIOPSY,Colon;  Surgeon: Benancio Deeds, MD;  Location:  [facility] ENDOSCOPY;  Service: Gastroenterology;;   CARDIOVERSION N/A 06/03/2023   Procedure: CARDIOVERSION;  Surgeon: Jodelle Red, MD;  Location: [facility] INVASIVE CV LAB;  Service: Cardiovascular;  Laterality: N/A;   CATARACT EXTRACTION W/ INTRAOCULAR LENS IMPLANT Bilateral    CERVICAL SPINE SURGERY     Cadaver Bone Placed   COLONOSCOPY     COLONOSCOPY N/A 06/14/2023   Procedure: COLONOSCOPY;  Surgeon: Benancio Deeds, MD;  Location: [facility] ENDOSCOPY;  Service: Gastroenterology;  Laterality: N/A;   CYSTOSCOPY/URETEROSCOPY/HOLMIUM LASER/STENT PLACEMENT Left 09/19/2020   Procedure: CYSTOSCOPY RETROGRADE PYELOGRAM URETEROSCOPY/HOLMIUM LASER/STENT PLACEMENT;  Surgeon: Bjorn Pippin, MD;  Location: [facility] ORS;  Service: Urology;  Laterality: Left;   EYE SURGERY Bilateral    Eye Lid lifting   LUMBAR SPINE SURGERY     Ruptured Disc   MAZE N/A 05/27/2023   Procedure: MAZE;  Surgeon: Eugenio Hoes, MD;  Location: [facility] OR;  Service: Open Heart Surgery;  Laterality: N/A;   MITRAL VALVE REPAIR N/A 05/27/2023   Procedure: MITRAL VALVE REPAIR USING MEDTRONIC SIMULUS SEMI-RIDGID ANNULOPLASTY BAND (MVR);  Surgeon: Eugenio Hoes, MD;  Location: [facility] OR;  Service: Open Heart Surgery;  Laterality: N/A;   PACEMAKER IMPLANT N/A 06/03/2023   Procedure: PACEMAKER IMPLANT;  Surgeon: Marinus Maw, MD;  Location: [facility] INVASIVE CV LAB;  Service: Cardiovascular;  Laterality: N/A;   PROSTATECTOMY  2005   RIGHT/LEFT HEART CATH  AND CORONARY ANGIOGRAPHY N/A 02/27/2023   Procedure: RIGHT/LEFT HEART CATH AND CORONARY ANGIOGRAPHY;  Surgeon: [name], MD;  Location: [facility] INVASIVE CV LAB;  Service: Cardiovascular;  Laterality: N/A;   TEE WITHOUT CARDIOVERSION N/A 02/07/2023   Procedure: TRANSESOPHAGEAL ECHOCARDIOGRAM;  Surgeon: Thurmon Fair, MD;  Location: [facility] INVASIVE CV LAB;  Service: Cardiovascular;  Laterality: N/A;   TEE WITHOUT CARDIOVERSION N/A 05/27/2023   Procedure: TRANSESOPHAGEAL ECHOCARDIOGRAM (TEE);  Surgeon:  Eugenio Hoes, MD;  Location: [facility] OR;  Service: Open Heart Surgery;  Laterality: N/A;   TRANSESOPHAGEAL ECHOCARDIOGRAM (CATH LAB) N/A 06/03/2023   Procedure: TRANSESOPHAGEAL ECHOCARDIOGRAM;  Surgeon: Jodelle Red, MD;  Location: [facility] INVASIVE CV LAB;  Service: Cardiovascular;  Laterality: N/A;   TRICUSPID VALVE REPLACEMENT N/A 05/27/2023   Procedure: TRICUSPID VALVE REPAIR USING EDWARDS MC3 TRICUSPID ANNULOPLASTY RING;  Surgeon: Eugenio Hoes, MD;  Location: [facility] OR;  Service: Open Heart Surgery;  Laterality: N/A;   Family History  Problem Relation Age of Onset   Heart disease Mother 55       died   Prostate cancer Father        alive   Testicular cancer Brother        died   Social History:  reports that he has never smoked. He has never used smokeless tobacco. He reports that he does not drink alcohol and does not use drugs. Allergies: No Known Allergies Medications Prior to Admission  Medication Sig Dispense Refill   amoxicillin-clavulanate (AUGMENTIN) 500-125 MG tablet Take 1 tablet by mouth at bedtime.     atorvastatin (LIPITOR) 20 MG tablet Take 1 tablet by mouth once daily 90 tablet 1   furosemide (LASIX) 20 MG tablet Take 1 tablet (20 mg total) by mouth daily. (Patient taking differently: Take 20 mg by mouth daily as needed for fluid or edema.) 30 tablet 11   losartan (COZAAR) 25 MG tablet Take 1 tablet (25 mg total) by mouth daily. (Patient taking differently: Take 25 mg by mouth every evening.) 90 tablet 3   metoprolol succinate (TOPROL-XL) 50 MG 24 hr tablet Take 1 tablet (50 mg total) by mouth at bedtime. Take with or immediately following a meal. (Patient taking differently: Take 75 mg by mouth at bedtime. Take with or immediately following a meal.) 90 tablet 3   rivaroxaban (XARELTO) 20 MG TABS tablet TAKE 1 TABLET BY MOUTH ONCE DAILY WITH SUPPER 90 tablet 1   traMADol (ULTRAM) 50 MG tablet TAKE 1 TABLET BY MOUTH EVERY 6 HOURS AS NEEDED. 120 tablet 0    Home: Home  Living Family/patient expects to be discharged to:: Private residence Living Arrangements: Spouse/significant other Available Help at Discharge: Family, Available 24 hours/day Type of Home: House Home Access: Level entry Entrance Stairs-Number of Steps: 2 Home Layout: Two level, Able to live on main level with bedroom/bathroom Bathroom Shower/Tub: Psychologist, counselling, Engineer, manufacturing systems: Handicapped height Bathroom Accessibility: Yes Home Equipment: Other (comment) Additional Comments: has upright walker  Lives With: Spouse  Functional History: Prior Function Prior Level of Function : Independent/Modified Independent, Driving Mobility Comments: was walking independently and assisting wife after TKA ADLs Comments: struggled with LB bathing and dressing Functional Status:  Mobility: Bed Mobility Overal bed mobility: Needs Assistance Bed Mobility: Rolling, Sit to Sidelying Rolling: Min assist Sidelying to sit: Mod assist Supine to sit: HOB elevated, Mod assist Sit to supine: Mod assist Sit to sidelying: Min assist General bed mobility comments: cues for precautions and min A to manage LEs into bed  Transfers Overall transfer level: Needs assistance Equipment used: None Transfers: Sit to/from Stand, Bed to chair/wheelchair/BSC Sit to Stand: Mod assist Bed to/from chair/wheelchair/BSC transfer type:: Step pivot Step pivot transfers: Mod assist General transfer comment: mod A to boost to stand with pt utilizing heart pillow at chest, good eccentric control when coming to sit Ambulation/Gait Ambulation/Gait assistance: Contact guard assist Gait Distance (Feet): 125 Feet Assistive device: Rolling walker (2 wheels) Gait Pattern/deviations: Step-through pattern, Decreased stride length General Gait Details: cues for larger strides as pt with tendecy for short shuffling steps initially, able to correct Gait velocity: decr Gait velocity interpretation: <1.8 ft/sec, indicate of  risk for recurrent falls    ADL: ADL Overall ADL's : Needs assistance/impaired Eating/Feeding: Modified independent, Sitting Grooming: Wash/dry hands, Wash/dry face, Oral care, Set up, Sitting Upper Body Bathing: Minimal assistance, Sitting Lower Body Bathing: Maximal assistance, Sit to/from stand Upper Body Dressing : Minimal assistance, Sitting Upper Body Dressing Details (indicate cue type and reason): min assist due to lines Lower Body Dressing: Maximal assistance, Sitting/lateral leans Lower Body Dressing Details (indicate cue type and reason): to change socks Toilet Transfer: Moderate assistance, BSC/3in1 (step-pivot; HHA +1) Toilet Transfer Details (indicate cue type and reason): simulated chair to bed Toileting- Clothing Manipulation and Hygiene: Total assistance, +2 for safety/equipment, Sit to/from stand General ADL Comments: Pt with continued decreased activity tolerance but with noted improvements over last skilled OT session.  Cognition: Cognition Orientation Level: Oriented X4 Cognition Arousal: Alert Behavior During Therapy: WFL for tasks assessed/performed  Blood pressure (!) 88/66, pulse 80, temperature 97.9 F (36.6 C), temperature source Oral, resp. rate (!) 27, height 5\' 10"  (1.778 m), weight 87.3 kg, SpO2 98%. Physical Exam Constitutional:      Appearance: He is not ill-appearing.  HENT:     Head: Normocephalic and atraumatic.     Mouth/Throat:     Mouth: Mucous membranes are moist.  Eyes:     Pupils: Pupils are equal, round, and reactive to light.  Cardiovascular:     Rate and Rhythm: Normal rate.  Pulmonary:     Effort: Pulmonary effort is normal.  Abdominal:     Palpations: Abdomen is soft.  Musculoskeletal:     Cervical back: Normal range of motion.     Right lower leg: Edema present.     Left lower leg: Edema present.  Skin:    General: Skin is warm and dry.     Comments: Incision CDI  Neurological:     Mental Status: He is alert.      Comments: Alert and oriented x 3. Normal insight and awareness. Intact Memory. Normal language and speech. Cranial nerve exam unremarkable. MMT: 4/5 UE prox to disal. LE 3+/5 HF, KE and 4/5 ADF/PF. Sensory exam normal for light touch and pain in all 4 limbs. No limb ataxia or cerebellar signs. No abnormal tone appreciated.  Marland Kitchen    Psychiatric:        Mood and Affect: Mood normal.        Behavior: Behavior normal.     Results for orders placed or performed during the hospital encounter of 05/27/23 (from the past 24 hours)  Glucose, capillary     Status: None   Collection Time: 06/15/23  4:26 PM  Result Value Ref Range   Glucose-Capillary 93 70 - 99 mg/dL  CBC     Status: Abnormal   Collection Time: 06/16/23  3:22 AM  Result Value Ref Range   WBC 8.9 4.0 - 10.5 K/uL  RBC 3.21 (L) 4.22 - 5.81 MIL/uL   Hemoglobin 9.3 (L) 13.0 - 17.0 g/dL   HCT 65.7 (L) 84.6 - 96.2 %   MCV 88.8 80.0 - 100.0 fL   MCH 29.0 26.0 - 34.0 pg   MCHC 32.6 30.0 - 36.0 g/dL   RDW 95.2 (H) 84.1 - 32.4 %   Platelets 166 150 - 400 K/uL   nRBC 0.0 0.0 - 0.2 %  Basic metabolic panel     Status: Abnormal   Collection Time: 06/16/23  3:22 AM  Result Value Ref Range   Sodium 139 135 - 145 mmol/L   Potassium 3.3 (L) 3.5 - 5.1 mmol/L   Chloride 97 (L) 98 - 111 mmol/L   CO2 19 (L) 22 - 32 mmol/L   Glucose, Bld 127 (H) 70 - 99 mg/dL   BUN 12 8 - 23 mg/dL   Creatinine, Ser 4.01 (H) 0.61 - 1.24 mg/dL   Calcium 6.5 (L) 8.9 - 10.3 mg/dL   GFR, Estimated 58 (L) >60 mL/min   Anion gap 23 (H) 5 - 15  Magnesium     Status: None   Collection Time: 06/16/23  3:22 AM  Result Value Ref Range   Magnesium 1.7 1.7 - 2.4 mg/dL  Phosphorus     Status: Abnormal   Collection Time: 06/16/23  3:22 AM  Result Value Ref Range   Phosphorus 2.4 (L) 2.5 - 4.6 mg/dL  Glucose, capillary     Status: None   Collection Time: 06/16/23  6:35 AM  Result Value Ref Range   Glucose-Capillary 73 70 - 99 mg/dL   Comment 1 Notify RN    Comment 2  Document in Chart    DG Chest 2 View Result Date: 06/16/2023 CLINICAL DATA:  [id number] S/P MVR (mitral valve repair) [id number]. Shortness of breath. Chest pain. EXAM: CHEST - 2 VIEW COMPARISON:  06/12/2023. FINDINGS: Bilateral lung fields are clear. No acute consolidation or lung collapse. There are probable left retrocardiac atelectatic changes. Bilateral costophrenic angles are clear. Stable mildly enlarged cardio-mediastinal silhouette. Left-sided ICD device noted. Left atrial appendage closure device and prosthetic tricuspid/mitral valves noted. No acute osseous abnormalities. The soft tissues are within normal limits. Lower cervical spinal fixation hardware noted. IMPRESSION: *No active cardiopulmonary disease. Electronically Signed   By: Jules Schick M.D.   On: 06/16/2023 08:20    Assessment/Plan: Diagnosis: 78 yo male with debility after MVR/TVR/MAZE with additional post-op complications.  Does the need for close, 24 hr/day medical supervision in concert with the patient's rehab needs make it unreasonable for this patient to be served in a less intensive setting? Yes Co-Morbidities requiring supervision/potential complications:  -?ischemic colitis with associated GI bleed -paroxysmal a fib -ess hypertension -prostate cancer Due to bladder management, bowel management, safety, skin/wound care, disease management, medication administration, pain management, and patient education, does the patient require 24 hr/day rehab nursing? Yes Does the patient require coordinated care of a physician, rehab nurse, therapy disciplines of PT, OT to address physical and functional deficits in the context of the above medical diagnosis(es)? Yes Addressing deficits in the following areas: balance, endurance, locomotion, strength, transferring, bowel/bladder control, bathing, dressing, feeding, grooming, toileting, and psychosocial support Can the patient actively participate in an intensive therapy program of at  least 3 hrs of therapy per day at least 5 days per week? Yes The potential for patient to make measurable gains while on inpatient rehab is excellent Anticipated functional outcomes upon discharge from inpatient rehab are modified independent  with PT, modified independent and supervision with OT, n/a with SLP. Estimated rehab length of stay to reach the above functional goals is: 7 days Anticipated discharge destination: Home Overall Rehab/Functional Prognosis: excellent  POST ACUTE RECOMMENDATIONS: This patient's condition is appropriate for continued rehabilitative care in the following setting: CIR Patient has agreed to participate in recommended program. Yes Note that insurance prior authorization may be required for reimbursement for recommended care.  Comment: Pt lives with his wife who struggles with mobility issues herself. Mr. Tylerjames needs to be essentially modified independent to return home which we can accomplish on inpatient rehab while working him to observe sternal and pacer precautions. Furthermore, rehab provides a venue where we can monitor his GI issues/bleeding as anticoagulation is resumed. Rehab Admissions Coordinator to follow up.     I have personally performed a face to face diagnostic evaluation of this patient. Additionally, I have examined the patient's medical record including any pertinent labs and radiographic images.    Thanks,  Ranelle Oyster, MD 06/16/2023

## 2023-06-16 NOTE — Progress Notes (Addendum)
      [address]       [place] [address]             [phone]        2 Days Post-Op Procedure(s) (LRB): COLONOSCOPY (N/A) BIOPSY,Colon TATTOOING, USING INTRADERMAL PIGMENT  Subjective: Per patient, no bloody stools yesterday or this am. He has no specific complaint this am.  Objective: Vital signs in last 24 hours: Temp:  [97.8 F (36.6 C)-98.5 F (36.9 C)] 98.5 F (36.9 C) (03/10 0322) Pulse Rate:  [77-122] 89 (03/10 0322) Cardiac Rhythm: Atrial fibrillation (03/09 1942) Resp:  [13-27] 15 (03/10 0322) BP: (87-109)/(58-75) 99/69 (03/10 0322) SpO2:  [85 %-100 %] 97 % (03/10 0322) Weight:  [87.3 kg] 87.3 kg (03/09 1942)  Pre op weight  93 kg Current Weight  06/15/23 87.3 kg      Intake/Output from previous day: 03/09 0701 - 03/10 0700 In: 646.1 [P.O.:240; I.V.:308; IV Piggyback:98.2] Out: 650 [Urine:650]   Physical Exam:  Cardiovascular: IRRR IRRR Pulmonary: Clear to auscultation bilaterally Abdomen: Soft, non tender, bowel sounds present. Extremities: Mild bilateral lower extremity edema. Wounds: Clean and dry.  No erythema or signs of infection.  Lab Results: CBC: Recent Labs    06/15/23 0329 06/16/23 0322  WBC 10.7* 8.9  HGB 10.3* 9.3*  HCT 31.4* 28.5*  PLT 198 166   BMET:  Recent Labs    06/15/23 0329 06/16/23 0322  NA 135 139  K 3.4* 3.3*  CL 102 97*  CO2 22 19*  GLUCOSE 240* 127*  BUN 15 12  CREATININE 1.13 1.25*  CALCIUM 7.5* 6.5*    PT/INR:  Lab Results  Component Value Date   INR 3.4 (H) 06/09/2023   INR 1.7 (H) 05/27/2023   INR 1.2 05/23/2023   ABG:  INR: Will add last result for INR, ABG once components are confirmed Will add last 4 CBG results once components are confirmed  Assessment/Plan: CV-History of persistent a fib. A fib this am with HR in the 90's. On Amiodarone 400 mg bid.  As discussed with Dr. Leafy Ro, will start oral Digoxin 0.25 daily and low dose ec asa. Hope to resume Apixaban  soon, but not today as H/H decreased. He ultimately may need DCCV but would have to be anticoagulated for a while prior to this. 2. Pulmonary-On room air.  PA/LAT CXR this am appears stable. Encourage incentive spirometer  3. GI-s/p colonoscopy 03/08. Results showed diverticula on right and left colon, patchy inflammation, multiple sessile polyps and linear ulcerations (consistent with ischemic colitis), one large lesion distal transverse colon biopsied (removal as an outpatient). Biopsy result pending. 4. H and H this am slightly decreased to 9.3 and 28.5. Recheck in am 5. AKI nearly resolved-creatinine this am 1.25. 6. CBGs 99/93/73. Pre op HGA1C 5.8. He likely has pre diabetes. Will stop accu checks and SS PRN 7. LE edema-on Lasix 40 mg daily. 8. Leukocytosis resolved-WBC 8900 this am. He has been on Zosyn since 03/04. Last dose scheduled for am 9. Supplement potassium 10. Deconditioned-PT/OT. Will likely need SNF when ready  Averi Cacioppo M ZimmermanPA-C 6:58 AM

## 2023-06-16 NOTE — Progress Notes (Signed)
 Occupational Therapy Treatment Patient Details Name: [name] [name] MRN: [id number] DOB: 08-18-1945 Today's Date: 06/16/2023   History of present illness 78 y/o male admitted 2/18 for mitral and tricuspid valve repair, MAZE same date. 2/25 DCCV, PPM placed.  3/3 pt transferred out of ICU with readmit on 3/3 due to GI bleed, a-fib RVR, hemorrhagic shock, and hypotension. PMH: Mitral regurgitation, paroxysmal Afib, CVA, HTN, HLD, Prostate CA, glaucoma   OT comments  Patient received in supine and agreeable to OT treatment. Patient demonstrating gains with bed mobility and transfers with mod assist to get to EOB and to transfer to recliner with 1 person HHA. Self care performed seated in recliner with assistance for UB due to lines and max assist for LB bathing and dressing. Patient will benefit from intensive inpatient follow-up therapy, >3 hours/day. Acute OT to continue to follow to address established goals to facilitate DC to next venue of care.       If plan is discharge home, recommend the following:  A lot of help with walking and/or transfers;A lot of help with bathing/dressing/bathroom;Assistance with cooking/housework;Assist for transportation;Help with stairs or ramp for entrance   Equipment Recommendations  BSC/3in1    Recommendations for Other Services Rehab consult    Precautions / Restrictions Precautions Precautions: Sternal;Fall;ICD/Pacemaker Precaution Booklet Issued: Yes (comment) Recall of Precautions/Restrictions: Intact Precaution/Restrictions Comments: able to recall precautions Restrictions Weight Bearing Restrictions Per Provider Order: Yes RUE Weight Bearing Per Provider Order: Non weight bearing LUE Weight Bearing Per Provider Order: Non weight bearing Other Position/Activity Restrictions: sternal precautions/ICD/pacemaker precautions       Mobility Bed Mobility Overal bed mobility: Needs Assistance Bed Mobility: Rolling, Sidelying to Sit Rolling: Min  assist Sidelying to sit: Mod assist       General bed mobility comments: cues for precautions and mod assist with trunk    Transfers Overall transfer level: Needs assistance Equipment used: 1 person hand held assist Transfers: Sit to/from Stand, Bed to chair/wheelchair/BSC Sit to Stand: Mod assist     Step pivot transfers: Mod assist     General transfer comment: transfer from EOB to [facility]     Balance Overall balance assessment: Needs assistance Sitting-balance support: No upper extremity supported, Feet supported Sitting balance-Leahy Scale: Fair     Standing balance support: Single extremity supported, During functional activity Standing balance-Leahy Scale: Poor Standing balance comment: stood for transfer                           ADL either performed or assessed with clinical judgement   ADL Overall ADL's : Needs assistance/impaired     Grooming: Wash/dry hands;Wash/dry face;Oral care;Set up;Sitting   Upper Body Bathing: Minimal assistance;Sitting   Lower Body Bathing: Maximal assistance;Sit to/from stand   Upper Body Dressing : Minimal assistance;Sitting Upper Body Dressing Details (indicate cue type and reason): min assist due to lines Lower Body Dressing: Maximal assistance;Sitting/lateral leans Lower Body Dressing Details (indicate cue type and reason): to change socks                    Extremity/Trunk Assessment              Vision       Perception     Praxis     Communication Communication Communication: No apparent difficulties Factors Affecting Communication: Other (comment) (soft voice)   Cognition Arousal: Alert Behavior During Therapy: WFL for tasks assessed/performed Cognition: No apparent impairments  OT - Cognition Comments: alert and oriented and able to recall precautions                 Following commands: Intact        Cueing   Cueing Techniques: Verbal cues, Visual cues   Exercises      Shoulder Instructions       General Comments BP up in recliner 102/71(81)    Pertinent Vitals/ Pain       Pain Assessment Pain Assessment: Faces Faces Pain Scale: Hurts little more Pain Location: buttocks seated in recliner Pain Descriptors / Indicators: Aching, Discomfort, Sore Pain Intervention(s): Monitored during session, Repositioned  Home Living                                          Prior Functioning/Environment              Frequency  Min 2X/week        Progress Toward Goals  OT Goals(current goals can now be found in the care plan section)  Progress towards OT goals: Progressing toward goals  Acute Rehab OT Goals OT Goal Formulation: With patient Time For Goal Achievement: 06/20/23 Potential to Achieve Goals: Good ADL Goals Pt Will Perform Grooming: with contact guard assist;standing Pt Will Perform Upper Body Bathing: with min assist;sitting Pt Will Perform Upper Body Dressing: with min assist;sitting Pt Will Transfer to Toilet: with contact guard assist;ambulating;bedside commode Additional ADL Goal #1: Pt will complete bed mobility with CGA in preparation for ADLs. Additional ADL Goal #2: Pt will adhere to sternal and pacemaker precautions during ADLs and mobility.  Plan      Co-evaluation                 AM-PAC OT "6 Clicks" Daily Activity     Outcome Measure   Help from another person eating meals?: A Little Help from another person taking care of personal grooming?: A Little Help from another person toileting, which includes using toliet, bedpan, or urinal?: A Lot Help from another person bathing (including washing, rinsing, drying)?: A Lot Help from another person to put on and taking off regular upper body clothing?: A Little Help from another person to put on and taking off regular lower body clothing?: A Lot 6 Click Score: 15    End of Session    OT Visit Diagnosis: Other abnormalities  of gait and mobility (R26.89);Unsteadiness on feet (R26.81);Muscle weakness (generalized) (M62.81);Other (comment) (decreased activity tolerance)   Activity Tolerance Patient tolerated treatment well   Patient Left in chair;with call bell/phone within reach;with chair alarm set   Nurse Communication Mobility status;Precautions        Time: [id number] OT Time Calculation (min): 37 min  Charges: OT General Charges $OT Visit: 1 Visit OT Treatments $Self Care/Home Management : 23-37 mins  Alfonse Flavors, OTA Acute Rehabilitation Services  Office [phone]   Dewain Penning 06/16/2023, 10:46 AM

## 2023-06-16 NOTE — Progress Notes (Signed)
 Inpatient Rehab Admissions Coordinator:  Saw pt at bedside. Insurance authorization started at 1437.    Wolfgang Phoenix, MS, CCC-SLP Admissions Coordinator [phone]

## 2023-06-16 NOTE — Progress Notes (Signed)
 Pharmacy Antibiotic Note  [name] [name] is a 78 y.o. male admitted on 05/27/2023 for MR/TR repairs and Maze for Afib. S/p started apixaban post op > GI bleed and now concern for   intra-abdominal infection .  WBC 26 hypothermic,AKI Cr 1> 2.5 with hypotension in setting of bleed.  Pharmacy has been consulted for Zosyn dosing.  Plan: Zosyn 3.375gm IV q8h EI- stop date entered for tomorrow 3/11.   Height: 5\' 10"  (177.8 cm) Weight: 87.3 kg (192 lb 7.4 oz) IBW/kg (Calculated) : 73  Temp (24hrs), Avg:97.9 F (36.6 C), Min:97.6 F (36.4 C), Max:98.5 F (36.9 C)  Recent Labs  Lab 06/11/23 1241 06/11/23 1657 06/12/23 0503 06/13/23 0515 06/13/23 1711 06/14/23 0225 06/15/23 0329 06/16/23 0322  WBC  --    < > 15.9* 10.2  --  10.5 10.7* 8.9  CREATININE  --   --  1.52* 1.23 1.38* 1.26* 1.13 1.25*  LATICACIDVEN 1.1  --   --   --   --   --   --   --    < > = values in this interval not displayed.    Estimated Creatinine Clearance: 48.7 mL/min (A) (by C-G formula based on SCr of 1.25 mg/dL (H)).    No Known Allergies  Reece Leader, Colon Flattery, BCCP Clinical Pharmacist  06/16/2023 11:00 AM   [facility] pharmacy phone numbers are listed on [email or web address]

## 2023-06-17 LAB — CBC
HCT: 32.2 % — ABNORMAL LOW (ref 39.0–52.0)
Hemoglobin: 10.5 g/dL — ABNORMAL LOW (ref 13.0–17.0)
MCH: 29.1 pg (ref 26.0–34.0)
MCHC: 32.6 g/dL (ref 30.0–36.0)
MCV: 89.2 fL (ref 80.0–100.0)
Platelets: 200 10*3/uL (ref 150–400)
RBC: 3.61 MIL/uL — ABNORMAL LOW (ref 4.22–5.81)
RDW: 15.2 % (ref 11.5–15.5)
WBC: 9.6 10*3/uL (ref 4.0–10.5)
nRBC: 0 % (ref 0.0–0.2)

## 2023-06-17 LAB — BASIC METABOLIC PANEL
Anion gap: 7 (ref 5–15)
BUN: 15 mg/dL (ref 8–23)
CO2: 23 mmol/L (ref 22–32)
Calcium: 7.3 mg/dL — ABNORMAL LOW (ref 8.9–10.3)
Chloride: 108 mmol/L (ref 98–111)
Creatinine, Ser: 1.1 mg/dL (ref 0.61–1.24)
GFR, Estimated: >60 mL/min (ref >60)
Glucose, Bld: 134 mg/dL — ABNORMAL HIGH (ref 70–99)
Potassium: 4 mmol/L (ref 3.5–5.1)
Sodium: 138 mmol/L (ref 135–145)

## 2023-06-17 LAB — SURGICAL PATHOLOGY

## 2023-06-17 LAB — MAGNESIUM: Magnesium: 2.2 mg/dL (ref 1.7–2.4)

## 2023-06-17 MED ORDER — APIXABAN 2.5 MG PO TABS
2.5000 mg | ORAL_TABLET | Freq: Two times a day (BID) | ORAL | Status: DC
  Administered 2023-06-17 – 2023-06-18 (×3): 2.5 mg via ORAL
  Filled 2023-06-17 (×3): qty 1

## 2023-06-17 MED ORDER — PANTOPRAZOLE SODIUM 40 MG PO TBEC
40.0000 mg | DELAYED_RELEASE_TABLET | Freq: Two times a day (BID) | ORAL | Status: DC
  Administered 2023-06-17 – 2023-06-18 (×2): 40 mg via ORAL
  Filled 2023-06-17 (×2): qty 1

## 2023-06-17 MED ORDER — POTASSIUM CHLORIDE CRYS ER 20 MEQ PO TBCR
20.0000 meq | EXTENDED_RELEASE_TABLET | Freq: Once | ORAL | Status: AC
  Administered 2023-06-17: 20 meq via ORAL
  Filled 2023-06-17: qty 1

## 2023-06-17 NOTE — Progress Notes (Signed)
 CARDIAC REHAB PHASE I    Pt resting in bed, feeling well today. Slow progress with mobility via PT/OT and mobility team. Encouraged continued mobility and IS use. Will return to provide postop OHS education.   [id number] Woodroe Chen, RN BSN 06/17/2023 1:12 PM

## 2023-06-17 NOTE — Plan of Care (Signed)

## 2023-06-17 NOTE — Care Management Important Message (Signed)
 Important Message  Patient Details  Name: [name] [name] MRN: [id number] Date of Birth: 02-14-1946   Important Message Given:  Yes - Medicare IM     [name] 06/17/2023, 10:30 AM

## 2023-06-17 NOTE — Progress Notes (Signed)
 Inpatient Rehabilitation Admissions Coordinator   I have received insurance approval for CIR admit. I updated Dr Leafy Ro who states he should be ready to discharge to CIR tomorrow. I contacted patient and he is aware and in agreement.  Ottie Glazier, RN, MSN Rehab Admissions Coordinator [phone] 06/17/2023 5:42 PM

## 2023-06-17 NOTE — Telephone Encounter (Signed)
Pt still in the hospital.

## 2023-06-17 NOTE — Progress Notes (Signed)
   Inpatient Rehabilitation Admissions Coordinator   I met with patient at bedside. I reviewed estimated cost of care for possible Cir admit. I await insurance approval and medical readiness for possible Cir admit.  Ottie Glazier, RN, MSN Rehab Admissions Coordinator [phone] 06/17/2023 11:52 AM

## 2023-06-17 NOTE — Progress Notes (Signed)
      [address]       [place] [address]             [phone]        3 Days Post-Op Procedure(s) (LRB): COLONOSCOPY (N/A) BIOPSY,Colon TATTOOING, USING INTRADERMAL PIGMENT  Subjective: Patient just waking up this am. He is about to order breakfast. He has no specific complaint this am.  Objective: Vital signs in last 24 hours: Temp:  [97.6 F (36.4 C)-98.5 F (36.9 C)] 98.4 F (36.9 C) (03/11 0355) Pulse Rate:  [80-99] 85 (03/11 0456) Cardiac Rhythm: Atrial fibrillation (03/10 2034) Resp:  [17-27] 20 (03/11 0456) BP: (88-109)/(60-66) 109/65 (03/11 0355) SpO2:  [95 %-100 %] 96 % (03/11 0456) Weight:  [86.9 kg] 86.9 kg (03/11 0456)  Pre op weight  93 kg Current Weight  06/17/23 86.9 kg      Intake/Output from previous day: No intake/output data recorded.   Physical Exam:  Cardiovascular: IRRR IRRR Pulmonary: Clear to auscultation bilaterally Abdomen: Soft, non tender, bowel sounds present. Extremities: No lower extremity edema. Wounds: Clean and dry.  No erythema or signs of infection. Steri strips intact left PPM wound  Lab Results: CBC: Recent Labs    06/16/23 0322 06/17/23 0359  WBC 8.9 9.6  HGB 9.3* 10.5*  HCT 28.5* 32.2*  PLT 166 200   BMET:  Recent Labs    06/16/23 0322 06/17/23 0359  NA 139 138  K 3.3* 4.0  CL 97* 108  CO2 19* 23  GLUCOSE 127* 134*  BUN 12 15  CREATININE 1.25* 1.10  CALCIUM 6.5* 7.3*    PT/INR:  Lab Results  Component Value Date   INR 3.4 (H) 06/09/2023   INR 1.7 (H) 05/27/2023   INR 1.2 05/23/2023   ABG:  INR: Will add last result for INR, ABG once components are confirmed Will add last 4 CBG results once components are confirmed  Assessment/Plan: CV-History of persistent a fib. A fib this am with HR in the 80's. On Amiodarone 400 mg bid and Digoxin 0.125 mg daily.  On ec asa 81 mg daily. As discussed with Dr. Leafy Ro, restart low dose Apixaban 2. Pulmonary-On room air. Encourage  incentive spirometer  3. GI-s/p colonoscopy 03/08. Results showed diverticula on right and left colon, patchy inflammation, multiple sessile polyps and linear ulcerations (consistent with ischemic colitis), one large lesion distal transverse colon biopsied (removal as an outpatient). Biopsy result pending. 4. H and H this am increased to 10.5 and 32.2. Recheck in am 5. AKI resolved-creatinine this am 1.10. 7. LE edema-on Lasix 40 mg daily. 8. Leukocytosis resolved-WBC 8900 this am. He has been on Zosyn since 03/04. Last dose today 9. Deconditioned-PT/OT. Will need CIR when ready  Keia Rask M ZimmermanPA-C 6:53 AM

## 2023-06-18 ENCOUNTER — Encounter (HOSPITAL_COMMUNITY): Payer: Self-pay | Admitting: Physical Medicine and Rehabilitation

## 2023-06-18 ENCOUNTER — Encounter (HOSPITAL_COMMUNITY): Payer: Self-pay | Admitting: Thoracic Surgery (Cardiothoracic Vascular Surgery)

## 2023-06-18 ENCOUNTER — Other Ambulatory Visit: Payer: Self-pay

## 2023-06-18 ENCOUNTER — Inpatient Hospital Stay (HOSPITAL_COMMUNITY)
Admission: AD | Admit: 2023-06-18 | Discharge: 2023-06-24 | DRG: 945 | Disposition: A | Discharge status: Home Health | Source: Intra-hospital | Attending: Physical Medicine and Rehabilitation | Admitting: Physical Medicine and Rehabilitation

## 2023-06-18 DIAGNOSIS — Z8249 Family history of ischemic heart disease and other diseases of the circulatory system: Secondary | ICD-10-CM

## 2023-06-18 DIAGNOSIS — Z7982 Long term (current) use of aspirin: Secondary | ICD-10-CM

## 2023-06-18 DIAGNOSIS — Z9841 Cataract extraction status, right eye: Secondary | ICD-10-CM | POA: Diagnosis not present

## 2023-06-18 DIAGNOSIS — Z961 Presence of intraocular lens: Secondary | ICD-10-CM | POA: Diagnosis present

## 2023-06-18 DIAGNOSIS — Z8546 Personal history of malignant neoplasm of prostate: Secondary | ICD-10-CM | POA: Diagnosis not present

## 2023-06-18 DIAGNOSIS — I10 Essential (primary) hypertension: Secondary | ICD-10-CM | POA: Diagnosis present

## 2023-06-18 DIAGNOSIS — Z860101 Personal history of adenomatous and serrated colon polyps: Secondary | ICD-10-CM | POA: Diagnosis not present

## 2023-06-18 DIAGNOSIS — R32 Unspecified urinary incontinence: Secondary | ICD-10-CM | POA: Diagnosis present

## 2023-06-18 DIAGNOSIS — Z8719 Personal history of other diseases of the digestive system: Secondary | ICD-10-CM | POA: Diagnosis not present

## 2023-06-18 DIAGNOSIS — R5381 Other malaise: Principal | ICD-10-CM

## 2023-06-18 DIAGNOSIS — I482 Chronic atrial fibrillation, unspecified: Secondary | ICD-10-CM | POA: Diagnosis not present

## 2023-06-18 DIAGNOSIS — Z9842 Cataract extraction status, left eye: Secondary | ICD-10-CM | POA: Diagnosis not present

## 2023-06-18 DIAGNOSIS — Z7901 Long term (current) use of anticoagulants: Secondary | ICD-10-CM | POA: Diagnosis not present

## 2023-06-18 DIAGNOSIS — E7800 Pure hypercholesterolemia, unspecified: Secondary | ICD-10-CM

## 2023-06-18 DIAGNOSIS — E785 Hyperlipidemia, unspecified: Secondary | ICD-10-CM | POA: Diagnosis not present

## 2023-06-18 DIAGNOSIS — Z9079 Acquired absence of other genital organ(s): Secondary | ICD-10-CM

## 2023-06-18 DIAGNOSIS — Z8673 Personal history of transient ischemic attack (TIA), and cerebral infarction without residual deficits: Secondary | ICD-10-CM

## 2023-06-18 DIAGNOSIS — Z87442 Personal history of urinary calculi: Secondary | ICD-10-CM

## 2023-06-18 DIAGNOSIS — Z79899 Other long term (current) drug therapy: Secondary | ICD-10-CM

## 2023-06-18 DIAGNOSIS — H409 Unspecified glaucoma: Secondary | ICD-10-CM | POA: Diagnosis not present

## 2023-06-18 DIAGNOSIS — D62 Acute posthemorrhagic anemia: Secondary | ICD-10-CM | POA: Diagnosis not present

## 2023-06-18 DIAGNOSIS — Z8043 Family history of malignant neoplasm of testis: Secondary | ICD-10-CM

## 2023-06-18 DIAGNOSIS — N390 Urinary tract infection, site not specified: Secondary | ICD-10-CM | POA: Diagnosis not present

## 2023-06-18 DIAGNOSIS — Z8042 Family history of malignant neoplasm of prostate: Secondary | ICD-10-CM | POA: Diagnosis not present

## 2023-06-18 DIAGNOSIS — Z95828 Presence of other vascular implants and grafts: Secondary | ICD-10-CM

## 2023-06-18 DIAGNOSIS — I480 Paroxysmal atrial fibrillation: Secondary | ICD-10-CM | POA: Diagnosis not present

## 2023-06-18 LAB — CBC
HCT: 33.7 % — ABNORMAL LOW (ref 39.0–52.0)
Hemoglobin: 10.9 g/dL — ABNORMAL LOW (ref 13.0–17.0)
MCH: 29.1 pg (ref 26.0–34.0)
MCHC: 32.3 g/dL (ref 30.0–36.0)
MCV: 89.9 fL (ref 80.0–100.0)
Platelets: 235 10*3/uL (ref 150–400)
RBC: 3.75 MIL/uL — ABNORMAL LOW (ref 4.22–5.81)
RDW: 15.1 % (ref 11.5–15.5)
WBC: 8.7 10*3/uL (ref 4.0–10.5)
nRBC: 0 % (ref 0.0–0.2)

## 2023-06-18 MED ORDER — APIXABAN 2.5 MG PO TABS
2.5000 mg | ORAL_TABLET | Freq: Two times a day (BID) | ORAL | Status: DC
  Administered 2023-06-18 – 2023-06-24 (×12): 2.5 mg via ORAL
  Filled 2023-06-18 (×12): qty 1

## 2023-06-18 MED ORDER — DIGOXIN 125 MCG PO TABS: 0.1250 mg | ORAL_TABLET | Freq: Every day | ORAL | Status: DC

## 2023-06-18 MED ORDER — AMIODARONE HCL 400 MG PO TABS: ORAL_TABLET | ORAL | Status: DC

## 2023-06-18 MED ORDER — ASPIRIN 81 MG PO TBEC: 81.0000 mg | DELAYED_RELEASE_TABLET | Freq: Every day | ORAL | Status: DC

## 2023-06-18 MED ORDER — ATORVASTATIN CALCIUM 10 MG PO TABS
20.0000 mg | ORAL_TABLET | Freq: Every day | ORAL | Status: DC
  Administered 2023-06-19 – 2023-06-24 (×6): 20 mg via ORAL
  Filled 2023-06-18 (×6): qty 2

## 2023-06-18 MED ORDER — ACETAMINOPHEN 325 MG PO TABS
325.0000 mg | ORAL_TABLET | ORAL | Status: DC | PRN
  Filled 2023-06-18: qty 2

## 2023-06-18 MED ORDER — AMIODARONE HCL 200 MG PO TABS
400.0000 mg | ORAL_TABLET | Freq: Two times a day (BID) | ORAL | Status: DC
  Administered 2023-06-18: 400 mg via ORAL
  Filled 2023-06-18: qty 2

## 2023-06-18 MED ORDER — PANTOPRAZOLE SODIUM 40 MG PO TBEC
40.0000 mg | DELAYED_RELEASE_TABLET | Freq: Two times a day (BID) | ORAL | Status: DC
Start: 2023-06-18 — End: 2023-06-24

## 2023-06-18 MED ORDER — FE FUM-VIT C-VIT B12-FA 460-60-0.01-1 MG PO CAPS: 1.0000 | ORAL_CAPSULE | Freq: Every day | ORAL | Status: DC

## 2023-06-18 MED ORDER — GERHARDT'S BUTT CREAM: TOPICAL_CREAM | CUTANEOUS | Status: DC | PRN

## 2023-06-18 MED ORDER — APIXABAN 2.5 MG PO TABS: 2.5000 mg | ORAL_TABLET | Freq: Two times a day (BID) | ORAL | Status: DC

## 2023-06-18 MED ORDER — SALINE SPRAY 0.65 % NA SOLN: 1.0000 | NASAL | Status: DC | PRN

## 2023-06-18 MED ORDER — ASPIRIN 81 MG PO TBEC
81.0000 mg | DELAYED_RELEASE_TABLET | Freq: Every day | ORAL | Status: DC
  Administered 2023-06-19 – 2023-06-24 (×6): 81 mg via ORAL
  Filled 2023-06-18 (×6): qty 1

## 2023-06-18 MED ORDER — PANTOPRAZOLE SODIUM 40 MG PO TBEC
40.0000 mg | DELAYED_RELEASE_TABLET | Freq: Two times a day (BID) | ORAL | Status: DC
  Administered 2023-06-18 – 2023-06-24 (×12): 40 mg via ORAL
  Filled 2023-06-18 (×12): qty 1

## 2023-06-18 MED ORDER — FE FUM-VIT C-VIT B12-FA 460-60-0.01-1 MG PO CAPS
1.0000 | ORAL_CAPSULE | Freq: Every day | ORAL | Status: DC
  Administered 2023-06-19 – 2023-06-24 (×6): 1 via ORAL
  Filled 2023-06-18 (×6): qty 1

## 2023-06-18 MED ORDER — DIGOXIN 125 MCG PO TABS
0.1250 mg | ORAL_TABLET | Freq: Every day | ORAL | Status: DC
  Filled 2023-06-18: qty 1

## 2023-06-18 MED ORDER — FE FUM-VIT C-VIT B12-FA 460-60-0.01-1 MG PO CAPS
1.0000 | ORAL_CAPSULE | Freq: Every day | ORAL | Status: DC
Start: 2023-06-18 — End: 2023-06-18
  Filled 2023-06-18: qty 1

## 2023-06-18 MED ORDER — DIGOXIN 125 MCG PO TABS
0.1250 mg | ORAL_TABLET | Freq: Every day | ORAL | Status: DC
  Administered 2023-06-19 – 2023-06-24 (×6): 0.125 mg via ORAL
  Filled 2023-06-18 (×6): qty 1

## 2023-06-18 NOTE — Progress Notes (Signed)
 Inpatient Rehabilitation Admissions Coordinator   I have insurance approval and CIR bed to admit him to today. I met with patient at bedside and he is in agreement. Acute team and TOC aware. I will make the arrangements at CIR to admit.  Ottie Glazier, RN, MSN Rehab Admissions Coordinator [phone] 06/18/2023 10:56 AM

## 2023-06-18 NOTE — TOC Transition Note (Signed)
 Transition of Care (TOC) - Discharge Note Donn Pierini RN, BSN Transitions of Care Unit 4E- RN Case Manager See Treatment Team for direct phone #   Patient Details  Name: [name] [name] MRN: [id number] Date of Birth: 1945/10/20  Transition of Care [facility]) CM/SW Contact:  Darrold Span, RN Phone Number: 06/18/2023, 11:36 AM   Clinical Narrative:    CM notified this am by CIR liaison that pt has been approved for INPT rehab stay by insurance. Bed available today for admit.   Pt stable for transition to [facility] INPT rehab, discharge order has been placed.   [facility] has been following for any HH needs with referral by TCTS office- liaison- Aggie Cosier notified that pt will go to [facility] INPT rehab- [facility] will continue to follow for any HH needs post rehab stay.    Final next level of care: IP Rehab Facility Barriers to Discharge: Barriers Resolved   Patient Goals and CMS Choice Patient states their goals for this hospitalization and ongoing recovery are:: rehab then return home CMS Medicare.gov Compare Post Acute Care list provided to:: Patient Choice offered to / list presented to : Patient      Discharge Placement               [facility] INPT rehab        Discharge Plan and Services Additional resources added to the After Visit Summary for   In-house Referral: Clinical Social Work Discharge Planning Services: CM Consult Post Acute Care Choice: IP Rehab          DME Arranged: N/A DME Agency: NA       HH Arranged: NA HH Agency: [facility] ([facility])     Representative spoke with at [facility] Agency: Aggie Cosier  Social Drivers of Health (SDOH) Interventions SDOH Screenings   Food Insecurity: No Food Insecurity (05/28/2023)  Housing: Low Risk  (06/18/2023)  Transportation Needs: No Transportation Needs (06/18/2023)  Utilities: Not At Risk (05/28/2023)  Alcohol Screen: Low Risk  (06/18/2023)  Depression (PHQ2-9): Low Risk  (04/07/2023)  Financial Resource Strain:  Low Risk  (06/18/2023)  Physical Activity: Insufficiently Active (10/31/2022)  Social Connections: Socially Integrated (05/28/2023)  Stress: No Stress Concern Present (10/31/2022)  Tobacco Use: Low Risk  (06/14/2023)  Health Literacy: Adequate Health Literacy (10/31/2022)     Readmission Risk Interventions    06/18/2023   11:36 AM  Readmission Risk Prevention Plan  Transportation Screening Complete  Home Care Screening Complete  Medication Review (RN CM) Complete

## 2023-06-18 NOTE — Progress Notes (Signed)
 Physical Therapy Treatment Patient Details Name: [name] [name] MRN: [id number] DOB: 12/13/1945 Today's Date: 06/18/2023   History of Present Illness 78 y/o male admitted 2/18 for mitral and tricuspid valve repair, MAZE same date. 2/25 DCCV, PPM placed.  3/3 pt transferred out of ICU with readmit on 3/3 due to GI bleed, a-fib RVR, hemorrhagic shock, and hypotension. PMH: Mitral regurgitation, paroxysmal Afib, CVA, HTN, HLD, Prostate CA, glaucoma    PT Comments  Pt up in chair on arrival and agreeable to session with good progress towards acute goals. Pt demonstrating increased activity tolerance with increased gait distance with RW for support and grossly CGA for safety. Pt continues to require min A to boost to stand with light cues for hand placement and cues for use of rocking to generate momentum. Patient will benefit from intensive inpatient follow-up therapy, >3 hours/day to maximize functional independence. Will continue to follow acutely.    If plan is discharge home, recommend the following: A little help with walking and/or transfers;A little help with bathing/dressing/bathroom;Assistance with cooking/housework;Assist for transportation;Help with stairs or ramp for entrance   Can travel by private vehicle     No  Equipment Recommendations  Rolling walker (2 wheels);BSC/3in1    Recommendations for Other Services       Precautions / Restrictions Precautions Precautions: Sternal;Fall;ICD/Pacemaker Precaution Booklet Issued: Yes (comment) Recall of Precautions/Restrictions: Intact Precaution/Restrictions Comments: able to recall precautions Restrictions Other Position/Activity Restrictions: sternal precautions/ICD/pacemaker precautions     Mobility  Bed Mobility Overal bed mobility: Needs Assistance             General bed mobility comments: pt up in recliner on arrival    Transfers Overall transfer level: Needs assistance Equipment used: None Transfers: Sit to/from  Stand, Bed to chair/wheelchair/BSC Sit to Stand: Min assist, Contact guard assist           General transfer comment: min A to boost to stand fading to CGA with task practice and focus on  momentum    Ambulation/Gait Ambulation/Gait assistance: Contact guard assist Gait Distance (Feet): 325 Feet Assistive device: Rolling walker (2 wheels) Gait Pattern/deviations: Step-through pattern, Decreased stride length Gait velocity: decr     General Gait Details: cues for upright posture and larger strides as pt with tendecy for short shuffling steps initially, able to correct and maintain   Stairs             Wheelchair Mobility     Tilt Bed    Modified Rankin (Stroke Patients Only)       Balance Overall balance assessment: Needs assistance Sitting-balance support: No upper extremity supported, Feet supported Sitting balance-Leahy Scale: Fair     Standing balance support: Single extremity supported, During functional activity Standing balance-Leahy Scale: Poor Standing balance comment: realint on UE support for dynamic tasks                            Communication Communication Communication: No apparent difficulties Factors Affecting Communication: Other (comment) (soft voice)  Cognition Arousal: Alert Behavior During Therapy: WFL for tasks assessed/performed   PT - Cognitive impairments: No apparent impairments                         Following commands: Intact      Cueing Cueing Techniques: Verbal cues, Visual cues  Exercises      General Comments General comments (skin integrity, edema, etc.): VSS on RA  Pertinent Vitals/Pain Pain Assessment Pain Assessment: Faces Faces Pain Scale: Hurts a little bit Pain Location: buttocks seated in recliner Pain Descriptors / Indicators: Aching Pain Intervention(s): Monitored during session, Limited activity within patient's tolerance    Home Living                           Prior Function            PT Goals (current goals can now be found in the care plan section) Acute Rehab PT Goals Patient Stated Goal: [place] and [place] PT Goal Formulation: With patient/family Time For Goal Achievement: 06/19/23 Progress towards PT goals: Progressing toward goals    Frequency    Min 3X/week      PT Plan      Co-evaluation              AM-PAC PT "6 Clicks" Mobility   Outcome Measure  Help needed turning from your back to your side while in a flat bed without using bedrails?: A Lot Help needed moving from lying on your back to sitting on the side of a flat bed without using bedrails?: A Lot Help needed moving to and from a bed to a chair (including a wheelchair)?: A Little Help needed standing up from a chair using your arms (e.g., wheelchair or bedside chair)?: A Little Help needed to walk in hospital room?: A Little Help needed climbing 3-5 steps with a railing? : Total 6 Click Score: 14    End of Session Equipment Utilized During Treatment: Gait belt Activity Tolerance: Patient tolerated treatment well Patient left: with call bell/phone within reach;in chair Nurse Communication: Mobility status PT Visit Diagnosis: Other abnormalities of gait and mobility (R26.89);Difficulty in walking, not elsewhere classified (R26.2)     Time: 8119-1478 PT Time Calculation (min) (ACUTE ONLY): 23 min  Charges:    $Gait Training: 8-22 mins $Therapeutic Activity: 8-22 mins PT General Charges $$ ACUTE PT VISIT: 1 Visit                     Bogdan Vivona R. PTA Acute Rehabilitation Services Office: [phone]   Catalina Antigua 06/18/2023, 12:18 PM

## 2023-06-18 NOTE — Progress Notes (Addendum)
      [address]       [place] [address]             [phone]        4 Days Post-Op Procedure(s) (LRB): COLONOSCOPY (N/A) BIOPSY,Colon TATTOOING, USING INTRADERMAL PIGMENT  Subjective: Patient ordering breakfast this am. No bloody bowel movements. He ambulated yesterday. He has no specific complaint this am  Objective: Vital signs in last 24 hours: Temp:  [98.1 F (36.7 C)-98.6 F (37 C)] 98.1 F (36.7 C) (03/12 0348) Pulse Rate:  [80-89] 89 (03/12 0348) Cardiac Rhythm: Atrial fibrillation (03/12 0340) Resp:  [18-20] 20 (03/12 0348) BP: (106-122)/(50-76) 122/72 (03/12 0348) SpO2:  [96 %-100 %] 100 % (03/12 0348)  Pre op weight  93 kg Current Weight  06/17/23 86.9 kg      Intake/Output from previous day: 03/11 0701 - 03/12 0700 In: 550 [P.O.:550] Out: 1200 [Urine:1200]   Physical Exam:  Cardiovascular: IRRR IRRR Pulmonary: Clear to auscultation bilaterally Abdomen: Soft, non tender, bowel sounds present. Extremities: No lower extremity edema. Wounds: Clean and dry.  No erythema or signs of infection. Steri strips intact left PPM wound  Lab Results: CBC: Recent Labs    06/16/23 0322 06/17/23 0359  WBC 8.9 9.6  HGB 9.3* 10.5*  HCT 28.5* 32.2*  PLT 166 200   BMET:  Recent Labs    06/16/23 0322 06/17/23 0359  NA 139 138  K 3.3* 4.0  CL 97* 108  CO2 19* 23  GLUCOSE 127* 134*  BUN 12 15  CREATININE 1.25* 1.10  CALCIUM 6.5* 7.3*    PT/INR:  Lab Results  Component Value Date   INR 3.4 (H) 06/09/2023   INR 1.7 (H) 05/27/2023   INR 1.2 05/23/2023   ABG:  INR: Will add last result for INR, ABG once components are confirmed Will add last 4 CBG results once components are confirmed  Assessment/Plan: CV-History of persistent a fib. A fib with controlled rate (HR in the 80's.) On Amiodarone 400 mg bid, Digoxin 0.125 mg daily, Apixaban 2.5 mg bid, and.ec asa 81 mg daily.  2. Pulmonary-On room air. Encourage incentive  spirometer  3. GI-s/p colonoscopy 03/08. Results showed diverticula on right and left colon, patchy inflammation, multiple sessile polyps and linear ulcerations (consistent with ischemic colitis), one large lesion distal transverse colon biopsied (removal as an outpatient). Biopsy result showed hepatic flexure biopsy negative for dysplasia or malignancy and distal transverse colon biopsy tubular adenoma (negative for high grade dysplasia). 4. H and H yesterday 10.5 and 32.2. Await this am's result 5. As discussed with Dr. Leafy Ro, remove PICC line 6. Deconditioned-PT/OT. To CIR today  Dam Ashraf M ZimmermanPA-C 6:51 AM

## 2023-06-18 NOTE — H&P (Signed)
 Physical Medicine and Rehabilitation Admission H&P     HPI: [name] [name]. [name] [name] is a 78 year old right-handed male with history of hypertension, hyperlipidemia, chronic UTI, paroxysmal atrial fibrillation/MR/TR status post ablation 2012 maintained on Xarelto, prostate cancer with Holmium laser/prostatectomy 2005, TIA, glaucoma.  Per chart review patient lives with spouse.  Two-level home bed and bath main level with 2 steps to entry.  Reportedly independent prior to admission.  Presented 05/27/2023 with symptomatic severe MR, TR and atrial fibrillation.  Initially seen by cardiology services Dr.Thukkani and decided against a mitral valve clip therapy.  Underwent complex MV and TV repair with full left and right side MAZE procedure with radiofrequency and cryo lesions 05/27/2023 per Dr. Leafy Ro followed by [facility], PPM placement 2/25.  Hospital course complicated by GIB, A-fib with RVR, hemorrhagic shock with hypotension.  Patient underwent colonoscopy on 3/8 per Dr. Ned Grace which was suspicious for ischemic colitis as source of bleeding.  1 lesion was biopsied and eventually need to be resected.Marland Kitchen  Hemoglobin /hematocrit remained stable and gastroenterology services has signed off.  Patient has been cleared to remain on Eliquis therapy.  Cardiac rate remained controlled as he remains on amiodarone as well as Lanoxin.  Patient has had bouts of leukocytosis workup unremarkable felt to be possibly reactive maintained on initial empiric Zosyn which has been since discontinued and latest WBC of 8900.  Tolerating a regular consistency diet.  Therapy evaluations completed working with energy conservation techniques.  Due to patient's debility was admitted for a comprehensive rehab program.  Review of Systems  Constitutional:  Negative for fever.  HENT:  Negative for hearing loss.   Eyes:  Negative for blurred vision and double vision.  Respiratory:  Positive for shortness of breath. Negative for cough and  wheezing.   Cardiovascular:  Positive for palpitations and leg swelling. Negative for chest pain.  Gastrointestinal:  Positive for constipation. Negative for heartburn, nausea and vomiting.  Genitourinary:  Negative for dysuria, flank pain and hematuria.  Musculoskeletal:  Positive for joint pain and myalgias.  Skin:  Negative for rash.  Neurological:  Positive for weakness.  All other systems reviewed and are negative.  Past Medical History:  Diagnosis Date   Arthritis    Back   CVA (cerebral infarction) 07/14/2009   TIA   Dysrhythmia    A. Fib   Heart murmur    History of kidney stones    Hyperlipidemia    Hypertension    Nephrolithiasis    Paroxysmal atrial fibrillation (HCC)    Paroxysmal atrial flutter (HCC)    a. s/p ablation 2012.   Prostate cancer [facility])    Stroke [facility])    TIA - no deficits   Past Surgical History:  Procedure Laterality Date   APPENDECTOMY  1967   BONE BIOPSY  06/14/2023   Procedure: BIOPSY,Colon;  Surgeon: Benancio Deeds, MD;  Location: [facility] ENDOSCOPY;  Service: Gastroenterology;;   CARDIOVERSION N/A 06/03/2023   Procedure: CARDIOVERSION;  Surgeon: Jodelle Red, MD;  Location: [facility] INVASIVE CV LAB;  Service: Cardiovascular;  Laterality: N/A;   CATARACT EXTRACTION W/ INTRAOCULAR LENS IMPLANT Bilateral    CERVICAL SPINE SURGERY     Cadaver Bone Placed   COLONOSCOPY     COLONOSCOPY N/A 06/14/2023   Procedure: COLONOSCOPY;  Surgeon: Benancio Deeds, MD;  Location: [facility] ENDOSCOPY;  Service: Gastroenterology;  Laterality: N/A;   CYSTOSCOPY/URETEROSCOPY/HOLMIUM LASER/STENT PLACEMENT Left 09/19/2020   Procedure: CYSTOSCOPY RETROGRADE PYELOGRAM URETEROSCOPY/HOLMIUM LASER/STENT PLACEMENT;  Surgeon: Bjorn Pippin, MD;  Location: [facility] ORS;  Service: Urology;  Laterality: Left;   EYE SURGERY Bilateral    Eye Lid lifting   LUMBAR SPINE SURGERY     Ruptured Disc   MAZE N/A 05/27/2023   Procedure: MAZE;  Surgeon: Eugenio Hoes, MD;  Location: [facility] OR;   Service: Open Heart Surgery;  Laterality: N/A;   MITRAL VALVE REPAIR N/A 05/27/2023   Procedure: MITRAL VALVE REPAIR USING MEDTRONIC SIMULUS SEMI-RIDGID ANNULOPLASTY BAND (MVR);  Surgeon: Eugenio Hoes, MD;  Location: [facility] OR;  Service: Open Heart Surgery;  Laterality: N/A;   PACEMAKER IMPLANT N/A 06/03/2023   Procedure: PACEMAKER IMPLANT;  Surgeon: Marinus Maw, MD;  Location: [facility] INVASIVE CV LAB;  Service: Cardiovascular;  Laterality: N/A;   PROSTATECTOMY  2005   RIGHT/LEFT HEART CATH AND CORONARY ANGIOGRAPHY N/A 02/27/2023   Procedure: RIGHT/LEFT HEART CATH AND CORONARY ANGIOGRAPHY;  Surgeon: [name], MD;  Location: [facility] INVASIVE CV LAB;  Service: Cardiovascular;  Laterality: N/A;   TEE WITHOUT CARDIOVERSION N/A 02/07/2023   Procedure: TRANSESOPHAGEAL ECHOCARDIOGRAM;  Surgeon: Thurmon Fair, MD;  Location: [facility] INVASIVE CV LAB;  Service: Cardiovascular;  Laterality: N/A;   TEE WITHOUT CARDIOVERSION N/A 05/27/2023   Procedure: TRANSESOPHAGEAL ECHOCARDIOGRAM (TEE);  Surgeon: Eugenio Hoes, MD;  Location: [facility] OR;  Service: Open Heart Surgery;  Laterality: N/A;   TRANSESOPHAGEAL ECHOCARDIOGRAM (CATH LAB) N/A 06/03/2023   Procedure: TRANSESOPHAGEAL ECHOCARDIOGRAM;  Surgeon: Jodelle Red, MD;  Location: [facility] INVASIVE CV LAB;  Service: Cardiovascular;  Laterality: N/A;   TRICUSPID VALVE REPLACEMENT N/A 05/27/2023   Procedure: TRICUSPID VALVE REPAIR USING EDWARDS MC3 TRICUSPID ANNULOPLASTY RING;  Surgeon: Eugenio Hoes, MD;  Location: [facility] OR;  Service: Open Heart Surgery;  Laterality: N/A;   Family History  Problem Relation Age of Onset   Heart disease Mother 76       died   Prostate cancer Father        alive   Testicular cancer Brother        died   Social History:  reports that he has never smoked. He has never used smokeless tobacco. He reports that he does not drink alcohol and does not use drugs. Allergies: No Known Allergies Medications Prior to Admission  Medication  Sig Dispense Refill   atorvastatin (LIPITOR) 20 MG tablet Take 1 tablet by mouth once daily 90 tablet 1   Doxylamine Succinate, Sleep, (SLEEP AID PO) Take 2 each by mouth daily as needed (sleep). chewables     furosemide (LASIX) 20 MG tablet Take 20 mg by mouth daily.     losartan (COZAAR) 25 MG tablet Take 25 mg by mouth daily.     metoprolol succinate (TOPROL-XL) 50 MG 24 hr tablet Take 50 mg by mouth daily. Take with or immediately following a meal.     rivaroxaban (XARELTO) 20 MG TABS tablet Take 20 mg by mouth at bedtime.     traMADol (ULTRAM) 50 MG tablet TAKE 1 TABLET BY MOUTH EVERY 6 HOURS AS NEEDED. 120 tablet 0   amiodarone (PACERONE) 400 MG tablet Take 200 mg by mouth 2 times daily for 7 days;then take 200 mg daily thereafter     apixaban (ELIQUIS) 2.5 MG TABS tablet Take 1 tablet (2.5 mg total) by mouth 2 (two) times daily.     aspirin EC 81 MG tablet Take 1 tablet (81 mg total) by mouth daily. Swallow whole.     digoxin (LANOXIN) 0.125 MG tablet Take 1 tablet (0.125 mg total) by mouth daily.  Fe Fum-Vit C-Vit B12-FA (TRIGELS-F FORTE) CAPS capsule Take 1 capsule by mouth daily.     pantoprazole (PROTONIX) 40 MG tablet Take 1 tablet (40 mg total) by mouth 2 (two) times daily.      Home: Home Living Family/patient expects to be discharged to:: Private residence Living Arrangements: Spouse/significant other Available Help at Discharge: Family, Available 24 hours/day Type of Home: House Home Access: Level entry Entrance Stairs-Number of Steps: 2 Home Layout: Two level, Able to live on main level with bedroom/bathroom Bathroom Shower/Tub: Psychologist, counselling, Engineer, manufacturing systems: Handicapped height Bathroom Accessibility: Yes Home Equipment: Other (comment) Additional Comments: has upright walker  Lives With: Spouse   Functional History: Prior Function Prior Level of Function : Independent/Modified Independent, Driving Mobility Comments: was walking independently  and assisting wife after TKA ADLs Comments: struggled with LB bathing and dressing   Functional Status:  Mobility: Bed Mobility Overal bed mobility: Needs Assistance Bed Mobility: Rolling, Sit to Sidelying Rolling: Min assist Sidelying to sit: Mod assist Supine to sit: HOB elevated, Mod assist Sit to supine: Mod assist Sit to sidelying: Min assist General bed mobility comments: cues for precautions and min A to manage LEs into bed Transfers Overall transfer level: Needs assistance Equipment used: None Transfers: Sit to/from Stand, Bed to chair/wheelchair/BSC Sit to Stand: Mod assist Bed to/from chair/wheelchair/BSC transfer type:: Step pivot Step pivot transfers: Mod assist General transfer comment: mod A to boost to stand with pt utilizing heart pillow at chest, good eccentric control when coming to sit Ambulation/Gait Ambulation/Gait assistance: Contact guard assist Gait Distance (Feet): 125 Feet Assistive device: Rolling walker (2 wheels) Gait Pattern/deviations: Step-through pattern, Decreased stride length General Gait Details: cues for larger strides as pt with tendecy for short shuffling steps initially, able to correct Gait velocity: decr Gait velocity interpretation: <1.8 ft/sec, indicate of risk for recurrent falls   ADL: ADL Overall ADL's : Needs assistance/impaired Eating/Feeding: Modified independent, Sitting Grooming: Wash/dry hands, Wash/dry face, Oral care, Set up, Sitting Upper Body Bathing: Minimal assistance, Sitting Lower Body Bathing: Maximal assistance, Sit to/from stand Upper Body Dressing : Minimal assistance, Sitting Upper Body Dressing Details (indicate cue type and reason): min assist due to lines Lower Body Dressing: Maximal assistance, Sitting/lateral leans Lower Body Dressing Details (indicate cue type and reason): to change socks Toilet Transfer: Moderate assistance, BSC/3in1 (step-pivot; HHA +1) Toilet Transfer Details (indicate cue type and  reason): simulated chair to bed Toileting- Clothing Manipulation and Hygiene: Total assistance, +2 for safety/equipment, Sit to/from stand General ADL Comments: Pt with continued decreased activity tolerance but with noted improvements over last skilled OT session.   Cognition: Cognition Orientation Level: Oriented X4 Cognition Arousal: Alert Behavior During Therapy: WFL for tasks assessed/performed      Physical Exam: Blood pressure 109/73, pulse 85, temperature 97.7 F (36.5 C), temperature source Oral, resp. rate 19, height 5\' 10"  (1.778 m), weight 86.5 kg, SpO2 97%. Gen: no distress, normal appearing HEENT: oral mucosa pink and moist, NCAT Cardio: Reg rate Chest: normal effort, normal rate of breathing Abd: soft, non-distended Ext: no edema Psych: pleasant, normal affect Skin:    Comments: Skin.  Clean dry and intact.  Neurological:     Comments: Patient is awake alert no acute distress oriented x 3 and follows commands. No focal deficits  Results for orders placed or performed during the hospital encounter of 05/27/23 (from the past 48 hours)  CBC     Status: Abnormal   Collection Time: 06/17/23  3:59 AM  Result Value Ref Range   WBC 9.6 4.0 - 10.5 K/uL   RBC 3.61 (L) 4.22 - 5.81 MIL/uL   Hemoglobin 10.5 (L) 13.0 - 17.0 g/dL   HCT 98.1 (L) 19.1 - 47.8 %   MCV 89.2 80.0 - 100.0 fL   MCH 29.1 26.0 - 34.0 pg   MCHC 32.6 30.0 - 36.0 g/dL   RDW 29.5 62.1 - 30.8 %   Platelets 200 150 - 400 K/uL   nRBC 0.0 0.0 - 0.2 %    Comment: Performed at [facility] Lab, [address] [place], [place] [address]  Basic metabolic panel     Status: Abnormal   Collection Time: 06/17/23  3:59 AM  Result Value Ref Range   Sodium 138 135 - 145 mmol/L   Potassium 4.0 3.5 - 5.1 mmol/L   Chloride 108 98 - 111 mmol/L   CO2 23 22 - 32 mmol/L   Glucose, Bld 134 (H) 70 - 99 mg/dL    Comment: Glucose reference range applies only to samples taken after fasting for at least 8 hours.   BUN  15 8 - 23 mg/dL   Creatinine, Ser 6.96 0.61 - 1.24 mg/dL   Calcium 7.3 (L) 8.9 - 10.3 mg/dL   GFR, Estimated >29 >52 mL/min    Comment: (NOTE) Calculated using the CKD-EPI Creatinine Equation (2021)    Anion gap 7 5 - 15    Comment: Performed at [facility], [address] [place], [place] [address]  Magnesium     Status: None   Collection Time: 06/17/23  3:59 AM  Result Value Ref Range   Magnesium 2.2 1.7 - 2.4 mg/dL    Comment: Performed at [facility], [address], [place], [place] [address]  CBC     Status: Abnormal   Collection Time: 06/18/23  5:00 AM  Result Value Ref Range   WBC 8.7 4.0 - 10.5 K/uL   RBC 3.75 (L) 4.22 - 5.81 MIL/uL   Hemoglobin 10.9 (L) 13.0 - 17.0 g/dL   HCT 27.2 (L) 53.6 - 64.4 %   MCV 89.9 80.0 - 100.0 fL   MCH 29.1 26.0 - 34.0 pg   MCHC 32.3 30.0 - 36.0 g/dL   RDW 03.4 74.2 - 59.5 %   Platelets 235 150 - 400 K/uL   nRBC 0.0 0.0 - 0.2 %    Comment: Performed at [facility], [address] [place], [place] [address]   No results found.     Blood pressure 109/73, pulse 85, temperature 97.7 F (36.5 C), temperature source Oral, resp. rate 19, height 5\' 10"  (1.778 m), weight 86.5 kg, SpO2 97%.  Medical Problem List and Plan: 1. Functional deficits secondary to debility after MVR/TVR/maze/GI bleed/hemorrhagic shock.  Sternal precautions  -patient may shower  -ELOS/Goals: 5-7 days S  Admit to CIR 2.  Antithrombotics: -DVT/anticoagulation:  Pharmaceutical: Eliquis  -antiplatelet therapy: Aspirin 81 mg daily 3. Pain Management: Tylenol as needed 4. Mood/Behavior/Sleep: Provide emotional support  -antipsychotic agents: N/A 5. Neuropsych/cognition: This patient is capable of making decisions on his own behalf. 6. Skin/Wound Care: Routine skin checks 7. Fluids/Electrolytes/Nutrition: Routine in and outs with follow-up chemistries 8.  GI bleed.  Status post colonoscopy 06/14/2023 results showed diverticula on right and left  colon patchy inflammation multiple sessile polyps and linear ulcerations consistent with ischemic colitis, 1 large lesion distal transverse colon biopsied (removal as an outpatient).  Biopsy pending.  continue Protonix 40 mg  twice daily.  Gastroenterology services have signed off  9.  Acute blood loss anemia.  Follow-up CBC  10.  Leukocytosis resolved.  Empiric Zosyn discontinued, check WBC tomorrow  11.  A-fib with RVR/DDCV/pacemaker 2/25.  Cardiac rate controlled.  Amiodarone 400 mg twice daily, Lanoxin 0.125 mg daily.  Cardiac rate controlled. Check magnesium level tomorrow.  12. Screening for vitamin D deficiency: check vitamin D level tomorrow  I have personally performed a face to face diagnostic evaluation, including, but not limited to relevant history and physical exam findings, of this patient and developed relevant assessment and plan.  Additionally, I have reviewed and concur with the physician assistant's documentation above.    Mcarthur Rossetti Angiulli, PA-C   Horton Chin, MD 06/18/2023

## 2023-06-18 NOTE — Progress Notes (Signed)
 [name] Oyster, MD  Physician Physical Medicine and Rehabilitation   Consult Note    Addendum   Date of Service: 06/16/2023  1:15 PM  Related encounter: Admission (Current) from 05/27/2023 in [facility] 4E CV SURGICAL PROGRESSIVE CARE   Expand All Collapse All  Show:Clear all [x] Written[x] Templated[] Copied  Added by: [x] [name] Oyster, MD  [] Hover for details          Physical Medicine and Rehabilitation Consult Reason for Consult:debility after MVR/TVR, MAZE Referring Physician: Leafy Ro     HPI: [name] [name] is a 78 y.o. male  with hx of Prostate CA, MR/TR/ afib who was admitted on 2/18 for severe MR and moderate TR who underwent complex MV and TV repair with full left and right-sided MAZE with radiofrequency and cry lesions.  Underwent DDCV,PPM placement on 2/25. Course complicated by GIB, A-fib with RVR, hemorrhagic shock with hypotension. Pt underwent colonoscopy on 3/8 which was suspicious for ischemic colitis as source of bleeding. One lesion was biopsied and will eventually need to be resected. Bleeding has stopped and GI signed off. Pt was up with therapies yesterday and was mod assist for sit-std transfers and walked 125' using RW at [facility] assist level.  Pt was min to max assist with basic ADL's. Pt was independent and driving prior to admit. He lives with spouse in one level home with steps to enter.          Review of Systems  Constitutional: Negative.   HENT: Negative.    Eyes: Negative.   Respiratory:  Positive for cough.   Cardiovascular:  Positive for chest pain and leg swelling.  Gastrointestinal:  Negative for heartburn.  Genitourinary: Negative.   Musculoskeletal: Negative.   Skin: Negative.   Neurological:  Positive for weakness.  Psychiatric/Behavioral: Negative.          Past Medical History:  Diagnosis Date   Arthritis      Back   CVA (cerebral infarction) 07/14/2009    TIA   Dysrhythmia      A. Fib   Heart murmur     History of kidney  stones     Hyperlipidemia     Hypertension     Nephrolithiasis     Paroxysmal atrial fibrillation (HCC)     Paroxysmal atrial flutter (HCC)      a. s/p ablation 2012.   Prostate cancer [facility])     Stroke [facility])      TIA - no deficits             Past Surgical History:  Procedure Laterality Date   APPENDECTOMY   1967   BONE BIOPSY   06/14/2023    Procedure: BIOPSY,Colon;  Surgeon: Benancio Deeds, MD;  Location: [facility] ENDOSCOPY;  Service: Gastroenterology;;   CARDIOVERSION N/A 06/03/2023    Procedure: CARDIOVERSION;  Surgeon: Jodelle Red, MD;  Location: [facility] INVASIVE CV LAB;  Service: Cardiovascular;  Laterality: N/A;   CATARACT EXTRACTION W/ INTRAOCULAR LENS IMPLANT Bilateral     CERVICAL SPINE SURGERY        Cadaver Bone Placed   COLONOSCOPY       COLONOSCOPY N/A 06/14/2023    Procedure: COLONOSCOPY;  Surgeon: Benancio Deeds, MD;  Location: [facility] ENDOSCOPY;  Service: Gastroenterology;  Laterality: N/A;   CYSTOSCOPY/URETEROSCOPY/HOLMIUM LASER/STENT PLACEMENT Left 09/19/2020    Procedure: CYSTOSCOPY RETROGRADE PYELOGRAM URETEROSCOPY/HOLMIUM LASER/STENT PLACEMENT;  Surgeon: Bjorn Pippin, MD;  Location: [facility] ORS;  Service: Urology;  Laterality: Left;   EYE SURGERY Bilateral  Eye Lid lifting   LUMBAR SPINE SURGERY        Ruptured Disc   MAZE N/A 05/27/2023    Procedure: MAZE;  Surgeon: Eugenio Hoes, MD;  Location: [facility] OR;  Service: Open Heart Surgery;  Laterality: N/A;   MITRAL VALVE REPAIR N/A 05/27/2023    Procedure: MITRAL VALVE REPAIR USING MEDTRONIC SIMULUS SEMI-RIDGID ANNULOPLASTY BAND (MVR);  Surgeon: Eugenio Hoes, MD;  Location: [facility] OR;  Service: Open Heart Surgery;  Laterality: N/A;   PACEMAKER IMPLANT N/A 06/03/2023    Procedure: PACEMAKER IMPLANT;  Surgeon: Marinus Maw, MD;  Location: [facility] INVASIVE CV LAB;  Service: Cardiovascular;  Laterality: N/A;   PROSTATECTOMY   2005   RIGHT/LEFT HEART CATH AND CORONARY ANGIOGRAPHY N/A 02/27/2023    Procedure:  RIGHT/LEFT HEART CATH AND CORONARY ANGIOGRAPHY;  Surgeon: [name], MD;  Location: [facility] INVASIVE CV LAB;  Service: Cardiovascular;  Laterality: N/A;   TEE WITHOUT CARDIOVERSION N/A 02/07/2023    Procedure: TRANSESOPHAGEAL ECHOCARDIOGRAM;  Surgeon: Thurmon Fair, MD;  Location: [facility] INVASIVE CV LAB;  Service: Cardiovascular;  Laterality: N/A;   TEE WITHOUT CARDIOVERSION N/A 05/27/2023    Procedure: TRANSESOPHAGEAL ECHOCARDIOGRAM (TEE);  Surgeon: Eugenio Hoes, MD;  Location: [facility] OR;  Service: Open Heart Surgery;  Laterality: N/A;   TRANSESOPHAGEAL ECHOCARDIOGRAM (CATH LAB) N/A 06/03/2023    Procedure: TRANSESOPHAGEAL ECHOCARDIOGRAM;  Surgeon: Jodelle Red, MD;  Location: [facility] INVASIVE CV LAB;  Service: Cardiovascular;  Laterality: N/A;   TRICUSPID VALVE REPLACEMENT N/A 05/27/2023    Procedure: TRICUSPID VALVE REPAIR USING EDWARDS MC3 TRICUSPID ANNULOPLASTY RING;  Surgeon: Eugenio Hoes, MD;  Location: [facility] OR;  Service: Open Heart Surgery;  Laterality: N/A;             Family History  Problem Relation Age of Onset   Heart disease Mother 48        died   Prostate cancer Father          alive   Testicular cancer Brother          died        Social History:  reports that he has never smoked. He has never used smokeless tobacco. He reports that he does not drink alcohol and does not use drugs. Allergies:  Allergies  No Known Allergies         Medications Prior to Admission  Medication Sig Dispense Refill   amoxicillin-clavulanate (AUGMENTIN) 500-125 MG tablet Take 1 tablet by mouth at bedtime.       atorvastatin (LIPITOR) 20 MG tablet Take 1 tablet by mouth once daily 90 tablet 1   furosemide (LASIX) 20 MG tablet Take 1 tablet (20 mg total) by mouth daily. (Patient taking differently: Take 20 mg by mouth daily as needed for fluid or edema.) 30 tablet 11   losartan (COZAAR) 25 MG tablet Take 1 tablet (25 mg total) by mouth daily. (Patient taking differently: Take 25 mg by  mouth every evening.) 90 tablet 3   metoprolol succinate (TOPROL-XL) 50 MG 24 hr tablet Take 1 tablet (50 mg total) by mouth at bedtime. Take with or immediately following a meal. (Patient taking differently: Take 75 mg by mouth at bedtime. Take with or immediately following a meal.) 90 tablet 3   rivaroxaban (XARELTO) 20 MG TABS tablet TAKE 1 TABLET BY MOUTH ONCE DAILY WITH SUPPER 90 tablet 1   traMADol (ULTRAM) 50 MG tablet TAKE 1 TABLET BY MOUTH EVERY 6 HOURS AS NEEDED. 120 tablet 0  Home: Home Living Family/patient expects to be discharged to:: Private residence Living Arrangements: Spouse/significant other Available Help at Discharge: Family, Available 24 hours/day Type of Home: House Home Access: Level entry Entrance Stairs-Number of Steps: 2 Home Layout: Two level, Able to live on main level with bedroom/bathroom Bathroom Shower/Tub: Psychologist, counselling, Engineer, manufacturing systems: Handicapped height Bathroom Accessibility: Yes Home Equipment: Other (comment) Additional Comments: has upright walker  Lives With: Spouse  Functional History: Prior Function Prior Level of Function : Independent/Modified Independent, Driving Mobility Comments: was walking independently and assisting wife after TKA ADLs Comments: struggled with LB bathing and dressing Functional Status:  Mobility: Bed Mobility Overal bed mobility: Needs Assistance Bed Mobility: Rolling, Sit to Sidelying Rolling: Min assist Sidelying to sit: Mod assist Supine to sit: HOB elevated, Mod assist Sit to supine: Mod assist Sit to sidelying: Min assist General bed mobility comments: cues for precautions and min A to manage LEs into bed Transfers Overall transfer level: Needs assistance Equipment used: None Transfers: Sit to/from Stand, Bed to chair/wheelchair/BSC Sit to Stand: Mod assist Bed to/from chair/wheelchair/BSC transfer type:: Step pivot Step pivot transfers: Mod assist General transfer  comment: mod A to boost to stand with pt utilizing heart pillow at chest, good eccentric control when coming to sit Ambulation/Gait Ambulation/Gait assistance: Contact guard assist Gait Distance (Feet): 125 Feet Assistive device: Rolling walker (2 wheels) Gait Pattern/deviations: Step-through pattern, Decreased stride length General Gait Details: cues for larger strides as pt with tendecy for short shuffling steps initially, able to correct Gait velocity: decr Gait velocity interpretation: <1.8 ft/sec, indicate of risk for recurrent falls   ADL: ADL Overall ADL's : Needs assistance/impaired Eating/Feeding: Modified independent, Sitting Grooming: Wash/dry hands, Wash/dry face, Oral care, Set up, Sitting Upper Body Bathing: Minimal assistance, Sitting Lower Body Bathing: Maximal assistance, Sit to/from stand Upper Body Dressing : Minimal assistance, Sitting Upper Body Dressing Details (indicate cue type and reason): min assist due to lines Lower Body Dressing: Maximal assistance, Sitting/lateral leans Lower Body Dressing Details (indicate cue type and reason): to change socks Toilet Transfer: Moderate assistance, BSC/3in1 (step-pivot; HHA +1) Toilet Transfer Details (indicate cue type and reason): simulated chair to bed Toileting- Clothing Manipulation and Hygiene: Total assistance, +2 for safety/equipment, Sit to/from stand General ADL Comments: Pt with continued decreased activity tolerance but with noted improvements over last skilled OT session.   Cognition: Cognition Orientation Level: Oriented X4 Cognition Arousal: Alert Behavior During Therapy: WFL for tasks assessed/performed   Blood pressure (!) 88/66, pulse 80, temperature 97.9 F (36.6 C), temperature source Oral, resp. rate (!) 27, height 5\' 10"  (1.778 m), weight 87.3 kg, SpO2 98%. Physical Exam Constitutional:      Appearance: He is not ill-appearing.  HENT:     Head: Normocephalic and atraumatic.     Mouth/Throat:      Mouth: Mucous membranes are moist.  Eyes:     Pupils: Pupils are equal, round, and reactive to light.  Cardiovascular:     Rate and Rhythm: Normal rate.  Pulmonary:     Effort: Pulmonary effort is normal.  Abdominal:     Palpations: Abdomen is soft.  Musculoskeletal:     Cervical back: Normal range of motion.     Right lower leg: Edema present.     Left lower leg: Edema present.  Skin:    General: Skin is warm and dry.     Comments: Incision CDI  Neurological:     Mental Status: He is alert.  Comments: Alert and oriented x 3. Normal insight and awareness. Intact Memory. Normal language and speech. Cranial nerve exam unremarkable. MMT: 4/5 UE prox to disal. LE 3+/5 HF, KE and 4/5 ADF/PF. Sensory exam normal for light touch and pain in all 4 limbs. No limb ataxia or cerebellar signs. No abnormal tone appreciated.  Marland Kitchen    Psychiatric:        Mood and Affect: Mood normal.        Behavior: Behavior normal.        Lab Results Last 24 Hours       Results for orders placed or performed during the hospital encounter of 05/27/23 (from the past 24 hours)  Glucose, capillary     Status: None    Collection Time: 06/15/23  4:26 PM  Result Value Ref Range    Glucose-Capillary 93 70 - 99 mg/dL  CBC     Status: Abnormal    Collection Time: 06/16/23  3:22 AM  Result Value Ref Range    WBC 8.9 4.0 - 10.5 K/uL    RBC 3.21 (L) 4.22 - 5.81 MIL/uL    Hemoglobin 9.3 (L) 13.0 - 17.0 g/dL    HCT 16.1 (L) 09.6 - 52.0 %    MCV 88.8 80.0 - 100.0 fL    MCH 29.0 26.0 - 34.0 pg    MCHC 32.6 30.0 - 36.0 g/dL    RDW 04.5 (H) 40.9 - 15.5 %    Platelets 166 150 - 400 K/uL    nRBC 0.0 0.0 - 0.2 %  Basic metabolic panel     Status: Abnormal    Collection Time: 06/16/23  3:22 AM  Result Value Ref Range    Sodium 139 135 - 145 mmol/L    Potassium 3.3 (L) 3.5 - 5.1 mmol/L    Chloride 97 (L) 98 - 111 mmol/L    CO2 19 (L) 22 - 32 mmol/L    Glucose, Bld 127 (H) 70 - 99 mg/dL    BUN 12 8 - 23 mg/dL     Creatinine, Ser 8.11 (H) 0.61 - 1.24 mg/dL    Calcium 6.5 (L) 8.9 - 10.3 mg/dL    GFR, Estimated 58 (L) >60 mL/min    Anion gap 23 (H) 5 - 15  Magnesium     Status: None    Collection Time: 06/16/23  3:22 AM  Result Value Ref Range    Magnesium 1.7 1.7 - 2.4 mg/dL  Phosphorus     Status: Abnormal    Collection Time: 06/16/23  3:22 AM  Result Value Ref Range    Phosphorus 2.4 (L) 2.5 - 4.6 mg/dL  Glucose, capillary     Status: None    Collection Time: 06/16/23  6:35 AM  Result Value Ref Range    Glucose-Capillary 73 70 - 99 mg/dL    Comment 1 Notify RN      Comment 2 Document in Chart         Imaging Results (Last 48 hours)  DG Chest 2 View Result Date: 06/16/2023 CLINICAL DATA:  [id number] S/P MVR (mitral valve repair) [id number]. Shortness of breath. Chest pain. EXAM: CHEST - 2 VIEW COMPARISON:  06/12/2023. FINDINGS: Bilateral lung fields are clear. No acute consolidation or lung collapse. There are probable left retrocardiac atelectatic changes. Bilateral costophrenic angles are clear. Stable mildly enlarged cardio-mediastinal silhouette. Left-sided ICD device noted. Left atrial appendage closure device and prosthetic tricuspid/mitral valves noted. No acute osseous abnormalities. The soft tissues are within normal limits.  Lower cervical spinal fixation hardware noted. IMPRESSION: *No active cardiopulmonary disease. Electronically Signed   By: Jules Schick M.D.   On: 06/16/2023 08:20       Assessment/Plan: Diagnosis: 78 yo male with debility after MVR/TVR/MAZE with additional post-op complications.  Does the need for close, 24 hr/day medical supervision in concert with the patient's rehab needs make it unreasonable for this patient to be served in a less intensive setting? Yes Co-Morbidities requiring supervision/potential complications:  -?ischemic colitis with associated GI bleed -paroxysmal a fib -ess hypertension -prostate cancer Due to bladder management, bowel management, safety,  skin/wound care, disease management, medication administration, pain management, and patient education, does the patient require 24 hr/day rehab nursing? Yes Does the patient require coordinated care of a physician, rehab nurse, therapy disciplines of PT, OT to address physical and functional deficits in the context of the above medical diagnosis(es)? Yes Addressing deficits in the following areas: balance, endurance, locomotion, strength, transferring, bowel/bladder control, bathing, dressing, feeding, grooming, toileting, and psychosocial support Can the patient actively participate in an intensive therapy program of at least 3 hrs of therapy per day at least 5 days per week? Yes The potential for patient to make measurable gains while on inpatient rehab is excellent Anticipated functional outcomes upon discharge from inpatient rehab are modified independent  with PT, modified independent and supervision with OT, n/a with SLP. Estimated rehab length of stay to reach the above functional goals is: 7 days Anticipated discharge destination: Home Overall Rehab/Functional Prognosis: excellent   POST ACUTE RECOMMENDATIONS: This patient's condition is appropriate for continued rehabilitative care in the following setting: CIR Patient has agreed to participate in recommended program. Yes Note that insurance prior authorization may be required for reimbursement for recommended care.   Comment: Pt lives with his wife who struggles with mobility issues herself. Mr. Keldrick needs to be essentially modified independent to return home which we can accomplish on inpatient rehab while working him to observe sternal and pacer precautions. Furthermore, rehab provides a venue where we can monitor his GI issues/bleeding as anticoagulation is resumed. Rehab Admissions Coordinator to follow up.       I have personally performed a face to face diagnostic evaluation of this patient. Additionally, I have examined the  patient's medical record including any pertinent labs and radiographic images.     Thanks,   [name] Oyster, MD 06/16/2023      Revision History  Routing History

## 2023-06-18 NOTE — Progress Notes (Signed)
 Horton Chin, MD  Physician Physical Medicine and Rehabilitation   PMR Pre-admission    Signed   Date of Service: 06/14/2023  3:31 PM  Related encounter: Admission (Current) from 05/27/2023 in [facility] 4E CV SURGICAL PROGRESSIVE CARE   Signed     Expand All Collapse All  Show:Clear all [x] Written[x] Templated[x] Copied  Added by: [x] Standley Brooking, RN[x] Theodoro Kos, Lauren Demetrius Charity, CCC-SLP  [] Hover for details PMR Admission Coordinator Pre-Admission Assessment   Patient: [name] [name] is an 78 y.o., male MRN: [id number] DOB: 01/02/1946 Height: 5\' 10"  (177.8 cm) Weight: 86.9 kg                                                                                                                                               Insurance Information HMO: yes    PPO:      PCP:      IPA:      80/20:      OTHER:  PRIMARY: BCBS Medicare      Policy#: [id number]      Subscriber: part CM Name: Carollee Herter      Phone#: [phone]     Fax#: [phone] Pre-Cert#: [id number] approved for 7 days 3/12 until 3/19     Employer:  Benefits:  Phone #: [phone]     Name:  Eff. Date: 04/09/23-04/07/2198     Deduct: does not have one      Out of Pocket Max: $3,150 ($141.52 met)      Life Max: NA  CIR: $335/day co-pay for days 1-5      SNF: 100% coverage for days 1-20, $214 co-pay/day for days 21-60, 100% coverage for days 61-100 Outpatient: $10 co-pay/visit     Co-Pay:  Home Health: 100% coverage      Co-Pay:  DME: 80% coverage     Co-Pay: 20% co-insurance Providers: in-network  SECONDARY:       Policy#:       Phone#:    Artist:       Phone#:    The Data processing manager" for patients in Inpatient Rehabilitation Facilities with attached "Privacy Act Statement-Health Care Records" was provided and verbally reviewed with: Patient   Emergency Contact Information Contact Information       Name Relation Home Work [place] Other     [phone]     [facility] Daughter [phone]   [phone]         Other Contacts   None on File      Current Medical History  Patient Admitting Diagnosis: cardiac debility   History of Present Illness:   [name] Nearing. [name] is a 78 year old right-handed male with history of hypertension, hyperlipidemia, chronic UTI, paroxysmal atrial fibrillation/MR/TR status post ablation 2012 maintained on Xarelto, prostate cancer with Holmium laser/prostatectomy 2005, TIA, glaucoma.   Presented 05/27/2023 with symptomatic severe MR, TR  and atrial fibrillation.    Initially seen by cardiology services Dr.Thukkani and decided against a mitral valve clip therapy.  Underwent complex MV and TV repair with full left and right side MAZE procedure with radiofrequency and cryo lesions 05/27/2023 per Dr. Leafy Ro followed by [facility], PPM placement 2/25.  Hospital course complicated by GIB, A-fib with RVR, hemorrhagic shock with hypotension.  Patient underwent colonoscopy on 3/8 per Dr. Ned Grace which was suspicious for ischemic colitis as source of bleeding.  1 lesion was biopsied and eventually need to be resected.Marland Kitchen  Hemoglobin /hematocrit remained stable and gastroenterology services has signed off.  Patient has been cleared to remain on Eliquis therapy.  Cardiac rate remained controlled as he remains on amiodarone as well as Lanoxin.  Patient has had bouts of leukocytosis workup unremarkable felt to be possibly reactive maintained on initial empiric Zosyn which has been since discontinued and latest WBC of 8900.  Tolerating a regular consistency diet.    Patient's medical record from [facility] has been reviewed by the rehabilitation admission coordinator and physician.   Past Medical History      Past Medical History:  Diagnosis Date   Arthritis      Back   CVA (cerebral infarction) 07/14/2009    TIA   Dysrhythmia      A. Fib   Heart murmur     History of kidney stones     Hyperlipidemia     Hypertension      Nephrolithiasis     Paroxysmal atrial fibrillation (HCC)     Paroxysmal atrial flutter (HCC)      a. s/p ablation 2012.   Prostate cancer (HCC)     Stroke [facility])      TIA - no deficits        Has the patient had major surgery during 100 days prior to admission? Yes   Family History  family history includes Heart disease (age of onset: 43) in his mother; Prostate cancer in his father; Testicular cancer in his brother.   Current Medications   Current Medications    Current Facility-Administered Medications:    0.9 %  sodium chloride infusion (Manually program via Guardrails IV Fluids), , Intravenous, Once, Gaetana Michaelis, MD   0.9 %  sodium chloride infusion (Manually program via Guardrails IV Fluids), , Intravenous, Once, Gaetana Michaelis, MD   0.9 %  sodium chloride infusion (Manually program via Guardrails IV Fluids), , Intravenous, Once, Alleen Borne, MD   0.9 %  sodium chloride infusion (Manually program via Guardrails IV Fluids), , Intravenous, Once, Alleen Borne, MD   0.9 %  sodium chloride infusion, , Intravenous, Continuous, McMichael, Bayley M, PA-C, Last Rate: 10 mL/hr at 06/15/23 1700, Infusion Verify at 06/15/23 1700   amiodarone (PACERONE) tablet 400 mg, 400 mg, Oral, BID, Stehler, Oren Bracket, PA-C, 400 mg at 06/18/23 2130   apixaban (ELIQUIS) tablet 2.5 mg, 2.5 mg, Oral, BID, Doree Fudge M, PA-C, 2.5 mg at 06/18/23 8657   aspirin EC tablet 81 mg, 81 mg, Oral, Daily, Doree Fudge M, PA-C, 81 mg at 06/18/23 8469   Chlorhexidine Gluconate Cloth 2 % PADS 6 each, 6 each, Topical, Daily, Marinus Maw, MD, 6 each at 06/18/23 6295   [COMPLETED] digoxin (LANOXIN) tablet 0.25 mg, 0.25 mg, Oral, Once, 0.25 mg at 06/16/23 1107 **FOLLOWED BY** digoxin (LANOXIN) tablet 0.125 mg, 0.125 mg, Oral, Daily, Doree Fudge M, PA-C, 0.125 mg at 06/18/23 2841   Fe  Fum-Vit C-Vit B12-FA (TRIGELS-F FORTE) capsule 1 capsule, 1 capsule, Oral, Daily, Doree Fudge M, PA-C, 1 capsule at 06/18/23 4098   Gerhardt's butt cream, , Topical, PRN, Eugenio Hoes, MD   ondansetron [facility]) injection 4 mg, 4 mg, Intravenous, Q6H PRN, Marinus Maw, MD, 4 mg at 06/10/23 1416   Oral care mouth rinse, 15 mL, Mouth Rinse, PRN, Marinus Maw, MD   pantoprazole (PROTONIX) EC tablet 40 mg, 40 mg, Oral, BID, Doree Fudge M, PA-C, 40 mg at 06/18/23 1191   sodium chloride (OCEAN) 0.65 % nasal spray 1 spray, 1 spray, Each Nare, PRN, Massie Maroon, MD, 1 spray at 06/06/23 0526   sodium chloride flush (NS) 0.9 % injection 3 mL, 3 mL, Intravenous, Q12H, Stehler, Oren Bracket, PA-C, 3 mL at 06/18/23 4782   sodium chloride flush (NS) 0.9 % injection 3 mL, 3 mL, Intravenous, PRN, Jenny Reichmann, PA-C     Patients Current Diet:  Diet Order                  Diet Heart Room service appropriate? Yes; Fluid consistency: Thin  Diet effective now                       Precautions / Restrictions Precautions Precautions: Sternal, Fall, ICD/Pacemaker Precaution Booklet Issued: Yes (comment) Precaution/Restrictions Comments: able to recall precautions Restrictions Weight Bearing Restrictions Per Provider Order: Yes RUE Weight Bearing Per Provider Order: Non weight bearing LUE Weight Bearing Per Provider Order: Non weight bearing Other Position/Activity Restrictions: sternal precautions/ICD/pacemaker precautions    Has the patient had 2 or more falls or a fall with injury in the past year?No   Prior Activity Level Limited Community (1-2x/wk): drives, gets out of house ~1-2 days/weeks   Prior Functional Level Prior Function Prior Level of Function : Independent/Modified Independent, Driving Mobility Comments: was walking independently and assisting wife after TKA ADLs Comments: struggled with LB bathing and dressing   Self Care: Did the patient need help bathing, dressing, using the toilet or eating?  Independent   Indoor Mobility: Did the patient  need assistance with walking from room to room (with or without device)? Independent   Stairs: Did the patient need assistance with internal or external stairs (with or without device)? Independent   Functional Cognition: Did the patient need help planning regular tasks such as shopping or remembering to take medications? Independent   Patient Information Are you of Hispanic, Latino/a,or Spanish origin?: A. No, not of Hispanic, Latino/a, or Spanish origin What is your race?: A. White Do you need or want an interpreter to communicate with a doctor or health care staff?: 0. No   Patient's Response To:  Health Literacy and Transportation Is the patient able to respond to health literacy and transportation needs?: Yes Health Literacy - How often do you need to have someone help you when you read instructions, pamphlets, or other written material from your doctor or pharmacy?: Never In the past 12 months, has lack of transportation kept you from medical appointments or from getting medications?: No In the past 12 months, has lack of transportation kept you from meetings, work, or from getting things needed for daily living?: No   Home Assistive Devices / Equipment Home Equipment: Other (comment)   Prior Device Use: Indicate devices/aids used by the patient prior to current illness, exacerbation or injury? None of the above   Current Functional Level Cognition   Orientation Level: Oriented X4  Extremity Assessment (includes Sensation/Coordination)   Upper Extremity Assessment: Right hand dominant, Generalized weakness (tested within sternal and ICD/pacemaker precautions) LUE Deficits / Details: pacemaker precautions, full AROM elbow to hand LUE Coordination: decreased gross motor  Lower Extremity Assessment: Defer to PT evaluation     ADLs   Overall ADL's : Needs assistance/impaired Eating/Feeding: Modified independent, Sitting Grooming: Wash/dry hands, Wash/dry face, Oral care, Set  up, Sitting Upper Body Bathing: Minimal assistance, Sitting Lower Body Bathing: Maximal assistance, Sit to/from stand Upper Body Dressing : Minimal assistance, Sitting Upper Body Dressing Details (indicate cue type and reason): min assist due to lines Lower Body Dressing: Maximal assistance, Sitting/lateral leans Lower Body Dressing Details (indicate cue type and reason): to change socks Toilet Transfer: Moderate assistance, BSC/3in1 (step-pivot; HHA +1) Toilet Transfer Details (indicate cue type and reason): simulated chair to bed Toileting- Clothing Manipulation and Hygiene: Total assistance, +2 for safety/equipment, Sit to/from stand General ADL Comments: Pt with continued decreased activity tolerance but with noted improvements over last skilled OT session.     Mobility   Overal bed mobility: Needs Assistance Bed Mobility: Rolling, Sit to Sidelying Rolling: Min assist Sidelying to sit: Mod assist Supine to sit: HOB elevated, Mod assist Sit to supine: Mod assist Sit to sidelying: Min assist General bed mobility comments: cues for precautions and min A to manage LEs into bed     Transfers   Overall transfer level: Needs assistance Equipment used: None Transfers: Sit to/from Stand, Bed to chair/wheelchair/BSC Sit to Stand: Mod assist Bed to/from chair/wheelchair/BSC transfer type:: Step pivot Step pivot transfers: Mod assist General transfer comment: mod A to boost to stand with pt utilizing heart pillow at chest, good eccentric control when coming to sit     Ambulation / Gait / Stairs / Wheelchair Mobility   Ambulation/Gait Ambulation/Gait assistance: Contact guard assist Gait Distance (Feet): 125 Feet Assistive device: Rolling walker (2 wheels) Gait Pattern/deviations: Step-through pattern, Decreased stride length General Gait Details: cues for larger strides as pt with tendecy for short shuffling steps initially, able to correct Gait velocity: decr Gait velocity  interpretation: <1.8 ft/sec, indicate of risk for recurrent falls     Posture / Balance Balance Overall balance assessment: Needs assistance Sitting-balance support: No upper extremity supported, Feet supported Sitting balance-Leahy Scale: Fair Standing balance support: Single extremity supported, During functional activity Standing balance-Leahy Scale: Poor Standing balance comment: realint on UE support for dynamic tasks     Special needs/care consideration;  bladder leakage at baseline due to prostate issues      Previous Home Environment  Living Arrangements: Spouse/significant other  Lives With: Spouse Available Help at Discharge: Family, Available 24 hours/day Type of Home: House Home Layout: Two level, Able to live on main level with bedroom/bathroom Home Access: Level entry Entrance Stairs-Number of Steps: 2 Bathroom Shower/Tub: Psychologist, counselling, Engineer, manufacturing systems: Handicapped height Bathroom Accessibility: Yes How Accessible: Accessible via walker Home Care Services: No Additional Comments: has upright walker   Discharge Living Setting Plans for Discharge Living Setting: Patient's home Type of Home at Discharge: House Discharge Home Layout: Two level, Able to live on main level with bedroom/bathroom Alternate Level Stairs-Rails: Right, Left Alternate Level Stairs-Number of Steps: 3+1 down+1 up Discharge Home Access: Level entry Discharge Bathroom Shower/Tub: Walk-in shower, Tub/shower unit Discharge Bathroom Toilet: Handicapped height Discharge Bathroom Accessibility: Yes How Accessible: Accessible via walker Does the patient have any problems obtaining your medications?: No   Social/Family/Support Systems Anticipated Caregiver: Odette Horns (daughter), Public relations account executive (  wife) and pt's son Anticipated Caregiver's Contact Information: Kim: [phone]; [phone] Caregiver Availability: 24/7 Discharge Plan Discussed with Primary Caregiver:  Yes Is Caregiver In Agreement with Plan?: Yes Does Caregiver/Family have Issues with Lodging/Transportation while Pt is in Rehab?: No   Spouse has knee issues and he wants to return home asap to assist her   Goals Patient/Family Goal for Rehab: Mod I:PT,  Mod I-Supevision: OT Expected length of stay: 7 days Pt/Family Agrees to Admission and willing to participate: Yes Program Orientation Provided & Reviewed with Pt/Caregiver Including Roles  & Responsibilities: Yes   Decrease burden of Care through IP rehab admission: NA   Possible need for SNF placement upon discharge:Not anticipated   Patient Condition: This patient's condition remains as documented in the consult dated 06/16/23, in which the Rehabilitation Physician determined and documented that the patient's condition is appropriate for intensive rehabilitative care in an inpatient rehabilitation facility. Will admit to inpatient rehab today.   Preadmission Screen Completed By:  Domingo Pulse, CCC-SLP, 06/18/2023 10:59 AM ______________________________________________________________________   Discussed status with Dr. Carlis Abbott on 06/18/23 at 1058 and received approval for admission today.   Admission Coordinator:  Domingo Pulse, time 8119 Date 06/18/23            Revision History

## 2023-06-18 NOTE — Progress Notes (Signed)
 CARDIAC REHAB PHASE I     Post [facility] education including site care, restrictions, heart healthy diet, sternal precautions, IS use at home, exercise guidelines and CRP2 reviewed. All questions and concerns addressed. Will refer to [facility] for CRP2. Discharge to CIR today.   [id number] Woodroe Chen, RN BSN 06/18/2023 9:22 AM

## 2023-06-18 NOTE — Progress Notes (Signed)
 Inpatient Rehabilitation Admission Medication Review by a Pharmacist  A complete drug regimen review was completed for this patient to identify any potential clinically significant medication issues.  High Risk Drug Classes Is patient taking? Indication by Medication  Antipsychotic No   Anticoagulant Yes Apixaban - Afib  Antibiotic No   Opioid No   Antiplatelet No   Hypoglycemics/insulin No   Vasoactive Medication Yes Amiodarone, digoxin - Afib  Chemotherapy No   Other Yes Trigels - supplement Pantoprazole - Reflux      Type of Medication Issue Identified Description of Issue Recommendation(s)  Drug Interaction(s) (clinically significant)     Duplicate Therapy     Allergy     No Medication Administration End Date     Incorrect Dose     Additional Drug Therapy Needed     Significant med changes from prior encounter (inform family/care partners about these prior to discharge).    Other       Clinically significant medication issues were identified that warrant physician communication and completion of prescribed/recommended actions by midnight of the next day:  No  Name of provider notified for urgent issues identified:   Provider Method of Notification:     Pharmacist comments: Resume Atorvastatin   Time spent performing this drug regimen review (minutes):  20 minutes  Thank you Okey Regal, PharmD

## 2023-06-19 ENCOUNTER — Ambulatory Visit

## 2023-06-19 DIAGNOSIS — R5381 Other malaise: Secondary | ICD-10-CM | POA: Diagnosis not present

## 2023-06-19 LAB — COMPREHENSIVE METABOLIC PANEL
ALT: 18 U/L (ref 0–44)
AST: 19 U/L (ref 15–41)
Albumin: 2.2 g/dL — ABNORMAL LOW (ref 3.5–5.0)
Alkaline Phosphatase: 62 U/L (ref 38–126)
Anion gap: 5 (ref 5–15)
BUN: 16 mg/dL (ref 8–23)
CO2: 23 mmol/L (ref 22–32)
Calcium: 7.8 mg/dL — ABNORMAL LOW (ref 8.9–10.3)
Chloride: 107 mmol/L (ref 98–111)
Creatinine, Ser: 1.01 mg/dL (ref 0.61–1.24)
GFR, Estimated: >60 mL/min (ref >60)
Glucose, Bld: 90 mg/dL (ref 70–99)
Potassium: 3.9 mmol/L (ref 3.5–5.1)
Sodium: 135 mmol/L (ref 135–145)
Total Bilirubin: 0.7 mg/dL (ref 0.0–1.2)
Total Protein: 5.1 g/dL — ABNORMAL LOW (ref 6.5–8.1)

## 2023-06-19 LAB — MAGNESIUM: Magnesium: 1.9 mg/dL (ref 1.7–2.4)

## 2023-06-19 LAB — CBC WITH DIFFERENTIAL/PLATELET
Abs Immature Granulocytes: 0.05 10*3/uL (ref 0.00–0.07)
Basophils Absolute: 0.1 10*3/uL (ref 0.0–0.1)
Basophils Relative: 1 %
Eosinophils Absolute: 0.2 10*3/uL (ref 0.0–0.5)
Eosinophils Relative: 3 %
HCT: 33.6 % — ABNORMAL LOW (ref 39.0–52.0)
Hemoglobin: 10.8 g/dL — ABNORMAL LOW (ref 13.0–17.0)
Immature Granulocytes: 1 %
Lymphocytes Relative: 12 %
Lymphs Abs: 1 10*3/uL (ref 0.7–4.0)
MCH: 29.1 pg (ref 26.0–34.0)
MCHC: 32.1 g/dL (ref 30.0–36.0)
MCV: 90.6 fL (ref 80.0–100.0)
Monocytes Absolute: 0.9 10*3/uL (ref 0.1–1.0)
Monocytes Relative: 11 %
Neutro Abs: 6 10*3/uL (ref 1.7–7.7)
Neutrophils Relative %: 72 %
Platelets: 257 10*3/uL (ref 150–400)
RBC: 3.71 MIL/uL — ABNORMAL LOW (ref 4.22–5.81)
RDW: 14.8 % (ref 11.5–15.5)
WBC: 8.2 10*3/uL (ref 4.0–10.5)
nRBC: 0 % (ref 0.0–0.2)

## 2023-06-19 LAB — VITAMIN D 25 HYDROXY (VIT D DEFICIENCY, FRACTURES): Vit D, 25-Hydroxy: 18.09 ng/mL — ABNORMAL LOW (ref 30–100)

## 2023-06-19 MED ORDER — CHLORHEXIDINE GLUCONATE CLOTH 2 % EX PADS: 6.0000 | MEDICATED_PAD | Freq: Two times a day (BID) | CUTANEOUS | Status: DC

## 2023-06-19 MED ORDER — CHLORHEXIDINE GLUCONATE CLOTH 2 % EX PADS
6.0000 | MEDICATED_PAD | Freq: Every day | CUTANEOUS | Status: DC
  Administered 2023-06-19: 6 via TOPICAL

## 2023-06-19 MED ORDER — SODIUM CHLORIDE 0.9% FLUSH: 10.0000 mL | INTRAVENOUS | Status: DC | PRN

## 2023-06-19 MED ORDER — AMIODARONE HCL 200 MG PO TABS: 200.0000 mg | ORAL_TABLET | Freq: Every day | ORAL | Status: DC

## 2023-06-19 MED ORDER — AMIODARONE HCL 200 MG PO TABS
200.0000 mg | ORAL_TABLET | Freq: Two times a day (BID) | ORAL | Status: AC
  Administered 2023-06-19 – 2023-06-22 (×7): 200 mg via ORAL
  Filled 2023-06-19 (×7): qty 1

## 2023-06-19 NOTE — Progress Notes (Signed)
 Inpatient Rehabilitation Care Coordinator Assessment and Plan Patient Details  Name: [name] [name] MRN: [id number] Date of Birth: November 20, 1945  Today's Date: 06/19/2023  Hospital Problems: Principal Problem:   Debility  Past Medical History:  Past Medical History:  Diagnosis Date   Arthritis    Back   CVA (cerebral infarction) 07/14/2009   TIA   Dysrhythmia    A. Fib   Heart murmur    History of kidney stones    Hyperlipidemia    Hypertension    Nephrolithiasis    Paroxysmal atrial fibrillation (HCC)    Paroxysmal atrial flutter (HCC)    a. s/p ablation 2012.   Prostate cancer (HCC)    Stroke [facility])    TIA - no deficits   Past Surgical History:  Past Surgical History:  Procedure Laterality Date   APPENDECTOMY  1967   BONE BIOPSY  06/14/2023   Procedure: BIOPSY,Colon;  Surgeon: Benancio Deeds, MD;  Location: [facility] ENDOSCOPY;  Service: Gastroenterology;;   CARDIOVERSION N/A 06/03/2023   Procedure: CARDIOVERSION;  Surgeon: Jodelle Red, MD;  Location: [facility] INVASIVE CV LAB;  Service: Cardiovascular;  Laterality: N/A;   CATARACT EXTRACTION W/ INTRAOCULAR LENS IMPLANT Bilateral    CERVICAL SPINE SURGERY     Cadaver Bone Placed   COLONOSCOPY     COLONOSCOPY N/A 06/14/2023   Procedure: COLONOSCOPY;  Surgeon: Benancio Deeds, MD;  Location: [facility] ENDOSCOPY;  Service: Gastroenterology;  Laterality: N/A;   CYSTOSCOPY/URETEROSCOPY/HOLMIUM LASER/STENT PLACEMENT Left 09/19/2020   Procedure: CYSTOSCOPY RETROGRADE PYELOGRAM URETEROSCOPY/HOLMIUM LASER/STENT PLACEMENT;  Surgeon: Bjorn Pippin, MD;  Location: [facility] ORS;  Service: Urology;  Laterality: Left;   EYE SURGERY Bilateral    Eye Lid lifting   LUMBAR SPINE SURGERY     Ruptured Disc   MAZE N/A 05/27/2023   Procedure: MAZE;  Surgeon: Eugenio Hoes, MD;  Location: [facility] OR;  Service: Open Heart Surgery;  Laterality: N/A;   MITRAL VALVE REPAIR N/A 05/27/2023   Procedure: MITRAL VALVE REPAIR USING MEDTRONIC SIMULUS SEMI-RIDGID  ANNULOPLASTY BAND (MVR);  Surgeon: Eugenio Hoes, MD;  Location: [facility] OR;  Service: Open Heart Surgery;  Laterality: N/A;   PACEMAKER IMPLANT N/A 06/03/2023   Procedure: PACEMAKER IMPLANT;  Surgeon: Marinus Maw, MD;  Location: [facility] INVASIVE CV LAB;  Service: Cardiovascular;  Laterality: N/A;   PROSTATECTOMY  2005   RIGHT/LEFT HEART CATH AND CORONARY ANGIOGRAPHY N/A 02/27/2023   Procedure: RIGHT/LEFT HEART CATH AND CORONARY ANGIOGRAPHY;  Surgeon: [name], MD;  Location: [facility] INVASIVE CV LAB;  Service: Cardiovascular;  Laterality: N/A;   TEE WITHOUT CARDIOVERSION N/A 02/07/2023   Procedure: TRANSESOPHAGEAL ECHOCARDIOGRAM;  Surgeon: Thurmon Fair, MD;  Location: [facility] INVASIVE CV LAB;  Service: Cardiovascular;  Laterality: N/A;   TEE WITHOUT CARDIOVERSION N/A 05/27/2023   Procedure: TRANSESOPHAGEAL ECHOCARDIOGRAM (TEE);  Surgeon: Eugenio Hoes, MD;  Location: [facility] OR;  Service: Open Heart Surgery;  Laterality: N/A;   TRANSESOPHAGEAL ECHOCARDIOGRAM (CATH LAB) N/A 06/03/2023   Procedure: TRANSESOPHAGEAL ECHOCARDIOGRAM;  Surgeon: Jodelle Red, MD;  Location: [facility] INVASIVE CV LAB;  Service: Cardiovascular;  Laterality: N/A;   TRICUSPID VALVE REPLACEMENT N/A 05/27/2023   Procedure: TRICUSPID VALVE REPAIR USING EDWARDS MC3 TRICUSPID ANNULOPLASTY RING;  Surgeon: Eugenio Hoes, MD;  Location: [facility] OR;  Service: Open Heart Surgery;  Laterality: N/A;   Social History:  reports that he has never smoked. He has never used smokeless tobacco. He reports that he does not drink alcohol and does not use drugs.  Family / Support Systems Marital Status: Married  Patient Roles: Spouse, Parent, Other (Comment) (grandfather) Spouse/Significant Other: Agustin Cree [phone] Children: Kim-daughter [phone] son also who is local Other Supports: Friends and church members Anticipated Caregiver: Wife, daughter and son Ability/Limitations of Caregiver: Wife has had two TKR's and still has pain and knee issues uses a  walker, daughter and son work Engineer, structural Availability: 24/7 (Wife can be there but not physically assist) Family Dynamics: Close with fmaily who will pull together to assist him. He is hopeful he will do well and get to mod/i level by discharge.  Social History Preferred language: English Religion: Baptist Cultural Background: NA Education: HS Health Literacy - How often do you need to have someone help you when you read instructions, pamphlets, or other written material from your doctor or pharmacy?: Never Writes: Yes Employment Status: Retired Marine scientist Issues: No issues Guardian/Conservator: None-according to MD pt is capable of making his own decisions while here   Abuse/Neglect Abuse/Neglect Assessment Can Be Completed: Yes Physical Abuse: Denies Verbal Abuse: Denies Sexual Abuse: Denies Exploitation of patient/patient's resources: Denies Self-Neglect: Denies  Patient response to: Social Isolation - How often do you feel lonely or isolated from those around you?: Never  Emotional Status [name] affect, behavior and adjustment status: Pt is motivated to recover and get back to his independent level he is not one to ask for assist and was helping wife prior to admission. She can do for herself but uses a rolling walker due to her knee has never healed well Recent Psychosocial Issues: other health issues Psychiatric History: No history-issues seems to be coping appropriately and recovering from his heart surgery Substance Abuse History: NA  Patient / Family Perceptions, Expectations & Goals Pt/Family understanding of illness & functional limitations: Pt can explain his surgery and issues as result he is hopeful to be on the right path now and can recover and go home. He talks with the MD's involved and feels has a good understanding of his plan moving forward. Premorbid pt/family roles/activities: husband, father, retiree, grandfather, friend, etc Anticipated  changes in roles/activities/participation: resume Pt/family expectations/goals: Pt states: " I want to be able to take care of myself when I leave, I don't want to burden my wife."  [employer]: None Transportation available at discharge: self Is the patient able to respond to transportation needs?: Yes In the past 12 months, has lack of transportation kept you from medical appointments or from getting medications?: No In the past 12 months, has lack of transportation kept you from meetings, work, or from getting things needed for daily living?: No  Discharge Planning Living Arrangements: Spouse/significant other Support Systems: Spouse/significant other, Children, Friends/neighbors, Other relatives Type of Residence: Private residence [employer] Resources: Media planner (specify) Theatre manager Medicare) Surveyor, quantity Resources: Tree surgeon, Family Support Financial Screen Referred: No Living Expenses: Own Money Management: Patient, Spouse Does the patient have any problems obtaining your medications?: No Home Management: self and wife Patient/Family Preliminary Plans: Return home with wife who is there but can not assist due to her own health issues. Their daughter and son will assist around working. Will await team's evaluation and work on discharge needs. Care Coordinator Barriers to Discharge: Decreased caregiver support, Insurance for SNF coverage, Weight bearing restrictions Care Coordinator Anticipated Follow Up Needs: HH/OP  Clinical Impression Pleasant gentleman who is doing well and recovering from valve replacement surgery. His wife has health issues of her own and can not physically assist, their two children with assist when can. Await therapy evaluations and work on  discharge needs.  Lucy Chris 06/19/2023, 1:29 PM

## 2023-06-19 NOTE — Evaluation (Signed)
 Occupational Therapy Assessment and Plan  Patient Details  Name: [name] [name] MRN: [id number] Date of Birth: 1945/06/11  OT Diagnosis: muscle weakness (generalized) and decreased cardiovascular endurance Rehab Potential: Rehab Potential (ACUTE ONLY): Good ELOS: 5-7 days   Today's Date: 06/19/2023 OT Individual Time: 1105-1200 OT Individual Time Calculation (min): 55 min     Hospital Problem: Principal Problem:   Debility   Past Medical History:  Past Medical History:  Diagnosis Date   Arthritis    Back   CVA (cerebral infarction) 07/14/2009   TIA   Dysrhythmia    A. Fib   Heart murmur    History of kidney stones    Hyperlipidemia    Hypertension    Nephrolithiasis    Paroxysmal atrial fibrillation (HCC)    Paroxysmal atrial flutter (HCC)    a. s/p ablation 2012.   Prostate cancer (HCC)    Stroke [facility])    TIA - no deficits   Past Surgical History:  Past Surgical History:  Procedure Laterality Date   APPENDECTOMY  1967   BONE BIOPSY  06/14/2023   Procedure: BIOPSY,Colon;  Surgeon: Benancio Deeds, MD;  Location: [facility] ENDOSCOPY;  Service: Gastroenterology;;   CARDIOVERSION N/A 06/03/2023   Procedure: CARDIOVERSION;  Surgeon: Jodelle Red, MD;  Location: [facility] INVASIVE CV LAB;  Service: Cardiovascular;  Laterality: N/A;   CATARACT EXTRACTION W/ INTRAOCULAR LENS IMPLANT Bilateral    CERVICAL SPINE SURGERY     Cadaver Bone Placed   COLONOSCOPY     COLONOSCOPY N/A 06/14/2023   Procedure: COLONOSCOPY;  Surgeon: Benancio Deeds, MD;  Location: [facility] ENDOSCOPY;  Service: Gastroenterology;  Laterality: N/A;   CYSTOSCOPY/URETEROSCOPY/HOLMIUM LASER/STENT PLACEMENT Left 09/19/2020   Procedure: CYSTOSCOPY RETROGRADE PYELOGRAM URETEROSCOPY/HOLMIUM LASER/STENT PLACEMENT;  Surgeon: Bjorn Pippin, MD;  Location: [facility] ORS;  Service: Urology;  Laterality: Left;   EYE SURGERY Bilateral    Eye Lid lifting   LUMBAR SPINE SURGERY     Ruptured Disc   MAZE N/A 05/27/2023    Procedure: MAZE;  Surgeon: Eugenio Hoes, MD;  Location: [facility] OR;  Service: Open Heart Surgery;  Laterality: N/A;   MITRAL VALVE REPAIR N/A 05/27/2023   Procedure: MITRAL VALVE REPAIR USING MEDTRONIC SIMULUS SEMI-RIDGID ANNULOPLASTY BAND (MVR);  Surgeon: Eugenio Hoes, MD;  Location: [facility] OR;  Service: Open Heart Surgery;  Laterality: N/A;   PACEMAKER IMPLANT N/A 06/03/2023   Procedure: PACEMAKER IMPLANT;  Surgeon: Marinus Maw, MD;  Location: [facility] INVASIVE CV LAB;  Service: Cardiovascular;  Laterality: N/A;   PROSTATECTOMY  2005   RIGHT/LEFT HEART CATH AND CORONARY ANGIOGRAPHY N/A 02/27/2023   Procedure: RIGHT/LEFT HEART CATH AND CORONARY ANGIOGRAPHY;  Surgeon: [name], MD;  Location: [facility] INVASIVE CV LAB;  Service: Cardiovascular;  Laterality: N/A;   TEE WITHOUT CARDIOVERSION N/A 02/07/2023   Procedure: TRANSESOPHAGEAL ECHOCARDIOGRAM;  Surgeon: Thurmon Fair, MD;  Location: [facility] INVASIVE CV LAB;  Service: Cardiovascular;  Laterality: N/A;   TEE WITHOUT CARDIOVERSION N/A 05/27/2023   Procedure: TRANSESOPHAGEAL ECHOCARDIOGRAM (TEE);  Surgeon: Eugenio Hoes, MD;  Location: [facility] OR;  Service: Open Heart Surgery;  Laterality: N/A;   TRANSESOPHAGEAL ECHOCARDIOGRAM (CATH LAB) N/A 06/03/2023   Procedure: TRANSESOPHAGEAL ECHOCARDIOGRAM;  Surgeon: Jodelle Red, MD;  Location: [facility] INVASIVE CV LAB;  Service: Cardiovascular;  Laterality: N/A;   TRICUSPID VALVE REPLACEMENT N/A 05/27/2023   Procedure: TRICUSPID VALVE REPAIR USING EDWARDS MC3 TRICUSPID ANNULOPLASTY RING;  Surgeon: Eugenio Hoes, MD;  Location: [facility] OR;  Service: Open Heart Surgery;  Laterality: N/A;  Assessment & Plan Clinical Impression: [name] [name] is a 78 year old right-handed male with history of hypertension, hyperlipidemia, chronic UTI, paroxysmal atrial fibrillation/MR/TR status post ablation 2012 maintained on Xarelto, prostate cancer with Holmium laser/prostatectomy 2005, TIA, glaucoma. Per chart review patient  lives with spouse. Two-level home bed and bath main level with 2 steps to entry. Reportedly independent prior to admission. Presented 05/27/2023 with symptomatic severe MR, TR and atrial fibrillation. Initially seen by cardiology services Dr.Thukkani and decided against a mitral valve clip therapy. Underwent complex MV and TV repair with full left and right side MAZE procedure with radiofrequency and cryo lesions 05/27/2023 per Dr. Leafy Ro followed by [facility], PPM placement 2/25. Hospital course complicated by GIB, A-fib with RVR, hemorrhagic shock with hypotension. Patient underwent colonoscopy on 3/8 per Dr. Ned Grace which was suspicious for ischemic colitis as source of bleeding. 1 lesion was biopsied and eventually need to be resected.Marland Kitchen Hemoglobin /hematocrit remained stable and gastroenterology services has signed off. Patient has been cleared to remain on Eliquis therapy. Cardiac rate remained controlled as he remains on amiodarone as well as Lanoxin. Patient has had bouts of leukocytosis workup unremarkable felt to be possibly reactive maintained on initial empiric Zosyn which has been since discontinued and latest WBC of 8900. Tolerating a regular consistency diet. Therapy evaluations completed working with energy conservation techniques. Due to patient's debility was admitted for a comprehensive rehab program.  Patient transferred to CIR on 06/18/2023 .    Patient currently requires min with basic self-care skills secondary to decreased cardiorespiratoy endurance and decreased balance strategies.  Prior to hospitalization, patient could complete ADLs with independent .  Patient will benefit from skilled intervention to decrease level of assist with basic self-care skills and increase independence with basic self-care skills prior to discharge home with care partner.  Anticipate patient will require intermittent supervision and follow up home health.  OT - End of Session Activity Tolerance:  Tolerates 30+ min activity with multiple rests Endurance Deficit: Yes OT Assessment Rehab Potential (ACUTE ONLY): Good OT Patient demonstrates impairments in the following area(s): Balance;Safety;Endurance;Motor OT Basic ADL's Functional Problem(s): Grooming;Bathing;Dressing;Toileting OT Transfers Functional Problem(s): Toilet;Tub/Shower OT Additional Impairment(s): None OT Plan OT Intensity: Minimum of 1-2 x/day, 45 to 90 minutes OT Frequency: 5 out of 7 days OT Duration/Estimated Length of Stay: 5-7 days OT Treatment/Interventions: Balance/vestibular training;Discharge planning;Self Care/advanced ADL retraining;Therapeutic Activities;UE/LE Coordination activities;Disease mangement/prevention;Functional mobility training;Patient/family education;Skin care/wound managment;Therapeutic Exercise;Community reintegration;DME/adaptive equipment instruction;Neuromuscular re-education;Psychosocial support;UE/LE Strength taining/ROM OT Self Feeding Anticipated Outcome(s): mod I OT Basic Self-Care Anticipated Outcome(s): mod I OT Toileting Anticipated Outcome(s): mod I OT Bathroom Transfers Anticipated Outcome(s): mod I OT Recommendation Recommendations for Other Services: Therapeutic Recreation consult Therapeutic Recreation Interventions: Pet therapy Patient destination: Home Follow Up Recommendations: Home health OT Equipment Recommended: To be determined   OT Evaluation Precautions/Restrictions  Precautions Precautions: Sternal;Fall;ICD/Pacemaker Precaution/Restrictions Comments: able to recall precautions Restrictions Weight Bearing Restrictions Per Provider Order: No Other Position/Activity Restrictions: sternal precautions/ICD/pacemaker precautions Pain Pain Assessment Pain Scale: 0-10 Pain Score: 0-No pain Home Living/Prior Functioning Home Living Family/patient expects to be discharged to:: Private residence Living Arrangements: Spouse/significant other Available Help at  Discharge: Family, Available 24 hours/day Type of Home: House Home Access: Stairs to enter [employer] of Steps: 3 Entrance Stairs-Rails: Left, Right (cannot reach both at same time) Home Layout: Two level, Able to live on main level with bedroom/bathroom Bathroom Shower/Tub: Walk-in shower, Tub/shower unit (always use walk in shower) Bathroom Toilet: Handicapped height Bathroom Accessibility: Yes Additional Comments: has  shower seat built in  Lives With: Spouse IADL History Homemaking Responsibilities: Yes Meal Prep Responsibility: No Cleaning Responsibility: Secondary Homemaking Comments: takes care of 2 minature horses Current License: Yes Mode of Transportation:  (has pick up truck and car) Occupation: Full time employment 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, hospital, son, granddaughter, 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 Leisure and Hobbies: golf and taking care of horses, Prior Function Level of Independence: Independent with basic ADLs, Independent with gait, Independent with transfers  Able to Take Stairs?: Yes Driving: Yes Vocation: Full time employment Vision Baseline Vision/History: 0 No visual deficits Ability to See in Adequate Light: 0 Adequate Patient Visual Report: No change from baseline Vision Assessment?: No apparent visual deficits Perception  Perception: Within Functional Limits Praxis Praxis: WFL Cognition Cognition Overall Cognitive Status: Within Functional Limits for tasks assessed Arousal/Alertness: Awake/alert Orientation Level: Person;Place;Situation Person: Oriented Place: Oriented Situation: Oriented Memory: Appears intact Awareness: Appears intact Problem Solving: Appears intact Brief Interview for Mental Status (BIMS) Repetition of Three Words (First Attempt): 3 Temporal Orientation: Year: Correct Temporal Orientation: Month: Accurate within 5 days Temporal Orientation: Day: Correct Recall: "Sock": Yes, no  cue required Recall: "Blue": Yes, no cue required Recall: "Bed": Yes, after cueing ("a piece of furniture") BIMS Summary Score: 14 Sensation Sensation Light Touch: Appears Intact Hot/Cold: Appears Intact Proprioception: Appears Intact Stereognosis: Not tested Coordination Gross Motor Movements are Fluid and Coordinated: Yes (WNL within precautions) Fine Motor Movements are Fluid and Coordinated: Yes Motor  Motor Motor: Within Functional Limits;Other (comment) Motor - Skilled Clinical Observations: general weakness  Trunk/Postural Assessment  Cervical Assessment Cervical Assessment: Within Functional Limits Thoracic Assessment Thoracic Assessment: Within Functional Limits Lumbar Assessment Lumbar Assessment: Within Functional Limits  Balance Balance Balance Assessed: Yes Static Sitting Balance Static Sitting - Balance Support: Feet supported;No upper extremity supported Static Sitting - Level of Assistance: 6: Modified independent (Device/Increase time) Static Sitting - Comment/# of Minutes: seated EOB Dynamic Sitting Balance Dynamic Sitting - Balance Support: Feet supported Dynamic Sitting - Level of Assistance: 5: Stand by assistance Dynamic Sitting - Balance Activities: Forward lean/weight shifting;Reaching for objects Sitting balance - Comments: seated EOB Static Standing Balance Static Standing - Balance Support: During functional activity Static Standing - Level of Assistance: 5: Stand by assistance Dynamic Standing Balance Dynamic Standing - Balance Support: During functional activity Dynamic Standing - Level of Assistance: 4: Min assist (CGA) Dynamic Standing - Balance Activities: Reaching for objects Extremity/Trunk Assessment RUE Assessment RUE Assessment: Within Functional Limits Active Range of Motion (AROM) Comments: within sternal precautions General Strength Comments: within sternal precautions LUE Assessment LUE Assessment: Within Functional  Limits Active Range of Motion (AROM) Comments: within sternal precautions General Strength Comments: within sternal precautions  Care Tool Care Tool Self Care Eating   Eating Assist Level: Set up assist    Oral Care    Oral Care Assist Level: Supervision/Verbal cueing (in standing)    Bathing         Assist Level: Minimal Assistance - Patient > 75%    Upper Body Dressing(including orthotics)       Assist Level: Supervision/Verbal cueing    Lower Body Dressing (excluding footwear)     Assist for lower body dressing: Minimal Assistance - Patient > 75%    Putting on/Taking off footwear     Assist for footwear: Minimal Assistance - Patient > 75%       Care Tool Toileting Toileting activity   Assist for toileting: Minimal Assistance - Patient >  75%     Care Tool Bed Mobility Roll left and right activity   Roll left and right assist level: Supervision/Verbal cueing    Sit to lying activity   Sit to lying assist level: Supervision/Verbal cueing    Lying to sitting on side of bed activity   Lying to sitting on side of bed assist level: the ability to move from lying on the back to sitting on the side of the bed with no back support.: Supervision/Verbal cueing     Care Tool Transfers Sit to stand transfer   Sit to stand assist level: Contact Guard/Touching assist    Chair/bed transfer   Chair/bed transfer assist level: Contact Guard/Touching assist     Toilet transfer   Assist Level: Contact Guard/Touching assist     Care Tool Cognition  Expression of Ideas and Wants Expression of Ideas and Wants: 4. Without difficulty (complex and basic) - expresses complex messages without difficulty and with speech that is clear and easy to understand  Understanding Verbal and Non-Verbal Content Understanding Verbal and Non-Verbal Content: 4. Understands (complex and basic) - clear comprehension without cues or repetitions   Memory/Recall Ability Memory/Recall Ability :  Location of own room;Current season;Staff names and faces;That he or she is in a hospital/hospital unit   Refer to Care Plan for Long Term Goals  SHORT TERM GOAL WEEK 1 OT Short Term Goal 1 (Week 1): STG=LTG d/t ELOS  Recommendations for other services: Therapeutic Recreation  Pet therapy   Skilled Therapeutic Intervention ADL ADL Eating: Set up Where Assessed-Eating: Chair Grooming: Supervision/safety Where Assessed-Grooming: Standing at sink Upper Body Bathing: Supervision/safety Where Assessed-Upper Body Bathing: Sitting at sink Lower Body Bathing: Minimal assistance Where Assessed-Lower Body Bathing: Standing at sink;Sitting at sink Upper Body Dressing: Supervision/safety Where Assessed-Upper Body Dressing: Edge of bed Lower Body Dressing: Minimal assistance Where Assessed-Lower Body Dressing: Edge of bed;Standing at sink Toileting: Minimal assistance Where Assessed-Toileting: Bedside Commode;Teacher, adult education: Furniture conservator/restorer Method: Proofreader: Gaffer: Scientific laboratory technician Method: Ship broker: Information systems manager with back Film/video editor: Administrator, arts Method: Ambulating ADL Comments: Pt educated on ADL specific movements within sternal precautions in order to modify ways of completing ADL tasks. Pt standing to complete grooming around ~5 min with no LOB/SOB with no UE support Mobility  Bed Mobility Bed Mobility: Rolling Right;Rolling Left;Supine to Sit Rolling Right: Supervision/verbal cueing Rolling Left: Supervision/Verbal cueing Supine to Sit: Supervision/Verbal cueing Transfers Sit to Stand: Contact Guard/Touching assist Stand to Sit: Contact Guard/Touching assist   1:1 evaluation and treatment session initiated this date. OT roles, goals and purpose discussed with pt as well as therapy schedule. ADL completed this date with  levels of assist and compensatory strategies listed above. Pt would benefit from skilled OT in IPR setting in order to maximize independence with ADLs upon D/C.    Discharge Criteria: Patient will be discharged from OT if patient refuses treatment 3 consecutive times without medical reason, if treatment goals not met, if there is a change in medical status, if patient makes no progress towards goals or if patient is discharged from hospital.  The above assessment, treatment plan, treatment alternatives and goals were discussed and mutually agreed upon: by patient  Velia Meyer, OTD, OTR/L 06/19/2023, 12:33 PM

## 2023-06-19 NOTE — Discharge Instructions (Addendum)
 Inpatient Rehab Discharge Instructions  [name] [name] Discharge date and time: No discharge date for patient encounter.   Activities/Precautions/ Functional Status: Activity: activity as tolerated/sternal precautions Diet: regular diet Wound Care: Routine skin checks Functional status:  ___ No restrictions     ___ Walk up steps independently ___ 24/7 supervision/assistance   ___ Walk up steps with assistance ___ Intermittent supervision/assistance  ___ Bathe/dress independently ___ Walk with walker     _x__ Bathe/dress with assistance ___ Walk Independently    ___ Shower independently ___ Walk with assistance    ___ Shower with assistance ___ No alcohol     ___ Return to work/school ________  Special Instructions: No driving smoking or alcohol    COMMUNITY REFERRALS UPON DISCHARGE:    NEEDS CARDIAC REHAB AND REFERRAL WAS MADE WHILE ON ACUTE  Medical Equipment/Items Ordered:HAS EQUIPMENT FROM WIFE NON-NEEDED                                                 Agency/Supplier:NA   My questions have been answered and I understand these instructions. I will adhere to these goals and the provided educational materials after my discharge from the hospital.  Patient/Caregiver Signature _______________________________ Date __________  Clinician Signature _______________________________________ Date __________  Please bring this form and your medication list with you to all your follow-up doctor's appointments.

## 2023-06-19 NOTE — Progress Notes (Signed)
 PROGRESS NOTE   Subjective/Complaints:  Uncomfortable in groin from skin maceration Feels good otherwise No pain/just discomfort  Slept on and off-  Had male purewick which caused maceration and redness to skin has dropping due ot hx of prostate CA in past- uses bladder pads at home.    ROS:  Pt denies SOB, abd pain, CP, N/V/C/D, and vision changes   Objective:   No results found. Recent Labs    06/18/23 0500 06/19/23 0545  WBC 8.7 8.2  HGB 10.9* 10.8*  HCT 33.7* 33.6*  PLT 235 257   Recent Labs    06/17/23 0359 06/19/23 0545  NA 138 135  K 4.0 3.9  CL 108 107  CO2 23 23  GLUCOSE 134* 90  BUN 15 16  CREATININE 1.10 1.01  CALCIUM 7.3* 7.8*    Intake/Output Summary (Last 24 hours) at 06/19/2023 0851 Last data filed at 06/18/2023 2120 Gross per 24 hour  Intake 240 ml  Output --  Net 240 ml        Physical Exam: Vital Signs Blood pressure 112/64, pulse 96, temperature 98.6 F (37 C), temperature source Oral, resp. rate 18, height 5\' 10"  (1.778 m), weight 83.4 kg, SpO2 97%.   General: awake, alert, appropriate,  sitting up in bed eating breakfast; NAD HENT: conjugate gaze; oropharynx moist CV: regular rate- in Afib it sounds like- HR in 90's; no JVD Pulmonary: CTA B/L; no W/R/R- good air movement GI: soft, NT, ND, (+)BS- normoactive Psychiatric: appropriate Neurological: Ox3 Skin:Groin is slightly macerated in purewick shape and red/erythematous; Sterna incision and abd incision and pacemaker incision look good   Assessment/Plan: 1. Functional deficits which require 3+ hours per day of interdisciplinary therapy in a comprehensive inpatient rehab setting. Physiatrist is providing close team supervision and 24 hour management of active medical problems listed below. Physiatrist and rehab team continue to assess barriers to discharge/monitor patient progress toward functional and medical  goals  Care Tool:  Bathing              Bathing assist       Upper Body Dressing/Undressing Upper body dressing        Upper body assist      Lower Body Dressing/Undressing Lower body dressing            Lower body assist       Toileting Toileting    Toileting assist       Transfers Chair/bed transfer  Transfers assist           Locomotion Ambulation   Ambulation assist              Walk 10 feet activity   Assist           Walk 50 feet activity   Assist           Walk 150 feet activity   Assist           Walk 10 feet on uneven surface  activity   Assist           Wheelchair     Assist  Wheelchair 50 feet with 2 turns activity    Assist            Wheelchair 150 feet activity     Assist          Blood pressure 112/64, pulse 96, temperature 98.6 F (37 C), temperature source Oral, resp. rate 18, height 5\' 10"  (1.778 m), weight 83.4 kg, SpO2 97%.   Medical Problem List and Plan: 1. Functional deficits secondary to debility after MVR/TVR/maze/GI bleed/hemorrhagic shock.  Sternal precautions             -patient may shower             -ELOS/Goals: 5-7 days S             First day of evaluations- con't CIR PT and OT 2.  Antithrombotics: -DVT/anticoagulation:  Pharmaceutical: Eliquis             -antiplatelet therapy: Aspirin 81 mg daily 3. Pain Management: Tylenol as needed 4. Mood/Behavior/Sleep: Provide emotional support             -antipsychotic agents: N/A 5. Neuropsych/cognition: This patient is capable of making decisions on his own behalf. 6. Skin/Wound Care: Routine skin checks  3/13- has groin maceration that he came with- due to prior male purewick- will do skin protection treatment 7. Fluids/Electrolytes/Nutrition: Routine in and outs with follow-up chemistries 8.  GI bleed.  Status post colonoscopy 06/14/2023 results showed diverticula on right and left  colon patchy inflammation multiple sessile polyps and linear ulcerations consistent with ischemic colitis, 1 large lesion distal transverse colon biopsied (removal as an outpatient).  Biopsy pending.  continue Protonix 40 mg twice daily.  Gastroenterology services have signed off   9.  Acute blood loss anemia.  Follow-up CBC   3/13- is stable- con't to monitor 10.  Leukocytosis resolved.  Empiric Zosyn discontinued, check WBC tomorrow   11.  A-fib with RVR/DDCV/pacemaker 2/25.  Cardiac rate controlled.  Amiodarone 400 mg twice daily, Lanoxin 0.125 mg daily.  Cardiac rate controlled. Check magnesium level tomorrow.   3/13- Mg level 1.9- K+ 3.9-  12. Hx of prostate CA  3/13- wil get him bladder pads for bladder incontinence since prostate CA   I spent a total of 39    minutes on total care today- >50% coordination of care- due to  D/w pt after review of chart and H&P- also exam; d/w pt about bladder issues and skin and cardiac issues and d/w nursing about same issues  LOS: 1 days A FACE TO FACE EVALUATION WAS PERFORMED  Hilarie Sinha 06/19/2023, 8:51 AM

## 2023-06-19 NOTE — Discharge Summary (Signed)
 Physician Discharge Summary  Patient ID: [name] [name] MRN: [id number] DOB/AGE: [birth year] 80 y.o.  Admit date: 06/18/2023 Discharge date: 06/24/2023  Discharge Diagnoses:  Principal Problem:   Debility DVT prophylaxis GI bleed Acute blood loss anemia A-fib with RVR/DDcv/pacemaker Leukocytosis-resolved Chronic UTI Glaucoma Hyperlipidemia  Discharged Condition: Stable  Significant Diagnostic Studies: DG Chest 2 View Result Date: 06/16/2023 CLINICAL DATA:  [id number] S/P MVR (mitral valve repair) [id number]. Shortness of breath. Chest pain. EXAM: CHEST - 2 VIEW COMPARISON:  06/12/2023. FINDINGS: Bilateral lung fields are clear. No acute consolidation or lung collapse. There are probable left retrocardiac atelectatic changes. Bilateral costophrenic angles are clear. Stable mildly enlarged cardio-mediastinal silhouette. Left-sided ICD device noted. Left atrial appendage closure device and prosthetic tricuspid/mitral valves noted. No acute osseous abnormalities. The soft tissues are within normal limits. Lower cervical spinal fixation hardware noted. IMPRESSION: *No active cardiopulmonary disease. Electronically Signed   By: Jules Schick M.D.   On: 06/16/2023 08:20   DG Chest Port 1 View Result Date: 06/12/2023 CLINICAL DATA:  PICC EXAM: PORTABLE CHEST 1 VIEW COMPARISON:  X-ray 06/12/2023 earlier FINDINGS: Status post median sternotomy. Stable enlarged cardiopericardial silhouette with atrial occlusion clip and prosthetic aortic and mitral valves. No pneumothorax, effusion or edema. Overlapping cardiac leads. Fixation hardware along the lower cervical spine at the edge of the imaging field. Left upper chest pacemaker. Placement of a right-sided PICC with tip along the central SVC. IMPRESSION: Interval placement of a right-sided PICC with tip overlying the central SVC Electronically Signed   By: Karen Kays M.D.   On: 06/12/2023 16:04   DG CHEST PORT 1 VIEW Result Date: 06/12/2023 CLINICAL DATA:   Pleural effusion. EXAM: PORTABLE CHEST 1 VIEW COMPARISON:  One-view chest x-ray 06/06/2023. FINDINGS: The heart is enlarged. Pacemaker lead is stable. Atrial clip and valve repairs are noted. No edema or effusion is present. Lung volumes are low. Cervical fusion is noted. IMPRESSION: 1. Cardiomegaly without failure. 2. Low lung volumes. Electronically Signed   By: Marin Roberts M.D.   On: 06/12/2023 10:01   [place] EKG SITE RITE Result Date: 06/12/2023 If Site Rite image not attached, placement could not be confirmed due to current cardiac rhythm.  CT ABDOMEN PELVIS WO CONTRAST Result Date: 06/11/2023 CLINICAL DATA:  Abdominal pain, acute, nonlocalized. Leukocytosis. Rectal bleeding. EXAM: CT ABDOMEN AND PELVIS WITHOUT CONTRAST TECHNIQUE: Multidetector CT imaging of the abdomen and pelvis was performed following the standard protocol without IV contrast. RADIATION DOSE REDUCTION: This exam was performed according to the departmental dose-optimization program which includes automated exposure control, adjustment of the mA and/or kV according to patient size and/or use of iterative reconstruction technique. COMPARISON:  Two-view abdomen 06/07/2023. CT of the abdomen pelvis as 12/01/2020 FINDINGS: Lower chest: Is mild dependent atelectasis is present both lungs. May heart is enlarged. Pacing wires are in place. No significant pleural or pericardial effusion is present. Hepatobiliary: Liver is unremarkable. Layering stones are present in the gallbladder. Layering sludge also noted present. No inflammatory changes are present. The common bile duct is within normal limits. Pancreas: Unremarkable. No pancreatic ductal dilatation or surrounding inflammatory changes. Spleen: Normal in size without focal abnormality. Adrenals/Urinary Tract: The right adrenal gland is within normal limits. A 13 mm fat density lesion is stable in the left adrenal gland. A 5 mm fat containing lesion is present at the upper pole of the left  kidney. A 1.5 cm stone or complex of stones is present at the upper pole of the left kidney. No other  stone or mass lesion is present. No obstruction is present. The ureters are within normal limits bilaterally. A Foley catheter is present within the urinary bladder. Gas is present within the urinary bladder. Stomach/Bowel: The stomach and duodenum are within normal limits. Small bowel is unremarkable. Is the terminal ileum is within normal limits. Appendix not discretely visualized. It may be surgically absent. No focal inflammatory changes are present in the region. Vascular/Lymphatic: Atherosclerotic calcifications are present in the aorta and branch vessels. No aneurysm is present. A right femoral venous line is in place. No significant adenopathy is present. Reproductive: Prostate is unremarkable. Other: No abdominal wall hernia or abnormality. No abdominopelvic ascites. Musculoskeletal: The vertebral body heights and alignment are normal. No focal osseous lesions are present. Fused anterior osteophytes extend from the thoracic spine to the L2 level. Fused anterior osteophytes are present at L2-3 and L4-S1. Median sternotomy is noted. Bony pelvis is within normal limits. The SI joints are fused bilaterally. Mild degenerative changes are present in the hips bilaterally. No acute focal osseous abnormality is present otherwise. IMPRESSION: 1. No acute or focal lesion to explain the patient's symptoms. 2. Cholelithiasis without secondary signs of cholecystitis. 3. 1.5 cm stone or complex of stones at the upper pole of the left kidney without obstruction. 4. Stable 13 mm fat density lesion in the left adrenal gland compatible with adrenal myelolipoma. 5. 5 mm fat containing lesion at the upper pole of the left kidney compatible with angiomyolipoma. 6. Cardiomegaly without failure. Electronically Signed   By: Marin Roberts M.D.   On: 06/11/2023 15:30   DG Abd 2 Views Result Date: 06/07/2023 CLINICAL DATA:   Follow-up abdominal distension. EXAM: ABDOMEN - 2 VIEW COMPARISON:  06/06/2023 FINDINGS: Persistent diffuse gaseous distension of the colon. The colon measures 7.9 cm at the level of the hepatic flexure. Previously 8.2 cm. Gas seen up to the level of the rectum. Gallstones noted in the right upper quadrant of the abdomen. No signs of pneumoperitoneum. IMPRESSION: 1. Persistent diffuse gaseous distension of the colon.  Favor ileus. 2. Cholelithiasis. Electronically Signed   By: Signa Kell M.D.   On: 06/07/2023 08:52   DG ABD ACUTE 2+V W 1V CHEST Result Date: 06/06/2023 CLINICAL DATA:  Ileus. EXAM: DG ABDOMEN ACUTE WITH 1 VIEW CHEST COMPARISON:  Chest radiographs 06/05/2023 and 06/04/2023. Chest CT 04/23/2023. Abdominal radiographs 04/07/2023. FINDINGS: Stable cardiomegaly post median sternotomy and mitral valve repair. Left subclavian pacemaker lead projects over the right atrium. The lungs are clear. There is no pleural effusion or pneumothorax. Nonspecific new diffuse moderate colonic distension with scattered air-fluid levels on the erect examination. Gas extends into the rectum. No evidence of pneumoperitoneum. Gallstones, scattered vascular calcifications and lumbar spine degenerative changes are noted. IMPRESSION: 1. Nonspecific new diffuse moderate colonic distension with scattered air-fluid levels, likely reflecting ileus. If the patient has obstructive symptoms, consider further evaluation with CT. 2. No evidence of pneumoperitoneum. 3. No evidence of acute cardiopulmonary process. Electronically Signed   By: Carey Bullocks M.D.   On: 06/06/2023 14:02   DG CHEST PORT 1 VIEW Result Date: 06/05/2023 CLINICAL DATA:  [id number] Respiratory failure (HCC) [id number] EXAM: PORTABLE CHEST 1 VIEW COMPARISON:  06/04/2023 FINDINGS: Left-sided implanted cardiac device remains in place. Stable cardiomegaly. Prior postsurgical changes to the chest. No focal airspace consolidation, pleural effusion, or pneumothorax.  IMPRESSION: No active disease. Electronically Signed   By: Duanne Guess D.O.   On: 06/05/2023 12:16   DG CHEST PORT 1 VIEW Result Date:  06/04/2023 CLINICAL DATA:  Pacemaker. EXAM: PORTABLE CHEST 1 VIEW COMPARISON:  June 03, 2023. FINDINGS: Stable cardiomegaly. Single lead left-sided pacemaker is unchanged. No pneumothorax. Status post cardiac valve repair. Both lungs are clear. The visualized skeletal structures are unremarkable. IMPRESSION: No active disease. Electronically Signed   By: Lupita Raider M.D.   On: 06/04/2023 11:22   DG Chest Port 1 View Result Date: 06/03/2023 CLINICAL DATA:  Pacemaker EXAM: PORTABLE CHEST 1 VIEW COMPARISON:  06/02/2023 FINDINGS: Interval placement of left pacemaker. Single lead curls in the right atrium, possibly atrial appendage. No pneumothorax. Cardiomegaly. Continued left lower lobe atelectasis or infiltrate with small left effusion. Right lung clear. IMPRESSION: Interval placement of left pacer with single lead tip in the right atrium. No pneumothorax. Small left effusion with left lower lobe atelectasis or infiltrate, unchanged. Electronically Signed   By: Charlett Nose M.D.   On: 06/03/2023 20:31   EP STUDY Result Date: 06/03/2023 See surgical note for result.  ECHO TEE Result Date: 06/03/2023    TRANSESOPHOGEAL ECHO REPORT   Patient Name:   TILFORD DEATON Date of Exam: 06/03/2023 Medical Rec #:  [id number]      Height:       70.0 in Accession #:    [id number]     Weight:       199.5 lb Date of Birth:  05-21-1945       BSA:          2.085 m Patient Age:    78 years       BP:           95/67 mmHg Patient Gender: M              HR:           101 bpm. Exam Location:  Inpatient Procedure: 3D Echo, Transesophageal Echo, Cardiac Doppler and Color Doppler            (Both Spectral and Color Flow Doppler were utilized during            procedure). Indications:     I48.91* Unspeicified atrial fibrillation  History:         Patient has prior history of Echocardiogram  examinations, most                  recent 05/30/2023. Abnormal ECG, Mitral Valve Disease and                  Tricuspid valve repair, Arrythmias:Atrial Fibrillation and                  Bradycardia; Risk Factors:Dyslipidemia and Hypertension.                   Mitral Valve: prosthetic annuloplasty ring valve is present in                  the mitral position.  Sonographer:     Sheralyn Boatman RDCS Referring Phys:  [id number] BRIDGETTE CHRISTOPHER Diagnosing Phys: Jodelle Red MD PROCEDURE: After discussion of the risks and benefits of a TEE, an informed consent was obtained from the patient. The transesophogeal probe was passed without difficulty through the esophogus of the patient. Imaged were obtained with the patient in a left lateral decubitus position. Sedation performed by different physician. The patient was monitored while under deep sedation. Anesthestetic sedation was provided intravenously by Anesthesiology: 272.55mg  of Propofol, 40mg  of Lidocaine. The patient's vital signs; including heart rate, blood pressure, and  oxygen saturation; remained stable throughout the procedure. The patient developed no complications during the procedure. A successful direct current cardioversion was performed at 200 joules with 1 attempt.  IMPRESSIONS  1. Left ventricular ejection fraction, by estimation, is 35 to 40%. The left ventricle has moderately decreased function. The left ventricular internal cavity size was mildly to moderately dilated.  2. Right ventricular systolic function is moderately reduced. The right ventricular size is mildly enlarged. There is mildly elevated pulmonary artery systolic pressure. The estimated right ventricular systolic pressure is 38.9 mmHg.  3. S/P LA appendage atriclip. Left atrial size was moderately dilated. No left atrial/left atrial appendage thrombus was detected.  4. Right atrial size was moderately dilated.  5. A small pericardial effusion is present. The pericardial effusion is  circumferential.  6. The mitral valve has been repaired/replaced. Trivial mitral valve regurgitation. No evidence of mitral stenosis. There is a prosthetic annuloplasty ring present in the mitral position. Echo findings are consistent with normal structure and function of the mitral valve prosthesis.  7. The tricuspid valve is has been repaired/replaced. The tricuspid valve is status post repair with an annuloplasty ring. Tricuspid valve regurgitation is mild to moderate.  8. The aortic valve is tricuspid. There is mild calcification of the aortic valve. Aortic valve regurgitation is not visualized. Aortic valve sclerosis is present, with no evidence of aortic valve stenosis.  9. Pulmonic valve regurgitation is moderate. 10. Aortic dilatation noted. There is mild dilatation of the ascending aorta, measuring 43 mm. There is mild (Grade II) plaque involving the descending aorta. 11. Cannot exclude a small PFO. Agitated saline contrast bubble study was negative, with no evidence of any interatrial shunt. 12. Mitral, Tricuspid, and LAA and demonstrates MVR/TVR stable, LAA appears clipped. Conclusion(s)/Recommendation(s): No LA/LAA thrombus identified. Successful cardioversion performed with restoration of normal sinus rhythm. FINDINGS  Left Ventricle: Left ventricular ejection fraction, by estimation, is 35 to 40%. The left ventricle has moderately decreased function. The left ventricular internal cavity size was mildly to moderately dilated. Right Ventricle: The right ventricular size is mildly enlarged. No increase in right ventricular wall thickness. Right ventricular systolic function is moderately reduced. There is mildly elevated pulmonary artery systolic pressure. The tricuspid regurgitant velocity is 2.78 m/s, and with an assumed right atrial pressure of 8 mmHg, the estimated right ventricular systolic pressure is 38.9 mmHg. Left Atrium: S/P LA appendage atriclip. Left atrial size was moderately dilated. No left  atrial/left atrial appendage thrombus was detected. Right Atrium: Right atrial size was moderately dilated. Pericardium: A small pericardial effusion is present. The pericardial effusion is circumferential. Mitral Valve: The mitral valve has been repaired/replaced. Trivial mitral valve regurgitation. There is a prosthetic annuloplasty ring present in the mitral position. Echo findings are consistent with normal structure and function of the mitral valve prosthesis. No evidence of mitral valve stenosis. There is no evidence of mitral valve vegetation. Tricuspid Valve: The tricuspid valve is has been repaired/replaced. Tricuspid valve regurgitation is mild to moderate. No evidence of tricuspid stenosis. The tricuspid valve is status post repair with an annuloplasty ring. There is no evidence of tricuspid valve vegetation. Aortic Valve: The aortic valve is tricuspid. There is mild calcification of the aortic valve. Aortic valve regurgitation is not visualized. Aortic valve sclerosis is present, with no evidence of aortic valve stenosis. There is no evidence of aortic valve  vegetation. Pulmonic Valve: The pulmonic valve was grossly normal. Pulmonic valve regurgitation is moderate. Aorta: Aortic dilatation noted. There is mild dilatation of  the ascending aorta, measuring 43 mm. There is mild (Grade II) plaque involving the descending aorta. IAS/Shunts: Cannot exclude a small PFO. Agitated saline contrast was given intravenously to evaluate for intracardiac shunting. Agitated saline contrast bubble study was negative, with no evidence of any interatrial shunt. Additional Comments: Spectral Doppler performed.  AORTA Ao Asc diam: 4.30 cm TRICUSPID VALVE TR Peak grad:   30.9 mmHg TR Vmax:        278.00 cm/s Jodelle Red MD Electronically signed by Jodelle Red MD Signature Date/Time: 06/03/2023/4:20:20 PM    Final    EP PPM/ICD IMPLANT Result Date: 06/03/2023 CONCLUSIONS:  1. Successful implantation of a  [employer] single chamber pacemaker for symptomatic bradycardia due to sinus node dysfunction  2. No early apparent complications.       Lewayne Bunting, MD 06/03/2023 4:07 PM   DG CHEST PORT 1 VIEW Result Date: 06/02/2023 CLINICAL DATA:  Follow up pleural effusion. EXAM: PORTABLE CHEST 1 VIEW COMPARISON:  Radiographs 05/31/2023 and 05/29/2023.  CT 04/23/2023. FINDINGS: 0519 hours. The heart size and mediastinal contours are stable status post recent median sternotomy, dual valve replacement and left atrial appendage clipping. No evidence of pneumothorax. Interval improved aeration of both lung bases with probable small residual bilateral pleural effusions. The bones appear unchanged. IMPRESSION: Interval improved aeration of both lung bases with probable small residual bilateral pleural effusions. No evidence of pneumothorax. Electronically Signed   By: Carey Bullocks M.D.   On: 06/02/2023 09:30   DG CHEST PORT 1 VIEW Result Date: 05/31/2023 CLINICAL DATA:  Status post mitral valve repair. EXAM: PORTABLE CHEST 1 VIEW COMPARISON:  05/29/2023 FINDINGS: Chest tube has been removed. Right jugular central line is still present. Again noted are postoperative changes in the heart with median sternotomy wires. Surgical plate in lower cervical spine. Negative for pneumothorax. Persistent densities in the lower chest bilaterally that could represent atelectasis and/or pleural fluid. Trachea remains midline. IMPRESSION: 1. Chest tube has been removed. Negative for pneumothorax. 2. Persistent densities in the lower chest bilaterally. Electronically Signed   By: Richarda Overlie M.D.   On: 05/31/2023 10:05   ECHOCARDIOGRAM LIMITED Result Date: 05/30/2023    ECHOCARDIOGRAM LIMITED REPORT   Patient Name:   Aulton Routt Buser Date of Exam: 05/30/2023 Medical Rec #:  [id number]      Height:       70.0 in Accession #:    [id number]     Weight:       216.9 lb Date of Birth:  [birth year]       BSA:          2.161 m Patient Age:    78 years        BP:           116/68 mmHg Patient Gender: M              HR:           70 bpm. Exam Location:  Inpatient Procedure: Transesophageal Echo (Both Spectral and Color Flow Doppler were            utilized during procedure). Indications:    EF  History:        Patient has prior history of Echocardiogram examinations, most                 recent 02/07/2023.  Sonographer:    Harriette Bouillon RDCS Referring Phys: Jeanella Craze IMPRESSIONS  1. Left ventricular ejection fraction, by estimation, is 30 to 35%.  The left ventricle has moderately decreased function. The left ventricle demonstrates global hypokinesis. There is mild concentric left ventricular hypertrophy.  2. Right ventricular systolic function is moderately reduced. The right ventricular size is moderately enlarged.  3. Left atrial size was moderately dilated.  4. Right atrial size was moderately dilated.  5. The mitral valve has been repaired/replaced.  6. The inferior vena cava is dilated in size with <50% respiratory variability, suggesting right atrial pressure of 15 mmHg. Conclusion(s)/Recommendation(s): Limited echo to reassess LV and RV function. There has been a mitral valve repair. Valve not interrogated with Doppler on this study. FINDINGS  Left Ventricle: Left ventricular ejection fraction, by estimation, is 30 to 35%. The left ventricle has moderately decreased function. The left ventricle demonstrates global hypokinesis. There is mild concentric left ventricular hypertrophy. Abnormal (paradoxical) septal motion, consistent with RV pacemaker. Right Ventricle: The right ventricular size is moderately enlarged. Right ventricular systolic function is moderately reduced. Left Atrium: Left atrial size was moderately dilated. Right Atrium: Right atrial size was moderately dilated. Pericardium: There is no evidence of pericardial effusion. Mitral Valve: The mitral valve has been repaired/replaced. There is a prosthetic annuloplasty ring present in the mitral  position. Venous: The inferior vena cava is dilated in size with less than 50% respiratory variability, suggesting right atrial pressure of 15 mmHg. LEFT VENTRICLE PLAX 2D LVIDd:         5.50 cm LVIDs:         3.70 cm LV PW:         1.30 cm LV IVS:        1.30 cm  RIGHT ATRIUM           Index RA Area:     22.10 cm RA Volume:   69.20 ml  32.03 ml/m Arvilla Meres MD Electronically signed by Arvilla Meres MD Signature Date/Time: 05/30/2023/2:55:56 PM    Final    DG Chest Port 1 View Result Date: 05/29/2023 CLINICAL DATA:  Mitral valve replacement. EXAM: PORTABLE CHEST 1 VIEW COMPARISON:  05/28/2023 FINDINGS: Low volume film. The cardio pericardial silhouette is enlarged. Bibasilar atelectasis with small bilateral pleural effusions, left greater than right. Interval removal of pulmonary artery catheter with right IJ sheath still in place. Midline mediastinal/pericardial drains again noted. IMPRESSION: Low volume film with bibasilar atelectasis and small bilateral pleural effusions, left greater than right. Electronically Signed   By: Kennith Center M.D.   On: 05/29/2023 08:24   ECHO INTRAOPERATIVE TEE Result Date: 05/28/2023  *INTRAOPERATIVE TRANSESOPHAGEAL REPORT *  Patient Name:   MANPREET STREY Date of Exam: 05/27/2023 Medical Rec #:  [id number]      Height:       70.0 in Accession #:    [id number]     Weight:       205.0 lb Date of Birth:  02-11-1946       BSA:          2.11 m Patient Age:    78 years       BP:           153/93 mmHg Patient Gender: M              HR:           84 bpm. Exam Location:  Inpatient Transesophogeal exam was perform intraoperatively during surgical procedure. Patient was closely monitored under general anesthesia during the entirety of examination. Indications:     Mitral and tricuspid valve abnormaility Performing Phys: [id number] Eugenio Hoes  Diagnosing Phys: Earl Lites Stoltzfus Complications: No known complications during this procedure. POST-OP IMPRESSIONS _ Left Ventricle: The  left ventricle is unchanged from pre-bypass. _ Right Ventricle: The right ventricle appears unchanged from pre-bypass. _ Aorta: The aorta appears unchanged from pre-bypass. _ Left Atrial Appendage: A is completely occluded device was placed. _ Aortic Valve: The aortic valve appears unchanged from pre-bypass. There is mild regurgitation. _ Mitral Valve: The gradient recorded across the prosthetic valve is within the expected range. The mitral valve is status post repair with an annuloplasty ring. _ Tricuspid Valve: The gradient recorded across the prosthetic valve is within the expected range. No perivalvular leak noted. _ Pulmonic Valve: The pulmonic valve appears unchanged from pre-bypass. _ Interatrial Septum: The interatrial septum appears unchanged from pre-bypass. _ Pericardium: The pericardium appears unchanged from pre-bypass. _ Comments: S/P MV repair with 32mm Simulus semi rigid band with P2 triangular resection, TV repair with a 34mm MC3 annuloplasty ring, MAZE for AF, LAA closure with 50mm Medtronic clip. Mildly worseing central AI post procedure from baseline. Both rings seated appropriately without leak or rock. Gradients WNL. EF 40-45% post procedure, emergence from CPB requiring epinephrine and phenylephrine support. PRE-OP FINDINGS  Left Ventricle: The left ventricle has mildly reduced systolic function, with an ejection fraction of 45-50%. The cavity size was normal. Left ventricular diastolic function could not be evaluated due to atrial fibrillation. Left ventricular diastolic function could not be evaluated. Right Ventricle: The right ventricle has normal systolic function. The cavity was dialated. There is no increase in right ventricular wall thickness. Right ventricular systolic pressure is mildly elevated. Left Atrium: Left atrial size was dilated. No left atrial/left atrial appendage thrombus was detected. There is echo contrast seen in the left atrial cavity. Left atrial appendage velocity  is normal at greater than 40 cm/s. Right Atrium: Right atrial size was dilated. Interatrial Septum: No atrial level shunt detected by color flow Doppler. There is no evidence of a patent foramen ovale. Pericardium: There is no evidence of pericardial effusion. There is no pleural effusion. Mitral Valve: The mitral valve is myxomatous. Mitral valve regurgitation moderate to severe. The MR jet is anteriorly-directed. Pulmonary venous flow shows systolic flow reversal. Moderate prolapse of the mitral valve P2 cusp was present. PISA EROA .35cm2, RV 48cc, VC .63cm. Tricuspid Valve: The tricuspid valve was normal in structure. Tricuspid valve regurgitation is severe by color flow Doppler. The jet is directed toward the atrial septum. No evidence of tricuspid stenosis is present. Severely dilated annulus (5.05cm), S wave reversal hepatic veins. Aortic Valve: The aortic valve is tricuspid Aortic valve regurgitation is trivial by color flow Doppler. The jet is centrally-directed. There is no stenosis of the aortic valve, with a calculated valve area of 2.54 cm. There is no evidence of aortic valve vegetation. Pulmonic Valve: The pulmonic valve was normal in structure, with normal. No evidence of pumonic stenosis. Pulmonic valve regurgitation is trivial by color flow Doppler. Aorta: The ascending aorta and aortic root are normal in size and structure. Pulmonary Artery: Theone Murdoch catheter present on the right. The pulmonary artery is of normal size. Venous: The inferior vena cava measures 2.50 cm, is dilated in size with less than 50% respiratory variability, suggesting right atrial pressure of 15 mmHg. Shunts: There is no evidence of an atrial septal defect. +--------------+--------++ LEFT VENTRICLE         +--------------+--------++ PLAX 2D                +--------------+--------++ LVOT  diam:    2.30 cm  +--------------+--------++ LVOT Area:    4.15 cm +--------------+--------++                         +--------------+--------++ +---------------+---------++ RIGHT VENTRICLE          +---------------+---------++ RVSP:          41.4 mmHg +---------------+---------++ +------------+----------+++ RIGHT ATRIUM           +------------+----------+++ RA Pressure:15.00 mmHg +------------+----------+++  +------------------+-----------++ AORTIC VALVE                  +------------------+-----------++ AV Area (Vmax):   2.60 cm    +------------------+-----------++ AV Area (Vmean):  2.29 cm    +------------------+-----------++ AV Area (VTI):    2.54 cm    +------------------+-----------++ AV Vmax:          85.60 cm/s  +------------------+-----------++ AV Vmean:         60.800 cm/s +------------------+-----------++ AV VTI:           0.165 m     +------------------+-----------++ AV Peak Grad:     2.9 mmHg    +------------------+-----------++ AV Mean Grad:     2.0 mmHg    +------------------+-----------++ LVOT Vmax:        53.50 cm/s  +------------------+-----------++ LVOT Vmean:       33.500 cm/s +------------------+-----------++ LVOT VTI:         0.101 m     +------------------+-----------++ LVOT/AV VTI ratio:0.61        +------------------+-----------++  +--------------+-------++ AORTA                 +--------------+-------++ Ao Sinus diam:4.20 cm +--------------+-------++ Ao STJ diam:  3.6 cm  +--------------+-------++ Ao Asc diam:  4.50 cm +--------------+-------++ +-------------+---------++       +---------------+-----------++ MITRAL VALVE                 TRICUSPID VALVE            +-------------+---------++       +---------------+-----------++ MV Peak grad:3.2 mmHg        TR Peak grad:  26.4 mmHg   +-------------+---------++       +---------------+-----------++ MV Mean grad:1.0 mmHg        TR Mean grad:  17.0 mmHg   +-------------+---------++       +---------------+-----------++ MV Vmax:     0.90 m/s         TR Vmax:       257.00 cm/s +-------------+---------++       +---------------+-----------++ MV Vmean:    48.1 cm/s       TR Vmean:      191.0 cm/s  +-------------+---------++       +---------------+-----------++ MV VTI:      0.12 m          Estimated RAP: 15.00 mmHg  +-------------+---------++       +---------------+-----------++ +----------------+-----------++  RVSP:          41.4 mmHg   MR Peak grad:   66.6 mmHg    +---------------+-----------++ +----------------+-----------++ MR Mean grad:   46.0 mmHg    +--------------+-------+ +----------------+-----------++  SHUNTS                MR Vmax:        408.00 cm/s  +--------------+-------+ +----------------+-----------++  Systemic VTI: 0.10 m  MR Vmean:       316.0 cm/s   +--------------+-------+ +----------------+-----------++  Systemic Diam:2.30 cm MR PISA:  4.02 cm     +--------------+-------+ +----------------+-----------++ MR PISA Eff ROA:35 mm      +----------------+-----------++ MR PISA Radius: 0.80 cm     +----------------+-----------++ +---------+-------+ IVC              +---------+-------+ IVC diam:2.50 cm +---------+-------+  Hester Mates Electronically signed by Hester Mates Signature Date/Time: 05/28/2023/5:30:13 PM    Final    DG Chest Port 1 View Result Date: 05/28/2023 CLINICAL DATA:  Mitral valve repair, tricuspid valve repair EXAM: PORTABLE CHEST - 1 VIEW COMPARISON:  05/27/2023 FINDINGS: Patient has been extubated and the gastric tube removed. Right IJ Swan-Ganz loops near the pulmonary artery bifurcation. Mediastinal drain remains in place. Bibasilar opacities left greater than right, partially improved. Heart size and mediastinal contours are within normal limits. No definite effusion. Sternotomy wires. IMPRESSION: 1. Interval extubation. 2. Improving bibasilar opacities. Electronically Signed   By: Corlis Leak M.D.   On: 05/28/2023 10:18   DG Chest Port 1  View Result Date: 05/27/2023 CLINICAL DATA:  Status post mitral valve repair. EXAM: PORTABLE CHEST 1 VIEW COMPARISON:  Chest radiograph dated 05/23/2023. FINDINGS: Endotracheal tube approximately 4.5 cm above the carina. Enteric tube extends below the diaphragm with tip beyond the inferior margin of the image. Right IJ Swan-Ganz catheter with tip projecting over the left hilum, likely in the right pulmonary artery. Bilateral streaky atelectasis. Consolidative changes of the left lung base may represent atelectasis. Pneumonia is not excluded no pneumothorax. Stable cardiomegaly. Cardiac valve repair. No acute osseous pathology. IMPRESSION: 1. Support apparatus as above. 2. Bilateral atelectasis. Electronically Signed   By: Elgie Collard M.D.   On: 05/27/2023 15:41   DG Chest 2 View Result Date: 05/26/2023 CLINICAL DATA:  Preop chest exam. Nonrheumatic mitral valve regurgitation. Tricuspid valve insufficiency. Upcoming mitral valve repair. EXAM: CHEST - 2 VIEW COMPARISON:  Radiograph 06/26/2007, CT 04/23/2023 FINDINGS: The heart is enlarged. Mild aortic tortuosity. No focal airspace disease, pleural effusion, or pneumothorax. Normal pulmonary vasculature. Mild thoracic spondylosis. Lower cervical spine hardware partially included in the field of view. IMPRESSION: Unchanged cardiomegaly.  No acute findings. Electronically Signed   By: Narda Rutherford M.D.   On: 05/26/2023 22:33   VAS US CAROTID Result Date: 05/23/2023 Carotid Arterial Duplex Study Patient Name:  KYNDALL CHAPLIN  Date of Exam:   05/23/2023 Medical Rec #: [id number]       Accession #:    [id number] Date of Birth: 10-31-1945        Patient Gender: M Patient Age:   80 years Exam Location:  [facility] Procedure:      VAS US CAROTID Referring Phys: Eugenio Hoes --------------------------------------------------------------------------------  Indications:       Severe mitral regurgitation [366440], Tricuspid regurgitation                     [206723], A-fib (HCC) [347425]. Risk Factors:      Hypertension. Comparison Study:  No prior studies. Performing Technologist: Chanda Busing RVT  Examination Guidelines: A complete evaluation includes B-mode imaging, spectral Doppler, color Doppler, and power Doppler as needed of all accessible portions of each vessel. Bilateral testing is considered an integral part of a complete examination. Limited examinations for reoccurring indications may be performed as noted.  Right Carotid Findings: +----------+--------+--------+--------+-----------------------+--------+           PSV cm/sEDV cm/sStenosisPlaque Description     Comments +----------+--------+--------+--------+-----------------------+--------+ CCA Prox  71      15  smooth and heterogenous         +----------+--------+--------+--------+-----------------------+--------+ CCA Distal74      22              smooth and heterogenous         +----------+--------+--------+--------+-----------------------+--------+ ICA Prox  77      19              smooth and heterogenous         +----------+--------+--------+--------+-----------------------+--------+ ICA Mid   36      19                                              +----------+--------+--------+--------+-----------------------+--------+ ICA Distal71      36                                     tortuous +----------+--------+--------+--------+-----------------------+--------+ ECA       72      20                                              +----------+--------+--------+--------+-----------------------+--------+ +----------+--------+-------+--------+-------------------+           PSV cm/sEDV cmsDescribeArm Pressure (mmHG) +----------+--------+-------+--------+-------------------+ [id number]                                         +----------+--------+-------+--------+-------------------+ +---------+--------+--+--------+--+---------+  VertebralPSV cm/s34EDV cm/s11Antegrade +---------+--------+--+--------+--+---------+  Left Carotid Findings: +----------+--------+--------+--------+-----------------------+--------+           PSV cm/sEDV cm/sStenosisPlaque Description     Comments +----------+--------+--------+--------+-----------------------+--------+ CCA Prox  91      17              smooth and heterogenous         +----------+--------+--------+--------+-----------------------+--------+ CCA Distal57      18              smooth and heterogenous         +----------+--------+--------+--------+-----------------------+--------+ ICA Prox  52      12                                              +----------+--------+--------+--------+-----------------------+--------+ ICA Mid   44      21                                     tortuous +----------+--------+--------+--------+-----------------------+--------+ ICA Distal50      15                                     tortuous +----------+--------+--------+--------+-----------------------+--------+ ECA       73      12                                              +----------+--------+--------+--------+-----------------------+--------+ +----------+--------+--------+--------+-------------------+  PSV cm/sEDV cm/sDescribeArm Pressure (mmHG) +----------+--------+--------+--------+-------------------+ [id number]                                          +----------+--------+--------+--------+-------------------+ +---------+--------+--+--------+--+---------+ VertebralPSV cm/s36EDV cm/s15Antegrade +---------+--------+--+--------+--+---------+   Summary: Right Carotid: Velocities in the right ICA are consistent with a 1-39% stenosis. Left Carotid: Velocities in the left ICA are consistent with a 1-39% stenosis. Vertebrals: Bilateral vertebral arteries demonstrate antegrade flow. *See table(s) above for measurements and observations.   Electronically signed by Carolynn Sayers on 05/23/2023 at 12:33:58 PM.    Final     Labs:  Basic Metabolic Panel: Recent Labs  Lab 06/12/23 0503 06/13/23 0515 06/13/23 1711 06/14/23 0225 06/15/23 0329 06/16/23 0322 06/17/23 0359  NA 131* 132* 134* 137 135 139 138  K 3.8 3.0* 3.1* 3.1* 3.4* 3.3* 4.0  CL 105 100 94* 101 102 97* 108  CO2 20* 21* 21* 23 22 19* 23  GLUCOSE 99 156* 209* 102* 240* 127* 134*  BUN 41* 26* 20 17 15 12 15   CREATININE 1.52* 1.23 1.38* 1.26* 1.13 1.25* 1.10  CALCIUM 7.1* 6.6* 6.8* 7.5* 7.5* 6.5* 7.3*  MG  --  2.2  --   --  2.0 1.7 2.2  PHOS  --   --   --   --  2.3* 2.4*  --     CBC: Recent Labs  Lab 06/16/23 0322 06/17/23 0359 06/18/23 0500  WBC 8.9 9.6 8.7  HGB 9.3* 10.5* 10.9*  HCT 28.5* 32.2* 33.7*  MCV 88.8 89.2 89.9  PLT 166 200 235    CBG: Recent Labs  Lab 06/15/23 0632 06/15/23 0752 06/15/23 1126 06/15/23 1626 06/16/23 0635  GLUCAP 134* 126* 99 93 73   Family history.  Mother with heart disease father with prostate cancer.  Denies any colon cancer esophageal cancer or rectal cancer  Brief HPI:   ROLLAN ROGER is a 78 y.o. right-handed male with history significant for hypertension hyperlipidemia chronic UTI, paroxysmal atrial fibrillation/MR/TR status post ablation 2012 maintained on Xarelto, prostate cancer with laser surgery/prostatectomy 2005, glaucoma.  Per chart review patient lives with spouse.  Reportedly independent prior to admission.  Presented 05/27/2023 with symptomatic severe MR/TR and atrial fibrillation.  Initially seen by cardiology services and decided against mitral valve clip therapy.  Underwent complex MV and TV repair with full left and right side maze procedure with radiofrequency 05/27/2023 per Dr. Leafy Ro followed by [facility], PPM placement 2/25.  Hospital course complicated by GI bleed, atrial fibrillation with RVR, hemorrhagic shock with hypotension.  Patient underwent colonoscopy on 3/8 per Dr. Ileene Patrick which  was suspicious for ischemic colitis as source of bleeding.  1 lesion was biopsied eventually need to be resected.  Hemoglobin hematocrit remained stable and gastroenterology services have signed off.  Patient had been cleared for Eliquis therapy.  Cardiac rate remained controlled and remained on amiodarone as well as Lanoxin.  Patient had bouts of leukocytosis workup unremarkable felt to be possibly reactive maintained initially on empiric Zosyn which is since been discontinued with latest WBC of 8900.  Therapy evaluations completed due to patient decreased functional mobility was admitted for a comprehensive rehab program.   Hospital Course: Nikolai Wilczak Jake was admitted to rehab 06/18/2023 for inpatient therapies to consist of PT, ST and OT at least three hours five days a week. Past admission physiatrist, therapy team and rehab RN have worked together  to provide customized collaborative inpatient rehab.  Pertaining to patient's debility after MVR/TVR/maze/GI bleed/hemorrhagic shock remained stable he was attending therapy working with energy conservation techniques.  Maintained on Eliquis therapy as well as low-dose aspirin no bleeding episodes.  Hospital course complicated by GI bleed status post colonoscopy 06/14/2023 showed diverticula on the right and left colon patchy inflammation multiple sessile polyps and linear ulcerations consistent with ischemic colitis, 1 large lesion distal transverse colon biopsy that would be addressed as an outpatient with biopsy pending.  He remained on Protonix twice daily.  Hemoglobin hematocrit remained stable.  Atrial fibrillation/DDCV with pacemaker 2/25 cardiac rate controlled amiodarone that was tapered as advised and Lanoxin as advised followed by cardiology services.   Blood pressures were monitored on TID basis and stable and controlled     Rehab course: During patient's stay in rehab weekly team conferences were held to monitor patient's progress, set goals and  discuss barriers to discharge. At admission, patient required min mod assist mobility  Physical exam.  Blood pressure 109/73 pulse 85 temperature 97.7 respirations 19 oxygen saturation 97% room air Constitutional.  No acute distress HEENT Head.  Normocephalic and atraumatic Eyes.  Pupils round and reactive to light no discharge without nystagmus Neck.  Supple nontender no JVD without thyromegaly Cardiac regular rate and rhythm without any extra sounds or murmur heard Abdomen.  Soft nontender positive bowel sounds without rebound Respiratory effort normal no respiratory distress without wheeze Skin.  Clean dry and intact Neurologic.  Alert oriented x 3 following commands  He/She  has had improvement in activity tolerance, balance, postural control as well as ability to compensate for deficits. He/She has had improvement in functional use RUE/LUE  and RLE/LLE as well as improvement in awareness.Ambulates to bathroom supervision.Toilet with supervision and bed mobility.  Gathered belongings for activities of day living homemaking.  Full family teaching completed plan discharge to home       Disposition:  There are no questions and answers to display.         Diet: Regular  Special Instructions: No driving smoking or alcohol  Routine sternal precautions  Medications at discharge. 1.  Tylenol as needed 2.  Amiodarone 200 mg daily 3.  Eliquis 2.5 mg p.o. twice daily 4.  Aspirin 81 mg p.o. daily 5.  Lipitor 20 mg p.o. daily 6.  Lanoxin 0.125 mg p.o. daily 7.Fe Fum-Vit C-Vit B12-FA 1 capsule daily 8.  Protonix 40 mg p.o. twice daily 9.  Vitamin D 50,000 units every 7 days   30-35 minutes were spent completing discharge summary and discharge planning     Follow-up Information     Lovorn, Aundra Millet, MD Follow up.   Specialty: Physical Medicine and Rehabilitation Why: No formal follow-up needed Contact information: [address] [place]  [address] [phone]         Eugenio Hoes, MD Follow up.   Specialty: Cardiothoracic Surgery Why: Call for appointment Contact information: [address] [place] [address] [phone]         [name], MD Follow up.   Specialty: Cardiology Why: Call for appointment Contact information: [address] [place] [address] [phone]         Napoleon Form, MD Follow up.   Specialty: Gastroenterology Why: Call for appointment Contact information: [address] [employer] [address] [phone]  Signed: Mcarthur Rossetti Shuree Brossart 06/19/2023, 4:56 AM

## 2023-06-19 NOTE — Progress Notes (Signed)
 Physical Therapy Assessment and Plan  Patient Details  Name: [name] [name] MRN: [id number] Date of Birth: Jan 06, 1946  PT Diagnosis: Difficulty walking and Muscle weakness Rehab Potential: Excellent ELOS: 5-7 days   Today's Date: 06/19/2023 PT Individual Time: 1417-1536, 1417 - 1536 PT Individual Time Calculation (min): 79 min, 79 min    Hospital Problem: Principal Problem:   Debility   Past Medical History:  Past Medical History:  Diagnosis Date   Arthritis    Back   CVA (cerebral infarction) 07/14/2009   TIA   Dysrhythmia    A. Fib   Heart murmur    History of kidney stones    Hyperlipidemia    Hypertension    Nephrolithiasis    Paroxysmal atrial fibrillation (HCC)    Paroxysmal atrial flutter (HCC)    a. s/p ablation 2012.   Prostate cancer (HCC)    Stroke [facility])    TIA - no deficits   Past Surgical History:  Past Surgical History:  Procedure Laterality Date   APPENDECTOMY  1967   BONE BIOPSY  06/14/2023   Procedure: BIOPSY,Colon;  Surgeon: Benancio Deeds, MD;  Location: [facility] ENDOSCOPY;  Service: Gastroenterology;;   CARDIOVERSION N/A 06/03/2023   Procedure: CARDIOVERSION;  Surgeon: Jodelle Red, MD;  Location: [facility] INVASIVE CV LAB;  Service: Cardiovascular;  Laterality: N/A;   CATARACT EXTRACTION W/ INTRAOCULAR LENS IMPLANT Bilateral    CERVICAL SPINE SURGERY     Cadaver Bone Placed   COLONOSCOPY     COLONOSCOPY N/A 06/14/2023   Procedure: COLONOSCOPY;  Surgeon: Benancio Deeds, MD;  Location: [facility] ENDOSCOPY;  Service: Gastroenterology;  Laterality: N/A;   CYSTOSCOPY/URETEROSCOPY/HOLMIUM LASER/STENT PLACEMENT Left 09/19/2020   Procedure: CYSTOSCOPY RETROGRADE PYELOGRAM URETEROSCOPY/HOLMIUM LASER/STENT PLACEMENT;  Surgeon: Bjorn Pippin, MD;  Location: [facility] ORS;  Service: Urology;  Laterality: Left;   EYE SURGERY Bilateral    Eye Lid lifting   LUMBAR SPINE SURGERY     Ruptured Disc   MAZE N/A 05/27/2023   Procedure: MAZE;  Surgeon: Eugenio Hoes, MD;   Location: [facility] OR;  Service: Open Heart Surgery;  Laterality: N/A;   MITRAL VALVE REPAIR N/A 05/27/2023   Procedure: MITRAL VALVE REPAIR USING MEDTRONIC SIMULUS SEMI-RIDGID ANNULOPLASTY BAND (MVR);  Surgeon: Eugenio Hoes, MD;  Location: [facility] OR;  Service: Open Heart Surgery;  Laterality: N/A;   PACEMAKER IMPLANT N/A 06/03/2023   Procedure: PACEMAKER IMPLANT;  Surgeon: Marinus Maw, MD;  Location: [facility] INVASIVE CV LAB;  Service: Cardiovascular;  Laterality: N/A;   PROSTATECTOMY  2005   RIGHT/LEFT HEART CATH AND CORONARY ANGIOGRAPHY N/A 02/27/2023   Procedure: RIGHT/LEFT HEART CATH AND CORONARY ANGIOGRAPHY;  Surgeon: [name], MD;  Location: [facility] INVASIVE CV LAB;  Service: Cardiovascular;  Laterality: N/A;   TEE WITHOUT CARDIOVERSION N/A 02/07/2023   Procedure: TRANSESOPHAGEAL ECHOCARDIOGRAM;  Surgeon: Thurmon Fair, MD;  Location: [facility] INVASIVE CV LAB;  Service: Cardiovascular;  Laterality: N/A;   TEE WITHOUT CARDIOVERSION N/A 05/27/2023   Procedure: TRANSESOPHAGEAL ECHOCARDIOGRAM (TEE);  Surgeon: Eugenio Hoes, MD;  Location: [facility] OR;  Service: Open Heart Surgery;  Laterality: N/A;   TRANSESOPHAGEAL ECHOCARDIOGRAM (CATH LAB) N/A 06/03/2023   Procedure: TRANSESOPHAGEAL ECHOCARDIOGRAM;  Surgeon: Jodelle Red, MD;  Location: [facility] INVASIVE CV LAB;  Service: Cardiovascular;  Laterality: N/A;   TRICUSPID VALVE REPLACEMENT N/A 05/27/2023   Procedure: TRICUSPID VALVE REPAIR USING EDWARDS MC3 TRICUSPID ANNULOPLASTY RING;  Surgeon: Eugenio Hoes, MD;  Location: [facility] OR;  Service: Open Heart Surgery;  Laterality: N/A;    Assessment &  Plan Clinical Impression: Patient is a 78 year old right-handed male with history of hypertension, hyperlipidemia, chronic UTI, paroxysmal atrial fibrillation/MR/TR status post ablation 2012 maintained on Xarelto, prostate cancer with Holmium laser/prostatectomy 2005, TIA, glaucoma. Per chart review patient lives with spouse. Two-level home bed and bath main level  with 2 steps to entry. Reportedly independent prior to admission. Presented 05/27/2023 with symptomatic severe MR, TR and atrial fibrillation. Initially seen by cardiology services Dr.Thukkani and decided against a mitral valve clip therapy. Underwent complex MV and TV repair with full left and right side MAZE procedure with radiofrequency and cryo lesions 05/27/2023 per Dr. Leafy Ro followed by [facility], PPM placement 2/25. Hospital course complicated by GIB, A-fib with RVR, hemorrhagic shock with hypotension. Patient underwent colonoscopy on 3/8 per Dr. Ned Grace which was suspicious for ischemic colitis as source of bleeding. 1 lesion was biopsied and eventually need to be resected.Marland Kitchen Hemoglobin /hematocrit remained stable and gastroenterology services has signed off. Patient has been cleared to remain on Eliquis therapy. Cardiac rate remained controlled as he remains on amiodarone as well as Lanoxin. Patient has had bouts of leukocytosis workup unremarkable felt to be possibly reactive maintained on initial empiric Zosyn which has been since discontinued and latest WBC of 8900. Tolerating a regular consistency diet. Therapy evaluations completed working with energy conservation techniques. Due to patient's debility was admitted for a comprehensive rehab program.   Patient currently requires min with mobility secondary to muscle weakness, decreased cardiorespiratoy endurance, and decreased balance strategies and difficulty maintaining precautions.  Prior to hospitalization, patient was modified independent  with mobility and lived with Spouse in a House home.  Home access is 3Stairs to enter.  Patient will benefit from skilled PT intervention to maximize safe functional mobility, minimize fall risk, and decrease caregiver burden for planned discharge home with 24 hour supervision.  Anticipate patient will benefit from follow up OP at discharge.  PT - End of Session Activity Tolerance: Tolerates 30+ min  activity with multiple rests Endurance Deficit: Yes Endurance Deficit Description: decreased PT Assessment Rehab Potential (ACUTE/IP ONLY): Excellent PT Barriers to Discharge: Weight bearing restrictions PT Barriers to Discharge Comments: sternal and pacemaker precautions PT Patient demonstrates impairments in the following area(s): Balance;Endurance PT Transfers Functional Problem(s): Bed Mobility;Bed to Chair;Car;Furniture PT Locomotion Functional Problem(s): Ambulation;Stairs PT Plan PT Intensity: Minimum of 1-2 x/day ,45 to 90 minutes PT Frequency: 5 out of 7 days PT Duration Estimated Length of Stay: 5-7 days PT Treatment/Interventions: Ambulation/gait training;Community reintegration;Stair training;UE/LE Strength taining/ROM;Discharge planning;Pain management;Therapeutic Activities;Disease management/prevention;Functional mobility training;Patient/family education;Therapeutic Exercise PT Transfers Anticipated Outcome(s): mod(I) PT Locomotion Anticipated Outcome(s): mod(I) PT Recommendation Recommendations for Other Services: Other (comment) Follow Up Recommendations: Outpatient PT Patient destination: Home Equipment Recommended: To be determined Equipment Details: TBD   PT Evaluation Precautions/Restrictions Precautions Precautions: Sternal;Fall;ICD/Pacemaker Precaution Booklet Issued: Yes (comment) Recall of Precautions/Restrictions: Intact Precaution/Restrictions Comments: able to recall precautions Restrictions Other Position/Activity Restrictions: sternal precautions/ICD/pacemaker precautions General Chart Reviewed: Yes Vital Signs  Pain   Pain Interference Pain Interference Pain Effect on Sleep: 1. Rarely or not at all Pain Interference with Therapy Activities: 1. Rarely or not at all Pain Interference with Day-to-Day Activities: 1. Rarely or not at all Home Living/Prior Functioning Home Living Living Arrangements: Spouse/significant other Available Help at  Discharge: Family;Available 24 hours/day Type of Home: House Home Access: Stairs to enter [employer] of Steps: 3 Entrance Stairs-Rails: Left;Right Home Layout: Two level;Able to live on main level with bedroom/bathroom Bathroom Shower/Tub: Health visitor: Handicapped height Bathroom  Accessibility: Yes Additional Comments: has shower seat built in  Lives With: Spouse Prior Function Level of Independence: Independent with basic ADLs;Independent with gait;Independent with transfers  Able to Take Stairs?: Yes Driving: Yes Vocation: Full time employment 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" Leisure: Hobbies-yes (Comment) (golf, yardwork) Vision/Perception  Vision - History Ability to See in Adequate Light: 0 Adequate Perception Perception: Within Functional Limits Praxis Praxis: WFL  Cognition Overall Cognitive Status: Within Functional Limits for tasks assessed Arousal/Alertness: Awake/alert Orientation Level: Oriented X4 Sensation Sensation Light Touch: Appears Intact Coordination Gross Motor Movements are Fluid and Coordinated: Yes Fine Motor Movements are Fluid and Coordinated: Yes Motor  Motor Motor: Within Functional Limits;Other (comment) Motor - Skilled Clinical Observations: general weakness   Trunk/Postural Assessment  Cervical Assessment Cervical Assessment: Within Functional Limits Thoracic Assessment Thoracic Assessment: Within Functional Limits Lumbar Assessment Lumbar Assessment: Within Functional Limits Postural Control Postural Control: Within Functional Limits  Balance Balance Balance Assessed: Yes Static Sitting Balance Static Sitting - Balance Support: Feet supported;No upper extremity supported Static Sitting - Level of Assistance: 6: Modified independent (Device/Increase time) Static Sitting - Comment/# of Minutes: seated EOB Dynamic Sitting Balance Dynamic Sitting - Balance Support: Feet  supported Dynamic Sitting - Level of Assistance: 6: Modified independent (Device/Increase time) Dynamic Sitting - Balance Activities: Forward lean/weight shifting;Reaching for objects Sitting balance - Comments: seated EOB Static Standing Balance Static Standing - Balance Support: During functional activity Static Standing - Level of Assistance: 5: Stand by assistance Dynamic Standing Balance Dynamic Standing - Balance Support: During functional activity Dynamic Standing - Level of Assistance: 4: Min assist (CGA) Dynamic Standing - Balance Activities: Reaching for objects;Forward lean/weight shifting;Reaching across midline Extremity Assessment      RLE Assessment RLE Assessment: Exceptions to [facility] Passive Range of Motion (PROM) Comments: pain with full DF General Strength Comments: grossly 4/5 LLE Assessment LLE Assessment: Exceptions to [facility] Passive Range of Motion (PROM) Comments: pain free DF General Strength Comments: grossly 4/5  Care Tool Care Tool Bed Mobility Roll left and right activity   Roll left and right assist level: Supervision/Verbal cueing    Sit to lying activity   Sit to lying assist level: Contact Guard/Touching assist    Lying to sitting on side of bed activity   Lying to sitting on side of bed assist level: the ability to move from lying on the back to sitting on the side of the bed with no back support.: Minimal Assistance - Patient > 75%     Care Tool Transfers Sit to stand transfer   Sit to stand assist level: Contact Guard/Touching assist    Chair/bed transfer   Chair/bed transfer assist level: Contact Guard/Touching assist    Car transfer   Car transfer assist level: Contact Guard/Touching assist      Care Tool Locomotion Ambulation   Assist level: Contact Guard/Touching assist Assistive device: No Device Max distance: 665  Walk 10 feet activity   Assist level: Contact Guard/Touching assist Assistive device: No Device   Walk 50 feet  with 2 turns activity   Assist level: Contact Guard/Touching assist Assistive device: No Device  Walk 150 feet activity   Assist level: Contact Guard/Touching assist Assistive device: No Device  Walk 10 feet on uneven surfaces activity   Assist level: Contact Guard/Touching assist    Stairs   Assist level: Contact Guard/Touching assist Stairs assistive device: 1 hand rail Max number of stairs: 12  Walk up/down 1 step activity   Walk up/down 1 step (  curb) assist level: Contact Guard/Touching assist Walk up/down 1 step or curb assistive device: No device  Walk up/down 4 steps activity   Walk up/down 4 steps assist level: Contact Guard/Touching assist Walk up/down 4 steps assistive device: 1 hand rail  Walk up/down 12 steps activity   Walk up/down 12 steps assist level: Contact Guard/Touching assist Walk up/down 12 steps assistive device: 1 hand rail  Pick up small objects from floor Pick up small object from the floor (from standing position) activity did not occur: Safety/medical concerns (d/t pt precautions and pt unable to do this safely prior to hospital stay)      Wheelchair Is the patient using a wheelchair?: Yes Type of Wheelchair: Manual   Wheelchair assist level: Dependent - Patient 0%    Wheel 50 feet with 2 turns activity   Assist Level: Dependent - Patient 0%  Wheel 150 feet activity   Assist Level: Dependent - Patient 0%    Refer to Care Plan for Long Term Goals  SHORT TERM GOAL WEEK 1 PT Short Term Goal 1 (Week 1): STG=LTG d/t ELOS  Recommendations for other services: None   Skilled Therapeutic Intervention Mobility Bed Mobility Bed Mobility: Rolling Right;Rolling Left;Supine to Sit Rolling Right: Supervision/verbal cueing Rolling Left: Supervision/Verbal cueing Supine to Sit: Minimal Assistance - Patient > 75% Transfers Transfers: Sit to Stand;Stand to Sit;Stand Pivot Transfers;Transfer Sit to Stand: Contact Guard/Touching assist Stand to Sit: Contact  Guard/Touching assist Stand Pivot Transfers: Contact Guard/Touching assist Transfer (Assistive device): Rolling walker Locomotion  Gait Ambulation: Yes Gait Assistance: Contact Guard/Touching assist Gait Distance (Feet): 665 Feet Assistive device: None Gait Gait: Yes Gait Pattern: Decreased stance time - right;Decreased step length - left;Shuffle Gait velocity: decr Stairs / Additional Locomotion Stairs: Yes Stairs Assistance: Contact Guard/Touching assist Stair Management Technique: One rail Right;One rail Left (up with R rail, down with L rail (RUE support)) Number of Stairs: 12 Height of Stairs: 6 Ramp: Contact Guard/touching assist Curb: Doctor, hospital Mobility: Yes Wheelchair Assistance: Dependent - Patient 0%  Session 1:  Evaluation completed (see details above) with patient education regarding purpose of PT evaluation, PT POC and goals, therapy schedule, weekly team meetings, and other CIR information including safety plan and fall risk safety. Pt performed the below functional mobility tasks with the specified levels of skilled cuing and assistance.   Pt received supine in bed and agreeable to therapy evaluation. Pt has no c/o pain at this time.  Pt notes history of incontinence s/p prostatectomy. Discussed purpose of pelvic floor PT and potentially to seek services to manage incontinence after d/c from CIR. Pt requests brief change. Pt able to roll with minA bilaterally to don brief, SPT dependently performed pericare and applied barrier cream, pt noted to have blanchable redness and skin irritation in on both proximal thighs and in perineal area.   Pt ambulated 400' with RW and close SBA, demonstrated gait pattern described above. Pt requested to go to the bathroom - incontinent of bladder, continent of bowel - documented in flowsheet. SPT dependently performed pericare and applied barrier cream. Pt left seated in bed with all  needs in reach and alarm on.     Session 2:  Direct handoff from nsg during pt toileting - documented in flowsheet. SPT dependently performed pericare with pt in standing with RW. Pt dependently transported in w/c to car simulator - CGA car transfer with no AD. Pt notes the upper rim of his car is lower and d/t  hx of cervical spine fusion he is not able to bend his neck enough to get into car - discussed having wife or daughter bring car to practice car transfer. See assist levels above for ramp, mulch, and curb step.   Outcome measures: - TUG: 26s, SBA no AD - Dual task TUG (holding cup of water): 34.7s, pt needed verbal cue to turn around at cone, SBA no AD - : 64' without AD, SBA, see gait pattern described above. RPE 7/10. starting vitals: 93/65 (76) mmHg, 86 bpm. Ending Vitals: 116/79 (90) mmHg, 119 bpm.  Pt notes history of R achilles pain for 3 years - tried OPPT with limited success. Achilles not tender to palpation, decreased pain with repeated heel raises (with UE support), increased pain with end range passive DF. Pt notes that pain typically decreases after walking some, and pain never continues or worsens with ambulation. Primarily experiences pain with step-through gait pattern on stairs (consistent with ROM limitations). No c/o pain radiating to foot or pain first thing in AM.  [name] primary impairments are decreased cardiorespiratory endurance, generalized weakness d/t extended hospital stay, shuffling gait, and R achilles pain consistent with chronic achilles tendinopathy.   Pt dependently transported back to room d/t fatigue. Pt requests brief change - notified nsg. Pt left supine in bed with all needs in reach and alarm on.     Discharge Criteria: Patient will be discharged from PT if patient refuses treatment 3 consecutive times without medical reason, if treatment goals not met, if there is a change in medical status, if patient makes no progress towards goals or if  patient is discharged from hospital.  The above assessment, treatment plan, treatment alternatives and goals were discussed and mutually agreed upon: by patient  Collins Scotland 06/19/2023, 4:13 PM

## 2023-06-19 NOTE — Progress Notes (Signed)
 CVAD removed per protocol per MD order. Manual pressure applied for 3 mins. Vaseline gauze, gauze, and Tegaderm applied over insertion site. No bleeding or swelling noted. Instructed patient to remain in bed for thirty mins. Educated patient about S/S of infection and when to call MD; no heavy lifting or pressure on R side for 24 hours; keep dressing dry and intact for 24 hours. Pt verbalized comprehension.

## 2023-06-19 NOTE — Progress Notes (Signed)
 Inpatient Rehabilitation  Patient information reviewed and entered into eRehab system by [facility]. Karen Kays., CCC/SLP, PPS Coordinator.  Information including medical coding, functional ability and quality indicators will be reviewed and updated through discharge.

## 2023-06-19 NOTE — Plan of Care (Signed)
  Problem: Consults Goal: RH GENERAL PATIENT EDUCATION Description: See Patient Education module for education specifics. Outcome: Progressing   Problem: Consults Goal: RH GENERAL PATIENT EDUCATION Description: See Patient Education module for education specifics. Outcome: Progressing   Problem: RH BOWEL ELIMINATION Goal: RH STG MANAGE BOWEL WITH ASSISTANCE Description: STG Manage Bowel with Assistance. Outcome: Progressing   Problem: RH BLADDER ELIMINATION Goal: RH STG MANAGE BLADDER WITH ASSISTANCE Description: STG Manage Bladder With Assistance Outcome: Progressing   Problem: RH SKIN INTEGRITY Goal: RH STG SKIN FREE OF INFECTION/BREAKDOWN Outcome: Progressing   Problem: RH SAFETY Goal: RH STG ADHERE TO SAFETY PRECAUTIONS W/ASSISTANCE/DEVICE Description: STG Adhere to Safety Precautions With Assistance/Device. Outcome: Progressing   Problem: RH PAIN MANAGEMENT Goal: RH STG PAIN MANAGED AT OR BELOW [name] PAIN GOAL Outcome: Progressing   Problem: RH KNOWLEDGE DEFICIT GENERAL Goal: RH STG INCREASE KNOWLEDGE OF SELF CARE AFTER HOSPITALIZATION Outcome: Progressing

## 2023-06-19 NOTE — Plan of Care (Signed)
  Problem: RH Balance Goal: LTG Patient will maintain dynamic standing with ADLs (OT) Description: LTG:  Patient will maintain dynamic standing balance with assist during activities of daily living (OT)  Flowsheets (Taken 06/19/2023 1237) LTG: Pt will maintain dynamic standing balance during ADLs with: Independent with assistive device   Problem: RH Grooming Goal: LTG Patient will perform grooming w/assist,cues/equip (OT) Description: LTG: Patient will perform grooming with assist, with/without cues using equipment (OT) Flowsheets (Taken 06/19/2023 1237) LTG: Pt will perform grooming with assistance level of: Independent with assistive device    Problem: RH Bathing Goal: LTG Patient will bathe all body parts with assist levels (OT) Description: LTG: Patient will bathe all body parts with assist levels (OT) Flowsheets (Taken 06/19/2023 1237) LTG: Pt will perform bathing with assistance level/cueing: Independent with assistive device    Problem: RH Dressing Goal: LTG Patient will perform upper body dressing (OT) Description: LTG Patient will perform upper body dressing with assist, with/without cues (OT). Flowsheets (Taken 06/19/2023 1237) LTG: Pt will perform upper body dressing with assistance level of: Independent with assistive device Goal: LTG Patient will perform lower body dressing w/assist (OT) Description: LTG: Patient will perform lower body dressing with assist, with/without cues in positioning using equipment (OT) Flowsheets (Taken 06/19/2023 1237) LTG: Pt will perform lower body dressing with assistance level of: Independent with assistive device   Problem: RH Toileting Goal: LTG Patient will perform toileting task (3/3 steps) with assistance level (OT) Description: LTG: Patient will perform toileting task (3/3 steps) with assistance level (OT)  Flowsheets (Taken 06/19/2023 1237) LTG: Pt will perform toileting task (3/3 steps) with assistance level: Independent with assistive  device   Problem: RH Toilet Transfers Goal: LTG Patient will perform toilet transfers w/assist (OT) Description: LTG: Patient will perform toilet transfers with assist, with/without cues using equipment (OT) Flowsheets (Taken 06/19/2023 1237) LTG: Pt will perform toilet transfers with assistance level of: Independent with assistive device   Problem: RH Tub/Shower Transfers Goal: LTG Patient will perform tub/shower transfers w/assist (OT) Description: LTG: Patient will perform tub/shower transfers with assist, with/without cues using equipment (OT) Flowsheets (Taken 06/19/2023 1237) LTG: Pt will perform tub/shower stall transfers with assistance level of: Independent with assistive device

## 2023-06-19 NOTE — Progress Notes (Signed)
 [facility] Individual Statement of Services  Patient Name:  [name] [name]  Date:  06/19/2023  Welcome to the [facility].  Our goal is to provide you with an individualized program based on your diagnosis and situation, designed to meet your specific needs.  With this comprehensive rehabilitation program, you will be expected to participate in at least 3 hours of rehabilitation therapies Monday-Friday, with modified therapy programming on the weekends.  Your rehabilitation program will include the following services:  Physical Therapy (PT), Occupational Therapy (OT), 24 hour per day rehabilitation nursing, Care Coordinator, Rehabilitation Medicine, Nutrition Services, and Pharmacy Services  Weekly team conferences will be held on Tuesday to discuss your progress.  Your Inpatient Rehabilitation Care Coordinator will talk with you frequently to get your input and to update you on team discussions.  Team conferences with you and your family in attendance may also be held.  Expected length of stay: 5-7 days  Overall anticipated outcome: Independent with device  Depending on your progress and recovery, your program may change. Your Inpatient Rehabilitation Care Coordinator will coordinate services and will keep you informed of any changes. Your Inpatient Rehabilitation Care Coordinator's name and contact numbers are listed  below.  The following services may also be recommended but are not provided by the Inpatient [facility]:  Driving Evaluations Home Health Rehabiltiation Services Outpatient Rehabilitation Services    Arrangements will be made to provide these services after discharge if needed.  Arrangements include referral to agencies that provide these services.  Your insurance has been verified to be:  [employer] Your primary doctor is:  Lynnea Ferrier  Pertinent information will be shared with your doctor and your insurance  company.  Inpatient Rehabilitation Care Coordinator:  Dossie Der, [place] [phone] or Luna Glasgow  Information discussed with and copy given to patient by: Lucy Chris, 06/19/2023, 1:31 PM

## 2023-06-20 MED ORDER — NYSTATIN 100000 UNIT/GM EX POWD
Freq: Two times a day (BID) | CUTANEOUS | Status: DC
  Filled 2023-06-20 (×2): qty 15

## 2023-06-20 NOTE — Plan of Care (Signed)
  Problem: Consults Goal: RH GENERAL PATIENT EDUCATION Description: See Patient Education module for education specifics. Outcome: Progressing   Problem: RH BOWEL ELIMINATION Goal: RH STG MANAGE BOWEL WITH ASSISTANCE Description: STG Manage Bowel with  MOD I Assistance. Outcome: Progressing   Problem: RH BLADDER ELIMINATION Goal: RH STG MANAGE BLADDER WITH ASSISTANCE Description: STG Manage Bladder With  Mod I Assistance Outcome: Progressing   Problem: RH SKIN INTEGRITY Goal: RH STG SKIN FREE OF INFECTION/BREAKDOWN Description: STG Manage skin free from infection/breakdown with  MOD I Assistance Outcome: Progressing   Problem: RH SAFETY Goal: RH STG ADHERE TO SAFETY PRECAUTIONS W/ASSISTANCE/DEVICE Description: STG Adhere to Safety Precautions With  mod I Assistance/Device. Outcome: Progressing   Problem: RH PAIN MANAGEMENT Goal: RH STG PAIN MANAGED AT OR BELOW [name] PAIN GOAL Description: <4 w/ prns Outcome: Progressing

## 2023-06-20 NOTE — Progress Notes (Signed)
 Occupational Therapy Session Note  Patient Details  Name: [name] [name] MRN: [id number] Date of Birth: [birth year]  Today's Date: 06/20/2023 OT Individual Time: 7829-5621 OT Individual Time Calculation (min): 43 min    Short Term Goals: Week 1:  OT Short Term Goal 1 (Week 1): STG=LTG d/t ELOS  Skilled Therapeutic Interventions/Progress Updates: Patient received sitting up in bed. Agreeable to getting up for OT treatment. Patient needing to use the restroom, ambulated to the toilet with the RW with CGA. Patient able to manage clothing and peri care, but needed assist to don new side closure brief. Ambulated to the sink for grooming tasks. Continued treatment with functional transfer safety working on improved independence with forward and back scooting in the bed. Patient had significant difficulty with not using BUE's. Continued with bed mobility practice at the therapy mat so patient could practice forward and back scooting with less resistance. Attempted from hight similar to his home set up, but patient  felt unable to scoot back far enough to feel safe. At lowest setting patient able to scoot back and work on slowly transitioning from EOB to side then rolling to his back. Patient required min assist managing LE's and controlling UB decent. Mod assist needed to transition from supine to EOB. Patient with good motivation and safety awareness throughout treatment. Continue to work on transfer safety and endurance to improve independence with self care.      Therapy Documentation Precautions:  Precautions Precautions: Sternal, Fall, ICD/Pacemaker Precaution Booklet Issued: Yes (comment) Recall of Precautions/Restrictions: Intact Precaution/Restrictions Comments: able to recall precautions Restrictions Weight Bearing Restrictions Per Provider Order: Yes RUE Weight Bearing Per Provider Order: Non weight bearing LUE Weight Bearing Per Provider Order: Non weight bearing Other Position/Activity  Restrictions: sternal precautions/ICD/pacemaker precautions    Pain:no c/o   ADL: ADL  Grooming: Supervision/safety Where Assessed-Grooming: Standing at sink Upper Body Bathing: Supervision/safety Where Assessed-Upper Body Bathing: Sitting at sink Lower Body Bathing: Minimal assistance Where Assessed-Lower Body Bathing: Standing at sink, Sitting at sink Upper Body Dressing: Supervision/safety Where Assessed-Upper Body Dressing: Edge of bed Lower Body Dressing: Minimal assistance Where Assessed-Lower Body Dressing: Edge of bed, Standing at sink Toileting: Minimal assistance Where Assessed-Toileting: Bedside Commode, Toilet Toilet Transfer: Furniture conservator/restorer Method: Ambulating     Therapy/Group: Individual Therapy  Warnell Forester 06/20/2023, 11:47 AM

## 2023-06-20 NOTE — Progress Notes (Signed)
 Occupational Therapy Session Note  Patient Details  Name: [name] MRN: [id number] Date of Birth: 10/14/1945  Today's Date: 06/20/2023 OT Individual Time: 8295-6213 OT Individual Time Calculation (min): 58 min    Short Term Goals: Week 1:  OT Short Term Goal 1 (Week 1): STG=LTG d/t ELOS  Skilled Therapeutic Interventions/Progress Updates:    OT intervention with focus on sit<>stand, standing balance, functional amb with RW, bathing at shower level, and dressing with sit<>stand from w/c to increase independence with bADLs. Amb with RW at [facility] to bathroom and transfer to TTB. Pt required assistance with bathing feet without AE but completed all other tasks at supervision level. Min A for UB/LB dressing tasks. Min verbal cues for sternal precautions with sit<>stand but otherwise maintained during BADLs. Pt remarked how tiring tasks were but pleased with his ability to complete tasks with minimal assistance. Pt remained in w/c at sink to complete grooming tasks. Belt alarm activated.   Therapy Documentation Precautions:  Precautions Precautions: Sternal, Fall, ICD/Pacemaker Precaution Booklet Issued: Yes (comment) Recall of Precautions/Restrictions: Intact Precaution/Restrictions Comments: able to recall precautions Restrictions Weight Bearing Restrictions Per Provider Order: Yes RUE Weight Bearing Per Provider Order: Non weight bearing LUE Weight Bearing Per Provider Order: Non weight bearing Other Position/Activity Restrictions: sternal precautions/ICD/pacemaker precautions Pain:  Pt denies pain this afternoon   Therapy/Group: Individual Therapy  [name] 06/20/2023, 2:46 PM

## 2023-06-20 NOTE — Progress Notes (Signed)
 PROGRESS NOTE   Subjective/Complaints:  Pt reports doesn't remember me from yesterday.  Needs oral suctioning due ot secretions.  Good therapy yesterday pushed him more than epxected, but now feels stronger.  Got to bathroom with nursing after the therapy LBM 2-3 Bms yesterday Also ate better- better appetite  ROS:   Pt denies SOB, abd pain, CP, N/V/C/D, and vision changes    Objective:   No results found. Recent Labs    06/18/23 0500 06/19/23 0545  WBC 8.7 8.2  HGB 10.9* 10.8*  HCT 33.7* 33.6*  PLT 235 257   Recent Labs    06/19/23 0545  NA 135  K 3.9  CL 107  CO2 23  GLUCOSE 90  BUN 16  CREATININE 1.01  CALCIUM 7.8*    Intake/Output Summary (Last 24 hours) at 06/20/2023 0831 Last data filed at 06/20/2023 1610 Gross per 24 hour  Intake 718 ml  Output --  Net 718 ml        Physical Exam: Vital Signs Blood pressure 113/68, pulse 92, temperature 98 F (36.7 C), resp. rate 16, height 5\' 10"  (1.778 m), weight 84.6 kg, SpO2 98%.     General: awake, alert, appropriate, much more awake, NAD HENT: conjugate gaze; oropharynx moist CV: regular rate- in afib; no JVD Pulmonary: CTA B/L; no W/R/R- good air movement GI: soft, NT, ND, (+)BS Psychiatric: appropriate Neurological: Ox3  Skin:Groin is slightly macerated in purewick shape and red/erythematous; Sternal incision and abd incision and pacemaker incision look good   Assessment/Plan: 1. Functional deficits which require 3+ hours per day of interdisciplinary therapy in a comprehensive inpatient rehab setting. Physiatrist is providing close team supervision and 24 hour management of active medical problems listed below. Physiatrist and rehab team continue to assess barriers to discharge/monitor patient progress toward functional and medical goals  Care Tool:  Bathing              Bathing assist Assist Level: Minimal Assistance - Patient >  75%     Upper Body Dressing/Undressing Upper body dressing        Upper body assist Assist Level: Supervision/Verbal cueing    Lower Body Dressing/Undressing Lower body dressing            Lower body assist Assist for lower body dressing: Minimal Assistance - Patient > 75%     Toileting Toileting    Toileting assist Assist for toileting: Minimal Assistance - Patient > 75%     Transfers Chair/bed transfer  Transfers assist     Chair/bed transfer assist level: Contact Guard/Touching assist     Locomotion Ambulation   Ambulation assist      Assist level: Contact Guard/Touching assist Assistive device: No Device Max distance: 665   Walk 10 feet activity   Assist     Assist level: Contact Guard/Touching assist Assistive device: No Device   Walk 50 feet activity   Assist    Assist level: Contact Guard/Touching assist Assistive device: No Device    Walk 150 feet activity   Assist    Assist level: Contact Guard/Touching assist Assistive device: No Device    Walk 10 feet on uneven surface  activity  Assist     Assist level: Contact Guard/Touching assist     Wheelchair     Assist Is the patient using a wheelchair?: Yes Type of Wheelchair: Manual    Wheelchair assist level: Dependent - Patient 0%      Wheelchair 50 feet with 2 turns activity    Assist        Assist Level: Dependent - Patient 0%   Wheelchair 150 feet activity     Assist      Assist Level: Dependent - Patient 0%   Blood pressure 113/68, pulse 92, temperature 98 F (36.7 C), resp. rate 16, height 5\' 10"  (1.778 m), weight 84.6 kg, SpO2 98%.   Medical Problem List and Plan: 1. Functional deficits secondary to debility after MVR/TVR/maze/GI bleed/hemorrhagic shock.  Sternal precautions             -patient may shower since PICC out             -ELOS/Goals: 5-7 days S             Con't CIR PT and OT 2.   Antithrombotics: -DVT/anticoagulation:  Pharmaceutical: Eliquis             -antiplatelet therapy: Aspirin 81 mg daily 3. Pain Management: Tylenol as needed  3/14- denies pain 4. Mood/Behavior/Sleep: Provide emotional support             -antipsychotic agents: N/A 5. Neuropsych/cognition: This patient is capable of making decisions on his own behalf. 6. Skin/Wound Care: Routine skin checks  3/13- has groin maceration that he came with- due to prior male purewick- will do skin protection treatment 7. Fluids/Electrolytes/Nutrition: Routine in and outs with follow-up chemistries 8.  GI bleed.  Status post colonoscopy 06/14/2023 results showed diverticula on right and left colon patchy inflammation multiple sessile polyps and linear ulcerations consistent with ischemic colitis, 1 large lesion distal transverse colon biopsied (removal as an outpatient).  Biopsy pending.  continue Protonix 40 mg twice daily.  Gastroenterology services have signed off   3/14- His Hb is stable yesterday 9.  Acute blood loss anemia.  Follow-up CBC   3/13- is stable- con't to monitor 10.  Leukocytosis resolved.  Empiric Zosyn discontinued, check WBC tomorrow   3/14- WBC 8.2 11.  A-fib with RVR/DDCV/pacemaker 2/25.  Cardiac rate controlled.  Amiodarone 400 mg twice daily, Lanoxin 0.125 mg daily.  Cardiac rate controlled. Check magnesium level tomorrow.   3/13- Mg level 1.9- K+ 3.9-  12. Hx of prostate CA  3/13- wil get him bladder pads for bladder incontinence since prostate CA   I spent a total of  35  minutes on total care today- >50% coordination of care- due to  D/w nursing as well as OT- pt can shower since PICC out- and will need incisions covered  LOS: 2 days A FACE TO FACE EVALUATION WAS PERFORMED  Aritzel Krusemark 06/20/2023, 8:31 AM

## 2023-06-20 NOTE — Progress Notes (Signed)
 Physical Therapy Session Note  Patient Details  Name: [name] [name] MRN: [id number] Date of Birth: 1946-01-08  Today's Date: 06/20/2023 PT Individual Time: 1000-1045 PT Individual Time Calculation (min): 45 min   Short Term Goals: Week 1:  PT Short Term Goal 1 (Week 1): STG=LTG d/t ELOS  Skilled Therapeutic Interventions/Progress Updates: Pt presents supine in bed and agreeable to therapy.  Pt rolls to R w/ supervision but requires min A for sidelying to sit and increased education given for log roll technique to perform.  Pt required A for donning shoes.  Pt transfers sit to stand w/ CGA.  Pt amb to sink to comb hair.  Pt amb w/o AD and CGA/supervision at least 150' w/ cues for posture and arm swing.  Pt performed 2 x 4' Nu-step at Level 3 for LES only.  Pt amb to main gym.  Pt amb to room w/o AD and CGA, continued cues for posture and arm swing.  Pt educated on log roll technique sit > sidelying and then performed x 1 more trial.  Pt remained in bed w/ bed alarm on and all needs in reach.     Therapy Documentation Precautions:  Precautions Precautions: Sternal, Fall, ICD/Pacemaker Precaution Booklet Issued: Yes (comment) Recall of Precautions/Restrictions: Intact Precaution/Restrictions Comments: able to recall precautions Restrictions Weight Bearing Restrictions Per Provider Order: Yes RUE Weight Bearing Per Provider Order: Non weight bearing LUE Weight Bearing Per Provider Order: Non weight bearing Other Position/Activity Restrictions: sternal precautions/ICD/pacemaker precautions General:   Vital Signs:   Pain:0/10       Therapy/Group: Individual Therapy  Lucio Edward 06/20/2023, 10:52 AM

## 2023-06-20 NOTE — IPOC Note (Signed)
 Overall Plan of Care [facility]) Patient Details Name: [name] [name] MRN: [id number] DOB: [birth year]  Admitting Diagnosis: Debility  Hospital Problems: Principal Problem:   Debility     Functional Problem List: Nursing Endurance, Perception, Medication Management, Safety, Sensory  PT Balance, Endurance  OT Balance, Safety, Endurance, Motor  SLP    TR         Basic ADL's: OT Grooming, Bathing, Dressing, Toileting     Advanced  ADL's: OT       Transfers: PT Bed Mobility, Bed to Chair, Car, Occupational psychologist, Research scientist (life sciences): PT Ambulation, Stairs     Additional Impairments: OT None  SLP        TR      Anticipated Outcomes Item Anticipated Outcome  Self Feeding mod I  Swallowing      Basic self-care  mod I  Toileting  mod I   Bathroom Transfers mod I  Bowel/Bladder  n/a  Transfers  mod(I)  Locomotion  mod(I)  Communication     Cognition     Pain  <4 w/ prns  Safety/Judgment  manage safety with supervision   Therapy Plan: PT Intensity: Minimum of 1-2 x/day ,45 to 90 minutes PT Frequency: 5 out of 7 days PT Duration Estimated Length of Stay: 5-7 days OT Intensity: Minimum of 1-2 x/day, 45 to 90 minutes OT Frequency: 5 out of 7 days OT Duration/Estimated Length of Stay: 5-7 days     Team Interventions: Nursing Interventions Patient/Family Education, Disease Management/Prevention, Medication Management  PT interventions Ambulation/gait training, Community reintegration, Museum/gallery curator, UE/LE Strength taining/ROM, Discharge planning, Pain management, Therapeutic Activities, Disease management/prevention, Functional mobility training, Patient/family education, Therapeutic Exercise  OT Interventions Balance/vestibular training, Discharge planning, Self Care/advanced ADL retraining, Therapeutic Activities, UE/LE Coordination activities, Disease mangement/prevention, Functional mobility training, Patient/family education, Skin care/wound  managment, Therapeutic Exercise, Community reintegration, Fish farm manager, Neuromuscular re-education, Psychosocial support, UE/LE Strength taining/ROM  SLP Interventions    TR Interventions    SW/CM Interventions Discharge Planning, Psychosocial Support, Patient/Family Education   Barriers to Discharge MD  Medical stability, Home enviroment access/loayout, Wound care, Weight, and Weight bearing restrictions  Nursing Decreased caregiver support, Home environment access/layout discharge House  Home Access: Level entry  Entrance Stairs-Number of Steps: 2  Home Layout: Two level, Able to live on main level with bedroom/bathroom  PT Weight bearing restrictions sternal and pacemaker precautions  OT      SLP      SW Decreased caregiver support, Insurance for SNF coverage, Weight bearing restrictions     Team Discharge Planning: Destination: PT-Home ,OT- Home , SLP-  Projected Follow-up: PT-Outpatient PT, OT-  Home health OT, SLP-  Projected Equipment Needs: PT-To be determined, OT- To be determined, SLP-  Equipment Details: PT-TBD, OT-  Patient/family involved in discharge planning: PT- Patient,  OT-Patient, SLP-   MD ELOS: 5-7 days Medical Rehab Prognosis:  Good Assessment: The patient has been admitted for CIR therapies with the diagnosis of AVR/TVR and MAZE procedure-debility. The team will be addressing functional mobility, strength, stamina, balance, safety, adaptive techniques and equipment, self-care, bowel and bladder mgt, patient and caregiver education, . Goals have been set at mod I. Anticipated discharge destination is home.        See Team Conference Notes for weekly updates to the plan of care

## 2023-06-20 NOTE — Progress Notes (Signed)
 Physical Therapy Session Note  Patient Details  Name: [name] [name] MRN: [id number] Date of Birth: [birth year]  Today's Date: 06/20/2023 PT Individual Time: 1914-7829 PT Individual Time Calculation (min): 42 min   Short Term Goals: Week 1:  PT Short Term Goal 1 (Week 1): STG=LTG d/t ELOS  Skilled Therapeutic Interventions/Progress Updates: Pt presented in bed agreeable to therapy. Pt denies pain at start of session. Session focused on general conditioning, gait, and transfers, Pt completed supine to sit with CGA, use of bed features and increased time. Pt stood with CGA without AD and ambulated to main gym with CGA. Pt noted to have decreased to near absent arm swing, and decreased B foot clearance. PTA then provided pt with light dowels (boomsticks) and had pt ambulate around gym holding onto items. Pt encouraged to increase arm swing with PTA providing intermittent facilitation. Pt also participated in seated periscapular therex/AROM for posture with PTA providing education on performing some of these activities during healing stage to decrease scar tissue adhesion. Pt performed neck flexion/extension, L/R rotation, chin tucks, shoulder shrugs, backwards shoulder circles, and scap pinches x 10-12 each. Participated in Sit to stand without AD x 8 for posterior closed chain ms activation. Pt ambulated back to room however noted to have some carryover for arm swing. In room pt compelted sit to supine with minA for BLE management. Pt left in bed at end of session with bed alarm on, call bell within reach and needs met.      Therapy Documentation Precautions:  Precautions Precautions: Sternal, Fall, ICD/Pacemaker Precaution Booklet Issued: Yes (comment) Recall of Precautions/Restrictions: Intact Precaution/Restrictions Comments: able to recall precautions Restrictions Weight Bearing Restrictions Per Provider Order: Yes RUE Weight Bearing Per Provider Order: Non weight bearing LUE Weight Bearing  Per Provider Order: Non weight bearing Other Position/Activity Restrictions: sternal precautions/ICD/pacemaker precautions General:   Vital Signs: Therapy Vitals Temp: 97.8 F (36.6 C) Temp Source: Oral Pulse Rate: 92 Resp: 18 BP: 115/65 Patient Position (if appropriate): Sitting Oxygen Therapy SpO2: 100 % O2 Device: Room Air   Therapy/Group: Individual Therapy  Ameya Vowell 06/20/2023, 4:41 PM

## 2023-06-21 DIAGNOSIS — I482 Chronic atrial fibrillation, unspecified: Secondary | ICD-10-CM

## 2023-06-21 DIAGNOSIS — D62 Acute posthemorrhagic anemia: Secondary | ICD-10-CM

## 2023-06-21 NOTE — Progress Notes (Signed)
 PROGRESS NOTE   Subjective/Complaints:  No issues overnite , wondering when he will be discharged, remembers he came in on a Wednesday and hope he can be discharged next Wednesday   ROS:   Pt denies SOB, abd pain, CP, N/V/C/D,     Objective:   No results found. Recent Labs    06/19/23 0545  WBC 8.2  HGB 10.8*  HCT 33.6*  PLT 257   Recent Labs    06/19/23 0545  NA 135  K 3.9  CL 107  CO2 23  GLUCOSE 90  BUN 16  CREATININE 1.01  CALCIUM 7.8*    Intake/Output Summary (Last 24 hours) at 06/21/2023 0829 Last data filed at 06/20/2023 2116 Gross per 24 hour  Intake 360 ml  Output --  Net 360 ml        Physical Exam: Vital Signs Blood pressure 98/61, pulse 75, temperature 98.3 F (36.8 C), temperature source Oral, resp. rate 18, height 5\' 10"  (1.778 m), weight 84.5 kg, SpO2 95%.  General: No acute distress Mood and affect are appropriate Heart: Regular rate and rhythm no rubs murmurs or extra sounds Lungs: Clear to auscultation, breathing unlabored, no rales or wheezes Abdomen: Positive bowel sounds, soft nontender to palpation, nondistended Extremities: No clubbing, cyanosis, or edema 4+ /5 in BUE and BLE, tone is normal coordiation is normal Speech without dysarthria or aphasia     Skin: Sternal incision and abd incision and pacemaker incision look good   Assessment/Plan: 1. Functional deficits which require 3+ hours per day of interdisciplinary therapy in a comprehensive inpatient rehab setting. Physiatrist is providing close team supervision and 24 hour management of active medical problems listed below. Physiatrist and rehab team continue to assess barriers to discharge/monitor patient progress toward functional and medical goals  Care Tool:  Bathing    Body parts bathed by patient: Right arm, Left arm, Chest, Abdomen, Front perineal area, Buttocks, Right upper leg, Left upper leg, Face    Body parts bathed by helper: Right lower leg, Left lower leg     Bathing assist Assist Level: Minimal Assistance - Patient > 75%     Upper Body Dressing/Undressing Upper body dressing   What is the patient wearing?: Button up shirt    Upper body assist Assist Level: Minimal Assistance - Patient > 75%    Lower Body Dressing/Undressing Lower body dressing      What is the patient wearing?: Pants, Incontinence brief     Lower body assist Assist for lower body dressing: Minimal Assistance - Patient > 75%     Toileting Toileting    Toileting assist Assist for toileting: Minimal Assistance - Patient > 75%     Transfers Chair/bed transfer  Transfers assist     Chair/bed transfer assist level: Contact Guard/Touching assist     Locomotion Ambulation   Ambulation assist      Assist level: Contact Guard/Touching assist Assistive device: No Device Max distance: 150   Walk 10 feet activity   Assist     Assist level: Contact Guard/Touching assist Assistive device: No Device   Walk 50 feet activity   Assist    Assist level: Contact Guard/Touching assist  Assistive device: No Device    Walk 150 feet activity   Assist    Assist level: Contact Guard/Touching assist Assistive device: No Device    Walk 10 feet on uneven surface  activity   Assist     Assist level: Contact Guard/Touching assist Assistive device: Other (comment) (no device)   Wheelchair     Assist Is the patient using a wheelchair?: Yes Type of Wheelchair: Manual    Wheelchair assist level: Dependent - Patient 0%      Wheelchair 50 feet with 2 turns activity    Assist        Assist Level: Dependent - Patient 0%   Wheelchair 150 feet activity     Assist      Assist Level: Dependent - Patient 0%   Blood pressure 98/61, pulse 75, temperature 98.3 F (36.8 C), temperature source Oral, resp. rate 18, height 5\' 10"  (1.778 m), weight 84.5 kg, SpO2  95%.   Medical Problem List and Plan: 1. Functional deficits secondary to debility after MVR/TVR/maze/GI bleed/hemorrhagic shock.  Sternal precautions             -patient may shower since PICC out             -ELOS/Goals: 5-7 days S             Con't CIR PT and OT 2.  Antithrombotics: -DVT/anticoagulation:  Pharmaceutical: Eliquis             -antiplatelet therapy: Aspirin 81 mg daily 3. Pain Management: Tylenol as needed  3/14- denies pain 4. Mood/Behavior/Sleep: Provide emotional support             -antipsychotic agents: N/A 5. Neuropsych/cognition: This patient is capable of making decisions on his own behalf. 6. Skin/Wound Care: Routine skin checks  3/13- has groin maceration that he came with- due to prior male purewick- will do skin protection treatment 7. Fluids/Electrolytes/Nutrition: Routine in and outs with follow-up chemistries 8.  GI bleed.  Status post colonoscopy 06/14/2023 results showed diverticula on right and left colon patchy inflammation multiple sessile polyps and linear ulcerations consistent with ischemic colitis, 1 large lesion distal transverse colon biopsied (removal as an outpatient).  Biopsy pending.  continue Protonix 40 mg twice daily.  Gastroenterology services have signed off   3/14- His Hb is stable yesterday 9.  Acute blood loss anemia.  Follow-up CBC      Latest Ref Rng & Units 06/19/2023    5:45 AM 06/18/2023    5:00 AM 06/17/2023    3:59 AM  CBC  WBC 4.0 - 10.5 K/uL 8.2  8.7  9.6   Hemoglobin 13.0 - 17.0 g/dL 16.1  09.6  04.5   Hematocrit 39.0 - 52.0 % 33.6  33.7  32.2   Platelets 150 - 400 K/uL 257  235  200     10.  Leukocytosis resolved.  Empiric Zosyn discontinued, check WBC tomorrow   3/14- WBC 8.2 11.  A-fib with RVR/DDCV/pacemaker 2/25.  Cardiac rate controlled.  Amiodarone 400 mg twice daily, Lanoxin 0.125 mg daily.  Cardiac rate controlled. Check magnesium level tomorrow.   3/13- Mg level 1.9- K+ 3.9-  12. Hx of prostate CA  3/13- wil  get him bladder pads for bladder incontinence since prostate CA   LOS: 3 days A FACE TO FACE EVALUATION WAS PERFORMED  [name] [name] 06/21/2023, 8:29 AM

## 2023-06-21 NOTE — Plan of Care (Signed)
  Problem: Consults Goal: RH GENERAL PATIENT EDUCATION Description: See Patient Education module for education specifics. Outcome: Progressing   Problem: RH BOWEL ELIMINATION Goal: RH STG MANAGE BOWEL WITH ASSISTANCE Description: STG Manage Bowel with  MOD I Assistance. Outcome: Progressing   Problem: RH BLADDER ELIMINATION Goal: RH STG MANAGE BLADDER WITH ASSISTANCE Description: STG Manage Bladder With  Mod I Assistance Outcome: Progressing   Problem: RH SKIN INTEGRITY Goal: RH STG SKIN FREE OF INFECTION/BREAKDOWN Description: STG Manage skin free from infection/breakdown with  MOD I Assistance Outcome: Progressing   Problem: RH SAFETY Goal: RH STG ADHERE TO SAFETY PRECAUTIONS W/ASSISTANCE/DEVICE Description: STG Adhere to Safety Precautions With  mod I Assistance/Device. Outcome: Progressing   Problem: RH PAIN MANAGEMENT Goal: RH STG PAIN MANAGED AT OR BELOW [name] PAIN GOAL Description: <4 w/ prns Outcome: Progressing   Problem: RH KNOWLEDGE DEFICIT GENERAL Goal: RH STG INCREASE KNOWLEDGE OF SELF CARE AFTER HOSPITALIZATION Description: Manage increase  knowledge of self care after hospitalization with  MOD I Assistance using educational materials provided Outcome: Progressing

## 2023-06-22 NOTE — Progress Notes (Signed)
 PROGRESS NOTE   Subjective/Complaints:  Continent BM yesterday evening  Did not have therapy yesterday per pt, no therapy notes  Discussed sternal prec Discussed abd exercises  ROS: Pt denies SOB, abd pain, CP, N/V/C/D,     Objective:   No results found. No results for input(s): "WBC", "HGB", "HCT", "PLT" in the last 72 hours.  No results for input(s): "NA", "K", "CL", "CO2", "GLUCOSE", "BUN", "CREATININE", "CALCIUM" in the last 72 hours.   Intake/Output Summary (Last 24 hours) at 06/22/2023 0714 Last data filed at 06/21/2023 1900 Gross per 24 hour  Intake 600 ml  Output --  Net 600 ml        Physical Exam: Vital Signs Blood pressure 106/65, pulse 78, temperature 98 F (36.7 C), temperature source Oral, resp. rate 18, height 5\' 10"  (1.778 m), weight 84.5 kg, SpO2 97%.  General: No acute distress Mood and affect are appropriate Heart: Regular rate and rhythm no rubs murmurs or extra sounds Lungs: Clear to auscultation, breathing unlabored, no rales or wheezes Abdomen: Positive bowel sounds, soft nontender to palpation, nondistended Extremities: No clubbing, cyanosis, or edema 4+ /5 in BUE and BLE, tone is normal coordiation is normal Speech without dysarthria or aphasia     Skin: Sternal incision and abd incision and pacemaker incision look good   Assessment/Plan: 1. Functional deficits which require 3+ hours per day of interdisciplinary therapy in a comprehensive inpatient rehab setting. Physiatrist is providing close team supervision and 24 hour management of active medical problems listed below. Physiatrist and rehab team continue to assess barriers to discharge/monitor patient progress toward functional and medical goals  Care Tool:  Bathing    Body parts bathed by patient: Right arm, Left arm, Chest, Abdomen, Front perineal area, Buttocks, Right upper leg, Left upper leg, Face   Body parts bathed  by helper: Right lower leg, Left lower leg     Bathing assist Assist Level: Minimal Assistance - Patient > 75%     Upper Body Dressing/Undressing Upper body dressing   What is the patient wearing?: Button up shirt    Upper body assist Assist Level: Minimal Assistance - Patient > 75%    Lower Body Dressing/Undressing Lower body dressing      What is the patient wearing?: Pants, Incontinence brief     Lower body assist Assist for lower body dressing: Minimal Assistance - Patient > 75%     Toileting Toileting    Toileting assist Assist for toileting: Minimal Assistance - Patient > 75%     Transfers Chair/bed transfer  Transfers assist     Chair/bed transfer assist level: Contact Guard/Touching assist     Locomotion Ambulation   Ambulation assist      Assist level: Contact Guard/Touching assist Assistive device: No Device Max distance: 150   Walk 10 feet activity   Assist     Assist level: Contact Guard/Touching assist Assistive device: No Device   Walk 50 feet activity   Assist    Assist level: Contact Guard/Touching assist Assistive device: No Device    Walk 150 feet activity   Assist    Assist level: Contact Guard/Touching assist Assistive device: No Device  Walk 10 feet on uneven surface  activity   Assist     Assist level: Contact Guard/Touching assist Assistive device: Other (comment) (no device)   Wheelchair     Assist Is the patient using a wheelchair?: Yes Type of Wheelchair: Manual    Wheelchair assist level: Dependent - Patient 0%      Wheelchair 50 feet with 2 turns activity    Assist        Assist Level: Dependent - Patient 0%   Wheelchair 150 feet activity     Assist      Assist Level: Dependent - Patient 0%   Blood pressure 106/65, pulse 78, temperature 98 F (36.7 C), temperature source Oral, resp. rate 18, height 5\' 10"  (1.778 m), weight 84.5 kg, SpO2 97%.   Medical Problem  List and Plan: 1. Functional deficits secondary to debility after MVR/TVR/maze/GI bleed/hemorrhagic shock.  Sternal precautions             -patient may shower since PICC out             -ELOS/Goals: 5-7 days S             Con't CIR PT and OT 2.  Antithrombotics: -DVT/anticoagulation:  Pharmaceutical: Eliquis             -antiplatelet therapy: Aspirin 81 mg daily 3. Pain Management: Tylenol as needed  3/14- denies pain 4. Mood/Behavior/Sleep: Provide emotional support             -antipsychotic agents: N/A 5. Neuropsych/cognition: This patient is capable of making decisions on his own behalf. 6. Skin/Wound Care: Routine skin checks  3/13- has groin maceration that he came with- due to prior male purewick- will do skin protection treatment 7. Fluids/Electrolytes/Nutrition: Routine in and outs with follow-up chemistries 8.  GI bleed.  Status post colonoscopy 06/14/2023 results showed diverticula on right and left colon patchy inflammation multiple sessile polyps and linear ulcerations consistent with ischemic colitis, 1 large lesion distal transverse colon biopsied (removal as an outpatient).  Biopsy pending.  continue Protonix 40 mg twice daily.  Gastroenterology services have signed off   3/14- His Hb is stable yesterday 9.  Acute blood loss anemia.  Follow-up CBC      Latest Ref Rng & Units 06/19/2023    5:45 AM 06/18/2023    5:00 AM 06/17/2023    3:59 AM  CBC  WBC 4.0 - 10.5 K/uL 8.2  8.7  9.6   Hemoglobin 13.0 - 17.0 g/dL 16.1  09.6  04.5   Hematocrit 39.0 - 52.0 % 33.6  33.7  32.2   Platelets 150 - 400 K/uL 257  235  200     10.  Leukocytosis resolved.  Empiric Zosyn discontinued, check WBC tomorrow   3/14- WBC 8.2 11.  A-fib with RVR/DDCV/pacemaker 2/25.  Cardiac rate controlled.  Amiodarone 400 mg twice daily, Lanoxin 0.125 mg daily.  Cardiac rate controlled. Check magnesium level tomorrow.   3/13- Mg level 1.9- K+ 3.9-  12. Hx of prostate CA  3/13- wil get him bladder pads for  bladder incontinence since prostate CA   LOS: 4 days A FACE TO FACE EVALUATION WAS PERFORMED  [name] [name] 06/22/2023, 7:14 AM

## 2023-06-22 NOTE — Progress Notes (Signed)
 Occupational Therapy Session Note  Patient Details  Name: [name] [name] MRN: [id number] Date of Birth: Aug 23, 1945  Today's Date: 06/22/2023 OT Individual Time: 1191-4782 & 1450-1545 OT Individual Time Calculation (min): 60 min & 55 min   Short Term Goals: Week 1:  OT Short Term Goal 1 (Week 1): STG=LTG d/t ELOS  Skilled Therapeutic Interventions/Progress Updates:      Therapy Documentation Precautions:  Precautions Precautions: Sternal, Fall, ICD/Pacemaker Precaution Booklet Issued: Yes (comment) Recall of Precautions/Restrictions: Intact Precaution/Restrictions Comments: able to recall precautions Restrictions Weight Bearing Restrictions Per Provider Order: Yes RUE Weight Bearing Per Provider Order: Non weight bearing LUE Weight Bearing Per Provider Order: Non weight bearing Other Position/Activity Restrictions: sternal precautions/ICD/pacemaker precautions Session 1 General: "Thanks Huntley Dec!" Pt supine in bed upon OT arrival, agreeable to OT session.  Pain: no pain reported  ADL:  Pt completing full ADL including doffing/donning clothing, toileting, functional mobility and bathing. Pt completing all tasks at supervision with increased time. OT covering sutures and pacemaker site during bathing. Pt completing bathing in sitting on TTB, standing to complete peri area care in unsupported standing with no LOB/SOB. Pt also able to complete drying off after bathing in standing unsupported with SBA. Pt void BM after bathing and able to complete functional mobility from toilet with no AD at [facility] with VC attempting furniture walking.    Pt supine in bed with bed alarm activated, 2 bed rails up, call light within reach and 4Ps assessed.   Session 2 General: "I had a full day today!" Pt seated in recliner upon OT arrival, agreeable to OT.  Pain: no pain reported  ADL: OT instructing pt on rollator safety with locking/unclocking breaks and completing sit to stand transfer. Pt completed  safely with teach back method. OT educating pt on energy conservation purposes of rollator to use with mobility. Pt requiring bathroom at end of session, Pt completing all aspects of toileting with SBA without AD. Pt had small BM. Completed mobility back to bed with SBA without AD, small step length with no VC required for safety.   Exercises: OT instructing pt to complete following exercise circuit in order to improve functional activity, strength and endurance to prepare for ADLs such as bed mobility d/t pt reporting decreased core/glute strength. Pt completed the following exercises in seated/standing position with no noted LOB/SOB and 1x20 repetitions on each exercise: -modified sit ups using 1# ball for stability -russian twists with 1# ball -russian twists with flexed shoulders and 1# ball -standing squats with red theraband tied around lower quads for glute activation and no AD and no LOB   Pt supine in bed with bed alarm activated, 2 bed rails up, call light within reach and 4Ps assessed.   Therapy/Group: Individual Therapy  Velia Meyer, OTD, OTR/L 06/22/2023, 4:01 PM

## 2023-06-22 NOTE — Progress Notes (Signed)
 Physical Therapy Session Note  Patient Details  Name: CHIEF WALKUP MRN: [id number] Date of Birth: 23-Sep-1945  Today's Date: 06/22/2023 PT Individual Time: 4098-1191 PT Individual Time Calculation (min): 45 min   Short Term Goals: Week 1:  PT Short Term Goal 1 (Week 1): STG=LTG d/t ELOS  Skilled Therapeutic Interventions/Progress Updates:      Therapy Documentation Precautions:  Precautions Precautions: Sternal, Fall, ICD/Pacemaker Precaution Booklet Issued: Yes (comment) Recall of Precautions/Restrictions: Intact Precaution/Restrictions Comments: able to recall precautions Restrictions Weight Bearing Restrictions Per Provider Order: Yes RUE Weight Bearing Per Provider Order: Non weight bearing LUE Weight Bearing Per Provider Order: Non weight bearing Other Position/Activity Restrictions: sternal precautions/ICD/pacemaker precautions  Pt received in care of nursing, PT present for direct hand off following toileting. Pt without reports of pain and supervision with increased time and use of adaptive equipment (reacher) with lower body dressing (pants, socks, shoes).   PT educated pt on energy conservation and RPE when planning day after discharge. Pt reports he wants to be able to trim toe nails and pick items off floor (ex: golf ball), therefore practice body weight squats 3 x 8 to improve LE strength to work on patient's goals.   Pt left seated at bedside, with all needs in reach, and alarm on.     Therapy/Group: Individual Therapy  Truitt Leep Truitt Leep PT, DPT  06/22/2023, 12:04 PM

## 2023-06-22 NOTE — Progress Notes (Signed)
 Occupational Therapy Session Note  Patient Details  Name: KAUSHIK MAUL MRN: [id number] Date of Birth: September 20, 1945  Today's Date: 06/22/2023 OT Individual Time: 1300-1345 OT Individual Time Calculation (min): 45 min    Short Term Goals: Week 1:  OT Short Term Goal 1 (Week 1): STG=LTG d/t ELOS  Skilled Therapeutic Interventions/Progress Updates:    1:1 Pt received in the bed resting. PT ambulated to the gym without AD with min guard to close supervision at a slow pace. In the gym practiced stepping over 5 inch hurdles, and side stepping through agility ladder (both ways), walking backwards etc. Also performed the OTAGA balance exercises C to continue to challenge his balance and his cardiopulmonary endurance including, sit to stand without hands, standing on one leg, walking in tandem, toe walking, heel walking, walking in a figure 8, mini squats from standing position and stair climbing two flights  (in the hallway) - with min guard to close supervision. Pt required rest breaks throughout session but could continue afterwards.   Pt ambulated back to the room and left sitting up in the recliner to have his lunch with call bell at his side.   Therapy Documentation Precautions:  Precautions Precautions: Sternal, Fall, ICD/Pacemaker Precaution Booklet Issued: Yes (comment) Recall of Precautions/Restrictions: Intact Precaution/Restrictions Comments: able to recall precautions Restrictions Weight Bearing Restrictions Per Provider Order: Yes RUE Weight Bearing Per Provider Order: Non weight bearing LUE Weight Bearing Per Provider Order: Non weight bearing Other Position/Activity Restrictions: sternal precautions/ICD/pacemaker precautions  Pain:  No c/o pain in session   Therapy/Group: Individual Therapy  Roney Mans [facility] 06/22/2023, 2:46 PM

## 2023-06-23 ENCOUNTER — Other Ambulatory Visit: Payer: Self-pay | Admitting: Family Medicine

## 2023-06-23 MED ORDER — VITAMIN D (ERGOCALCIFEROL) 1.25 MG (50000 UNIT) PO CAPS
50000.0000 [IU] | ORAL_CAPSULE | ORAL | Status: DC
  Administered 2023-06-23: 50000 [IU] via ORAL
  Filled 2023-06-23: qty 1

## 2023-06-23 MED ORDER — ACETAMINOPHEN 325 MG PO TABS
325.0000 mg | ORAL_TABLET | ORAL | Status: AC | PRN
Start: 2023-06-23 — End: ?

## 2023-06-23 NOTE — Progress Notes (Signed)
 Patient ID: [name] [name], male   DOB: Jun 15, 1945, 78 y.o.   MRN: [id number]  Met with pt to let know team and MD feel he will be ready for discharge on Wed. He has all equipment and needs cardiac rehab and order has been placed on acute for this. Plan for discharge Wed.

## 2023-06-23 NOTE — Progress Notes (Signed)
 Occupational Therapy Session Note  Patient Details  Name: [name] [name] MRN: [id number] Date of Birth: [birth year]  Today's Date: 06/23/2023 OT Individual Time: 7829-5621 OT Individual Time Calculation (min): 70 min    Short Term Goals: Week 1:  OT Short Term Goal 1 (Week 1): STG=LTG d/t ELOS  Skilled Therapeutic Interventions/Progress Updates:    Pt resting in bed upon arrival. Pt declined shower this morning but stated he would like to take shower during afternoon session. Pt requested to use toilet. Amb without AD to bathroom. [place]ting with supervision. OT intervention with focus on functional amb wihtout AD, standing balance, BLE therex, bed mobility, activity toleance, and discharge planning to increase pt's independence with BADLs. Amb throughout unit without AD at [facility]. Pt practiced bed mobility on std bed at supervision level. Sit<>stand from various heights. BLE therex on NuStep 5 mins x 2 (level 5 and 6)-HR 111. Educated on energy conservation and scheduling when at home. Pt returned to room and sat in w/c. All needs within reach. Belt alarm activated.   Therapy Documentation Precautions:  Precautions Precautions: Sternal, Fall, ICD/Pacemaker Precaution Booklet Issued: Yes (comment) Recall of Precautions/Restrictions: Intact Precaution/Restrictions Comments: able to recall precautions Restrictions Weight Bearing Restrictions Per Provider Order: Yes RUE Weight Bearing Per Provider Order: Non weight bearing LUE Weight Bearing Per Provider Order: Non weight bearing Other Position/Activity Restrictions: sternal precautions/ICD/pacemaker precautions Pain: Pt denies pain this morning.  Therapy/Group: Individual Therapy  [name] 06/23/2023, 12:03 PM

## 2023-06-23 NOTE — Progress Notes (Signed)
 Physical Therapy Session Note  Patient Details  Name: [name] [name] MRN: [id number] Date of Birth: 09/17/1945  Today's Date: 06/23/2023 PT Individual Time: 0850-0948 PT Individual Time Calculation (min): 58 min   Short Term Goals: Week 1:  PT Short Term Goal 1 (Week 1): STG=LTG d/t ELOS  Skilled Therapeutic Interventions/Progress Updates:      Therapy Documentation Precautions:  Precautions Precautions: Sternal, Fall, ICD/Pacemaker Precaution Booklet Issued: Yes (comment) Recall of Precautions/Restrictions: Intact Precaution/Restrictions Comments: able to recall precautions Restrictions Weight Bearing Restrictions Per Provider Order: Yes RUE Weight Bearing Per Provider Order: Non weight bearing LUE Weight Bearing Per Provider Order: Non weight bearing Other Position/Activity Restrictions: sternal precautions/ICD/pacemaker precautions  Pt agreeable to PT session with emphasis on global strength and balance training to prepare for patient's long term goals. Pt without reports of pain, supervision with supine<>sit from flat surface with log roll technique and gait ~150' main gym with rollator. Pt participated in circuit x 5 of sit<>stand from 16" chair and alternating forward lunges x 10. Pt able to progress from lunges to deep wide base squats and pt able to pick up object from step and eventually floor x 8. Pt with long term goal to pick items off floor and making excellent progress. Pt ambulated to room, left semi-reclined with all needs in reach and alarm on.     Therapy/Group: Individual Therapy  Truitt Leep Truitt Leep PT, DPT  06/23/2023, 12:33 PM

## 2023-06-23 NOTE — Plan of Care (Signed)
  Problem: Consults Goal: RH GENERAL PATIENT EDUCATION Description: See Patient Education module for education specifics. 06/23/2023 0707 by Randell Patient, RN Outcome: Progressing 06/23/2023 0707 by Randell Patient, RN Outcome: Progressing   Problem: RH SKIN INTEGRITY Goal: RH STG SKIN FREE OF INFECTION/BREAKDOWN Description: STG Manage skin free from infection/breakdown with  MOD I Assistance 06/23/2023 0707 by Randell Patient, RN Outcome: Progressing 06/23/2023 0707 by Randell Patient, RN Outcome: Progressing   Problem: RH PAIN MANAGEMENT Goal: RH STG PAIN MANAGED AT OR BELOW [name] PAIN GOAL Description: <4 w/ prns 06/23/2023 0707 by Randell Patient, RN Outcome: Progressing 06/23/2023 0707 by Randell Patient, RN Outcome: Progressing   Problem: RH KNOWLEDGE DEFICIT GENERAL Goal: RH STG INCREASE KNOWLEDGE OF SELF CARE AFTER HOSPITALIZATION Description: Manage increase  knowledge of self care after hospitalization with  MOD I Assistance using educational materials provided 06/23/2023 0707 by Randell Patient, RN Outcome: Progressing 06/23/2023 0707 by Randell Patient, RN Outcome: Progressing

## 2023-06-23 NOTE — Progress Notes (Signed)
 Occupational Therapy Session Note  Patient Details  Name: [name] [name] MRN: [id number] Date of Birth: December 20, 1945  Today's Date: 06/23/2023 OT Individual Time: 1330-1430 OT Individual Time Calculation (min): 60 min    Short Term Goals: Week 1:  OT Short Term Goal 1 (Week 1): STG=LTG d/t ELOS  Skilled Therapeutic Interventions/Progress Updates:    OT intervention with focus on funcitonal amb without AD, bathing at shower level, dressing with sit<>stand, standing balance, energy conservation, and discharge planning in preparation for discharge on 3/19. All amb in room without AD at supervision level. Bathing with sit<>stand from TTB in shower with supervision using LHS. Dressing with sit<>stand with supervision using reacher to assist with threading pants. Reviewed energy conservation strategies and recommendations. Recommended establishing schedule at home while allowing for appropriate rest breaks after periods of exertion. Pt returned to bed at end of session. Pt remarked how tiring tasks like bathing/dressing could be. Pt remained in bed with all needs within reach. Bed alarm actiated.   Therapy Documentation Precautions:  Precautions Precautions: Sternal, Fall, ICD/Pacemaker Precaution Booklet Issued: Yes (comment) Recall of Precautions/Restrictions: Intact Precaution/Restrictions Comments: able to recall precautions Restrictions Weight Bearing Restrictions Per Provider Order: Yes RUE Weight Bearing Per Provider Order: Non weight bearing LUE Weight Bearing Per Provider Order: Non weight bearing Other Position/Activity Restrictions: sternal precautions/ICD/pacemaker precautions   Pain:  Pt denies pain this afternoon   Therapy/Group: Individual Therapy  [name] 06/23/2023, 2:35 PM

## 2023-06-23 NOTE — Progress Notes (Signed)
 PROGRESS NOTE   Subjective/Complaints:  Pt reports LBM this AM Has questions about colonscopy and outpt therapy f/u and who schedules it.   Also d/w pharmacy- pt had Vit D level of 18- asking to start Treatment.    ROS:  Pt denies SOB, abd pain, CP, N/V/C/D, and vision changes   Objective:   No results found. No results for input(s): "WBC", "HGB", "HCT", "PLT" in the last 72 hours.  No results for input(s): "NA", "K", "CL", "CO2", "GLUCOSE", "BUN", "CREATININE", "CALCIUM" in the last 72 hours.   Intake/Output Summary (Last 24 hours) at 06/23/2023 0834 Last data filed at 06/23/2023 0824 Gross per 24 hour  Intake 360 ml  Output --  Net 360 ml        Physical Exam: Vital Signs Blood pressure 105/64, pulse 78, temperature 98.5 F (36.9 C), temperature source Oral, resp. rate 18, height 5\' 10"  (1.778 m), weight 84.3 kg, SpO2 97%.     General: awake, alert, appropriate, sitting on toilet having BM; NAD HENT: conjugate gaze; oropharynx moist CV: regular rate and rhythm; no JVD Pulmonary: CTA B/L; no W/R/R- good air movement GI: soft, NT, ND, (+)BS Psychiatric: appropriate - interactive Neurological: Ox3  4+ /5 in BUE and BLE, tone is normal coordiation is normal Speech without dysarthria or aphasia     Skin: Sternal incision and abd incision and pacemaker incision look good   Assessment/Plan: 1. Functional deficits which require 3+ hours per day of interdisciplinary therapy in a comprehensive inpatient rehab setting. Physiatrist is providing close team supervision and 24 hour management of active medical problems listed below. Physiatrist and rehab team continue to assess barriers to discharge/monitor patient progress toward functional and medical goals  Care Tool:  Bathing    Body parts bathed by patient: Right arm, Left arm, Chest, Abdomen, Front perineal area, Buttocks, Right upper leg, Left upper  leg, Face   Body parts bathed by helper: Right lower leg, Left lower leg     Bathing assist Assist Level: Minimal Assistance - Patient > 75%     Upper Body Dressing/Undressing Upper body dressing   What is the patient wearing?: Button up shirt    Upper body assist Assist Level: Minimal Assistance - Patient > 75%    Lower Body Dressing/Undressing Lower body dressing      What is the patient wearing?: Pants, Incontinence brief     Lower body assist Assist for lower body dressing: Minimal Assistance - Patient > 75%     Toileting Toileting    Toileting assist Assist for toileting: Minimal Assistance - Patient > 75%     Transfers Chair/bed transfer  Transfers assist     Chair/bed transfer assist level: Contact Guard/Touching assist     Locomotion Ambulation   Ambulation assist      Assist level: Contact Guard/Touching assist Assistive device: No Device Max distance: 150   Walk 10 feet activity   Assist     Assist level: Contact Guard/Touching assist Assistive device: No Device   Walk 50 feet activity   Assist    Assist level: Contact Guard/Touching assist Assistive device: No Device    Walk 150 feet activity  Assist    Assist level: Contact Guard/Touching assist Assistive device: No Device    Walk 10 feet on uneven surface  activity   Assist     Assist level: Contact Guard/Touching assist Assistive device: Other (comment) (no device)   Wheelchair     Assist Is the patient using a wheelchair?: Yes Type of Wheelchair: Manual    Wheelchair assist level: Dependent - Patient 0%      Wheelchair 50 feet with 2 turns activity    Assist        Assist Level: Dependent - Patient 0%   Wheelchair 150 feet activity     Assist      Assist Level: Dependent - Patient 0%   Blood pressure 105/64, pulse 78, temperature 98.5 F (36.9 C), temperature source Oral, resp. rate 18, height 5\' 10"  (1.778 m), weight 84.3 kg,  SpO2 97%.   Medical Problem List and Plan: 1. Functional deficits secondary to debility after MVR/TVR/maze/GI bleed/hemorrhagic shock.  Sternal precautions             -patient may shower since PICC out             -ELOS/Goals: 5-7 days S             Con't CIR PT and OT  -will d/c Wednesday 3/19 2.  Antithrombotics: -DVT/anticoagulation:  Pharmaceutical: Eliquis             -antiplatelet therapy: Aspirin 81 mg daily 3. Pain Management: Tylenol as needed  3/14- denies pain 4. Mood/Behavior/Sleep: Provide emotional support             -antipsychotic agents: N/A 5. Neuropsych/cognition: This patient is capable of making decisions on his own behalf. 6. Skin/Wound Care: Routine skin checks  3/13- has groin maceration that he came with- due to prior male purewick- will do skin protection treatment 7. Fluids/Electrolytes/Nutrition: Routine in and outs with follow-up chemistries 8.  GI bleed.  Status post colonoscopy 06/14/2023 results showed diverticula on right and left colon patchy inflammation multiple sessile polyps and linear ulcerations consistent with ischemic colitis, 1 large lesion distal transverse colon biopsied (removal as an outpatient).  Biopsy pending.  continue Protonix 40 mg twice daily.  Gastroenterology services have signed off   3/14- His Hb is stable yesterday  3/17- will need f/u with GI for colonoscopy after d/c.  9.  Acute blood loss anemia.  Follow-up CBC      Latest Ref Rng & Units 06/19/2023    5:45 AM 06/18/2023    5:00 AM 06/17/2023    3:59 AM  CBC  WBC 4.0 - 10.5 K/uL 8.2  8.7  9.6   Hemoglobin 13.0 - 17.0 g/dL 60.4  54.0  98.1   Hematocrit 39.0 - 52.0 % 33.6  33.7  32.2   Platelets 150 - 400 K/uL 257  235  200     10.  Leukocytosis resolved.  Empiric Zosyn discontinued, check WBC tomorrow   3/14- WBC 8.2 11.  A-fib with RVR/DDCV/pacemaker 2/25.  Cardiac rate controlled.  Amiodarone 400 mg twice daily, Lanoxin 0.125 mg daily.  Cardiac rate controlled. Check  magnesium level tomorrow.   3/13- Mg level 1.9- K+ 3.9-  12. Hx of prostate CA  3/13- wil get him bladder pads for bladder incontinence since prostate CA 13. Vitd D deficiency  3/17- was 18- will start 50k units q week   I spent a total of  37  minutes on total care today- >50% coordination of care-  due to  D/w therapies about d/c date, SW, and pharmacy about Vit D   LOS: 5 days A FACE TO FACE EVALUATION WAS PERFORMED  Karalyn Kadel 06/23/2023, 8:34 AM

## 2023-06-23 NOTE — Plan of Care (Signed)
  Problem: RH SKIN INTEGRITY Goal: RH STG SKIN FREE OF INFECTION/BREAKDOWN Description: STG Manage skin free from infection/breakdown with  MOD I Assistance Outcome: Progressing   Problem: Consults Goal: RH GENERAL PATIENT EDUCATION Description: See Patient Education module for education specifics. Outcome: Progressing   Problem: RH SAFETY Goal: RH STG ADHERE TO SAFETY PRECAUTIONS W/ASSISTANCE/DEVICE Description: STG Adhere to Safety Precautions With  mod I Assistance/Device. Outcome: Progressing   Problem: RH PAIN MANAGEMENT Goal: RH STG PAIN MANAGED AT OR BELOW [name] PAIN GOAL Description: <4 w/ prns Outcome: Progressing   Problem: RH KNOWLEDGE DEFICIT GENERAL Goal: RH STG INCREASE KNOWLEDGE OF SELF CARE AFTER HOSPITALIZATION Description: Manage increase  knowledge of self care after hospitalization with  MOD I Assistance using educational materials provided Outcome: Progressing

## 2023-06-23 NOTE — Progress Notes (Signed)
 Met with patient to review current situation, team conferences and plan of care. Reviewed sternal precautions, medications and dietary recommendations. Reviwed DAPT. Marland Kitchen Continue to follow along to provide educational needs to facilitate preparation for discharge.

## 2023-06-24 ENCOUNTER — Other Ambulatory Visit (HOSPITAL_COMMUNITY): Payer: Self-pay

## 2023-06-24 ENCOUNTER — Telehealth: Payer: Self-pay | Admitting: Internal Medicine

## 2023-06-24 LAB — BASIC METABOLIC PANEL
Anion gap: 7 (ref 5–15)
BUN: 13 mg/dL (ref 8–23)
CO2: 23 mmol/L (ref 22–32)
Calcium: 7.9 mg/dL — ABNORMAL LOW (ref 8.9–10.3)
Chloride: 109 mmol/L (ref 98–111)
Creatinine, Ser: 0.99 mg/dL (ref 0.61–1.24)
GFR, Estimated: >60 mL/min (ref >60)
Glucose, Bld: 91 mg/dL (ref 70–99)
Potassium: 3.8 mmol/L (ref 3.5–5.1)
Sodium: 139 mmol/L (ref 135–145)

## 2023-06-24 LAB — CBC WITH DIFFERENTIAL/PLATELET
Abs Immature Granulocytes: 0.01 10*3/uL (ref 0.00–0.07)
Basophils Absolute: 0 10*3/uL (ref 0.0–0.1)
Basophils Relative: 1 %
Eosinophils Absolute: 0.2 10*3/uL (ref 0.0–0.5)
Eosinophils Relative: 4 %
HCT: 32.4 % — ABNORMAL LOW (ref 39.0–52.0)
Hemoglobin: 10.4 g/dL — ABNORMAL LOW (ref 13.0–17.0)
Immature Granulocytes: 0 %
Lymphocytes Relative: 20 %
Lymphs Abs: 1 10*3/uL (ref 0.7–4.0)
MCH: 28.9 pg (ref 26.0–34.0)
MCHC: 32.1 g/dL (ref 30.0–36.0)
MCV: 90 fL (ref 80.0–100.0)
Monocytes Absolute: 0.6 10*3/uL (ref 0.1–1.0)
Monocytes Relative: 14 %
Neutro Abs: 2.9 10*3/uL (ref 1.7–7.7)
Neutrophils Relative %: 61 %
Platelets: 281 10*3/uL (ref 150–400)
RBC: 3.6 MIL/uL — ABNORMAL LOW (ref 4.22–5.81)
RDW: 14.6 % (ref 11.5–15.5)
WBC: 4.7 10*3/uL (ref 4.0–10.5)
nRBC: 0 % (ref 0.0–0.2)

## 2023-06-24 MED ORDER — FE FUM-VIT C-VIT B12-FA 460-60-0.01-1 MG PO CAPS
1.0000 | ORAL_CAPSULE | Freq: Every day | ORAL | 0 refills | Status: DC
Start: 2023-06-24 — End: 2024-01-13
  Filled 2023-06-24: qty 30, 30d supply, fill #0

## 2023-06-24 MED ORDER — VITAMIN D (ERGOCALCIFEROL) 1.25 MG (50000 UNIT) PO CAPS
50000.0000 [IU] | ORAL_CAPSULE | ORAL | 0 refills | Status: DC
Start: 2023-06-30 — End: 2024-01-13
  Filled 2023-06-24: qty 5, 35d supply, fill #0

## 2023-06-24 MED ORDER — AMIODARONE HCL 200 MG PO TABS
200.0000 mg | ORAL_TABLET | Freq: Every day | ORAL | 0 refills | Status: DC
Start: 2023-06-25 — End: 2023-07-25
  Filled 2023-06-24: qty 30, 30d supply, fill #0

## 2023-06-24 MED ORDER — ACETAMINOPHEN 325 MG PO TABS
325.0000 mg | ORAL_TABLET | ORAL | Status: DC | PRN
Start: 2023-06-24 — End: 2023-06-24

## 2023-06-24 MED ORDER — PANTOPRAZOLE SODIUM 40 MG PO TBEC
40.0000 mg | DELAYED_RELEASE_TABLET | Freq: Two times a day (BID) | ORAL | 0 refills | Status: DC
  Filled 2023-06-24: qty 60, 30d supply, fill #0

## 2023-06-24 MED ORDER — APIXABAN 2.5 MG PO TABS
2.5000 mg | ORAL_TABLET | Freq: Two times a day (BID) | ORAL | 0 refills | Status: DC
Start: 2023-06-24 — End: 2023-07-25
  Filled 2023-06-24: qty 60, 30d supply, fill #0

## 2023-06-24 MED ORDER — ATORVASTATIN CALCIUM 20 MG PO TABS
20.0000 mg | ORAL_TABLET | Freq: Every day | ORAL | 0 refills | Status: DC
  Filled 2023-06-24: qty 30, 30d supply, fill #0

## 2023-06-24 MED ORDER — DIGOXIN 125 MCG PO TABS
0.1250 mg | ORAL_TABLET | Freq: Every day | ORAL | 0 refills | Status: DC
  Filled 2023-06-24: qty 30, 30d supply, fill #0

## 2023-06-24 NOTE — Progress Notes (Signed)
 Inpatient Rehabilitation Care Coordinator Discharge Note   Patient Details  Name: [name] [name] MRN: [id number] Date of Birth: 01-Nov-1945   Discharge location: HOME WITH WIFE WHO CAN BE THERE BUT NOT ASSIST  Length of Stay: 6 DAYS  Discharge activity level: INDEPENDENT WITH DEVICE  Home/community participation: ACTIVE  Patient response IO:NGEXBM Literacy - How often do you need to have someone help you when you read instructions, pamphlets, or other written material from your doctor or pharmacy?: Never  Patient response WU:XLKGMW Isolation - How often do you feel lonely or isolated from those around you?: Never  Services provided included: MD, RD, PT, OT, RN, CM, Pharmacy, SW  Financial Services:  Financial Services Utilized: [employer] MEDICARE  Choices offered to/list presented to: PT  Follow-up services arranged:  Outpatient (CARDIAC REHAB)    Outpatient Servicies: CARDIAC REHAB REFERRAL MADE WHILE ON ACUTE THEY WILL CALL TO ARRANGE FOLLOW UP APPOINTMENTS  HAS ALL DME FROM WIFE    Patient response to transportation need: Is the patient able to respond to transportation needs?: Yes In the past 12 months, has lack of transportation kept you from medical appointments or from getting medications?: No In the past 12 months, has lack of transportation kept you from meetings, work, or from getting things needed for daily living?: No   Patient/Family verbalized understanding of follow-up arrangements:  Yes  Individual responsible for coordination of the follow-up plan: SELF AND WIFE DARLENE [phone]  Confirmed correct DME delivered: Lucy Chris 06/24/2023    Comments (or additional information): PT DID WELL AND REACHED GOALS QUICKLY,WANTS TO GO HOME DAY EARLY AND MD AGREES    Lucy Chris

## 2023-06-24 NOTE — Progress Notes (Unsigned)
 [address]       [place] [address]             [phone]       HPI: [name] is an 78 year old man with a past medical history of atrial fibrillation (s/p ablation 2012), HLD, HTN, sinus node dysfunction, CKD stage II, TIA 2011, mitral regurgitation, and tricuspid regurgitation. The patient returns for routine postoperative follow-up having undergone mitral valve repair using a 32mm Medtronic stimulus semirigid annuloplasty band, tricuspid valve repair using a 34mm Edwards MC 3 tricuspid annuloplasty ring, full maze procedure and left atrial closure with a 50 mm Medtronic clip by Dr. Leafy Ro on 05/27/23. The patient had a complicated postoperative recovery while in the hospital notable for tachy-brady syndrome requiring DCCV and permanent pacemaker placement on 02/25, acute GI bleed likely from ischemic colitis on colonoscopy as well as a large tubular adenoma on biopsy, he was given zosyn for leukocytosis due to possible diverticulitis, he also developed an AKI that resolved with time. Electrophysiology followed the patient closely for recurrent atrial fibrillation. He was discharged in stable condition to CIR on 03/12, he was then discharged from CIR on 03/18. He was seen by cardiology 03/31 and reported fatigue and had swelling on exam. He was started on Lasix 20mg  BID for 3 days then PRN. SGLT2 was held due to possible UTI with urinary symptoms but no UA was ordered. Heart rate was in the 90s but overall controlled persistent atrial fibrillation. Amiodarone and Digoxin were continued.  Since hospital discharge the patient reports he is getting better every day. He denies pain since surgery other than mild pain with cough or sneeze and denies using narcotic pain medication. He admits to some shortness of breath with a lot of walking but denies dizziness and LOC. He does admit to swelling in his legs and feet but does not believe this is more than it was prior to surgery and  cardiology prescribed him Lasix 20mg  BID for 3 days and then 20mg  PRN yesterday. He does also admit to walking about 100 yards every 2-3 hours.   No current facility-administered medications for this visit.   Current Outpatient Medications  Medication Sig Dispense Refill   acetaminophen (TYLENOL) 325 MG tablet Take 1-2 tablets (325-650 mg total) by mouth every 4 (four) hours as needed for mild pain (pain score 1-3).     [START ON 06/25/2023] amiodarone (PACERONE) 200 MG tablet Take 1 tablet (200 mg total) by mouth daily. 30 tablet 0   apixaban (ELIQUIS) 2.5 MG TABS tablet Take 1 tablet (2.5 mg total) by mouth 2 (two) times daily. 60 tablet 0   atorvastatin (LIPITOR) 20 MG tablet Take 1 tablet (20 mg total) by mouth daily. 30 tablet 0   digoxin (LANOXIN) 0.125 MG tablet Take 1 tablet (0.125 mg total) by mouth daily. 30 tablet 0   Fe Fum-Vit C-Vit B12-FA (TRIGELS-F FORTE) CAPS capsule Take 1 capsule by mouth daily. 30 capsule 0   pantoprazole (PROTONIX) 40 MG tablet Take 1 tablet (40 mg total) by mouth 2 (two) times daily. 60 tablet 0   [START ON 06/30/2023] Vitamin D, Ergocalciferol, (DRISDOL) 1.25 MG (50000 UNIT) CAPS capsule Take 1 capsule (50,000 Units total) by mouth every 7 (seven) days. 5 capsule 0   Facility-Administered Medications Ordered in Other Visits  Medication Dose Route Frequency Provider Last Rate Last Admin   acetaminophen (TYLENOL) tablet 325-650 mg  325-650 mg Oral Q4H PRN Angiulli,  Mcarthur Rossetti, PA-C       [START ON 06/25/2023] amiodarone (PACERONE) tablet 200 mg  200 mg Oral Daily Angiulli, Mcarthur Rossetti, PA-C       apixaban Everlene Balls) tablet 2.5 mg  2.5 mg Oral BID Charlton Amor, PA-C   2.5 mg at 06/23/23 1951   aspirin EC tablet 81 mg  81 mg Oral Daily Lovorn, Megan, MD   81 mg at 06/23/23 0846   atorvastatin (LIPITOR) tablet 20 mg  20 mg Oral Daily Lovorn, Megan, MD   20 mg at 06/23/23 0846   digoxin (LANOXIN) tablet 0.125 mg  0.125 mg Oral Daily Lovorn, Megan, MD   0.125 mg at  06/23/23 0846   Fe Fum-Vit C-Vit B12-FA (TRIGELS-F FORTE) capsule 1 capsule  1 capsule Oral Daily Lovorn, Megan, MD   1 capsule at 06/23/23 1610   Gerhardt's butt cream   Topical PRN Angiulli, Mcarthur Rossetti, PA-C       nystatin (MYCOSTATIN/NYSTOP) topical powder   Topical BID Charlton Amor, PA-C   Given at 06/22/23 2032   pantoprazole (PROTONIX) EC tablet 40 mg  40 mg Oral BID Charlton Amor, PA-C   40 mg at 06/23/23 1951   sodium chloride (OCEAN) 0.65 % nasal spray 1 spray  1 spray Each Nare PRN Angiulli, Mcarthur Rossetti, PA-C       sodium chloride flush (NS) 0.9 % injection 10-40 mL  10-40 mL Intracatheter PRN Lovorn, Megan, MD       Vitamin D (Ergocalciferol) (DRISDOL) 1.25 MG (50000 UNIT) capsule 50,000 Units  50,000 Units Oral Q7 days Genice Rouge, MD   50,000 Units at 06/23/23 9604   Vitals: Today's Vitals   07/08/23 1356  BP: 128/82  Pulse: 87  Resp: 18  SpO2: 97%  Weight: 198 lb (89.8 kg)  Height: 5\' 10"  (1.778 m)   Body mass index is 28.41 kg/m.   Physical Exam: General: Alert and oriented, no acute distress Neuro: Grossly intact CV: Irregularly irregular rhythm, normal rate, no murmur Pulm: Clear to auscultation bilaterally GI: +BS, nontender, no distension Extremities: 1+ edema BLE Wound: Clean and dry without sign of infection  Diagnostic Tests: CXR after the appointment with a small left sided pleural effusion  Impression/Plan: S/P MVRepair and TVRepair: The patient is overall doing well from surgery. He denies pain and has not needed narcotic pain medication. He denies shortness of breath unless he has done a lot of walking but states this is improving. He does have a small left pleural effusion on CXR but was started on Lasix by cardiology yesterday so hopefully this will resolve with Lasix and time. The patient has persistent atrial fibrillation with a controlled rate. He does not have palpitations but does feel his heart beat at night and is able to notice he is in  atrial fibrillation. He does not have any symptoms such as shortness of breath, dizziness or LOC related to atrial fibrillation. He remains on Amiodarone and Digoxin as well as Eliquis, cardiology will start Lopressor when able. He does admit to a metallic taste in his mouth when eating, this should improve with time. I will check his labs tomorrow and he can discontinue his iron supplement if H/H have improved. Hopefully cardiology will be able to discontinue Amiodarone soon as that may also be contributing. He does admit to urinary burning and feels like he has a bladder infection, he is getting labs from cardiology and a UA in his PCP office tomorrow. He is ambulating  well and continues to improve since discharge from SNF. His incisions are healing well without sign of infection. I will not make any medication changes at this time. I cleared him to drive and for cardiac rehab. We reviewed endocarditis prophylaxis and discussed continued sternal precautions for 3 months. He did ask about when he can have a colonoscopy for the large polyp noted on inpatient colonoscopy. I told him he is likely cleared from our perspective at this point but to discuss this with Dr. Leafy Ro when he sees him in the office as well as cardiology to ensure it is okay to hold his Eliquis if needed. Plan to have the patient return to the clinic with a CXR to see Dr. Leafy Ro in 2 weeks.    Jenny Reichmann, PA-C [facility] [phone]

## 2023-06-24 NOTE — Patient Care Conference (Signed)
 Inpatient RehabilitationTeam Conference and Plan of Care Update Date: 06/23/2023   Time: 0809 am    Patient Name: [name] [name]      Medical Record Number: [id number]  Date of Birth: September 30, 1945 Sex: Male         Room/Bed: 4M11C/4M11C-01 Payor Info: Payor: [employer]UE [employer]UE [employer] MEDICARE / Plan: BCBS MEDICARE / Product Type: *No Product type* /    Admit Date/Time:  06/18/2023  3:27 PM  Primary Diagnosis:  Debility  Hospital Problems: Principal Problem:   Debility    Expected Discharge Date: Expected Discharge Date: 06/24/23  Team Members Present: Physician leading conference: Dr. Genice Rouge Social Worker Present: Dossie Der, LCSW Nurse Present: Konrad Dolores, RN PT Present: Truitt Leep, PT OT Present: Velia Meyer, OT     Current Status/Progress Goal Weekly Team Focus  Bowel/Bladder   continent with bowel and bladder   no accidents   bladder and bowel training    Swallow/Nutrition/ Hydration               ADL's   mod I/supervsion overall; fatigues quickly   mod I overall   discharge planning, educaiton    Mobility   supervision bed, transfers, gait >200' rollator, CGA steps x 1 HR   Mod I  activity tolerance; picking objects off floor to increase independence    Communication                Safety/Cognition/ Behavioral Observations               Pain   denies pain   pain free   coughing and deep breathing exercises    Skin   clean, dry and intact   free from infection  wound care      Discharge Planning:  HOme with wife who can provide supervision only due to her own health issues. Doing well and ready to go home. Has all DME and Cardiac rehab set up for OP   Team Discussion: Patient was admitted with  debility post MVR/TVR/mazeGI bleed/ Hemorrhagic shock.   Patient on target to meet rehab goals: yes, patient on target with mod I  goals set at discharge.  *See Care Plan and progress notes for long and short-term goals.    Revisions to Treatment Plan:  N/a   Teaching Needs: Safety, medications, dietary recommendations, toileting, transfers, sternal precaution education, etc  Current Barriers to Discharge: Decreased caregiver support  Possible Resolutions to Barriers: Family Education Outpatient follow up      Medical Summary Current Status: AVR/TVR and MAZE procedure- sutures still in abdomen- needs colonsocopy for large polyp- skin doing well otherwise, no pain  Barriers to Discharge: Cardiac Complications;Complicated Wound;Weight bearing restrictions  Barriers to Discharge Comments: pt ready for d/c today- functionally- also has a few memory issues it appears- asking ot leave early, but then appeared to forget? Possible Resolutions to [employer]: d/c today- will arrange outpt therapies- as well as colonsocopy for large polyp that needs to be removed   Continued Need for Acute Rehabilitation Level of Care: The patient requires daily medical management by a physician with specialized training in physical medicine and rehabilitation for the following reasons: Direction of a multidisciplinary physical rehabilitation program to maximize functional independence : Yes Medical management of patient stability for increased activity during participation in an intensive rehabilitation regime.: Yes Analysis of laboratory values and/or radiology reports with any subsequent need for medication adjustment and/or medical intervention. : Yes   I attest that I  was present, lead the team conference, and concur with the assessment and plan of the team.   Gwenyth Allegra 06/24/2023, 9:49 AM

## 2023-06-24 NOTE — Progress Notes (Signed)
 Occupational Therapy Session Note  Patient Details  Name: [name] [name] MRN: [id number] Date of Birth: 08-12-1945  Today's Date: 06/24/2023 OT Individual Time: 8295-6213 OT Individual Time Calculation (min): 30 min  and Today's Date: 06/24/2023 OT Missed Time: 15 Minutes Missed Time Reason: Other (comment)   Short Term Goals: Week 1:  OT Short Term Goal 1 (Week 1): STG=LTG d/t ELOS  Skilled Therapeutic Interventions/Progress Updates:    [name] d/c moved up and pt will be discharging home today. OT intervention with focus on review of home safety recommendations, energy conservation strategies. And discharge planning to prepare for discharge later this morning. Pt verbalized understanding of all recommendations. Pt pleased with progress and ready to go home later today. Pt is mod I for all BADLs with AD and AE. All functional transfers and amb with RW at mod I. Pt remained in bed with all needs within reach. Bed alarm activated.   Therapy Documentation Precautions:  Precautions Precautions: Sternal, Fall, ICD/Pacemaker Precaution Booklet Issued: Yes (comment) Recall of Precautions/Restrictions: Intact Precaution/Restrictions Comments: able to recall precautions Restrictions Weight Bearing Restrictions Per Provider Order: Yes RUE Weight Bearing Per Provider Order: Non weight bearing LUE Weight Bearing Per Provider Order: Non weight bearing Other Position/Activity Restrictions: sternal precautions/ICD/pacemaker precautions General: General OT Amount of Missed Time: 15 Minutes Pain: Pt denies pain   Therapy/Group: Individual Therapy  [name] 06/24/2023, 10:48 AM

## 2023-06-24 NOTE — Telephone Encounter (Signed)
 Returned call to daughter and advised to keep appointment.

## 2023-06-24 NOTE — Progress Notes (Signed)
 Discharge instructions provided by D. Angiulli, PA. TOC provided medications. Patient safely discharged.   Tilden Dome, LPN

## 2023-06-24 NOTE — Telephone Encounter (Signed)
 Requested medications are due for refill today.  unsure  Requested medications are on the active medications list.  yes  Last refill. 09/04/2022 #120 0 rf  Future visit scheduled.   no  Notes to clinic.  Refill not delegated. Rx was d/c's 06/24/2023.    Requested Prescriptions  Pending Prescriptions Disp Refills   traMADol (ULTRAM) 50 MG tablet [Pharmacy Med Name: traMADol HCl 50 MG Oral Tablet] 120 tablet 0    Sig: TAKE 1 TABLET BY MOUTH EVERY 6 HOURS AS NEEDED     Not Delegated - Analgesics:  Opioid Agonists Failed - 06/24/2023  4:27 PM      Failed - This refill cannot be delegated      Failed - Urine Drug Screen completed in last 360 days      Failed - Valid encounter within last 3 months    Recent Outpatient Visits           1 year ago Atrial fibrillation, unspecified type (HCC)   [facility] [name], MD   2 years ago Right sided sciatica   [facility] [name], MD   2 years ago Atrial fibrillation, unspecified type [facility])   [facility] [name], [name], MD   4 years ago [place] vision, bilateral   [facility] [name], [name], MD   5 years ago Routine general medical examination at a health care facility   [facility] [name], [name], MD       Future Appointments             In 1 week Louanne Skye, Devoria Albe., NP [facility] at [facility]   In 3 months [name], Charlies Constable, MD [facility] at [facility]

## 2023-06-24 NOTE — Progress Notes (Signed)
 Patient ID: [name] [name], male   DOB: Aug 14, 1945, 78 y.o.   MRN: [id number] Pt told by night RN leaving today and now insisting on discharging. MD ok with this.

## 2023-06-24 NOTE — Telephone Encounter (Signed)
 Daughter calling to see if patient needs to make appt with Dr. [name] for hospital f/u or is appt with Alden Server sufficient enough. Please advise

## 2023-06-24 NOTE — Progress Notes (Signed)
 PROGRESS NOTE   Subjective/Complaints:  Pt reports has all equipment at home- doesn't need anything Was told, by pt, and night nurse, that d/c day was today.  Pt insistent to leave today since "was told he could"- explained was scheduled ot leave tomorrow, but insistent leaves today.   Spoke with SW and Therapy- he's reasonable to leave today.    ROS:   Pt denies SOB, abd pain, CP, N/V/C/D, and vision changes   Objective:   No results found. Recent Labs    06/24/23 0511  WBC 4.7  HGB 10.4*  HCT 32.4*  PLT 281    Recent Labs    06/24/23 0511  NA 139  K 3.8  CL 109  CO2 23  GLUCOSE 91  BUN 13  CREATININE 0.99  CALCIUM 7.9*     Intake/Output Summary (Last 24 hours) at 06/24/2023 1610 Last data filed at 06/24/2023 0758 Gross per 24 hour  Intake 480 ml  Output --  Net 480 ml        Physical Exam: Vital Signs Blood pressure 104/77, pulse 78, temperature 97.9 F (36.6 C), temperature source Oral, resp. rate 18, height 5\' 10"  (1.778 m), weight 83.9 kg, SpO2 97%.      General: awake, alert, appropriate, NAD HENT: conjugate gaze; oropharynx moist CV: regular rate; no JVD Pulmonary: CTA B/L; no W/R/R- good air movement GI: soft, NT, ND, (+)BS Psychiatric: appropriate Neurological: Ox3 Skin- sternal incision almost completely healed- MAZE sutures still in place- cards has left in  4+ /5 in BUE and BLE, tone is normal coordiation is normal Speech without dysarthria or aphasia     Skin: Sternal incision and abd incision and pacemaker incision look good   Assessment/Plan: 1. Functional deficits which require 3+ hours per day of interdisciplinary therapy in a comprehensive inpatient rehab setting. Physiatrist is providing close team supervision and 24 hour management of active medical problems listed below. Physiatrist and rehab team continue to assess barriers to discharge/monitor patient  progress toward functional and medical goals  Care Tool:  Bathing    Body parts bathed by patient: Right arm, Left arm, Chest, Abdomen, Front perineal area, Buttocks, Right upper leg, Left upper leg, Face, Right lower leg, Left lower leg   Body parts bathed by helper: Right lower leg, Left lower leg     Bathing assist Assist Level: Supervision/Verbal cueing     Upper Body Dressing/Undressing Upper body dressing   What is the patient wearing?: Button up shirt    Upper body assist Assist Level: Supervision/Verbal cueing    Lower Body Dressing/Undressing Lower body dressing      What is the patient wearing?: Pants, Incontinence brief     Lower body assist Assist for lower body dressing: Supervision/Verbal cueing     Toileting Toileting    Toileting assist Assist for toileting: Supervision/Verbal cueing     Transfers Chair/bed transfer  Transfers assist     Chair/bed transfer assist level: Contact Guard/Touching assist     Locomotion Ambulation   Ambulation assist      Assist level: Contact Guard/Touching assist Assistive device: No Device Max distance: 150   Walk 10 feet activity  Assist     Assist level: Contact Guard/Touching assist Assistive device: No Device   Walk 50 feet activity   Assist    Assist level: Contact Guard/Touching assist Assistive device: No Device    Walk 150 feet activity   Assist    Assist level: Contact Guard/Touching assist Assistive device: No Device    Walk 10 feet on uneven surface  activity   Assist     Assist level: Contact Guard/Touching assist Assistive device: Other (comment) (no device)   Wheelchair     Assist Is the patient using a wheelchair?: Yes Type of Wheelchair: Manual    Wheelchair assist level: Dependent - Patient 0%      Wheelchair 50 feet with 2 turns activity    Assist        Assist Level: Dependent - Patient 0%   Wheelchair 150 feet activity      Assist      Assist Level: Dependent - Patient 0%   Blood pressure 104/77, pulse 78, temperature 97.9 F (36.6 C), temperature source Oral, resp. rate 18, height 5\' 10"  (1.778 m), weight 83.9 kg, SpO2 97%.   Medical Problem List and Plan: 1. Functional deficits secondary to debility after MVR/TVR/maze/GI bleed/hemorrhagic shock.  Sternal precautions             -patient may shower since PICC out             -ELOS/Goals: 5-7 days S             D/c today per pt request- is reasonable per therapy and will leave Black sutures in upper abd in place 2.  Antithrombotics: -DVT/anticoagulation:  Pharmaceutical: Eliquis             -antiplatelet therapy: Aspirin 81 mg daily 3. Pain Management: Tylenol as needed  3/14- denies pain 4. Mood/Behavior/Sleep: Provide emotional support             -antipsychotic agents: N/A 5. Neuropsych/cognition: This patient is capable of making decisions on his own behalf. 6. Skin/Wound Care: Routine skin checks  3/13- has groin maceration that he came with- due to prior male purewick- will do skin protection treatment 7. Fluids/Electrolytes/Nutrition: Routine in and outs with follow-up chemistries 8.  GI bleed.  Status post colonoscopy 06/14/2023 results showed diverticula on right and left colon patchy inflammation multiple sessile polyps and linear ulcerations consistent with ischemic colitis, 1 large lesion distal transverse colon biopsied (removal as an outpatient).  Biopsy pending.  continue Protonix 40 mg twice daily.  Gastroenterology services have signed off   3/14- His Hb is stable yesterday  3/17- will need f/u with GI for colonoscopy after d/c.  9.  Acute blood loss anemia.  Follow-up CBC      Latest Ref Rng & Units 06/24/2023    5:11 AM 06/19/2023    5:45 AM 06/18/2023    5:00 AM  CBC  WBC 4.0 - 10.5 K/uL 4.7  8.2  8.7   Hemoglobin 13.0 - 17.0 g/dL 40.9  81.1  91.4   Hematocrit 39.0 - 52.0 % 32.4  33.6  33.7   Platelets 150 - 400 K/uL 281   257  235     10.  Leukocytosis resolved.  Empiric Zosyn discontinued, check WBC tomorrow   3/14- WBC 8.2 11.  A-fib with RVR/DDCV/pacemaker 2/25.  Cardiac rate controlled.  Amiodarone 400 mg twice daily, Lanoxin 0.125 mg daily.  Cardiac rate controlled. Check magnesium level tomorrow.   3/13- Mg level 1.9- K+  3.9-  12. Hx of prostate CA  3/13- wil get him bladder pads for bladder incontinence since prostate CA 13. Vitd D deficiency  3/17- was 18- will start 50k units q week  3/18- will need for 8 weeks    I spent a total of 37   minutes on total care today- >50% coordination of care- due to  D/w pt about d/c- he is insistent he leave- to work with him, will allow- d/w SW, and therapy about this as well as nursing   The patient is medically ready for discharge to home and will not need follow-up with [facility]. In addition, they will need to follow up with their PCP,  GI for colonoscopy and cardiology.    LOS: 6 days A FACE TO FACE EVALUATION WAS PERFORMED  Bahja Bence 06/24/2023, 8:33 AM

## 2023-06-24 NOTE — Progress Notes (Signed)
 Occupational Therapy Discharge Summary  Patient Details  Name: [name] [name] MRN: [id number] Date of Birth: [birth year]  Date of Discharge from OT service:June 24, 2023   Patient has met 8 of 8 long term goals due to improved activity tolerance, improved balance, postural control, and ability to compensate for deficits.  Pt made steady progress with BADLs and functional transfers during this admission. Pt is mod I for bathing at shower level and dressing with sit<>stand. Toileting and all transfers with mod I. Pt fatigues quickly. Energy conservation strategies have been communicated to pt and he has verbalized understanding of all recommendations. [name] wife has not been present for therapy.Patient to discharge at overall Modified Independent level.  Patient's care partner is independent to provide the necessary  intermittent supervision  assistance at discharge.    Reasons goals not met: n/a  Recommendation:  Patient will benefit from ongoing skilled OT services in outpatient setting to continue to advance functional skills in the area of BADL, iADL, and Reduce care partner burden.  Equipment: No equipment provided  Reasons for discharge: treatment goals met and discharge from hospital  Patient/family agrees with progress made and goals achieved: Yes  OT Discharge ADL ADL Equipment Provided: Reacher, Long-handled sponge Eating: Independent Where Assessed-Eating: Chair Grooming: Independent Where Assessed-Grooming: Sitting at sink Upper Body Bathing: Modified independent Where Assessed-Upper Body Bathing: Shower Lower Body Bathing: Modified independent Where Assessed-Lower Body Bathing: Shower Upper Body Dressing: Modified independent (Device) Where Assessed-Upper Body Dressing: Sitting at sink Lower Body Dressing: Modified independent Where Assessed-Lower Body Dressing: Edge of bed, Standing at sink Toileting: Modified independent Where Assessed-Toileting: Bedside Commode,  Actuary Transfer: Modified independent Toilet Transfer Method: Proofreader: Animator Transfer: Modified independent Tub/Shower Transfer Method: Ship broker: Information systems manager with back Film/video editor: Modified independent Film/video editor Method: Ambulating ADL Comments: Pt educated on ADL specific movements within sternal precautions in order to modify ways of completing ADL tasks. Pt standing to complete grooming around ~5 min with no LOB/SOB with no UE support Vision Baseline Vision/History: 0 No visual deficits Patient Visual Report: No change from baseline Vision Assessment?: No apparent visual deficits Perception  Perception: Within Functional Limits Cognition Cognition Overall Cognitive Status: Within Functional Limits for tasks assessed Arousal/Alertness: Awake/alert Orientation Level: Person;Place;Situation Person: Oriented Place: Oriented Situation: Oriented Attention: Sustained Sustained Attention: Appears intact Awareness: Appears intact Problem Solving: Appears intact Safety/Judgment: Appears intact Brief Interview for Mental Status (BIMS) Repetition of Three Words (First Attempt): 3 Temporal Orientation: Year: Correct Temporal Orientation: Month: Accurate within 5 days Temporal Orientation: Day: Correct Recall: "Sock": Yes, no cue required Recall: "Blue": Yes, no cue required Recall: "Bed": Yes, no cue required BIMS Summary Score: 15 Sensation Sensation Light Touch: Appears Intact Hot/Cold: Appears Intact Proprioception: Appears Intact Stereognosis: Not tested Coordination Gross Motor Movements are Fluid and Coordinated: Yes Fine Motor Movements are Fluid and Coordinated: Yes Motor  Motor Motor: Within Functional Limits;Other (comment) Motor - Skilled Clinical Observations: general weakness Trunk/Postural Assessment  Cervical Assessment Cervical Assessment: Within  Functional Limits Lumbar Assessment Lumbar Assessment: Within Functional Limits Postural Control Postural Control: Within Functional Limits  Balance Static Sitting Balance Static Sitting - Balance Support: Feet supported Static Sitting - Level of Assistance: 7: Independent Dynamic Sitting Balance Dynamic Sitting - Balance Support: During functional activity Dynamic Sitting - Level of Assistance: 6: Modified independent (Device/Increase time) Extremity/Trunk Assessment RUE Assessment RUE Assessment: Within Functional Limits Active Range of Motion (AROM) Comments: within sternal precautions General  Strength Comments: within sternal precautions LUE Assessment LUE Assessment: Within Functional Limits Active Range of Motion (AROM) Comments: within sternal precautions General Strength Comments: within sternal precautions  Velia Meyer, OTD, OTR/L [name] 06/24/2023, 10:55 AM

## 2023-06-24 NOTE — Progress Notes (Signed)
 Inpatient Rehabilitation Discharge Medication Review by a Pharmacist  A complete drug regimen review was completed for this patient to identify any potential clinically significant medication issues.   High Risk Drug Classes Is patient taking? Indication by Medication  Antipsychotic No   Anticoagulant Yes Apixaban - Afib  Antibiotic No   Opioid No   Antiplatelet Yes Aspirin -CAD  Hypoglycemics/insulin No   Vasoactive Medication Yes Amiodarone, digoxin - Afib  Chemotherapy No   Other Yes Acetaminophen - pain Atorvastatin- HLD Trigels - iron supplement/anemia Pantoprazole - Reflux  Vitamin D - supplement     Type of Medication Issue Identified Description of Issue Recommendation(s)  Drug Interaction(s) (clinically significant)     Duplicate Therapy     Allergy     No Medication Administration End Date     Incorrect Dose     Additional Drug Therapy Needed     Significant med changes from prior encounter (inform family/care partners about these prior to discharge). Prior to admission medications discontinued on discharge:   Lasix, losartan, toprol, doxylamine, and tramadol. Communicate to patient /family/ caregiver prior to discharge. It is noted to stop taking these medications in the discharge AVS.    Other      Clinically significant medication issues were identified that warrant physician communication and completion of prescribed/recommended actions by midnight of the next day:  No  Name of provider notified for urgent issues identified:   Provider Method of Notification:    Pharmacist comments:   Time spent performing this drug regimen review (minutes):  20    Thank you for allowing pharmacy to be part of this patients care team.  Noah Delaine, RPh Clinical Pharmacist 06/24/2023 11:41 AM

## 2023-06-24 NOTE — Progress Notes (Signed)
 Physical Therapy Note  Patient Details  Name: [name] [name] MRN: [id number] Date of Birth: 03/21/1946 Today's Date: 06/24/2023    Pt presents supine in bed and sates his wife is on the way for him to D/C to home.  Pt had no questions for safe D/C and thankful for therapy given.  Missed time of 45'.   Lucio Edward 06/24/2023, 2:08 PM

## 2023-06-24 NOTE — Progress Notes (Signed)
 Physical Therapy Discharge Summary  Patient Details  Name: [name] [name] MRN: [id number] Date of Birth: 1946-03-08  Date of Discharge from PT service:June 24, 2023  Today's Date: 06/24/2023 PT Individual Time: 0805-0900 PT Individual Time Calculation (min): 55 min    Patient has met 8 of 8 long term goals due to improved activity tolerance, improved balance, and improved coordination.  Patient to discharge at an ambulatory level Modified Independent.     Reasons goals not met: N/A   Recommendation:  Patient will benefit from ongoing skilled PT services in outpatient setting to continue to advance safe functional mobility, address ongoing impairments in balance, coordination, and minimize fall risk.  Equipment: No equipment provided  Reasons for discharge: treatment goals met and discharge from hospital  Patient/family agrees with progress made and goals achieved: Yes  PT Discharge Pain Interference Pain Interference Pain Effect on Sleep: 1. Rarely or not at all Pain Interference with Therapy Activities: 1. Rarely or not at all Pain Interference with Day-to-Day Activities: 1. Rarely or not at all Cognition Overall Cognitive Status: Within Functional Limits for tasks assessed Arousal/Alertness: Awake/alert Orientation Level: Oriented X4 Attention: Sustained Sustained Attention: Appears intact Memory: Appears intact Awareness: Appears intact Problem Solving: Appears intact Safety/Judgment: Appears intact Sensation Sensation Light Touch: Appears Intact Hot/Cold: Appears Intact Proprioception: Appears Intact Stereognosis: Not tested Coordination Gross Motor Movements are Fluid and Coordinated: Yes Fine Motor Movements are Fluid and Coordinated: Yes Motor  Motor Motor: Within Functional Limits Motor - Skilled Clinical Observations: WFL  Mobility Bed Mobility Bed Mobility: Rolling Right;Rolling Left;Supine to Sit;Sit to Supine Rolling Right: Independent with  assistive device Rolling Left: Independent with assistive device Supine to Sit: Independent with assistive device Sit to Supine: Independent with assistive device Transfers Transfers: Sit to Stand;Stand to Sit;Stand Pivot Transfers;Transfer Sit to Stand: Independent with assistive device Stand to Sit: Independent with assistive device Stand Pivot Transfers: Independent with assistive device Transfer (Assistive device): Rollator Locomotion  Gait Ambulation: Yes Gait Assistance: Independent with assistive device Gait Distance (Feet): 665 Feet Assistive device: Rollator Gait Gait: Yes Gait Pattern: Within Functional Limits Gait Pattern: Within Functional Limits Gait velocity: WFL Stairs / Additional Locomotion Stairs: Yes Stairs Assistance: Independent with assistive device Stair Management Technique: One rail Right;One rail Left Number of Stairs: 12 Height of Stairs: 6 Ramp: Independent with assistive device Curb: Independent with assistive device Pick up small object from the floor assist level: Supervision/Verbal cueing Pick up small object from the floor assistive device: none Wheelchair Mobility Wheelchair Mobility: No  Trunk/Postural Assessment  Cervical Assessment Cervical Assessment: Within Functional Limits Thoracic Assessment Thoracic Assessment: Within Functional Limits Lumbar Assessment Lumbar Assessment: Within Functional Limits Postural Control Postural Control: Within Functional Limits  Balance Balance Balance Assessed: Yes Static Sitting Balance Static Sitting - Balance Support: Feet supported Static Sitting - Level of Assistance: 7: Independent Dynamic Sitting Balance Dynamic Sitting - Balance Support: During functional activity;Bilateral upper extremity supported Dynamic Sitting - Level of Assistance: 6: Modified independent (Device/Increase time) Dynamic Sitting - Balance Activities: Forward lean/weight shifting;Reaching for objects Static Standing  Balance Static Standing - Balance Support: During functional activity;Bilateral upper extremity supported Static Standing - Level of Assistance: 6: Modified independent (Device/Increase time) Dynamic Standing Balance Dynamic Standing - Balance Support: During functional activity;Bilateral upper extremity supported Dynamic Standing - Level of Assistance: 6: Modified independent (Device/Increase time) Dynamic Standing - Balance Activities: Reaching for objects;Forward lean/weight shifting;Reaching across midline Extremity Assessment  RUE Assessment RUE Assessment: Within Functional Limits Active Range of  Motion (AROM) Comments: within sternal precautions General Strength Comments: within sternal precautions LUE Assessment LUE Assessment: Within Functional Limits Active Range of Motion (AROM) Comments: within sternal precautions General Strength Comments: within sternal precautions RLE Assessment RLE Assessment: Within Functional Limits LLE Assessment LLE Assessment: Within Functional Limits    Pt agreeable to PT session with emphasis on mobility, flexibility and ROM training. Pt without reports of pain, feels confident with discharge. Mod I with bed mobility, transfers, and gait with rollator >200' in session. Pt with limited hip mobility, PT performed passive mobility for hip flex, IR, and ER and educated pt on HEP (piriformis & adductor stretches) to facilitate improved ability to perform foot hygiene per patients goal. Pt left in restroom with nurse present for care. Discussed benefits of OP pelvic PT for urinary frequency and leakage.     Truitt Leep Truitt Leep PT, DPT  06/24/2023, 1:02 PM

## 2023-06-24 NOTE — Progress Notes (Signed)
 Heart Failure Nurse Navigator Progress Note    Patients original HF TOC appointment scheduled for 07/10/2023 @ 9:30 am was changed to Friday, 07/11/2023 @ 9:30 am due to HF TOC scheduling conflict. Patient was called and a voicemail was left on [phone] @ 13:25 pm 06/24/2023.    Rhae Hammock, BSN, Scientist, clinical (histocompatibility and immunogenetics) Only

## 2023-06-27 NOTE — Telephone Encounter (Signed)
 Transmission received 06/25/2023.

## 2023-06-30 ENCOUNTER — Telehealth (HOSPITAL_COMMUNITY): Payer: Self-pay

## 2023-06-30 NOTE — Telephone Encounter (Signed)
 Pt insurance is active and benefits verified through [facility]. Co-pay $0, DED $0/$0 met, out of pocket $3,150/$476.52 met, co-insurance 0%. No pre-authorization required. Passport, 06/30/23 @ 9:31am, REF# [id number].  How many CR sessions are covered? (36 visits for TCR, 72 visits for ICR)72 Is this a lifetime maximum or an annual maximum? Annual Has the member used any of these services to date? No Is there a time limit (weeks/months) on start of program and/or program completion? No   Will contact patient to see if he is interested in the Cardiac Rehab Program. If interested, patient will need to complete follow up appt. Once completed, patient will be contacted for scheduling upon review by the RN Navigator.

## 2023-06-30 NOTE — Telephone Encounter (Signed)
 Called pt to confirm interest in CR program, will schedule after follow-up on 07/08/23.

## 2023-07-06 NOTE — Progress Notes (Unsigned)
 Cardiology Office Note    Patient Name: [name] [name] Date of Encounter: 07/07/2023  Primary Care Provider:  [name], MD Primary Cardiologist:  [name] Pyo, MD Primary Electrophysiologist: [name] Bunting, MD   Past Medical History    Past Medical History:  Diagnosis Date   Arthritis    Back   CVA (cerebral infarction) 07/14/2009   TIA   Dysrhythmia    A. Fib   Heart murmur    History of kidney stones    Hyperlipidemia    Hypertension    Nephrolithiasis    Paroxysmal atrial fibrillation (HCC)    Paroxysmal atrial flutter (HCC)    a. s/p ablation 2012.   Prostate cancer [facility])    Stroke [facility])    TIA - no deficits    History of Present Illness  [name] is a 78 y.o. male with a PMH of HFmrEF,paroxysmal atrial flutter s/p ablation 2012 (on Eliquis), prostate CA, CVA/TIA 2011, HTN, HLD, severe MR/TR s/p MVR/TV MVR with maze and radiofrequency, sinus node dysfunction s/p Saint Jude PPM who presents today for posthospital follow-up.  [name] [name] was previously followed by Dr. Clifton [name] and is currently followed by Dr. Lynnette [name] for management of PAF.  He was initially diagnosed with AF in 2009 while being worked up for back surgery.  He was seen initially by Dr. Ladona [name] in 2011 and was started on flecainide after undergoing GXT that was normal.  He developed persistent AF despite AAD and underwent flutter ablation on 10/2010 by Dr. Ladona [name] and continued to be maintained on flecainide.  He underwent a 2D echo on 12/31/2022 by his PCP that showed EF of 45-50% with mild RV dysfunction and severe LAE, moderate RAE and moderate to severe MR.  He was referred to the structural heart team and seen by Dr. Lynnette [name] for possible MitraClip on 01/13/2023 and underwent further evaluation with TEE as well as R/LHC. R/LHC was completed on 02/27/2023 and revealed mild nonobstructive CAD (30% proximal to mid LAD), mildly elevated PASP.  He was started on Lasix 20 mg and referred to Dr. Leafy [name]  for consideration of open repair.  He continued to develop worsening fatigue and decided against MitraClip and was seen by Dr. Leafy [name] on 04/17/2023 with plan to complete repair after resolution of UTI.  He was admitted on 05/27/2023 and underwent successful procedure with MR and TR repair.  He developed tachybradycardia following procedure as well as expected volume overload.  He also developed postop AF and underwent postop DC on 06/03/2023 and remained bradycardic following procedure for with VVI backup pacing.  He underwent placement of Saint Jude PPM.  He developed AF with RVR and rectal bleeding with apixaban and aspirin stopped with decrease in hemoglobin at 9.7.  He was given multiple PRBCs due to hemorrhagic shock with colonoscopy showing ischemic colitis with plan for outpatient completion.  He was admitted to inpatient rehab 06/18/2023 due to severe deconditioning/debility following procedure and was discharged on 06/24/2023.  [name] [name] presents today for posthospital follow-up.  During today's visit his blood pressure was elevated at 93 bpm. He was discharged with amiodarone and digoxin to manage his heart rate. He experiences a persistent metallic taste in his mouth, particularly noticeable during meals, which he associates with these medications. He has also experienced  fatigue and shortness of breath, which he describes as more pronounced post-surgery. Swelling in his feet is noted but has not increased since before the surgery. No fever or chills are present. He  occasionally drives due to his wife's knee replacement surgery. He reports a recent onset of urinary symptoms, including a burning sensation during urination, frequent urges, and occasional incontinence, which he attributes to a possible bladder infection. He has a history of kidney stones and notes previous antibiotic treatment before surgery.  He reports good protein intake but does note that he is not drinking enough water. Patient denies  chest pain, palpitations, dyspnea, PND, orthopnea, nausea, vomiting, dizziness, syncope, edema, weight gain, or early satiety.  Discussed the use of AI scribe software for clinical note transcription with the patient, who gave verbal consent to proceed.  History of Present Illness   Review of Systems  Please see the history of present illness.    All other systems reviewed and are otherwise negative except as noted above.  Physical Exam    Wt Readings from Last 3 Encounters:  07/07/23 196 lb (88.9 kg)  06/24/23 184 lb 15.5 oz (83.9 kg)  06/17/23 191 lb 9.6 oz (86.9 kg)   VS: Vitals:   07/07/23 0956  BP: 102/68  Pulse: (!) 109  SpO2: 97%  ,Body mass index is 28.12 kg/m. GEN: Well nourished, well developed in no acute distress Neck: No JVD; No carotid bruits Pulmonary: Clear to auscultation without rales, wheezing or rhonchi  Cardiovascular: Irregularly irregular normal S1. Normal S2.  Sternal incision is clean dry and intact with no ecchymosis or evidence of infection Murmurs: There is no murmur.  ABDOMEN: Soft, non-tender, non-distended EXTREMITIES: Bilateral +2 pitting edema  EKG/LABS/ Recent Cardiac Studies   ECG personally reviewed by me today -none completed today  Risk Assessment/Calculations:    CHA2DS2-VASc Score = 5   This indicates a 7.2% annual risk of stroke. The patient's score is based upon: CHF History: 1 HTN History: 1 Diabetes History: 0 Stroke History: 0 Vascular Disease History: 1 Age Score: 2 Gender Score: 0         Lab Results  Component Value Date   WBC 4.7 06/24/2023   HGB 10.4 (L) 06/24/2023   HCT 32.4 (L) 06/24/2023   MCV 90.0 06/24/2023   PLT 281 06/24/2023   Lab Results  Component Value Date   CREATININE 0.99 06/24/2023   BUN 13 06/24/2023   NA 139 06/24/2023   K 3.8 06/24/2023   CL 109 06/24/2023   CO2 23 06/24/2023   Lab Results  Component Value Date   CHOL 111 12/06/2022   HDL 28 (L) 12/06/2022   LDLCALC 65  12/06/2022   TRIG 98 12/06/2022   CHOLHDL 4.0 12/06/2022    Lab Results  Component Value Date   HGBA1C 5.8 (H) 05/23/2023   Assessment & Plan    1.  Nonrheumatic TR/MR: -s/p MVR/TVR repair with maze and atrial clip complicated by hemorrhagic shock due to GI bleed and ischemic colitis -Today patient reports ongoing shortness of breath denies increased fatigue. -Sternal incision is clean dry and intact no evidence of infection -SBE prophylaxis discussed -Postprocedure TEE showed stable MVR/TVR with EF of 35 as 40%  - Order echocardiogram to assess mitral valve function - Recommend cardiac rehabilitation pending cardiac surgeon clearance - Monitor for heart failure symptoms, including dyspnea and fatigue -Check BNP, CMET, CBC  2.  Permanent AF: -Persistent following surgery and currently on amiodarone 200 mg daily and 0.125 mg -During today's visit patient's rate was elevated but controlled -Continue Eliquis 2.5 mg twice daily -Experiences metallic taste, possibly due to medications amiodarone  -We will check TSH CMET today -Plan to  transition to metoprolol once heart rate stabilizes.  3.  Sinus node dysfunction: -s/p Saint Jude PPM placed -Initial Paceart report not available for review  4.  Mild CAD: -Patient reports no chest pain or angina since procedure. -Continue ASA 81 mg and Lipitor 20 mg daily  5.  Essential hypertension: -Patient blood pressure today was stable at 102/68  6.  Hyperlipidemia: -Patient's last LDL cholesterol was 65 -Continue Lipitor 20 mg daily  7.HFmrEF: -Patient's 2D echo down from 55% to 35-40% -NYHA class II/III symptoms -Today patient is mildly volume up on exam with +2 lower extremity edema -Patient will start Lasix 20 mg twice daily x 3 days and then 20 mg as needed -Start potassium 20 mEq daily while taking Lasix -GDMT currently not titrated due to possible UTI and soft BP. -Patient is scheduled to follow-up with [facility] clinic for further  titration and evaluation of CHF symptoms.  8.Possible UTI: -symptoms of dysuria, urgency, and frequency suggestive of a urinary tract infection. History of nephrolithiasis and recent antibiotic use noted. Adequate hydration is crucial to prevent recurrence. - Recommend urinalysis to confirm UTI - Advise follow-up with urologist or primary care for management - Ensure adequate hydration to prevent UTI recurrence    Cardiac Rehabilitation Eligibility Assessment       Disposition: Follow-up with [name] Pyo, MD or APP in 3 months    Signed, Napoleon Form, Leodis Rains, NP 07/07/2023, 12:25 PM [facility]

## 2023-07-07 ENCOUNTER — Other Ambulatory Visit: Payer: Self-pay | Admitting: Thoracic Surgery (Cardiothoracic Vascular Surgery)

## 2023-07-07 ENCOUNTER — Ambulatory Visit: Attending: Nurse Practitioner | Admitting: Nurse Practitioner

## 2023-07-07 ENCOUNTER — Telehealth: Payer: Self-pay

## 2023-07-07 ENCOUNTER — Encounter: Payer: Self-pay | Admitting: Nurse Practitioner

## 2023-07-07 ENCOUNTER — Other Ambulatory Visit: Payer: Self-pay

## 2023-07-07 VITALS — BP 102/68 | HR 109 | Ht 70.0 in | Wt 196.0 lb

## 2023-07-07 DIAGNOSIS — I4821 Permanent atrial fibrillation: Secondary | ICD-10-CM

## 2023-07-07 DIAGNOSIS — I340 Nonrheumatic mitral (valve) insufficiency: Secondary | ICD-10-CM | POA: Diagnosis not present

## 2023-07-07 DIAGNOSIS — Z950 Presence of cardiac pacemaker: Secondary | ICD-10-CM

## 2023-07-07 DIAGNOSIS — Z9889 Other specified postprocedural states: Secondary | ICD-10-CM | POA: Diagnosis not present

## 2023-07-07 DIAGNOSIS — I361 Nonrheumatic tricuspid (valve) insufficiency: Secondary | ICD-10-CM | POA: Diagnosis not present

## 2023-07-07 DIAGNOSIS — I495 Sick sinus syndrome: Secondary | ICD-10-CM

## 2023-07-07 DIAGNOSIS — E785 Hyperlipidemia, unspecified: Secondary | ICD-10-CM

## 2023-07-07 DIAGNOSIS — I10 Essential (primary) hypertension: Secondary | ICD-10-CM

## 2023-07-07 DIAGNOSIS — I071 Rheumatic tricuspid insufficiency: Secondary | ICD-10-CM

## 2023-07-07 MED ORDER — FUROSEMIDE 20 MG PO TABS: 20.0000 mg | ORAL_TABLET | Freq: Every day | ORAL | 0 refills | Status: DC

## 2023-07-07 MED ORDER — POTASSIUM CHLORIDE CRYS ER 20 MEQ PO TBCR
20.0000 meq | EXTENDED_RELEASE_TABLET | Freq: Every day | ORAL | 0 refills | Status: DC
Start: 2023-07-07 — End: 2023-07-25

## 2023-07-07 NOTE — Telephone Encounter (Signed)
 Copied from CRM [id number]. Topic: Clinical - Medical Advice >> Jul 07, 2023 12:09 PM Gery Pray wrote: Reason for CRM: Patient would like to have blood work completed during his appt with [facility] tomorrow. Blood work was ordered by his Cardiologists. Patient would also like to have a urine test complete as he feels he still have a bladder infection. Patient has lab orders and will bring them to his appt

## 2023-07-07 NOTE — Patient Instructions (Addendum)
 Medication Instructions:  START Lasix 20mg  take 1 tablet twice a day for 3 days then take as needed START Potassium take once a day when taking Lasix  *If you need a refill on your cardiac medications before your next appointment, please call your pharmacy*  Lab Work: TODAY-BMET, BNP. CBC, TSH 1 WEEK BMET If you have labs (blood work) drawn today and your tests are completely normal, you will receive your results only by: MyChart Message (if you have MyChart) OR A paper copy in the mail If you have any lab test that is abnormal or we need to change your treatment, we will call you to review the results.  Testing/Procedures: Your physician has requested that you have an echocardiogram. Echocardiography is a painless test that uses sound waves to create images of your heart. It provides your doctor with information about the size and shape of your heart and how well your heart's chambers and valves are working. This procedure takes approximately one hour. There are no restrictions for this procedure. Please do NOT wear cologne, perfume, aftershave, or lotions (deodorant is allowed). Please arrive 15 minutes prior to your appointment time.  Please note: We ask at that you not bring children with you during ultrasound (echo/ vascular) testing. Due to room size and safety concerns, children are not allowed in the ultrasound rooms during exams. Our front office staff cannot provide observation of children in our lobby area while testing is being conducted. An adult accompanying a patient to their appointment will only be allowed in the ultrasound room at the discretion of the ultrasound technician under special circumstances. We apologize for any inconvenience.  Follow-Up: At [facility], you and your health needs are our priority.  As part of our continuing mission to provide you with exceptional heart care, our providers are all part of one team.  This team includes your primary  Cardiologist (physician) and Advanced Practice Providers or APPs (Physician Assistants and Nurse Practitioners) who all work together to provide you with the care you need, when you need it.  Your next appointment:   3 month(s)  Provider:   [name], MD  or Robin Searing, NP   We recommend signing up for the patient portal called "MyChart".  Sign up information is provided on this After Visit Summary.  MyChart is used to connect with patients for Virtual Visits (Telemedicine).  Patients are able to view lab/test results, encounter notes, upcoming appointments, etc.  Non-urgent messages can be sent to your provider as well.   To learn more about what you can do with MyChart, go to [email or web address].   Other Instructions Speak with your pcp about possible uti Please check your weight daily. Please contact the office if you gain more than 2lbs in a day or 5lbs in a week.  Limit your salt intake to 1500-2000mg  per day or 500mg  of Sodium per meal.       1st Floor: - Lobby - Registration  - Pharmacy  - Lab - Cafe  2nd Floor: - PV Lab - Diagnostic Testing (echo, CT, nuclear med)  3rd Floor: - Vacant  4th Floor: - TCTS (cardiothoracic surgery) - AFib Clinic - Structural Heart Clinic - Vascular Surgery  - Vascular Ultrasound  5th Floor: - HeartCare Cardiology (general and EP) - Clinical Pharmacy for coumadin, hypertension, lipid, weight-loss medications, and med management appointments    Valet parking services will be available as well.

## 2023-07-07 NOTE — Patient Instructions (Signed)
 You may return to driving an automobile as long as you are no longer requiring oral narcotic pain relievers during the daytime.  It would be wise to start driving only short distances during the daylight and gradually increase from there as you feel comfortable.  You are encouraged to enroll and participate in the outpatient cardiac rehab program beginning as soon as practical.  Continue to avoid any heavy lifting or strenuous use of your arms or shoulders for at least a total of three months from the time of surgery.  After three months you may gradually increase how much you lift or otherwise use your arms or chest as tolerated, with limits based upon whether or not activities lead to the return of significant discomfort.  Endocarditis is a potentially serious infection of heart valves or inside lining of the heart.  It occurs more commonly in patients with diseased heart valves (such as patient's with aortic or mitral valve disease) and in patients who have undergone heart valve repair or replacement.  Certain surgical and dental procedures may put you at risk, such as dental cleaning, other dental procedures, or any surgery involving the respiratory, urinary, gastrointestinal tract, gallbladder or prostate gland.   To minimize your chances for develooping endocarditis, maintain good oral health and seek prompt medical attention for any infections involving the mouth, teeth, gums, skin or urinary tract.    Always notify your doctor or dentist about your underlying heart valve condition before having any invasive procedures. You will need to take antibiotics before certain procedures, including all routine dental cleanings or other dental procedures.  Your cardiologist or dentist should prescribe these antibiotics for you to be taken ahead of time.

## 2023-07-08 ENCOUNTER — Encounter: Payer: Self-pay | Admitting: Physician Assistant

## 2023-07-08 ENCOUNTER — Ambulatory Visit
Admission: RE | Admit: 2023-07-08 | Discharge: 2023-07-08 | Disposition: A | Discharge status: Home / Self Care | Source: Ambulatory Visit | Attending: Thoracic Surgery (Cardiothoracic Vascular Surgery)

## 2023-07-08 ENCOUNTER — Ambulatory Visit (INDEPENDENT_AMBULATORY_CARE_PROVIDER_SITE_OTHER): Payer: Self-pay | Admitting: Physician Assistant

## 2023-07-08 VITALS — BP 128/82 | HR 87 | Resp 18 | Ht 70.0 in | Wt 198.0 lb

## 2023-07-08 DIAGNOSIS — I071 Rheumatic tricuspid insufficiency: Secondary | ICD-10-CM

## 2023-07-08 DIAGNOSIS — I340 Nonrheumatic mitral (valve) insufficiency: Secondary | ICD-10-CM

## 2023-07-08 DIAGNOSIS — Z9889 Other specified postprocedural states: Secondary | ICD-10-CM

## 2023-07-09 ENCOUNTER — Ambulatory Visit: Admitting: Family Medicine

## 2023-07-09 ENCOUNTER — Encounter: Payer: Self-pay | Admitting: Family Medicine

## 2023-07-09 VITALS — BP 132/78 | HR 78 | Temp 97.7°F | Ht 70.0 in | Wt 192.0 lb

## 2023-07-09 DIAGNOSIS — R350 Frequency of micturition: Secondary | ICD-10-CM | POA: Diagnosis not present

## 2023-07-09 DIAGNOSIS — R300 Dysuria: Secondary | ICD-10-CM | POA: Diagnosis not present

## 2023-07-09 DIAGNOSIS — Z8546 Personal history of malignant neoplasm of prostate: Secondary | ICD-10-CM | POA: Diagnosis not present

## 2023-07-09 DIAGNOSIS — N3001 Acute cystitis with hematuria: Secondary | ICD-10-CM | POA: Diagnosis not present

## 2023-07-09 LAB — URINALYSIS, ROUTINE W REFLEX MICROSCOPIC
Bilirubin Urine: NEGATIVE
Glucose, UA: NEGATIVE
Ketones, ur: NEGATIVE
Nitrite: POSITIVE — AB
Specific Gravity, Urine: >=1.03 (ref 1.001–1.035)
pH: 5.5 (ref 5.0–8.0)

## 2023-07-09 LAB — MICROSCOPIC MESSAGE

## 2023-07-09 IMAGING — MR MR LUMBAR SPINE W/O CM
4 of 5 series · 19 of 48 positions shown · non-contrast
Comparison: 03/02/2008

CLINICAL DATA: Low back pain.  History of prior surgery

EXAM:
MRI LUMBAR SPINE WITHOUT CONTRAST
TECHNIQUE: Multiplanar, multisequence MR imaging of the lumbar spine was
performed. No intravenous contrast was administered.

[Series 5: T2 · sagittal · 4.0mm · 0.68mm/px · 7 of 15 slices shown (1 of 2)]
[im 1/15]
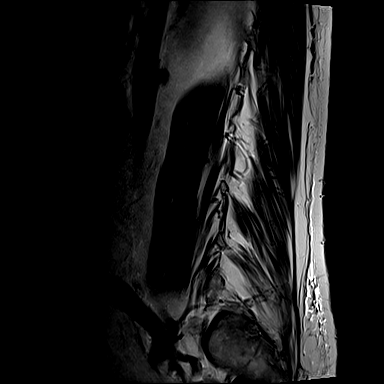
[im 3/15]
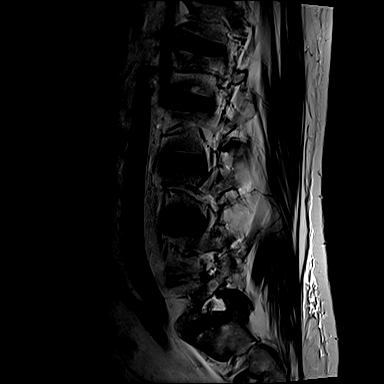
[im 5/15]
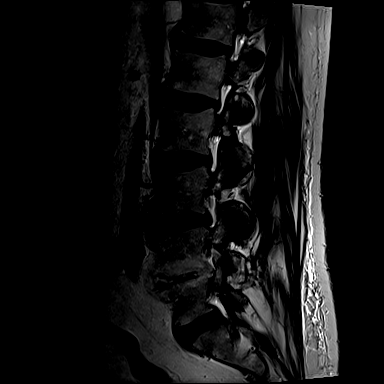
[im 8/15]
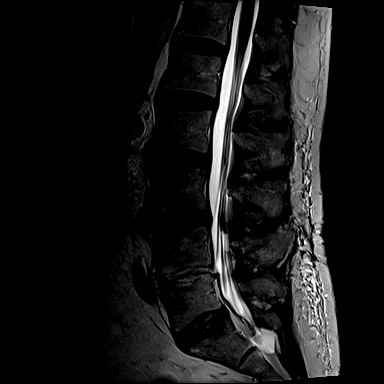
[im 10/15]
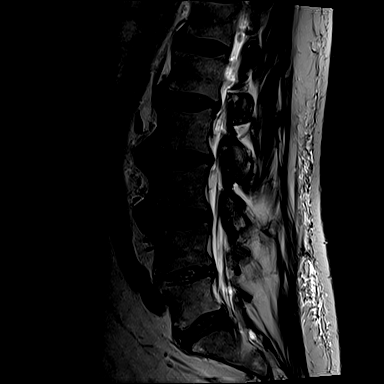
[im 12/15]
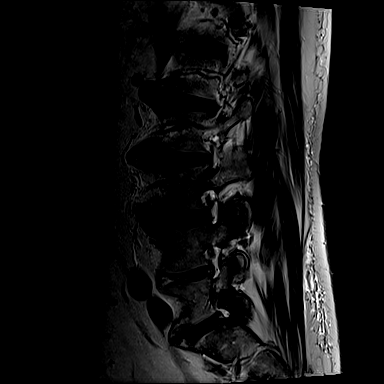
[im 15/15]
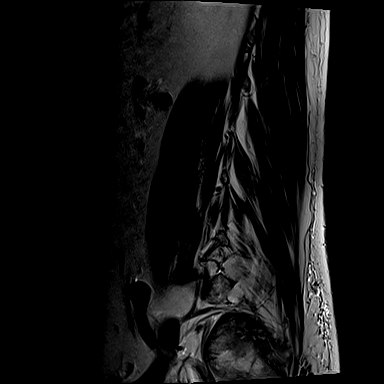

[Series 6: T1 · sagittal · 4.0mm · 0.68mm/px · 3 of 15 slices shown (1 of 2)]
[im 3/15]
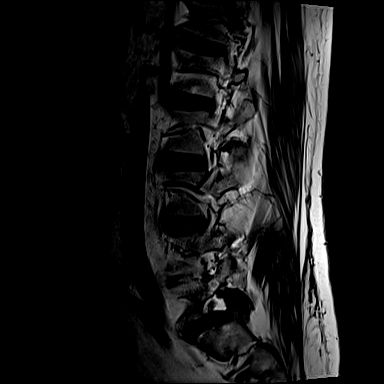
[im 9/15]
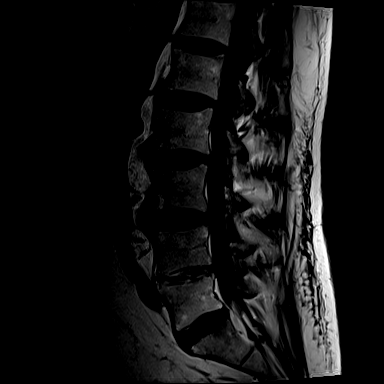
[im 15/15]
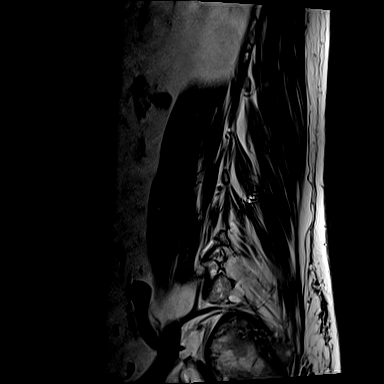

[Series 12: T2 · axial · 4.0mm · 0.28mm/px · z∈[-62,+139]mm · 6 of 46 slices shown (2 of 2)]
[im 3/46]
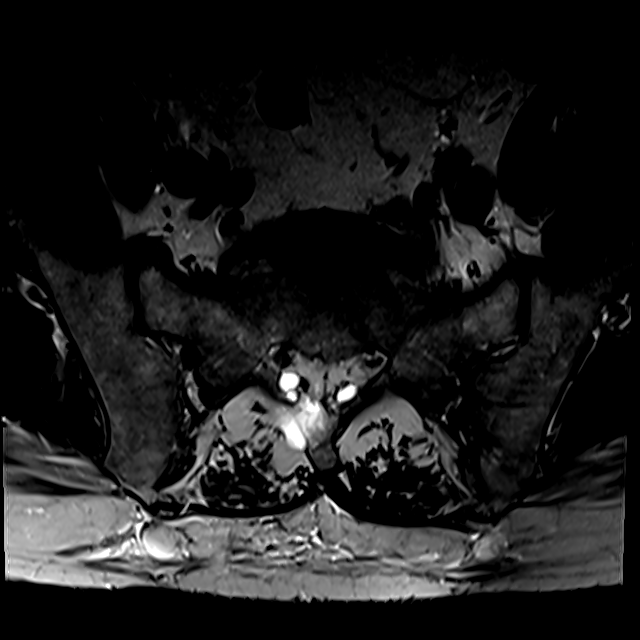
[im 8/46]
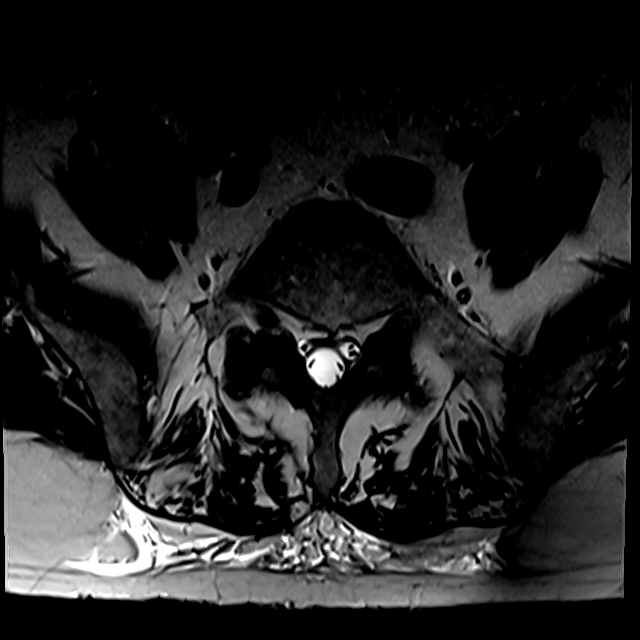
[im 13/46]
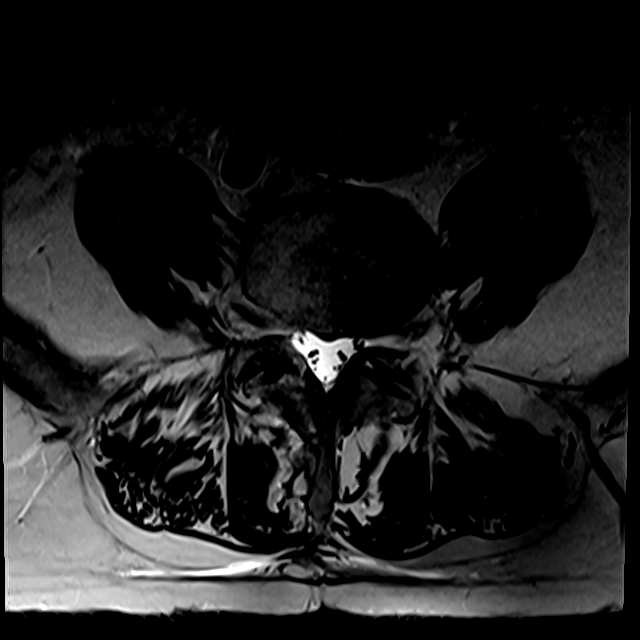
[im 21/46]
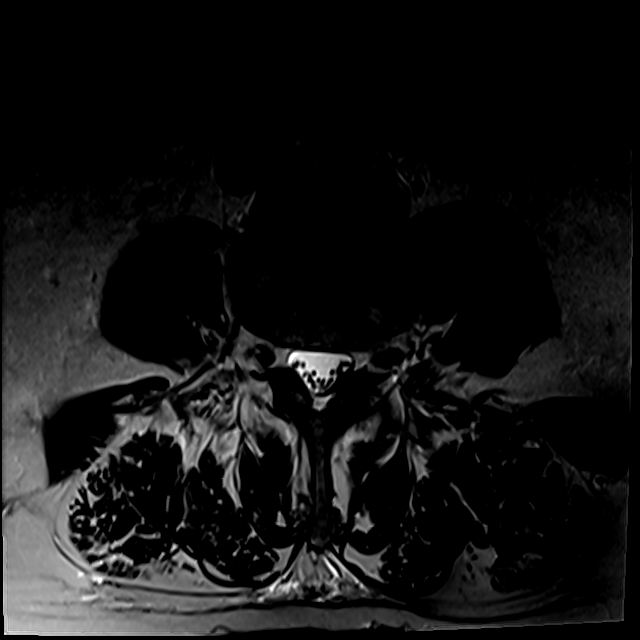
[im 23/46]
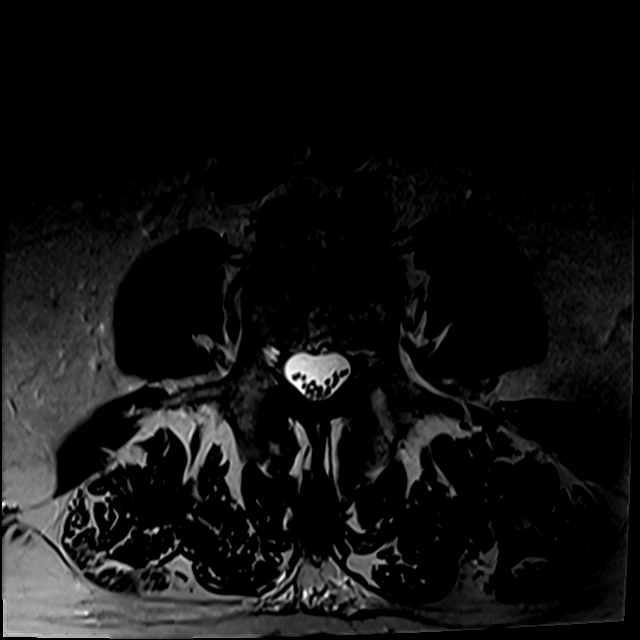
[im 38/46]
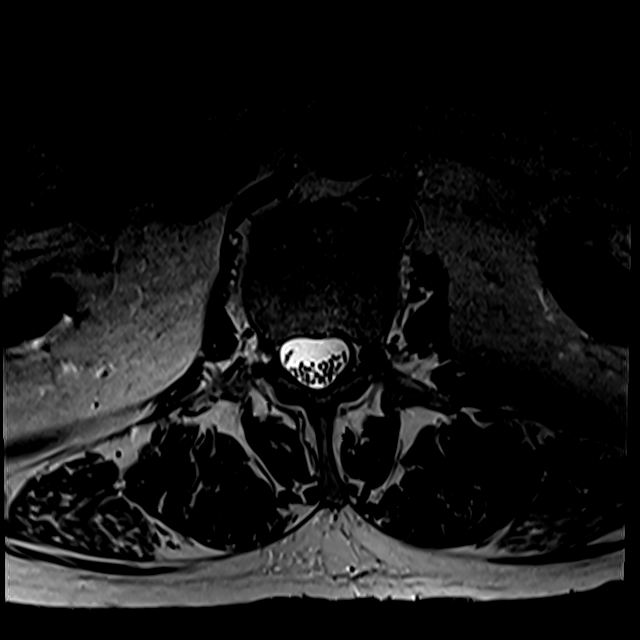

[Series 100: T1 · axial · 4.0mm · 0.28mm/px · z∈[-62,+31]mm · 3 of 25 slices shown (2 of 2)]
[im 3/25]
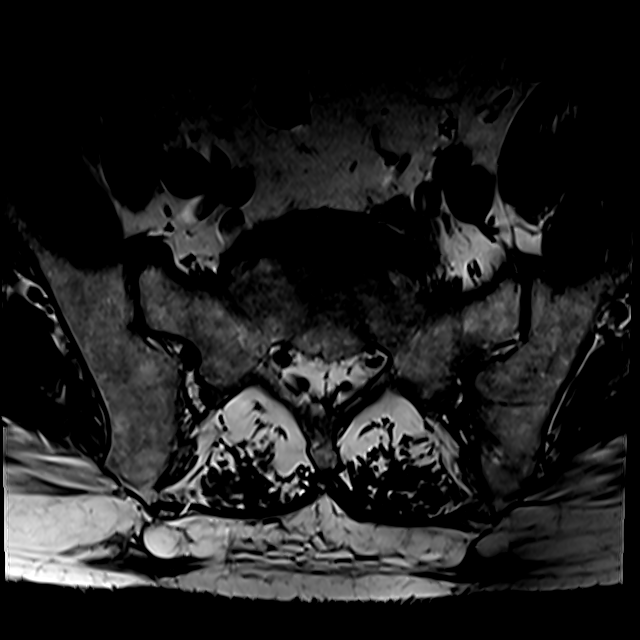
[im 14/25]
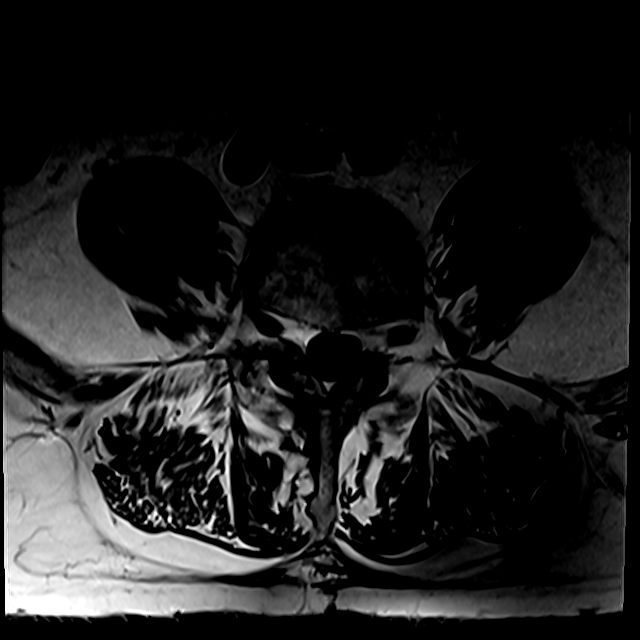
[im 22/25]
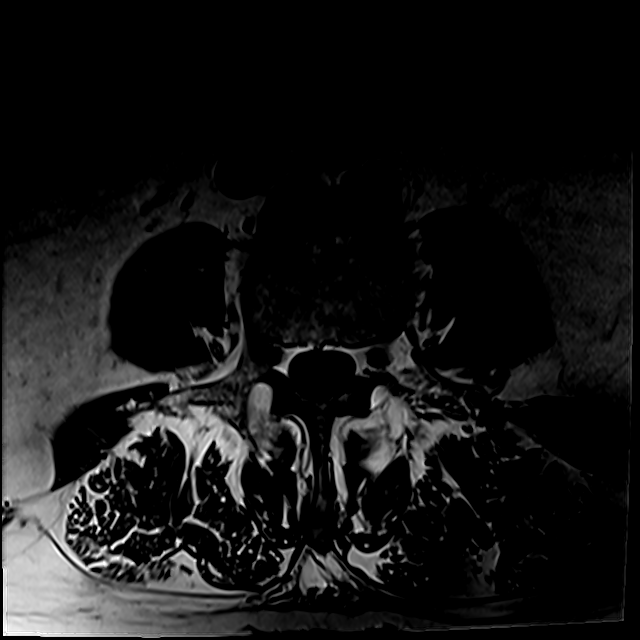

[19 of 48 positions shown; findings below may reference images not displayed]

FINDINGS: Segmentation: 5 lumbar type vertebrae when correlated with prior and
assuming small ribs at T12 by radiography

Alignment:  Physiologic

Vertebrae:  No fracture, evidence of discitis, or bone lesion.

Conus medullaris and cauda equina: Conus extends to the L1 level.
Conus and cauda equina appear normal. Tarlov cyst on the right at
S1-2 which has partially decompressed and now has a fluid fluid
level, 19 mm in diameter.

Paraspinal and other soft tissues: No evidence of perispinal mass or
inflammation.

Disc levels:

T12- L1: Ventral spondylitic spur

L1-L2: Right foraminal predominant disc bulging with ventral
spondylitic spurring

L2-L3: Facet spurring bilaterally. Ventral spondylitic spurring.
Mild disc bulging.

L3-L4: Degenerative facet spurring. Circumferential disc bulging
with left foraminal protrusion. Mild spinal stenosis

L4-L5: Intervertebral fusion or ankylosis. Prior decompression. No
neural compression

L5-S1:Degenerative facet spurring. Disc bulging and endplate
spurring asymmetric to the left. Mild borderline moderate left
foraminal narrowing.
IMPRESSION: 1. Partially collapsed Tarlov cyst on the right at S1-2 with
hematocrit/debris level, question related symptomatology.
2. L4-5 decompression with solid fusion or ankylosis.
3. Degenerative changes at the other levels are described above. Up
to mild/moderate foraminal narrowing on the left at L5-S1.

## 2023-07-09 MED ORDER — CEPHALEXIN 500 MG PO CAPS: 500.0000 mg | ORAL_CAPSULE | Freq: Two times a day (BID) | ORAL | 0 refills | Status: DC

## 2023-07-09 MED ORDER — NITROFURANTOIN MONOHYD MACRO 100 MG PO CAPS: 100.0000 mg | ORAL_CAPSULE | Freq: Two times a day (BID) | ORAL | 0 refills | Status: DC

## 2023-07-09 NOTE — Progress Notes (Signed)
 Subjective:  HPI: [name] [name] is a 78 y.o. male presenting on 07/09/2023 for Follow-up (Pt here for medication refills. Pt would like to have labs drawn from Cardiologist, BMET and BNP)   HPI Patient is in today for dysuria and incomplete emptying, followed by polyuria since Sunday. Associated with hematuria. He does have PMH of this and prostate CA. Denies fever, chills, flank pain. Has tried nothing. Denies nausea, vomiting, endorses diarrhea, no hematochezia or melena.   Review of Systems  All other systems reviewed and are negative.   Relevant past medical history reviewed and updated as indicated.   Past Medical History:  Diagnosis Date   Arthritis    Back   CVA (cerebral infarction) 07/14/2009   TIA   Dysrhythmia    A. Fib   Heart murmur    History of kidney stones    Hyperlipidemia    Hypertension    Nephrolithiasis    Paroxysmal atrial fibrillation (HCC)    Paroxysmal atrial flutter (HCC)    a. s/p ablation 2012.   Prostate cancer [facility])    Stroke [facility])    TIA - no deficits     Past Surgical History:  Procedure Laterality Date   APPENDECTOMY  1967   BONE BIOPSY  06/14/2023   Procedure: BIOPSY,Colon;  Surgeon: Benancio Deeds, MD;  Location: [facility] ENDOSCOPY;  Service: Gastroenterology;;   CARDIOVERSION N/A 06/03/2023   Procedure: CARDIOVERSION;  Surgeon: Jodelle Red, MD;  Location: [facility] INVASIVE CV LAB;  Service: Cardiovascular;  Laterality: N/A;   CATARACT EXTRACTION W/ INTRAOCULAR LENS IMPLANT Bilateral    CERVICAL SPINE SURGERY     Cadaver Bone Placed   COLONOSCOPY     COLONOSCOPY N/A 06/14/2023   Procedure: COLONOSCOPY;  Surgeon: Benancio Deeds, MD;  Location: [facility] ENDOSCOPY;  Service: Gastroenterology;  Laterality: N/A;   CYSTOSCOPY/URETEROSCOPY/HOLMIUM LASER/STENT PLACEMENT Left 09/19/2020   Procedure: CYSTOSCOPY RETROGRADE PYELOGRAM URETEROSCOPY/HOLMIUM LASER/STENT PLACEMENT;  Surgeon: Bjorn Pippin, MD;  Location: [facility] ORS;  Service: Urology;   Laterality: Left;   EYE SURGERY Bilateral    Eye Lid lifting   LUMBAR SPINE SURGERY     Ruptured Disc   MAZE N/A 05/27/2023   Procedure: MAZE;  Surgeon: Eugenio Hoes, MD;  Location: [facility] OR;  Service: Open Heart Surgery;  Laterality: N/A;   MITRAL VALVE REPAIR N/A 05/27/2023   Procedure: MITRAL VALVE REPAIR USING MEDTRONIC SIMULUS SEMI-RIDGID ANNULOPLASTY BAND (MVR);  Surgeon: Eugenio Hoes, MD;  Location: [facility] OR;  Service: Open Heart Surgery;  Laterality: N/A;   PACEMAKER IMPLANT N/A 06/03/2023   Procedure: PACEMAKER IMPLANT;  Surgeon: Marinus Maw, MD;  Location: [facility] INVASIVE CV LAB;  Service: Cardiovascular;  Laterality: N/A;   PROSTATECTOMY  2005   RIGHT/LEFT HEART CATH AND CORONARY ANGIOGRAPHY N/A 02/27/2023   Procedure: RIGHT/LEFT HEART CATH AND CORONARY ANGIOGRAPHY;  Surgeon: [name], MD;  Location: [facility] INVASIVE CV LAB;  Service: Cardiovascular;  Laterality: N/A;   TEE WITHOUT CARDIOVERSION N/A 02/07/2023   Procedure: TRANSESOPHAGEAL ECHOCARDIOGRAM;  Surgeon: Thurmon Fair, MD;  Location: [facility] INVASIVE CV LAB;  Service: Cardiovascular;  Laterality: N/A;   TEE WITHOUT CARDIOVERSION N/A 05/27/2023   Procedure: TRANSESOPHAGEAL ECHOCARDIOGRAM (TEE);  Surgeon: Eugenio Hoes, MD;  Location: [facility] OR;  Service: Open Heart Surgery;  Laterality: N/A;   TRANSESOPHAGEAL ECHOCARDIOGRAM (CATH LAB) N/A 06/03/2023   Procedure: TRANSESOPHAGEAL ECHOCARDIOGRAM;  Surgeon: Jodelle Red, MD;  Location: [facility] INVASIVE CV LAB;  Service: Cardiovascular;  Laterality: N/A;   TRICUSPID VALVE REPLACEMENT N/A 05/27/2023  Procedure: TRICUSPID VALVE REPAIR USING EDWARDS MC3 TRICUSPID ANNULOPLASTY RING;  Surgeon: Eugenio Hoes, MD;  Location: [facility] OR;  Service: Open Heart Surgery;  Laterality: N/A;    Allergies and medications reviewed and updated.   Current Outpatient Medications:    acetaminophen (TYLENOL) 325 MG tablet, Take 1-2 tablets (325-650 mg total) by mouth every 4 (four) hours as needed  for mild pain (pain score 1-3)., Disp: , Rfl:    amiodarone (PACERONE) 200 MG tablet, Take 1 tablet (200 mg total) by mouth daily., Disp: 30 tablet, Rfl: 0   apixaban (ELIQUIS) 2.5 MG TABS tablet, Take 1 tablet (2.5 mg total) by mouth 2 (two) times daily., Disp: 60 tablet, Rfl: 0   aspirin EC 81 MG tablet, Take 1 tablet (81 mg total) by mouth daily. Swallow whole., Disp: , Rfl:    atorvastatin (LIPITOR) 20 MG tablet, Take 1 tablet (20 mg total) by mouth daily., Disp: 30 tablet, Rfl: 0   cephALEXin (KEFLEX) 500 MG capsule, Take 1 capsule (500 mg total) by mouth 2 (two) times daily., Disp: 14 capsule, Rfl: 0   digoxin (LANOXIN) 0.125 MG tablet, Take 1 tablet (0.125 mg total) by mouth daily., Disp: 30 tablet, Rfl: 0   Fe Fum-Vit C-Vit B12-FA (TRIGELS-F FORTE) CAPS capsule, Take 1 capsule by mouth daily., Disp: 30 capsule, Rfl: 0   furosemide (LASIX) 20 MG tablet, Take 1 tablet (20 mg total) by mouth daily., Disp: 30 tablet, Rfl: 0   pantoprazole (PROTONIX) 40 MG tablet, Take 1 tablet (40 mg total) by mouth 2 (two) times daily., Disp: 60 tablet, Rfl: 0   potassium chloride SA (KLOR-CON M20) 20 MEQ tablet, Take 1 tablet (20 mEq total) by mouth daily., Disp: 15 tablet, Rfl: 0   Vitamin D, Ergocalciferol, (DRISDOL) 1.25 MG (50000 UNIT) CAPS capsule, Take 1 capsule (50,000 Units total) by mouth every 7 (seven) days., Disp: 5 capsule, Rfl: 0  No Known Allergies  Objective:   BP 132/78   Pulse 78   Temp 97.7 F (36.5 C)   Ht 5\' 10"  (1.778 m)   Wt 192 lb (87.1 kg)   SpO2 96%   BMI 27.55 kg/m      07/09/2023    4:09 PM 07/08/2023    1:56 PM 07/07/2023    9:56 AM  Vitals with BMI  Height 5\' 10"  5\' 10"  5\' 10"   Weight 192 lbs 198 lbs 196 lbs  BMI 27.55 28.41 28.12  Systolic 132 128 098  Diastolic 78 82 68  Pulse 78 87 109     Physical Exam Vitals and nursing note reviewed.  Constitutional:      Appearance: Normal appearance. He is normal weight.  HENT:     Head: Normocephalic and  atraumatic.  Abdominal:     General: Bowel sounds are normal. There is no distension.     Palpations: Abdomen is soft.     Tenderness: There is no abdominal tenderness. There is no right CVA tenderness or left CVA tenderness.  Skin:    General: Skin is warm and dry.     Capillary Refill: Capillary refill takes less than 2 seconds.  Neurological:     General: No focal deficit present.     Mental Status: He is alert and oriented to person, place, and time. Mental status is at baseline.  Psychiatric:        Mood and Affect: Mood normal.        Behavior: Behavior normal.  Thought Content: Thought content normal.        Judgment: Judgment normal.     Assessment & Plan:  Acute cystitis with hematuria Assessment & Plan: Urine dipstick shows positive for RBC's, positive for protein, positive for nitrates, and positive for leukocytes.  Micro exam: not done, Inadequate specimen. Will treat with Keflex 500mg  BID x7d. Return to office if symptoms persist or worsen.    Urinary frequency -     Urinalysis, Routine w reflex microscopic -     Urine Culture  Dysuria  Other orders -     Microscopic Message -     Cephalexin; Take 1 capsule (500 mg total) by mouth 2 (two) times daily.  Dispense: 14 capsule; Refill: 0     Follow up plan: No follow-ups on file.  Park Meo, FNP

## 2023-07-09 NOTE — Assessment & Plan Note (Deleted)
 Urine dipstick shows positive for RBC's, positive for protein, positive for nitrates, and positive for leukocytes.  Micro exam: not done, Inadequate specimen. Will treat with Keflex 500mg  BID x7d. Return to office if symptoms persist or worsen.

## 2023-07-09 NOTE — Assessment & Plan Note (Signed)
 Urine dipstick shows positive for RBC's, positive for protein, positive for nitrates, and positive for leukocytes.  Micro exam: not done, Inadequate specimen. Will treat with Keflex 500mg  BID x7d. Return to office if symptoms persist or worsen.

## 2023-07-10 ENCOUNTER — Encounter (HOSPITAL_COMMUNITY)

## 2023-07-10 ENCOUNTER — Other Ambulatory Visit

## 2023-07-10 ENCOUNTER — Telehealth (HOSPITAL_COMMUNITY): Payer: Self-pay

## 2023-07-10 NOTE — Telephone Encounter (Signed)
 Called to confirm/remind patient of their appointment at the [facility] on 07/11/2023 9:30.   Appointment:   [] Confirmed  [x] Left mess   [] No answer/No voice mail  [] Phone not in service  Patient reminded to bring all medications and/or complete list.  Confirmed patient has transportation. Gave directions, instructed to utilize valet parking.

## 2023-07-11 ENCOUNTER — Telehealth (HOSPITAL_COMMUNITY): Payer: Self-pay

## 2023-07-11 ENCOUNTER — Other Ambulatory Visit: Payer: Self-pay | Admitting: Family Medicine

## 2023-07-11 ENCOUNTER — Encounter (HOSPITAL_COMMUNITY)

## 2023-07-11 NOTE — Telephone Encounter (Signed)
 Called patient to see if he was interested in participating in the Cardiac Rehab Program. Patient will come in for orientation on 08/05/23 @ 1:15PM and will attend the 1:45PM exercise class.   Pensions consultant.

## 2023-07-14 ENCOUNTER — Telehealth: Payer: Self-pay | Admitting: Internal Medicine

## 2023-07-14 DIAGNOSIS — Z9889 Other specified postprocedural states: Secondary | ICD-10-CM | POA: Diagnosis not present

## 2023-07-14 DIAGNOSIS — E785 Hyperlipidemia, unspecified: Secondary | ICD-10-CM | POA: Diagnosis not present

## 2023-07-14 DIAGNOSIS — I340 Nonrheumatic mitral (valve) insufficiency: Secondary | ICD-10-CM | POA: Diagnosis not present

## 2023-07-14 DIAGNOSIS — I10 Essential (primary) hypertension: Secondary | ICD-10-CM | POA: Diagnosis not present

## 2023-07-14 DIAGNOSIS — I4821 Permanent atrial fibrillation: Secondary | ICD-10-CM | POA: Diagnosis not present

## 2023-07-14 LAB — URINE CULTURE
MICRO NUMBER:: 16279728
SPECIMEN QUALITY:: ADEQUATE

## 2023-07-14 NOTE — Telephone Encounter (Signed)
 Patient was recently prescribed a 1 week course of cephalexin and wanted to be sure it was OK for him to take this while also taking Lasix daily.  Cephalexin is 500 mg BID x 7 days Lasix is 20 mg daily  Last Creatinine 0.99 on 06/24/23 Last potassium 3.8 on 06/24/23 GFR >60 on 06/24/23  Will forward to Pharm D to review and advise on any contraindications/recommendations.

## 2023-07-14 NOTE — Telephone Encounter (Signed)
 Shared response from Pharm D with patient:  Renal function stable ok to take Cephalexin with current medications    Patient verbalized understanding and expressed appreciation for call.

## 2023-07-14 NOTE — Telephone Encounter (Signed)
 Pt c/o medication issue:  1. Name of Medication:   furosemide (LASIX) 20 MG tablet   2. How are you currently taking this medication (dosage and times per day)?     3. Are you having a reaction (difficulty breathing--STAT)?   4. What is your medication issue?   Patient stated he was prescribed antibiotics and wants to know if he should take furosemide with his antibiotic medication.

## 2023-07-14 NOTE — Telephone Encounter (Signed)
 Requested medication (s) are due for refill today - no  Requested medication (s) are on the active medication list -no  Future visit scheduled -no  Last refill: 09/04/22  Notes to clinic: non delegated Rx, no longer listed on current medication list   Requested Prescriptions  Pending Prescriptions Disp Refills   traMADol (ULTRAM) 50 MG tablet [Pharmacy Med Name: traMADol HCl 50 MG Oral Tablet] 120 tablet 0    Sig: TAKE 1 TABLET BY MOUTH EVERY 6 HOURS AS NEEDED     Not Delegated - Analgesics:  Opioid Agonists Failed - 07/14/2023  9:46 AM      Failed - This refill cannot be delegated      Failed - Urine Drug Screen completed in last 360 days      Failed - Valid encounter within last 3 months    Recent Outpatient Visits           5 days ago Acute cystitis with hematuria   [place] [facility] Park Meo, FNP   3 months ago Chronic diarrhea   [place] [facility] Tanya Nones, [name], MD   7 months ago Atrial fibrillation, unspecified type [facility])   [place] [facility] [name], [name], MD   1 year ago Atrial fibrillation, unspecified type [facility])   [place] [facility] [name], [name], MD   1 year ago Atrial fibrillation, unspecified type [facility])   [place] [facility] [name], [name], MD       Future Appointments             In 2 months [name], Charlies Constable, MD [facility]               Requested Prescriptions  Pending Prescriptions Disp Refills   traMADol (ULTRAM) 50 MG tablet [Pharmacy Med Name: traMADol HCl 50 MG Oral Tablet] 120 tablet 0    Sig: TAKE 1 TABLET BY MOUTH EVERY 6 HOURS AS NEEDED     Not Delegated - Analgesics:  Opioid Agonists Failed - 07/14/2023  9:46 AM      Failed - This refill cannot be delegated      Failed - Urine Drug Screen completed in last 360 days      Failed - Valid encounter within last 3 months     Recent Outpatient Visits           5 days ago Acute cystitis with hematuria   [place] [facility] Park Meo, FNP   3 months ago Chronic diarrhea   [place] [facility] Tanya Nones, [name], MD   7 months ago Atrial fibrillation, unspecified type [facility])   [place] [facility] [name], [name], MD   1 year ago Atrial fibrillation, unspecified type [facility])   [place] [facility] [name], [name], MD   1 year ago Atrial fibrillation, unspecified type [facility])    [facility] [name], [name], MD       Future Appointments             In 2 months [name], Charlies Constable, MD [facility] at [facility]

## 2023-07-15 ENCOUNTER — Other Ambulatory Visit: Payer: Self-pay

## 2023-07-15 ENCOUNTER — Other Ambulatory Visit: Payer: Self-pay | Admitting: Family Medicine

## 2023-07-15 LAB — CBC
Hematocrit: 39.9 % (ref 37.5–51.0)
Hemoglobin: 12.5 g/dL — ABNORMAL LOW (ref 13.0–17.7)
MCH: 27.8 pg (ref 26.6–33.0)
MCHC: 31.3 g/dL — ABNORMAL LOW (ref 31.5–35.7)
MCV: 89 fL (ref 79–97)
Platelets: 305 10*3/uL (ref 150–450)
RBC: 4.5 x10E6/uL (ref 4.14–5.80)
RDW: 14.1 % (ref 11.6–15.4)
WBC: 6.2 10*3/uL (ref 3.4–10.8)

## 2023-07-15 LAB — BASIC METABOLIC PANEL WITH GFR
BUN/Creatinine Ratio: 13 (ref 10–24)
BUN: 11 mg/dL (ref 8–27)
CO2: 21 mmol/L (ref 20–29)
Calcium: 8.4 mg/dL — ABNORMAL LOW (ref 8.6–10.2)
Chloride: 104 mmol/L (ref 96–106)
Creatinine, Ser: 0.84 mg/dL (ref 0.76–1.27)
Glucose: 111 mg/dL — ABNORMAL HIGH (ref 70–99)
Potassium: 3.6 mmol/L (ref 3.5–5.2)
Sodium: 140 mmol/L (ref 134–144)
eGFR: 88 mL/min/{1.73_m2} (ref >59)

## 2023-07-15 LAB — TSH: TSH: 2.03 u[IU]/mL (ref 0.450–4.500)

## 2023-07-15 LAB — PRO B NATRIURETIC PEPTIDE: NT-Pro BNP: 1069 pg/mL — ABNORMAL HIGH (ref 0–486)

## 2023-07-15 MED ORDER — SULFAMETHOXAZOLE-TRIMETHOPRIM 800-160 MG PO TABS
1.0000 | ORAL_TABLET | Freq: Two times a day (BID) | ORAL | 0 refills | Status: DC
Start: 2023-07-15 — End: 2023-11-06

## 2023-07-17 ENCOUNTER — Other Ambulatory Visit: Payer: Self-pay | Admitting: Thoracic Surgery (Cardiothoracic Vascular Surgery)

## 2023-07-17 DIAGNOSIS — Z9889 Other specified postprocedural states: Secondary | ICD-10-CM

## 2023-07-21 ENCOUNTER — Ambulatory Visit: Payer: Medicare Other

## 2023-07-21 DIAGNOSIS — I495 Sick sinus syndrome: Secondary | ICD-10-CM | POA: Diagnosis not present

## 2023-07-21 LAB — CUP PACEART REMOTE DEVICE CHECK
Battery Remaining Longevity: 77 mo
Battery Remaining Percentage: 95.5 %
Battery Voltage: 3.01 V
Brady Statistic RA Percent Paced: 20 %
Date Time Interrogation Session: 20250414020014
Implantable Lead Connection Status: 753985
Implantable Lead Implant Date: 20250225
Implantable Lead Location: 753859
Implantable Pulse Generator Implant Date: 20250225
Lead Channel Impedance Value: 380 Ohm
Lead Channel Pacing Threshold Amplitude: 0.75 V
Lead Channel Pacing Threshold Pulse Width: 0.4 ms
Lead Channel Sensing Intrinsic Amplitude: 1.8 mV
Lead Channel Setting Pacing Amplitude: 3.5 V
Pulse Gen Model: 2272
Pulse Gen Serial Number: 5998147

## 2023-07-22 ENCOUNTER — Encounter: Payer: Self-pay | Admitting: Internal Medicine

## 2023-07-23 ENCOUNTER — Other Ambulatory Visit: Payer: Self-pay | Admitting: Family Medicine

## 2023-07-23 ENCOUNTER — Ambulatory Visit (HOSPITAL_BASED_OUTPATIENT_CLINIC_OR_DEPARTMENT_OTHER)

## 2023-07-23 DIAGNOSIS — I361 Nonrheumatic tricuspid (valve) insufficiency: Secondary | ICD-10-CM

## 2023-07-23 DIAGNOSIS — Z9889 Other specified postprocedural states: Secondary | ICD-10-CM

## 2023-07-23 DIAGNOSIS — I4821 Permanent atrial fibrillation: Secondary | ICD-10-CM | POA: Diagnosis not present

## 2023-07-23 DIAGNOSIS — I340 Nonrheumatic mitral (valve) insufficiency: Secondary | ICD-10-CM | POA: Diagnosis not present

## 2023-07-23 DIAGNOSIS — I10 Essential (primary) hypertension: Secondary | ICD-10-CM

## 2023-07-23 DIAGNOSIS — I495 Sick sinus syndrome: Secondary | ICD-10-CM | POA: Diagnosis not present

## 2023-07-23 DIAGNOSIS — Z950 Presence of cardiac pacemaker: Secondary | ICD-10-CM

## 2023-07-23 DIAGNOSIS — E785 Hyperlipidemia, unspecified: Secondary | ICD-10-CM

## 2023-07-23 NOTE — Telephone Encounter (Unsigned)
 Copied from CRM [id number]. Topic: Clinical - Prescription Issue >> Jul 23, 2023 10:49 AM Juluis Ok wrote: Reason for CRM: Patient states that during 04/02 visit, medication, traMADol HCl (50 mg Oral Every 6 hours PRN), was to be called into his pharmacy. Patient states he spoke with pharmacy and medication was not sent. Patient request medication be sent to pharmacy.   [facility] [address], [place] - [address] [place] [address] Phone: [phone] Fax: [phone] Hours: Not open 24 hours

## 2023-07-24 ENCOUNTER — Encounter: Payer: Self-pay | Admitting: Thoracic Surgery (Cardiothoracic Vascular Surgery)

## 2023-07-24 ENCOUNTER — Ambulatory Visit (INDEPENDENT_AMBULATORY_CARE_PROVIDER_SITE_OTHER): Payer: Self-pay | Admitting: Thoracic Surgery (Cardiothoracic Vascular Surgery)

## 2023-07-24 ENCOUNTER — Ambulatory Visit
Admission: RE | Admit: 2023-07-24 | Discharge: 2023-07-24 | Disposition: A | Discharge status: Home / Self Care | Source: Ambulatory Visit | Attending: Thoracic Surgery (Cardiothoracic Vascular Surgery) | Admitting: Thoracic Surgery (Cardiothoracic Vascular Surgery)

## 2023-07-24 ENCOUNTER — Telehealth (HOSPITAL_COMMUNITY): Payer: Self-pay

## 2023-07-24 VITALS — BP 122/74 | HR 70 | Resp 20 | Ht 70.0 in | Wt 191.4 lb

## 2023-07-24 DIAGNOSIS — Z9889 Other specified postprocedural states: Secondary | ICD-10-CM

## 2023-07-24 LAB — ECHOCARDIOGRAM COMPLETE
AR max vel: 2.29 cm2
AV Area VTI: 2.3 cm2
AV Area mean vel: 2.28 cm2
AV Mean grad: 5 mmHg
AV Peak grad: 9.2 mmHg
Ao pk vel: 1.52 m/s
Area-P 1/2: 5.02 cm2
MV VTI: 2 cm2
S' Lateral: 3.35 cm

## 2023-07-24 NOTE — Telephone Encounter (Signed)
 Requested medications are due for refill today.  Pt has requested refill   Requested medications are on the active medications list.  no   Last refill. 09/04/2022    Future visit scheduled.   no   Notes to clinic.  Refill/refusal not delegated. Medication was d/c'd 06/24/2023.    Requested Prescriptions  Pending Prescriptions Disp Refills   traMADol (ULTRAM) 50 MG tablet [Pharmacy Med Name: traMADol HCl 50 MG Oral Tablet] 120 tablet 0    Sig: TAKE 1 TABLET BY MOUTH EVERY 6 HOURS AS NEEDED     Not Delegated - Analgesics:  Opioid Agonists Failed - 07/24/2023 11:52 AM      Failed - This refill cannot be delegated      Failed - Urine Drug Screen completed in last 360 days      Failed - Valid encounter within last 3 months    Recent Outpatient Visits           2 weeks ago Acute cystitis with hematuria   [place] [facility] Jenelle Mis, FNP   3 months ago Chronic diarrhea   [place] [address] Cheril Cork, Cisco Crest, MD   7 months ago Atrial fibrillation, unspecified type [facility])   [place] [facility] [name], Cisco Crest, MD   1 year ago Atrial fibrillation, unspecified type [facility])   [place] [facility] [name], Cisco Crest, MD   1 year ago Atrial fibrillation, unspecified type [facility])   [place] [facility] [name], Cisco Crest, MD       Future Appointments             In 2 months Lorie Rook, Arun K, MD [facility] at [facility]

## 2023-07-24 NOTE — Patient Instructions (Signed)
 Follow up prn

## 2023-07-24 NOTE — Telephone Encounter (Signed)
 Called to confirm/remind patient of their appointment at the [facility] on 07/25/2023 2:00.   Appointment:   [x] Confirmed  [] Left mess   [] No answer/No voice mail  [] VM Full/unable to leave message  [] Phone not in service  Patient reminded to bring all medications and/or complete list.  Confirmed patient has transportation. Gave directions, instructed to utilize valet parking.

## 2023-07-24 NOTE — Telephone Encounter (Signed)
 Requested medications are due for refill today.  Pt has requested refill  Requested medications are on the active medications list.  no  Last refill. 09/04/2022   Future visit scheduled.   no  Notes to clinic.  Refill/refusal not delegated. Medication was d/c'd 06/24/2023.    Requested Prescriptions  Pending Prescriptions Disp Refills   traMADol (ULTRAM) 50 MG tablet [Pharmacy Med Name: traMADol HCl 50 MG Oral Tablet] 120 tablet 0    Sig: TAKE 1 TABLET BY MOUTH EVERY 6 HOURS AS NEEDED     Not Delegated - Analgesics:  Opioid Agonists Failed - 07/24/2023 11:51 AM      Failed - This refill cannot be delegated      Failed - Urine Drug Screen completed in last 360 days      Failed - Valid encounter within last 3 months    Recent Outpatient Visits           2 weeks ago Acute cystitis with hematuria   [place] [facility] Jenelle Mis, FNP   3 months ago Chronic diarrhea   [place] [facility] Cheril Cork, Cisco Crest, MD   7 months ago Atrial fibrillation, unspecified type [facility])   [place] [facility] [name], Cisco Crest, MD   1 year ago Atrial fibrillation, unspecified type [facility])   [place] [facility] [name], Cisco Crest, MD   1 year ago Atrial fibrillation, unspecified type [facility])    [place] [facility] [name], Cisco Crest, MD       Future Appointments             In 2 months Lorie Rook, Arun K, MD [facility] at [facility]

## 2023-07-24 NOTE — Progress Notes (Signed)
 [address]       [place] [address]             [phone]           CARNEY SAXTON [facility] Medical Record #[id number] Date of Birth: [birth year]  [name] Pyo, MD [name], MD  Chief Complaint:  sp mitral tricuspid repair maze   History of Present Illness:     Pt is now 2 months from above. He feels he is doing better and planning on starting cardiac rehab tomorrow. He has some lower ext edema but no worse and feels like his energy level increasing daily. He is eating and sleeping well. No further GI bleeding issues. Seeing GI in June. Seeing Cards in May. Echo yesterday with good valve function and better EF.       Past Medical History:  Diagnosis Date   Arthritis    Back   CVA (cerebral infarction) 07/14/2009   TIA   Dysrhythmia    A. Fib   Heart murmur    History of kidney stones    Hyperlipidemia    Hypertension    Nephrolithiasis    Paroxysmal atrial fibrillation (HCC)    Paroxysmal atrial flutter (HCC)    a. s/p ablation 2012.   Prostate cancer [facility])    Stroke [facility])    TIA - no deficits    Past Surgical History:  Procedure Laterality Date   APPENDECTOMY  1967   BONE BIOPSY  06/14/2023   Procedure: BIOPSY,Colon;  Surgeon: Benancio Deeds, MD;  Location: [facility] ENDOSCOPY;  Service: Gastroenterology;;   CARDIOVERSION N/A 06/03/2023   Procedure: CARDIOVERSION;  Surgeon: Jodelle Red, MD;  Location: [facility] INVASIVE CV LAB;  Service: Cardiovascular;  Laterality: N/A;   CATARACT EXTRACTION W/ INTRAOCULAR LENS IMPLANT Bilateral    CERVICAL SPINE SURGERY     Cadaver Bone Placed   COLONOSCOPY     COLONOSCOPY N/A 06/14/2023   Procedure: COLONOSCOPY;  Surgeon: Benancio Deeds, MD;  Location: [facility] ENDOSCOPY;  Service: Gastroenterology;  Laterality: N/A;   CYSTOSCOPY/URETEROSCOPY/HOLMIUM LASER/STENT PLACEMENT Left 09/19/2020   Procedure: CYSTOSCOPY RETROGRADE PYELOGRAM URETEROSCOPY/HOLMIUM LASER/STENT PLACEMENT;  Surgeon:  Bjorn Pippin, MD;  Location: [facility] ORS;  Service: Urology;  Laterality: Left;   EYE SURGERY Bilateral    Eye Lid lifting   LUMBAR SPINE SURGERY     Ruptured Disc   MAZE N/A 05/27/2023   Procedure: MAZE;  Surgeon: Eugenio Hoes, MD;  Location: [facility] OR;  Service: Open Heart Surgery;  Laterality: N/A;   MITRAL VALVE REPAIR N/A 05/27/2023   Procedure: MITRAL VALVE REPAIR USING MEDTRONIC SIMULUS SEMI-RIDGID ANNULOPLASTY BAND (MVR);  Surgeon: Eugenio Hoes, MD;  Location: [facility] OR;  Service: Open Heart Surgery;  Laterality: N/A;   PACEMAKER IMPLANT N/A 06/03/2023   Procedure: PACEMAKER IMPLANT;  Surgeon: Marinus Maw, MD;  Location: [facility] INVASIVE CV LAB;  Service: Cardiovascular;  Laterality: N/A;   PROSTATECTOMY  2005   RIGHT/LEFT HEART CATH AND CORONARY ANGIOGRAPHY N/A 02/27/2023   Procedure: RIGHT/LEFT HEART CATH AND CORONARY ANGIOGRAPHY;  Surgeon: [name] Pyo, MD;  Location: [facility] INVASIVE CV LAB;  Service: Cardiovascular;  Laterality: N/A;   TEE WITHOUT CARDIOVERSION N/A 02/07/2023   Procedure: TRANSESOPHAGEAL ECHOCARDIOGRAM;  Surgeon: Thurmon Fair, MD;  Location: [facility] INVASIVE CV LAB;  Service: Cardiovascular;  Laterality: N/A;   TEE WITHOUT CARDIOVERSION N/A 05/27/2023   Procedure: TRANSESOPHAGEAL ECHOCARDIOGRAM (TEE);  Surgeon: Eugenio Hoes, MD;  Location: [facility] OR;  Service: Open  Heart Surgery;  Laterality: N/A;   TRANSESOPHAGEAL ECHOCARDIOGRAM (CATH LAB) N/A 06/03/2023   Procedure: TRANSESOPHAGEAL ECHOCARDIOGRAM;  Surgeon: Sheryle Donning, MD;  Location: [facility] INVASIVE CV LAB;  Service: Cardiovascular;  Laterality: N/A;   TRICUSPID VALVE REPLACEMENT N/A 05/27/2023   Procedure: TRICUSPID VALVE REPAIR USING EDWARDS MC3 TRICUSPID ANNULOPLASTY RING;  Surgeon: Melene Sportsman, MD;  Location: [facility] OR;  Service: Open Heart Surgery;  Laterality: N/A;    Social History   Tobacco Use  Smoking Status Never  Smokeless Tobacco Never    Social History   Substance and Sexual Activity  Alcohol Use  No    Social History   Socioeconomic History   Marital status: Married    Spouse name: Not on file   Number of children: 2   Years of education: Not on file   Highest education level: Not on file  Occupational History   Occupation: Personnel officer: [employer]  Tobacco Use   Smoking status: Never   Smokeless tobacco: Never  Vaping Use   Vaping status: Never Used  Substance and Sexual Activity   Alcohol use: No   Drug use: No   Sexual activity: Yes  Other Topics Concern   Not on file  Social History Narrative   Not on file   Social Drivers of Health   Financial Resource Strain: Low Risk  (06/18/2023)   Overall Financial Resource Strain (CARDIA)    Difficulty of Paying Living Expenses: Not very hard  Food Insecurity: No Food Insecurity (05/28/2023)   Hunger Vital Sign    Worried About Running Out of Food in the Last Year: Never true    Ran Out of Food in the Last Year: Never true  Transportation Needs: No Transportation Needs (06/18/2023)   PRAPARE - Administrator, Civil Service (Medical): No    Lack of Transportation (Non-Medical): No  Physical Activity: Insufficiently Active (10/31/2022)   Exercise Vital Sign    Days of Exercise per Week: 3 days    Minutes of Exercise per Session: 20 min  Stress: No Stress Concern Present (10/31/2022)   [employer] of Occupational Health - Occupational Stress Questionnaire    Feeling of Stress : Not at all  Social Connections: Socially Integrated (05/28/2023)   Social Connection and Isolation Panel [NHANES]    Frequency of Communication with Friends and Family: More than three times a week    Frequency of Social Gatherings with Friends and Family: Three times a week    Attends Religious Services: More than 4 times per year    Active Member of Clubs or Organizations: Yes    Attends Banker Meetings: 1 to 4 times per year    Marital Status: Married  Catering manager Violence: Not At Risk  (05/28/2023)   Humiliation, Afraid, Rape, and Kick questionnaire    Fear of Current or Ex-Partner: No    Emotionally Abused: No    Physically Abused: No    Sexually Abused: No    No Known Allergies  Current Outpatient Medications  Medication Sig Dispense Refill   acetaminophen (TYLENOL) 325 MG tablet Take 1-2 tablets (325-650 mg total) by mouth every 4 (four) hours as needed for mild pain (pain score 1-3).     amiodarone (PACERONE) 200 MG tablet Take 1 tablet (200 mg total) by mouth daily. 30 tablet 0   apixaban (ELIQUIS) 2.5 MG TABS tablet Take 1 tablet (2.5 mg total) by mouth 2 (two) times daily. 60 tablet  0   aspirin EC 81 MG tablet Take 1 tablet (81 mg total) by mouth daily. Swallow whole.     atorvastatin (LIPITOR) 20 MG tablet Take 1 tablet (20 mg total) by mouth daily. 30 tablet 0   cephALEXin (KEFLEX) 500 MG capsule Take 1 capsule (500 mg total) by mouth 2 (two) times daily. 14 capsule 0   digoxin (LANOXIN) 0.125 MG tablet Take 1 tablet (0.125 mg total) by mouth daily. 30 tablet 0   Fe Fum-Vit C-Vit B12-FA (TRIGELS-F FORTE) CAPS capsule Take 1 capsule by mouth daily. 30 capsule 0   furosemide (LASIX) 20 MG tablet Take 1 tablet (20 mg total) by mouth daily. 30 tablet 0   pantoprazole (PROTONIX) 40 MG tablet Take 1 tablet (40 mg total) by mouth 2 (two) times daily. 60 tablet 0   potassium chloride SA (KLOR-CON M20) 20 MEQ tablet Take 1 tablet (20 mEq total) by mouth daily. 15 tablet 0   sulfamethoxazole-trimethoprim (BACTRIM DS) 800-160 MG tablet Take 1 tablet by mouth 2 (two) times daily. 14 tablet 0   Vitamin D, Ergocalciferol, (DRISDOL) 1.25 MG (50000 UNIT) CAPS capsule Take 1 capsule (50,000 Units total) by mouth every 7 (seven) days. 5 capsule 0   No current facility-administered medications for this visit.     Family History  Problem Relation Age of Onset   Heart disease Mother 19       died   Prostate cancer Father        alive   Testicular cancer Brother        died        Physical Exam: Lungsl Clear Card: RR with no murmur Ext: trace edema Neuro intact     Diagnostic Studies & Laboratory data: I have personally reviewed the following studies and agree with the findings     Recent Radiology Findings:       Recent Lab Findings: Lab Results  Component Value Date   WBC 6.2 07/14/2023   HGB 12.5 (L) 07/14/2023   HCT 39.9 07/14/2023   PLT 305 07/14/2023   GLUCOSE 111 (H) 07/14/2023   CHOL 111 12/06/2022   TRIG 98 12/06/2022   HDL 28 (L) 12/06/2022   LDLCALC 65 12/06/2022   ALT 18 06/19/2023   AST 19 06/19/2023   NA 140 07/14/2023   K 3.6 07/14/2023   CL 104 07/14/2023   CREATININE 0.84 07/14/2023   BUN 11 07/14/2023   CO2 21 07/14/2023   TSH 2.030 07/14/2023   INR 3.4 (H) 06/09/2023   HGBA1C 5.8 (H) 05/23/2023      Assessment / Plan:     SP mitral and tricuspid repair and maze. Discussed his no longer restrictions and happy he is starting cardiac rehab. Will leave to cardiology to discuss stopping amio and diuretics. Hopefully at some time if maintains NSR to stop NOAC. Pt very pleased with his care   I have spent 20 min in review of the records, viewing studies and in face to face with patient and in coordination of future care    Melene Sportsman 07/24/2023 8:27 AM

## 2023-07-25 ENCOUNTER — Ambulatory Visit (HOSPITAL_COMMUNITY)
Admission: RE | Admit: 2023-07-25 | Discharge: 2023-07-25 | Disposition: A | Discharge status: Home / Self Care | Source: Ambulatory Visit | Attending: Physician Assistant | Admitting: Physician Assistant

## 2023-07-25 VITALS — BP 138/92 | HR 70 | Wt 190.4 lb

## 2023-07-25 DIAGNOSIS — Z79899 Other long term (current) drug therapy: Secondary | ICD-10-CM | POA: Insufficient documentation

## 2023-07-25 DIAGNOSIS — I4892 Unspecified atrial flutter: Secondary | ICD-10-CM | POA: Insufficient documentation

## 2023-07-25 DIAGNOSIS — R5383 Other fatigue: Secondary | ICD-10-CM | POA: Diagnosis not present

## 2023-07-25 DIAGNOSIS — I38 Endocarditis, valve unspecified: Secondary | ICD-10-CM

## 2023-07-25 DIAGNOSIS — I5022 Chronic systolic (congestive) heart failure: Secondary | ICD-10-CM | POA: Diagnosis not present

## 2023-07-25 DIAGNOSIS — I081 Rheumatic disorders of both mitral and tricuspid valves: Secondary | ICD-10-CM | POA: Insufficient documentation

## 2023-07-25 DIAGNOSIS — Z8546 Personal history of malignant neoplasm of prostate: Secondary | ICD-10-CM | POA: Diagnosis not present

## 2023-07-25 DIAGNOSIS — I5032 Chronic diastolic (congestive) heart failure: Secondary | ICD-10-CM

## 2023-07-25 DIAGNOSIS — Z8673 Personal history of transient ischemic attack (TIA), and cerebral infarction without residual deficits: Secondary | ICD-10-CM | POA: Diagnosis not present

## 2023-07-25 DIAGNOSIS — Z7901 Long term (current) use of anticoagulants: Secondary | ICD-10-CM | POA: Insufficient documentation

## 2023-07-25 DIAGNOSIS — I480 Paroxysmal atrial fibrillation: Secondary | ICD-10-CM | POA: Diagnosis not present

## 2023-07-25 DIAGNOSIS — Z87440 Personal history of urinary (tract) infections: Secondary | ICD-10-CM | POA: Insufficient documentation

## 2023-07-25 DIAGNOSIS — I428 Other cardiomyopathies: Secondary | ICD-10-CM | POA: Diagnosis not present

## 2023-07-25 DIAGNOSIS — Z950 Presence of cardiac pacemaker: Secondary | ICD-10-CM | POA: Insufficient documentation

## 2023-07-25 MED ORDER — APIXABAN 5 MG PO TABS: 5.0000 mg | ORAL_TABLET | Freq: Two times a day (BID) | ORAL | 0 refills | Status: DC

## 2023-07-25 MED ORDER — POTASSIUM CHLORIDE CRYS ER 20 MEQ PO TBCR: 20.0000 meq | EXTENDED_RELEASE_TABLET | Freq: Every day | ORAL | Status: DC | PRN

## 2023-07-25 MED ORDER — FUROSEMIDE 20 MG PO TABS: 20.0000 mg | ORAL_TABLET | Freq: Every day | ORAL | Status: DC | PRN

## 2023-07-25 MED ORDER — AMIODARONE HCL 200 MG PO TABS
200.0000 mg | ORAL_TABLET | Freq: Every day | ORAL | 0 refills | Status: DC
Start: 2023-07-25 — End: 2023-10-02

## 2023-07-25 NOTE — Patient Instructions (Addendum)
 Medication Changes:  STOP DIGOXIN    TAKE FUROSEMIDE  AND POTASSIUM DAILY ONLY AS NEEDED  INCREASE ELIQUIS  TO 5MG  TWICE DAILY   Lab Work:  RETURN FOR LABS AS SCHEDULED IN 2 WEEKS  WE WILL CALL YOU REGARDING STOPPING ASPIRIN   Follow-Up in: AS SCHEDULED WITH DR. THUKANNI  At the [facility], you and your health needs are our priority. We have a designated team specialized in the treatment of Heart Failure. This Care Team includes your primary Heart Failure Specialized Cardiologist (physician), Advanced Practice Providers (APPs- Physician Assistants and Nurse Practitioners), and Pharmacist who all work together to provide you with the care you need, when you need it.   You may see any of the following providers on your designated Care Team at your next follow up:  Dr. Toribio Fuel Dr. Ezra Shuck Dr. Ria Commander Dr. Odis Brownie Greig Mosses, NP Caffie Shed, [place] [facility] [place], [place] Beckey Coe, NP Jordan Lee, NP Tinnie Redman, PharmD   Please be sure to bring in all your medications bottles to every appointment.   Need to Contact Us :  If you have any questions or concerns before your next appointment please send us  a message through [place] or call our office at [phone].    TO LEAVE A MESSAGE FOR THE NURSE SELECT OPTION 2, PLEASE LEAVE A MESSAGE INCLUDING: YOUR NAME DATE OF BIRTH CALL BACK NUMBER REASON FOR CALL**this is important as we prioritize the call backs  YOU WILL RECEIVE A CALL BACK THE SAME DAY AS LONG AS YOU CALL BEFORE 4:00 PM

## 2023-07-25 NOTE — Addendum Note (Signed)
 Encounter addended by: Arleene Belt, PA-C on: 07/25/2023 4:08 PM  Actions taken: Clinical Note Signed

## 2023-07-25 NOTE — Progress Notes (Addendum)
 HEART & VASCULAR TRANSITION OF CARE CONSULT NOTE    Referring Physician: Dr. Honey Lusty PCP: Austine Lefort, MD  Cardiologist: Dr. Lorie Rook  Chief Complaint: Heart failure  HPI: Referred to clinic by Dr. Honey Lusty with [facility] for heart failure consultation.   [name] [name] is a 78 y.o. male with HF with recovered EF, valvular heart disease, PAF, AFL ablation 2012, hx TIA, chronic UTIs, prostate cancer s/p prostatectomy.   Admitted 2/18 for elective MVR, TVR and MAZE. Intra op TEE showed EF 45-50%, GHK, RV mildly enlarged. Post op he went back into symptomatic a fib requiring TEE/DCCV. TEE showed EF 35%, GHK, no thrombus seen, trivial TR/MR. He converted back to SB requiring pacing. Amiodarone  was stopped. EP was consulted and PPM placed.  He continued to have recurrent a fib RVR / ST during stay. Admission later complicated by GIB leading to hemorrhagic shock requiring pressor support and transfer to ICU, [facility] held. He received several uPRBCs. Underwent colonoscopy which showed suspected ischemic colitis as the cause of bleeding. Biopsies were taken.  Also treated with abx for possible diverticulitis.  Anticoagulation was resumed. He was discharged to CIR.   Echo 04/16: EF improved to 55-60%, grade III DD, RV okay, moderate BAE, stable MV repair with mild MR, stable TV repair  Saw Dr. Honey Lusty in clinic yesterday and appeared to be doing well.  Here today for HF TOC follow-up. Energy level slowly improving post discharge. Still notes some weakness and fatigue, planning to start cardiac rehab. No dyspnea, orthopnea or PND. Notes intermittent lower extremity edema. He is almost out of his medications and is wondering which cardiac ones he still needs to take. Not taking lasix  for about a week. He is currently on antibiotic for UTI and lasix  just exacerbates urinary symptoms.     Past Medical History:  Diagnosis Date   Arthritis    Back   CVA (cerebral infarction) 07/14/2009   TIA    Dysrhythmia    A. Fib   Heart murmur    History of kidney stones    Hyperlipidemia    Hypertension    Nephrolithiasis    Paroxysmal atrial fibrillation (HCC)    Paroxysmal atrial flutter (HCC)    a. s/p ablation 2012.   Prostate cancer (HCC)    Stroke (HCC)    TIA - no deficits    Current Outpatient Medications  Medication Sig Dispense Refill   acetaminophen  (TYLENOL ) 325 MG tablet Take 1-2 tablets (325-650 mg total) by mouth every 4 (four) hours as needed for mild pain (pain score 1-3).     aspirin  EC 81 MG tablet Take 1 tablet (81 mg total) by mouth daily. Swallow whole.     atorvastatin  (LIPITOR) 20 MG tablet Take 1 tablet (20 mg total) by mouth daily. 30 tablet 0   Fe Fum-Vit C-Vit B12-FA (TRIGELS-F FORTE) CAPS capsule Take 1 capsule by mouth daily. 30 capsule 0   pantoprazole  (PROTONIX ) 40 MG tablet Take 1 tablet (40 mg total) by mouth 2 (two) times daily. 60 tablet 0   sulfamethoxazole -trimethoprim  (BACTRIM  DS) 800-160 MG tablet Take 1 tablet by mouth 2 (two) times daily. 14 tablet 0   Vitamin D , Ergocalciferol , (DRISDOL ) 1.25 MG (50000 UNIT) CAPS capsule Take 1 capsule (50,000 Units total) by mouth every 7 (seven) days. 5 capsule 0   amiodarone  (PACERONE ) 200 MG tablet Take 1 tablet (200 mg total) by mouth daily. 90 tablet 0   apixaban  (ELIQUIS ) 5 MG TABS tablet Take 1  tablet (5 mg total) by mouth 2 (two) times daily. 180 tablet 0   furosemide  (LASIX ) 20 MG tablet Take 1 tablet (20 mg total) by mouth daily as needed.     potassium chloride  SA (KLOR-CON  M20) 20 MEQ tablet Take 1 tablet (20 mEq total) by mouth daily as needed.     No current facility-administered medications for this encounter.    No Known Allergies    Social History   Socioeconomic History   Marital status: Married    Spouse name: Not on file   Number of children: 2   Years of education: Not on file   Highest education level: Not on file  Occupational History   Occupation: Personnel officer:  [employer]  Tobacco Use   Smoking status: Never   Smokeless tobacco: Never  Vaping Use   Vaping status: Never Used  Substance and Sexual Activity   Alcohol use: No   Drug use: No   Sexual activity: Yes  Other Topics Concern   Not on file  Social History Narrative   Not on file   Social Drivers of Health   Financial Resource Strain: Low Risk  (06/18/2023)   Overall Financial Resource Strain (CARDIA)    Difficulty of Paying Living Expenses: Not very hard  Food Insecurity: No Food Insecurity (05/28/2023)   Hunger Vital Sign    Worried About Running Out of Food in the Last Year: Never true    Ran Out of Food in the Last Year: Never true  Transportation Needs: No Transportation Needs (06/18/2023)   PRAPARE - Administrator, Civil Service (Medical): No    Lack of Transportation (Non-Medical): No  Physical Activity: Insufficiently Active (10/31/2022)   Exercise Vital Sign    Days of Exercise per Week: 3 days    Minutes of Exercise per Session: 20 min  Stress: No Stress Concern Present (10/31/2022)   [employer] of Occupational Health - Occupational Stress Questionnaire    Feeling of Stress : Not at all  Social Connections: Socially Integrated (05/28/2023)   Social Connection and Isolation Panel [NHANES]    Frequency of Communication with Friends and Family: More than three times a week    Frequency of Social Gatherings with Friends and Family: Three times a week    Attends Religious Services: More than 4 times per year    Active Member of Clubs or Organizations: Yes    Attends Banker Meetings: 1 to 4 times per year    Marital Status: Married  Catering manager Violence: Not At Risk (05/28/2023)   Humiliation, Afraid, Rape, and Kick questionnaire    Fear of Current or Ex-Partner: No    Emotionally Abused: No    Physically Abused: No    Sexually Abused: No      Family History  Problem Relation Age of Onset   Heart disease Mother 52        died   Prostate cancer Father        alive   Testicular cancer Brother        died    Vitals:   Aug 20, 2023 1408  BP: (!) 138/92  Pulse: 70  SpO2: 96%  Weight: 86.4 kg (190 lb 6.4 oz)    PHYSICAL EXAM: General:  Well appearing. Ambulated into clinic. Neck: No JVD  Cor: Regular rate & rhythm. 2/6 systolic murmur present. Lungs: clear Abdomen: soft, nontender, nondistended.  Extremities: 1+ pretibial edema Neuro: alert & oriented  x 3. Affect pleasant.   ECG: A paced 70 bpm (Personally reviewed)     ASSESSMENT & PLAN: HFrEF with recovered EF - NICM. Mild CAD on cath 11/24. - Echo 11/24: EF 55%, severe BAE, severe MR, moderate TR - Mitral and tricuspid valve repair in 02/25. EF down post-op in setting of valvular disease and afib with RVR - TEE 06/03/23 EF 35%, GHK, no thrombus seen, trivial TR/MR - Echo 06/03/23 EF 30-35%, LV with LVH, RV mod reduced, LA/RA mod dilated - Echo 04/16: EF improved to 55-60%, grade III DD, RV okay, moderate BAE, stable MV repair with mild MR, stable TV repair - NYHA II/early III. Volume looks good. Can use lasix /KCL PRN.  - Has some lower extremity edema but suspect venous stasis contributing. Recommended compression stockings. - BP elevated. Start losartan  25 mg daily - Stop digoxin  as his EF has recovered - No SGLT2i for now with UTIs (currently being treated for active infection) - BMET 2 weeks  PAF/AFL - AFL ablation in 2012 - S/p MAZE 05/27/23 - Now with St Jude PPM - A paced today - Continue amiodarone  200 mg daily for now, can decide on further use at follow-up with Cardiology in June depending on Afib burden. - Currently on Eliquis  2.5 mg BID. He is > 82 years old, but Scr < 1.5 and weight >60 kg, so will increase dose to 5 mg BID. Hgb stable on labs earlier this month.  Valvular heart disease - S/p mitral and tricuspid valve repairs in 02/25 - Stable on echo a couple of days ago - Will discuss with [facility] if okay to discontinue  aspirin  - Followed by [facility]  4. Hx Symptomatic bradycardia  - 2/2 sinus node dysfunction - Now s/p st jude single chamber pacemaker  5. TIA 2011 - Thought to be 2/2 atrial flutter  Referred to HFSW (PCP, Medications, Transportation, ETOH Abuse, Drug Abuse, Insurance, Surveyor, quantity ): \No Refer to Pharmacy: No Refer to Home Health: No Refer to [facility]: No Refer to General Cardiology: No, already established  Follow up  PRN, keep follow-up with Cardiology in June

## 2023-08-04 ENCOUNTER — Telehealth: Payer: Self-pay | Admitting: Internal Medicine

## 2023-08-04 ENCOUNTER — Encounter: Payer: Self-pay | Admitting: Internal Medicine

## 2023-08-04 DIAGNOSIS — N390 Urinary tract infection, site not specified: Secondary | ICD-10-CM | POA: Diagnosis not present

## 2023-08-04 DIAGNOSIS — N200 Calculus of kidney: Secondary | ICD-10-CM | POA: Diagnosis not present

## 2023-08-04 DIAGNOSIS — N393 Stress incontinence (female) (male): Secondary | ICD-10-CM | POA: Diagnosis not present

## 2023-08-04 NOTE — Telephone Encounter (Signed)
 Left voicemail to return call to office

## 2023-08-04 NOTE — Telephone Encounter (Signed)
 Patient is requesting to speak with a nurse in regard to which medications he should be taking. Please advise.

## 2023-08-05 ENCOUNTER — Encounter (HOSPITAL_COMMUNITY)
Admission: RE | Admit: 2023-08-05 | Discharge: 2023-08-05 | Disposition: A | Discharge status: Home / Self Care | Source: Ambulatory Visit | Attending: Internal Medicine | Admitting: Internal Medicine

## 2023-08-05 VITALS — BP 122/78 | HR 69 | Ht 70.0 in | Wt 189.6 lb

## 2023-08-05 DIAGNOSIS — Z9889 Other specified postprocedural states: Secondary | ICD-10-CM | POA: Diagnosis not present

## 2023-08-05 NOTE — Progress Notes (Signed)
 Cardiac Rehab Medication Review   Does the patient  feel that his/her medications are working for him/her?  yes  Has the patient been experiencing any side effects to the medications prescribed?  no  Does the patient measure his/her own blood pressure or blood glucose at home?  He checks his blood pressure occasionally.   Does the patient have any problems obtaining medications due to transportation or finances?   no  Understanding of regimen: good Understanding of indications: good Potential of compliance:  Mr. Popescu is not taking several of his medications. He's sent a MyChart message to his physician to see if he needs to resume including atorvastatin , aspirin , and protonix .  He will update us  when he hears back if he starts taking these medications.    Comments: He is taking some of his medications differently than prescribed. He is currently only taking amiodarone , eliquis , tylenol  as needed for pain, lasix  as needed, and potassium when he takes his lasix . He wasn't sure what the amiodarone  was for, so I explained it's indication.    Hilbert Loving 08/05/2023 1:35 PM

## 2023-08-05 NOTE — Progress Notes (Signed)
 Cardiac Individual Treatment Plan  Patient Details  Name: [name] [name] MRN: [id number] Date of Birth: August 03, 1945 Referring Provider:   Flowsheet Row INTENSIVE CARDIAC REHAB ORIENT from 08/05/2023 in [facility]  Referring Provider Kyra Phy, MD       Initial Encounter Date:  Flowsheet Row INTENSIVE CARDIAC REHAB ORIENT from 08/05/2023 in [facility] for Heart, Vascular, & Lung Health  Date 08/05/23       Visit Diagnosis: 05/27/23 tricuspid valve repair  05/27/23 mitral valve repair  Patient's Home Medications on Admission:  Current Outpatient Medications:    acetaminophen  (TYLENOL ) 325 MG tablet, Take 1-2 tablets (325-650 mg total) by mouth every 4 (four) hours as needed for mild pain (pain score 1-3)., Disp: , Rfl:    amiodarone  (PACERONE ) 200 MG tablet, Take 1 tablet (200 mg total) by mouth daily., Disp: 90 tablet, Rfl: 0   apixaban  (ELIQUIS ) 5 MG TABS tablet, Take 1 tablet (5 mg total) by mouth 2 (two) times daily., Disp: 180 tablet, Rfl: 0   furosemide  (LASIX ) 20 MG tablet, Take 1 tablet (20 mg total) by mouth daily as needed., Disp: , Rfl:    potassium chloride  SA (KLOR-CON  M20) 20 MEQ tablet, Take 1 tablet (20 mEq total) by mouth daily as needed., Disp: , Rfl:    aspirin  EC 81 MG tablet, Take 1 tablet (81 mg total) by mouth daily. Swallow whole. (Patient not taking: Reported on 08/05/2023), Disp: , Rfl:    atorvastatin  (LIPITOR) 20 MG tablet, Take 1 tablet (20 mg total) by mouth daily. (Patient not taking: Reported on 08/05/2023), Disp: 30 tablet, Rfl: 0   Fe Fum-Vit C-Vit B12-FA (TRIGELS-F FORTE) CAPS capsule, Take 1 capsule by mouth daily. (Patient not taking: Reported on 08/05/2023), Disp: 30 capsule, Rfl: 0   pantoprazole  (PROTONIX ) 40 MG tablet, Take 1 tablet (40 mg total) by mouth 2 (two) times daily. (Patient not taking: Reported on 08/05/2023), Disp: 60 tablet, Rfl: 0    sulfamethoxazole -trimethoprim  (BACTRIM  DS) 800-160 MG tablet, Take 1 tablet by mouth 2 (two) times daily. (Patient not taking: Reported on 08/05/2023), Disp: 14 tablet, Rfl: 0   Vitamin D , Ergocalciferol , (DRISDOL ) 1.25 MG (50000 UNIT) CAPS capsule, Take 1 capsule (50,000 Units total) by mouth every 7 (seven) days. (Patient not taking: Reported on 08/05/2023), Disp: 5 capsule, Rfl: 0  Past Medical History: Past Medical History:  Diagnosis Date   Arthritis    Back   CVA (cerebral infarction) 07/14/2009   TIA   Dysrhythmia    A. Fib   Heart murmur    History of kidney stones    Hyperlipidemia    Hypertension    Nephrolithiasis    Paroxysmal atrial fibrillation (HCC)    Paroxysmal atrial flutter (HCC)    a. s/p ablation 2012.   Prostate cancer (HCC)    Stroke (HCC)    TIA - no deficits    Tobacco Use: Social History   Tobacco Use  Smoking Status Never  Smokeless Tobacco Never    Labs: Review Flowsheet  More data exists      Latest Ref Rng & Units 05/27/2023 05/28/2023 05/30/2023 05/31/2023 06/01/2023  Labs for ITP Cardiac and Pulmonary Rehab  PH, Arterial 7.35 - 7.45 7.314  7.263  7.296  7.375  7.407  7.324  - - - 7.510   PCO2 arterial 32 - 48 mmHg 41.6  38.0  40.9  37.6  38.3  45.6  - - -  41.1   Bicarbonate 20.0 - 28.0 mmol/L 21.3  17.3  20.1  22.0  24.1  23.7  - - - 32.8   TCO2 22 - 32 mmol/L 23  18  21  18  23  25  28  26  25  25  25  24   - - 34   Acid-base deficit 0.0 - 2.0 mmol/L 5.0  9.0  6.0  3.0  3.0  - - - -  O2 Saturation % 90  98  94  99  100  100  - 68.3  63.4  94     Details       Multiple values from one day are sorted in reverse-chronological order         Capillary Blood Glucose: Lab Results  Component Value Date   GLUCAP 73 06/16/2023   GLUCAP 93 06/15/2023   GLUCAP 99 06/15/2023   GLUCAP 126 (H) 06/15/2023   GLUCAP 134 (H) 06/15/2023     Exercise Target Goals: Exercise Program Goal: Individual exercise prescription set using results from  initial 6 min walk test and THRR while considering  patient's activity barriers and safety.   Exercise Prescription Goal: Initial exercise prescription builds to 30-45 minutes a day of aerobic activity, 2-3 days per week.  Home exercise guidelines will be given to patient during program as part of exercise prescription that the participant will acknowledge.  Activity Barriers & Risk Stratification:  Activity Barriers & Cardiac Risk Stratification - 08/05/23 1342       Activity Barriers & Cardiac Risk Stratification   Activity Barriers None    Cardiac Risk Stratification Low             6 Minute Walk:  6 Minute Walk     Row Name 08/05/23 1423         6 Minute Walk   Phase Initial     Distance 1282 feet     Walk Time 6 minutes     # of Rest Breaks 0     MPH 2.43     METS 2.34     RPE 9     Perceived Dyspnea  9     VO2 Peak 8.2     Symptoms Yes (comment)     Comments Mild shortness of breath.     Resting HR 69 bpm     Resting BP 122/78     Resting Oxygen  Saturation  98 %     Exercise Oxygen  Saturation  during 6 min walk 97 %     Max Ex. HR 84 bpm     Max Ex. BP 148/82     2 Minute Post BP 142/84              Oxygen  Initial Assessment:   Oxygen  Re-Evaluation:   Oxygen  Discharge (Final Oxygen  Re-Evaluation):   Initial Exercise Prescription:  Initial Exercise Prescription - 08/05/23 1500       Date of Initial Exercise RX and Referring Provider   Date 08/05/23    Referring Provider Thukkani, Arun K, MD    Expected Discharge Date 10/29/23      Recumbant Bike   Level 1    Watts 10    Minutes 15    METs 2.2      NuStep   Level 1    SPM 85    Minutes 15    METs 2.2      Prescription Details   Frequency (times per week) 3  Duration Progress to 30 minutes of continuous aerobic without signs/symptoms of physical distress      Intensity   THRR 40-80% of Max Heartrate 56-112    Ratings of Perceived Exertion 11-13      Progression    Progression Continue to progress workloads to maintain intensity without signs/symptoms of physical distress.      Resistance Training   Training Prescription Yes    Weight 3 lbs    Reps 10-15             Perform Capillary Blood Glucose checks as needed.  Exercise Prescription Changes:   Exercise Comments:   Exercise Goals and Review:   Exercise Goals     Row Name 08/05/23 1342             Exercise Goals   Increase Physical Activity Yes       Intervention Provide advice, education, support and counseling about physical activity/exercise needs.;Develop an individualized exercise prescription for aerobic and resistive training based on initial evaluation findings, risk stratification, comorbidities and participant's personal goals.       Expected Outcomes Short Term: Attend rehab on a regular basis to increase amount of physical activity.;Long Term: Exercising regularly at least 3-5 days a week.;Long Term: Add in home exercise to make exercise part of routine and to increase amount of physical activity.       Increase Strength and Stamina Yes       Intervention Provide advice, education, support and counseling about physical activity/exercise needs.;Develop an individualized exercise prescription for aerobic and resistive training based on initial evaluation findings, risk stratification, comorbidities and participant's personal goals.       Expected Outcomes Short Term: Increase workloads from initial exercise prescription for resistance, speed, and METs.;Short Term: Perform resistance training exercises routinely during rehab and add in resistance training at home;Long Term: Improve cardiorespiratory fitness, muscular endurance and strength as measured by increased METs and functional capacity ( )       Able to understand and use rate of perceived exertion (RPE) scale Yes       Intervention Provide education and explanation on how to use RPE scale       Expected Outcomes  Short Term: Able to use RPE daily in rehab to express subjective intensity level;Long Term:  Able to use RPE to guide intensity level when exercising independently       Knowledge and understanding of Target Heart Rate Range (THRR) Yes       Intervention Provide education and explanation of THRR including how the numbers were predicted and where they are located for reference       Expected Outcomes Short Term: Able to state/look up THRR;Long Term: Able to use THRR to govern intensity when exercising independently;Short Term: Able to use daily as guideline for intensity in rehab       Able to check pulse independently Yes       Intervention Provide education and demonstration on how to check pulse in carotid and radial arteries.;Review the importance of being able to check your own pulse for safety during independent exercise       Expected Outcomes Short Term: Able to explain why pulse checking is important during independent exercise;Long Term: Able to check pulse independently and accurately       Understanding of Exercise Prescription Yes       Intervention Provide education, explanation, and written materials on patient's individual exercise prescription       Expected Outcomes Short Term:  Able to explain program exercise prescription;Long Term: Able to explain home exercise prescription to exercise independently                Exercise Goals Re-Evaluation :   Discharge Exercise Prescription (Final Exercise Prescription Changes):   Nutrition:  Target Goals: Understanding of nutrition guidelines, daily intake of sodium 1500mg , cholesterol 200mg , calories 30% from fat and 7% or less from saturated fats, daily to have 5 or more servings of fruits and vegetables.  Biometrics:  Pre Biometrics - 08/05/23 1254       Pre Biometrics   Waist Circumference 40.75 inches    Hip Circumference 40.25 inches    Waist to Hip Ratio 1.01 %    Triceps Skinfold 18 mm    % Body Fat 28.7 %    Grip  Strength 34 kg    Flexibility 0 in   knees bent   Single Leg Stand 30 seconds              Nutrition Therapy Plan and Nutrition Goals:   Nutrition Assessments:  MEDIFICTS Score Key: >=70 Need to make dietary changes  40-70 Heart Healthy Diet <= 40 Therapeutic Level Cholesterol Diet    Picture Your Plate Scores: <38 Unhealthy dietary pattern with much room for improvement. 41-50 Dietary pattern unlikely to meet recommendations for good health and room for improvement. 51-60 More healthful dietary pattern, with some room for improvement.  >60 Healthy dietary pattern, although there may be some specific behaviors that could be improved.    Nutrition Goals Re-Evaluation:   Nutrition Goals Re-Evaluation:   Nutrition Goals Discharge (Final Nutrition Goals Re-Evaluation):   Psychosocial: Target Goals: Acknowledge presence or absence of significant depression and/or stress, maximize coping skills, provide positive support system. Participant is able to verbalize types and ability to use techniques and skills needed for reducing stress and depression.  Initial Review & Psychosocial Screening:  Initial Psych Review & Screening - 08/05/23 1344       Initial Review   Current issues with None Identified      Family Dynamics   Good Support System? Yes    Comments Mr. Kiyabu has support from his wife.      Barriers   Psychosocial barriers to participate in program There are no identifiable barriers or psychosocial needs.      Screening Interventions   Interventions Encouraged to exercise;Provide feedback about the scores to participant    Expected Outcomes Short Term goal: Identification and review with participant of any Quality of Life or Depression concerns found by scoring the questionnaire.;Long Term goal: The participant improves quality of Life and PHQ9 Scores as seen by post scores and/or verbalization of changes             Quality of Life Scores:  Quality of  Life - 08/05/23 1516       Quality of Life   Select Quality of Life      Quality of Life Scores   Health/Function Pre 28 %    Socioeconomic Pre 29.69 %    Psych/Spiritual Pre 29.14 %    Family Pre 24 %    GLOBAL Pre 28.29 %            Scores of 19 and below usually indicate a poorer quality of life in these areas.  A difference of  2-3 points is a clinically meaningful difference.  A difference of 2-3 points in the total score of the Quality of Life Index has been  associated with significant improvement in overall quality of life, self-image, physical symptoms, and general health in studies assessing change in quality of life.  PHQ-9: Review Flowsheet  More data exists      08/05/2023 07/09/2023 04/07/2023 10/31/2022 05/20/2022  Depression screen PHQ 2/9  Decreased Interest 0 0 0 0 0 0  Down, Depressed, Hopeless 0 0 0 0 0 0  PHQ - 2 Score 0 0 0 0 0 0  Altered sleeping 1 0 - - -  Tired, decreased energy 1 1 - - -  Change in appetite 0 0 - - -  Feeling bad or failure about yourself  0 0 - - -  Trouble concentrating 0 0 - - -  Moving slowly or fidgety/restless 0 0 - - -  Suicidal thoughts 0 0 - - -  PHQ-9 Score 2 1 - - -    Details       Multiple values from one day are sorted in reverse-chronological order        Interpretation of Total Score  Total Score Depression Severity:  1-4 = Minimal depression, 5-9 = Mild depression, 10-14 = Moderate depression, 15-19 = Moderately severe depression, 20-27 = Severe depression   Psychosocial Evaluation and Intervention:   Psychosocial Re-Evaluation:   Psychosocial Discharge (Final Psychosocial Re-Evaluation):   Vocational Rehabilitation: Provide vocational rehab assistance to qualifying candidates.   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            Education: Education Goals: Education classes will be provided on a weekly basis, covering required topics. Participant will state understanding/return demonstration of topics presented.     Core Videos: Exercise    Move It!  Clinical staff conducted group or individual video education with verbal and written material and guidebook.  Patient learns the recommended Pritikin exercise program. Exercise with the goal of living a long, healthy life. Some of the health benefits of exercise include controlled diabetes, healthier blood pressure levels, improved cholesterol levels, improved heart and lung capacity, improved sleep, and better body composition. Everyone should speak with their doctor before starting or changing an exercise routine.  Biomechanical Limitations Clinical staff conducted group or individual video education with verbal and written material and guidebook.  Patient learns how biomechanical limitations can impact exercise and how we can mitigate and possibly overcome limitations to have an impactful and balanced exercise routine.  Body Composition Clinical staff conducted group or individual video education with verbal and written material and guidebook.  Patient learns that body composition (ratio of muscle mass to fat mass) is a key component to assessing overall fitness, rather than body weight alone. Increased fat mass, especially visceral belly fat, can put us  at increased risk for metabolic syndrome, type 2 diabetes, heart disease, and even death. It is recommended to combine diet and exercise (cardiovascular and resistance training) to improve your body composition. Seek guidance from your physician and exercise physiologist before implementing an exercise routine.  Exercise Action Plan Clinical staff conducted group or individual video education with verbal and written material and guidebook.   Patient learns the recommended strategies to achieve and enjoy long-term exercise adherence, including variety, self-motivation, self-efficacy, and positive decision making. Benefits of exercise include fitness, good health, weight  management, more energy, better sleep, less stress, and overall well-being.  Medical   Heart Disease Risk Reduction Clinical staff conducted group or individual video education with verbal and written material and guidebook.  Patient learns our heart is our most vital organ as it circulates oxygen , nutrients, white blood cells, and hormones throughout the entire body, and carries waste away. Data supports a plant-based eating plan like the Pritikin Program for its effectiveness in slowing progression of and reversing heart disease. The video provides a number of recommendations to address heart disease.   Metabolic Syndrome and Belly Fat  Clinical staff conducted group or individual video education with verbal and written material and guidebook.  Patient learns what metabolic syndrome is, how it leads to heart disease, and how one can reverse it and keep it from coming back. You have metabolic syndrome if you have 3 of the following 5 criteria: abdominal obesity, high blood pressure, high triglycerides, low HDL cholesterol, and high blood sugar.  Hypertension and Heart Disease Clinical staff conducted group or individual video education with verbal and written material and guidebook.  Patient learns that high blood pressure, or hypertension, is very common in the [place] . Hypertension is largely due to excessive salt intake, but other important risk factors include being overweight, physical inactivity, drinking too much alcohol, smoking, and not eating enough potassium from fruits and vegetables. High blood pressure is a leading risk factor for heart attack, stroke, congestive heart failure, dementia, kidney failure, and premature death. Long-term effects of  excessive salt intake include stiffening of the arteries and thickening of heart muscle and organ damage. Recommendations include ways to reduce hypertension and the risk of heart disease.  Diseases of Our Time - Focusing on Diabetes Clinical staff conducted group or individual video education with verbal and written material and guidebook.  Patient learns why the best way to stop diseases of our time is prevention, through food and other lifestyle changes. Medicine (such as prescription pills and surgeries) is often only a Band-Aid on the problem, not a long-term solution. Most common diseases of our time include obesity, type 2 diabetes, hypertension, heart disease, and cancer. The Pritikin Program is recommended and has been proven to help reduce, reverse, and/or prevent the damaging effects of metabolic syndrome.  Nutrition   Overview of the Pritikin Eating Plan  Clinical staff conducted group or individual video education with verbal and written material and guidebook.  Patient learns about the Pritikin Eating Plan for disease risk reduction. The Pritikin Eating Plan emphasizes a wide variety of unrefined, minimally-processed carbohydrates, like fruits, vegetables, whole grains, and legumes. Go, Caution, and Stop food choices are explained. Plant-based and lean animal proteins are emphasized. Rationale provided for low sodium intake for blood pressure control, low added sugars for blood sugar stabilization, and low added fats and oils for coronary artery disease risk reduction and weight management.  Calorie Density  Clinical staff conducted group or individual video education with verbal and written material and guidebook.  Patient learns about calorie density and how it impacts the Pritikin Eating Plan. Knowing the characteristics of the food you choose will help you decide whether those foods will lead to weight gain or weight loss, and whether you want to consume more or less of them. Weight  loss is usually a side effect of the Pritikin Eating Plan because of its focus on low calorie-dense foods.  Label Reading  Clinical staff conducted group or individual video education with verbal and written material and  guidebook.  Patient learns about the Pritikin recommended label reading guidelines and corresponding recommendations regarding calorie density, added sugars, sodium content, and whole grains.  Dining Out - Part 1  Clinical staff conducted group or individual video education with verbal and written material and guidebook.  Patient learns that restaurant meals can be sabotaging because they can be so high in calories, fat, sodium, and/or sugar. Patient learns recommended strategies on how to positively address this and avoid unhealthy pitfalls.  Facts on Fats  Clinical staff conducted group or individual video education with verbal and written material and guidebook.  Patient learns that lifestyle modifications can be just as effective, if not more so, as many medications for lowering your risk of heart disease. A Pritikin lifestyle can help to reduce your risk of inflammation and atherosclerosis (cholesterol build-up, or plaque, in the artery walls). Lifestyle interventions such as dietary choices and physical activity address the cause of atherosclerosis. A review of the types of fats and their impact on blood cholesterol levels, along with dietary recommendations to reduce fat intake is also included.  Nutrition Action Plan  Clinical staff conducted group or individual video education with verbal and written material and guidebook.  Patient learns how to incorporate Pritikin recommendations into their lifestyle. Recommendations include planning and keeping personal health goals in mind as an important part of their success.  Healthy Mind-Set    Healthy Minds, Bodies, Hearts  Clinical staff conducted group or individual video education with verbal and written material and  guidebook.  Patient learns how to identify when they are stressed. Video will discuss the impact of that stress, as well as the many benefits of stress management. Patient will also be introduced to stress management techniques. The way we think, act, and feel has an impact on our hearts.  How Our Thoughts Can Heal Our Hearts  Clinical staff conducted group or individual video education with verbal and written material and guidebook.  Patient learns that negative thoughts can cause depression and anxiety. This can result in negative lifestyle behavior and serious health problems. Cognitive behavioral therapy is an effective method to help control our thoughts in order to change and improve our emotional outlook.  Additional Videos:  Exercise    Improving Performance  Clinical staff conducted group or individual video education with verbal and written material and guidebook.  Patient learns to use a non-linear approach by alternating intensity levels and lengths of time spent exercising to help burn more calories and lose more body fat. Cardiovascular exercise helps improve heart health, metabolism, hormonal balance, blood sugar control, and recovery from fatigue. Resistance training improves strength, endurance, balance, coordination, reaction time, metabolism, and muscle mass. Flexibility exercise improves circulation, posture, and balance. Seek guidance from your physician and exercise physiologist before implementing an exercise routine and learn your capabilities and proper form for all exercise.  Introduction to Yoga  Clinical staff conducted group or individual video education with verbal and written material and guidebook.  Patient learns about yoga, a discipline of the coming together of mind, breath, and body. The benefits of yoga include improved flexibility, improved range of motion, better posture and core strength, increased lung function, weight loss, and positive self-image. Yoga's  heart health benefits include lowered blood pressure, healthier heart rate, decreased cholesterol and triglyceride levels, improved immune function, and reduced stress. Seek guidance from your physician and exercise physiologist before implementing an exercise routine and learn your capabilities and proper form for all exercise.  Medical  Aging: Manufacturing systems engineer of [employer]  Clinical staff conducted group or individual video education with verbal and written material and guidebook.  Patient learns key strategies and recommendations to stay in good physical health and enhance quality of life, such as prevention strategies, having an advocate, securing a Health Care Proxy and Power of Attorney, and keeping a list of medications and system for tracking them. It also discusses how to avoid risk for bone loss.  Biology of Weight Control  Clinical staff conducted group or individual video education with verbal and written material and guidebook.  Patient learns that weight gain occurs because we consume more calories than we burn (eating more, moving less). Even if your body weight is normal, you may have higher ratios of fat compared to muscle mass. Too much body fat puts you at increased risk for cardiovascular disease, heart attack, stroke, type 2 diabetes, and obesity-related cancers. In addition to exercise, following the Pritikin Eating Plan can help reduce your risk.  Decoding Lab Results  Clinical staff conducted group or individual video education with verbal and written material and guidebook.  Patient learns that lab test reflects one measurement whose values change over time and are influenced by many factors, including medication, stress, sleep, exercise, food, hydration, pre-existing medical conditions, and more. It is recommended to use the knowledge from this video to become more involved with your lab results and evaluate your numbers to speak with your doctor.   Diseases of Our Time -  Overview  Clinical staff conducted group or individual video education with verbal and written material and guidebook.  Patient learns that according to the CDC, 50% to 70% of chronic diseases (such as obesity, type 2 diabetes, elevated lipids, hypertension, and heart disease) are avoidable through lifestyle improvements including healthier food choices, listening to satiety cues, and increased physical activity.  Sleep Disorders Clinical staff conducted group or individual video education with verbal and written material and guidebook.  Patient learns how good quality and duration of sleep are important to overall health and well-being. Patient also learns about sleep disorders and how they impact health along with recommendations to address them, including discussing with a physician.  Nutrition  Dining Out - Part 2 Clinical staff conducted group or individual video education with verbal and written material and guidebook.  Patient learns how to plan ahead and communicate in order to maximize their dining experience in a healthy and nutritious manner. Included are recommended food choices based on the type of restaurant the patient is visiting.   Fueling a Banker conducted group or individual video education with verbal and written material and guidebook.  There is a strong connection between our food choices and our health. Diseases like obesity and type 2 diabetes are very prevalent and are in large-part due to lifestyle choices. The Pritikin Eating Plan provides plenty of food and hunger-curbing satisfaction. It is easy to follow, affordable, and helps reduce health risks.  Menu Workshop  Clinical staff conducted group or individual video education with verbal and written material and guidebook.  Patient learns that restaurant meals can sabotage health goals because they are often packed with calories, fat, sodium, and sugar. Recommendations include strategies to plan  ahead and to communicate with the manager, chef, or server to help order a healthier meal.  Planning Your Eating Strategy  Clinical staff conducted group or individual video education with verbal and written material and guidebook.  Patient learns about the Pritikin Eating Plan  and its benefit of reducing the risk of disease. The Pritikin Eating Plan does not focus on calories. Instead, it emphasizes high-quality, nutrient-rich foods. By knowing the characteristics of the foods, we choose, we can determine their calorie density and make informed decisions.  Targeting Your Nutrition Priorities  Clinical staff conducted group or individual video education with verbal and written material and guidebook.  Patient learns that lifestyle habits have a tremendous impact on disease risk and progression. This video provides eating and physical activity recommendations based on your personal health goals, such as reducing LDL cholesterol, losing weight, preventing or controlling type 2 diabetes, and reducing high blood pressure.  Vitamins and Minerals  Clinical staff conducted group or individual video education with verbal and written material and guidebook.  Patient learns different ways to obtain key vitamins and minerals, including through a recommended healthy diet. It is important to discuss all supplements you take with your doctor.   Healthy Mind-Set    Smoking Cessation  Clinical staff conducted group or individual video education with verbal and written material and guidebook.  Patient learns that cigarette smoking and tobacco addiction pose a serious health risk which affects millions of people. Stopping smoking will significantly reduce the risk of heart disease, lung disease, and many forms of cancer. Recommended strategies for quitting are covered, including working with your doctor to develop a successful plan.  Culinary   Becoming a Set designer conducted group or  individual video education with verbal and written material and guidebook.  Patient learns that cooking at home can be healthy, cost-effective, quick, and puts them in control. Keys to cooking healthy recipes will include looking at your recipe, assessing your equipment needs, planning ahead, making it simple, choosing cost-effective seasonal ingredients, and limiting the use of added fats, salts, and sugars.  Cooking - Breakfast and Snacks  Clinical staff conducted group or individual video education with verbal and written material and guidebook.  Patient learns how important breakfast is to satiety and nutrition through the entire day. Recommendations include key foods to eat during breakfast to help stabilize blood sugar levels and to prevent overeating at meals later in the day. Planning ahead is also a key component.  Cooking - Educational psychologist conducted group or individual video education with verbal and written material and guidebook.  Patient learns eating strategies to improve overall health, including an approach to cook more at home. Recommendations include thinking of animal protein as a side on your plate rather than center stage and focusing instead on lower calorie dense options like vegetables, fruits, whole grains, and plant-based proteins, such as beans. Making sauces in large quantities to freeze for later and leaving the skin on your vegetables are also recommended to maximize your experience.  Cooking - Healthy Salads and Dressing Clinical staff conducted group or individual video education with verbal and written material and guidebook.  Patient learns that vegetables, fruits, whole grains, and legumes are the foundations of the Pritikin Eating Plan. Recommendations include how to incorporate each of these in flavorful and healthy salads, and how to create homemade salad dressings. Proper handling of ingredients is also covered. Cooking - Soups and [employer] - Soups and Desserts Clinical staff conducted group or individual video education with verbal and written material and guidebook.  Patient learns that Pritikin soups and desserts make for easy, nutritious, and delicious snacks and meal components that are low in sodium, fat, sugar, and calorie density,  while high in vitamins, minerals, and filling fiber. Recommendations include simple and healthy ideas for soups and desserts.   Overview     The Pritikin Solution Program Overview Clinical staff conducted group or individual video education with verbal and written material and guidebook.  Patient learns that the results of the Pritikin Program have been documented in more than 100 articles published in peer-reviewed journals, and the benefits include reducing risk factors for (and, in some cases, even reversing) high cholesterol, high blood pressure, type 2 diabetes, obesity, and more! An overview of the three key pillars of the Pritikin Program will be covered: eating well, doing regular exercise, and having a healthy mind-set.  WORKSHOPS  Exercise: Exercise Basics: Building Your Action Plan Clinical staff led group instruction and group discussion with PowerPoint presentation and patient guidebook. To enhance the learning environment the use of posters, models and videos may be added. At the conclusion of this workshop, patients will comprehend the difference between physical activity and exercise, as well as the benefits of incorporating both, into their routine. Patients will understand the FITT (Frequency, Intensity, Time, and Type) principle and how to use it to build an exercise action plan. In addition, safety concerns and other considerations for exercise and cardiac rehab will be addressed by the presenter. The purpose of this lesson is to promote a comprehensive and effective weekly exercise routine in order to improve patients' overall level of fitness.   Managing Heart  Disease: Your Path to a Healthier Heart Clinical staff led group instruction and group discussion with PowerPoint presentation and patient guidebook. To enhance the learning environment the use of posters, models and videos may be added.At the conclusion of this workshop, patients will understand the anatomy and physiology of the heart. Additionally, they will understand how Pritikin's three pillars impact the risk factors, the progression, and the management of heart disease.  The purpose of this lesson is to provide a high-level overview of the heart, heart disease, and how the Pritikin lifestyle positively impacts risk factors.  Exercise Biomechanics Clinical staff led group instruction and group discussion with PowerPoint presentation and patient guidebook. To enhance the learning environment the use of posters, models and videos may be added. Patients will learn how the structural parts of their bodies function and how these functions impact their daily activities, movement, and exercise. Patients will learn how to promote a neutral spine, learn how to manage pain, and identify ways to improve their physical movement in order to promote healthy living. The purpose of this lesson is to expose patients to common physical limitations that impact physical activity. Participants will learn practical ways to adapt and manage aches and pains, and to minimize their effect on regular exercise. Patients will learn how to maintain good posture while sitting, walking, and lifting.  Balance Training and Fall Prevention  Clinical staff led group instruction and group discussion with PowerPoint presentation and patient guidebook. To enhance the learning environment the use of posters, models and videos may be added. At the conclusion of this workshop, patients will understand the importance of their sensorimotor skills (vision, proprioception, and the vestibular system) in maintaining their ability to  balance as they age. Patients will apply a variety of balancing exercises that are appropriate for their current level of function. Patients will understand the common causes for poor balance, possible solutions to these problems, and ways to modify their physical environment in order to minimize their fall risk. The purpose of this lesson is to teach  patients about the importance of maintaining balance as they age and ways to minimize their risk of falling.  WORKSHOPS   Nutrition:  Fueling a Ship broker led group instruction and group discussion with PowerPoint presentation and patient guidebook. To enhance the learning environment the use of posters, models and videos may be added. Patients will review the foundational principles of the Pritikin Eating Plan and understand what constitutes a serving size in each of the food groups. Patients will also learn Pritikin-friendly foods that are better choices when away from home and review make-ahead meal and snack options. Calorie density will be reviewed and applied to three nutrition priorities: weight maintenance, weight loss, and weight gain. The purpose of this lesson is to reinforce (in a group setting) the key concepts around what patients are recommended to eat and how to apply these guidelines when away from home by planning and selecting Pritikin-friendly options. Patients will understand how calorie density may be adjusted for different weight management goals.  Mindful Eating  Clinical staff led group instruction and group discussion with PowerPoint presentation and patient guidebook. To enhance the learning environment the use of posters, models and videos may be added. Patients will briefly review the concepts of the Pritikin Eating Plan and the importance of low-calorie dense foods. The concept of mindful eating will be introduced as well as the importance of paying attention to internal hunger signals. Triggers for non-hunger  eating and techniques for dealing with triggers will be explored. The purpose of this lesson is to provide patients with the opportunity to review the basic principles of the Pritikin Eating Plan, discuss the value of eating mindfully and how to measure internal cues of hunger and fullness using the Hunger Scale. Patients will also discuss reasons for non-hunger eating and learn strategies to use for controlling emotional eating.  Targeting Your Nutrition Priorities Clinical staff led group instruction and group discussion with PowerPoint presentation and patient guidebook. To enhance the learning environment the use of posters, models and videos may be added. Patients will learn how to determine their genetic susceptibility to disease by reviewing their family history. Patients will gain insight into the importance of diet as part of an overall healthy lifestyle in mitigating the impact of genetics and other environmental insults. The purpose of this lesson is to provide patients with the opportunity to assess their personal nutrition priorities by looking at their family history, their own health history and current risk factors. Patients will also be able to discuss ways of prioritizing and modifying the Pritikin Eating Plan for their highest risk areas  Menu  Clinical staff led group instruction and group discussion with PowerPoint presentation and patient guidebook. To enhance the learning environment the use of posters, models and videos may be added. Using menus brought in from [employer], or printed from [employer], patients will apply the Pritikin dining out guidelines that were presented in the [employer] video. Patients will also be able to practice these guidelines in a variety of provided scenarios. The purpose of this lesson is to provide patients with the opportunity to practice hands-on learning of the Pritikin Dining Out guidelines with actual menus and practice  scenarios.  Label Reading Clinical staff led group instruction and group discussion with PowerPoint presentation and patient guidebook. To enhance the learning environment the use of posters, models and videos may be added. Patients will review and discuss the Pritikin label reading guidelines presented in Pritikin's Label Reading Educational series  video. Using fool labels brought in from local grocery stores and markets, patients will apply the label reading guidelines and determine if the packaged food meet the Pritikin guidelines. The purpose of this lesson is to provide patients with the opportunity to review, discuss, and practice hands-on learning of the Pritikin Label Reading guidelines with actual packaged food labels. Cooking School  Pritikin's [employer] are designed to teach patients ways to prepare quick, simple, and affordable recipes at home. The importance of nutrition's role in chronic disease risk reduction is reflected in its emphasis in the overall Pritikin program. By learning how to prepare essential core Pritikin Eating Plan recipes, patients will increase control over what they eat; be able to customize the flavor of foods without the use of added salt, sugar, or fat; and improve the quality of the food they consume. By learning a set of core recipes which are easily assembled, quickly prepared, and affordable, patients are more likely to prepare more healthy foods at home. These workshops focus on convenient breakfasts, simple entres, side dishes, and desserts which can be prepared with minimal effort and are consistent with nutrition recommendations for cardiovascular risk reduction. Cooking [employer] are taught by a Armed forces logistics/support/administrative officer (RD) who has been trained by the [employer]. The chef or RD has a clear understanding of the importance of minimizing - if not completely eliminating - added fat, sugar, and sodium in recipes. Throughout  the series of Cooking School Workshop sessions, patients will learn about healthy ingredients and efficient methods of cooking to build confidence in their capability to prepare    Cooking School weekly topics:  Adding Flavor- Sodium-Free  Fast and Healthy Breakfasts  Powerhouse Plant-Based Proteins  Satisfying Salads and Dressings  Simple Sides and Sauces  International Cuisine-Spotlight on the [employer] Zones  Delicious Desserts  Savory Soups  [employer] - Meals in a Astronomer Appetizers and Snacks  Comforting Weekend Breakfasts  One-Pot Wonders   Fast Evening Meals  Landscape architect Your Pritikin Plate  WORKSHOPS   Healthy Mindset (Psychosocial):  Focused Goals, Sustainable Changes Clinical staff led group instruction and group discussion with PowerPoint presentation and patient guidebook. To enhance the learning environment the use of posters, models and videos may be added. Patients will be able to apply effective goal setting strategies to establish at least one personal goal, and then take consistent, meaningful action toward that goal. They will learn to identify common barriers to achieving personal goals and develop strategies to overcome them. Patients will also gain an understanding of how our mind-set can impact our ability to achieve goals and the importance of cultivating a positive and growth-oriented mind-set. The purpose of this lesson is to provide patients with a deeper understanding of how to set and achieve personal goals, as well as the tools and strategies needed to overcome common obstacles which may arise along the way.  From Head to Heart: The Power of a Healthy Outlook  Clinical staff led group instruction and group discussion with PowerPoint presentation and patient guidebook. To enhance the learning environment the use of posters, models and videos may be added. Patients will be able to recognize and describe the impact of emotions and  mood on physical health. They will discover the importance of self-care and explore self-care practices which may work for them. Patients will also learn how to utilize the 4 C's to cultivate a healthier outlook and better manage stress and challenges. The  purpose of this lesson is to demonstrate to patients how a healthy outlook is an essential part of maintaining good health, especially as they continue their cardiac rehab journey.  Healthy Sleep for a Healthy Heart Clinical staff led group instruction and group discussion with PowerPoint presentation and patient guidebook. To enhance the learning environment the use of posters, models and videos may be added. At the conclusion of this workshop, patients will be able to demonstrate knowledge of the importance of sleep to overall health, well-being, and quality of life. They will understand the symptoms of, and treatments for, common sleep disorders. Patients will also be able to identify daytime and nighttime behaviors which impact sleep, and they will be able to apply these tools to help manage sleep-related challenges. The purpose of this lesson is to provide patients with a general overview of sleep and outline the importance of quality sleep. Patients will learn about a few of the most common sleep disorders. Patients will also be introduced to the concept of "sleep hygiene," and discover ways to self-manage certain sleeping problems through simple daily behavior changes. Finally, the workshop will motivate patients by clarifying the links between quality sleep and their goals of heart-healthy living.   Recognizing and Reducing Stress Clinical staff led group instruction and group discussion with PowerPoint presentation and patient guidebook. To enhance the learning environment the use of posters, models and videos may be added. At the conclusion of this workshop, patients will be able to understand the types of stress reactions, differentiate between  acute and chronic stress, and recognize the impact that chronic stress has on their health. They will also be able to apply different coping mechanisms, such as reframing negative self-talk. Patients will have the opportunity to practice a variety of stress management techniques, such as deep abdominal breathing, progressive muscle relaxation, and/or guided imagery.  The purpose of this lesson is to educate patients on the role of stress in their lives and to provide healthy techniques for coping with it.  Learning Barriers/Preferences:  Learning Barriers/Preferences - 08/05/23 1344       Learning Barriers/Preferences   Learning Barriers None    Learning Preferences Audio;Computer/Internet;Group Instruction;Individual Instruction;Pictoral;Skilled Demonstration;Verbal Instruction;Video;Written Material             Education Topics:  Knowledge Questionnaire Score:  Knowledge Questionnaire Score - 08/05/23 1517       Knowledge Questionnaire Score   Pre Score 21/24             Core Components/Risk Factors/Patient Goals at Admission:  Personal Goals and Risk Factors at Admission - 08/05/23 1346       Core Components/Risk Factors/Patient Goals on Admission   Hypertension Yes    Intervention Provide education on lifestyle modifcations including regular physical activity/exercise, weight management, moderate sodium restriction and increased consumption of fresh fruit, vegetables, and low fat dairy, alcohol moderation, and smoking cessation.;Monitor prescription use compliance.    Expected Outcomes Short Term: Continued assessment and intervention until BP is < 140/14mm HG in hypertensive participants. < 130/15mm HG in hypertensive participants with diabetes, heart failure or chronic kidney disease.;Long Term: Maintenance of blood pressure at goal levels.    Lipids Yes    Intervention Provide education and support for participant on nutrition & aerobic/resistive exercise along with  prescribed medications to achieve LDL 70mg , HDL >40mg .    Expected Outcomes Short Term: Participant states understanding of desired cholesterol values and is compliant with medications prescribed. Participant is following exercise prescription and nutrition guidelines.;Long Term:  Cholesterol controlled with medications as prescribed, with individualized exercise RX and with personalized nutrition plan. Value goals: LDL < 70mg , HDL > 40 mg.    Personal Goal Other Yes    Personal Goal Improve upper & lower body strength, be able to pick things up from the floor, be able to tie shoes and/ or put socks on comfortably. Decrease intake of red meat and sweets. Visit more with friends. Slow down on work activities and take time to enjoy life.    Intervention Mr. Lindig will participate in Exercise, Nutrition, and Healthy Mind-Set workshops. He will develop an Exercise Action Plan to help increase strength, stamina, and flexibility.    Expected Outcomes Mr Havlicek will comply with Exercise and Nutrition Action plans to help achieve personal goals as measured by self-report and functional tests.             Core Components/Risk Factors/Patient Goals Review:    Core Components/Risk Factors/Patient Goals at Discharge (Final Review):    ITP Comments:  ITP Comments     Row Name 08/05/23 1254           ITP Comments Medical Director- Dr. Gaylyn Keas, MD. Introduction to the Pritikin Education / Intensive Cardiac Rehab Program. Reviewed intital orientation folder with patient.                Comments: Rizwan Mammone attended orientation for the cardiac rehabilitation program on  08/05/2023  to perform initial intake and exercise walk test. He was introduced to the [employer] education and orientation packet was reviewed. Completed 6-minute walk test, measurements, initial ITP, and exercise prescription. Vital signs stable. Telemetry-paced, normal sinus rhythm, mild shortness of breath during walk  test.   Service time was from 1254 to 1436. Doree Games, MS, ACSM CEP 08/05/2023 [phone]

## 2023-08-06 ENCOUNTER — Encounter (HOSPITAL_COMMUNITY): Attending: Internal Medicine

## 2023-08-06 DIAGNOSIS — Z9889 Other specified postprocedural states: Secondary | ICD-10-CM | POA: Insufficient documentation

## 2023-08-08 ENCOUNTER — Ambulatory Visit (HOSPITAL_COMMUNITY)
Admission: RE | Admit: 2023-08-08 | Discharge: 2023-08-08 | Disposition: A | Discharge status: Home / Self Care | Source: Ambulatory Visit | Attending: Internal Medicine | Admitting: Internal Medicine

## 2023-08-08 DIAGNOSIS — I480 Paroxysmal atrial fibrillation: Secondary | ICD-10-CM | POA: Insufficient documentation

## 2023-08-08 LAB — BASIC METABOLIC PANEL WITH GFR
Anion gap: 8 (ref 5–15)
BUN: 15 mg/dL (ref 8–23)
CO2: 25 mmol/L (ref 22–32)
Calcium: 9.1 mg/dL (ref 8.9–10.3)
Chloride: 109 mmol/L (ref 98–111)
Creatinine, Ser: 1.02 mg/dL (ref 0.61–1.24)
GFR, Estimated: >60 mL/min (ref >60)
Glucose, Bld: 93 mg/dL (ref 70–99)
Potassium: 4.4 mmol/L (ref 3.5–5.1)
Sodium: 142 mmol/L (ref 135–145)

## 2023-08-11 ENCOUNTER — Encounter (HOSPITAL_COMMUNITY)
Admission: RE | Admit: 2023-08-11 | Discharge: 2023-08-11 | Disposition: A | Discharge status: Home / Self Care | Source: Ambulatory Visit | Attending: Internal Medicine | Admitting: Internal Medicine

## 2023-08-11 DIAGNOSIS — Z9889 Other specified postprocedural states: Secondary | ICD-10-CM | POA: Diagnosis not present

## 2023-08-11 NOTE — Progress Notes (Signed)
 Daily Session Note  Patient Details  Name: [name] [name] MRN: [id number] Date of Birth: Jan 24, 1946 Referring Provider:   Flowsheet Row INTENSIVE CARDIAC REHAB ORIENT from 08/05/2023 in [facility]  Referring Provider Kyra Phy, MD       Encounter Date: 08/11/2023  Check In:  Session Check In - 08/11/23 1604       Check-In   Supervising physician immediately available to respond to emergencies [facility] MD immediately available    Physician(s) Slater Duncan, NP    Location [facility]    Staff Present Hilbert Loving, MS, ACSM-CEP, Exercise Physiologist;Amina Menchaca, RN, Malvin Searing, MS, ACSM-CEP, CCRP, Exercise Physiologist;Mary Arlester Ladd, RN, BSN;Casey Felipe Horton, RT;Johnny Alexia Angelucci, MS, Exercise Physiologist    Virtual Visit No    Medication changes reported     No    Fall or balance concerns reported    No    Tobacco Cessation No Change    Warm-up and Cool-down Performed as group-led instruction   Cardiac Rehab Orientation   Resistance Training Performed Yes    VAD Patient? No    PAD/SET Patient? No      Pain Assessment   Currently in Pain? No/denies    Pain Score 0-No pain    Multiple Pain Sites No             Capillary Blood Glucose: No results found for this or any previous visit (from the past 24 hours).    Social History   Tobacco Use  Smoking Status Never  Smokeless Tobacco Never    Goals Met:  Exercise tolerated well No report of concerns or symptoms today Strength training completed today  Goals Unmet:  Not Applicable  Comments: Pt started cardiac rehab today.  Pt tolerated light exercise without difficulty. VSS, telemetry-A paced, asymptomatic.  Medication list reconciled. Pt denies barriers to medicaiton compliance.  PSYCHOSOCIAL ASSESSMENT:  PHQ-2. Pt exhibits positive coping skills, hopeful outlook with supportive family. No psychosocial needs identified at this  time, no psychosocial interventions necessary.    Pt enjoys golfing.   Pt oriented to exercise equipment and routine.    Understanding verbalized. [place] RN BSN    Dr. Gaylyn Keas is Medical Director for Cardiac Rehab at [facility].

## 2023-08-12 ENCOUNTER — Other Ambulatory Visit (HOSPITAL_COMMUNITY): Payer: Self-pay | Admitting: *Deleted

## 2023-08-12 ENCOUNTER — Other Ambulatory Visit (INDEPENDENT_AMBULATORY_CARE_PROVIDER_SITE_OTHER)

## 2023-08-12 ENCOUNTER — Telehealth: Payer: Self-pay

## 2023-08-12 ENCOUNTER — Encounter: Payer: Self-pay | Admitting: Physician Assistant

## 2023-08-12 ENCOUNTER — Ambulatory Visit: Admitting: Physician Assistant

## 2023-08-12 VITALS — BP 110/68 | HR 70 | Ht 70.0 in | Wt 190.8 lb

## 2023-08-12 DIAGNOSIS — D62 Acute posthemorrhagic anemia: Secondary | ICD-10-CM

## 2023-08-12 DIAGNOSIS — D649 Anemia, unspecified: Secondary | ICD-10-CM

## 2023-08-12 DIAGNOSIS — K559 Vascular disorder of intestine, unspecified: Secondary | ICD-10-CM

## 2023-08-12 DIAGNOSIS — Z860101 Personal history of adenomatous and serrated colon polyps: Secondary | ICD-10-CM

## 2023-08-12 DIAGNOSIS — E559 Vitamin D deficiency, unspecified: Secondary | ICD-10-CM

## 2023-08-12 DIAGNOSIS — R1310 Dysphagia, unspecified: Secondary | ICD-10-CM | POA: Diagnosis not present

## 2023-08-12 DIAGNOSIS — Z9889 Other specified postprocedural states: Secondary | ICD-10-CM

## 2023-08-12 LAB — CBC WITH DIFFERENTIAL/PLATELET
Basophils Absolute: 0.1 10*3/uL (ref 0.0–0.1)
Basophils Relative: 1 % (ref 0.0–3.0)
Eosinophils Absolute: 0.1 10*3/uL (ref 0.0–0.7)
Eosinophils Relative: 1.2 % (ref 0.0–5.0)
HCT: 40 % (ref 39.0–52.0)
Hemoglobin: 13 g/dL (ref 13.0–17.0)
Lymphocytes Relative: 15.6 % (ref 12.0–46.0)
Lymphs Abs: 0.9 10*3/uL (ref 0.7–4.0)
MCHC: 32.5 g/dL (ref 30.0–36.0)
MCV: 91.4 fl (ref 78.0–100.0)
Monocytes Absolute: 0.7 10*3/uL (ref 0.1–1.0)
Monocytes Relative: 11.7 % (ref 3.0–12.0)
Neutro Abs: 4 10*3/uL (ref 1.4–7.7)
Neutrophils Relative %: 70.5 % (ref 43.0–77.0)
Platelets: 251 10*3/uL (ref 150.0–400.0)
RBC: 4.38 Mil/uL (ref 4.22–5.81)
RDW: 16.8 % — ABNORMAL HIGH (ref 11.5–15.5)
WBC: 5.7 10*3/uL (ref 4.0–10.5)

## 2023-08-12 LAB — VITAMIN D 25 HYDROXY (VIT D DEFICIENCY, FRACTURES): VITD: 31.67 ng/mL (ref 30.00–100.00)

## 2023-08-12 LAB — B12 AND FOLATE PANEL
Folate: 22.5 ng/mL (ref >5.9)
Vitamin B-12: 253 pg/mL (ref 211–911)

## 2023-08-12 NOTE — Progress Notes (Addendum)
 se     Brigitte Canard, PA-C [address] [place], [place]  [address] Phone: [phone]   Primary Care Physician: Austine Lefort, MD  Primary Gastroenterologist:  Brigitte Canard, PA-C / Dr. Abundio Hoit  Chief Complaint: Hospital follow-up lower GI bleed, ischemic colitis       HPI:   [name] [name] is a 78 y.o. male returns for Hospital followup.  He underwent mitral valve repair surgery 05/27/2023 by Dr. Honey Lusty.  He had postop complications including tachybradycardia syndrome requiring DCCV and permanent pacemaker placement on 2/25.  He was Hospitalized 05/27/2023 until 06/18/2023 for acute lower GI bleed thought secondary to ischemic colitis.  Also had a large tubular adenoma which was not able to be removed.  Dr. Abundio Hoit saw patient in hospital for GI consult.  He was given zosyn  for leukocytosis due to possible diverticulitis, he also developed an AKI that resolved with time.   Electrophysiology followed the patient closely for recurrent atrial fibrillation.   06/14/2023 colonoscopy by Dr. General Kenner: External and internal hemorrhoids, mild diverticulosis in the left and right colon, no recent bleeding.  Multiple sessile polyps in the descending colon, transverse colon, and cecum.  Polyps were small less than 1 cm.  Not removed given his recent bleeding symptoms.  Evidence of ischemic colitis in the transverse colon and hepatic flexure.  Large bulky sessile villous polypoid lesion in the distal transverse colon.  Torturous colon.  Biopsies showed tubular adenoma with no high-grade dysplasia.  Colon biopsies showed granulation tissue with no dysplasia or malignancy.  Biopsies consistent with ischemic colitis.  He needs repeat colonoscopy for polypectomy.  Currently he is feeling better since he has been discharged from hospital.  Energy is improving.  A-fib resolved.  He recently started cardiac rehab exercise program.  Currently takes pantoprazole  40 Mg daily.  Denies heartburn.  He has had some  intermittent mild solid food dysphagia.  Feels like beef or stew gets stuck in his chest.  Uncomfortable.  He denies abdominal pain, constipation, melena, or hematochezia.  PMH: Currently on Eliquis  and aspirin , atrial fibrillation (s/p ablation 2012), HLD, HTN, sinus node dysfunction, CKD stage II, TIA 2011, mitral regurgitation, and tricuspid regurgitation.   He had Cardiology f/u OV 07/08/23.  Stable and Improving.  Component Ref Range & Units (hover) 4 wk ago (07/14/23) 1 mo ago (06/24/23) 1 mo ago (06/19/23) 1 mo ago (06/18/23) 1 mo ago (06/17/23) 1 mo ago (06/16/23) 1 mo ago (06/15/23)  WBC 6.2 4.7 R 8.2 R 8.7 R 9.6 R 8.9 R 10.7 High  R  RBC 4.50 3.60 Low  R 3.71 Low  R 3.75 Low  R 3.61 Low  R 3.21 Low  R 3.58 Low  R  Hemoglobin 12.5 Low  10.4 Low  R 10.8 Low  R 10.9 Low  R 10.5 Low  R 9.3 Low  R 10.3 Low  R  Hematocrit 39.9 32.4 Low  R 33.6 Low  R 33.7 Low  R 32.2 Low  R 28.5 Low  R 31.4 Low  R  MCV 89 90.0 R 90.6 R 89.9 R 89.2 R 88.8 R 87.7 R  MCH 27.8 28.9 R 29.1 R 29.1 R 29.1 R 29.0 R 28.8 R  MCHC 31.3 Low  32.1 R 32.1 R 32.3 R 32.6 R 32.6 R 32.8 R  RDW 14.1 14.6 R 14.8 R 15.1 R 15.2 R 15.6 High  R 16.0 High  R  Platelets 305 281 R 257 R 235 R 200 R  166 R 198 R     Current Outpatient Medications  Medication Sig Dispense Refill   amiodarone  (PACERONE ) 200 MG tablet Take 1 tablet (200 mg total) by mouth daily. 90 tablet 0   apixaban  (ELIQUIS ) 5 MG TABS tablet Take 1 tablet (5 mg total) by mouth 2 (two) times daily. 180 tablet 0   atorvastatin  (LIPITOR) 20 MG tablet Take 1 tablet (20 mg total) by mouth daily. 30 tablet 0   acetaminophen  (TYLENOL ) 325 MG tablet Take 1-2 tablets (325-650 mg total) by mouth every 4 (four) hours as needed for mild pain (pain score 1-3). (Patient not taking: Reported on 08/12/2023)     aspirin  EC 81 MG tablet Take 1 tablet (81 mg total) by mouth daily. Swallow whole. (Patient not taking: Reported on 08/05/2023)     Fe Fum-Vit C-Vit B12-FA (TRIGELS-F FORTE) CAPS  capsule Take 1 capsule by mouth daily. (Patient not taking: Reported on 08/12/2023) 30 capsule 0   furosemide  (LASIX ) 20 MG tablet Take 1 tablet (20 mg total) by mouth daily as needed. (Patient not taking: Reported on 08/12/2023)     pantoprazole  (PROTONIX ) 40 MG tablet Take 1 tablet (40 mg total) by mouth 2 (two) times daily. (Patient not taking: Reported on 08/12/2023) 60 tablet 0   potassium chloride  SA (KLOR-CON  M20) 20 MEQ tablet Take 1 tablet (20 mEq total) by mouth daily as needed. (Patient not taking: Reported on 08/12/2023)     sulfamethoxazole -trimethoprim  (BACTRIM  DS) 800-160 MG tablet Take 1 tablet by mouth 2 (two) times daily. (Patient not taking: Reported on 08/12/2023) 14 tablet 0   Vitamin D , Ergocalciferol , (DRISDOL ) 1.25 MG (50000 UNIT) CAPS capsule Take 1 capsule (50,000 Units total) by mouth every 7 (seven) days. (Patient not taking: Reported on 08/12/2023) 5 capsule 0   No current facility-administered medications for this visit.    Allergies as of 08/12/2023   (No Known Allergies)    Past Medical History:  Diagnosis Date   Arthritis    Back   CVA (cerebral infarction) 07/14/2009   TIA   Dysrhythmia    A. Fib   Heart murmur    History of kidney stones    Hyperlipidemia    Hypertension    Nephrolithiasis    Paroxysmal atrial fibrillation (HCC)    Paroxysmal atrial flutter (HCC)    a. s/p ablation 2012.   Prostate cancer [facility])    Stroke [facility])    TIA - no deficits    Past Surgical History:  Procedure Laterality Date   APPENDECTOMY  1967   BONE BIOPSY  06/14/2023   Procedure: BIOPSY,Colon;  Surgeon: Ace Holder, MD;  Location: [facility] ENDOSCOPY;  Service: Gastroenterology;;   CARDIOVERSION N/A 06/03/2023   Procedure: CARDIOVERSION;  Surgeon: Sheryle Donning, MD;  Location: [facility] INVASIVE CV LAB;  Service: Cardiovascular;  Laterality: N/A;   CATARACT EXTRACTION W/ INTRAOCULAR LENS IMPLANT Bilateral    CERVICAL SPINE SURGERY     Cadaver Bone Placed   COLONOSCOPY      COLONOSCOPY N/A 06/14/2023   Procedure: COLONOSCOPY;  Surgeon: Ace Holder, MD;  Location: [facility] ENDOSCOPY;  Service: Gastroenterology;  Laterality: N/A;   CYSTOSCOPY/URETEROSCOPY/HOLMIUM LASER/STENT PLACEMENT Left 09/19/2020   Procedure: CYSTOSCOPY RETROGRADE PYELOGRAM URETEROSCOPY/HOLMIUM LASER/STENT PLACEMENT;  Surgeon: Homero Luster, MD;  Location: [facility] ORS;  Service: Urology;  Laterality: Left;   EYE SURGERY Bilateral    Eye Lid lifting   LUMBAR SPINE SURGERY     Ruptured Disc   MAZE N/A 05/27/2023   Procedure: MAZE;  Surgeon: Melene Sportsman, MD;  Location: [facility] OR;  Service: Open Heart Surgery;  Laterality: N/A;   MITRAL VALVE REPAIR N/A 05/27/2023   Procedure: MITRAL VALVE REPAIR USING MEDTRONIC SIMULUS SEMI-RIDGID ANNULOPLASTY BAND (MVR);  Surgeon: Melene Sportsman, MD;  Location: [facility] OR;  Service: Open Heart Surgery;  Laterality: N/A;   PACEMAKER IMPLANT N/A 06/03/2023   Procedure: PACEMAKER IMPLANT;  Surgeon: Tammie Fall, MD;  Location: [facility] INVASIVE CV LAB;  Service: Cardiovascular;  Laterality: N/A;   PROSTATECTOMY  2005   RIGHT/LEFT HEART CATH AND CORONARY ANGIOGRAPHY N/A 02/27/2023   Procedure: RIGHT/LEFT HEART CATH AND CORONARY ANGIOGRAPHY;  Surgeon: Kyra Phy, MD;  Location: [facility] INVASIVE CV LAB;  Service: Cardiovascular;  Laterality: N/A;   TEE WITHOUT CARDIOVERSION N/A 02/07/2023   Procedure: TRANSESOPHAGEAL ECHOCARDIOGRAM;  Surgeon: Luana Rumple, MD;  Location: [facility] INVASIVE CV LAB;  Service: Cardiovascular;  Laterality: N/A;   TEE WITHOUT CARDIOVERSION N/A 05/27/2023   Procedure: TRANSESOPHAGEAL ECHOCARDIOGRAM (TEE);  Surgeon: Melene Sportsman, MD;  Location: [facility] OR;  Service: Open Heart Surgery;  Laterality: N/A;   TRANSESOPHAGEAL ECHOCARDIOGRAM (CATH LAB) N/A 06/03/2023   Procedure: TRANSESOPHAGEAL ECHOCARDIOGRAM;  Surgeon: Sheryle Donning, MD;  Location: [facility] INVASIVE CV LAB;  Service: Cardiovascular;  Laterality: N/A;   TRICUSPID VALVE REPLACEMENT N/A 05/27/2023    Procedure: TRICUSPID VALVE REPAIR USING EDWARDS MC3 TRICUSPID ANNULOPLASTY RING;  Surgeon: Melene Sportsman, MD;  Location: [facility] OR;  Service: Open Heart Surgery;  Laterality: N/A;    Review of Systems:    All systems reviewed and negative except where noted in HPI.    Physical Exam:  BP 110/68   Pulse 70   Ht 5\' 10"  (1.778 m)   Wt 190 lb 12.8 oz (86.5 kg)   BMI 27.38 kg/m  No LMP for male patient.  General: Well-nourished, well-developed in no acute distress.  Lungs: Clear to auscultation bilaterally. Non-labored. Heart: Regular rate and rhythm, no murmurs rubs or gallops.  Abdomen: Bowel sounds are normal; Abdomen is Soft; No hepatosplenomegaly, masses or hernias;  No Abdominal Tenderness; No guarding or rebound tenderness. Neuro: Alert and oriented x 3.  Grossly intact.  Psych: Alert and cooperative, normal mood and affect.   Imaging Studies: DG Chest 2 View Result Date: 07/24/2023 CLINICAL DATA:  Mitral valve repair, follow-up EXAM: CHEST - 2 VIEW COMPARISON:  07/08/2023 FINDINGS: Postoperative changes from prior valve repair. Right pacer remains in place, unchanged. Mild cardiomegaly. Mediastinal contours within normal limits. No confluent opacities, effusions or pneumothorax. No acute bony abnormality. IMPRESSION: No active cardiopulmonary disease. Electronically Signed   By: Janeece Mechanic M.D.   On: 07/24/2023 17:28   ECHOCARDIOGRAM COMPLETE Result Date: 07/24/2023    ECHOCARDIOGRAM REPORT   Patient Name:   [name] [name] Date of Exam: 07/23/2023 Medical Rec #:  [id number]      Height:       70.0 in Accession #:    [id number]     Weight:       192.0 lb Date of Birth:  Mar 22, 1946       BSA:          2.051 m Patient Age:    78 years       BP:           132/78 mmHg Patient Gender: M              HR:           70 bpm. Exam Location:  Outpatient Procedure: 2D Echo, 3D  Echo, Cardiac Doppler, Color Doppler and Strain Analysis            (Both Spectral and Color Flow Doppler were utilized  during            procedure). Indications:     R06.9 DOE; R60.0 Lower extremity edema  History:         Patient has prior history of Echocardiogram examinations, most                  recent 06/03/2023. 05/27/2023 MV annuloplasty, TV Ring and LAA                  clip. 06/03/2023 Pacemaker inserted, TIA, Arrythmias:Atrial                  Fibrillation and Atrial Flutter, Signs/Symptoms:Edema and                  Dyspnea; Risk Factors:Hypertension and Non-Smoker. Patient                  denies chest pain. He does get DOE and bilateral leg edema.                   Mitral Valve: prosthetic annuloplasty ring valve is present in                  the mitral position.  Sonographer:     Richarda Chance RVT, RDCS (AE), RDMS Referring Phys:  [phone] Karon Packer DICK Diagnosing Phys: Gaylyn Keas MD IMPRESSIONS  1. Left ventricular ejection fraction, by estimation, is 55 to 60%. Left ventricular ejection fraction by 3D volume is 57 %. The left ventricle has normal function. The left ventricle has no regional wall motion abnormalities. There is mild concentric left ventricular hypertrophy. Left ventricular diastolic parameters are consistent with Grade III diastolic dysfunction (restrictive).  2. Right ventricular systolic function is normal. The right ventricular size is mildly enlarged. Tricuspid regurgitation signal is inadequate for assessing PA pressure.  3. Left atrial size was moderately dilated.  4. Right atrial size was moderately dilated.  5. The mitral valve is degenerative. Mild mitral valve regurgitation. No evidence of mitral stenosis. The mean mitral valve gradient is 2.0 mmHg. Moderate mitral annular calcification. There is a prosthetic annuloplasty ring present in the mitral position.  6. The tricuspid valve is has been repaired/replaced. The tricuspid valve is status post repair with an annuloplasty ring.  7. The aortic valve is tricuspid. Aortic valve regurgitation is trivial. Aortic valve sclerosis/calcification  is present, without any evidence of aortic stenosis. Aortic valve area, by VTI measures 2.30 cm. Aortic valve mean gradient measures 5.0 mmHg.  Aortic valve Vmax measures 1.52 m/s.  8. Pulmonic valve regurgitation is moderate.  9. Aortic dilatation noted. Aneurysm of the ascending aorta, measuring 45 mm. There is mild dilatation of the aortic root, measuring 41 mm. 10. The inferior vena cava is dilated in size with >50% respiratory variability, suggesting right atrial pressure of 8 mmHg. Comparison(s): EF 35%, RVSP 38.9 mmHg, asc aorta 43 mm, moderate PI. FINDINGS  Left Ventricle: Left ventricular ejection fraction, by estimation, is 55 to 60%. Left ventricular ejection fraction by 3D volume is 57 %. The left ventricle has normal function. The left ventricle has no regional wall motion abnormalities. Strain was performed and the global longitudinal strain is indeterminate. The left ventricular internal cavity size was normal in size. There is mild concentric left ventricular hypertrophy. Left ventricular diastolic parameters  are consistent with Grade III diastolic dysfunction (restrictive). Right Ventricle: The right ventricular size is mildly enlarged. No increase in right ventricular wall thickness. Right ventricular systolic function is normal. Tricuspid regurgitation signal is inadequate for assessing PA pressure. Left Atrium: Left atrial size was moderately dilated. Right Atrium: Right atrial size was moderately dilated. Pericardium: There is no evidence of pericardial effusion. Mitral Valve: The mitral valve is degenerative in appearance. There is mild calcification of the mitral valve leaflet(s). Moderate mitral annular calcification. Mild mitral valve regurgitation. There is a prosthetic annuloplasty ring present in the mitral position. No evidence of mitral valve stenosis. MV peak gradient, 5.8 mmHg. The mean mitral valve gradient is 2.0 mmHg. Tricuspid Valve: The tricuspid valve is has been  repaired/replaced. Tricuspid valve regurgitation is trivial. No evidence of tricuspid stenosis. The tricuspid valve is status post repair with an annuloplasty ring. Aortic Valve: The aortic valve is tricuspid. Aortic valve regurgitation is trivial. Aortic valve sclerosis/calcification is present, without any evidence of aortic stenosis. Aortic valve mean gradient measures 5.0 mmHg. Aortic valve peak gradient measures 9.2 mmHg. Aortic valve area, by VTI measures 2.30 cm. Pulmonic Valve: The pulmonic valve was normal in structure. Pulmonic valve regurgitation is moderate. No evidence of pulmonic stenosis. Aorta: Aortic dilatation noted. There is mild dilatation of the aortic root, measuring 41 mm. There is an aneurysm involving the ascending aorta measuring 45 mm. Venous: The inferior vena cava is dilated in size with greater than 50% respiratory variability, suggesting right atrial pressure of 8 mmHg. IAS/Shunts: No atrial level shunt detected by color flow Doppler. Additional Comments: 3D was performed not requiring image post processing on an independent workstation and was normal. A device lead is visualized.  LEFT VENTRICLE PLAX 2D LVIDd:         5.28 cm         Diastology LVIDs:         3.34 cm         LV e' medial:    5.44 cm/s LV PW:         1.12 cm         LV E/e' medial:  22.4 LV IVS:        1.15 cm         LV e' lateral:   8.49 cm/s LVOT diam:     2.00 cm         LV E/e' lateral: 14.4 LV SV:         61 LV SV Index:   30 LVOT Area:     3.14 cm        3D Volume EF                                LV 3D EF:    Left                                             ventricul                                             ar  ejection                                             fraction                                             by 3D                                             volume is                                             57 %.                                 3D Volume  EF:                                3D EF:        57 %                                LV EDV:       141 ml                                LV ESV:       61 ml                                LV SV:        80 ml RIGHT VENTRICLE RV Basal diam:  4.14 cm RV Mid diam:    3.33 cm RV S prime:     11.50 cm/s TAPSE (M-mode): 1.2 cm LEFT ATRIUM              Index        RIGHT ATRIUM           Index LA diam:        4.60 cm  2.24 cm/m   RA Area:     25.20 cm LA Vol (A2C):   121.0 ml 58.98 ml/m  RA Volume:   74.70 ml  36.41 ml/m LA Vol (A4C):   63.1 ml  30.76 ml/m LA Biplane Vol: 92.3 ml  44.99 ml/m  AORTIC VALVE                     PULMONIC VALVE AV Area (Vmax):    2.29 cm      PV Vmax:          1.06 m/s AV Area (Vmean):   2.28 cm      PV Peak grad:     4.5 mmHg AV Area (VTI):     2.30 cm      PR End  Diast Vel: 4.16 msec AV Vmax:           152.00 cm/s AV Vmean:          101.700 cm/s AV VTI:            0.266 m AV Peak Grad:      9.2 mmHg AV Mean Grad:      5.0 mmHg LVOT Vmax:         111.00 cm/s LVOT Vmean:        73.900 cm/s LVOT VTI:          0.194 m LVOT/AV VTI ratio: 0.73  AORTA Ao Root diam: 4.10 cm Ao Asc diam:  4.50 cm MITRAL VALVE MV Area (PHT): 5.02 cm     SHUNTS MV Area VTI:   2.00 cm     Systemic VTI:  0.19 m MV Peak grad:  5.8 mmHg     Systemic Diam: 2.00 cm MV Mean grad:  2.0 mmHg MV Vmax:       1.20 m/s MV Vmean:      58.8 cm/s MV Decel Time: 151 msec MV E velocity: 122.00 cm/s MV A velocity: 56.20 cm/s MV E/A ratio:  2.17 Gaylyn Keas MD Electronically signed by Gaylyn Keas MD Signature Date/Time: 07/24/2023/8:36:58 AM    Final (Updated)    CUP PACEART REMOTE DEVICE CHECK Result Date: 07/21/2023 Scheduled remote reviewed. Normal device function.  Presenting rhythm: AP. Hx of AF, longest 33 days 13 hrs 37 min on 06/05/23, burden 76%, on Eliquis  per Epic. Next remote 91 days. [facility], [facility]   Labs: CBC    Component Value Date/Time   WBC 6.2 07/14/2023 1528   WBC 4.7 06/24/2023 0511   RBC 4.50 07/14/2023  1528   RBC 3.60 (L) 06/24/2023 0511   HGB 12.5 (L) 07/14/2023 1528   HCT 39.9 07/14/2023 1528   PLT 305 07/14/2023 1528   MCV 89 07/14/2023 1528   MCH 27.8 07/14/2023 1528   MCH 28.9 06/24/2023 0511   MCHC 31.3 (L) 07/14/2023 1528   MCHC 32.1 06/24/2023 0511   RDW 14.1 07/14/2023 1528   LYMPHSABS 1.0 06/24/2023 0511   MONOABS 0.6 06/24/2023 0511   EOSABS 0.2 06/24/2023 0511   BASOSABS 0.0 06/24/2023 0511    CMP     Component Value Date/Time   NA 142 08/08/2023 1027   NA 140 07/14/2023 1528   K 4.4 08/08/2023 1027   CL 109 08/08/2023 1027   CO2 25 08/08/2023 1027   GLUCOSE 93 08/08/2023 1027   BUN 15 08/08/2023 1027   BUN 11 07/14/2023 1528   CREATININE 1.02 08/08/2023 1027   CREATININE 1.06 04/07/2023 0840   CALCIUM  9.1 08/08/2023 1027   PROT 5.1 (L) 06/19/2023 0545   ALBUMIN  2.2 (L) 06/19/2023 0545   AST 19 06/19/2023 0545   ALT 18 06/19/2023 0545   ALKPHOS 62 06/19/2023 0545   BILITOT 0.7 06/19/2023 0545   GFRNONAA >60 08/08/2023 1027   GFRNONAA 73 06/28/2020 0817   GFRAA 85 06/28/2020 0817       Assessment and Plan:   [name] [name] is a 78 y.o. y/o male returns for hospital follow-up of:  1.  Ischemic colitis with acute blood loss anemia;  Anemia has improved.  Bleeding has resolved. - Labs: CBC, iron panel, ferritin, B12, folate - Patient education discussed.  2.  Large tubulovillous adenoma in the transverse colon; multiple smaller colon polyps: Colonoscopy 06/14/2023 by Dr. General Kenner in hospital.  Polyps were not able to  be removed.  Needs repeat colonoscopy to remove polyps. -I discussed risks of colonoscopy with patient to include risk of bleeding, colon perforation, and risk of sedation.  Patient expressed understanding and agrees to proceed with colonoscopy.  - We sent cardiac clearance and permission to hold Eliquis  2 days prior to colonoscopy to his cardiologist Dr. Lorie Rook. - After he has received cardiac clearance, then when we will schedule  colonoscopy in hospital.  3.  Comorbidities: s/p mitral valve repair 05/27/2023, A-fib, (s/p ablation 2012), HLD, HTN, sinus node dysfunction, CKD stage II, TIA 2011, mitral regurgitation, and tricuspid regurgitation.  Currently on Eliquis  and aspirin . - Needs cardiac clearance and permission to hold Eliquis  prior to colonoscopy procedure.  4.  Mild vague dysphagia - Ordering barium swallow with tablet. - If barium swallow test is abnormal, then consider scheduling EGD.  Brigitte Canard, PA-C  Follow up Based on Lab and Procedure results.

## 2023-08-12 NOTE — Patient Instructions (Addendum)
 Your provider has requested that you go to the basement level for lab work before leaving today. Press "B" on the elevator. The lab is located at the first door on the left as you exit the elevator.  You have been scheduled for a Barium Esophogram at [facility] (Main entrance of the hospital) on 08/26/23 at 9:30am. Please arrive 30 minutes prior to your appointment for registration. Make certain not to have anything to eat or drink 3 hours prior to your test. If you need to reschedule for any reason, please contact radiology at [phone] to do so. There will be free valet parking, so feel free to take advantage of that option. __________________________________________________________________ A barium swallow is an examination that concentrates on views of the esophagus. This tends to be a double contrast exam (barium and two liquids which, when combined, create a gas to distend the wall of the oesophagus) or single contrast (non-ionic iodine based). The study is usually tailored to your symptoms so a good history is essential. Attention is paid during the study to the form, structure and configuration of the esophagus, looking for functional disorders (such as aspiration, dysphagia, achalasia, motility and reflux) EXAMINATION You may be asked to change into a gown, depending on the type of swallow being performed. A radiologist and radiographer will perform the procedure. The radiologist will advise you of the type of contrast selected for your procedure and direct you during the exam. You will be asked to stand, sit or lie in several different positions and to hold a small amount of fluid in your mouth before being asked to swallow while the imaging is performed .In some instances you may be asked to swallow barium coated marshmallows to assess the motility of a solid food bolus. The exam can be recorded as a digital or video fluoroscopy procedure. POST PROCEDURE It will take 1-2 days for  the barium to pass through your system. To facilitate this, it is important, unless otherwise directed, to increase your fluids for the next 24-48hrs and to resume your normal diet.  This test typically takes about 30 minutes to perform. __________________________________________________________________________________  Please follow up sooner if symptoms increase or worsen  Due to recent changes in healthcare laws, you may see the results of your imaging and laboratory studies on MyChart before your provider has had a chance to review them.  We understand that in some cases there may be results that are confusing or concerning to you. Not all laboratory results come back in the same time frame and the provider may be waiting for multiple results in order to interpret others.  Please give us  48 hours in order for your provider to thoroughly review all the results before contacting the office for clarification of your results.   _______________________________________________________  If your blood pressure at your visit was 140/90 or greater, please contact your primary care physician to follow up on this.  _______________________________________________________  If you are age 80 or older, your body mass index should be between 23-30. Your Body mass index is 27.38 kg/m. If this is out of the aforementioned range listed, please consider follow up with your Primary Care Provider.  If you are age 80 or younger, your body mass index should be between 19-25. Your Body mass index is 27.38 kg/m. If this is out of the aformentioned range listed, please consider follow up with your Primary Care Provider.   ________________________________________________________  The [place] providers would like to encourage you to  use MYCHART to communicate with providers for non-urgent requests or questions.  Due to long hold times on the telephone, sending your provider a message by [facility] may be a faster and  more efficient way to get a response.  Please allow 48 business hours for a response.  Please remember that this is for non-urgent requests.  _______________________________________________________ Thank you for trusting me with your gastrointestinal care!   Brigitte Canard, PA

## 2023-08-13 ENCOUNTER — Encounter (HOSPITAL_COMMUNITY)
Admission: RE | Admit: 2023-08-13 | Discharge: 2023-08-13 | Disposition: A | Discharge status: Home / Self Care | Source: Ambulatory Visit | Attending: Internal Medicine | Admitting: Internal Medicine

## 2023-08-13 DIAGNOSIS — Z9889 Other specified postprocedural states: Secondary | ICD-10-CM

## 2023-08-13 LAB — IRON,TIBC AND FERRITIN PANEL
%SAT: 17 % — ABNORMAL LOW (ref 20–48)
Ferritin: 35 ng/mL (ref 24–380)
Iron: 50 ug/dL (ref 50–180)
TIBC: 301 ug/dL (ref 250–425)

## 2023-08-13 NOTE — Progress Notes (Addendum)
 Pt in today for Cardiac rehab.  Pt noted to telemetry to be in afib with heart rate in the low 100's.  Pt asymptomatic but aware that his heart is beating irregular.  BP 118/76. O2 sat 97%. Pt with known history of afib, compliant with his medication and has not missed taking his Eliquis .  Pt mention he had lab work completed on yesterday. Strips reviewed with supervising provider, Slater Duncan NP. Plan to send strips to Dr. Lorie Rook for awareness and hold on exercise until cleared to return.  Relayed this information to the pt. Reviewed with pt symptoms that would prompt immediate emergency help ie chest pain, shortness of breath, dizziness or impending doom alert 911 immediately. Pt verbalized understanding. Lettie Ray, BSN Cardiac and Emergency planning/management officer

## 2023-08-14 ENCOUNTER — Telehealth (HOSPITAL_COMMUNITY): Payer: Self-pay | Admitting: *Deleted

## 2023-08-14 ENCOUNTER — Telehealth: Payer: Self-pay | Admitting: *Deleted

## 2023-08-14 NOTE — Telephone Encounter (Signed)
 Called to advise pt of Dr. Lorie Rook response to strips sent yesterday of his return to afib.  Will hold on exercise for one week.  Appts canceled through 5/16. Will add these appt on the end to make up.  Tentative grad date 6/30.  Pt should anticipate a call/message through Mychart regarding medication change for his Amiodarone .  Pt further advised since he is holding on exercise to also take it easy this week. [name] [name] is very active with care for his property and horses. Verbalized understanding and thanked me for my call. Lettie Ray, BSN Cardiac and Emergency planning/management officer

## 2023-08-14 NOTE — Telephone Encounter (Signed)
-----   Message from Arun K Thukkani sent at 08/13/2023  5:12 PM EDT ----- Increase amio to 200 bid.  He should be able to go back to exercise after he has been on this dose for a week.  Thanks. ----- Message ----- From: Nickie Barnes, RN Sent: 08/13/2023   4:26 PM EDT To: Arun K Thukkani, MD  Dr. Lorie Rook Please review strips from today at cardiac rehab.  Pt back in afib asymptomatic but aware of the irregular beats.  Felt like it started today.  Unloaded a truck of mulch and did some spreading this morning before coming into exercise.  No change in routine.  BP 118/76.  Because of the higher rate upper 100's and low 110's at rest -  exercise on hold pending your clearance to return.

## 2023-08-14 NOTE — Telephone Encounter (Signed)
 Left message for patient to call back

## 2023-08-14 NOTE — Telephone Encounter (Signed)
  [facility] Pre-operative Risk Assessment     Request for surgical clearance:     Endoscopy Procedure  What type of surgery is being performed?     colonoscopy  When is this surgery scheduled?     To be determined  What type of clearance is required ?   Pharmacy  Are there any medications that need to be held prior to surgery and how long? Eliquis  2 days  Practice name and name of physician performing surgery?      [facility]  What is your office phone and fax number?      Phone- [phone]  Fax- [phone]  Anesthesia type (None, local, MAC, general) ?       MAC   Please route your response to Lianne Redo, CMA

## 2023-08-14 NOTE — Telephone Encounter (Signed)
Please advise holding Eliquis prior to colonoscopy.  Thank you!  DW  

## 2023-08-14 NOTE — Telephone Encounter (Signed)
-----   Message from Arun K Thukkani sent at 08/14/2023  9:01 AM EDT ----- I would still do what I had sent.  Thanks ----- Message ----- From: Nickie Barnes, RN Sent: 08/14/2023   8:38 AM EDT To: Lowell Rude, RN; Arun K Thukkani, MD  Spoke to pt.  He thinks he converted back to SR around 6:00 yesterday. How best to proceed? ----- Message ----- From: Thukkani, Arun K, MD Sent: 08/13/2023   5:13 PM EDT To: Nickie Barnes, RN; Lowell Rude, RN  Increase amio to 200 bid.  He should be able to go back to exercise after he has been on this dose for a week.  Thanks. ----- Message ----- From: Nickie Barnes, RN Sent: 08/13/2023   4:26 PM EDT To: Arun K Thukkani, MD  Dr. Lorie Rook Please review strips from today at cardiac rehab.  Pt back in afib asymptomatic but aware of the irregular beats.  Felt like it started today.  Unloaded a truck of mulch and did some spreading this morning before coming into exercise.  No change in routine.  BP 118/76.  Because of the higher rate upper 100's and low 110's at rest -  exercise on hold pending your clearance to return.

## 2023-08-15 ENCOUNTER — Encounter (HOSPITAL_COMMUNITY)

## 2023-08-15 NOTE — Telephone Encounter (Signed)
 Per Dr Leonia Raman ok not to schedule now, patient had recent AFIB Ablation

## 2023-08-15 NOTE — Telephone Encounter (Signed)
 Please schedule colonoscopy at the hospital for polypectomy/EMR after first week of June 2025.  Thank you

## 2023-08-15 NOTE — Telephone Encounter (Signed)
   Name: [name] [name]  DOB: 05/09/1945  MRN: [id number]  Primary Cardiologist: Arun K Thukkani, MD   Preoperative team, please contact this patient and set up a phone call appointment for further preoperative risk assessment. Please obtain consent and complete medication review. Thank you for your help.  I confirm that guidance regarding antiplatelet and oral anticoagulation therapy has been completed and, if necessary, noted below.  Per pharm D, Patient has had an Afib/aflutter ablation within the last 3 months or DCCV within the last 30 days   Due to elevated risk score including CVA, recommend one day Eliquis  hold. Procedure must be scheduled after 08/31/23.   I also confirmed the patient resides in the state of [place] . As per [facility] Medical Board telemedicine laws, the patient must reside in the state in which the provider is licensed.   Morey Ar, NP 08/15/2023, 9:42 AM [facility]

## 2023-08-15 NOTE — Telephone Encounter (Signed)
   Name: [name] [name]  DOB: 07/03/1945  MRN: [id number]   Primary Cardiologist: Arun K Thukkani, MD  Chart reviewed as part of pre-operative protocol coverage.  Per Pharm D, patient has had an Afib/aflutter ablation within the last 3 months or DCCV within the last 30 days   Due to elevated risk score including CVA, recommend one day Eliquis  hold. Procedure must be scheduled after 08/31/23.     I will route this recommendation to the requesting party via Epic fax function and remove from pre-op pool. Please call with questions.  Morey Ar, NP 08/15/2023, 9:59 AM

## 2023-08-15 NOTE — Telephone Encounter (Addendum)
 I will send notes to Dr. Leonia Raman to see notes from preop APP Morey Ar, NP. Clearance request was sent to our office as for pharmacy clearance only.    If pt calls back to schedule appt, pt can please just disregard that we have sent notes to GI office; pt cleared but cannot proceed with GI procedure until after 08/31/23 due to recent a-fib ablation. Pt can hold his Eliquis  x 1 day prior to procedure.

## 2023-08-15 NOTE — Telephone Encounter (Signed)
 Left message to call back to schedule tele preop appt. See notes from preop APP tele appt cannot be before 08/31/23, please see all notes.

## 2023-08-15 NOTE — Telephone Encounter (Signed)
 Patient with diagnosis of A Fib on Eliquis  for anticoagulation. Mitral valve repair on 05/27/23. DCCV on 06/03/23. Patient will not be able to hold Eliquis  before 08/31/23.  Procedure: colonoscopy Date of procedure: TBD   CHA2DS2-VASc Score = 7  This indicates a 11.2% annual risk of stroke. The patient's score is based upon: CHF History: 1 HTN History: 1 Diabetes History: 0 Stroke History: 2 Vascular Disease History: 1 Age Score: 2 Gender Score: 0    CrCl 70 ml/min Platelet count 251K  Patient has had an Afib/aflutter ablation within the last 3 months or DCCV within the last 30 days  Due to elevated risk score including CVA, recommend one day Eliquis  hold. Procedure must be scheduled after 08/31/23.   **This guidance is not considered finalized until pre-operative APP has relayed final recommendations.**

## 2023-08-18 ENCOUNTER — Encounter (HOSPITAL_COMMUNITY)

## 2023-08-20 ENCOUNTER — Encounter (HOSPITAL_COMMUNITY)

## 2023-08-22 ENCOUNTER — Telehealth: Payer: Self-pay

## 2023-08-22 ENCOUNTER — Telehealth: Payer: Self-pay | Admitting: Physician Assistant

## 2023-08-22 ENCOUNTER — Encounter (HOSPITAL_COMMUNITY)
Admission: RE | Admit: 2023-08-22 | Discharge: 2023-08-22 | Disposition: A | Discharge status: Home / Self Care | Source: Ambulatory Visit | Attending: Internal Medicine | Admitting: Internal Medicine

## 2023-08-22 DIAGNOSIS — Z9889 Other specified postprocedural states: Secondary | ICD-10-CM | POA: Diagnosis not present

## 2023-08-22 NOTE — Addendum Note (Signed)
 Addended by: Lanora Plana on: 08/22/2023 03:39 PM   Modules accepted: Orders

## 2023-08-22 NOTE — Progress Notes (Signed)
 Mr Pustejovsky returned to cardiac rehab and exercised without difficulty. Telemetry rhythm Apaced.Monte Antonio RN BSN

## 2023-08-22 NOTE — Telephone Encounter (Signed)
 [name] [name],  I received a phone call from speech-language pathology at [facility] [place], [place] [phone]).  This patient has been scheduled for a modified barium swallow test.  I actually ordered a regular barium swallow with tablet to evaluate solid food dysphagia.  They are canceling the modified barium swallow test.  We need to order a regular barium swallow with tablet and notify patient when that is scheduled. Brigitte Canard, PA-C

## 2023-08-22 NOTE — Telephone Encounter (Signed)
 I called pt to inform him that his Barium Swallow has been rescheduled to May 20 th at 9:30 am. He did not answer but a voicemail was left.

## 2023-08-25 ENCOUNTER — Encounter (HOSPITAL_COMMUNITY)
Admission: RE | Admit: 2023-08-25 | Discharge: 2023-08-25 | Disposition: A | Discharge status: Home / Self Care | Source: Ambulatory Visit | Attending: Internal Medicine | Admitting: Internal Medicine

## 2023-08-25 DIAGNOSIS — Z9889 Other specified postprocedural states: Secondary | ICD-10-CM

## 2023-08-25 NOTE — Telephone Encounter (Signed)
 Patient called and stated that he received a call from [facility] and would like for [facility] to return his phone call if all possible. Please advise.

## 2023-08-26 ENCOUNTER — Encounter (HOSPITAL_COMMUNITY)

## 2023-08-26 ENCOUNTER — Other Ambulatory Visit: Payer: Self-pay | Admitting: Physician Assistant

## 2023-08-26 ENCOUNTER — Ambulatory Visit (HOSPITAL_COMMUNITY)
Admission: RE | Admit: 2023-08-26 | Discharge: 2023-08-26 | Disposition: A | Discharge status: Home / Self Care | Source: Ambulatory Visit | Attending: Physician Assistant | Admitting: Physician Assistant

## 2023-08-26 DIAGNOSIS — R1310 Dysphagia, unspecified: Secondary | ICD-10-CM | POA: Diagnosis not present

## 2023-08-26 DIAGNOSIS — Z860101 Personal history of adenomatous and serrated colon polyps: Secondary | ICD-10-CM

## 2023-08-26 DIAGNOSIS — D649 Anemia, unspecified: Secondary | ICD-10-CM | POA: Insufficient documentation

## 2023-08-26 DIAGNOSIS — E559 Vitamin D deficiency, unspecified: Secondary | ICD-10-CM

## 2023-08-26 DIAGNOSIS — K224 Dyskinesia of esophagus: Secondary | ICD-10-CM | POA: Diagnosis not present

## 2023-08-26 DIAGNOSIS — K219 Gastro-esophageal reflux disease without esophagitis: Secondary | ICD-10-CM | POA: Diagnosis not present

## 2023-08-26 DIAGNOSIS — K559 Vascular disorder of intestine, unspecified: Secondary | ICD-10-CM

## 2023-08-26 DIAGNOSIS — Z9889 Other specified postprocedural states: Secondary | ICD-10-CM | POA: Insufficient documentation

## 2023-08-26 NOTE — Telephone Encounter (Signed)
 Left message for the patient to return my call as soon as possible, We have 8-11 available

## 2023-08-26 NOTE — Telephone Encounter (Signed)
 We have August 17th available at [facility]

## 2023-08-27 ENCOUNTER — Encounter (HOSPITAL_COMMUNITY)
Admission: RE | Admit: 2023-08-27 | Discharge: 2023-08-27 | Disposition: A | Discharge status: Home / Self Care | Source: Ambulatory Visit | Attending: Internal Medicine | Admitting: Internal Medicine

## 2023-08-27 ENCOUNTER — Ambulatory Visit: Payer: Self-pay | Admitting: Physician Assistant

## 2023-08-27 DIAGNOSIS — Z9889 Other specified postprocedural states: Secondary | ICD-10-CM | POA: Diagnosis not present

## 2023-08-27 NOTE — Progress Notes (Signed)
 Cardiac Individual Treatment Plan  Patient Details  Name: [name] [name] MRN: [id number] Date of Birth: Sep 14, 1945 Referring Provider:   Flowsheet Row INTENSIVE CARDIAC REHAB ORIENT from 08/05/2023 in [facility]  Referring Provider Kyra Phy, MD       Initial Encounter Date:  Flowsheet Row INTENSIVE CARDIAC REHAB ORIENT from 08/05/2023 in [facility]  Date 08/05/23       Visit Diagnosis: 05/27/23 mitral valve repair  05/27/23 tricuspid valve repair  Patient's Home Medications on Admission:  Current Outpatient Medications:    acetaminophen  (TYLENOL ) 325 MG tablet, Take 1-2 tablets (325-650 mg total) by mouth every 4 (four) hours as needed for mild pain (pain score 1-3). (Patient not taking: Reported on 08/12/2023), Disp: , Rfl:    amiodarone  (PACERONE ) 200 MG tablet, Take 1 tablet (200 mg total) by mouth daily., Disp: 90 tablet, Rfl: 0   apixaban  (ELIQUIS ) 5 MG TABS tablet, Take 1 tablet (5 mg total) by mouth 2 (two) times daily., Disp: 180 tablet, Rfl: 0   aspirin  EC 81 MG tablet, Take 1 tablet (81 mg total) by mouth daily. Swallow whole. (Patient not taking: Reported on 08/05/2023), Disp: , Rfl:    atorvastatin  (LIPITOR) 20 MG tablet, Take 1 tablet (20 mg total) by mouth daily., Disp: 30 tablet, Rfl: 0   Fe Fum-Vit C-Vit B12-FA (TRIGELS-F FORTE) CAPS capsule, Take 1 capsule by mouth daily. (Patient not taking: Reported on 08/12/2023), Disp: 30 capsule, Rfl: 0   furosemide  (LASIX ) 20 MG tablet, Take 1 tablet (20 mg total) by mouth daily as needed. (Patient not taking: Reported on 08/12/2023), Disp: , Rfl:    pantoprazole  (PROTONIX ) 40 MG tablet, Take 1 tablet (40 mg total) by mouth 2 (two) times daily. (Patient not taking: Reported on 08/12/2023), Disp: 60 tablet, Rfl: 0   potassium chloride  SA (KLOR-CON  M20) 20 MEQ tablet, Take 1 tablet (20 mEq total) by mouth daily as  needed. (Patient not taking: Reported on 08/12/2023), Disp: , Rfl:    sulfamethoxazole -trimethoprim  (BACTRIM  DS) 800-160 MG tablet, Take 1 tablet by mouth 2 (two) times daily. (Patient not taking: Reported on 08/12/2023), Disp: 14 tablet, Rfl: 0   Vitamin D , Ergocalciferol , (DRISDOL ) 1.25 MG (50000 UNIT) CAPS capsule, Take 1 capsule (50,000 Units total) by mouth every 7 (seven) days. (Patient not taking: Reported on 08/12/2023), Disp: 5 capsule, Rfl: 0  Past Medical History: Past Medical History:  Diagnosis Date   Arthritis    Back   CVA (cerebral infarction) 07/14/2009   TIA   Dysrhythmia    A. Fib   Heart murmur    History of kidney stones    Hyperlipidemia    Hypertension    Nephrolithiasis    Paroxysmal atrial fibrillation (HCC)    Paroxysmal atrial flutter (HCC)    a. s/p ablation 2012.   Prostate cancer (HCC)    Stroke (HCC)    TIA - no deficits    Tobacco Use: Social History   Tobacco Use  Smoking Status Never  Smokeless Tobacco Never    Labs: Review Flowsheet  More data exists      Latest Ref Rng & Units 05/27/2023 05/28/2023 05/30/2023 05/31/2023 06/01/2023  Labs for ITP Cardiac and Pulmonary Rehab  PH, Arterial 7.35 - 7.45 7.314  7.263  7.296  7.375  7.407  7.324  - - - 7.510   PCO2 arterial 32 - 48 mmHg 41.6  38.0  40.9  37.6  38.3  45.6  - - - 41.1   Bicarbonate 20.0 - 28.0 mmol/L 21.3  17.3  20.1  22.0  24.1  23.7  - - - 32.8   TCO2 22 - 32 mmol/L 23  18  21  18  23  25  28  26  25  25  25  24   - - 34   Acid-base deficit 0.0 - 2.0 mmol/L 5.0  9.0  6.0  3.0  3.0  - - - -  O2 Saturation % 90  98  94  99  100  100  - 68.3  63.4  94     Details       Multiple values from one day are sorted in reverse-chronological order         Capillary Blood Glucose: Lab Results  Component Value Date   GLUCAP 73 06/16/2023   GLUCAP 93 06/15/2023   GLUCAP 99 06/15/2023   GLUCAP 126 (H) 06/15/2023   GLUCAP 134 (H) 06/15/2023     Exercise Target Goals: Exercise Program  Goal: Individual exercise prescription set using results from initial 6 min walk test and THRR while considering  patient's activity barriers and safety.   Exercise Prescription Goal: Initial exercise prescription builds to 30-45 minutes a day of aerobic activity, 2-3 days per week.  Home exercise guidelines will be given to patient during program as part of exercise prescription that the participant will acknowledge.  Activity Barriers & Risk Stratification:  Activity Barriers & Cardiac Risk Stratification - 08/05/23 1342       Activity Barriers & Cardiac Risk Stratification   Activity Barriers None    Cardiac Risk Stratification Low             6 Minute Walk:  6 Minute Walk     Row Name 08/05/23 1423         6 Minute Walk   Phase Initial     Distance 1282 feet     Walk Time 6 minutes     # of Rest Breaks 0     MPH 2.43     METS 2.34     RPE 9     Perceived Dyspnea  9     VO2 Peak 8.2     Symptoms Yes (comment)     Comments Mild shortness of breath.     Resting HR 69 bpm     Resting BP 122/78     Resting Oxygen  Saturation  98 %     Exercise Oxygen  Saturation  during 6 min walk 97 %     Max Ex. HR 84 bpm     Max Ex. BP 148/82     2 Minute Post BP 142/84              Oxygen  Initial Assessment:   Oxygen  Re-Evaluation:   Oxygen  Discharge (Final Oxygen  Re-Evaluation):   Initial Exercise Prescription:  Initial Exercise Prescription - 08/05/23 1500       Date of Initial Exercise RX and Referring Provider   Date 08/05/23    Referring Provider Thukkani, Arun K, MD    Expected Discharge Date 10/29/23      Recumbant Bike   Level 1    Watts 10    Minutes 15    METs 2.2      NuStep   Level 1    SPM 85    Minutes 15    METs 2.2  Prescription Details   Frequency (times per week) 3    Duration Progress to 30 minutes of continuous aerobic without signs/symptoms of physical distress      Intensity   THRR 40-80% of Max Heartrate 56-112     Ratings of Perceived Exertion 11-13      Progression   Progression Continue to progress workloads to maintain intensity without signs/symptoms of physical distress.      Resistance Training   Training Prescription Yes    Weight 3 lbs    Reps 10-15             Perform Capillary Blood Glucose checks as needed.  Exercise Prescription Changes:   Exercise Prescription Changes     Row Name 08/11/23 1505             Response to Exercise   Blood Pressure (Admit) 118/70       Blood Pressure (Exercise) 136/82       Blood Pressure (Exit) 122/72       Heart Rate (Admit) 70 bpm       Heart Rate (Exercise) 77 bpm       Heart Rate (Exit) 70 bpm       Rating of Perceived Exertion (Exercise) 11       Symptoms None       Comments Off to a great start with exercise.       Duration Continue with 30 min of aerobic exercise without signs/symptoms of physical distress.       Intensity THRR unchanged         Progression   Progression Continue to progress workloads to maintain intensity without signs/symptoms of physical distress.       Average METs 2.1         Resistance Training   Training Prescription Yes       Weight 3 lbs       Reps 10-15       Time 10 Minutes         Interval Training   Interval Training No         Recumbant Bike   Level 1       RPM 42       Watts 46       Minutes 15       METs 2.3         Recumbant Elliptical   Level 1       RPM 68       Watts 12       Minutes 15       METs 1.9                Exercise Comments:   Exercise Comments     Row Name 08/11/23 1605           Exercise Comments Patient tolerated low intensity exercise well without symptoms. Oriented him to the exercise equipment and stretching routine.                Exercise Goals and Review:   Exercise Goals     Row Name 08/05/23 1342             Exercise Goals   Increase Physical Activity Yes       Intervention Provide advice, education, support and  counseling about physical activity/exercise needs.;Develop an individualized exercise prescription for aerobic and resistive training based on initial evaluation findings, risk stratification, comorbidities and participant's personal goals.       Expected  Outcomes Short Term: Attend rehab on a regular basis to increase amount of physical activity.;Long Term: Exercising regularly at least 3-5 days a week.;Long Term: Add in home exercise to make exercise part of routine and to increase amount of physical activity.       Increase Strength and Stamina Yes       Intervention Provide advice, education, support and counseling about physical activity/exercise needs.;Develop an individualized exercise prescription for aerobic and resistive training based on initial evaluation findings, risk stratification, comorbidities and participant's personal goals.       Expected Outcomes Short Term: Increase workloads from initial exercise prescription for resistance, speed, and METs.;Short Term: Perform resistance training exercises routinely during rehab and add in resistance training at home;Long Term: Improve cardiorespiratory fitness, muscular endurance and strength as measured by increased METs and functional capacity ( )       Able to understand and use rate of perceived exertion (RPE) scale Yes       Intervention Provide education and explanation on how to use RPE scale       Expected Outcomes Short Term: Able to use RPE daily in rehab to express subjective intensity level;Long Term:  Able to use RPE to guide intensity level when exercising independently       Knowledge and understanding of Target Heart Rate Range (THRR) Yes       Intervention Provide education and explanation of THRR including how the numbers were predicted and where they are located for reference       Expected Outcomes Short Term: Able to state/look up THRR;Long Term: Able to use THRR to govern intensity when exercising independently;Short Term:  Able to use daily as guideline for intensity in rehab       Able to check pulse independently Yes       Intervention Provide education and demonstration on how to check pulse in carotid and radial arteries.;Review the importance of being able to check your own pulse for safety during independent exercise       Expected Outcomes Short Term: Able to explain why pulse checking is important during independent exercise;Long Term: Able to check pulse independently and accurately       Understanding of Exercise Prescription Yes       Intervention Provide education, explanation, and written materials on patient's individual exercise prescription       Expected Outcomes Short Term: Able to explain program exercise prescription;Long Term: Able to explain home exercise prescription to exercise independently                Exercise Goals Re-Evaluation :  Exercise Goals Re-Evaluation     Row Name 08/11/23 1605             Exercise Goal Re-Evaluation   Exercise Goals Review Increase Physical Activity;Increase Strength and Stamina;Able to understand and use rate of perceived exertion (RPE) scale       Comments Patient was able to understand and use RPE scale appropriately.       Expected Outcomes Progress workloads as tolerated to help increase strength and stamina.                Discharge Exercise Prescription (Final Exercise Prescription Changes):  Exercise Prescription Changes - 08/11/23 1505       Response to Exercise   Blood Pressure (Admit) 118/70    Blood Pressure (Exercise) 136/82    Blood Pressure (Exit) 122/72    Heart Rate (Admit) 70 bpm    Heart Rate (Exercise)  77 bpm    Heart Rate (Exit) 70 bpm    Rating of Perceived Exertion (Exercise) 11    Symptoms None    Comments Off to a great start with exercise.    Duration Continue with 30 min of aerobic exercise without signs/symptoms of physical distress.    Intensity THRR unchanged      Progression   Progression Continue  to progress workloads to maintain intensity without signs/symptoms of physical distress.    Average METs 2.1      Resistance Training   Training Prescription Yes    Weight 3 lbs    Reps 10-15    Time 10 Minutes      Interval Training   Interval Training No      Recumbant Bike   Level 1    RPM 42    Watts 46    Minutes 15    METs 2.3      Recumbant Elliptical   Level 1    RPM 68    Watts 12    Minutes 15    METs 1.9             Nutrition:  Target Goals: Understanding of nutrition guidelines, daily intake of sodium 1500mg , cholesterol 200mg , calories 30% from fat and 7% or less from saturated fats, daily to have 5 or more servings of fruits and vegetables.  Biometrics:  Pre Biometrics - 08/05/23 1254       Pre Biometrics   Waist Circumference 40.75 inches    Hip Circumference 40.25 inches    Waist to Hip Ratio 1.01 %    Triceps Skinfold 18 mm    % Body Fat 28.7 %    Grip Strength 34 kg    Flexibility 0 in   knees bent   Single Leg Stand 30 seconds              Nutrition Therapy Plan and Nutrition Goals:  Nutrition Therapy & Goals - 08/12/23 1137       Nutrition Therapy   Diet Heart Healthy Diet    Drug/Food Interactions Statins/Certain Fruits      Personal Nutrition Goals   Nutrition Goal Patient to identify strategies for reducing cardiovascular risk by attending the Pritikin education and nutrition series weekly.    Personal Goal #2 Patient to improve diet quality by using the plate method as a guide for meal planning to include lean protein/plant protein, fruits, vegetables, whole grains, nonfat dairy as part of a well-balanced diet.    Personal Goal #3 Patient to reduce sodium to 1500mg  per day.    Comments Bud has medical history of MV + TV repair, HFrEF, TIA, HTN. Lipids are well controlled. Losartan  added at 07/25/23 cardiology visit to aid with improved blood pressure control. Patient will benefit from participation in intensive cardiac  rehab for nutrition, exercise, and lifestyle modification.      Intervention Plan   Intervention Prescribe, educate and counsel regarding individualized specific dietary modifications aiming towards targeted core components such as weight, hypertension, lipid management, diabetes, heart failure and other comorbidities.;Nutrition handout(s) given to patient.    Expected Outcomes Short Term Goal: Understand basic principles of dietary content, such as calories, fat, sodium, cholesterol and nutrients.;Long Term Goal: Adherence to prescribed nutrition plan.             Nutrition Assessments:  Nutrition Assessments - 08/14/23 1527       Rate Your Plate Scores   Pre Score 58  MEDIFICTS Score Key: >=70 Need to make dietary changes  40-70 Heart Healthy Diet <= 40 Therapeutic Level Cholesterol Diet   Flowsheet Row INTENSIVE CARDIAC REHAB from 08/13/2023 in [facility]  Picture Your Plate Total Score on Admission 58      Picture Your Plate Scores: <60 Unhealthy dietary pattern with much room for improvement. 41-50 Dietary pattern unlikely to meet recommendations for good health and room for improvement. 51-60 More healthful dietary pattern, with some room for improvement.  >60 Healthy dietary pattern, although there may be some specific behaviors that could be improved.    Nutrition Goals Re-Evaluation:  Nutrition Goals Re-Evaluation     Row Name 08/12/23 1137             Goals   Current Weight 191 lb 2.2 oz (86.7 kg)       Comment BMP WNL, BNP 1069, CBC improving, A1c 5.8, LDL 65, HDL 28       Expected Outcome Bud has medical history of MV + TV repair, HFrEF, TIA, HTN. Lipids are well controlled. Losartan  added at 07/25/23 cardiology visit to aid with improved blood pressure control. Patient will benefit from participation in intensive cardiac rehab for nutrition, exercise, and lifestyle modification.                 Nutrition Goals Re-Evaluation:  Nutrition Goals Re-Evaluation     Row Name 08/12/23 1137             Goals   Current Weight 191 lb 2.2 oz (86.7 kg)       Comment BMP WNL, BNP 1069, CBC improving, A1c 5.8, LDL 65, HDL 28       Expected Outcome Bud has medical history of MV + TV repair, HFrEF, TIA, HTN. Lipids are well controlled. Losartan  added at 07/25/23 cardiology visit to aid with improved blood pressure control. Patient will benefit from participation in intensive cardiac rehab for nutrition, exercise, and lifestyle modification.                Nutrition Goals Discharge (Final Nutrition Goals Re-Evaluation):  Nutrition Goals Re-Evaluation - 08/12/23 1137       Goals   Current Weight 191 lb 2.2 oz (86.7 kg)    Comment BMP WNL, BNP 1069, CBC improving, A1c 5.8, LDL 65, HDL 28    Expected Outcome Bud has medical history of MV + TV repair, HFrEF, TIA, HTN. Lipids are well controlled. Losartan  added at 07/25/23 cardiology visit to aid with improved blood pressure control. Patient will benefit from participation in intensive cardiac rehab for nutrition, exercise, and lifestyle modification.             Psychosocial: Target Goals: Acknowledge presence or absence of significant depression and/or stress, maximize coping skills, provide positive support system. Participant is able to verbalize types and ability to use techniques and skills needed for reducing stress and depression.  Initial Review & Psychosocial Screening:  Initial Psych Review & Screening - 08/05/23 1344       Initial Review   Current issues with None Identified      Family Dynamics   Good Support System? Yes    Comments Mr. Bushong has support from his wife.      Barriers   Psychosocial barriers to participate in program There are no identifiable barriers or psychosocial needs.      Screening Interventions   Interventions Encouraged to exercise;Provide feedback about the scores to  participant    Expected Outcomes Short Term goal: Identification and review with participant of any Quality of Life or Depression concerns found by scoring the questionnaire.;Long Term goal: The participant improves quality of Life and PHQ9 Scores as seen by post scores and/or verbalization of changes             Quality of Life Scores:  Quality of Life - 08/05/23 1516       Quality of Life   Select Quality of Life      Quality of Life Scores   Health/Function Pre 28 %    Socioeconomic Pre 29.69 %    Psych/Spiritual Pre 29.14 %    Family Pre 24 %    GLOBAL Pre 28.29 %            Scores of 19 and below usually indicate a poorer quality of life in these areas.  A difference of  2-3 points is a clinically meaningful difference.  A difference of 2-3 points in the total score of the Quality of Life Index has been associated with significant improvement in overall quality of life, self-image, physical symptoms, and general health in studies assessing change in quality of life.  PHQ-9: Review Flowsheet  More data exists      08/05/2023 07/09/2023 04/07/2023 10/31/2022 05/20/2022  Depression screen PHQ 2/9  Decreased Interest 0 0 0 0 0 0  [name], Depressed, Hopeless 0 0 0 0 0 0  PHQ - 2 Score 0 0 0 0 0 0  Altered sleeping 1 0 - - -  Tired, decreased energy 1 1 - - -  Change in appetite 0 0 - - -  Feeling bad or failure about yourself  0 0 - - -  Trouble concentrating 0 0 - - -  Moving slowly or fidgety/restless 0 0 - - -  Suicidal thoughts 0 0 - - -  PHQ-9 Score 2 1 - - -    Details       Multiple values from one day are sorted in reverse-chronological order        Interpretation of Total Score  Total Score Depression Severity:  1-4 = Minimal depression, 5-9 = Mild depression, 10-14 = Moderate depression, 15-19 = Moderately severe depression, 20-27 = Severe depression   Psychosocial Evaluation and Intervention:   Psychosocial Re-Evaluation:  Psychosocial Re-Evaluation      Row Name 08/11/23 1629 08/27/23 0748           Psychosocial Re-Evaluation   Current issues with None Identified None Identified      Interventions Encouraged to attend Cardiac Rehabilitation for the exercise Encouraged to attend Cardiac Rehabilitation for the exercise      Continue Psychosocial Services  No Follow up required No Follow up required               Psychosocial Discharge (Final Psychosocial Re-Evaluation):  Psychosocial Re-Evaluation - 08/27/23 0748       Psychosocial Re-Evaluation   Current issues with None Identified    Interventions Encouraged to attend Cardiac Rehabilitation for the exercise    Continue Psychosocial Services  No Follow up required             Vocational Rehabilitation: Provide vocational rehab assistance to qualifying candidates.   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            Education: Education Goals: Education classes will be provided on a weekly basis, covering required topics. Participant will state understanding/return demonstration of topics presented.    Education     Row Name 08/11/23 1600     Education   Cardiac Education Topics Pritikin   Nurse, children's Exercise Physiologist   Select Psychosocial   Psychosocial Healthy Minds, Bodies, Hearts   Instruction Review Code 1- Verbalizes Understanding   Class Start Time 1410   Class Stop Time 1445   Class Time Calculation (min) 35 min    Row Name 08/13/23 1500     Education   Cardiac Education Topics Pritikin   Customer service manager   Weekly Topic Adding Flavor - Sodium-Free   Instruction Review Code 1- Verbalizes Understanding   Class Start  Time 1400   Class Stop Time 1440   Class Time Calculation (min) 40 min    Row Name 08/22/23 1500     Education   Cardiac Education Topics Pritikin   Customer service manager   Weekly Topic Fast and Healthy Breakfasts   Instruction Review Code 1- Verbalizes Understanding   Class Start Time 1400   Class Stop Time 1440   Class Time Calculation (min) 40 min    Row Name 08/25/23 1400     Education   Cardiac Education Topics Pritikin   Hospital doctor Education   General Education Metabolic Syndrome and Belly Fat   Instruction Review Code 1- Verbalizes Understanding   Class Start Time 1355   Class Stop Time 1440   Class Time Calculation (min) 45 min            Core Videos: Exercise    Move It!  Clinical staff conducted group or individual video education with verbal and written material and guidebook.  Patient learns the recommended Pritikin exercise program. Exercise with the goal of living a long, healthy life. Some of the health benefits of exercise include controlled diabetes, healthier blood pressure levels, improved cholesterol levels, improved heart and lung capacity, improved sleep, and better body composition. Everyone should speak with their doctor before starting or changing an exercise routine.  Biomechanical Limitations Clinical staff conducted group or individual video education with verbal and written material and guidebook.  Patient learns how biomechanical limitations can impact exercise and how we can mitigate and possibly overcome limitations to have an impactful and balanced exercise routine.  Body Composition Clinical staff conducted group or individual video education with verbal and written material and guidebook.  Patient learns that body composition (ratio of muscle mass to fat mass) is a key component to assessing overall fitness, rather than  body weight alone. Increased fat mass, especially visceral belly fat, can put us  at increased risk for metabolic syndrome, type 2 diabetes, heart disease, and even death. It is recommended to combine diet and exercise (cardiovascular and resistance training) to improve your body composition. Seek guidance from your physician and exercise physiologist before implementing an exercise routine.  Exercise Action Plan Clinical staff conducted group or individual video education with verbal and written material and guidebook.  Patient learns the recommended strategies  to achieve and enjoy long-term exercise adherence, including variety, self-motivation, self-efficacy, and positive decision making. Benefits of exercise include fitness, good health, weight management, more energy, better sleep, less stress, and overall well-being.  Medical   Heart Disease Risk Reduction Clinical staff conducted group or individual video education with verbal and written material and guidebook.  Patient learns our heart is our most vital organ as it circulates oxygen , nutrients, white blood cells, and hormones throughout the entire body, and carries waste away. Data supports a plant-based eating plan like the Pritikin Program for its effectiveness in slowing progression of and reversing heart disease. The video provides a number of recommendations to address heart disease.   Metabolic Syndrome and Belly Fat  Clinical staff conducted group or individual video education with verbal and written material and guidebook.  Patient learns what metabolic syndrome is, how it leads to heart disease, and how one can reverse it and keep it from coming back. You have metabolic syndrome if you have 3 of the following 5 criteria: abdominal obesity, high blood pressure, high triglycerides, low HDL cholesterol, and high blood sugar.  Hypertension and Heart Disease Clinical staff conducted group or individual video education with verbal and  written material and guidebook.  Patient learns that high blood pressure, or hypertension, is very common in the [place] . Hypertension is largely due to excessive salt intake, but other important risk factors include being overweight, physical inactivity, drinking too much alcohol, smoking, and not eating enough potassium from fruits and vegetables. High blood pressure is a leading risk factor for heart attack, stroke, congestive heart failure, dementia, kidney failure, and premature death. Long-term effects of excessive salt intake include stiffening of the arteries and thickening of heart muscle and organ damage. Recommendations include ways to reduce hypertension and the risk of heart disease.  Diseases of Our Time - Focusing on Diabetes Clinical staff conducted group or individual video education with verbal and written material and guidebook.  Patient learns why the best way to stop diseases of our time is prevention, through food and other lifestyle changes. Medicine (such as prescription pills and surgeries) is often only a Band-Aid on the problem, not a long-term solution. Most common diseases of our time include obesity, type 2 diabetes, hypertension, heart disease, and cancer. The Pritikin Program is recommended and has been proven to help reduce, reverse, and/or prevent the damaging effects of metabolic syndrome.  Nutrition   Overview of the Pritikin Eating Plan  Clinical staff conducted group or individual video education with verbal and written material and guidebook.  Patient learns about the Pritikin Eating Plan for disease risk reduction. The Pritikin Eating Plan emphasizes a wide variety of unrefined, minimally-processed carbohydrates, like fruits, vegetables, whole grains, and legumes. Go, Caution, and Stop food choices are explained. Plant-based and lean animal proteins are emphasized. Rationale provided for low sodium intake for blood pressure control, low added sugars for blood  sugar stabilization, and low added fats and oils for coronary artery disease risk reduction and weight management.  Calorie Density  Clinical staff conducted group or individual video education with verbal and written material and guidebook.  Patient learns about calorie density and how it impacts the Pritikin Eating Plan. Knowing the characteristics of the food you choose will help you decide whether those foods will lead to weight gain or weight loss, and whether you want to consume more or less of them. Weight loss is usually a side effect of the Pritikin Eating Plan because of its  focus on low calorie-dense foods.  Label Reading  Clinical staff conducted group or individual video education with verbal and written material and guidebook.  Patient learns about the Pritikin recommended label reading guidelines and corresponding recommendations regarding calorie density, added sugars, sodium content, and whole grains.  Dining Out - Part 1  Clinical staff conducted group or individual video education with verbal and written material and guidebook.  Patient learns that restaurant meals can be sabotaging because they can be so high in calories, fat, sodium, and/or sugar. Patient learns recommended strategies on how to positively address this and avoid unhealthy pitfalls.  Facts on Fats  Clinical staff conducted group or individual video education with verbal and written material and guidebook.  Patient learns that lifestyle modifications can be just as effective, if not more so, as many medications for lowering your risk of heart disease. A Pritikin lifestyle can help to reduce your risk of inflammation and atherosclerosis (cholesterol build-up, or plaque, in the artery walls). Lifestyle interventions such as dietary choices and physical activity address the cause of atherosclerosis. A review of the types of fats and their impact on blood cholesterol levels, along with dietary recommendations to reduce  fat intake is also included.  Nutrition Action Plan  Clinical staff conducted group or individual video education with verbal and written material and guidebook.  Patient learns how to incorporate Pritikin recommendations into their lifestyle. Recommendations include planning and keeping personal health goals in mind as an important part of their success.  Healthy Mind-Set    Healthy Minds, Bodies, Hearts  Clinical staff conducted group or individual video education with verbal and written material and guidebook.  Patient learns how to identify when they are stressed. Video will discuss the impact of that stress, as well as the many benefits of stress management. Patient will also be introduced to stress management techniques. The way we think, act, and feel has an impact on our hearts.  How Our Thoughts Can Heal Our Hearts  Clinical staff conducted group or individual video education with verbal and written material and guidebook.  Patient learns that negative thoughts can cause depression and anxiety. This can result in negative lifestyle behavior and serious health problems. Cognitive behavioral therapy is an effective method to help control our thoughts in order to change and improve our emotional outlook.  Additional Videos:  Exercise    Improving Performance  Clinical staff conducted group or individual video education with verbal and written material and guidebook.  Patient learns to use a non-linear approach by alternating intensity levels and lengths of time spent exercising to help burn more calories and lose more body fat. Cardiovascular exercise helps improve heart health, metabolism, hormonal balance, blood sugar control, and recovery from fatigue. Resistance training improves strength, endurance, balance, coordination, reaction time, metabolism, and muscle mass. Flexibility exercise improves circulation, posture, and balance. Seek guidance from your physician and exercise  physiologist before implementing an exercise routine and learn your capabilities and proper form for all exercise.  Introduction to Yoga  Clinical staff conducted group or individual video education with verbal and written material and guidebook.  Patient learns about yoga, a discipline of the coming together of mind, breath, and body. The benefits of yoga include improved flexibility, improved range of motion, better posture and core strength, increased lung function, weight loss, and positive self-image. Yoga's heart health benefits include lowered blood pressure, healthier heart rate, decreased cholesterol and triglyceride levels, improved immune function, and reduced stress. Seek guidance from your  physician and exercise physiologist before implementing an exercise routine and learn your capabilities and proper form for all exercise.  Medical   Aging: Enhancing Your Quality of Life  Clinical staff conducted group or individual video education with verbal and written material and guidebook.  Patient learns key strategies and recommendations to stay in good physical health and enhance quality of life, such as prevention strategies, having an advocate, securing a Health Care Proxy and Power of Attorney, and keeping a list of medications and system for tracking them. It also discusses how to avoid risk for bone loss.  Biology of Weight Control  Clinical staff conducted group or individual video education with verbal and written material and guidebook.  Patient learns that weight gain occurs because we consume more calories than we burn (eating more, moving less). Even if your body weight is normal, you may have higher ratios of fat compared to muscle mass. Too much body fat puts you at increased risk for cardiovascular disease, heart attack, stroke, type 2 diabetes, and obesity-related cancers. In addition to exercise, following the Pritikin Eating Plan can help reduce your risk.  Decoding Lab Results   Clinical staff conducted group or individual video education with verbal and written material and guidebook.  Patient learns that lab test reflects one measurement whose values change over time and are influenced by many factors, including medication, stress, sleep, exercise, food, hydration, pre-existing medical conditions, and more. It is recommended to use the knowledge from this video to become more involved with your lab results and evaluate your numbers to speak with your doctor.   Diseases of Our Time - Overview  Clinical staff conducted group or individual video education with verbal and written material and guidebook.  Patient learns that according to the CDC, 50% to 70% of chronic diseases (such as obesity, type 2 diabetes, elevated lipids, hypertension, and heart disease) are avoidable through lifestyle improvements including healthier food choices, listening to satiety cues, and increased physical activity.  Sleep Disorders Clinical staff conducted group or individual video education with verbal and written material and guidebook.  Patient learns how good quality and duration of sleep are important to overall health and well-being. Patient also learns about sleep disorders and how they impact health along with recommendations to address them, including discussing with a physician.  Nutrition  Dining Out - Part 2 Clinical staff conducted group or individual video education with verbal and written material and guidebook.  Patient learns how to plan ahead and communicate in order to maximize their dining experience in a healthy and nutritious manner. Included are recommended food choices based on the type of restaurant the patient is visiting.   Fueling a Banker conducted group or individual video education with verbal and written material and guidebook.  There is a strong connection between our food choices and our health. Diseases like obesity and type 2 diabetes  are very prevalent and are in large-part due to lifestyle choices. The Pritikin Eating Plan provides plenty of food and hunger-curbing satisfaction. It is easy to follow, affordable, and helps reduce health risks.  Menu Workshop  Clinical staff conducted group or individual video education with verbal and written material and guidebook.  Patient learns that restaurant meals can sabotage health goals because they are often packed with calories, fat, sodium, and sugar. Recommendations include strategies to plan ahead and to communicate with the manager, chef, or server to help order a healthier meal.  Planning Your Eating Strategy  Clinical staff conducted group or individual video education with verbal and written material and guidebook.  Patient learns about the Pritikin Eating Plan and its benefit of reducing the risk of disease. The Pritikin Eating Plan does not focus on calories. Instead, it emphasizes high-quality, nutrient-rich foods. By knowing the characteristics of the foods, we choose, we can determine their calorie density and make informed decisions.  Targeting Your Nutrition Priorities  Clinical staff conducted group or individual video education with verbal and written material and guidebook.  Patient learns that lifestyle habits have a tremendous impact on disease risk and progression. This video provides eating and physical activity recommendations based on your personal health goals, such as reducing LDL cholesterol, losing weight, preventing or controlling type 2 diabetes, and reducing high blood pressure.  Vitamins and Minerals  Clinical staff conducted group or individual video education with verbal and written material and guidebook.  Patient learns different ways to obtain key vitamins and minerals, including through a recommended healthy diet. It is important to discuss all supplements you take with your doctor.   Healthy Mind-Set    Smoking Cessation  Clinical staff  conducted group or individual video education with verbal and written material and guidebook.  Patient learns that cigarette smoking and tobacco addiction pose a serious health risk which affects millions of people. Stopping smoking will significantly reduce the risk of heart disease, lung disease, and many forms of cancer. Recommended strategies for quitting are covered, including working with your doctor to develop a successful plan.  Culinary   Becoming a Set designer conducted group or individual video education with verbal and written material and guidebook.  Patient learns that cooking at home can be healthy, cost-effective, quick, and puts them in control. Keys to cooking healthy recipes will include looking at your recipe, assessing your equipment needs, planning ahead, making it simple, choosing cost-effective seasonal ingredients, and limiting the use of added fats, salts, and sugars.  Cooking - Breakfast and Snacks  Clinical staff conducted group or individual video education with verbal and written material and guidebook.  Patient learns how important breakfast is to satiety and nutrition through the entire day. Recommendations include key foods to eat during breakfast to help stabilize blood sugar levels and to prevent overeating at meals later in the day. Planning ahead is also a key component.  Cooking - Educational psychologist conducted group or individual video education with verbal and written material and guidebook.  Patient learns eating strategies to improve overall health, including an approach to cook more at home. Recommendations include thinking of animal protein as a side on your plate rather than center stage and focusing instead on lower calorie dense options like vegetables, fruits, whole grains, and plant-based proteins, such as beans. Making sauces in large quantities to freeze for later and leaving the skin on your vegetables are also  recommended to maximize your experience.  Cooking - Healthy Salads and Dressing Clinical staff conducted group or individual video education with verbal and written material and guidebook.  Patient learns that vegetables, fruits, whole grains, and legumes are the foundations of the Pritikin Eating Plan. Recommendations include how to incorporate each of these in flavorful and healthy salads, and how to create homemade salad dressings. Proper handling of ingredients is also covered. Cooking - Soups and [employer] - Soups and Desserts Clinical staff conducted group or individual video education with verbal and written material and guidebook.  Patient learns that Pritikin  soups and desserts make for easy, nutritious, and delicious snacks and meal components that are low in sodium, fat, sugar, and calorie density, while high in vitamins, minerals, and filling fiber. Recommendations include simple and healthy ideas for soups and desserts.   Overview     The Pritikin Solution Program Overview Clinical staff conducted group or individual video education with verbal and written material and guidebook.  Patient learns that the results of the Pritikin Program have been documented in more than 100 articles published in peer-reviewed journals, and the benefits include reducing risk factors for (and, in some cases, even reversing) high cholesterol, high blood pressure, type 2 diabetes, obesity, and more! An overview of the three key pillars of the Pritikin Program will be covered: eating well, doing regular exercise, and having a healthy mind-set.  WORKSHOPS  Exercise: Exercise Basics: Building Your Action Plan Clinical staff led group instruction and group discussion with PowerPoint presentation and patient guidebook. To enhance the learning environment the use of posters, models and videos may be added. At the conclusion of this workshop, patients will comprehend the difference between physical  activity and exercise, as well as the benefits of incorporating both, into their routine. Patients will understand the FITT (Frequency, Intensity, Time, and Type) principle and how to use it to build an exercise action plan. In addition, safety concerns and other considerations for exercise and cardiac rehab will be addressed by the presenter. The purpose of this lesson is to promote a comprehensive and effective weekly exercise routine in order to improve patients' overall level of fitness.   Managing Heart Disease: Your Path to a Healthier Heart Clinical staff led group instruction and group discussion with PowerPoint presentation and patient guidebook. To enhance the learning environment the use of posters, models and videos may be added.At the conclusion of this workshop, patients will understand the anatomy and physiology of the heart. Additionally, they will understand how Pritikin's three pillars impact the risk factors, the progression, and the management of heart disease.  The purpose of this lesson is to provide a high-level overview of the heart, heart disease, and how the Pritikin lifestyle positively impacts risk factors.  Exercise Biomechanics Clinical staff led group instruction and group discussion with PowerPoint presentation and patient guidebook. To enhance the learning environment the use of posters, models and videos may be added. Patients will learn how the structural parts of their bodies function and how these functions impact their daily activities, movement, and exercise. Patients will learn how to promote a neutral spine, learn how to manage pain, and identify ways to improve their physical movement in order to promote healthy living. The purpose of this lesson is to expose patients to common physical limitations that impact physical activity. Participants will learn practical ways to adapt and manage aches and pains, and to minimize their effect on regular exercise.  Patients will learn how to maintain good posture while sitting, walking, and lifting.  Balance Training and Fall Prevention  Clinical staff led group instruction and group discussion with PowerPoint presentation and patient guidebook. To enhance the learning environment the use of posters, models and videos may be added. At the conclusion of this workshop, patients will understand the importance of their sensorimotor skills (vision, proprioception, and the vestibular system) in maintaining their ability to balance as they age. Patients will apply a variety of balancing exercises that are appropriate for their current level of function. Patients will understand the common causes for poor balance, possible solutions to these  problems, and ways to modify their physical environment in order to minimize their fall risk. The purpose of this lesson is to teach patients about the importance of maintaining balance as they age and ways to minimize their risk of falling.  WORKSHOPS   Nutrition:  Fueling a Ship broker led group instruction and group discussion with PowerPoint presentation and patient guidebook. To enhance the learning environment the use of posters, models and videos may be added. Patients will review the foundational principles of the Pritikin Eating Plan and understand what constitutes a serving size in each of the food groups. Patients will also learn Pritikin-friendly foods that are better choices when away from home and review make-ahead meal and snack options. Calorie density will be reviewed and applied to three nutrition priorities: weight maintenance, weight loss, and weight gain. The purpose of this lesson is to reinforce (in a group setting) the key concepts around what patients are recommended to eat and how to apply these guidelines when away from home by planning and selecting Pritikin-friendly options. Patients will understand how calorie density may be adjusted for  different weight management goals.  Mindful Eating  Clinical staff led group instruction and group discussion with PowerPoint presentation and patient guidebook. To enhance the learning environment the use of posters, models and videos may be added. Patients will briefly review the concepts of the Pritikin Eating Plan and the importance of low-calorie dense foods. The concept of mindful eating will be introduced as well as the importance of paying attention to internal hunger signals. Triggers for non-hunger eating and techniques for dealing with triggers will be explored. The purpose of this lesson is to provide patients with the opportunity to review the basic principles of the Pritikin Eating Plan, discuss the value of eating mindfully and how to measure internal cues of hunger and fullness using the Hunger Scale. Patients will also discuss reasons for non-hunger eating and learn strategies to use for controlling emotional eating.  Targeting Your Nutrition Priorities Clinical staff led group instruction and group discussion with PowerPoint presentation and patient guidebook. To enhance the learning environment the use of posters, models and videos may be added. Patients will learn how to determine their genetic susceptibility to disease by reviewing their family history. Patients will gain insight into the importance of diet as part of an overall healthy lifestyle in mitigating the impact of genetics and other environmental insults. The purpose of this lesson is to provide patients with the opportunity to assess their personal nutrition priorities by looking at their family history, their own health history and current risk factors. Patients will also be able to discuss ways of prioritizing and modifying the Pritikin Eating Plan for their highest risk areas  Menu  Clinical staff led group instruction and group discussion with PowerPoint presentation and patient guidebook. To enhance the learning  environment the use of posters, models and videos may be added. Using menus brought in from [employer], or printed from [employer], patients will apply the Pritikin dining out guidelines that were presented in the [employer] video. Patients will also be able to practice these guidelines in a variety of provided scenarios. The purpose of this lesson is to provide patients with the opportunity to practice hands-on learning of the Pritikin Dining Out guidelines with actual menus and practice scenarios.  Label Reading Clinical staff led group instruction and group discussion with PowerPoint presentation and patient guidebook. To enhance the learning environment the use of posters,  models and videos may be added. Patients will review and discuss the Pritikin label reading guidelines presented in Pritikin's Label Reading Educational series video. Using fool labels brought in from local grocery stores and markets, patients will apply the label reading guidelines and determine if the packaged food meet the Pritikin guidelines. The purpose of this lesson is to provide patients with the opportunity to review, discuss, and practice hands-on learning of the Pritikin Label Reading guidelines with actual packaged food labels. Cooking School  Pritikin's [employer] are designed to teach patients ways to prepare quick, simple, and affordable recipes at home. The importance of nutrition's role in chronic disease risk reduction is reflected in its emphasis in the overall Pritikin program. By learning how to prepare essential core Pritikin Eating Plan recipes, patients will increase control over what they eat; be able to customize the flavor of foods without the use of added salt, sugar, or fat; and improve the quality of the food they consume. By learning a set of core recipes which are easily assembled, quickly prepared, and affordable, patients are more likely to prepare more healthy  foods at home. These workshops focus on convenient breakfasts, simple entres, side dishes, and desserts which can be prepared with minimal effort and are consistent with nutrition recommendations for cardiovascular risk reduction. Cooking [employer] are taught by a Armed forces logistics/support/administrative officer (RD) who has been trained by the [employer]. The chef or RD has a clear understanding of the importance of minimizing - if not completely eliminating - added fat, sugar, and sodium in recipes. Throughout the series of Cooking School Workshop sessions, patients will learn about healthy ingredients and efficient methods of cooking to build confidence in their capability to prepare    Cooking School weekly topics:  Adding Flavor- Sodium-Free  Fast and Healthy Breakfasts  Powerhouse Plant-Based Proteins  Satisfying Salads and Dressings  Simple Sides and Sauces  International Cuisine-Spotlight on the [employer] Zones  Delicious Desserts  Savory Soups  [employer] - Meals in a Astronomer Appetizers and Snacks  Comforting Weekend Breakfasts  One-Pot Wonders   Fast Evening Meals  Landscape architect Your [employer] Plate  WORKSHOPS   Healthy Mindset (Psychosocial):  Focused Goals, Sustainable Changes Clinical staff led group instruction and group discussion with PowerPoint presentation and patient guidebook. To enhance the learning environment the use of posters, models and videos may be added. Patients will be able to apply effective goal setting strategies to establish at least one personal goal, and then take consistent, meaningful action toward that goal. They will learn to identify common barriers to achieving personal goals and develop strategies to overcome them. Patients will also gain an understanding of how our mind-set can impact our ability to achieve goals and the importance of cultivating a positive and growth-oriented mind-set. The purpose of this lesson is to  provide patients with a deeper understanding of how to set and achieve personal goals, as well as the tools and strategies needed to overcome common obstacles which may arise along the way.  From Head to Heart: The Power of a Healthy Outlook  Clinical staff led group instruction and group discussion with PowerPoint presentation and patient guidebook. To enhance the learning environment the use of posters, models and videos may be added. Patients will be able to recognize and describe the impact of emotions and mood on physical health. They will discover the importance of self-care and explore self-care practices which may work for  them. Patients will also learn how to utilize the 4 C's to cultivate a healthier outlook and better manage stress and challenges. The purpose of this lesson is to demonstrate to patients how a healthy outlook is an essential part of maintaining good health, especially as they continue their cardiac rehab journey.  Healthy Sleep for a Healthy Heart Clinical staff led group instruction and group discussion with PowerPoint presentation and patient guidebook. To enhance the learning environment the use of posters, models and videos may be added. At the conclusion of this workshop, patients will be able to demonstrate knowledge of the importance of sleep to overall health, well-being, and quality of life. They will understand the symptoms of, and treatments for, common sleep disorders. Patients will also be able to identify daytime and nighttime behaviors which impact sleep, and they will be able to apply these tools to help manage sleep-related challenges. The purpose of this lesson is to provide patients with a general overview of sleep and outline the importance of quality sleep. Patients will learn about a few of the most common sleep disorders. Patients will also be introduced to the concept of "sleep hygiene," and discover ways to self-manage certain sleeping problems through  simple daily behavior changes. Finally, the workshop will motivate patients by clarifying the links between quality sleep and their goals of heart-healthy living.   Recognizing and Reducing Stress Clinical staff led group instruction and group discussion with PowerPoint presentation and patient guidebook. To enhance the learning environment the use of posters, models and videos may be added. At the conclusion of this workshop, patients will be able to understand the types of stress reactions, differentiate between acute and chronic stress, and recognize the impact that chronic stress has on their health. They will also be able to apply different coping mechanisms, such as reframing negative self-talk. Patients will have the opportunity to practice a variety of stress management techniques, such as deep abdominal breathing, progressive muscle relaxation, and/or guided imagery.  The purpose of this lesson is to educate patients on the role of stress in their lives and to provide healthy techniques for coping with it.  Learning Barriers/Preferences:  Learning Barriers/Preferences - 08/05/23 1344       Learning Barriers/Preferences   Learning Barriers None    Learning Preferences Audio;Computer/Internet;Group Instruction;Individual Instruction;Pictoral;Skilled Demonstration;Verbal Instruction;Video;Written Material             Education Topics:  Knowledge Questionnaire Score:  Knowledge Questionnaire Score - 08/05/23 1517       Knowledge Questionnaire Score   Pre Score 21/24             Core Components/Risk Factors/Patient Goals at Admission:  Personal Goals and Risk Factors at Admission - 08/05/23 1346       Core Components/Risk Factors/Patient Goals on Admission   Hypertension Yes    Intervention Provide education on lifestyle modifcations including regular physical activity/exercise, weight management, moderate sodium restriction and increased consumption of fresh fruit,  vegetables, and low fat dairy, alcohol moderation, and smoking cessation.;Monitor prescription use compliance.    Expected Outcomes Short Term: Continued assessment and intervention until BP is < 140/29mm HG in hypertensive participants. < 130/95mm HG in hypertensive participants with diabetes, heart failure or chronic kidney disease.;Long Term: Maintenance of blood pressure at goal levels.    Lipids Yes    Intervention Provide education and support for participant on nutrition & aerobic/resistive exercise along with prescribed medications to achieve LDL 70mg , HDL >40mg .    Expected Outcomes Short  Term: Participant states understanding of desired cholesterol values and is compliant with medications prescribed. Participant is following exercise prescription and nutrition guidelines.;Long Term: Cholesterol controlled with medications as prescribed, with individualized exercise RX and with personalized nutrition plan. Value goals: LDL < 70mg , HDL > 40 mg.    Personal Goal Other Yes    Personal Goal Improve upper & lower body strength, be able to pick things up from the floor, be able to tie shoes and/ or put socks on comfortably. Decrease intake of red meat and sweets. Visit more with friends. Slow [name] on work activities and take time to enjoy life.    Intervention Mr. Mcparland will participate in Exercise, Nutrition, and Healthy Mind-Set workshops. He will develop an Exercise Action Plan to help increase strength, stamina, and flexibility.    Expected Outcomes Mr Plemons will comply with Exercise and Nutrition Action plans to help achieve personal goals as measured by self-report and functional tests.             Core Components/Risk Factors/Patient Goals Review:   Goals and Risk Factor Review     Row Name 08/11/23 1630 08/27/23 0749           Core Components/Risk Factors/Patient Goals Review   Personal Goals Review Weight Management/Obesity;Hypertension;Lipids;Stress Weight  Management/Obesity;Hypertension;Lipids;Stress      Review Bud started cardiac rehab on 08/11/23. Bud did well with exercise. Vital signs were stable. Bud is off to a good start with with exercise. Vital signs have been  stable. Bud returned to exercise after being absent due to atrial fib.      Expected Outcomes Bud will continue to participate in cardiac rehab for exercise, nutrition and lifestyle modofications Bud will continue to participate in cardiac rehab for exercise, nutrition and lifestyle modofications               Core Components/Risk Factors/Patient Goals at Discharge (Final Review):   Goals and Risk Factor Review - 08/27/23 0749       Core Components/Risk Factors/Patient Goals Review   Personal Goals Review Weight Management/Obesity;Hypertension;Lipids;Stress    Review Bud is off to a good start with with exercise. Vital signs have been  stable. Bud returned to exercise after being absent due to atrial fib.    Expected Outcomes Bud will continue to participate in cardiac rehab for exercise, nutrition and lifestyle modofications             ITP Comments:  ITP Comments     Row Name 08/05/23 1254 08/11/23 1628 08/27/23 0746       ITP Comments Medical Director- Dr. Gaylyn Keas, MD. Introduction to the [facility] Education / Intensive Cardiac Rehab Program. Reviewed intital orientation folder with patient. 30 Day ITP Review. Donovin "Bud" started cardiac rehab on 08/11/23. Bud did well with exercise. 30 Day ITP Review. Yariel "Bud" returned to exercise at  cardiac rehab on 08/22/23. Bud is off to a good start with exercise.              Comments: See ITP comments.Monte Antonio RN BSN

## 2023-08-27 NOTE — Progress Notes (Signed)
 CARDIAC REHAB PHASE 2  Reviewed home exercise with pt today. Pt is tolerating exercise well. Pt will continue to exercise on his own by walking, golfing, stretch routine he already has, light hand weights, and chair fitness classes for 30 minutes per session (may need to break up into two 15 min sessions) 1-2 days a week in addition to the 3 days in CRP2. Advised pt on THRR, RPE scale, hydration and temperature/humidity precautions. Reinforced S/S to stop exercise and when to call MD vs 911. Encouraged warm up cool down and stretches with exercise sessions. Pt verbalized understanding, all questions were answered and pt was given a copy to take home.    Darl Edu ACSM-CEP 08/27/2023 4:42 PM

## 2023-08-28 ENCOUNTER — Telehealth: Payer: Self-pay | Admitting: Family Medicine

## 2023-08-28 ENCOUNTER — Other Ambulatory Visit: Payer: Self-pay | Admitting: Family Medicine

## 2023-08-28 MED ORDER — TRAMADOL HCL 50 MG PO TABS: 50.0000 mg | ORAL_TABLET | Freq: Three times a day (TID) | ORAL | 0 refills | Status: AC | PRN

## 2023-08-28 NOTE — Telephone Encounter (Signed)
 Last Fill: 05/27/23 Not on current med list  Last OV: 07/09/23 Next OV: None Scheduled  Routing to provider for review/authorization.

## 2023-08-28 NOTE — Telephone Encounter (Signed)
 Copied from CRM [id number]. Topic: Clinical - Medication Refill >> Aug 28, 2023  9:23 AM Ethelle Herb L wrote: Medication: tramadol   Patient requesting a 90 day supply.   Has the patient contacted their pharmacy? Yes  This is the patient's preferred pharmacy:  [facility] [address] [place] - [address] [place] [address] Phone: [phone] Fax: [phone]  Is this the correct pharmacy for this prescription? Yes   Has the prescription been filled recently? No  Is the patient out of the medication? Yes  Has the patient been seen for an appointment in the last year OR does the patient have an upcoming appointment? Yes  Can we respond through MyChart? Yes  Agent: Please be advised that Rx refills may take up to 3 business days. We ask that you follow-up with your pharmacy.

## 2023-08-29 ENCOUNTER — Encounter (HOSPITAL_COMMUNITY)
Admission: RE | Admit: 2023-08-29 | Discharge: 2023-08-29 | Disposition: A | Discharge status: Home / Self Care | Source: Ambulatory Visit | Attending: Internal Medicine | Admitting: Internal Medicine

## 2023-08-29 DIAGNOSIS — Z9889 Other specified postprocedural states: Secondary | ICD-10-CM

## 2023-09-03 ENCOUNTER — Ambulatory Visit: Admitting: Gastroenterology

## 2023-09-03 ENCOUNTER — Encounter (HOSPITAL_COMMUNITY)
Admission: RE | Admit: 2023-09-03 | Discharge: 2023-09-03 | Disposition: A | Discharge status: Home / Self Care | Source: Ambulatory Visit | Attending: Internal Medicine

## 2023-09-03 ENCOUNTER — Encounter: Payer: Self-pay | Admitting: *Deleted

## 2023-09-03 ENCOUNTER — Other Ambulatory Visit: Payer: Self-pay | Admitting: *Deleted

## 2023-09-03 DIAGNOSIS — Z860100 Personal history of colon polyps, unspecified: Secondary | ICD-10-CM

## 2023-09-03 DIAGNOSIS — Z860101 Personal history of adenomatous and serrated colon polyps: Secondary | ICD-10-CM

## 2023-09-03 DIAGNOSIS — Z9889 Other specified postprocedural states: Secondary | ICD-10-CM

## 2023-09-03 DIAGNOSIS — D126 Benign neoplasm of colon, unspecified: Secondary | ICD-10-CM

## 2023-09-03 MED ORDER — NA SULFATE-K SULFATE-MG SULF 17.5-3.13-1.6 GM/177ML PO SOLN: 1.0000 | Freq: Once | ORAL | 0 refills | Status: AC

## 2023-09-03 NOTE — Addendum Note (Signed)
 Addended by: Chrisandra Counts on: 09/03/2023 02:08 PM   Modules accepted: Orders

## 2023-09-03 NOTE — Telephone Encounter (Signed)
 Returned patients call and left message that he has been scheduled with Dr Leonia Raman on 8/11 at [facility]  I am mailing him his instructions and he can see them as well on My Chart.  When its a hospital case he does not get to choose his time because we have to take the time slots for the provider that the [facility] END[facility] has available on the provided day.   He can hold Eliquis  1 day  He is having a Colonoscopy/Polypectomy/EMR

## 2023-09-03 NOTE — Telephone Encounter (Signed)
 Inbound call from patient, states he could do 8/11. Would like a call back with the available times.

## 2023-09-03 NOTE — Telephone Encounter (Signed)
 Scheduled patient for Colonoscopy/Polypectomy/EMR 90 minutes on 11/17/2023 at 8 am

## 2023-09-05 ENCOUNTER — Encounter (HOSPITAL_COMMUNITY)
Admission: RE | Admit: 2023-09-05 | Discharge: 2023-09-05 | Disposition: A | Discharge status: Home / Self Care | Source: Ambulatory Visit | Attending: Internal Medicine | Admitting: Internal Medicine

## 2023-09-05 DIAGNOSIS — Z9889 Other specified postprocedural states: Secondary | ICD-10-CM

## 2023-09-08 ENCOUNTER — Encounter (HOSPITAL_COMMUNITY)
Admission: RE | Admit: 2023-09-08 | Discharge: 2023-09-08 | Disposition: A | Discharge status: Home / Self Care | Source: Ambulatory Visit | Attending: Internal Medicine | Admitting: Internal Medicine

## 2023-09-08 DIAGNOSIS — Z9889 Other specified postprocedural states: Secondary | ICD-10-CM | POA: Diagnosis not present

## 2023-09-10 ENCOUNTER — Ambulatory Visit: Admitting: Gastroenterology

## 2023-09-10 ENCOUNTER — Encounter (HOSPITAL_COMMUNITY)
Admission: RE | Admit: 2023-09-10 | Discharge: 2023-09-10 | Disposition: A | Discharge status: Home / Self Care | Source: Ambulatory Visit | Attending: Internal Medicine

## 2023-09-10 DIAGNOSIS — Z9889 Other specified postprocedural states: Secondary | ICD-10-CM

## 2023-09-11 NOTE — Progress Notes (Signed)
 Remote pacemaker transmission.

## 2023-09-12 ENCOUNTER — Encounter (HOSPITAL_COMMUNITY): Admission: RE | Admit: 2023-09-12 | Source: Ambulatory Visit

## 2023-09-15 ENCOUNTER — Encounter (HOSPITAL_COMMUNITY)
Admission: RE | Admit: 2023-09-15 | Discharge: 2023-09-15 | Disposition: A | Discharge status: Home / Self Care | Source: Ambulatory Visit | Attending: Internal Medicine | Admitting: Internal Medicine

## 2023-09-15 DIAGNOSIS — Z9889 Other specified postprocedural states: Secondary | ICD-10-CM | POA: Diagnosis not present

## 2023-09-17 ENCOUNTER — Encounter (HOSPITAL_COMMUNITY)
Admission: RE | Admit: 2023-09-17 | Discharge: 2023-09-17 | Disposition: A | Discharge status: Home / Self Care | Source: Ambulatory Visit | Attending: Internal Medicine | Admitting: Internal Medicine

## 2023-09-17 DIAGNOSIS — Z9889 Other specified postprocedural states: Secondary | ICD-10-CM

## 2023-09-19 ENCOUNTER — Encounter (HOSPITAL_COMMUNITY)
Admission: RE | Admit: 2023-09-19 | Discharge: 2023-09-19 | Disposition: A | Discharge status: Home / Self Care | Source: Ambulatory Visit | Attending: Internal Medicine | Admitting: Internal Medicine

## 2023-09-19 DIAGNOSIS — Z9889 Other specified postprocedural states: Secondary | ICD-10-CM

## 2023-09-22 ENCOUNTER — Encounter (HOSPITAL_COMMUNITY)
Admission: RE | Admit: 2023-09-22 | Discharge: 2023-09-22 | Disposition: A | Discharge status: Home / Self Care | Source: Ambulatory Visit | Attending: Internal Medicine | Admitting: Internal Medicine

## 2023-09-22 DIAGNOSIS — Z9889 Other specified postprocedural states: Secondary | ICD-10-CM

## 2023-09-24 ENCOUNTER — Encounter (HOSPITAL_COMMUNITY)
Admission: RE | Admit: 2023-09-24 | Discharge: 2023-09-24 | Disposition: A | Discharge status: Home / Self Care | Source: Ambulatory Visit | Attending: Internal Medicine | Admitting: Internal Medicine

## 2023-09-24 DIAGNOSIS — Z9889 Other specified postprocedural states: Secondary | ICD-10-CM | POA: Diagnosis not present

## 2023-09-24 NOTE — Progress Notes (Signed)
 Cardiac Individual Treatment Plan  Patient Details  Name: [name] [name] MRN: [id number] Date of Birth: 11/01/1945 Referring Provider:   Flowsheet Row INTENSIVE CARDIAC REHAB ORIENT from 08/05/2023 in [facility]  Referring Provider Kyra Phy, MD    Initial Encounter Date:  Flowsheet Row INTENSIVE CARDIAC REHAB ORIENT from 08/05/2023 in [facility], [facility]ascular, [facility]  Date 08/05/23    Visit Diagnosis: 05/27/23 mitral valve repair  05/27/23 tricuspid valve repair  Patient's Home Medications on Admission:  Current Outpatient Medications:    acetaminophen  (TYLENOL ) 325 MG tablet, Take 1-2 tablets (325-650 mg total) by mouth every 4 (four) hours as needed for mild pain (pain score 1-3). (Patient not taking: Reported on 08/12/2023), Disp: , Rfl:    amiodarone  (PACERONE ) 200 MG tablet, Take 1 tablet (200 mg total) by mouth daily., Disp: 90 tablet, Rfl: 0   apixaban  (ELIQUIS ) 5 MG TABS tablet, Take 1 tablet (5 mg total) by mouth 2 (two) times daily., Disp: 180 tablet, Rfl: 0   aspirin  EC 81 MG tablet, Take 1 tablet (81 mg total) by mouth daily. Swallow whole. (Patient not taking: Reported on 08/05/2023), Disp: , Rfl:    atorvastatin  (LIPITOR) 20 MG tablet, Take 1 tablet (20 mg total) by mouth daily., Disp: 30 tablet, Rfl: 0   Fe Fum-Vit C-Vit B12-FA (TRIGELS-F FORTE) CAPS capsule, Take 1 capsule by mouth daily. (Patient not taking: Reported on 08/12/2023), Disp: 30 capsule, Rfl: 0   furosemide  (LASIX ) 20 MG tablet, Take 1 tablet (20 mg total) by mouth daily as needed. (Patient not taking: Reported on 08/12/2023), Disp: , Rfl:    pantoprazole  (PROTONIX ) 40 MG tablet, Take 1 tablet (40 mg total) by mouth 2 (two) times daily. (Patient not taking: Reported on 08/12/2023), Disp: 60 tablet, Rfl: 0   potassium chloride  SA (KLOR-CON  M20) 20 MEQ tablet, Take 1 tablet (20 mEq total) by mouth daily as needed.  (Patient not taking: Reported on 08/12/2023), Disp: , Rfl:    sulfamethoxazole -trimethoprim  (BACTRIM  DS) 800-160 MG tablet, Take 1 tablet by mouth 2 (two) times daily. (Patient not taking: Reported on 08/12/2023), Disp: 14 tablet, Rfl: 0   traMADol  (ULTRAM ) 50 MG tablet, Take 1 tablet (50 mg total) by mouth every 8 (eight) hours as needed., Disp: 120 tablet, Rfl: 0   Vitamin D , Ergocalciferol , (DRISDOL ) 1.25 MG (50000 UNIT) CAPS capsule, Take 1 capsule (50,000 Units total) by mouth every 7 (seven) days. (Patient not taking: Reported on 08/12/2023), Disp: 5 capsule, Rfl: 0  Past Medical History: Past Medical History:  Diagnosis Date   Arthritis    Back   CVA (cerebral infarction) 07/14/2009   TIA   Dysrhythmia    A. Fib   Heart murmur    History of kidney stones    Hyperlipidemia    Hypertension    Nephrolithiasis    Paroxysmal atrial fibrillation (HCC)    Paroxysmal atrial flutter (HCC)    a. s/p ablation 2012.   Prostate cancer (HCC)    Stroke (HCC)    TIA - no deficits    Tobacco Use: Social History   Tobacco Use  Smoking Status Never  Smokeless Tobacco Never    Labs: Review Flowsheet  More data exists      Latest Ref Rng & Units 05/27/2023 05/28/2023 05/30/2023 05/31/2023 06/01/2023  Labs for ITP Cardiac and Pulmonary Rehab  PH, Arterial 7.35 - 7.45 7.314  7.263  7.296  7.375  7.407  7.324  - - - 7.510   PCO2 arterial 32 - 48 mmHg 41.6  38.0  40.9  37.6  38.3  45.6  - - - 41.1   Bicarbonate 20.0 - 28.0 mmol/L 21.3  17.3  20.1  22.0  24.1  23.7  - - - 32.8   TCO2 22 - 32 mmol/L 23  18  21  18  23  25  28  26  25  25  25  24   - - 34   Acid-base deficit 0.0 - 2.0 mmol/L 5.0  9.0  6.0  3.0  3.0  - - - -  O2 Saturation % 90  98  94  99  100  100  - 68.3  63.4  94     Details       Multiple values from one day are sorted in reverse-chronological order         Capillary Blood Glucose: Lab Results  Component Value Date   GLUCAP 73 06/16/2023   GLUCAP 93 06/15/2023    GLUCAP 99 06/15/2023   GLUCAP 126 (H) 06/15/2023   GLUCAP 134 (H) 06/15/2023     Exercise Target Goals: Exercise Program Goal: Individual exercise prescription set using results from initial 6 min walk test and THRR while considering  patient's activity barriers and safety.   Exercise Prescription Goal: Initial exercise prescription builds to 30-45 minutes a day of aerobic activity, 2-3 days per week.  Home exercise guidelines will be given to patient during program as part of exercise prescription that the participant will acknowledge.  Activity Barriers & Risk Stratification:  Activity Barriers & Cardiac Risk Stratification - 08/05/23 1342       Activity Barriers & Cardiac Risk Stratification   Activity Barriers None    Cardiac Risk Stratification Low          6 Minute Walk:  6 Minute Walk     Row Name 08/05/23 1423         6 Minute Walk   Phase Initial     Distance 1282 feet     Walk Time 6 minutes     # of Rest Breaks 0     MPH 2.43     METS 2.34     RPE 9     Perceived Dyspnea  9     VO2 Peak 8.2     Symptoms Yes (comment)     Comments Mild shortness of breath.     Resting HR 69 bpm     Resting BP 122/78     Resting Oxygen  Saturation  98 %     Exercise Oxygen  Saturation  during 6 min walk 97 %     Max Ex. HR 84 bpm     Max Ex. BP 148/82     2 Minute Post BP 142/84        Oxygen  Initial Assessment:   Oxygen  Re-Evaluation:   Oxygen  Discharge (Final Oxygen  Re-Evaluation):   Initial Exercise Prescription:  Initial Exercise Prescription - 08/05/23 1500       Date of Initial Exercise RX and Referring Provider   Date 08/05/23    Referring Provider Thukkani, Arun K, MD    Expected Discharge Date 10/29/23      Recumbant Bike   Level 1    Watts 10    Minutes 15    METs 2.2      NuStep   Level 1    SPM 85  Minutes 15    METs 2.2      Prescription Details   Frequency (times per week) 3    Duration Progress to 30 minutes of continuous  aerobic without signs/symptoms of physical distress      Intensity   THRR 40-80% of Max Heartrate 56-112    Ratings of Perceived Exertion 11-13      Progression   Progression Continue to progress workloads to maintain intensity without signs/symptoms of physical distress.      Resistance Training   Training Prescription Yes    Weight 3 lbs    Reps 10-15          Perform Capillary Blood Glucose checks as needed.  Exercise Prescription Changes:   Exercise Prescription Changes     Row Name 08/11/23 1505 08/27/23 1634 09/15/23 1600         Response to Exercise   Blood Pressure (Admit) 118/70 112/66 122/74     Blood Pressure (Exercise) 136/82 128/82 --     Blood Pressure (Exit) 122/72 112/70 118/72     Heart Rate (Admit) 70 bpm 70 bpm 69 bpm     Heart Rate (Exercise) 77 bpm 75 bpm 91 bpm     Heart Rate (Exit) 70 bpm 70 bpm 71 bpm     Rating of Perceived Exertion (Exercise) 11 11 12      Perceived Dyspnea (Exercise) -- -- 0     Symptoms None None none     Comments Off to a great start with exercise. none none     Duration Continue with 30 min of aerobic exercise without signs/symptoms of physical distress. Continue with 30 min of aerobic exercise without signs/symptoms of physical distress. Continue with 30 min of aerobic exercise without signs/symptoms of physical distress.     Intensity THRR unchanged THRR unchanged THRR unchanged       Progression   Progression Continue to progress workloads to maintain intensity without signs/symptoms of physical distress. Continue to progress workloads to maintain intensity without signs/symptoms of physical distress. Continue to progress workloads to maintain intensity without signs/symptoms of physical distress.     Average METs 2.1 2.5 3.3       Resistance Training   Training Prescription Yes No Yes     Weight 3 lbs 3 lbs 3     Reps 10-15 10-15 10-15     Time 10 Minutes 10 Minutes 5 Minutes       Interval Training   Interval  Training No No No       Recumbant Bike   Level 1 2 4      RPM 42 52 --     Watts 46 68 --     Minutes 15 15 15      METs 2.3 2.9 3.8       Recumbant Elliptical   Level 1 1 4      RPM 68 -- 51     Watts 12 -- 62     Minutes 15 15 15      METs 1.9 2.1  Did not report today: Previous session 2.1 MET's 2.8       Home Exercise Plan   Plans to continue exercise at -- Home (comment) Home (comment)     Frequency -- Add 2 additional days to program exercise sessions. Add 2 additional days to program exercise sessions.     Initial Home Exercises Provided -- 08/27/23 08/27/23        Exercise Comments:   Exercise Comments  Row Name 08/11/23 1605 08/27/23 1641 09/15/23 1637       Exercise Comments Patient tolerated low intensity exercise well without symptoms. Oriented him to the exercise equipment and stretching routine. Reviewed MET's, goals and home ExRx. Pt tolerated exercise well with an average MET level of 2.5. Pt is already feeling an increase in strength nad flexibility. Talked over some exercises he can do at home to help him with tasks he wants to accomplish: being able to pick things up from the floor, tie his shoes more comfortable, get up easier from the floor. He is already doing a flexibility routine, he's also going to walk, golf, try chair fitness and some hand weights for 1-2 days for 30 ish mins (may need to break up into two sessions) Reviewed METs today with pt. Pt is tolerating an avg MET level of 3.3 well with no s/sx. Pt has shown steady increase in workload on both octane and recbike. Discussed with pt increasing rpms on both machines, pt said he would be able to do so but isn't ready to increase workload yet. Will continue to progress as tolerated.        Exercise Goals and Review:   Exercise Goals     Row Name 08/05/23 1342             Exercise Goals   Increase Physical Activity Yes       Intervention Provide advice, education, support and counseling about  physical activity/exercise needs.;Develop an individualized exercise prescription for aerobic and resistive training based on initial evaluation findings, risk stratification, comorbidities and participant's personal goals.       Expected Outcomes Short Term: Attend rehab on a regular basis to increase amount of physical activity.;Long Term: Exercising regularly at least 3-5 days a week.;Long Term: Add in home exercise to make exercise part of routine and to increase amount of physical activity.       Increase Strength and Stamina Yes       Intervention Provide advice, education, support and counseling about physical activity/exercise needs.;Develop an individualized exercise prescription for aerobic and resistive training based on initial evaluation findings, risk stratification, comorbidities and participant's personal goals.       Expected Outcomes Short Term: Increase workloads from initial exercise prescription for resistance, speed, and METs.;Short Term: Perform resistance training exercises routinely during rehab and add in resistance training at home;Long Term: Improve cardiorespiratory fitness, muscular endurance and strength as measured by increased METs and functional capacity ( )       Able to understand and use rate of perceived exertion (RPE) scale Yes       Intervention Provide education and explanation on how to use RPE scale       Expected Outcomes Short Term: Able to use RPE daily in rehab to express subjective intensity level;Long Term:  Able to use RPE to guide intensity level when exercising independently       Knowledge and understanding of Target Heart Rate Range (THRR) Yes       Intervention Provide education and explanation of THRR including how the numbers were predicted and where they are located for reference       Expected Outcomes Short Term: Able to state/look up THRR;Long Term: Able to use THRR to govern intensity when exercising independently;Short Term: Able to use  daily as guideline for intensity in rehab       Able to check pulse independently Yes       Intervention Provide education and demonstration on  how to check pulse in carotid and radial arteries.;Review the importance of being able to check your own pulse for safety during independent exercise       Expected Outcomes Short Term: Able to explain why pulse checking is important during independent exercise;Long Term: Able to check pulse independently and accurately       Understanding of Exercise Prescription Yes       Intervention Provide education, explanation, and written materials on patient's individual exercise prescription       Expected Outcomes Short Term: Able to explain program exercise prescription;Long Term: Able to explain home exercise prescription to exercise independently          Exercise Goals Re-Evaluation :  Exercise Goals Re-Evaluation     Row Name 08/11/23 1605 08/27/23 1637           Exercise Goal Re-Evaluation   Exercise Goals Review Increase Physical Activity;Increase Strength and Stamina;Able to understand and use rate of perceived exertion (RPE) scale Increase Physical Activity;Increase Strength and Stamina;Able to understand and use rate of perceived exertion (RPE) scale      Comments Patient was able to understand and use RPE scale appropriately. Reviewed MET's, goals and home ExRx. Pt tolerated exercise well with an average MET level of 2.5. Pt is already feeling an increase in strength nad flexibility. Talked over some exercises he can do at home to help him with tasks he wants to accomplish: being able to pick things up from the floor, tie his shoes more comfortable, get up easier from the floor. He is already doing a flexibility routine, he's also going to walk, golf, try chair fitness and some hand weights for 1-2 days for 30 ish mins (may need to break up into two sessions)      Expected Outcomes Progress workloads as tolerated to help increase strength and  stamina. Progress workloads as tolerated to help increase strength and stamina.         Discharge Exercise Prescription (Final Exercise Prescription Changes):  Exercise Prescription Changes - 09/15/23 1600       Response to Exercise   Blood Pressure (Admit) 122/74    Blood Pressure (Exit) 118/72    Heart Rate (Admit) 69 bpm    Heart Rate (Exercise) 91 bpm    Heart Rate (Exit) 71 bpm    Rating of Perceived Exertion (Exercise) 12    Perceived Dyspnea (Exercise) 0    Symptoms none    Comments none    Duration Continue with 30 min of aerobic exercise without signs/symptoms of physical distress.    Intensity THRR unchanged      Progression   Progression Continue to progress workloads to maintain intensity without signs/symptoms of physical distress.    Average METs 3.3      Resistance Training   Training Prescription Yes    Weight 3    Reps 10-15    Time 5 Minutes      Interval Training   Interval Training No      Recumbant Bike   Level 4    Minutes 15    METs 3.8      Recumbant Elliptical   Level 4    RPM 51    Watts 62    Minutes 15    METs 2.8      Home Exercise Plan   Plans to continue exercise at Home (comment)    Frequency Add 2 additional days to program exercise sessions.    Initial Home Exercises Provided  08/27/23          Nutrition:  Target Goals: Understanding of nutrition guidelines, daily intake of sodium 1500mg , cholesterol 200mg , calories 30% from fat and 7% or less from saturated fats, daily to have 5 or more servings of fruits and vegetables.  Biometrics:  Pre Biometrics - 08/05/23 1254       Pre Biometrics   Waist Circumference 40.75 inches    Hip Circumference 40.25 inches    Waist to Hip Ratio 1.01 %    Triceps Skinfold 18 mm    % Body Fat 28.7 %    Grip Strength 34 kg    Flexibility 0 in   knees bent   Single Leg Stand 30 seconds           Nutrition Therapy Plan and Nutrition Goals:  Nutrition Therapy & Goals - 09/12/23  0849       Nutrition Therapy   Diet Heart Healthy Diet    Drug/Food Interactions Statins/Certain Fruits      Personal Nutrition Goals   Nutrition Goal Patient to identify strategies for reducing cardiovascular risk by attending the [employer] education and nutrition series weekly.   goal in action.   Personal Goal #2 Patient to improve diet quality by using the plate method as a guide for meal planning to include lean protein/plant protein, fruits, vegetables, whole grains, nonfat dairy as part of a well-balanced diet.   goal in progress.   Personal Goal #3 Patient to reduce sodium to 1500mg  per day.   goal in progress.   Comments [name] [name] has medical history of MV + TV repair, HFrEF, TIA, HTN. Lipids are well controlled. He continues to attend the [employer] education and nutrition series regularly. Losartan  added at 07/25/23 cardiology visit to aid with improved blood pressure control. 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 Hemoglobin has improved WNL. He has maintained his weight since starting with our program. Patient will benefit from participation in intensive cardiac rehab for nutrition, exercise, and lifestyle modification.      Intervention Plan   Intervention Prescribe, educate and counsel regarding individualized specific dietary modifications aiming towards targeted core components such as weight, hypertension, lipid management, diabetes, heart failure and other comorbidities.;Nutrition handout(s) given to patient.    Expected Outcomes Short Term Goal: Understand basic principles of dietary content, such as calories, fat, sodium, cholesterol and nutrients.;Long Term Goal: Adherence to prescribed nutrition plan.          Nutrition Assessments:  Nutrition Assessments - 08/14/23 1527       Rate Your Plate Scores   Pre Score 58         MEDIFICTS Score Key: >=70 Need to make dietary  changes  40-70 Heart Healthy Diet <= 40 Therapeutic Level Cholesterol Diet   Flowsheet Row INTENSIVE CARDIAC REHAB from 08/13/2023 in [facility]  Picture Your Plate Total Score on Admission 58   Picture Your Plate Scores: <04 Unhealthy dietary pattern with much room for improvement. 41-50 Dietary pattern unlikely to meet recommendations for good health and room for improvement. 51-60 More healthful dietary pattern, with some room for improvement.  >60 Healthy dietary pattern, although there may be some specific behaviors that could be improved.    Nutrition Goals Re-Evaluation:  Nutrition Goals Re-Evaluation     Row Name 08/12/23 1137 09/12/23 5409  Goals   Current Weight 191 lb 2.2 oz (86.7 kg) 192 lb 3.9 oz (87.2 kg)      Comment BMP WNL, BNP 1069, CBC improving, A1c 5.8, LDL 65, HDL 28 BMP WNL, BNP 1069, CBC improving, A1c 5.8, LDL 65, HDL 28      Expected Outcome [name] [name] has medical history of MV + TV repair, HFrEF, TIA, HTN. Lipids are well controlled. Losartan  added at 07/25/23 cardiology visit to aid with improved blood pressure control. Patient will benefit from participation in intensive cardiac rehab for nutrition, exercise, and lifestyle modification. [name] [name] has medical history of MV + TV repair, HFrEF, TIA, HTN. Lipids are well controlled. He continues to attend the [employer] education and nutrition series regularly. Losartan  added at 07/25/23 cardiology visit to aid with improved blood pressure control. 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 Hemoglobin has improved WNL. He has maintained his weight since starting with our program. Patient will benefit from participation in intensive cardiac rehab for nutrition, exercise, and lifestyle modification.         Nutrition Goals Re-Evaluation:  Nutrition Goals Re-Evaluation      Row Name 08/12/23 1137 09/12/23 0849           Goals   Current Weight 191 lb 2.2 oz (86.7 kg) 192 lb 3.9 oz (87.2 kg)      Comment BMP WNL, BNP 1069, CBC improving, A1c 5.8, LDL 65, HDL 28 BMP WNL, BNP 1069, CBC improving, A1c 5.8, LDL 65, HDL 28      Expected Outcome [name] [name] has medical history of MV + TV repair, HFrEF, TIA, HTN. Lipids are well controlled. Losartan  added at 07/25/23 cardiology visit to aid with improved blood pressure control. Patient will benefit from participation in intensive cardiac rehab for nutrition, exercise, and lifestyle modification. [name] [name] has medical history of MV + TV repair, HFrEF, TIA, HTN. Lipids are well controlled. He continues to attend the [facility] education and nutrition series regularly. Losartan  added at 07/25/23 cardiology visit to aid with improved blood pressure control. 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 Hemoglobin has improved WNL. He has maintained his weight since starting with our program. Patient will benefit from participation in intensive cardiac rehab for nutrition, exercise, and lifestyle modification.         Nutrition Goals Discharge (Final Nutrition Goals Re-Evaluation):  Nutrition Goals Re-Evaluation - 09/12/23 0849       Goals   Current Weight 192 lb 3.9 oz (87.2 kg)    Comment BMP WNL, BNP 1069, CBC improving, A1c 5.8, LDL 65, HDL 28    Expected Outcome [name] [name] has medical history of MV + TV repair, HFrEF, TIA, HTN. Lipids are well controlled. He continues to attend the [employer] education and nutrition series regularly. Losartan  added at 07/25/23 cardiology visit to aid with improved blood pressure control. 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 Hemoglobin has improved WNL. He has maintained his weight since starting with our program. Patient will benefit from  participation in intensive cardiac rehab for nutrition, exercise, and lifestyle modification.          Psychosocial: Target Goals: Acknowledge presence or absence of significant depression and/or stress, maximize coping skills, provide positive support system. Participant is able to verbalize types and ability to use techniques and  skills needed for reducing stress and depression.  Initial Review & Psychosocial Screening:  Initial Psych Review & Screening - 08/05/23 1344       Initial Review   Current issues with None Identified      Family Dynamics   Good Support System? Yes    Comments [name] [name] has support from his wife.      Barriers   Psychosocial barriers to participate in program There are no identifiable barriers or psychosocial needs.      Screening Interventions   Interventions Encouraged to exercise;Provide feedback about the scores to participant    Expected Outcomes Short Term goal: Identification and review with participant of any Quality of Life or Depression concerns found by scoring the questionnaire.;Long Term goal: The participant improves quality of Life and PHQ9 Scores as seen by post scores and/or verbalization of changes          Quality of Life Scores:  Quality of Life - 08/05/23 1516       Quality of Life   Select Quality of Life      Quality of Life Scores   Health/Function Pre 28 %    Socioeconomic Pre 29.69 %    Psych/Spiritual Pre 29.14 %    Family Pre 24 %    GLOBAL Pre 28.29 %         Scores of 19 and below usually indicate a poorer quality of life in these areas.  A difference of  2-3 points is a clinically meaningful difference.  A difference of 2-3 points in the total score of the Quality of Life Index has been associated with significant improvement in overall quality of life, self-image, physical symptoms, and general health in studies assessing change in quality of life.  PHQ-9: Review Flowsheet  More data exists      08/05/2023  07/09/2023 04/07/2023 10/31/2022 05/20/2022  Depression screen PHQ 2/9  Decreased Interest 0 0 0 0 0 0  Down, Depressed, Hopeless 0 0 0 0 0 0  PHQ - 2 Score 0 0 0 0 0 0  Altered sleeping 1 0 - - -  Tired, decreased energy 1 1 - - -  Change in appetite 0 0 - - -  Feeling bad or failure about yourself  0 0 - - -  Trouble concentrating 0 0 - - -  Moving slowly or fidgety/restless 0 0 - - -  Suicidal thoughts 0 0 - - -  PHQ-9 Score 2 1 - - -    Details       Multiple values from one day are sorted in reverse-chronological order        Interpretation of Total Score  Total Score Depression Severity:  1-4 = Minimal depression, 5-9 = Mild depression, 10-14 = Moderate depression, 15-19 = Moderately severe depression, 20-27 = Severe depression   Psychosocial Evaluation and Intervention:   Psychosocial Re-Evaluation:  Psychosocial Re-Evaluation     Row Name 08/11/23 1629 08/27/23 0748 09/24/23 0744         Psychosocial Re-Evaluation   Current issues with None Identified None Identified None Identified     Comments -- -- [name] [name] says his energy level has improved since he has been participating in cardiac rehab.     Interventions Encouraged to attend Cardiac Rehabilitation for the exercise Encouraged to attend Cardiac Rehabilitation for the exercise Encouraged to attend Cardiac Rehabilitation for the exercise     Continue Psychosocial Services  No Follow up required No Follow up required  No Follow up required        Psychosocial Discharge (Final Psychosocial Re-Evaluation):  Psychosocial Re-Evaluation - 09/24/23 0744       Psychosocial Re-Evaluation   Current issues with None Identified    Comments [name] [name] says his energy level has improved since he has been participating in cardiac rehab.    Interventions Encouraged to attend Cardiac Rehabilitation for the exercise    Continue Psychosocial Services  No Follow up required          Vocational Rehabilitation: Provide vocational rehab  assistance to qualifying candidates.   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         Education: Education Goals: Education classes will be provided on a weekly basis, covering required topics. Participant will state understanding/return demonstration of topics presented.    Education     Row Name 08/11/23 1600     Education   Cardiac Education Topics Pritikin   Nurse, children's Exercise Physiologist   Select Psychosocial   Psychosocial Healthy Minds, Bodies, Hearts   Instruction Review Code 1- Verbalizes Understanding   Class Start Time 1410   Class Stop Time 1445   Class Time Calculation (min) 35 min    Row Name 08/13/23 1500     Education   Cardiac Education Topics Pritikin   Customer service manager   Weekly Topic Adding Flavor - Sodium-Free   Instruction Review Code 1- Verbalizes Understanding   Class Start Time 1400   Class Stop Time 1440   Class Time Calculation (min) 40 min    Row Name 08/22/23 1500     Education   Cardiac Education Topics Pritikin   Customer service manager   Weekly Topic Fast and Healthy Breakfasts   Instruction Review Code 1- Verbalizes Understanding   Class Start Time 1400   Class Stop Time 1440   Class Time Calculation (min) 40 min    Row Name 08/25/23 1400     Education   Cardiac Education Topics Pritikin   Hospital doctor Education   General Education Metabolic Syndrome and Belly Fat   Instruction Review Code 1- Verbalizes Understanding   Class Start Time 1355   Class Stop Time 1440   Class Time Calculation (min)  45 min    Row Name 08/27/23 1400     Education   Cardiac Education Topics Pritikin   Customer service manager   Weekly Topic Personalizing Your Pritikin Plate   Instruction Review Code 1- Verbalizes Understanding   Class Start Time 1355   Class Stop Time 1430   Class Time Calculation (min) 35 min    Row Name 08/29/23 1500     Education   Cardiac Education Topics Pritikin   Nurse, children's Exercise Physiologist   Select Nutrition   Nutrition Overview of the Pritikin Eating Plan   Instruction Review Code 1- Verbalizes Understanding  Class Start Time 1350   Class Stop Time 1435   Class Time Calculation (min) 45 min    Row Name 09/03/23 1500     Education   Cardiac Education Topics Pritikin   Orthoptist   Educator Dietitian   Weekly Topic Delicious Desserts   Instruction Review Code 1- Verbalizes Understanding   Class Start Time 1400   Class Stop Time 1437   Class Time Calculation (min) 37 min    Row Name 09/05/23 1500     Education   Cardiac Education Topics Pritikin   Licensed conveyancer Nutrition   Nutrition Calorie Density   Instruction Review Code 1- Verbalizes Understanding   Class Start Time 1400   Class Stop Time 1440   Class Time Calculation (min) 40 min    Row Name 09/08/23 1600     Education   Cardiac Education Topics Pritikin   Geographical information systems officer Exercise   Exercise Workshop Exercise Basics: Diplomatic Services operational officer   Instruction Review Code 1- Verbalizes Understanding   Class Start Time 1400   Class Stop Time 1450   Class Time Calculation (min) 50 min    Row Name 09/10/23 1400     Education   Cardiac Education Topics Pritikin   Customer service manager   Weekly Topic Efficiency Cooking - Meals  in a Snap   Instruction Review Code 1- Verbalizes Understanding   Class Start Time 1400   Class Stop Time 1442   Class Time Calculation (min) 42 min    Row Name 09/15/23 1400     Education   Cardiac Education Topics Pritikin   Hospital doctor Education   General Education Hypertension and Heart Disease   Instruction Review Code 1- Verbalizes Understanding   Class Start Time 1355   Class Stop Time 1443   Class Time Calculation (min) 48 min    Row Name 09/17/23 1500     Education   Cardiac Education Topics Pritikin   Customer service manager   Weekly Topic One-Pot Wonders   Instruction Review Code 1- Verbalizes Understanding   Class Start Time 1400   Class Stop Time 1445   Class Time Calculation (min) 45 min    Row Name 09/19/23 1500     Education   Cardiac Education Topics Pritikin   Glass blower/designer Nutrition   Nutrition Workshop Targeting Your Nutrition Priorities   Instruction Review Code 1- Verbalizes Understanding   Class Start Time 1400   Class Stop Time 1440   Class Time Calculation (min) 40 min    Row Name 09/22/23 1500     Education   Cardiac Education Topics Pritikin   Geographical information systems officer Psychosocial   Psychosocial Workshop Focused Goals, Sustainable Changes   Instruction Review Code 1- Verbalizes Understanding   Class Start Time 1400   Class Stop Time 1439   Class Time Calculation (min) 39 min      Core Videos: Exercise    Move It!  Clinical staff conducted group or individual video education with verbal and written material and guidebook.  Patient learns the recommended Pritikin exercise program. Exercise with the goal of living a long, healthy life. Some of the health benefits of exercise include controlled diabetes, healthier blood  pressure levels, improved cholesterol levels, improved heart and lung capacity, improved sleep, and better body composition. Everyone should speak with their doctor before starting or changing an exercise routine.  Biomechanical Limitations Clinical staff conducted group or individual video education with verbal and written material and guidebook.  Patient learns how biomechanical limitations can impact exercise and how we can mitigate and possibly overcome limitations to have an impactful and balanced exercise routine.  Body Composition Clinical staff conducted group or individual video education with verbal and written material and guidebook.  Patient learns that body composition (ratio of muscle mass to fat mass) is a key component to assessing overall fitness, rather than body weight alone. Increased fat mass, especially visceral belly fat, can put us  at increased risk for metabolic syndrome, type 2 diabetes, heart disease, and even death. It is recommended to combine diet and exercise (cardiovascular and resistance training) to improve your body composition. Seek guidance from your physician and exercise physiologist before implementing an exercise routine.  Exercise Action Plan Clinical staff conducted group or individual video education with verbal and written material and guidebook.  Patient learns the recommended strategies to achieve and enjoy long-term exercise adherence, including variety, self-motivation, self-efficacy, and positive decision making. Benefits of exercise include fitness, good health, weight management, more energy, better sleep, less stress, and overall well-being.  Medical   Heart Disease Risk Reduction Clinical staff conducted group or individual video education with verbal and written material and guidebook.  Patient learns our heart is our most vital organ as it circulates oxygen , nutrients, white blood cells, and hormones throughout the entire body, and carries  waste away. Data supports a plant-based eating plan like the Pritikin Program for its effectiveness in slowing progression of and reversing heart disease. The video provides a number of recommendations to address heart disease.   Metabolic Syndrome and Belly Fat  Clinical staff conducted group or individual video education with verbal and written material and guidebook.  Patient learns what metabolic syndrome is, how it leads to heart disease, and how one can reverse it and keep it from coming back. You have metabolic syndrome if you have 3 of the following 5 criteria: abdominal obesity, high blood pressure, high triglycerides, low HDL cholesterol, and high blood sugar.  Hypertension and Heart Disease Clinical staff conducted group or individual video education with verbal and written material and guidebook.  Patient learns that high blood pressure, or hypertension, is very common in the [place] . Hypertension is largely due to excessive salt intake, but other important risk factors include being overweight, physical inactivity, drinking too much alcohol, smoking, and not eating enough potassium from fruits and vegetables. High blood pressure is a leading risk factor for heart attack, stroke, congestive heart failure, dementia, kidney failure, and premature death. Long-term effects of excessive salt intake include stiffening of the arteries and thickening of heart muscle and organ damage. Recommendations include ways to reduce hypertension and the risk of heart disease.  Diseases of Our Time - Focusing on Diabetes Clinical staff conducted group or individual video education with verbal and written material and guidebook.  Patient learns why the best way to stop diseases of our time is prevention, through food and other lifestyle changes. Medicine (  such as prescription pills and surgeries) is often only a Band-Aid on the problem, not a long-term solution. Most common diseases of our time include  obesity, type 2 diabetes, hypertension, heart disease, and cancer. The Pritikin Program is recommended and has been proven to help reduce, reverse, and/or prevent the damaging effects of metabolic syndrome.  Nutrition   Overview of the Pritikin Eating Plan  Clinical staff conducted group or individual video education with verbal and written material and guidebook.  Patient learns about the Pritikin Eating Plan for disease risk reduction. The Pritikin Eating Plan emphasizes a wide variety of unrefined, minimally-processed carbohydrates, like fruits, vegetables, whole grains, and legumes. Go, Caution, and Stop food choices are explained. Plant-based and lean animal proteins are emphasized. Rationale provided for low sodium intake for blood pressure control, low added sugars for blood sugar stabilization, and low added fats and oils for coronary artery disease risk reduction and weight management.  Calorie Density  Clinical staff conducted group or individual video education with verbal and written material and guidebook.  Patient learns about calorie density and how it impacts the Pritikin Eating Plan. Knowing the characteristics of the food you choose will help you decide whether those foods will lead to weight gain or weight loss, and whether you want to consume more or less of them. Weight loss is usually a side effect of the Pritikin Eating Plan because of its focus on low calorie-dense foods.  Label Reading  Clinical staff conducted group or individual video education with verbal and written material and guidebook.  Patient learns about the Pritikin recommended label reading guidelines and corresponding recommendations regarding calorie density, added sugars, sodium content, and whole grains.  Dining Out - Part 1  Clinical staff conducted group or individual video education with verbal and written material and guidebook.  Patient learns that restaurant meals can be sabotaging because they can be  so high in calories, fat, sodium, and/or sugar. Patient learns recommended strategies on how to positively address this and avoid unhealthy pitfalls.  Facts on Fats  Clinical staff conducted group or individual video education with verbal and written material and guidebook.  Patient learns that lifestyle modifications can be just as effective, if not more so, as many medications for lowering your risk of heart disease. A Pritikin lifestyle can help to reduce your risk of inflammation and atherosclerosis (cholesterol build-up, or plaque, in the artery walls). Lifestyle interventions such as dietary choices and physical activity address the cause of atherosclerosis. A review of the types of fats and their impact on blood cholesterol levels, along with dietary recommendations to reduce fat intake is also included.  Nutrition Action Plan  Clinical staff conducted group or individual video education with verbal and written material and guidebook.  Patient learns how to incorporate Pritikin recommendations into their lifestyle. Recommendations include planning and keeping personal health goals in mind as an important part of their success.  Healthy Mind-Set    Healthy Minds, Bodies, Hearts  Clinical staff conducted group or individual video education with verbal and written material and guidebook.  Patient learns how to identify when they are stressed. Video will discuss the impact of that stress, as well as the many benefits of stress management. Patient will also be introduced to stress management techniques. The way we think, act, and feel has an impact on our hearts.  How Our Thoughts Can Heal Our Hearts  Clinical staff conducted group or individual video education with verbal and written material and guidebook.  Patient learns that negative thoughts can cause depression and anxiety. This can result in negative lifestyle behavior and serious health problems. Cognitive behavioral therapy is an  effective method to help control our thoughts in order to change and improve our emotional outlook.  Additional Videos:  Exercise    Improving Performance  Clinical staff conducted group or individual video education with verbal and written material and guidebook.  Patient learns to use a non-linear approach by alternating intensity levels and lengths of time spent exercising to help burn more calories and lose more body fat. Cardiovascular exercise helps improve heart health, metabolism, hormonal balance, blood sugar control, and recovery from fatigue. Resistance training improves strength, endurance, balance, coordination, reaction time, metabolism, and muscle mass. Flexibility exercise improves circulation, posture, and balance. Seek guidance from your physician and exercise physiologist before implementing an exercise routine and learn your capabilities and proper form for all exercise.  Introduction to Yoga  Clinical staff conducted group or individual video education with verbal and written material and guidebook.  Patient learns about yoga, a discipline of the coming together of mind, breath, and body. The benefits of yoga include improved flexibility, improved range of motion, better posture and core strength, increased lung function, weight loss, and positive self-image. Yoga's heart health benefits include lowered blood pressure, healthier heart rate, decreased cholesterol and triglyceride levels, improved immune function, and reduced stress. Seek guidance from your physician and exercise physiologist before implementing an exercise routine and learn your capabilities and proper form for all exercise.  Medical   Aging: Enhancing Your Quality of Life  Clinical staff conducted group or individual video education with verbal and written material and guidebook.  Patient learns key strategies and recommendations to stay in good physical health and enhance quality of life, such as prevention  strategies, having an advocate, securing a Health Care Proxy and Power of Attorney, and keeping a list of medications and system for tracking them. It also discusses how to avoid risk for bone loss.  Biology of Weight Control  Clinical staff conducted group or individual video education with verbal and written material and guidebook.  Patient learns that weight gain occurs because we consume more calories than we burn (eating more, moving less). Even if your body weight is normal, you may have higher ratios of fat compared to muscle mass. Too much body fat puts you at increased risk for cardiovascular disease, heart attack, stroke, type 2 diabetes, and obesity-related cancers. In addition to exercise, following the Pritikin Eating Plan can help reduce your risk.  Decoding Lab Results  Clinical staff conducted group or individual video education with verbal and written material and guidebook.  Patient learns that lab test reflects one measurement whose values change over time and are influenced by many factors, including medication, stress, sleep, exercise, food, hydration, pre-existing medical conditions, and more. It is recommended to use the knowledge from this video to become more involved with your lab results and evaluate your numbers to speak with your doctor.   Diseases of Our Time - Overview  Clinical staff conducted group or individual video education with verbal and written material and guidebook.  Patient learns that according to the CDC, 50% to 70% of chronic diseases (such as obesity, type 2 diabetes, elevated lipids, hypertension, and heart disease) are avoidable through lifestyle improvements including healthier food choices, listening to satiety cues, and increased physical activity.  Sleep Disorders Clinical staff conducted group or individual video education with verbal and written material and guidebook.  Patient learns how good quality and duration of sleep are important to  overall health and well-being. Patient also learns about sleep disorders and how they impact health along with recommendations to address them, including discussing with a physician.  Nutrition  Dining Out - Part 2 Clinical staff conducted group or individual video education with verbal and written material and guidebook.  Patient learns how to plan ahead and communicate in order to maximize their dining experience in a healthy and nutritious manner. Included are recommended food choices based on the type of restaurant the patient is visiting.   Fueling a Banker conducted group or individual video education with verbal and written material and guidebook.  There is a strong connection between our food choices and our health. Diseases like obesity and type 2 diabetes are very prevalent and are in large-part due to lifestyle choices. The Pritikin Eating Plan provides plenty of food and hunger-curbing satisfaction. It is easy to follow, affordable, and helps reduce health risks.  Menu Workshop  Clinical staff conducted group or individual video education with verbal and written material and guidebook.  Patient learns that restaurant meals can sabotage health goals because they are often packed with calories, fat, sodium, and sugar. Recommendations include strategies to plan ahead and to communicate with the manager, chef, or server to help order a healthier meal.  Planning Your Eating Strategy  Clinical staff conducted group or individual video education with verbal and written material and guidebook.  Patient learns about the Pritikin Eating Plan and its benefit of reducing the risk of disease. The Pritikin Eating Plan does not focus on calories. Instead, it emphasizes high-quality, nutrient-rich foods. By knowing the characteristics of the foods, we choose, we can determine their calorie density and make informed decisions.  Targeting Your Nutrition Priorities  Clinical staff  conducted group or individual video education with verbal and written material and guidebook.  Patient learns that lifestyle habits have a tremendous impact on disease risk and progression. This video provides eating and physical activity recommendations based on your personal health goals, such as reducing LDL cholesterol, losing weight, preventing or controlling type 2 diabetes, and reducing high blood pressure.  Vitamins and Minerals  Clinical staff conducted group or individual video education with verbal and written material and guidebook.  Patient learns different ways to obtain key vitamins and minerals, including through a recommended healthy diet. It is important to discuss all supplements you take with your doctor.   Healthy Mind-Set    Smoking Cessation  Clinical staff conducted group or individual video education with verbal and written material and guidebook.  Patient learns that cigarette smoking and tobacco addiction pose a serious health risk which affects millions of people. Stopping smoking will significantly reduce the risk of heart disease, lung disease, and many forms of cancer. Recommended strategies for quitting are covered, including working with your doctor to develop a successful plan.  Culinary   Becoming a Set designer conducted group or individual video education with verbal and written material and guidebook.  Patient learns that cooking at home can be healthy, cost-effective, quick, and puts them in control. Keys to cooking healthy recipes will include looking at your recipe, assessing your equipment needs, planning ahead, making it simple, choosing cost-effective seasonal ingredients, and limiting the use of added fats, salts, and sugars.  Cooking - Breakfast and Snacks  Clinical staff conducted group or individual video education with verbal and written material and guidebook.  Patient learns how important breakfast is to satiety and nutrition  through the entire day. Recommendations include key foods to eat during breakfast to help stabilize blood sugar levels and to prevent overeating at meals later in the day. Planning ahead is also a key component.  Cooking - Educational psychologist conducted group or individual video education with verbal and written material and guidebook.  Patient learns eating strategies to improve overall health, including an approach to cook more at home. Recommendations include thinking of animal protein as a side on your plate rather than center stage and focusing instead on lower calorie dense options like vegetables, fruits, whole grains, and plant-based proteins, such as beans. Making sauces in large quantities to freeze for later and leaving the skin on your vegetables are also recommended to maximize your experience.  Cooking - Healthy Salads and Dressing Clinical staff conducted group or individual video education with verbal and written material and guidebook.  Patient learns that vegetables, fruits, whole grains, and legumes are the foundations of the Pritikin Eating Plan. Recommendations include how to incorporate each of these in flavorful and healthy salads, and how to create homemade salad dressings. Proper handling of ingredients is also covered. Cooking - Soups and [employer] - Soups and Desserts Clinical staff conducted group or individual video education with verbal and written material and guidebook.  Patient learns that Pritikin soups and desserts make for easy, nutritious, and delicious snacks and meal components that are low in sodium, fat, sugar, and calorie density, while high in vitamins, minerals, and filling fiber. Recommendations include simple and healthy ideas for soups and desserts.   Overview     The Pritikin Solution Program Overview Clinical staff conducted group or individual video education with verbal and written material and guidebook.  Patient learns that the  results of the Pritikin Program have been documented in more than 100 articles published in peer-reviewed journals, and the benefits include reducing risk factors for (and, in some cases, even reversing) high cholesterol, high blood pressure, type 2 diabetes, obesity, and more! An overview of the three key pillars of the Pritikin Program will be covered: eating well, doing regular exercise, and having a healthy mind-set.  WORKSHOPS  Exercise: Exercise Basics: Building Your Action Plan Clinical staff led group instruction and group discussion with PowerPoint presentation and patient guidebook. To enhance the learning environment the use of posters, models and videos may be added. At the conclusion of this workshop, patients will comprehend the difference between physical activity and exercise, as well as the benefits of incorporating both, into their routine. Patients will understand the FITT (Frequency, Intensity, Time, and Type) principle and how to use it to build an exercise action plan. In addition, safety concerns and other considerations for exercise and cardiac rehab will be addressed by the presenter. The purpose of this lesson is to promote a comprehensive and effective weekly exercise routine in order to improve patients' overall level of fitness.   Managing Heart Disease: Your Path to a Healthier Heart Clinical staff led group instruction and group discussion with PowerPoint presentation and patient guidebook. To enhance the learning environment the use of posters, models and videos may be added.At the conclusion of this workshop, patients will understand the anatomy and physiology of the heart. Additionally, they will understand how Pritikin's three pillars impact the risk factors, the progression, and the management of heart disease.  The purpose of this lesson is to provide a high-level overview of the  heart, heart disease, and how the Pritikin lifestyle positively impacts risk  factors.  Exercise Biomechanics Clinical staff led group instruction and group discussion with PowerPoint presentation and patient guidebook. To enhance the learning environment the use of posters, models and videos may be added. Patients will learn how the structural parts of their bodies function and how these functions impact their daily activities, movement, and exercise. Patients will learn how to promote a neutral spine, learn how to manage pain, and identify ways to improve their physical movement in order to promote healthy living. The purpose of this lesson is to expose patients to common physical limitations that impact physical activity. Participants will learn practical ways to adapt and manage aches and pains, and to minimize their effect on regular exercise. Patients will learn how to maintain good posture while sitting, walking, and lifting.  Balance Training and Fall Prevention  Clinical staff led group instruction and group discussion with PowerPoint presentation and patient guidebook. To enhance the learning environment the use of posters, models and videos may be added. At the conclusion of this workshop, patients will understand the importance of their sensorimotor skills (vision, proprioception, and the vestibular system) in maintaining their ability to balance as they age. Patients will apply a variety of balancing exercises that are appropriate for their current level of function. Patients will understand the common causes for poor balance, possible solutions to these problems, and ways to modify their physical environment in order to minimize their fall risk. The purpose of this lesson is to teach patients about the importance of maintaining balance as they age and ways to minimize their risk of falling.  WORKSHOPS   Nutrition:  Fueling a Ship broker led group instruction and group discussion with PowerPoint presentation and patient guidebook. To enhance  the learning environment the use of posters, models and videos may be added. Patients will review the foundational principles of the Pritikin Eating Plan and understand what constitutes a serving size in each of the food groups. Patients will also learn Pritikin-friendly foods that are better choices when away from home and review make-ahead meal and snack options. Calorie density will be reviewed and applied to three nutrition priorities: weight maintenance, weight loss, and weight gain. The purpose of this lesson is to reinforce (in a group setting) the key concepts around what patients are recommended to eat and how to apply these guidelines when away from home by planning and selecting Pritikin-friendly options. Patients will understand how calorie density may be adjusted for different weight management goals.  Mindful Eating  Clinical staff led group instruction and group discussion with PowerPoint presentation and patient guidebook. To enhance the learning environment the use of posters, models and videos may be added. Patients will briefly review the concepts of the Pritikin Eating Plan and the importance of low-calorie dense foods. The concept of mindful eating will be introduced as well as the importance of paying attention to internal hunger signals. Triggers for non-hunger eating and techniques for dealing with triggers will be explored. The purpose of this lesson is to provide patients with the opportunity to review the basic principles of the Pritikin Eating Plan, discuss the value of eating mindfully and how to measure internal cues of hunger and fullness using the Hunger Scale. Patients will also discuss reasons for non-hunger eating and learn strategies to use for controlling emotional eating.  Targeting Your Nutrition Priorities Clinical staff led group instruction and group discussion with PowerPoint presentation and patient guidebook. To  enhance the learning environment the use of posters,  models and videos may be added. Patients will learn how to determine their genetic susceptibility to disease by reviewing their family history. Patients will gain insight into the importance of diet as part of an overall healthy lifestyle in mitigating the impact of genetics and other environmental insults. The purpose of this lesson is to provide patients with the opportunity to assess their personal nutrition priorities by looking at their family history, their own health history and current risk factors. Patients will also be able to discuss ways of prioritizing and modifying the Pritikin Eating Plan for their highest risk areas  Menu  Clinical staff led group instruction and group discussion with PowerPoint presentation and patient guidebook. To enhance the learning environment the use of posters, models and videos may be added. Using menus brought in from [employer], or printed from [employer], patients will apply the Pritikin dining out guidelines that were presented in the [employer] video. Patients will also be able to practice these guidelines in a variety of provided scenarios. The purpose of this lesson is to provide patients with the opportunity to practice hands-on learning of the Pritikin Dining Out guidelines with actual menus and practice scenarios.  Label Reading Clinical staff led group instruction and group discussion with PowerPoint presentation and patient guidebook. To enhance the learning environment the use of posters, models and videos may be added. Patients will review and discuss the Pritikin label reading guidelines presented in Pritikin's Label Reading Educational series video. Using fool labels brought in from local grocery stores and markets, patients will apply the label reading guidelines and determine if the packaged food meet the Pritikin guidelines. The purpose of this lesson is to provide patients with the opportunity to review, discuss, and  practice hands-on learning of the Pritikin Label Reading guidelines with actual packaged food labels. Cooking School  Pritikin's [employer] are designed to teach patients ways to prepare quick, simple, and affordable recipes at home. The importance of nutrition's role in chronic disease risk reduction is reflected in its emphasis in the overall Pritikin program. By learning how to prepare essential core Pritikin Eating Plan recipes, patients will increase control over what they eat; be able to customize the flavor of foods without the use of added salt, sugar, or fat; and improve the quality of the food they consume. By learning a set of core recipes which are easily assembled, quickly prepared, and affordable, patients are more likely to prepare more healthy foods at home. These workshops focus on convenient breakfasts, simple entres, side dishes, and desserts which can be prepared with minimal effort and are consistent with nutrition recommendations for cardiovascular risk reduction. Cooking [employer] are taught by a Armed forces logistics/support/administrative officer (RD) who has been trained by the [employer]. The chef or RD has a clear understanding of the importance of minimizing - if not completely eliminating - added fat, sugar, and sodium in recipes. Throughout the series of Cooking School Workshop sessions, patients will learn about healthy ingredients and efficient methods of cooking to build confidence in their capability to prepare    Cooking School weekly topics:  Adding Flavor- Sodium-Free  Fast and Healthy Breakfasts  Powerhouse Plant-Based Proteins  Satisfying Salads and Dressings  Simple Sides and Sauces  International Cuisine-Spotlight on the [employer] Zones  Delicious Desserts  Savory Soups  [employer] - Meals in a Snap  Tasty Appetizers and Snacks  Comforting Weekend  Breakfasts  One-Pot Wonders   Fast Evening Meals  Landscape architect Your  Pritikin Plate  WORKSHOPS   Healthy Mindset (Psychosocial):  Focused Goals, Sustainable Changes Clinical staff led group instruction and group discussion with PowerPoint presentation and patient guidebook. To enhance the learning environment the use of posters, models and videos may be added. Patients will be able to apply effective goal setting strategies to establish at least one personal goal, and then take consistent, meaningful action toward that goal. They will learn to identify common barriers to achieving personal goals and develop strategies to overcome them. Patients will also gain an understanding of how our mind-set can impact our ability to achieve goals and the importance of cultivating a positive and growth-oriented mind-set. The purpose of this lesson is to provide patients with a deeper understanding of how to set and achieve personal goals, as well as the tools and strategies needed to overcome common obstacles which may arise along the way.  From Head to Heart: The Power of a Healthy Outlook  Clinical staff led group instruction and group discussion with PowerPoint presentation and patient guidebook. To enhance the learning environment the use of posters, models and videos may be added. Patients will be able to recognize and describe the impact of emotions and mood on physical health. They will discover the importance of self-care and explore self-care practices which may work for them. Patients will also learn how to utilize the 4 C's to cultivate a healthier outlook and better manage stress and challenges. The purpose of this lesson is to demonstrate to patients how a healthy outlook is an essential part of maintaining good health, especially as they continue their cardiac rehab journey.  Healthy Sleep for a Healthy Heart Clinical staff led group instruction and group discussion with PowerPoint presentation and patient guidebook. To enhance the learning environment the use of  posters, models and videos may be added. At the conclusion of this workshop, patients will be able to demonstrate knowledge of the importance of sleep to overall health, well-being, and quality of life. They will understand the symptoms of, and treatments for, common sleep disorders. Patients will also be able to identify daytime and nighttime behaviors which impact sleep, and they will be able to apply these tools to help manage sleep-related challenges. The purpose of this lesson is to provide patients with a general overview of sleep and outline the importance of quality sleep. Patients will learn about a few of the most common sleep disorders. Patients will also be introduced to the concept of "sleep hygiene," and discover ways to self-manage certain sleeping problems through simple daily behavior changes. Finally, the workshop will motivate patients by clarifying the links between quality sleep and their goals of heart-healthy living.   Recognizing and Reducing Stress Clinical staff led group instruction and group discussion with PowerPoint presentation and patient guidebook. To enhance the learning environment the use of posters, models and videos may be added. At the conclusion of this workshop, patients will be able to understand the types of stress reactions, differentiate between acute and chronic stress, and recognize the impact that chronic stress has on their health. They will also be able to apply different coping mechanisms, such as reframing negative self-talk. Patients will have the opportunity to practice a variety of stress management techniques, such as deep abdominal breathing, progressive muscle relaxation, and/or guided imagery.  The purpose of this lesson is to educate patients on the role of stress in their lives and  to provide healthy techniques for coping with it.  Learning Barriers/Preferences:  Learning Barriers/Preferences - 08/05/23 1344       Learning Barriers/Preferences    Learning Barriers None    Learning Preferences Audio;Computer/Internet;Group Instruction;Individual Instruction;Pictoral;Skilled Demonstration;Verbal Instruction;Video;Written Material          Education Topics:  Knowledge Questionnaire Score:  Knowledge Questionnaire Score - 08/05/23 1517       Knowledge Questionnaire Score   Pre Score 21/24          Core Components/Risk Factors/Patient Goals at Admission:  Personal Goals and Risk Factors at Admission - 08/05/23 1346       Core Components/Risk Factors/Patient Goals on Admission   Hypertension Yes    Intervention Provide education on lifestyle modifcations including regular physical activity/exercise, weight management, moderate sodium restriction and increased consumption of fresh fruit, vegetables, and low fat dairy, alcohol moderation, and smoking cessation.;Monitor prescription use compliance.    Expected Outcomes Short Term: Continued assessment and intervention until BP is < 140/51mm HG in hypertensive participants. < 130/40mm HG in hypertensive participants with diabetes, heart failure or chronic kidney disease.;Long Term: Maintenance of blood pressure at goal levels.    Lipids Yes    Intervention Provide education and support for participant on nutrition & aerobic/resistive exercise along with prescribed medications to achieve LDL 70mg , HDL >40mg .    Expected Outcomes Short Term: Participant states understanding of desired cholesterol values and is compliant with medications prescribed. Participant is following exercise prescription and nutrition guidelines.;Long Term: Cholesterol controlled with medications as prescribed, with individualized exercise RX and with personalized nutrition plan. Value goals: LDL < 70mg , HDL > 40 mg.    Personal Goal Other Yes    Personal Goal Improve upper & lower body strength, be able to pick things up from the floor, be able to tie shoes and/ or put socks on comfortably. Decrease intake of red  meat and sweets. Visit more with friends. Slow down on work activities and take time to enjoy life.    Intervention [name]. [name] will participate in Exercise, Nutrition, and Healthy Mind-Set workshops. He will develop an Exercise Action Plan to help increase strength, stamina, and flexibility.    Expected Outcomes [name] [name] will comply with Exercise and Nutrition Action plans to help achieve personal goals as measured by self-report and functional tests.          Core Components/Risk Factors/Patient Goals Review:   Goals and Risk Factor Review     Row Name 08/11/23 1630 08/27/23 0749 09/24/23 0746         Core Components/Risk Factors/Patient Goals Review   Personal Goals Review Weight Management/Obesity;Hypertension;Lipids;Stress Weight Management/Obesity;Hypertension;Lipids;Stress Weight Management/Obesity;Hypertension;Lipids;Stress     Review [name] [name] started cardiac rehab on 08/11/23. [name] [name] did well with exercise. Vital signs were stable. [name] [name] is off to a good start with with exercise. Vital signs have been  stable. [name] [name] returned to exercise after being absent due to atrial fib. [name] [name] is doing well with exercise at cardiac rehab. Vital signs have been  stable. [name] [name] has increased his workloads. A paced     Expected Outcomes [name] [name] will continue to participate in cardiac rehab for exercise, nutrition and lifestyle modofications [name] [name] will continue to participate in cardiac rehab for exercise, nutrition and lifestyle modofications [name] [name] will continue to participate in cardiac rehab for exercise, nutrition and lifestyle modofications        Core Components/Risk Factors/Patient Goals at Discharge (Final Review):   Goals and Risk Factor Review - 09/24/23 4098  Core Components/Risk Factors/Patient Goals Review   Personal Goals Review Weight Management/Obesity;Hypertension;Lipids;Stress    Review [name] [name] is doing well with exercise at cardiac rehab. Vital signs have been  stable. [name] [name] has increased his workloads. A  paced    Expected Outcomes [name] [name] will continue to participate in cardiac rehab for exercise, nutrition and lifestyle modofications          ITP Comments:  ITP Comments     Row Name 08/05/23 1254 08/11/23 1628 08/27/23 0746 09/24/23 0742     ITP Comments Medical Director- Dr. Gaylyn Keas, MD. Introduction to the Pritikin Education / Intensive Cardiac Rehab Program. Reviewed intital orientation folder with patient. 30 Day ITP Review. [name] [name] [name] [name] started cardiac rehab on 08/11/23. [name] [name] did well with exercise. 30 Day ITP Review. [name] [name] [name] [name] returned to exercise at  cardiac rehab on 08/22/23. [name] [name] is off to a good start with exercise. 30 Day ITP Review. [name] [name] has good attendance and participation with exercise at  cardiac rehab       Comments: See ITP comments.[name] Antonio RN BSN

## 2023-09-26 ENCOUNTER — Encounter (HOSPITAL_COMMUNITY)
Admission: RE | Admit: 2023-09-26 | Discharge: 2023-09-26 | Disposition: A | Discharge status: Home / Self Care | Source: Ambulatory Visit | Attending: Internal Medicine | Admitting: Internal Medicine

## 2023-09-26 DIAGNOSIS — Z9889 Other specified postprocedural states: Secondary | ICD-10-CM | POA: Diagnosis not present

## 2023-09-27 NOTE — Progress Notes (Unsigned)
 Patient ID: [name] [name] MRN: [id number] DOB/AGE: [birth year] 78 y.o.  Primary Care Physician:[name], Butler DASEN, MD Primary Cardiologist: [name] Haws, MD Electrophysiologist: Waddell Lusher, MD  FOCUSED CARDIOVASCULAR PROBLEM LIST:   Mitral regurgitation S/p complex MV repair with 32mm Simulus Ring 2/25 Tricuspid regurgitation S/p TV repair with 34mm MC3 Ring 2/25 Diastolic dysfunction G3DD, BAE, s/p MV repair, s/p TV repair, EF 55-60% TTE 2025 Permanent atrial fibrillation S/p surgical MAZE + LAA closure with Atriclip 2/25 On Eliquis  Atrial flutter  S/p ablation 2012 Hyperlipidemia Aortic Atherosclerosis CT Chest 2025 Hypertension Tachy-brady syndrome S/p PPM 2/25 BMI 30 CKD stage II TIA 2011 Thought 2/2 atrial flutter CAD Mild, coronary angiography 2024  HISTORY OF PRESENT ILLNESS:  October 2024: The patient is a 78 y.o. male with the indicated medical history here for recommendations regarding mitral regurgitation.  The patient is usually followed by the EP service.  He was seen recently and was doing well aside from fatigue that he has noticed.  He had an echocardiogram done which demonstrated significant mitral regurgitation thought to be moderate to severe with a posterior wall hugging jet.  Patient also has at least moderate tricuspid regurgitation.  He has biatrial enlargement.    The patient is here to today by himself.  He is taking care of his wife who is recuperating from some health issues.  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  He has noticed increasing fatigue with activities that he could do last year without any issues.  For example he can no longer take a ditch like he could last year without having to stop and rest.  He has noticed no significant shortness of breath per se.  He does not really feel palpitations.  He has had no bleeding issues while on Eliquis .  He really has not noticed any severe peripheral edema and denies any paroxysmal nocturnal  dyspnea.  He has not required any emergency room visits or hospitalizations.  He is somewhat limited from Achilles tendon issue in terms of ambulating at a rapid rate.  He does see a dentist on a regular basis.  He has a few root canals and fillings that need to be performed in the near future.  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  Plan: Refer for TEE.  November 2024: In the interim the patient had a TEE which demonstrated severe mitral regurgitation due to P2 prolapse. He also has moderate tricuspid regurgitation.  His ejection fraction was mildly reduced.  He had a limited echocardiogram today which demonstrated continued severe degenerative mitral regurgitation at least moderate tricuspid regurgitation.  He continues to do well.  He has noted some lightheadedness when he goes from sitting to standing quickly.  He is not feeling shortness of breath or palpitations.  He denies any heart failure symptoms.  He has had no severe bleeding or bruising.  He has not required any emergency room visits or hospitalizations.  He is still caring for his wife who is getting over orthopedic issues and may require another procedure.  Plan: Decrease Cozaar  to 25 mg, stop diltiazem  due to mild LV dysfunction, and start Toprol -XL 50 mg daily; refer for right heart catheterization and coronary angiography study; refer to Dr. Maryjane for surgical opinion.  12/24: The patient returns for follow-up.  He is coronary angiography study demonstrated mild nonobstructive disease.  Right heart catheterization demonstrated V waves to 25 mmHg with a mean wedge pressure of 20 mmHg.  His cardiac output and index were preserved.  His LVEDP was 23 mmHg.  He was started on Lasix  20 mg daily.  He was seen by Dr. Maryjane.  Given his advanced age, mildly reduced ejection fraction, and history of stroke he was thought to be at elevated risk for surgical tricuspid repair, mitral valve repair, and maze procedure.  He is  here to discuss further.  The patient is relatively unchanged.  He is gets somewhat fatigued when he exerts himself more than moderately.  In regards to his wife's health that she is recuperating well and doing much better.  He does not think her recuperation would limit him from recovering from surgery.  He is still on the fence about what he would like to pursue.  He has done a lot of research regarding Maze procedure, left atrial appendage ligation concomitant with mitral valve and tricuspid valve surgery.  He has yet to have his root canals performed as the dentist is booking far out.  Plan:  Patient to consider surgical repair.  June 2025:  Patient consents to use of AI scribe. In the interim the patient did undergo surgery with Dr. Maryjane in February.  His postoperative course was complicated by bradycardia.  Given his history of atrial fibrillation and therefore tachybradycardia syndrome he was referred for pacemaker which was placed postoperatively.  He also underwent TEE cardioversion to normal sinus rhythm.  He did experience afterload mismatch with his echocardiogram demonstrated ejection fraction around 30% however repeat echocardiogram in April demonstrated an EF of 55 to 60% with mild mitral regurgitation, mild tricuspid regurgitation, moderate biatrial enlargement, and mild RV enlargement with grade 3 diastolic dysfunction. Past Medical History:  Diagnosis Date   Arthritis    Back   CVA (cerebral infarction) 07/14/2009   TIA   Dysrhythmia    A. Fib   Heart murmur    History of kidney stones    Hyperlipidemia    Hypertension    Nephrolithiasis    Paroxysmal atrial fibrillation (HCC)    Paroxysmal atrial flutter (HCC)    a. s/p ablation 2012.   Prostate cancer [facility])    Stroke [facility])    TIA - no deficits    Past Surgical History:  Procedure Laterality Date   APPENDECTOMY  1967   BONE BIOPSY  06/14/2023   Procedure: BIOPSY,Colon;  Surgeon: Leigh Elspeth SQUIBB, MD;  Location: [facility]  ENDOSCOPY;  Service: Gastroenterology;;   CARDIOVERSION N/A 06/03/2023   Procedure: CARDIOVERSION;  Surgeon: Lonni Slain, MD;  Location[facility] INVASIVE CV LAB;  Service: Cardiovascular;  Laterality: N/A;   CATARACT EXTRACTION W/ INTRAOCULAR LENS IMPLANT Bilateral    CERVICAL SPINE SURGERY     Cadaver Bone Placed   COLONOSCOPY     COLONOSCOPY N/A 06/14/2023   Procedure: COLONOSCOPY;  Surgeon: Leigh Elspeth SQUIBB, MD;  Location: [facility] ENDOSCOPY;  Service: Gastroenterology;  Laterality: N/A;   CYSTOSCOPY/URETEROSCOPY/HOLMIUM LASER/STENT PLACEMENT Left 09/19/2020   Procedure: CYSTOSCOPY RETROGRADE PYELOGRAM URETEROSCOPY/HOLMIUM LASER/STENT PLACEMENT;  Surgeon: Watt Rush, MD;  Location: [facility] ORS;  Service: Urology;  Laterality: Left;   EYE SURGERY Bilateral    Eye Lid lifting   LUMBAR SPINE SURGERY     Ruptured Disc   MAZE N/A 05/27/2023   Procedure: MAZE;  Surgeon: Maryjane Mt, MD;  Location: [facility] OR;  Service: Open Heart Surgery;  Laterality: N/A;   MITRAL VALVE REPAIR N/A 05/27/2023   Procedure: MITRAL VALVE REPAIR USING MEDTRONIC SIMULUS SEMI-RIDGID ANNULOPLASTY BAND (MVR);  Surgeon: Maryjane Mt, MD;  Location: [facility] OR;  Service: Open Heart Surgery;  Laterality: N/A;   PACEMAKER IMPLANT N/A 06/03/2023   Procedure: PACEMAKER IMPLANT;  Surgeon: Waddell Danelle ORN, MD;  Location: [facility] INVASIVE CV LAB;  Service: Cardiovascular;  Laterality: N/A;   PROSTATECTOMY  2005   RIGHT/LEFT HEART CATH AND CORONARY ANGIOGRAPHY N/A 02/27/2023   Procedure: RIGHT/LEFT HEART CATH AND CORONARY ANGIOGRAPHY;  Surgeon: [name] Lurena POUR, MD;  Location: [facility] INVASIVE CV LAB;  Service: Cardiovascular;  Laterality: N/A;   TEE WITHOUT CARDIOVERSION N/A 02/07/2023   Procedure: TRANSESOPHAGEAL ECHOCARDIOGRAM;  Surgeon: Francyne Headland, MD;  Location: [facility] INVASIVE CV LAB;  Service: Cardiovascular;  Laterality: N/A;   TEE WITHOUT CARDIOVERSION N/A 05/27/2023   Procedure: TRANSESOPHAGEAL ECHOCARDIOGRAM (TEE);  Surgeon: Maryjane Mt, MD;  Location[facility] OR;  Service: Open Heart Surgery;  Laterality: N/A;   TRANSESOPHAGEAL ECHOCARDIOGRAM (CATH LAB) N/A 06/03/2023   Procedure: TRANSESOPHAGEAL ECHOCARDIOGRAM;  Surgeon: Lonni Slain, MD;  Location: [facility] INVASIVE CV LAB;  Service: Cardiovascular;  Laterality: N/A;   TRICUSPID VALVE REPLACEMENT N/A 05/27/2023   Procedure: TRICUSPID VALVE REPAIR USING EDWARDS MC3 TRICUSPID ANNULOPLASTY RING;  Surgeon: Maryjane Mt, MD;  Location: [facility] OR;  Service: Open Heart Surgery;  Laterality: N/A;    Family History  Problem Relation Age of Onset   Heart disease Mother 63       died   Prostate cancer Father        alive   Testicular cancer Brother        died    Social History   Socioeconomic History   Marital status: Married    Spouse name: Not on file   Number of children: 2   Years of education: Not on file   Highest education level: Not on file  Occupational History   Occupation: Personnel officer: [employer]  Tobacco Use   Smoking status: Never   Smokeless tobacco: Never  Vaping Use   Vaping status: Never Used  Substance and Sexual Activity   Alcohol use: No   Drug use: No   Sexual activity: Yes  Other Topics Concern   Not on file  Social History Narrative   Not on file   Social Drivers of Health   Financial Resource Strain: Low Risk  (06/18/2023)   Overall Financial Resource Strain (CARDIA)    Difficulty of Paying Living Expenses: Not very hard  Food Insecurity: No Food Insecurity (05/28/2023)   Hunger Vital Sign    Worried About Running Out of Food in the Last Year: Never true    Ran Out of Food in the Last Year: Never true  Transportation Needs: No Transportation Needs (06/18/2023)   PRAPARE - Administrator, Civil Service (Medical): No    Lack of Transportation (Non-Medical): No  Physical Activity: Insufficiently Active (10/31/2022)   Exercise Vital Sign    Days of Exercise per Week: 3 days    Minutes of Exercise  per Session: 20 min  Stress: No Stress Concern Present (10/31/2022)   [employer] of Occupational Health - Occupational Stress Questionnaire    Feeling of Stress : Not at all  Social Connections: Socially Integrated (05/28/2023)   Social Connection and Isolation Panel    Frequency of Communication with Friends and Family: More than three times a week    Frequency of Social Gatherings with Friends and Family: Three times a week    Attends Religious Services: More than 4 times per year    Active Member  of Clubs or Organizations: Yes    Attends Banker Meetings: 1 to 4 times per year    Marital Status: Married  Catering manager Violence: Not At Risk (05/28/2023)   Humiliation, Afraid, Rape, and Kick questionnaire    Fear of Current or Ex-Partner: No    Emotionally Abused: No    Physically Abused: No    Sexually Abused: No     Prior to Admission medications   Medication Sig Start Date End Date Taking? Authorizing Provider  atorvastatin  (LIPITOR) 20 MG tablet TAKE 1 TABLET(20 MG) BY MOUTH DAILY 05/20/22   Duanne Butler DASEN, MD  diltiazem  (CARDIZEM  CD) 180 MG 24 hr capsule Take 1 capsule (180 mg total) by mouth daily. 01/09/23   Lesia Ozell Barter, PA-C  losartan  (COZAAR ) 50 MG tablet TAKE 1 TABLET(50 MG) BY MOUTH DAILY 07/24/22   Duanne Butler DASEN, MD  rivaroxaban  (XARELTO ) 20 MG TABS tablet Take 1 tablet (20 mg total) by mouth daily with supper. 07/29/22   Waddell Danelle ORN, MD  traMADol  (ULTRAM ) 50 MG tablet TAKE 1 TABLET BY MOUTH EVERY 6 HOURS AS NEEDED. 09/04/22   Duanne Butler DASEN, MD    No Known Allergies  REVIEW OF SYSTEMS:  General: no fevers/chills/night sweats Eyes: no blurry vision, diplopia, or amaurosis ENT: no sore throat or hearing loss Resp: no cough, wheezing, or hemoptysis CV: no edema or palpitations GI: no abdominal pain, nausea, vomiting, diarrhea, or constipation GU: no dysuria, frequency, or hematuria Skin: no rash Neuro: no headache, numbness,  tingling, or weakness of extremities Musculoskeletal: no joint pain or swelling Heme: no bleeding, DVT, or easy bruising Endo: no polydipsia or polyuria  There were no vitals taken for this visit.  PHYSICAL EXAM: GEN:  AO x 3 in no acute distress HEENT: normal Dentition: Normal Neck: JVP normal. +2 carotid upstrokes without bruits. No thyromegaly. Lungs: equal expansion, clear bilaterally CV: Apex is discrete and nondisplaced, regular rate and rhythm with 3 out of 6 holosystolic murmur best heard at the axilla Abd: soft, non-tender, non-distended; no bruit; positive bowel sounds Ext: no edema, ecchymoses, or cyanosis Vascular: 2+ femoral pulses, 2+ radial pulses       Skin: warm and dry without rash Neuro: CN II-XII grossly intact; motor and sensory grossly intact    DATA AND STUDIES:  EKG: April 2025 atrial pacing without left bundle branch block.  EKG Interpretation Date/Time:    Ventricular Rate:    PR Interval:    QRS Duration:    QT Interval:    QTC Calculation:   R Axis:      Text Interpretation:          Cardiac Studies & Procedures   ______________________________________________________________________________________________ CARDIAC CATHETERIZATION  CARDIAC CATHETERIZATION 02/27/2023  Conclusion   Prox LAD to Mid LAD lesion is 30% stenosed.  1.  Mild nonobstructive coronary artery disease. 2.  Fick cardiac output of 6.1 L/min and Fick cardiac index of 2.9 L/min/m with the following hemodynamics: Right atrial pressure mean of 13 mmHg Right ventricular pressure 35/3 with end-diastolic pressure of 13 mmHg Wedge pressure mean of 20 mmHg with V waves to 25 mmHg Pulmonary artery pressure 38/21 with a mean of 29 mmHg PVR of 0.66 Woods units PA pulsatility index of 1.3 3.  LVEDP of 23 mmHg  Recommendation: Cardiothoracic surgical opinion regarding mitral and tricuspid valvular disease.  Start lasix  20mg  daily with BMP in one week.  Findings Coronary  Findings Diagnostic  Dominance: Right  Left Anterior Descending  There is mild diffuse disease throughout the vessel. Prox LAD to Mid LAD lesion is 30% stenosed.  Left Circumflex The vessel exhibits minimal luminal irregularities.  Right Coronary Artery The vessel exhibits minimal luminal irregularities.  Intervention  No interventions have been documented.     ECHOCARDIOGRAM  ECHOCARDIOGRAM COMPLETE 07/23/2023  Narrative ECHOCARDIOGRAM REPORT    Patient Name:   EZEQUIEL MACAULEY Date of Exam: 07/23/2023 Medical Rec #:  [id number]      Height:       70.0 in Accession #:    [id number]     Weight:       192.0 lb Date of Birth:  [birth year]       BSA:          2.051 m Patient Age:    78 years       BP:           132/78 mmHg Patient Gender: M              HR:           70 bpm. Exam Location:  Outpatient  Procedure: 2D Echo, 3D Echo, Cardiac Doppler, Color Doppler and Strain Analysis (Both Spectral and Color Flow Doppler were utilized during procedure).  Indications:     R06.9 DOE; R60.0 Lower extremity edema  History:         Patient has prior history of Echocardiogram examinations, most recent 06/03/2023. 05/27/2023 MV annuloplasty, TV Ring and LAA clip. 06/03/2023 Pacemaker inserted, TIA, Arrythmias:Atrial Fibrillation and Atrial Flutter, Signs/Symptoms:Edema and Dyspnea; Risk Factors:Hypertension and Non-Smoker. Patient denies chest pain. He does get DOE and bilateral leg edema.  Mitral Valve: prosthetic annuloplasty ring valve is present in the mitral position.  Sonographer:     Annabella Cater RVT, RDCS (AE), RDMS Referring Phys:  [phone] JACKEE VEAR RADDLE DICK Diagnosing Phys: Wilbert Bihari MD  IMPRESSIONS   1. Left ventricular ejection fraction, by estimation, is 55 to 60%. Left ventricular ejection fraction by 3D volume is 57 %. The left ventricle has normal function. The left ventricle has no regional wall motion abnormalities. There is mild concentric left ventricular  hypertrophy. Left ventricular diastolic parameters are consistent with Grade III diastolic dysfunction (restrictive). 2. Right ventricular systolic function is normal. The right ventricular size is mildly enlarged. Tricuspid regurgitation signal is inadequate for assessing PA pressure. 3. Left atrial size was moderately dilated. 4. Right atrial size was moderately dilated. 5. The mitral valve is degenerative. Mild mitral valve regurgitation. No evidence of mitral stenosis. The mean mitral valve gradient is 2.0 mmHg. Moderate mitral annular calcification. There is a prosthetic annuloplasty ring present in the mitral position. 6. The tricuspid valve is has been repaired/replaced. The tricuspid valve is status post repair with an annuloplasty ring. 7. The aortic valve is tricuspid. Aortic valve regurgitation is trivial. Aortic valve sclerosis/calcification is present, without any evidence of aortic stenosis. Aortic valve area, by VTI measures 2.30 cm. Aortic valve mean gradient measures 5.0 mmHg. Aortic valve Vmax measures 1.52 m/s. 8. Pulmonic valve regurgitation is moderate. 9. Aortic dilatation noted. Aneurysm of the ascending aorta, measuring 45 mm. There is mild dilatation of the aortic root, measuring 41 mm. 10. The inferior vena cava is dilated in size with >50% respiratory variability, suggesting right atrial pressure of 8 mmHg.  Comparison(s): EF 35%, RVSP 38.9 mmHg, asc aorta 43 mm, moderate PI.  FINDINGS Left Ventricle: Left ventricular ejection fraction, by estimation, is 55 to 60%. Left ventricular ejection fraction by 3D volume  is 57 %. The left ventricle has normal function. The left ventricle has no regional wall motion abnormalities. Strain was performed and the global longitudinal strain is indeterminate. The left ventricular internal cavity size was normal in size. There is mild concentric left ventricular hypertrophy. Left ventricular diastolic parameters are consistent with Grade  III diastolic dysfunction (restrictive).  Right Ventricle: The right ventricular size is mildly enlarged. No increase in right ventricular wall thickness. Right ventricular systolic function is normal. Tricuspid regurgitation signal is inadequate for assessing PA pressure.  Left Atrium: Left atrial size was moderately dilated.  Right Atrium: Right atrial size was moderately dilated.  Pericardium: There is no evidence of pericardial effusion.  Mitral Valve: The mitral valve is degenerative in appearance. There is mild calcification of the mitral valve leaflet(s). Moderate mitral annular calcification. Mild mitral valve regurgitation. There is a prosthetic annuloplasty ring present in the mitral position. No evidence of mitral valve stenosis. MV peak gradient, 5.8 mmHg. The mean mitral valve gradient is 2.0 mmHg.  Tricuspid Valve: The tricuspid valve is has been repaired/replaced. Tricuspid valve regurgitation is trivial. No evidence of tricuspid stenosis. The tricuspid valve is status post repair with an annuloplasty ring.  Aortic Valve: The aortic valve is tricuspid. Aortic valve regurgitation is trivial. Aortic valve sclerosis/calcification is present, without any evidence of aortic stenosis. Aortic valve mean gradient measures 5.0 mmHg. Aortic valve peak gradient measures 9.2 mmHg. Aortic valve area, by VTI measures 2.30 cm.  Pulmonic Valve: The pulmonic valve was normal in structure. Pulmonic valve regurgitation is moderate. No evidence of pulmonic stenosis.  Aorta: Aortic dilatation noted. There is mild dilatation of the aortic root, measuring 41 mm. There is an aneurysm involving the ascending aorta measuring 45 mm.  Venous: The inferior vena cava is dilated in size with greater than 50% respiratory variability, suggesting right atrial pressure of 8 mmHg.  IAS/Shunts: No atrial level shunt detected by color flow Doppler.  Additional Comments: 3D was performed not requiring image post  processing on an independent workstation and was normal. A device lead is visualized.   LEFT VENTRICLE PLAX 2D LVIDd:         5.28 cm         Diastology LVIDs:         3.34 cm         LV e' medial:    5.44 cm/s LV PW:         1.12 cm         LV E/e' medial:  22.4 LV IVS:        1.15 cm         LV e' lateral:   8.49 cm/s LVOT diam:     2.00 cm         LV E/e' lateral: 14.4 LV SV:         61 LV SV Index:   30 LVOT Area:     3.14 cm        3D Volume EF LV 3D EF:    Left ventricul ar ejection fraction by 3D volume is 57 %.  3D Volume EF: 3D EF:        57 % LV EDV:       141 ml LV ESV:       61 ml LV SV:        80 ml  RIGHT VENTRICLE RV Basal diam:  4.14 cm RV Mid diam:    3.33 cm RV S prime:  11.50 cm/s TAPSE (M-mode): 1.2 cm  LEFT ATRIUM              Index        RIGHT ATRIUM           Index LA diam:        4.60 cm  2.24 cm/m   RA Area:     25.20 cm LA Vol (A2C):   121.0 ml 58.98 ml/m  RA Volume:   74.70 ml  36.41 ml/m LA Vol (A4C):   63.1 ml  30.76 ml/m LA Biplane Vol: 92.3 ml  44.99 ml/m AORTIC VALVE                     PULMONIC VALVE AV Area (Vmax):    2.29 cm      PV Vmax:          1.06 m/s AV Area (Vmean):   2.28 cm      PV Peak grad:     4.5 mmHg AV Area (VTI):     2.30 cm      PR End Diast Vel: 4.16 msec AV Vmax:           152.00 cm/s AV Vmean:          101.700 cm/s AV VTI:            0.266 m AV Peak Grad:      9.2 mmHg AV Mean Grad:      5.0 mmHg LVOT Vmax:         111.00 cm/s LVOT Vmean:        73.900 cm/s LVOT VTI:          0.194 m LVOT/AV VTI ratio: 0.73  AORTA Ao Root diam: 4.10 cm Ao Asc diam:  4.50 cm  MITRAL VALVE MV Area (PHT): 5.02 cm     SHUNTS MV Area VTI:   2.00 cm     Systemic VTI:  0.19 m MV Peak grad:  5.8 mmHg     Systemic Diam: 2.00 cm MV Mean grad:  2.0 mmHg MV Vmax:       1.20 m/s MV Vmean:      58.8 cm/s MV Decel Time: 151 msec MV E velocity: 122.00 cm/s MV A velocity: 56.20 cm/s MV E/A ratio:  2.17  Wilbert Bihari MD Electronically signed by Wilbert Bihari MD Signature Date/Time: 07/24/2023/8:36:58 AM    Final (Updated)   TEE  ECHO TEE 06/03/2023  Narrative TRANSESOPHOGEAL ECHO REPORT    Patient Name:   CHUCKY HOMES Date of Exam: 06/03/2023 Medical Rec #:  [id number]      Height:       70.0 in Accession #:    [id number]     Weight:       199.5 lb Date of Birth:  1945/12/06       BSA:          2.085 m Patient Age:    78 years       BP:           95/67 mmHg Patient Gender: M              HR:           101 bpm. Exam Location:  Inpatient  Procedure: 3D Echo, Transesophageal Echo, Cardiac Doppler and Color Doppler (Both Spectral and Color Flow Doppler were utilized during procedure).  Indications:     I48.91* Unspeicified atrial fibrillation  History:  Patient has prior history of Echocardiogram examinations, most recent 05/30/2023. Abnormal ECG, Mitral Valve Disease and Tricuspid valve repair, Arrythmias:Atrial Fibrillation and Bradycardia; Risk Factors:Dyslipidemia and Hypertension.  Mitral Valve: prosthetic annuloplasty ring valve is present in the mitral position.  Sonographer:     Ellouise Mose RDCS Referring Phys:  [id number] BRIDGETTE CHRISTOPHER Diagnosing Phys: Shelda Bruckner MD  PROCEDURE: After discussion of the risks and benefits of a TEE, an informed consent was obtained from the patient. The transesophogeal probe was passed without difficulty through the esophogus of the patient. Imaged were obtained with the patient in a left lateral decubitus position. Sedation performed by different physician. The patient was monitored while under deep sedation. Anesthestetic sedation was provided intravenously by Anesthesiology: 272.55mg  of Propofol , 40mg  of Lidocaine . The patient's vital signs; including heart rate, blood pressure, and oxygen  saturation; remained stable throughout the procedure. The patient developed no complications during the procedure. A successful direct  current cardioversion was performed at 200 joules with 1 attempt.  IMPRESSIONS   1. Left ventricular ejection fraction, by estimation, is 35 to 40%. The left ventricle has moderately decreased function. The left ventricular internal cavity size was mildly to moderately dilated. 2. Right ventricular systolic function is moderately reduced. The right ventricular size is mildly enlarged. There is mildly elevated pulmonary artery systolic pressure. The estimated right ventricular systolic pressure is 38.9 mmHg. 3. S/P LA appendage atriclip. Left atrial size was moderately dilated. No left atrial/left atrial appendage thrombus was detected. 4. Right atrial size was moderately dilated. 5. A small pericardial effusion is present. The pericardial effusion is circumferential. 6. The mitral valve has been repaired/replaced. Trivial mitral valve regurgitation. No evidence of mitral stenosis. There is a prosthetic annuloplasty ring present in the mitral position. Echo findings are consistent with normal structure and function of the mitral valve prosthesis. 7. The tricuspid valve is has been repaired/replaced. The tricuspid valve is status post repair with an annuloplasty ring. Tricuspid valve regurgitation is mild to moderate. 8. The aortic valve is tricuspid. There is mild calcification of the aortic valve. Aortic valve regurgitation is not visualized. Aortic valve sclerosis is present, with no evidence of aortic valve stenosis. 9. Pulmonic valve regurgitation is moderate. 10. Aortic dilatation noted. There is mild dilatation of the ascending aorta, measuring 43 mm. There is mild (Grade II) plaque involving the descending aorta. 11. Cannot exclude a small PFO. Agitated saline contrast bubble study was negative, with no evidence of any interatrial shunt. 12. Mitral, Tricuspid, and LAA and demonstrates MVR/TVR stable, LAA appears clipped.  Conclusion(s)/Recommendation(s): No LA/LAA thrombus identified.  Successful cardioversion performed with restoration of normal sinus rhythm.  FINDINGS Left Ventricle: Left ventricular ejection fraction, by estimation, is 35 to 40%. The left ventricle has moderately decreased function. The left ventricular internal cavity size was mildly to moderately dilated.  Right Ventricle: The right ventricular size is mildly enlarged. No increase in right ventricular wall thickness. Right ventricular systolic function is moderately reduced. There is mildly elevated pulmonary artery systolic pressure. The tricuspid regurgitant velocity is 2.78 m/s, and with an assumed right atrial pressure of 8 mmHg, the estimated right ventricular systolic pressure is 38.9 mmHg.  Left Atrium: S/P LA appendage atriclip. Left atrial size was moderately dilated. No left atrial/left atrial appendage thrombus was detected.  Right Atrium: Right atrial size was moderately dilated.  Pericardium: A small pericardial effusion is present. The pericardial effusion is circumferential.  Mitral Valve: The mitral valve has been repaired/replaced. Trivial mitral valve regurgitation. There is a  prosthetic annuloplasty ring present in the mitral position. Echo findings are consistent with normal structure and function of the mitral valve prosthesis. No evidence of mitral valve stenosis. There is no evidence of mitral valve vegetation.  Tricuspid Valve: The tricuspid valve is has been repaired/replaced. Tricuspid valve regurgitation is mild to moderate. No evidence of tricuspid stenosis. The tricuspid valve is status post repair with an annuloplasty ring. There is no evidence of tricuspid valve vegetation.  Aortic Valve: The aortic valve is tricuspid. There is mild calcification of the aortic valve. Aortic valve regurgitation is not visualized. Aortic valve sclerosis is present, with no evidence of aortic valve stenosis. There is no evidence of aortic valve vegetation.  Pulmonic Valve: The pulmonic valve  was grossly normal. Pulmonic valve regurgitation is moderate.  Aorta: Aortic dilatation noted. There is mild dilatation of the ascending aorta, measuring 43 mm. There is mild (Grade II) plaque involving the descending aorta.  IAS/Shunts: Cannot exclude a small PFO. Agitated saline contrast was given intravenously to evaluate for intracardiac shunting. Agitated saline contrast bubble study was negative, with no evidence of any interatrial shunt.  Additional Comments: Spectral Doppler performed.   AORTA Ao Asc diam: 4.30 cm  TRICUSPID VALVE TR Peak grad:   30.9 mmHg TR Vmax:        278.00 cm/s  Shelda Bruckner MD Electronically signed by Shelda Bruckner MD Signature Date/Time: 06/03/2023/4:20:20 PM    Final  MONITORS  LONG TERM MONITOR (3-14 DAYS) 09/03/2022  Narrative Atrial fib with a slow VR (down to 45/min), controlled VR (ave HR 98/min) and RVR with max HR of 184/min. Rare PVC's with couplets No VT No prolonged pauses  Gregg Taylor,MD  Patch Wear Time:  8 days and 8 hours (2024-05-10T22:29:43-0400 to 2024-05-19T06:38:08-398)  Atrial Fibrillation occurred continuously (100% burden), ranging from 45-184 bpm (avg of 98 bpm). Isolated VEs were rare (<1.0%), VE Couplets were rare (<1.0%), and no VE Triplets were present. Ventricular Trigeminy was present.       ______________________________________________________________________________________________      06/13/2023: B Natriuretic Peptide 167.5 06/19/2023: ALT 18; Magnesium  1.9 07/14/2023: NT-Pro BNP 1,069; TSH 2.030 08/08/2023: BUN 15; Creatinine, Ser 1.02; Potassium 4.4; Sodium 142 08/12/2023: Hemoglobin 13.0; Platelets 251.0        ASSESSMENT AND PLAN:   S/P MVR (mitral valve repair)  S/P TVR (tricuspid valve repair)  Permanent atrial fibrillation (HCC)  Secondary hypercoagulable state (HCC)  Pacemaker  Combined systolic and diastolic cardiac dysfunction  Mild CAD  Hyperlipidemia LDL  goal <70  Primary hypertension  BMI 30.0-30.9,adult  Status post mitral valve repair: Most recent echocardiogram was reassuring with regurgitation. Status post tricuspid valve repair: Echocardiogram reassuring in regards to valve repair. Permanent atrial fibrillation: Continue apixaban  5 mg twice daily, discontinue aspirin  81 mg.  Stop amiodarone  100 mg and start Toprol  XL 25 milligrams daily.*** Secondary hypercoagulable state: Continue Eliquis  5 mg twice daily. Status post permanent pacemaker: Followed by EP.  Last EKG demonstrates a pacing with inherent ventricular conduction without left bundle branch block. Combined systolic and diastolic dysfunction: Start Toprol  25 mg daily, losartan  1 mg daily, and Jardiance 10 mg daily.  Continue Lasix  20 mg daily.*** Mild CAD: Continue Eliquis  5 mg twice daily, discontinue aspirin  81 mg daily, continue atorvastatin  20 mg daily*** Hyperlipidemia: Check lipid panel, LFTs, LP(a) today.*** Hypertension: Start losartan  25 mg daily, Toprol -XL 25 mg daily.*** Elevated BMI: Hemoglobin A1c for the last 4 months consistent with prediabetes; continue diet and exercise modification.   Julieanna Geraci K Josie Mesa,  MD  09/27/2023 8:31 AM    [facility] [address] [place], [place], [place]  [address] Phone: [phone]; Fax: [phone]

## 2023-09-29 ENCOUNTER — Encounter (HOSPITAL_COMMUNITY)
Admission: RE | Admit: 2023-09-29 | Discharge: 2023-09-29 | Disposition: A | Discharge status: Home / Self Care | Source: Ambulatory Visit | Attending: Internal Medicine | Admitting: Internal Medicine

## 2023-09-29 DIAGNOSIS — Z9889 Other specified postprocedural states: Secondary | ICD-10-CM

## 2023-10-01 ENCOUNTER — Encounter (HOSPITAL_COMMUNITY)
Admission: RE | Admit: 2023-10-01 | Discharge: 2023-10-01 | Disposition: A | Discharge status: Home / Self Care | Source: Ambulatory Visit | Attending: Internal Medicine | Admitting: Internal Medicine

## 2023-10-01 DIAGNOSIS — Z9889 Other specified postprocedural states: Secondary | ICD-10-CM

## 2023-10-02 ENCOUNTER — Ambulatory Visit: Payer: Medicare Other | Attending: Internal Medicine | Admitting: Internal Medicine

## 2023-10-02 ENCOUNTER — Encounter: Payer: Self-pay | Admitting: Internal Medicine

## 2023-10-02 ENCOUNTER — Telehealth: Payer: Self-pay

## 2023-10-02 VITALS — BP 126/74 | HR 70 | Ht 70.0 in | Wt 187.0 lb

## 2023-10-02 DIAGNOSIS — D6869 Other thrombophilia: Secondary | ICD-10-CM | POA: Diagnosis not present

## 2023-10-02 DIAGNOSIS — I5189 Other ill-defined heart diseases: Secondary | ICD-10-CM | POA: Diagnosis not present

## 2023-10-02 DIAGNOSIS — Z9889 Other specified postprocedural states: Secondary | ICD-10-CM

## 2023-10-02 DIAGNOSIS — E785 Hyperlipidemia, unspecified: Secondary | ICD-10-CM

## 2023-10-02 DIAGNOSIS — Z950 Presence of cardiac pacemaker: Secondary | ICD-10-CM | POA: Diagnosis not present

## 2023-10-02 DIAGNOSIS — I10 Essential (primary) hypertension: Secondary | ICD-10-CM

## 2023-10-02 DIAGNOSIS — I4821 Permanent atrial fibrillation: Secondary | ICD-10-CM | POA: Diagnosis not present

## 2023-10-02 DIAGNOSIS — I2510 Atherosclerotic heart disease of native coronary artery without angina pectoris: Secondary | ICD-10-CM

## 2023-10-02 DIAGNOSIS — Z6830 Body mass index (BMI) 30.0-30.9, adult: Secondary | ICD-10-CM

## 2023-10-02 MED ORDER — METOPROLOL SUCCINATE ER 25 MG PO TB24: 25.0000 mg | ORAL_TABLET | Freq: Every day | ORAL | 3 refills | Status: AC

## 2023-10-02 NOTE — Telephone Encounter (Signed)
 [facility] Pre-operative Risk Assessment     Request for surgical clearance:     Endoscopy Procedure  What type of surgery is being performed?     Colonoscopy  When is this surgery scheduled?     11/17/23  What type of clearance is required ?   Pharmacy  Are there any medications that need to be held prior to surgery and how long? Eliquis  2 days  Practice name and name of physician performing surgery?      [facility]  What is your office phone and fax number?      Phone- [phone]  Fax- [phone]  Anesthesia type (None, local, MAC, general) ?       MAC   Please route your response to Alethea Blocker, CMA

## 2023-10-02 NOTE — Patient Instructions (Signed)
 Medication Instructions:  Your physician has recommended you make the following change in your medication:  1.) stop amiodarone  2.) stop aspirin  3.) start metoprolol  succinate (Toprol  XL) 25 mg - take one tablet daily  *If you need a refill on your cardiac medications before your next appointment, please call your pharmacy*  Lab Work: Today - go to first floor lab for blood work (lipids, liver, Lp(a)  Testing/Procedures: none  Follow-Up: At [facility], you and your health needs are our priority.  As part of our continuing mission to provide you with exceptional heart care, our providers are all part of one team.  This team includes your primary Cardiologist (physician) and Advanced Practice Providers or APPs (Physician Assistants and Nurse Practitioners) who all work together to provide you with the care you need, when you need it.  Your next appointment:   6 month(s)  Provider:   Arun K Thukkani, MD

## 2023-10-03 ENCOUNTER — Ambulatory Visit: Admitting: Nurse Practitioner

## 2023-10-03 ENCOUNTER — Encounter (HOSPITAL_COMMUNITY)
Admission: RE | Admit: 2023-10-03 | Discharge: 2023-10-03 | Disposition: A | Discharge status: Home / Self Care | Source: Ambulatory Visit | Attending: Internal Medicine

## 2023-10-03 DIAGNOSIS — Z9889 Other specified postprocedural states: Secondary | ICD-10-CM | POA: Diagnosis not present

## 2023-10-03 NOTE — Telephone Encounter (Signed)
 Pharmacy please advise on holding Eliquis  for 2 days prior to colonoscopy scheduled for 11/17/2023. Thank you.

## 2023-10-04 LAB — LIPID PANEL
Chol/HDL Ratio: 4.6 ratio (ref 0.0–5.0)
Cholesterol, Total: 176 mg/dL (ref 100–199)
HDL: 38 mg/dL — ABNORMAL LOW (ref >39)
LDL Chol Calc (NIH): 122 mg/dL — ABNORMAL HIGH (ref 0–99)
Triglycerides: 86 mg/dL (ref 0–149)
VLDL Cholesterol Cal: 16 mg/dL (ref 5–40)

## 2023-10-04 LAB — HEPATIC FUNCTION PANEL
ALT: 46 IU/L — ABNORMAL HIGH (ref 0–44)
AST: 33 IU/L (ref 0–40)
Albumin: 4.3 g/dL (ref 3.8–4.8)
Alkaline Phosphatase: 80 IU/L (ref 44–121)
Bilirubin Total: 0.3 mg/dL (ref 0.0–1.2)
Bilirubin, Direct: 0.09 mg/dL (ref 0.00–0.40)
Total Protein: 6.7 g/dL (ref 6.0–8.5)

## 2023-10-04 LAB — LIPOPROTEIN A (LPA): Lipoprotein (a): 50 nmol/L (ref <75.0)

## 2023-10-06 ENCOUNTER — Encounter (HOSPITAL_COMMUNITY)
Admission: RE | Admit: 2023-10-06 | Discharge: 2023-10-06 | Disposition: A | Discharge status: Home / Self Care | Source: Ambulatory Visit | Attending: Internal Medicine | Admitting: Internal Medicine

## 2023-10-06 ENCOUNTER — Ambulatory Visit: Payer: Self-pay | Admitting: Internal Medicine

## 2023-10-06 DIAGNOSIS — Z9889 Other specified postprocedural states: Secondary | ICD-10-CM | POA: Diagnosis not present

## 2023-10-06 DIAGNOSIS — E785 Hyperlipidemia, unspecified: Secondary | ICD-10-CM

## 2023-10-07 NOTE — Telephone Encounter (Signed)
   Patient Name: [name] [name]  DOB: Jan 09, 1946 MRN: [id number]  Primary Cardiologist: Arun K Thukkani, MD  Clinical pharmacists have reviewed the patient's past medical history, labs, and current medications as part of preoperative protocol coverage. The following recommendations have been made:  Per office protocol, patient can hold Eliquis  for 2 days prior to procedure.   Patient will not need bridging with Lovenox (enoxaparin) around procedure.  I will route this recommendation to the requesting party via Epic fax function and remove from pre-op pool.  Please call with questions.  Wyn Raddle, Jackee Shove, NP 10/07/2023, 7:50 AM

## 2023-10-07 NOTE — Telephone Encounter (Signed)
 Patient with diagnosis of atrial fibrillation on Eliquis  for anticoagulation.    What type of surgery is being performed?     Colonoscopy  When is this surgery scheduled?     11/17/23    CHA2DS2-VASc Score = 7   This indicates a 11.2% annual risk of stroke. The patient's score is based upon: CHF History: 1 HTN History: 1 Diabetes History: 0 Stroke History: 2 Vascular Disease History: 1 Age Score: 2 Gender Score: 0   Per chart stroke was TIA in 2011  CrCl 69 Platelet count 251  Patient has not had an Afib/aflutter ablation within the last 3 months or DCCV within the last 30 days  Per office protocol, patient can hold Eliquis  for 2 days prior to procedure.   Patient will not need bridging with Lovenox (enoxaparin) around procedure.  **This guidance is not considered finalized until pre-operative APP has relayed final recommendations.**

## 2023-10-08 ENCOUNTER — Encounter (HOSPITAL_COMMUNITY)
Admission: RE | Admit: 2023-10-08 | Discharge: 2023-10-08 | Disposition: A | Discharge status: Home / Self Care | Source: Ambulatory Visit | Attending: Internal Medicine | Admitting: Internal Medicine

## 2023-10-08 DIAGNOSIS — Z48812 Encounter for surgical aftercare following surgery on the circulatory system: Secondary | ICD-10-CM | POA: Diagnosis not present

## 2023-10-08 DIAGNOSIS — Z7901 Long term (current) use of anticoagulants: Secondary | ICD-10-CM | POA: Insufficient documentation

## 2023-10-08 DIAGNOSIS — Z9889 Other specified postprocedural states: Secondary | ICD-10-CM | POA: Insufficient documentation

## 2023-10-13 ENCOUNTER — Encounter (HOSPITAL_COMMUNITY)
Admission: RE | Admit: 2023-10-13 | Discharge: 2023-10-13 | Disposition: A | Discharge status: Home / Self Care | Source: Ambulatory Visit | Attending: Internal Medicine

## 2023-10-13 DIAGNOSIS — Z9889 Other specified postprocedural states: Secondary | ICD-10-CM | POA: Diagnosis not present

## 2023-10-13 DIAGNOSIS — Z48812 Encounter for surgical aftercare following surgery on the circulatory system: Secondary | ICD-10-CM | POA: Diagnosis not present

## 2023-10-13 DIAGNOSIS — Z7901 Long term (current) use of anticoagulants: Secondary | ICD-10-CM | POA: Diagnosis not present

## 2023-10-15 ENCOUNTER — Encounter (HOSPITAL_COMMUNITY)
Admission: RE | Admit: 2023-10-15 | Discharge: 2023-10-15 | Disposition: A | Discharge status: Home / Self Care | Source: Ambulatory Visit | Attending: Internal Medicine | Admitting: Internal Medicine

## 2023-10-15 DIAGNOSIS — Z9889 Other specified postprocedural states: Secondary | ICD-10-CM | POA: Diagnosis not present

## 2023-10-15 DIAGNOSIS — Z7901 Long term (current) use of anticoagulants: Secondary | ICD-10-CM | POA: Diagnosis not present

## 2023-10-15 DIAGNOSIS — Z48812 Encounter for surgical aftercare following surgery on the circulatory system: Secondary | ICD-10-CM | POA: Diagnosis not present

## 2023-10-17 ENCOUNTER — Encounter (HOSPITAL_COMMUNITY)
Admission: RE | Admit: 2023-10-17 | Discharge: 2023-10-17 | Disposition: A | Discharge status: Home / Self Care | Source: Ambulatory Visit | Attending: Internal Medicine

## 2023-10-17 DIAGNOSIS — Z7901 Long term (current) use of anticoagulants: Secondary | ICD-10-CM | POA: Diagnosis not present

## 2023-10-17 DIAGNOSIS — Z9889 Other specified postprocedural states: Secondary | ICD-10-CM

## 2023-10-17 DIAGNOSIS — Z48812 Encounter for surgical aftercare following surgery on the circulatory system: Secondary | ICD-10-CM | POA: Diagnosis not present

## 2023-10-17 NOTE — Telephone Encounter (Signed)
 Patient returning call. States he received vm.

## 2023-10-20 ENCOUNTER — Ambulatory Visit: Payer: Medicare Other

## 2023-10-20 ENCOUNTER — Ambulatory Visit: Payer: Self-pay | Admitting: Internal Medicine

## 2023-10-20 ENCOUNTER — Encounter (HOSPITAL_COMMUNITY)
Admission: RE | Admit: 2023-10-20 | Discharge: 2023-10-20 | Disposition: A | Discharge status: Home / Self Care | Source: Ambulatory Visit | Attending: Internal Medicine

## 2023-10-20 DIAGNOSIS — Z48812 Encounter for surgical aftercare following surgery on the circulatory system: Secondary | ICD-10-CM | POA: Diagnosis not present

## 2023-10-20 DIAGNOSIS — Z9889 Other specified postprocedural states: Secondary | ICD-10-CM | POA: Diagnosis not present

## 2023-10-20 DIAGNOSIS — I495 Sick sinus syndrome: Secondary | ICD-10-CM | POA: Diagnosis not present

## 2023-10-20 DIAGNOSIS — Z7901 Long term (current) use of anticoagulants: Secondary | ICD-10-CM | POA: Diagnosis not present

## 2023-10-20 LAB — CUP PACEART REMOTE DEVICE CHECK
Battery Remaining Longevity: 69 mo
Battery Remaining Percentage: 95.5 %
Battery Voltage: 2.99 V
Brady Statistic RA Percent Paced: 66 %
Date Time Interrogation Session: 20250714020013
Implantable Lead Connection Status: 753985
Implantable Lead Implant Date: 20250225
Implantable Lead Location: 753859
Implantable Pulse Generator Implant Date: 20250225
Lead Channel Impedance Value: 380 Ohm
Lead Channel Pacing Threshold Amplitude: 0.75 V
Lead Channel Pacing Threshold Pulse Width: 0.4 ms
Lead Channel Sensing Intrinsic Amplitude: 2.9 mV
Lead Channel Setting Pacing Amplitude: 3.5 V
Pulse Gen Model: 2272
Pulse Gen Serial Number: 5998147

## 2023-10-21 NOTE — Progress Notes (Signed)
 Cardiac Individual Treatment Plan  Patient Details  Name: [name] [name] MRN: [id number] Date of Birth: 20-Jan-1946 Referring Provider:   Flowsheet Row INTENSIVE CARDIAC REHAB ORIENT from 08/05/2023 in [facility]  Referring Provider [name] Lurena POUR, MD    Initial Encounter Date:  Flowsheet Row INTENSIVE CARDIAC REHAB ORIENT from 08/05/2023 in [facility]  Date 08/05/23    Visit Diagnosis: 05/27/23 mitral valve repair  05/27/23 tricuspid valve repair  Patient's Home Medications on Admission:  Current Outpatient Medications:    acetaminophen  (TYLENOL ) 325 MG tablet, Take 1-2 tablets (325-650 mg total) by mouth every 4 (four) hours as needed for mild pain (pain score 1-3)., Disp: , Rfl:    apixaban  (ELIQUIS ) 5 MG TABS tablet, Take 1 tablet (5 mg total) by mouth 2 (two) times daily., Disp: 180 tablet, Rfl: 0   atorvastatin  (LIPITOR) 20 MG tablet, Take 1 tablet (20 mg total) by mouth daily., Disp: 30 tablet, Rfl: 0   Fe Fum-Vit C-Vit B12-FA (TRIGELS-F FORTE) CAPS capsule, Take 1 capsule by mouth daily. (Patient not taking: Reported on 10/02/2023), Disp: 30 capsule, Rfl: 0   furosemide  (LASIX ) 20 MG tablet, Take 1 tablet (20 mg total) by mouth daily as needed., Disp: , Rfl:    metoprolol  succinate (TOPROL  XL) 25 MG 24 hr tablet, Take 1 tablet (25 mg total) by mouth daily., Disp: 90 tablet, Rfl: 3   pantoprazole  (PROTONIX ) 40 MG tablet, Take 1 tablet (40 mg total) by mouth 2 (two) times daily. (Patient not taking: Reported on 10/02/2023), Disp: 60 tablet, Rfl: 0   potassium chloride  SA (KLOR-CON  M20) 20 MEQ tablet, Take 1 tablet (20 mEq total) by mouth daily as needed., Disp: , Rfl:    sulfamethoxazole -trimethoprim  (BACTRIM  DS) 800-160 MG tablet, Take 1 tablet by mouth 2 (two) times daily. (Patient not taking: Reported on 10/02/2023), Disp: 14 tablet, Rfl: 0   traMADol  (ULTRAM ) 50 MG  tablet, Take 1 tablet (50 mg total) by mouth every 8 (eight) hours as needed., Disp: 120 tablet, Rfl: 0   Vitamin D , Ergocalciferol , (DRISDOL ) 1.25 MG (50000 UNIT) CAPS capsule, Take 1 capsule (50,000 Units total) by mouth every 7 (seven) days., Disp: 5 capsule, Rfl: 0  Past Medical History: Past Medical History:  Diagnosis Date   Arthritis    Back   CVA (cerebral infarction) 07/14/2009   TIA   Dysrhythmia    A. Fib   Heart murmur    History of kidney stones    Hyperlipidemia    Hypertension    Nephrolithiasis    Paroxysmal atrial fibrillation (HCC)    Paroxysmal atrial flutter (HCC)    a. s/p ablation 2012.   Prostate cancer (HCC)    Stroke (HCC)    TIA - no deficits    Tobacco Use: Social History   Tobacco Use  Smoking Status Never  Smokeless Tobacco Never    Labs: Review Flowsheet  More data exists      Latest Ref Rng & Units 05/28/2023 05/30/2023 05/31/2023 06/01/2023 10/02/2023  Labs for ITP Cardiac and Pulmonary Rehab  Cholestrol 100 - 199 mg/dL - - - - 823   LDL (calc) 0 - 99 mg/dL - - - - 877   HDL-C >60 mg/dL - - - - 38   Trlycerides 0 - 149 mg/dL - - - - 86   PH, Arterial 7.35 - 7.45 - - - 7.510  -  PCO2 arterial 32 - 48 mmHg - - - 41.1  -  Bicarbonate 20.0 - 28.0 mmol/L - - - 32.8  -  TCO2 22 - 32 mmol/L 24  - - 34  -  O2 Saturation % - 68.3  63.4  94  -    Capillary Blood Glucose: Lab Results  Component Value Date   GLUCAP 73 06/16/2023   GLUCAP 93 06/15/2023   GLUCAP 99 06/15/2023   GLUCAP 126 (H) 06/15/2023   GLUCAP 134 (H) 06/15/2023     Exercise Target Goals: Exercise Program Goal: Individual exercise prescription set using results from initial 6 min walk test and THRR while considering  patient's activity barriers and safety.   Exercise Prescription Goal: Initial exercise prescription builds to 30-45 minutes a day of aerobic activity, 2-3 days per week.  Home exercise guidelines will be given to patient during program as part of exercise  prescription that the participant will acknowledge.  Activity Barriers & Risk Stratification:  Activity Barriers & Cardiac Risk Stratification - 08/05/23 1342       Activity Barriers & Cardiac Risk Stratification   Activity Barriers None    Cardiac Risk Stratification Low          6 Minute Walk:  6 Minute Walk     Row Name 08/05/23 1423         6 Minute Walk   Phase Initial     Distance 1282 feet     Walk Time 6 minutes     # of Rest Breaks 0     MPH 2.43     METS 2.34     RPE 9     Perceived Dyspnea  9     VO2 Peak 8.2     Symptoms Yes (comment)     Comments Mild shortness of breath.     Resting HR 69 bpm     Resting BP 122/78     Resting Oxygen  Saturation  98 %     Exercise Oxygen  Saturation  during 6 min walk 97 %     Max Ex. HR 84 bpm     Max Ex. BP 148/82     2 Minute Post BP 142/84        Oxygen  Initial Assessment:   Oxygen  Re-Evaluation:   Oxygen  Discharge (Final Oxygen  Re-Evaluation):   Initial Exercise Prescription:  Initial Exercise Prescription - 08/05/23 1500       Date of Initial Exercise RX and Referring Provider   Date 08/05/23    Referring Provider Thukkani, Arun K, MD    Expected Discharge Date 10/29/23      Recumbant Bike   Level 1    Watts 10    Minutes 15    METs 2.2      NuStep   Level 1    SPM 85    Minutes 15    METs 2.2      Prescription Details   Frequency (times per week) 3    Duration Progress to 30 minutes of continuous aerobic without signs/symptoms of physical distress      Intensity   THRR 40-80% of Max Heartrate 56-112    Ratings of Perceived Exertion 11-13      Progression   Progression Continue to progress workloads to maintain intensity without signs/symptoms of physical distress.      Resistance Training   Training Prescription Yes    Weight 3 lbs    Reps 10-15  Perform Capillary Blood Glucose checks as needed.  Exercise Prescription Changes:   Exercise Prescription Changes      Row Name 08/11/23 1505 08/27/23 1634 09/15/23 1600 09/26/23 1630 10/17/23 1635     Response to Exercise   Blood Pressure (Admit) 118/70 112/66 122/74 118/70 112/62   Blood Pressure (Exercise) 136/82 128/82 -- -- --   Blood Pressure (Exit) 122/72 112/70 118/72 140/60 110/60   Heart Rate (Admit) 70 bpm 70 bpm 69 bpm 70 bpm 70 bpm   Heart Rate (Exercise) 77 bpm 75 bpm 91 bpm 93 bpm 84 bpm   Heart Rate (Exit) 70 bpm 70 bpm 71 bpm 79 bpm 70 bpm   Rating of Perceived Exertion (Exercise) 11 11 12  11.5 12   Perceived Dyspnea (Exercise) -- -- 0 0 --   Symptoms None None none none --   Comments Off to a great start with exercise. none none none REVD MET   Duration Continue with 30 min of aerobic exercise without signs/symptoms of physical distress. Continue with 30 min of aerobic exercise without signs/symptoms of physical distress. Continue with 30 min of aerobic exercise without signs/symptoms of physical distress. Continue with 30 min of aerobic exercise without signs/symptoms of physical distress. Continue with 30 min of aerobic exercise without signs/symptoms of physical distress.   Intensity THRR unchanged THRR unchanged THRR unchanged THRR unchanged THRR unchanged     Progression   Progression Continue to progress workloads to maintain intensity without signs/symptoms of physical distress. Continue to progress workloads to maintain intensity without signs/symptoms of physical distress. Continue to progress workloads to maintain intensity without signs/symptoms of physical distress. Continue to progress workloads to maintain intensity without signs/symptoms of physical distress. Continue to progress workloads to maintain intensity without signs/symptoms of physical distress.   Average METs 2.1 2.5 3.3 3.55 4.5     Resistance Training   Training Prescription Yes No Yes Yes Yes   Weight 3 lbs 3 lbs 3 3 3    Reps 10-15 10-15 10-15 10-15 10-15   Time 10 Minutes 10 Minutes 5 Minutes 5 Minutes 5  Minutes     Interval Training   Interval Training No No No No No     Recumbant Bike   Level 1 2 4 4 6    RPM 42 52 -- 60 66   Watts 46 68 -- 82 43   Minutes 15 15 15 15 15    METs 2.3 2.9 3.8 3.6 4.9     Recumbant Elliptical   Level 1 1 4 4 6    RPM 68 -- 51 51 50   Watts 12 -- 62 43 86   Minutes 15 15 15 15 15    METs 1.9 2.1  Did not report today: Previous session 2.1 MET's 2.8 3.5 4.1     Home Exercise Plan   Plans to continue exercise at -- Home (comment) Home (comment) Home (comment) Home (comment)   Frequency -- Add 2 additional days to program exercise sessions. Add 2 additional days to program exercise sessions. Add 2 additional days to program exercise sessions. Add 2 additional days to program exercise sessions.   Initial Home Exercises Provided -- 08/27/23 08/27/23 08/27/23 08/27/23      Exercise Comments:   Exercise Comments     Row Name 08/11/23 1605 08/27/23 1641 09/15/23 1637 09/26/23 1630 09/29/23 0828   Exercise Comments Patient tolerated low intensity exercise well without symptoms. Oriented him to the exercise equipment and stretching routine. Reviewed MET's, goals and home ExRx.  Pt tolerated exercise well with an average MET level of 2.5. Pt is already feeling an increase in strength nad flexibility. Talked over some exercises he can do at home to help him with tasks he wants to accomplish: being able to pick things up from the floor, tie his shoes more comfortable, get up easier from the floor. He is already doing a flexibility routine, he's also going to walk, golf, try chair fitness and some hand weights for 1-2 days for 30 ish mins (may need to break up into two sessions) Reviewed METs today with pt. Pt is tolerating an avg MET level of 3.3 well with no s/sx. Pt has shown steady increase in workload on both octane and recbike. Discussed with pt increasing rpms on both machines, pt said he would be able to do so but isn't ready to increase workload yet. Will continue  to progress as tolerated. Reviewed MET's and goals. Pt tolerated exercise well with an average MET level of 3.55. Pt is doing well and progressing MET's. He is feeling stronger and his ADL's are getting easier. He says he definitely feels and improvement --    Row Name 10/17/23 1640           Exercise Comments Reviewed MET's. Pt tolerated exercise well with an average MET level of 4.5. Pt is continueing to progress MET's and WL's and is doing very well with exercise          Exercise Goals and Review:   Exercise Goals     Row Name 08/05/23 1342             Exercise Goals   Increase Physical Activity Yes       Intervention Provide advice, education, support and counseling about physical activity/exercise needs.;Develop an individualized exercise prescription for aerobic and resistive training based on initial evaluation findings, risk stratification, comorbidities and participant's personal goals.       Expected Outcomes Short Term: Attend rehab on a regular basis to increase amount of physical activity.;Long Term: Exercising regularly at least 3-5 days a week.;Long Term: Add in home exercise to make exercise part of routine and to increase amount of physical activity.       Increase Strength and Stamina Yes       Intervention Provide advice, education, support and counseling about physical activity/exercise needs.;Develop an individualized exercise prescription for aerobic and resistive training based on initial evaluation findings, risk stratification, comorbidities and participant's personal goals.       Expected Outcomes Short Term: Increase workloads from initial exercise prescription for resistance, speed, and METs.;Short Term: Perform resistance training exercises routinely during rehab and add in resistance training at home;Long Term: Improve cardiorespiratory fitness, muscular endurance and strength as measured by increased METs and functional capacity ( )       Able to  understand and use rate of perceived exertion (RPE) scale Yes       Intervention Provide education and explanation on how to use RPE scale       Expected Outcomes Short Term: Able to use RPE daily in rehab to express subjective intensity level;Long Term:  Able to use RPE to guide intensity level when exercising independently       Knowledge and understanding of Target Heart Rate Range (THRR) Yes       Intervention Provide education and explanation of THRR including how the numbers were predicted and where they are located for reference       Expected Outcomes Short Term: Able  to state/look up THRR;Long Term: Able to use THRR to govern intensity when exercising independently;Short Term: Able to use daily as guideline for intensity in rehab       Able to check pulse independently Yes       Intervention Provide education and demonstration on how to check pulse in carotid and radial arteries.;Review the importance of being able to check your own pulse for safety during independent exercise       Expected Outcomes Short Term: Able to explain why pulse checking is important during independent exercise;Long Term: Able to check pulse independently and accurately       Understanding of Exercise Prescription Yes       Intervention Provide education, explanation, and written materials on patient's individual exercise prescription       Expected Outcomes Short Term: Able to explain program exercise prescription;Long Term: Able to explain home exercise prescription to exercise independently          Exercise Goals Re-Evaluation :  Exercise Goals Re-Evaluation     Row Name 08/11/23 1605 08/27/23 1637 09/26/23 1630         Exercise Goal Re-Evaluation   Exercise Goals Review Increase Physical Activity;Increase Strength and Stamina;Able to understand and use rate of perceived exertion (RPE) scale Increase Physical Activity;Increase Strength and Stamina;Able to understand and use rate of perceived exertion  (RPE) scale Increase Physical Activity;Increase Strength and Stamina;Able to understand and use rate of perceived exertion (RPE) scale     Comments Patient was able to understand and use RPE scale appropriately. Reviewed MET's, goals and home ExRx. Pt tolerated exercise well with an average MET level of 2.5. Pt is already feeling an increase in strength nad flexibility. Talked over some exercises he can do at home to help him with tasks he wants to accomplish: being able to pick things up from the floor, tie his shoes more comfortable, get up easier from the floor. He is already doing a flexibility routine, he's also going to walk, golf, try chair fitness and some hand weights for 1-2 days for 30 ish mins (may need to break up into two sessions) Reviewed MET's and goals. Pt tolerated exercise well with an average MET level of 3.55. Pt is doing well and progressing MET's. He is feeling stronger and his ADL's are getting easier. He says he definitely feels and improvement     Expected Outcomes Progress workloads as tolerated to help increase strength and stamina. Progress workloads as tolerated to help increase strength and stamina. Progress workloads as tolerated to help increase strength and stamina.        Discharge Exercise Prescription (Final Exercise Prescription Changes):  Exercise Prescription Changes - 10/17/23 1635       Response to Exercise   Blood Pressure (Admit) 112/62    Blood Pressure (Exit) 110/60    Heart Rate (Admit) 70 bpm    Heart Rate (Exercise) 84 bpm    Heart Rate (Exit) 70 bpm    Rating of Perceived Exertion (Exercise) 12    Comments REVD MET    Duration Continue with 30 min of aerobic exercise without signs/symptoms of physical distress.    Intensity THRR unchanged      Progression   Progression Continue to progress workloads to maintain intensity without signs/symptoms of physical distress.    Average METs 4.5      Resistance Training   Training Prescription Yes     Weight 3    Reps 10-15    Time  5 Minutes      Interval Training   Interval Training No      Recumbant Bike   Level 6    RPM 66    Watts 43    Minutes 15    METs 4.9      Recumbant Elliptical   Level 6    RPM 50    Watts 86    Minutes 15    METs 4.1      Home Exercise Plan   Plans to continue exercise at Home (comment)    Frequency Add 2 additional days to program exercise sessions.    Initial Home Exercises Provided 08/27/23          Nutrition:  Target Goals: Understanding of nutrition guidelines, daily intake of sodium 1500mg , cholesterol 200mg , calories 30% from fat and 7% or less from saturated fats, daily to have 5 or more servings of fruits and vegetables.  Biometrics:  Pre Biometrics - 08/05/23 1254       Pre Biometrics   Waist Circumference 40.75 inches    Hip Circumference 40.25 inches    Waist to Hip Ratio 1.01 %    Triceps Skinfold 18 mm    % Body Fat 28.7 %    Grip Strength 34 kg    Flexibility 0 in   knees bent   Single Leg Stand 30 seconds           Nutrition Therapy Plan and Nutrition Goals:  Nutrition Therapy & Goals - 10/13/23 1604       Nutrition Therapy   Diet Heart Healthy Diet    Drug/Food Interactions Statins/Certain Fruits      Personal Nutrition Goals   Nutrition Goal Patient to identify strategies for reducing cardiovascular risk by attending the [employer] education and nutrition series weekly.   goal in action.   Personal Goal #2 Patient to improve diet quality by using the plate method as a guide for meal planning to include lean protein/plant protein, fruits, vegetables, whole grains, nonfat dairy as part of a well-balanced diet.   goal in progress.   Personal Goal #3 Patient to reduce sodium to 1500mg  per day.   goal in progress.   Comments Goals in progresss. Bud has medical history of MV + TV repair, HFrEF, TIA, HTN. LDL is not at goal; atorvastatin  was increased on 10/06/23. He continues to attend the [employer]  education and nutrition series regularly. Losartan  added at 07/25/23 cardiology visit to aid with improved blood pressure control. 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 Hemoglobin has improved WNL. He has maintained his weight since starting with our program. Patient will benefit from participation in intensive cardiac rehab for nutrition, exercise, and lifestyle modification.      Intervention Plan   Intervention Prescribe, educate and counsel regarding individualized specific dietary modifications aiming towards targeted core components such as weight, hypertension, lipid management, diabetes, heart failure and other comorbidities.;Nutrition handout(s) given to patient.    Expected Outcomes Short Term Goal: Understand basic principles of dietary content, such as calories, fat, sodium, cholesterol and nutrients.;Long Term Goal: Adherence to prescribed nutrition plan.          Nutrition Assessments:  Nutrition Assessments - 08/14/23 1527       Rate Your Plate Scores   Pre Score 58         MEDIFICTS Score Key: >=70 Need to make dietary changes  40-70 Heart Healthy  Diet <= 40 Therapeutic Level Cholesterol Diet   Flowsheet Row INTENSIVE CARDIAC REHAB from 08/13/2023 in [facility]  Picture Your Plate Total Score on Admission 58   Picture Your Plate Scores: <59 Unhealthy dietary pattern with much room for improvement. 41-50 Dietary pattern unlikely to meet recommendations for good health and room for improvement. 51-60 More healthful dietary pattern, with some room for improvement.  >60 Healthy dietary pattern, although there may be some specific behaviors that could be improved.    Nutrition Goals Re-Evaluation:  Nutrition Goals Re-Evaluation     Row Name 08/12/23 1137 09/12/23 0849 10/13/23 1604         Goals   Current Weight  191 lb 2.2 oz (86.7 kg) 192 lb 3.9 oz (87.2 kg) 189 lb 9.5 oz (86 kg)     Comment BMP WNL, BNP 1069, CBC improving, A1c 5.8, LDL 65, HDL 28 BMP WNL, BNP 1069, CBC improving, A1c 5.8, LDL 65, HDL 28 ALT 46, Lpa WNL, LDL 122, HDL 38     Expected Outcome Bud has medical history of MV + TV repair, HFrEF, TIA, HTN. Lipids are well controlled. Losartan  added at 07/25/23 cardiology visit to aid with improved blood pressure control. Patient will benefit from participation in intensive cardiac rehab for nutrition, exercise, and lifestyle modification. Bud has medical history of MV + TV repair, HFrEF, TIA, HTN. Lipids are well controlled. He continues to attend the [employer] education and nutrition series regularly. Losartan  added at 07/25/23 cardiology visit to aid with improved blood pressure control. 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 Hemoglobin has improved WNL. He has maintained his weight since starting with our program. Patient will benefit from participation in intensive cardiac rehab for nutrition, exercise, and lifestyle modification. Goals in progresss. Bud has medical history of MV + TV repair, HFrEF, TIA, HTN. LDL is not at goal; atorvastatin  was increased on 10/06/23. He continues to attend the [employer] education and nutrition series regularly. Losartan  added at 07/25/23 cardiology visit to aid with improved blood pressure control. 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 Hemoglobin has improved WNL. He has maintained his weight since starting with our program. Patient will benefit from participation in intensive cardiac rehab for nutrition, exercise, and lifestyle modification.        Nutrition Goals Re-Evaluation:  Nutrition Goals Re-Evaluation     Row Name 08/12/23 1137 09/12/23 0849 10/13/23 1604         Goals    Current Weight 191 lb 2.2 oz (86.7 kg) 192 lb 3.9 oz (87.2 kg) 189 lb 9.5 oz (86 kg)     Comment BMP WNL, BNP 1069, CBC improving, A1c 5.8, LDL 65, HDL 28 BMP WNL, BNP 1069, CBC improving, A1c 5.8, LDL 65, HDL 28 ALT 46, Lpa WNL, LDL 122, HDL 38     Expected Outcome Bud has medical history of MV + TV repair, HFrEF, TIA, HTN. Lipids are well controlled. Losartan  added at 07/25/23 cardiology visit to aid with improved blood pressure control. Patient will benefit from participation in intensive cardiac rehab for nutrition, exercise, and lifestyle modification. Bud has medical history of MV + TV repair, HFrEF, TIA, HTN. Lipids are well controlled. He continues to attend the [employer] education and nutrition series regularly. Losartan  added at 07/25/23 cardiology visit to aid  with improved blood pressure control. 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 Hemoglobin has improved WNL. He has maintained his weight since starting with our program. Patient will benefit from participation in intensive cardiac rehab for nutrition, exercise, and lifestyle modification. Goals in progresss. Bud has medical history of MV + TV repair, HFrEF, TIA, HTN. LDL is not at goal; atorvastatin  was increased on 10/06/23. He continues to attend the [employer] education and nutrition series regularly. Losartan  added at 07/25/23 cardiology visit to aid with improved blood pressure control. 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 Hemoglobin has improved WNL. He has maintained his weight since starting with our program. Patient will benefit from participation in intensive cardiac rehab for nutrition, exercise, and lifestyle modification.        Nutrition Goals Discharge (Final Nutrition Goals Re-Evaluation):  Nutrition Goals Re-Evaluation - 10/13/23 1604        Goals   Current Weight 189 lb 9.5 oz (86 kg)    Comment ALT 46, Lpa WNL, LDL 122, HDL 38    Expected Outcome Goals in progresss. Bud has medical history of MV + TV repair, HFrEF, TIA, HTN. LDL is not at goal; atorvastatin  was increased on 10/06/23. He continues to attend the [employer] education and nutrition series regularly. Losartan  added at 07/25/23 cardiology visit to aid with improved blood pressure control. 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 Hemoglobin has improved WNL. He has maintained his weight since starting with our program. Patient will benefit from participation in intensive cardiac rehab for nutrition, exercise, and lifestyle modification.          Psychosocial: Target Goals: Acknowledge presence or absence of significant depression and/or stress, maximize coping skills, provide positive support system. Participant is able to verbalize types and ability to use techniques and skills needed for reducing stress and depression.  Initial Review & Psychosocial Screening:  Initial Psych Review & Screening - 08/05/23 1344       Initial Review   Current issues with None Identified      Family Dynamics   Good Support System? Yes    Comments Mr. Hudock has support from his wife.      Barriers   Psychosocial barriers to participate in program There are no identifiable barriers or psychosocial needs.      Screening Interventions   Interventions Encouraged to exercise;Provide feedback about the scores to participant    Expected Outcomes Short Term goal: Identification and review with participant of any Quality of Life or Depression concerns found by scoring the questionnaire.;Long Term goal: The participant improves quality of Life and PHQ9 Scores as seen by post scores and/or verbalization of changes          Quality of Life Scores:  Quality of Life - 08/05/23 1516       Quality of  Life   Select Quality of Life      Quality of Life Scores   Health/Function Pre 28 %    Socioeconomic Pre 29.69 %    Psych/Spiritual Pre 29.14 %    Family Pre 24 %    GLOBAL Pre 28.29 %         Scores of 19 and below usually indicate a poorer quality of life in these areas.  A difference of  2-3 points is a clinically meaningful difference.  A difference of 2-3 points in the total score of the Quality of Life Index has been associated with significant improvement in overall quality of life, self-image, physical symptoms, and general health in studies assessing change in quality of life.  PHQ-9: Review Flowsheet  More data exists      08/05/2023 07/09/2023 04/07/2023 10/31/2022 05/20/2022  Depression screen PHQ 2/9  Decreased Interest 0 0 0 0 0 0  Down, Depressed, Hopeless 0 0 0 0 0 0  PHQ - 2 Score 0 0 0 0 0 0  Altered sleeping 1 0 - - -  Tired, decreased energy 1 1 - - -  Change in appetite 0 0 - - -  Feeling bad or failure about yourself  0 0 - - -  Trouble concentrating 0 0 - - -  Moving slowly or fidgety/restless 0 0 - - -  Suicidal thoughts 0 0 - - -  PHQ-9 Score 2 1 - - -    Details       Multiple values from one day are sorted in reverse-chronological order        Interpretation of Total Score  Total Score Depression Severity:  1-4 = Minimal depression, 5-9 = Mild depression, 10-14 = Moderate depression, 15-19 = Moderately severe depression, 20-27 = Severe depression   Psychosocial Evaluation and Intervention:   Psychosocial Re-Evaluation:  Psychosocial Re-Evaluation     Row Name 08/11/23 1629 08/27/23 0748 09/24/23 0744 10/21/23 1530       Psychosocial Re-Evaluation   Current issues with None Identified None Identified None Identified None Identified    Comments -- -- Bud says his energy level has improved since he has been participating in cardiac rehab. Bud says his energy level has improved since he has been participating in cardiac rehab.     Interventions Encouraged to attend Cardiac Rehabilitation for the exercise Encouraged to attend Cardiac Rehabilitation for the exercise Encouraged to attend Cardiac Rehabilitation for the exercise Encouraged to attend Cardiac Rehabilitation for the exercise    Continue Psychosocial Services  No Follow up required No Follow up required No Follow up required No Follow up required       Psychosocial Discharge (Final Psychosocial Re-Evaluation):  Psychosocial Re-Evaluation - 10/21/23 1530       Psychosocial Re-Evaluation   Current issues with None Identified    Comments Bud says his energy level has improved since he has been participating in cardiac rehab.    Interventions Encouraged to attend Cardiac Rehabilitation for the exercise    Continue Psychosocial Services  No Follow up required          Vocational Rehabilitation: Provide vocational rehab assistance to qualifying candidates.   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         Education: Education Goals: Education classes will be provided on a weekly basis, covering required topics. Participant will state understanding/return demonstration of topics presented.    Education     Row Name 08/11/23 1600     Education   Cardiac Education Topics Pritikin   Psychologist, forensic Psychosocial   Psychosocial Healthy Minds, Bodies,  Hearts   Instruction Review Code 1- Verbalizes Understanding   Class Start Time 1410   Class Stop Time 1445   Class Time Calculation (min) 35 min    Row Name 08/13/23 1500     Education   Cardiac Education Topics Pritikin   Customer service manager   Weekly Topic  Adding Flavor - Sodium-Free   Instruction Review Code 1- Verbalizes Understanding   Class Start Time 1400   Class Stop Time 1440   Class Time Calculation (min) 40 min    Row Name 08/22/23 1500     Education   Cardiac Education Topics Pritikin   Customer service manager   Weekly Topic Fast and Healthy Breakfasts   Instruction Review Code 1- Verbalizes Understanding   Class Start Time 1400   Class Stop Time 1440   Class Time Calculation (min) 40 min    Row Name 08/25/23 1400     Education   Cardiac Education Topics Pritikin   Hospital doctor Education   General Education Metabolic Syndrome and Belly Fat   Instruction Review Code 1- Verbalizes Understanding   Class Start Time 1355   Class Stop Time 1440   Class Time Calculation (min) 45 min    Row Name 08/27/23 1400     Education   Cardiac Education Topics Pritikin   Customer service manager   Weekly Topic Personalizing Your Pritikin Plate   Instruction Review Code 1- Verbalizes Understanding   Class Start Time 1355   Class Stop Time 1430   Class Time Calculation (min) 35 min    Row Name 08/29/23 1500     Education   Cardiac Education Topics Pritikin   Nurse, children's Exercise Physiologist   Select Nutrition   Nutrition Overview of the Pritikin Eating Plan   Instruction Review Code 1- Verbalizes Understanding   Class Start Time 1350   Class Stop Time 1435   Class Time Calculation (min) 45 min    Row Name 09/03/23 1500     Education   Cardiac Education Topics Pritikin   Orthoptist   Educator Dietitian   Weekly Topic Delicious Desserts   Instruction Review Code 1- Verbalizes Understanding   Class Start Time 1400   Class Stop Time 1437   Class Time Calculation (min) 37 min    Row Name 09/05/23  1500     Education   Cardiac Education Topics Pritikin   Licensed conveyancer Nutrition   Nutrition Calorie Density   Instruction Review Code 1- Verbalizes Understanding   Class Start Time 1400   Class Stop Time 1440   Class Time Calculation (min) 40 min    Row Name 09/08/23 1600     Education   Cardiac Education Topics Pritikin   [employer]     Workshops   Educator Exercise Physiologist   Select Exercise   Exercise Workshop Exercise Basics: Diplomatic Services operational officer   Instruction Review Code 1- Verbalizes Understanding   Class Start Time 1400   Class Stop Time 1450   Class Time Calculation (min) 50 min  Row Name 09/10/23 1400     Education   Cardiac Education Topics Pritikin   Orthoptist   Educator Dietitian   Weekly Topic Efficiency Cooking - Meals in a Snap   Instruction Review Code 1- Verbalizes Understanding   Class Start Time 1400   Class Stop Time 1442   Class Time Calculation (min) 42 min    Row Name 09/15/23 1400     Education   Cardiac Education Topics Pritikin   Hospital doctor Education   General Education Hypertension and Heart Disease   Instruction Review Code 1- Verbalizes Understanding   Class Start Time 1355   Class Stop Time 1443   Class Time Calculation (min) 48 min    Row Name 09/17/23 1500     Education   Cardiac Education Topics Pritikin   Customer service manager   Weekly Topic One-Pot Wonders   Instruction Review Code 1- Verbalizes Understanding   Class Start Time 1400   Class Stop Time 1445   Class Time Calculation (min) 45 min    Row Name 09/19/23 1500     Education   Cardiac Education Topics Pritikin   Glass blower/designer Nutrition   Nutrition Workshop Targeting Your Nutrition  Priorities   Instruction Review Code 1- Verbalizes Understanding   Class Start Time 1400   Class Stop Time 1440   Class Time Calculation (min) 40 min    Row Name 09/22/23 1500     Education   Cardiac Education Topics Pritikin   Geographical information systems officer Psychosocial   Psychosocial Workshop Focused Goals, Sustainable Changes   Instruction Review Code 1- Verbalizes Understanding   Class Start Time 1400   Class Stop Time 1439   Class Time Calculation (min) 39 min    Row Name 09/24/23 1400     Education   Cardiac Education Topics Pritikin   Customer service manager   Weekly Topic Comforting Weekend Breakfasts   Instruction Review Code 1- Verbalizes Understanding   Class Start Time 1355   Class Stop Time 1430   Class Time Calculation (min) 35 min    Row Name 09/26/23 1400     Education   Cardiac Education Topics Pritikin   Nurse, children's Exercise Physiologist   Select Nutrition   Nutrition Dining Out - Part 1   Instruction Review Code 1- Verbalizes Understanding   Class Start Time 1400   Class Stop Time 1440   Class Time Calculation (min) 40 min    Row Name 09/29/23 1400     Education   Cardiac Education Topics Pritikin   Nurse, children's   Educator Exercise Physiologist   Select Exercise Education   Exercise Education Biomechanial Limitations   Instruction Review Code 1- Verbalizes Understanding   Class Start Time 1415   Class Stop Time 1450   Class Time Calculation (min) 35 min    Row Name 10/01/23 1500     Education   Cardiac Education Topics Pritikin   Multimedia programmer [employer]  Educator Dietitian   Weekly Topic Fast Evening Meals   Instruction Review Code 1- Verbalizes Understanding   Class Start Time 1400   Class Stop Time 1440   Class Time Calculation (min) 40 min    Row Name 10/03/23 1400      Education   Cardiac Education Topics Pritikin   Licensed conveyancer Nutrition   Nutrition Vitamins and Minerals   Instruction Review Code 1- Verbalizes Understanding   Class Start Time 1400   Class Stop Time 1440   Class Time Calculation (min) 40 min    Row Name 10/06/23 1400     Education   Cardiac Education Topics Pritikin   Glass blower/designer Nutrition   Nutrition Workshop Fueling a Forensic psychologist   Instruction Review Code 1- Tax inspector   Class Start Time 1400   Class Stop Time 1445   Class Time Calculation (min) 45 min    Row Name 10/08/23 1400     Education   Cardiac Education Topics Pritikin   Customer service manager   Weekly Topic International Cuisine- Spotlight on the [employer] Zones   Instruction Review Code 1- Verbalizes Understanding   Class Start Time 1355   Class Stop Time 1435   Class Time Calculation (min) 40 min    Row Name 10/13/23 1600     Education   Cardiac Education Topics Pritikin   Geographical information systems officer Psychosocial   Psychosocial Workshop Healthy Sleep for a Healthy Heart   Instruction Review Code 1- Verbalizes Understanding   Class Start Time 1400   Class Stop Time 1443   Class Time Calculation (min) 43 min    Row Name 10/15/23 1500     Education   Cardiac Education Topics Pritikin   Customer service manager   Weekly Topic Simple Sides and Sauces   Instruction Review Code 1- Verbalizes Understanding   Class Start Time 1400   Class Stop Time 1445   Class Time Calculation (min) 45 min    Row Name 10/17/23 1400     Education   Cardiac Education Topics Pritikin   Nurse, children's Exercise Physiologist   Select Psychosocial   Psychosocial How Our Thoughts Can Heal  Our Hearts   Instruction Review Code 1- Verbalizes Understanding   Class Start Time 1400   Class Stop Time 1440   Class Time Calculation (min) 40 min    Row Name 10/20/23 1400     Education   Cardiac Education Topics Pritikin   Hospital doctor Education   General Education Hypertension and Heart Disease   Instruction Review Code 1- Verbalizes Understanding   Class Start Time 1403   Class Stop Time 1445   Class Time Calculation (min) 42 min      Core Videos: Exercise    Move It!  Clinical staff conducted group or individual video education with verbal and written material and guidebook.  Patient learns the recommended Pritikin exercise program. Exercise with the goal of living a long, healthy life. Some of the health benefits of  exercise include controlled diabetes, healthier blood pressure levels, improved cholesterol levels, improved heart and lung capacity, improved sleep, and better body composition. Everyone should speak with their doctor before starting or changing an exercise routine.  Biomechanical Limitations Clinical staff conducted group or individual video education with verbal and written material and guidebook.  Patient learns how biomechanical limitations can impact exercise and how we can mitigate and possibly overcome limitations to have an impactful and balanced exercise routine.  Body Composition Clinical staff conducted group or individual video education with verbal and written material and guidebook.  Patient learns that body composition (ratio of muscle mass to fat mass) is a key component to assessing overall fitness, rather than body weight alone. Increased fat mass, especially visceral belly fat, can put us  at increased risk for metabolic syndrome, type 2 diabetes, heart disease, and even death. It is recommended to combine diet and exercise (cardiovascular and resistance training) to  improve your body composition. Seek guidance from your physician and exercise physiologist before implementing an exercise routine.  Exercise Action Plan Clinical staff conducted group or individual video education with verbal and written material and guidebook.  Patient learns the recommended strategies to achieve and enjoy long-term exercise adherence, including variety, self-motivation, self-efficacy, and positive decision making. Benefits of exercise include fitness, good health, weight management, more energy, better sleep, less stress, and overall well-being.  Medical   Heart Disease Risk Reduction Clinical staff conducted group or individual video education with verbal and written material and guidebook.  Patient learns our heart is our most vital organ as it circulates oxygen , nutrients, white blood cells, and hormones throughout the entire body, and carries waste away. Data supports a plant-based eating plan like the Pritikin Program for its effectiveness in slowing progression of and reversing heart disease. The video provides a number of recommendations to address heart disease.   Metabolic Syndrome and Belly Fat  Clinical staff conducted group or individual video education with verbal and written material and guidebook.  Patient learns what metabolic syndrome is, how it leads to heart disease, and how one can reverse it and keep it from coming back. You have metabolic syndrome if you have 3 of the following 5 criteria: abdominal obesity, high blood pressure, high triglycerides, low HDL cholesterol, and high blood sugar.  Hypertension and Heart Disease Clinical staff conducted group or individual video education with verbal and written material and guidebook.  Patient learns that high blood pressure, or hypertension, is very common in the [place] . Hypertension is largely due to excessive salt intake, but other important risk factors include being overweight, physical inactivity,  drinking too much alcohol, smoking, and not eating enough potassium from fruits and vegetables. High blood pressure is a leading risk factor for heart attack, stroke, congestive heart failure, dementia, kidney failure, and premature death. Long-term effects of excessive salt intake include stiffening of the arteries and thickening of heart muscle and organ damage. Recommendations include ways to reduce hypertension and the risk of heart disease.  Diseases of Our Time - Focusing on Diabetes Clinical staff conducted group or individual video education with verbal and written material and guidebook.  Patient learns why the best way to stop diseases of our time is prevention, through food and other lifestyle changes. Medicine (such as prescription pills and surgeries) is often only a Band-Aid on the problem, not a long-term solution. Most common diseases of our time include obesity, type 2 diabetes, hypertension, heart disease, and cancer. The Pritikin Program is recommended  and has been proven to help reduce, reverse, and/or prevent the damaging effects of metabolic syndrome.  Nutrition   Overview of the Pritikin Eating Plan  Clinical staff conducted group or individual video education with verbal and written material and guidebook.  Patient learns about the Pritikin Eating Plan for disease risk reduction. The Pritikin Eating Plan emphasizes a wide variety of unrefined, minimally-processed carbohydrates, like fruits, vegetables, whole grains, and legumes. Go, Caution, and Stop food choices are explained. Plant-based and lean animal proteins are emphasized. Rationale provided for low sodium intake for blood pressure control, low added sugars for blood sugar stabilization, and low added fats and oils for coronary artery disease risk reduction and weight management.  Calorie Density  Clinical staff conducted group or individual video education with verbal and written material and guidebook.  Patient learns  about calorie density and how it impacts the Pritikin Eating Plan. Knowing the characteristics of the food you choose will help you decide whether those foods will lead to weight gain or weight loss, and whether you want to consume more or less of them. Weight loss is usually a side effect of the Pritikin Eating Plan because of its focus on low calorie-dense foods.  Label Reading  Clinical staff conducted group or individual video education with verbal and written material and guidebook.  Patient learns about the Pritikin recommended label reading guidelines and corresponding recommendations regarding calorie density, added sugars, sodium content, and whole grains.  Dining Out - Part 1  Clinical staff conducted group or individual video education with verbal and written material and guidebook.  Patient learns that restaurant meals can be sabotaging because they can be so high in calories, fat, sodium, and/or sugar. Patient learns recommended strategies on how to positively address this and avoid unhealthy pitfalls.  Facts on Fats  Clinical staff conducted group or individual video education with verbal and written material and guidebook.  Patient learns that lifestyle modifications can be just as effective, if not more so, as many medications for lowering your risk of heart disease. A Pritikin lifestyle can help to reduce your risk of inflammation and atherosclerosis (cholesterol build-up, or plaque, in the artery walls). Lifestyle interventions such as dietary choices and physical activity address the cause of atherosclerosis. A review of the types of fats and their impact on blood cholesterol levels, along with dietary recommendations to reduce fat intake is also included.  Nutrition Action Plan  Clinical staff conducted group or individual video education with verbal and written material and guidebook.  Patient learns how to incorporate Pritikin recommendations into their lifestyle.  Recommendations include planning and keeping personal health goals in mind as an important part of their success.  Healthy Mind-Set    Healthy Minds, Bodies, Hearts  Clinical staff conducted group or individual video education with verbal and written material and guidebook.  Patient learns how to identify when they are stressed. Video will discuss the impact of that stress, as well as the many benefits of stress management. Patient will also be introduced to stress management techniques. The way we think, act, and feel has an impact on our hearts.  How Our Thoughts Can Heal Our Hearts  Clinical staff conducted group or individual video education with verbal and written material and guidebook.  Patient learns that negative thoughts can cause depression and anxiety. This can result in negative lifestyle behavior and serious health problems. Cognitive behavioral therapy is an effective method to help control our thoughts in order to change and improve  our emotional outlook.  Additional Videos:  Exercise    Improving Performance  Clinical staff conducted group or individual video education with verbal and written material and guidebook.  Patient learns to use a non-linear approach by alternating intensity levels and lengths of time spent exercising to help burn more calories and lose more body fat. Cardiovascular exercise helps improve heart health, metabolism, hormonal balance, blood sugar control, and recovery from fatigue. Resistance training improves strength, endurance, balance, coordination, reaction time, metabolism, and muscle mass. Flexibility exercise improves circulation, posture, and balance. Seek guidance from your physician and exercise physiologist before implementing an exercise routine and learn your capabilities and proper form for all exercise.  Introduction to Yoga  Clinical staff conducted group or individual video education with verbal and written material and guidebook.   Patient learns about yoga, a discipline of the coming together of mind, breath, and body. The benefits of yoga include improved flexibility, improved range of motion, better posture and core strength, increased lung function, weight loss, and positive self-image. Yoga's heart health benefits include lowered blood pressure, healthier heart rate, decreased cholesterol and triglyceride levels, improved immune function, and reduced stress. Seek guidance from your physician and exercise physiologist before implementing an exercise routine and learn your capabilities and proper form for all exercise.  Medical   Aging: Enhancing Your Quality of Life  Clinical staff conducted group or individual video education with verbal and written material and guidebook.  Patient learns key strategies and recommendations to stay in good physical health and enhance quality of life, such as prevention strategies, having an advocate, securing a Health Care Proxy and Power of Attorney, and keeping a list of medications and system for tracking them. It also discusses how to avoid risk for bone loss.  Biology of Weight Control  Clinical staff conducted group or individual video education with verbal and written material and guidebook.  Patient learns that weight gain occurs because we consume more calories than we burn (eating more, moving less). Even if your body weight is normal, you may have higher ratios of fat compared to muscle mass. Too much body fat puts you at increased risk for cardiovascular disease, heart attack, stroke, type 2 diabetes, and obesity-related cancers. In addition to exercise, following the Pritikin Eating Plan can help reduce your risk.  Decoding Lab Results  Clinical staff conducted group or individual video education with verbal and written material and guidebook.  Patient learns that lab test reflects one measurement whose values change over time and are influenced by many factors, including  medication, stress, sleep, exercise, food, hydration, pre-existing medical conditions, and more. It is recommended to use the knowledge from this video to become more involved with your lab results and evaluate your numbers to speak with your doctor.   Diseases of Our Time - Overview  Clinical staff conducted group or individual video education with verbal and written material and guidebook.  Patient learns that according to the CDC, 50% to 70% of chronic diseases (such as obesity, type 2 diabetes, elevated lipids, hypertension, and heart disease) are avoidable through lifestyle improvements including healthier food choices, listening to satiety cues, and increased physical activity.  Sleep Disorders Clinical staff conducted group or individual video education with verbal and written material and guidebook.  Patient learns how good quality and duration of sleep are important to overall health and well-being. Patient also learns about sleep disorders and how they impact health along with recommendations to address them, including discussing with a physician.  Nutrition  Dining Out - Part 2 Clinical staff conducted group or individual video education with verbal and written material and guidebook.  Patient learns how to plan ahead and communicate in order to maximize their dining experience in a healthy and nutritious manner. Included are recommended food choices based on the type of restaurant the patient is visiting.   Fueling a Banker conducted group or individual video education with verbal and written material and guidebook.  There is a strong connection between our food choices and our health. Diseases like obesity and type 2 diabetes are very prevalent and are in large-part due to lifestyle choices. The Pritikin Eating Plan provides plenty of food and hunger-curbing satisfaction. It is easy to follow, affordable, and helps reduce health risks.  Menu Workshop  Clinical  staff conducted group or individual video education with verbal and written material and guidebook.  Patient learns that restaurant meals can sabotage health goals because they are often packed with calories, fat, sodium, and sugar. Recommendations include strategies to plan ahead and to communicate with the manager, chef, or server to help order a healthier meal.  Planning Your Eating Strategy  Clinical staff conducted group or individual video education with verbal and written material and guidebook.  Patient learns about the Pritikin Eating Plan and its benefit of reducing the risk of disease. The Pritikin Eating Plan does not focus on calories. Instead, it emphasizes high-quality, nutrient-rich foods. By knowing the characteristics of the foods, we choose, we can determine their calorie density and make informed decisions.  Targeting Your Nutrition Priorities  Clinical staff conducted group or individual video education with verbal and written material and guidebook.  Patient learns that lifestyle habits have a tremendous impact on disease risk and progression. This video provides eating and physical activity recommendations based on your personal health goals, such as reducing LDL cholesterol, losing weight, preventing or controlling type 2 diabetes, and reducing high blood pressure.  Vitamins and Minerals  Clinical staff conducted group or individual video education with verbal and written material and guidebook.  Patient learns different ways to obtain key vitamins and minerals, including through a recommended healthy diet. It is important to discuss all supplements you take with your doctor.   Healthy Mind-Set    Smoking Cessation  Clinical staff conducted group or individual video education with verbal and written material and guidebook.  Patient learns that cigarette smoking and tobacco addiction pose a serious health risk which affects millions of people. Stopping smoking will  significantly reduce the risk of heart disease, lung disease, and many forms of cancer. Recommended strategies for quitting are covered, including working with your doctor to develop a successful plan.  Culinary   Becoming a Set designer conducted group or individual video education with verbal and written material and guidebook.  Patient learns that cooking at home can be healthy, cost-effective, quick, and puts them in control. Keys to cooking healthy recipes will include looking at your recipe, assessing your equipment needs, planning ahead, making it simple, choosing cost-effective seasonal ingredients, and limiting the use of added fats, salts, and sugars.  Cooking - Breakfast and Snacks  Clinical staff conducted group or individual video education with verbal and written material and guidebook.  Patient learns how important breakfast is to satiety and nutrition through the entire day. Recommendations include key foods to eat during breakfast to help stabilize blood sugar levels and to prevent overeating at meals later in the day. Planning  ahead is also a key component.  Cooking - Educational psychologist conducted group or individual video education with verbal and written material and guidebook.  Patient learns eating strategies to improve overall health, including an approach to cook more at home. Recommendations include thinking of animal protein as a side on your plate rather than center stage and focusing instead on lower calorie dense options like vegetables, fruits, whole grains, and plant-based proteins, such as beans. Making sauces in large quantities to freeze for later and leaving the skin on your vegetables are also recommended to maximize your experience.  Cooking - Healthy Salads and Dressing Clinical staff conducted group or individual video education with verbal and written material and guidebook.  Patient learns that vegetables, fruits, whole grains,  and legumes are the foundations of the Pritikin Eating Plan. Recommendations include how to incorporate each of these in flavorful and healthy salads, and how to create homemade salad dressings. Proper handling of ingredients is also covered. Cooking - Soups and [employer] - Soups and Desserts Clinical staff conducted group or individual video education with verbal and written material and guidebook.  Patient learns that Pritikin soups and desserts make for easy, nutritious, and delicious snacks and meal components that are low in sodium, fat, sugar, and calorie density, while high in vitamins, minerals, and filling fiber. Recommendations include simple and healthy ideas for soups and desserts.   Overview     The Pritikin Solution Program Overview Clinical staff conducted group or individual video education with verbal and written material and guidebook.  Patient learns that the results of the Pritikin Program have been documented in more than 100 articles published in peer-reviewed journals, and the benefits include reducing risk factors for (and, in some cases, even reversing) high cholesterol, high blood pressure, type 2 diabetes, obesity, and more! An overview of the three key pillars of the Pritikin Program will be covered: eating well, doing regular exercise, and having a healthy mind-set.  WORKSHOPS  Exercise: Exercise Basics: Building Your Action Plan Clinical staff led group instruction and group discussion with PowerPoint presentation and patient guidebook. To enhance the learning environment the use of posters, models and videos may be added. At the conclusion of this workshop, patients will comprehend the difference between physical activity and exercise, as well as the benefits of incorporating both, into their routine. Patients will understand the FITT (Frequency, Intensity, Time, and Type) principle and how to use it to build an exercise action plan. In addition, safety  concerns and other considerations for exercise and cardiac rehab will be addressed by the presenter. The purpose of this lesson is to promote a comprehensive and effective weekly exercise routine in order to improve patients' overall level of fitness.   Managing Heart Disease: Your Path to a Healthier Heart Clinical staff led group instruction and group discussion with PowerPoint presentation and patient guidebook. To enhance the learning environment the use of posters, models and videos may be added.At the conclusion of this workshop, patients will understand the anatomy and physiology of the heart. Additionally, they will understand how Pritikin's three pillars impact the risk factors, the progression, and the management of heart disease.  The purpose of this lesson is to provide a high-level overview of the heart, heart disease, and how the Pritikin lifestyle positively impacts risk factors.  Exercise Biomechanics Clinical staff led group instruction and group discussion with PowerPoint presentation and patient guidebook. To enhance the learning environment the use of posters, models  and videos may be added. Patients will learn how the structural parts of their bodies function and how these functions impact their daily activities, movement, and exercise. Patients will learn how to promote a neutral spine, learn how to manage pain, and identify ways to improve their physical movement in order to promote healthy living. The purpose of this lesson is to expose patients to common physical limitations that impact physical activity. Participants will learn practical ways to adapt and manage aches and pains, and to minimize their effect on regular exercise. Patients will learn how to maintain good posture while sitting, walking, and lifting.  Balance Training and Fall Prevention  Clinical staff led group instruction and group discussion with PowerPoint presentation and patient guidebook. To  enhance the learning environment the use of posters, models and videos may be added. At the conclusion of this workshop, patients will understand the importance of their sensorimotor skills (vision, proprioception, and the vestibular system) in maintaining their ability to balance as they age. Patients will apply a variety of balancing exercises that are appropriate for their current level of function. Patients will understand the common causes for poor balance, possible solutions to these problems, and ways to modify their physical environment in order to minimize their fall risk. The purpose of this lesson is to teach patients about the importance of maintaining balance as they age and ways to minimize their risk of falling.  WORKSHOPS   Nutrition:  Fueling a Ship broker led group instruction and group discussion with PowerPoint presentation and patient guidebook. To enhance the learning environment the use of posters, models and videos may be added. Patients will review the foundational principles of the Pritikin Eating Plan and understand what constitutes a serving size in each of the food groups. Patients will also learn Pritikin-friendly foods that are better choices when away from home and review make-ahead meal and snack options. Calorie density will be reviewed and applied to three nutrition priorities: weight maintenance, weight loss, and weight gain. The purpose of this lesson is to reinforce (in a group setting) the key concepts around what patients are recommended to eat and how to apply these guidelines when away from home by planning and selecting Pritikin-friendly options. Patients will understand how calorie density may be adjusted for different weight management goals.  Mindful Eating  Clinical staff led group instruction and group discussion with PowerPoint presentation and patient guidebook. To enhance the learning environment the use of posters, models and videos may  be added. Patients will briefly review the concepts of the Pritikin Eating Plan and the importance of low-calorie dense foods. The concept of mindful eating will be introduced as well as the importance of paying attention to internal hunger signals. Triggers for non-hunger eating and techniques for dealing with triggers will be explored. The purpose of this lesson is to provide patients with the opportunity to review the basic principles of the Pritikin Eating Plan, discuss the value of eating mindfully and how to measure internal cues of hunger and fullness using the Hunger Scale. Patients will also discuss reasons for non-hunger eating and learn strategies to use for controlling emotional eating.  Targeting Your Nutrition Priorities Clinical staff led group instruction and group discussion with PowerPoint presentation and patient guidebook. To enhance the learning environment the use of posters, models and videos may be added. Patients will learn how to determine their genetic susceptibility to disease by reviewing their family history. Patients will gain insight into the importance of diet  as part of an overall healthy lifestyle in mitigating the impact of genetics and other environmental insults. The purpose of this lesson is to provide patients with the opportunity to assess their personal nutrition priorities by looking at their family history, their own health history and current risk factors. Patients will also be able to discuss ways of prioritizing and modifying the Pritikin Eating Plan for their highest risk areas  Menu  Clinical staff led group instruction and group discussion with PowerPoint presentation and patient guidebook. To enhance the learning environment the use of posters, models and videos may be added. Using menus brought in from [employer], or printed from [employer], patients will apply the Pritikin dining out guidelines that were presented in the [employer] video. Patients will also be able to practice these guidelines in a variety of provided scenarios. The purpose of this lesson is to provide patients with the opportunity to practice hands-on learning of the Pritikin Dining Out guidelines with actual menus and practice scenarios.  Label Reading Clinical staff led group instruction and group discussion with PowerPoint presentation and patient guidebook. To enhance the learning environment the use of posters, models and videos may be added. Patients will review and discuss the Pritikin label reading guidelines presented in Pritikin's Label Reading Educational series video. Using fool labels brought in from local grocery stores and markets, patients will apply the label reading guidelines and determine if the packaged food meet the Pritikin guidelines. The purpose of this lesson is to provide patients with the opportunity to review, discuss, and practice hands-on learning of the Pritikin Label Reading guidelines with actual packaged food labels. Cooking School  Pritikin's [employer] are designed to teach patients ways to prepare quick, simple, and affordable recipes at home. The importance of nutrition's role in chronic disease risk reduction is reflected in its emphasis in the overall Pritikin program. By learning how to prepare essential core Pritikin Eating Plan recipes, patients will increase control over what they eat; be able to customize the flavor of foods without the use of added salt, sugar, or fat; and improve the quality of the food they consume. By learning a set of core recipes which are easily assembled, quickly prepared, and affordable, patients are more likely to prepare more healthy foods at home. These workshops focus on convenient breakfasts, simple entres, side dishes, and desserts which can be prepared with minimal effort and are consistent with nutrition recommendations for cardiovascular risk reduction. Cooking  [employer] are taught by a Armed forces logistics/support/administrative officer (RD) who has been trained by the [employer]. The chef or RD has a clear understanding of the importance of minimizing - if not completely eliminating - added fat, sugar, and sodium in recipes. Throughout the series of Cooking School Workshop sessions, patients will learn about healthy ingredients and efficient methods of cooking to build confidence in their capability to prepare    Cooking School weekly topics:  Adding Flavor- Sodium-Free  Fast and Healthy Breakfasts  Powerhouse Plant-Based Proteins  Satisfying Salads and Dressings  Simple Sides and Sauces  International Cuisine-Spotlight on the [employer] Zones  Delicious Desserts  Savory Soups  [employer] - Meals in a Astronomer Appetizers and Snacks  Comforting Weekend Breakfasts  One-Pot Wonders   Fast Evening Meals  Landscape architect Your Pritikin Plate  WORKSHOPS   Healthy Mindset (Psychosocial):  Focused Goals, Sustainable Changes Clinical staff led group instruction and group discussion with PowerPoint  presentation and patient guidebook. To enhance the learning environment the use of posters, models and videos may be added. Patients will be able to apply effective goal setting strategies to establish at least one personal goal, and then take consistent, meaningful action toward that goal. They will learn to identify common barriers to achieving personal goals and develop strategies to overcome them. Patients will also gain an understanding of how our mind-set can impact our ability to achieve goals and the importance of cultivating a positive and growth-oriented mind-set. The purpose of this lesson is to provide patients with a deeper understanding of how to set and achieve personal goals, as well as the tools and strategies needed to overcome common obstacles which may arise along the way.  From Head to Heart: The Power of a Healthy  Outlook  Clinical staff led group instruction and group discussion with PowerPoint presentation and patient guidebook. To enhance the learning environment the use of posters, models and videos may be added. Patients will be able to recognize and describe the impact of emotions and mood on physical health. They will discover the importance of self-care and explore self-care practices which may work for them. Patients will also learn how to utilize the 4 C's to cultivate a healthier outlook and better manage stress and challenges. The purpose of this lesson is to demonstrate to patients how a healthy outlook is an essential part of maintaining good health, especially as they continue their cardiac rehab journey.  Healthy Sleep for a Healthy Heart Clinical staff led group instruction and group discussion with PowerPoint presentation and patient guidebook. To enhance the learning environment the use of posters, models and videos may be added. At the conclusion of this workshop, patients will be able to demonstrate knowledge of the importance of sleep to overall health, well-being, and quality of life. They will understand the symptoms of, and treatments for, common sleep disorders. Patients will also be able to identify daytime and nighttime behaviors which impact sleep, and they will be able to apply these tools to help manage sleep-related challenges. The purpose of this lesson is to provide patients with a general overview of sleep and outline the importance of quality sleep. Patients will learn about a few of the most common sleep disorders. Patients will also be introduced to the concept of "sleep hygiene," and discover ways to self-manage certain sleeping problems through simple daily behavior changes. Finally, the workshop will motivate patients by clarifying the links between quality sleep and their goals of heart-healthy living.   Recognizing and Reducing Stress Clinical staff led group instruction and  group discussion with PowerPoint presentation and patient guidebook. To enhance the learning environment the use of posters, models and videos may be added. At the conclusion of this workshop, patients will be able to understand the types of stress reactions, differentiate between acute and chronic stress, and recognize the impact that chronic stress has on their health. They will also be able to apply different coping mechanisms, such as reframing negative self-talk. Patients will have the opportunity to practice a variety of stress management techniques, such as deep abdominal breathing, progressive muscle relaxation, and/or guided imagery.  The purpose of this lesson is to educate patients on the role of stress in their lives and to provide healthy techniques for coping with it.  Learning Barriers/Preferences:  Learning Barriers/Preferences - 08/05/23 1344       Learning Barriers/Preferences   Learning Barriers None    Learning Preferences Audio;Computer/Internet;Group Instruction;Individual Instruction;Pictoral;Skilled Demonstration;Verbal  Instruction;Video;Written Material          Education Topics:  Knowledge Questionnaire Score:  Knowledge Questionnaire Score - 08/05/23 1517       Knowledge Questionnaire Score   Pre Score 21/24          Core Components/Risk Factors/Patient Goals at Admission:  Personal Goals and Risk Factors at Admission - 08/05/23 1346       Core Components/Risk Factors/Patient Goals on Admission   Hypertension Yes    Intervention Provide education on lifestyle modifcations including regular physical activity/exercise, weight management, moderate sodium restriction and increased consumption of fresh fruit, vegetables, and low fat dairy, alcohol moderation, and smoking cessation.;Monitor prescription use compliance.    Expected Outcomes Short Term: Continued assessment and intervention until BP is < 140/36mm HG in hypertensive participants. < 130/1mm HG in  hypertensive participants with diabetes, heart failure or chronic kidney disease.;Long Term: Maintenance of blood pressure at goal levels.    Lipids Yes    Intervention Provide education and support for participant on nutrition & aerobic/resistive exercise along with prescribed medications to achieve LDL 70mg , HDL >40mg .    Expected Outcomes Short Term: Participant states understanding of desired cholesterol values and is compliant with medications prescribed. Participant is following exercise prescription and nutrition guidelines.;Long Term: Cholesterol controlled with medications as prescribed, with individualized exercise RX and with personalized nutrition plan. Value goals: LDL < 70mg , HDL > 40 mg.    Personal Goal Other Yes    Personal Goal Improve upper & lower body strength, be able to pick things up from the floor, be able to tie shoes and/ or put socks on comfortably. Decrease intake of red meat and sweets. Visit more with friends. Slow down on work activities and take time to enjoy life.    Intervention Mr. Sanden will participate in Exercise, Nutrition, and Healthy Mind-Set workshops. He will develop an Exercise Action Plan to help increase strength, stamina, and flexibility.    Expected Outcomes Mr Overall will comply with Exercise and Nutrition Action plans to help achieve personal goals as measured by self-report and functional tests.          Core Components/Risk Factors/Patient Goals Review:   Goals and Risk Factor Review     Row Name 08/11/23 1630 08/27/23 0749 09/24/23 0746 10/21/23 1531       Core Components/Risk Factors/Patient Goals Review   Personal Goals Review Weight Management/Obesity;Hypertension;Lipids;Stress Weight Management/Obesity;Hypertension;Lipids;Stress Weight Management/Obesity;Hypertension;Lipids;Stress Weight Management/Obesity;Hypertension;Lipids;Stress    Review Bud started cardiac rehab on 08/11/23. Bud did well with exercise. Vital signs were stable. Bud is  off to a good start with with exercise. Vital signs have been  stable. Bud returned to exercise after being absent due to atrial fib. Bud is doing well with exercise at cardiac rehab. Vital signs have been  stable. Bud has increased his workloads. A paced Bud continues to do  well with exercise at cardiac rehab. Vital sign remain stable. Bud has increased his workloads. Systolic BP resting in the 90's. Asymptomatic. Will continue to monitor BP. Bud will tenatively complete cardiac rehab at the end of July.    Expected Outcomes Bud will continue to participate in cardiac rehab for exercise, nutrition and lifestyle modofications Bud will continue to participate in cardiac rehab for exercise, nutrition and lifestyle modofications Bud will continue to participate in cardiac rehab for exercise, nutrition and lifestyle modofications Bud will continue to participate in cardiac rehab for exercise, nutrition and lifestyle modofications       Core Components/Risk Factors/Patient  Goals at Discharge (Final Review):   Goals and Risk Factor Review - 10/21/23 1531       Core Components/Risk Factors/Patient Goals Review   Personal Goals Review Weight Management/Obesity;Hypertension;Lipids;Stress    Review Bud continues to do  well with exercise at cardiac rehab. Vital sign remain stable. Bud has increased his workloads. Systolic BP resting in the 90's. Asymptomatic. Will continue to monitor BP. Bud will tenatively complete cardiac rehab at the end of July.    Expected Outcomes Bud will continue to participate in cardiac rehab for exercise, nutrition and lifestyle modofications          ITP Comments:  ITP Comments     Row Name 08/05/23 1254 08/11/23 1628 08/27/23 0746 09/24/23 0742 10/21/23 1529   ITP Comments Medical Director- Dr. Wilbert Bihari, MD. Introduction to the Pritikin Education / Intensive Cardiac Rehab Program. Reviewed intital orientation folder with patient. 30 Day ITP Review. Timon Bud started  cardiac rehab on 08/11/23. Bud did well with exercise. 30 Day ITP Review. Amaar Bud returned to exercise at  cardiac rehab on 08/22/23. Bud is off to a good start with exercise. 30 Day ITP Review. Marshall Kampf has good attendance and participation with exercise at  cardiac rehab 30 Day ITP Review. Stiles Maxcy continues to have  good attendance and participation with exercise at  cardiac rehab      Comments: See ITP comments

## 2023-10-22 ENCOUNTER — Encounter (HOSPITAL_COMMUNITY)
Admission: RE | Admit: 2023-10-22 | Discharge: 2023-10-22 | Disposition: A | Discharge status: Home / Self Care | Source: Ambulatory Visit | Attending: Internal Medicine | Admitting: Internal Medicine

## 2023-10-22 DIAGNOSIS — Z9889 Other specified postprocedural states: Secondary | ICD-10-CM | POA: Diagnosis not present

## 2023-10-22 DIAGNOSIS — Z48812 Encounter for surgical aftercare following surgery on the circulatory system: Secondary | ICD-10-CM | POA: Diagnosis not present

## 2023-10-22 DIAGNOSIS — Z7901 Long term (current) use of anticoagulants: Secondary | ICD-10-CM | POA: Diagnosis not present

## 2023-10-24 ENCOUNTER — Encounter (HOSPITAL_COMMUNITY): Admission: RE | Admit: 2023-10-24 | Source: Ambulatory Visit

## 2023-10-27 ENCOUNTER — Encounter (HOSPITAL_COMMUNITY)
Admission: RE | Admit: 2023-10-27 | Discharge: 2023-10-27 | Disposition: A | Discharge status: Home / Self Care | Source: Ambulatory Visit | Attending: Internal Medicine

## 2023-10-27 DIAGNOSIS — Z48812 Encounter for surgical aftercare following surgery on the circulatory system: Secondary | ICD-10-CM | POA: Diagnosis not present

## 2023-10-27 DIAGNOSIS — Z9889 Other specified postprocedural states: Secondary | ICD-10-CM | POA: Diagnosis not present

## 2023-10-27 DIAGNOSIS — Z7901 Long term (current) use of anticoagulants: Secondary | ICD-10-CM | POA: Diagnosis not present

## 2023-10-29 ENCOUNTER — Encounter (HOSPITAL_COMMUNITY): Admission: RE | Admit: 2023-10-29 | Source: Ambulatory Visit

## 2023-10-31 ENCOUNTER — Encounter (HOSPITAL_COMMUNITY)
Admission: RE | Admit: 2023-10-31 | Discharge: 2023-10-31 | Disposition: A | Discharge status: Home / Self Care | Source: Ambulatory Visit | Attending: Internal Medicine | Admitting: Internal Medicine

## 2023-10-31 DIAGNOSIS — Z9889 Other specified postprocedural states: Secondary | ICD-10-CM | POA: Diagnosis not present

## 2023-10-31 DIAGNOSIS — Z48812 Encounter for surgical aftercare following surgery on the circulatory system: Secondary | ICD-10-CM | POA: Diagnosis not present

## 2023-10-31 DIAGNOSIS — Z7901 Long term (current) use of anticoagulants: Secondary | ICD-10-CM | POA: Diagnosis not present

## 2023-11-03 ENCOUNTER — Encounter (HOSPITAL_COMMUNITY)
Admission: RE | Admit: 2023-11-03 | Discharge: 2023-11-03 | Disposition: A | Discharge status: Home / Self Care | Source: Ambulatory Visit | Attending: Internal Medicine | Admitting: Internal Medicine

## 2023-11-03 DIAGNOSIS — Z9889 Other specified postprocedural states: Secondary | ICD-10-CM

## 2023-11-03 DIAGNOSIS — Z48812 Encounter for surgical aftercare following surgery on the circulatory system: Secondary | ICD-10-CM | POA: Diagnosis not present

## 2023-11-03 DIAGNOSIS — Z7901 Long term (current) use of anticoagulants: Secondary | ICD-10-CM | POA: Diagnosis not present

## 2023-11-05 ENCOUNTER — Encounter (HOSPITAL_COMMUNITY)
Admission: RE | Admit: 2023-11-05 | Discharge: 2023-11-05 | Disposition: A | Discharge status: Home / Self Care | Source: Ambulatory Visit | Attending: Internal Medicine

## 2023-11-05 DIAGNOSIS — Z48812 Encounter for surgical aftercare following surgery on the circulatory system: Secondary | ICD-10-CM | POA: Diagnosis not present

## 2023-11-05 DIAGNOSIS — Z9889 Other specified postprocedural states: Secondary | ICD-10-CM | POA: Diagnosis not present

## 2023-11-05 DIAGNOSIS — Z7901 Long term (current) use of anticoagulants: Secondary | ICD-10-CM | POA: Diagnosis not present

## 2023-11-05 DIAGNOSIS — N200 Calculus of kidney: Secondary | ICD-10-CM | POA: Diagnosis not present

## 2023-11-06 ENCOUNTER — Telehealth: Payer: Self-pay

## 2023-11-06 ENCOUNTER — Telehealth: Payer: Self-pay | Admitting: Internal Medicine

## 2023-11-06 NOTE — Progress Notes (Signed)
 Discharge Progress Report  Patient Details  Name: MENACHEM URBANEK MRN: [id number] Date of Birth: 1945-09-17 Referring Provider:   Flowsheet Row INTENSIVE CARDIAC REHAB ORIENT from 08/05/2023 in [facility]  Referring Provider Thukkani, Arun K, MD     Number of Visits: 38  Reason for Discharge:  Patient reached a stable level of exercise. Patient independent in their exercise. Patient has met program and personal goals.  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 - Plan: Amb Referral To Provider Referral Exercise Program (P.R.E.P)  ADL UCSD:   Initial Exercise Prescription:  Initial Exercise Prescription - 08/05/23 1500       Date of Initial Exercise RX and Referring Provider   Date 08/05/23    Referring Provider Thukkani, Arun K, MD    Expected Discharge Date 10/29/23      Recumbant Bike   Level 1    Watts 10    Minutes 15    METs 2.2      NuStep   Level 1    SPM 85    Minutes 15    METs 2.2      Prescription Details   Frequency (times per week) 3    Duration Progress to 30 minutes of continuous aerobic without signs/symptoms of physical distress      Intensity   THRR 40-80% of Max Heartrate 56-112    Ratings of Perceived Exertion 11-13      Progression   Progression Continue to progress workloads to maintain intensity without signs/symptoms of physical distress.      Resistance Training   Training Prescription Yes    Weight 3 lbs    Reps 10-15          Discharge Exercise Prescription (Final Exercise Prescription Changes):  Exercise Prescription Changes - 11/05/23 1657       Response to Exercise   Blood Pressure (Admit) 124/70    Blood Pressure (Exercise) 132/82    Blood Pressure (Exit)  118/62    Heart Rate (Admit) 74 bpm    Heart Rate (Exercise) 90 bpm    Heart Rate (Exit) 70 bpm    Rating of Perceived Exertion (Exercise) 11.5    Perceived Dyspnea (Exercise) 0    Symptoms 0    Comments Pt graduated teh [facility] ICR program    Duration Continue with 30 min of aerobic exercise without signs/symptoms of physical distress.    Intensity THRR unchanged      Progression   Progression Continue to progress workloads to maintain intensity without signs/symptoms of physical distress.    Average METs 3.77      Resistance Training   Training Prescription No    Weight 3    Reps 10-15    Time 5 Minutes      Interval Training   Interval Training No      Recumbant Elliptical   Level 7    RPM 57    Watts 90    Minutes 15    METs 4.4      Track   Laps 12   post 1433ft   Minutes 6    METs 3.13      Home Exercise Plan   Plans to continue exercise at Home (comment)  Frequency Add 2 additional days to program exercise sessions.    Initial Home Exercises Provided 08/27/23          Functional Capacity:  6 Minute Walk     Row Name 08/05/23 1423 11/05/23 1655       6 Minute Walk   Phase Initial Discharge    Distance 1282 feet 1465 feet    Distance % Change -- 14.27 %    Distance Feet Change -- 183 ft    Walk Time 6 minutes 6 minutes    # of Rest Breaks 0 0    MPH 2.43 2.77    METS 2.34 2.52    RPE 9 11    Perceived Dyspnea  9 0    VO2 Peak 8.2 8.81    Symptoms Yes (comment) No    Comments Mild shortness of breath. --    Resting HR 69 bpm 74 bpm    Resting BP 122/78 124/70    Resting Oxygen  Saturation  98 % --    Exercise Oxygen  Saturation  during 6 min walk 97 % --    Max Ex. HR 84 bpm 78 bpm    Max Ex. BP 148/82 132/82    2 Minute Post BP 142/84 120/82       Psychological, QOL, Others - Outcomes: PHQ 2/9:    11/06/2023    8:40 AM 08/05/2023    3:17 PM 07/09/2023    4:26 PM 07/09/2023    4:25 PM 04/07/2023    8:24 AM  Depression screen PHQ  2/9  Decreased Interest 0 0 0 0 0  Down, Depressed, Hopeless 0 0 0 0 0  PHQ - 2 Score 0 0 0 0 0  Altered sleeping 0 1 0    Tired, decreased energy 0 1 1    Change in appetite 0 0 0    Feeling bad or failure about yourself  0 0 0    Trouble concentrating 0 0 0    Moving slowly or fidgety/restless 0 0 0    Suicidal thoughts 0 0 0    PHQ-9 Score 0 2 1    Difficult doing work/chores Not difficult at all        Quality of Life:  Quality of Life - 11/03/23 1630       Quality of Life   Select Quality of Life      Quality of Life Scores   Health/Function Post 27.89 %    Socioeconomic Post 27.08 %    Psych/Spiritual Post 25.57 %    Family Post 24 %    GLOBAL Post 26.71 %          Personal Goals: Goals established at orientation with interventions provided to work toward goal.  Personal Goals and Risk Factors at Admission - 08/05/23 1346       Core Components/Risk Factors/Patient Goals on Admission   Hypertension Yes    Intervention Provide education on lifestyle modifcations including regular physical activity/exercise, weight management, moderate sodium restriction and increased consumption of fresh fruit, vegetables, and low fat dairy, alcohol moderation, and smoking cessation.;Monitor prescription use compliance.    Expected Outcomes Short Term: Continued assessment and intervention until BP is < 140/79mm HG in hypertensive participants. < 130/59mm HG in hypertensive participants with diabetes, heart failure or chronic kidney disease.;Long Term: Maintenance of blood pressure at goal levels.    Lipids Yes    Intervention Provide education and support for participant on nutrition & aerobic/resistive exercise along  with prescribed medications to achieve LDL 70mg , HDL >40mg .    Expected Outcomes Short Term: Participant states understanding of desired cholesterol values and is compliant with medications prescribed. Participant is following exercise prescription and nutrition  guidelines.;Long Term: Cholesterol controlled with medications as prescribed, with individualized exercise RX and with personalized nutrition plan. Value goals: LDL < 70mg , HDL > 40 mg.    Personal Goal Other Yes    Personal Goal Improve upper & lower body strength, be able to pick things up from the floor, be able to tie shoes and/ or put socks on comfortably. Decrease intake of red meat and sweets. Visit more with friends. Slow down on work activities and take time to enjoy life.    Intervention Mr. Selvey will participate in Exercise, Nutrition, and Healthy Mind-Set workshops. He will develop an Exercise Action Plan to help increase strength, stamina, and flexibility.    Expected Outcomes Mr Pak will comply with Exercise and Nutrition Action plans to help achieve personal goals as measured by self-report and functional tests.           Personal Goals Discharge:  Goals and Risk Factor Review     Row Name 08/11/23 1630 08/27/23 0749 09/24/23 0746 10/21/23 1531       Core Components/Risk Factors/Patient Goals Review   Personal Goals Review Weight Management/Obesity;Hypertension;Lipids;Stress Weight Management/Obesity;Hypertension;Lipids;Stress Weight Management/Obesity;Hypertension;Lipids;Stress Weight Management/Obesity;Hypertension;Lipids;Stress    Review Bud started cardiac rehab on 08/11/23. Bud did well with exercise. Vital signs were stable. Bud is off to a good start with with exercise. Vital signs have been  stable. Bud returned to exercise after being absent due to atrial fib. Bud is doing well with exercise at cardiac rehab. Vital signs have been  stable. Bud has increased his workloads. A paced Bud continues to do  well with exercise at cardiac rehab. Vital sign remain stable. Bud has increased his workloads. Systolic BP resting in the 90's. Asymptomatic. Will continue to monitor BP. Bud will tenatively complete cardiac rehab at the end of July.    Expected Outcomes Bud will continue to  participate in cardiac rehab for exercise, nutrition and lifestyle modofications Bud will continue to participate in cardiac rehab for exercise, nutrition and lifestyle modofications Bud will continue to participate in cardiac rehab for exercise, nutrition and lifestyle modofications Bud will continue to participate in cardiac rehab for exercise, nutrition and lifestyle modofications       Exercise Goals and Review:  Exercise Goals     Row Name 08/05/23 1342             Exercise Goals   Increase Physical Activity Yes       Intervention Provide advice, education, support and counseling about physical activity/exercise needs.;Develop an individualized exercise prescription for aerobic and resistive training based on initial evaluation findings, risk stratification, comorbidities and participant's personal goals.       Expected Outcomes Short Term: Attend rehab on a regular basis to increase amount of physical activity.;Long Term: Exercising regularly at least 3-5 days a week.;Long Term: Add in home exercise to make exercise part of routine and to increase amount of physical activity.       Increase Strength and Stamina Yes       Intervention Provide advice, education, support and counseling about physical activity/exercise needs.;Develop an individualized exercise prescription for aerobic and resistive training based on initial evaluation findings, risk stratification, comorbidities and participant's personal goals.       Expected Outcomes Short Term: Increase workloads from initial  exercise prescription for resistance, speed, and METs.;Short Term: Perform resistance training exercises routinely during rehab and add in resistance training at home;Long Term: Improve cardiorespiratory fitness, muscular endurance and strength as measured by increased METs and functional capacity ( )       Able to understand and use rate of perceived exertion (RPE) scale Yes       Intervention Provide education and  explanation on how to use RPE scale       Expected Outcomes Short Term: Able to use RPE daily in rehab to express subjective intensity level;Long Term:  Able to use RPE to guide intensity level when exercising independently       Knowledge and understanding of Target Heart Rate Range (THRR) Yes       Intervention Provide education and explanation of THRR including how the numbers were predicted and where they are located for reference       Expected Outcomes Short Term: Able to state/look up THRR;Long Term: Able to use THRR to govern intensity when exercising independently;Short Term: Able to use daily as guideline for intensity in rehab       Able to check pulse independently Yes       Intervention Provide education and demonstration on how to check pulse in carotid and radial arteries.;Review the importance of being able to check your own pulse for safety during independent exercise       Expected Outcomes Short Term: Able to explain why pulse checking is important during independent exercise;Long Term: Able to check pulse independently and accurately       Understanding of Exercise Prescription Yes       Intervention Provide education, explanation, and written materials on patient's individual exercise prescription       Expected Outcomes Short Term: Able to explain program exercise prescription;Long Term: Able to explain home exercise prescription to exercise independently          Exercise Goals Re-Evaluation:  Exercise Goals Re-Evaluation     Row Name 08/11/23 1605 08/27/23 1637 09/26/23 1630 11/05/23 1659       Exercise Goal Re-Evaluation   Exercise Goals Review Increase Physical Activity;Increase Strength and Stamina;Able to understand and use rate of perceived exertion (RPE) scale Increase Physical Activity;Increase Strength and Stamina;Able to understand and use rate of perceived exertion (RPE) scale Increase Physical Activity;Increase Strength and Stamina;Able to understand and use  rate of perceived exertion (RPE) scale Increase Physical Activity;Increase Strength and Stamina;Able to understand and use rate of perceived exertion (RPE) scale    Comments Patient was able to understand and use RPE scale appropriately. Reviewed MET's, goals and home ExRx. Pt tolerated exercise well with an average MET level of 2.5. Pt is already feeling an increase in strength nad flexibility. Talked over some exercises he can do at home to help him with tasks he wants to accomplish: being able to pick things up from the floor, tie his shoes more comfortable, get up easier from the floor. He is already doing a flexibility routine, he's also going to walk, golf, try chair fitness and some hand weights for 1-2 days for 30 ish mins (may need to break up into two sessions) Reviewed MET's and goals. Pt tolerated exercise well with an average MET level of 3.55. Pt is doing well and progressing MET's. He is feeling stronger and his ADL's are getting easier. He says he definitely feels and improvement Pt graduated the [employer] program. Pt tolerated exercise well with an average MET level of 3.77.  Pt Did very well in the program and increase on his post by 14ft for a total of 1469ft. He will continue to exercise by going to the [employer], walkign, weights, golf and chair fitness on youtube for 5-7 days for 20-60 mins per session    Expected Outcomes Progress workloads as tolerated to help increase strength and stamina. Progress workloads as tolerated to help increase strength and stamina. Progress workloads as tolerated to help increase strength and stamina. Pt will continue to exercise on his own and gain strength       Nutrition & Weight - Outcomes:  Pre Biometrics - 08/05/23 1254       Pre Biometrics   Waist Circumference 40.75 inches    Hip Circumference 40.25 inches    Waist to Hip Ratio 1.01 %    Triceps Skinfold 18 mm    % Body Fat 28.7 %    Grip Strength 34 kg    Flexibility 0 in    knees bent   Single Leg Stand 30 seconds           Nutrition:  Nutrition Therapy & Goals - 11/05/23 1527       Nutrition Therapy   Diet Heart Healthy Diet    Drug/Food Interactions Statins/Certain Fruits      Personal Nutrition Goals   Nutrition Goal Patient to identify strategies for reducing cardiovascular risk by attending the [facility] education and nutrition series weekly.   goal met.   Personal Goal #2 Patient to improve diet quality by using the plate method as a guide for meal planning to include lean protein/plant protein, fruits, vegetables, whole grains, nonfat dairy as part of a well-balanced diet.   goal in action.   Personal Goal #3 Patient to reduce sodium to 1500mg  per day.   goal in action.   Comments Goals in action. Bud has medical history of MV + TV repair, HFrEF, TIA, HTN. LDL is not at goal; atorvastatin  was increased on 10/06/23. He has attended the [employer] education and nutrition series regularly. Losartan  added at 07/25/23 cardiology visit to aid with improved blood pressure control. 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 Hemoglobin has improved WNL. He has lost 1.5# since starting with our since starting with our program. Patient will benefit from adherence to nutrition, exercise, and lifestyle modification.      Intervention Plan   Intervention Prescribe, educate and counsel regarding individualized specific dietary modifications aiming towards targeted core components such as weight, hypertension, lipid management, diabetes, heart failure and other comorbidities.;Nutrition handout(s) given to patient.    Expected Outcomes Short Term Goal: Understand basic principles of dietary content, such as calories, fat, sodium, cholesterol and nutrients.;Long Term Goal: Adherence to prescribed nutrition plan.          Nutrition Discharge:  Nutrition Assessments - 11/05/23  1331       Rate Your Plate Scores   Post Score 56          Education Questionnaire Score:  Knowledge Questionnaire Score - 11/03/23 1630       Knowledge Questionnaire Score   Post Score 18/24          Goals reviewed with patient; copy given to patient.Pt graduates from  Intensive/Traditional cardiac rehab program on 11/05/23  with completion of  62 exercise and education sessions. Pt maintained good attendance and progressed nicely during their participation in rehab as evidenced by  increased MET level. Bud increase distance on his post exercise walk test by 183 feet.  Medication list reconciled. Repeat  PHQ score- 0 .  Pt has made significant lifestyle changes and should be commended for their success. Bud achieved their goals during cardiac rehab.   Pt plans to continue exercise at  the [facility] prep program, walking using weights at home and U tube chair fitness chair exercises. We are proud of Bud's progress!Hadassah Elpidio Quan RN BSN

## 2023-11-06 NOTE — Telephone Encounter (Signed)
 Called [id number] program referral; left voicemail requesting return call.

## 2023-11-06 NOTE — Telephone Encounter (Signed)
   Pre-operative Risk Assessment    Patient Name: [name] [name]  DOB: 16-Feb-1946 MRN: [id number]   Date of last office visit: 10/02/2023 Date of next office visit: TBD   Request for Surgical Clearance    Procedure:  Ureteroscopy stone removal first stage   Date of Surgery:  Clearance TBD                                Surgeon:  Dr. Watt Socks Group or Practice Name:  [facility]  Phone number:  [phone] [id number] Fax number:  [phone]   Type of Clearance Requested:   - Medical  - Pharmacy:  Hold Apixaban  (Eliquis ) 2 days prior   Type of Anesthesia:  General    Additional requests/questions:    Bonney Bernarda JONETTA Melvenia   11/06/2023, 2:16 PM

## 2023-11-07 ENCOUNTER — Other Ambulatory Visit: Payer: Self-pay | Admitting: *Deleted

## 2023-11-07 DIAGNOSIS — E785 Hyperlipidemia, unspecified: Secondary | ICD-10-CM

## 2023-11-07 MED ORDER — ATORVASTATIN CALCIUM 40 MG PO TABS: 40.0000 mg | ORAL_TABLET | Freq: Every day | ORAL | 3 refills | Status: AC

## 2023-11-07 NOTE — Telephone Encounter (Signed)
 Detailed message of results and to call if any questions.  Mychart message shows not read yet.

## 2023-11-10 ENCOUNTER — Telehealth: Payer: Self-pay

## 2023-11-10 ENCOUNTER — Encounter (HOSPITAL_COMMUNITY): Payer: Self-pay | Admitting: Gastroenterology

## 2023-11-10 DIAGNOSIS — Z85828 Personal history of other malignant neoplasm of skin: Secondary | ICD-10-CM | POA: Diagnosis not present

## 2023-11-10 DIAGNOSIS — L820 Inflamed seborrheic keratosis: Secondary | ICD-10-CM | POA: Diagnosis not present

## 2023-11-10 DIAGNOSIS — D485 Neoplasm of uncertain behavior of skin: Secondary | ICD-10-CM | POA: Diagnosis not present

## 2023-11-10 DIAGNOSIS — D2262 Melanocytic nevi of left upper limb, including shoulder: Secondary | ICD-10-CM | POA: Diagnosis not present

## 2023-11-10 DIAGNOSIS — L603 Nail dystrophy: Secondary | ICD-10-CM | POA: Diagnosis not present

## 2023-11-10 DIAGNOSIS — L905 Scar conditions and fibrosis of skin: Secondary | ICD-10-CM | POA: Diagnosis not present

## 2023-11-10 DIAGNOSIS — L570 Actinic keratosis: Secondary | ICD-10-CM | POA: Diagnosis not present

## 2023-11-10 DIAGNOSIS — L720 Epidermal cyst: Secondary | ICD-10-CM | POA: Diagnosis not present

## 2023-11-10 NOTE — Telephone Encounter (Signed)
 Patient with diagnosis of A Fib on Eliquis  for anticoagulation.    Procedure: Ureteroscopy stone removal first stage  Date of procedure: TBD   CHA2DS2-VASc Score = 7  This indicates a 11.2% annual risk of stroke. The patient's score is based upon: CHF History: 1 HTN History: 1 Diabetes History: 0 Stroke History: 2 Vascular Disease History: 1 Age Score: 2 Gender Score: 0    CrCl 69 ml/min Platelet count 251K  Per office protocol, patient can hold Eliquis  for 2 days prior to procedure.    **This guidance is not considered finalized until pre-operative APP has relayed final recommendations.**

## 2023-11-10 NOTE — Progress Notes (Signed)
 Attempted to obtain medical history for pre op call via telephone, unable to reach at this time. HIPAA compliant voicemail message left requesting return call to pre surgical testing department.

## 2023-11-10 NOTE — Telephone Encounter (Signed)
 Procedure:Colon Procedure date: 11/17/23 Procedure location: [facility] Arrival Time: 6:15 am Spoke with the patient Y/N: n Call pt 8/4 pt doesn't have voice mail. I will try again tomorrow Any prep concerns? .  Has the patient obtained the prep from the pharmacy ? [name] Do you have a care partner and transportation: . Any additional concerns? [name] [name]

## 2023-11-10 NOTE — Telephone Encounter (Signed)
     Primary Cardiologist: Arun K Thukkani, MD  Chart reviewed as part of pre-operative protocol coverage. Given past medical history and time since last visit, based on ACC/AHA guidelines, [name] [name] would be at acceptable risk for the planned procedure without further cardiovascular testing.   Patient with diagnosis of A Fib on Eliquis  for anticoagulation.     Procedure: Ureteroscopy stone removal first stage  Date of procedure: TBD     CHA2DS2-VASc Score = 7  This indicates a 11.2% annual risk of stroke. The patient's score is based upon: CHF History: 1 HTN History: 1 Diabetes History: 0 Stroke History: 2 Vascular Disease History: 1 Age Score: 2 Gender Score: 0     CrCl 69 ml/min Platelet count 251K   Per office protocol, patient can hold Eliquis  for 2 days prior to procedure.  I will route this recommendation to the requesting party via Epic fax function and remove from pre-op pool.  Please call with questions.  [name] HERO. Doylene Splinter NP-C     11/10/2023, 2:57 PM [facility] [address] Office [phone] Fax [phone]

## 2023-11-13 ENCOUNTER — Telehealth: Payer: Self-pay | Admitting: Physician Assistant

## 2023-11-13 DIAGNOSIS — Z860101 Personal history of adenomatous and serrated colon polyps: Secondary | ICD-10-CM

## 2023-11-13 DIAGNOSIS — D126 Benign neoplasm of colon, unspecified: Secondary | ICD-10-CM

## 2023-11-13 NOTE — Telephone Encounter (Signed)
 PT is scheduled for a colonoscopy on 8/11 and he just found out on yesterday that he has an infection from kidney stones. He would like to know if he will still be able to still have the procedure done. Please advise.

## 2023-11-13 NOTE — Telephone Encounter (Signed)
 Left message for pt to call back

## 2023-11-13 NOTE — Telephone Encounter (Signed)
 Pt states he had bacteria in his urine specimen. Urology is going to schedule a procedure to remove the kidney stone. They dont' want him to start the antibiotic yet. Pt wants to know if he can still proceed with colon on 8/11. Please advise.

## 2023-11-13 NOTE — Telephone Encounter (Signed)
 Spoke with pt and he is aware and know to call back to reschedule when he is better. Procedure cancelled.

## 2023-11-17 ENCOUNTER — Telehealth: Payer: Self-pay

## 2023-11-17 ENCOUNTER — Encounter (HOSPITAL_COMMUNITY): Admission: RE | Payer: Self-pay | Source: Home / Self Care

## 2023-11-17 ENCOUNTER — Ambulatory Visit (HOSPITAL_COMMUNITY): Admission: RE | Admit: 2023-11-17 | Source: Home / Self Care | Admitting: Gastroenterology

## 2023-11-17 SURGERY — COLONOSCOPY
Anesthesia: Monitor Anesthesia Care

## 2023-11-17 NOTE — Telephone Encounter (Signed)
 Returned his call; he reports having upcoming surgery, wants to attend PREP after that; have asked him in voicemail to let us  know a timeframe and best time to call him back.

## 2023-11-24 ENCOUNTER — Telehealth: Payer: Self-pay

## 2023-11-24 ENCOUNTER — Other Ambulatory Visit: Payer: Self-pay | Admitting: Urology

## 2023-11-24 NOTE — Telephone Encounter (Signed)
 Returned his call, explained PREP, he has to have colonscopy and poss cysto w/stone extraction; wants us  to call him back in October to discuss fall PREP schedule at [facility].

## 2023-11-24 NOTE — Telephone Encounter (Signed)
 ok to schedule colonoscopy before kidney stone procedure . Please schedule next available appt for hospital endoscopy unit. Thanks

## 2023-11-24 NOTE — Telephone Encounter (Signed)
 Spoke with pt. Was originally scheduled for a colonoscopy at [facility] on 8/11. Appointment canceled due to patient having an infection secondary to kidney stones. Patient states he is having no symptoms and he was never placed on abts for the infection. He reports that he has been told he will not be able to have his kidney stone surgery until 01/13/2024.Per colonoscopy report in 06/2023 Incidentally has a really large transverse polyp, amongst many others, superficial biopsies show adenoma. It did not appear ulcerated, I think unlikely the cause of his bleeding but needs removal once he has recovered from his acute issues, he was just transferred to rehab. Patient would like to know if we can proceed with the colonoscopy before he has the kidney stone removed. Did advise patient that since he is a hospital case, we may not be able to get him in prior to his surgery date.

## 2023-11-24 NOTE — Telephone Encounter (Signed)
 Inbound call from patient requesting to speak with a nurse.   Patient would like to inquire if he is able to have the coloscopy done before he has kidney surgery in October?   Also would like to discuss being placed on an  antibiotic. Please advise.   Thank you

## 2023-11-24 NOTE — Telephone Encounter (Signed)
 Nandigam pt, last seen by Ellouise Console PA in May. Calling to reschedule procedure.

## 2023-11-25 ENCOUNTER — Other Ambulatory Visit: Payer: Self-pay

## 2023-11-25 ENCOUNTER — Encounter: Payer: Self-pay | Admitting: Gastroenterology

## 2023-11-25 MED ORDER — NA SULFATE-K SULFATE-MG SULF 17.5-3.13-1.6 GM/177ML PO SOLN: 1.0000 | Freq: Once | ORAL | 0 refills | Status: AC

## 2023-11-25 NOTE — Addendum Note (Signed)
 Addended by: NICHOLAUS JARVIS on: 11/25/2023 10:53 AM   Modules accepted: Orders

## 2023-11-25 NOTE — Telephone Encounter (Signed)
 Patient is advised.

## 2023-11-25 NOTE — Telephone Encounter (Signed)
 If we have clearance from Cardiology regarding Eliquis , ok to proceed without office visit. Thanks

## 2023-11-25 NOTE — Telephone Encounter (Signed)
 Spoke with patient. Only available date prior to kidney stone procedure is 12/25/2023 at [facility]. Patient in agreement with coming for that date. Hospital procedure scheduled for 12/25/2023 at 11:00 AM with a 09:30 AM arrival time. Patient is on Eliquis . Cardiac clearance for original scheduled colonoscopy, received 10/07/2023, states that patient may hold his Eliquis  for 2 days prior to procedure date. Will send all instructions to patient via My chart per his request. Prep medication sent to pharmacy on file. Patient would like to know if he will need abts prior to having colonoscopy due to him originally having bacteria in his urine. Will route to provider for review.

## 2023-11-26 DIAGNOSIS — K080 Exfoliation of teeth due to systemic causes: Secondary | ICD-10-CM | POA: Diagnosis not present

## 2023-12-17 ENCOUNTER — Telehealth: Payer: Self-pay | Admitting: Gastroenterology

## 2023-12-17 NOTE — Telephone Encounter (Addendum)
 Procedure:Colonoscopy Procedure date: 12/25/23 Procedure location: [facility] Arrival Time: 9:30 am Spoke with the patient Y/N:   No, I left a detailed message on [phone] on 12/17/23 @ 3:42 pm for the patient to return call   No, I left a detailed message on [phone] on 12/17/23 @ 3:42 pm for the patient to return call, called [phone] and pt's son answered , he was told to get the patient to return call.  Any prep concerns? ___  Has the patient obtained the prep from the pharmacy ? ___ Do you have a care partner and transportation: ___ Any additional concerns? ___

## 2023-12-18 NOTE — Telephone Encounter (Signed)
 Inbound call from pt stating that he is aware of his colonoscopy at the [facility] with an arrival time 9:30 AM.Patient has obtain his prep and at this time has no concerns. Patient does have a driver and a care partner. Please advise.

## 2023-12-19 ENCOUNTER — Encounter (HOSPITAL_COMMUNITY): Payer: Self-pay | Admitting: Gastroenterology

## 2023-12-19 NOTE — Telephone Encounter (Signed)
 Noted! Thank you

## 2023-12-22 ENCOUNTER — Other Ambulatory Visit (HOSPITAL_COMMUNITY): Payer: Self-pay

## 2023-12-25 ENCOUNTER — Ambulatory Visit (HOSPITAL_COMMUNITY)
Admission: AD | Admit: 2023-12-25 | Discharge: 2023-12-25 | Disposition: A | Discharge status: Home / Self Care | Attending: Gastroenterology | Admitting: Gastroenterology

## 2023-12-25 ENCOUNTER — Ambulatory Visit (HOSPITAL_COMMUNITY): Admitting: Certified Registered Nurse Anesthetist

## 2023-12-25 ENCOUNTER — Encounter (HOSPITAL_COMMUNITY)
Admission: AD | Disposition: A | Discharge status: Home / Self Care | Payer: Self-pay | Source: Home / Self Care | Attending: Gastroenterology

## 2023-12-25 ENCOUNTER — Other Ambulatory Visit: Payer: Self-pay

## 2023-12-25 ENCOUNTER — Encounter (HOSPITAL_COMMUNITY): Payer: Self-pay | Admitting: Gastroenterology

## 2023-12-25 DIAGNOSIS — K635 Polyp of colon: Secondary | ICD-10-CM | POA: Insufficient documentation

## 2023-12-25 DIAGNOSIS — I110 Hypertensive heart disease with heart failure: Secondary | ICD-10-CM

## 2023-12-25 DIAGNOSIS — Z79899 Other long term (current) drug therapy: Secondary | ICD-10-CM | POA: Diagnosis not present

## 2023-12-25 DIAGNOSIS — K219 Gastro-esophageal reflux disease without esophagitis: Secondary | ICD-10-CM | POA: Insufficient documentation

## 2023-12-25 DIAGNOSIS — K648 Other hemorrhoids: Secondary | ICD-10-CM | POA: Insufficient documentation

## 2023-12-25 DIAGNOSIS — I480 Paroxysmal atrial fibrillation: Secondary | ICD-10-CM | POA: Diagnosis not present

## 2023-12-25 DIAGNOSIS — I10 Essential (primary) hypertension: Secondary | ICD-10-CM | POA: Insufficient documentation

## 2023-12-25 DIAGNOSIS — D123 Benign neoplasm of transverse colon: Secondary | ICD-10-CM | POA: Diagnosis not present

## 2023-12-25 DIAGNOSIS — I5020 Unspecified systolic (congestive) heart failure: Secondary | ICD-10-CM

## 2023-12-25 DIAGNOSIS — I4891 Unspecified atrial fibrillation: Secondary | ICD-10-CM

## 2023-12-25 DIAGNOSIS — Z7901 Long term (current) use of anticoagulants: Secondary | ICD-10-CM | POA: Diagnosis not present

## 2023-12-25 DIAGNOSIS — D122 Benign neoplasm of ascending colon: Secondary | ICD-10-CM | POA: Insufficient documentation

## 2023-12-25 HISTORY — PX: COLONOSCOPY: SHX5424

## 2023-12-25 LAB — GLUCOSE, CAPILLARY: Glucose-Capillary: 70 mg/dL (ref 70–99)

## 2023-12-25 SURGERY — COLONOSCOPY
Anesthesia: Monitor Anesthesia Care

## 2023-12-25 MED ORDER — PROPOFOL 500 MG/50ML IV EMUL
INTRAVENOUS | Status: DC | PRN
  Administered 2023-12-25: 50 mg via INTRAVENOUS
  Administered 2023-12-25: 100 ug/kg/min via INTRAVENOUS

## 2023-12-25 MED ORDER — LIDOCAINE 2% (20 MG/ML) 5 ML SYRINGE
INTRAMUSCULAR | Status: DC | PRN
  Administered 2023-12-25: 100 mg via INTRAVENOUS

## 2023-12-25 MED ORDER — SODIUM CHLORIDE 0.9 % IV SOLN: INTRAVENOUS | Status: DC | PRN

## 2023-12-25 MED ORDER — LACTATED RINGERS IV SOLN: INTRAVENOUS | Status: DC | PRN

## 2023-12-25 MED ORDER — SODIUM CHLORIDE 0.9 % IV SOLN
INTRAVENOUS | Status: AC | PRN
  Administered 2023-12-25: 500 mL via INTRAMUSCULAR

## 2023-12-25 NOTE — H&P (Signed)
 [facility] History and Physical   Primary Care Physician:  Duanne Butler DASEN, MD   Reason for Procedure:  Colon polyps removal  Plan:     colonoscopy with possible interventions as needed, polypectomy and EMR     HPI: [name] [name] is a very pleasant 78 y.o. male here for colonoscopy with removal of multiple polyps and possible EMR. On Eliquis , s/p Mitral and Tricuspid valve repair 05/27/23, MAZE   The risks and benefits as well as alternatives of endoscopic procedure(s) have been discussed and reviewed. All questions answered. The patient agrees to proceed.    Past Medical History:  Diagnosis Date   Arthritis    Back   CVA (cerebral infarction) 07/14/2009   TIA   Dysrhythmia    A. Fib   Heart murmur    History of kidney stones    Hyperlipidemia    Hypertension    Nephrolithiasis    Paroxysmal atrial fibrillation (HCC)    Paroxysmal atrial flutter (HCC)    a. s/p ablation 2012.   Prostate cancer [facility])    Stroke [facility])    TIA - no deficits    Past Surgical History:  Procedure Laterality Date   APPENDECTOMY  1967   BONE BIOPSY  06/14/2023   Procedure: BIOPSY,Colon;  Surgeon: Leigh Elspeth SQUIBB, MD;  Location: [facility] ENDOSCOPY;  Service: Gastroenterology;;   CARDIOVERSION N/A 06/03/2023   Procedure: CARDIOVERSION;  Surgeon: Lonni Slain, MD;  Location: [facility] INVASIVE CV LAB;  Service: Cardiovascular;  Laterality: N/A;   CATARACT EXTRACTION W/ INTRAOCULAR LENS IMPLANT Bilateral    CERVICAL SPINE SURGERY     Cadaver Bone Placed   COLONOSCOPY     COLONOSCOPY N/A 06/14/2023   Procedure: COLONOSCOPY;  Surgeon: Leigh Elspeth SQUIBB, MD;  Location: [facility] ENDOSCOPY;  Service: Gastroenterology;  Laterality: N/A;   CYSTOSCOPY/URETEROSCOPY/HOLMIUM LASER/STENT PLACEMENT Left 09/19/2020   Procedure: CYSTOSCOPY RETROGRADE PYELOGRAM URETEROSCOPY/HOLMIUM LASER/STENT PLACEMENT;  Surgeon: Watt Rush, MD;  Location: [facility] ORS;  Service: Urology;  Laterality: Left;   EYE  SURGERY Bilateral    Eye Lid lifting   LUMBAR SPINE SURGERY     Ruptured Disc   MAZE N/A 05/27/2023   Procedure: MAZE;  Surgeon: Maryjane Mt, MD;  Location: [facility] OR;  Service: Open Heart Surgery;  Laterality: N/A;   MITRAL VALVE REPAIR N/A 05/27/2023   Procedure: MITRAL VALVE REPAIR USING MEDTRONIC SIMULUS SEMI-RIDGID ANNULOPLASTY BAND (MVR);  Surgeon: Maryjane Mt, MD;  Location: [facility] OR;  Service: Open Heart Surgery;  Laterality: N/A;   PACEMAKER IMPLANT N/A 06/03/2023   Procedure: PACEMAKER IMPLANT;  Surgeon: Waddell Danelle ORN, MD;  Location: [facility] INVASIVE CV LAB;  Service: Cardiovascular;  Laterality: N/A;   PROSTATECTOMY  2005   RIGHT/LEFT HEART CATH AND CORONARY ANGIOGRAPHY N/A 02/27/2023   Procedure: RIGHT/LEFT HEART CATH AND CORONARY ANGIOGRAPHY;  Surgeon: [name] Lurena POUR, MD;  Location: [facility] INVASIVE CV LAB;  Service: Cardiovascular;  Laterality: N/A;   TEE WITHOUT CARDIOVERSION N/A 02/07/2023   Procedure: TRANSESOPHAGEAL ECHOCARDIOGRAM;  Surgeon: Francyne Headland, MD;  Location: [facility] INVASIVE CV LAB;  Service: Cardiovascular;  Laterality: N/A;   TEE WITHOUT CARDIOVERSION N/A 05/27/2023   Procedure: TRANSESOPHAGEAL ECHOCARDIOGRAM (TEE);  Surgeon: Maryjane Mt, MD;  Location: [facility] OR;  Service: Open Heart Surgery;  Laterality: N/A;   TRANSESOPHAGEAL ECHOCARDIOGRAM (CATH LAB) N/A 06/03/2023   Procedure: TRANSESOPHAGEAL ECHOCARDIOGRAM;  Surgeon: Lonni Slain, MD;  Location: [facility] INVASIVE CV LAB;  Service: Cardiovascular;  Laterality: N/A;   TRICUSPID VALVE REPLACEMENT N/A 05/27/2023   Procedure: TRICUSPID  VALVE REPAIR USING EDWARDS MC3 TRICUSPID ANNULOPLASTY RING;  Surgeon: Maryjane Mt, MD;  Location: [facility] OR;  Service: Open Heart Surgery;  Laterality: N/A;    Prior to Admission medications   Medication Sig Start Date End Date Taking? Authorizing Provider  atorvastatin  (LIPITOR) 40 MG tablet Take 1 tablet (40 mg total) by mouth daily. 11/07/23  Yes Thukkani, Arun K, MD  Fe Fum-Vit  C-Vit B12-FA (TRIGELS-F FORTE) CAPS capsule Take 1 capsule by mouth daily. 06/24/23  Yes Angiulli, Toribio PARAS, PA-C  metoprolol  succinate (TOPROL  XL) 25 MG 24 hr tablet Take 1 tablet (25 mg total) by mouth daily. 10/02/23  Yes Thukkani, Arun K, MD  potassium chloride  SA (KLOR-CON  M20) 20 MEQ tablet Take 1 tablet (20 mEq total) by mouth daily as needed. 07/25/23  Yes Colletta Manuelita Garre, PA-C  Vitamin D , Ergocalciferol , (DRISDOL ) 1.25 MG (50000 UNIT) CAPS capsule Take 1 capsule (50,000 Units total) by mouth every 7 (seven) days. 06/30/23  Yes Angiulli, Toribio PARAS, PA-C  acetaminophen  (TYLENOL ) 325 MG tablet Take 1-2 tablets (325-650 mg total) by mouth every 4 (four) hours as needed for mild pain (pain score 1-3). 06/23/23   Angiulli, Toribio PARAS, PA-C  apixaban  (ELIQUIS ) 5 MG TABS tablet Take 1 tablet (5 mg total) by mouth 2 (two) times daily. 07/25/23   Colletta Manuelita Garre, PA-C  furosemide  (LASIX ) 20 MG tablet Take 1 tablet (20 mg total) by mouth daily as needed. 07/25/23 11/06/23  Colletta Manuelita Garre, PA-C  pantoprazole  (PROTONIX ) 40 MG tablet Take 1 tablet (40 mg total) by mouth 2 (two) times daily. 06/24/23   Angiulli, Toribio PARAS, PA-C    Current Facility-Administered Medications  Medication Dose Route Frequency Provider Last Rate Last Admin   0.9 %  sodium chloride  infusion    Continuous PRN Kayston Jodoin V, MD 10 mL/hr at 12/25/23 1041 500 mL at 12/25/23 1041    Allergies as of 11/24/2023   (No Known Allergies)    Family History  Problem Relation Age of Onset   Heart disease Mother 45       died   Prostate cancer Father        alive   Testicular cancer Brother        died    Social History   Socioeconomic History   Marital status: Married    Spouse name: Not on file   Number of children: 2   Years of education: Not on file   Highest education level: Not on file  Occupational History   Occupation: Personnel officer: [employer]  Tobacco Use   Smoking status: Never    Smokeless tobacco: Never  Vaping Use   Vaping status: Never Used  Substance and Sexual Activity   Alcohol use: No   Drug use: No   Sexual activity: Yes  Other Topics Concern   Not on file  Social History Narrative   Not on file   Social Drivers of Health   Financial Resource Strain: Low Risk  (06/18/2023)   Overall Financial Resource Strain (CARDIA)    Difficulty of Paying Living Expenses: Not very hard  Food Insecurity: No Food Insecurity (05/28/2023)   Hunger Vital Sign    Worried About Running Out of Food in the Last Year: Never true    Ran Out of Food in the Last Year: Never true  Transportation Needs: No Transportation Needs (06/18/2023)   PRAPARE - Administrator, Civil Service (Medical): No  Lack of Transportation (Non-Medical): No  Physical Activity: Insufficiently Active (10/31/2022)   Exercise Vital Sign    Days of Exercise per Week: 3 days    Minutes of Exercise per Session: 20 min  Stress: No Stress Concern Present (10/31/2022)   [employer] of Occupational Health - Occupational Stress Questionnaire    Feeling of Stress : Not at all  Social Connections: Socially Integrated (05/28/2023)   Social Connection and Isolation Panel    Frequency of Communication with Friends and Family: More than three times a week    Frequency of Social Gatherings with Friends and Family: Three times a week    Attends Religious Services: More than 4 times per year    Active Member of Clubs or Organizations: Yes    Attends Banker Meetings: 1 to 4 times per year    Marital Status: Married  Catering manager Violence: Not At Risk (05/28/2023)   Humiliation, Afraid, Rape, and Kick questionnaire    Fear of Current or Ex-Partner: No    Emotionally Abused: No    Physically Abused: No    Sexually Abused: No    Review of Systems:  All other review of systems negative except as mentioned in the HPI.  Physical Exam: Vital signs in last 24 hours: BP (!)  155/76   Pulse 70   Temp 98.2 F (36.8 C) (Temporal)   Resp 11   Ht 5' 10 (1.778 m)   Wt 85.7 kg   SpO2 99%   BMI 27.12 kg/m  General:   Alert, NAD Lungs:  Clear .   Heart:  Regular rate and rhythm Abdomen:  Soft, nontender and nondistended. Neuro/Psych:  Alert and cooperative. Normal mood and affect. A and O x 3  Reviewed labs, radiology imaging, old records and pertinent past GI work up   K. Veena Raynetta Osterloh , MD [phone]

## 2023-12-25 NOTE — Op Note (Signed)
 [facility] Patient Name: [name] [name] Procedure Date: 12/25/2023 MRN: [id number] Attending MD: Gustav ALONSO Mcgee , MD, [id number] Date of Birth: 1946-02-01 CSN: [id number] Age: 77 Admit Type: Outpatient Procedure:                Colonoscopy Indications:              Excision of colonic polyp Providers:                Gustav ALONSO Mcgee, MD, Hoy Penner, RN, Corene Southgate, Technician Referring MD:              Medicines:                Monitored Anesthesia Care Complications:            No immediate complications. Estimated Blood Loss:     Estimated blood loss was minimal. Procedure:                Pre-Anesthesia Assessment:                           - Prior to the procedure, a History and Physical                            was performed, and patient medications and                            allergies were reviewed. The patient's tolerance of                            previous anesthesia was also reviewed. The risks                            and benefits of the procedure and the sedation                            options and risks were discussed with the patient.                            All questions were answered, and informed consent                            was obtained. Prior Anticoagulants: The patient                            last took Eliquis  (apixaban ) 2 days prior to the                            procedure. ASA Grade Assessment: III - A patient                            with severe systemic disease. After reviewing the  risks and benefits, the patient was deemed in                            satisfactory condition to undergo the procedure.                           After obtaining informed consent, the colonoscope                            was passed under direct vision. Throughout the                            procedure, the patient's blood pressure, pulse, and                             oxygen  saturations were monitored continuously. The                            [id number]-[id number] ([id number]) Olympus Colonoscope was                            introduced through the anus and advanced to the the                            cecum, identified by appendiceal orifice and                            ileocecal valve. The colonoscopy was performed                            without difficulty. The patient tolerated the                            procedure well. The quality of the bowel                            preparation was good. The ileocecal valve,                            appendiceal orifice, and rectum were photographed. Scope In: 11:50:43 AM Scope Out: 12:18:49 PM Scope Withdrawal Time: 0 hours 23 minutes 4 seconds  Total Procedure Duration: 0 hours 28 minutes 6 seconds  Findings:      The perianal and digital rectal examinations were normal.      A 4 mm polyp was found in the ascending colon. The polyp was sessile.       The polyp was removed with a cold snare. Resection and retrieval were       complete.      A few large-mouthed, medium-mouthed and small-mouthed diverticula were       found in the sigmoid colon and ascending colon.      A large polyp ~5-6cm in size was found in the transverse colon. The       polyp was semi-pedunculated. The polyp was removed with a hot snare.       Resection and retrieval were  complete. To close a defect after       polypectomy, three hemostatic clips were successfully placed. There was       no bleeding at the end of the procedure.      Non-bleeding external and internal hemorrhoids were found during       retroflexion. The hemorrhoids were medium-sized. Impression:               - One 4 mm polyp in the ascending colon, removed                            with a cold snare. Resected and retrieved.                           - Diverticulosis in the sigmoid colon and in the                            ascending colon.                            - One large polyp in the transverse colon, removed                            with a hot snare. Resected and retrieved. Clips                            were placed.                           - Non-bleeding external and internal hemorrhoids. Moderate Sedation:      Not Applicable - Patient had care per Anesthesia. Recommendation:           - Resume previous diet.                           - Continue present medications.                           - Await pathology results.                           - Repeat colonoscopy in 6 months for surveillance                            based on pathology results.                           - The patient is not currently taking anticoagulant                            or antiplatelet agents.                           - Resume Eliquis  (apixaban ) at prior dose in 2                            days. Refer to managing physician  for further                            adjustment of therapy. Procedure Code(s):        --- Professional ---                           [phone], Colonoscopy, flexible; with removal of                            tumor(s), polyp(s), or other lesion(s) by snare                            technique Diagnosis Code(s):        --- Professional ---                           D12.2, Benign neoplasm of ascending colon                           D12.3, Benign neoplasm of transverse colon (hepatic                            flexure or splenic flexure)                           K64.8, Other hemorrhoids                           K63.5, Polyp of colon CPT copyright 2022 American Medical Association. All rights reserved. The codes documented in this report are preliminary and upon coder review may  be revised to meet current compliance requirements. Liesl Simons V. Cainan Trull, MD 12/25/2023 12:31:41 PM This report has been signed electronically. Number of Addenda: 0

## 2023-12-25 NOTE — Anesthesia Postprocedure Evaluation (Signed)
 Anesthesia Post Note  Patient: [name] [name]  Procedure(s) Performed: COLONOSCOPY     Patient location during evaluation: PACU Anesthesia Type: MAC Level of consciousness: awake and alert Pain management: pain level controlled Vital Signs Assessment: post-procedure vital signs reviewed and stable Respiratory status: spontaneous breathing, nonlabored ventilation, respiratory function stable and patient connected to nasal cannula oxygen  Cardiovascular status: stable and blood pressure returned to baseline Postop Assessment: no apparent nausea or vomiting Anesthetic complications: no   No notable events documented.  Last Vitals:  Vitals:   12/25/23 1240 12/25/23 1245  BP: 128/68 135/72  Pulse: 70 70  Resp: (!) 21 13  Temp:    SpO2: 100% 100%    Last Pain:  Vitals:   12/25/23 1245  TempSrc:   PainSc: 0-No pain                 Cordella P Darnel Mchan

## 2023-12-25 NOTE — Discharge Instructions (Addendum)
 YOU MAY RESTART YOUR ELIQUIS  SATURDAY 12/27/23  YOU HAD AN ENDOSCOPIC PROCEDURE TODAY: Refer to the procedure report and other information in the discharge instructions given to you for any specific questions about what was found during the examination. If this information does not answer your questions, please call [place] office at [phone] to clarify.   YOU SHOULD EXPECT: Some feelings of bloating in the abdomen. Passage of more gas than usual. Walking can help get rid of the air that was put into your GI tract during the procedure and reduce the bloating. If you had a lower endoscopy (such as a colonoscopy or flexible sigmoidoscopy) you may notice spotting of blood in your stool or on the toilet paper. Some abdominal soreness may be present for a day or two, also.  DIET: Your first meal following the procedure should be a light meal and then it is ok to progress to your normal diet. A half-sandwich or bowl of soup is an example of a good first meal. Heavy or fried foods are harder to digest and may make you feel nauseous or bloated. Drink plenty of fluids but you should avoid alcoholic beverages for 24 hours. If you had a esophageal dilation, please see attached instructions for diet.    ACTIVITY: Your care partner should take you home directly after the procedure. You should plan to take it easy, moving slowly for the rest of the day. You can resume normal activity the day after the procedure however YOU SHOULD NOT DRIVE, use power tools, machinery or perform tasks that involve climbing or major physical exertion for 24 hours (because of the sedation medicines used during the test).   SYMPTOMS TO REPORT IMMEDIATELY: A gastroenterologist can be reached at any hour. Please call [phone]  for any of the following symptoms:  Following lower endoscopy (colonoscopy, flexible sigmoidoscopy) Excessive amounts of blood in the stool  Significant tenderness, worsening of abdominal pains  Swelling of  the abdomen that is new, acute  Fever of 100 or higher    FOLLOW UP:  If any biopsies were taken you will be contacted by phone or by letter within the next 1-3 weeks. Call [phone]  if you have not heard about the biopsies in 3 weeks.  Please also call with any specific questions about appointments or follow up tests.

## 2023-12-25 NOTE — Anesthesia Preprocedure Evaluation (Signed)
 Anesthesia Evaluation  Patient identified by MRN, date of birth, ID band Patient awake    Reviewed: Allergy & Precautions, NPO status , Patient's Chart, lab work & pertinent test results  Airway Mallampati: II  TM Distance: >3 FB Neck ROM: Full    Dental no notable dental hx.    Pulmonary neg pulmonary ROS   Pulmonary exam normal        Cardiovascular hypertension, Pt. on medications and Pt. on home beta blockers + dysrhythmias Atrial Fibrillation + Valvular Problems/Murmurs (S/P MVR, TV repair 2025) MR  Rhythm:Regular Rate:Normal     Neuro/Psych CVA  negative psych ROS   GI/Hepatic Neg liver ROS,GERD  Medicated,,Colon polyps    Endo/Other  negative endocrine ROS    Renal/GU Renal disease  negative genitourinary   Musculoskeletal  (+) Arthritis , Osteoarthritis,    Abdominal Normal abdominal exam  (+)   Peds  Hematology  (+) Blood dyscrasia, anemia   Anesthesia Other Findings   Reproductive/Obstetrics                              Anesthesia Physical Anesthesia Plan  ASA: 3  Anesthesia Plan: MAC   Post-op Pain Management:    Induction: Intravenous  PONV Risk Score and Plan: 1 and Propofol  infusion and Treatment may vary due to age or medical condition  Airway Management Planned: Simple Face Mask and Nasal Cannula  Additional Equipment: None  Intra-op Plan:   Post-operative Plan:   Informed Consent: I have reviewed the patients History and Physical, chart, labs and discussed the procedure including the risks, benefits and alternatives for the proposed anesthesia with the patient or authorized representative who has indicated his/her understanding and acceptance.     Dental advisory given  Plan Discussed with: CRNA  Anesthesia Plan Comments:         Anesthesia Quick Evaluation

## 2023-12-25 NOTE — Transfer of Care (Signed)
 Immediate Anesthesia Transfer of Care Note  Patient: [name] [name]  Procedure(s) Performed: COLONOSCOPY RESECTION, MUCOSAL LESION, GI TRACT, ENDOSCOPIC  Patient Location: PACU + Endoscopy  Anesthesia Type:MAC  Level of Consciousness: drowsy and patient cooperative  Airway & Oxygen  Therapy: Patient Spontanous Breathing and Patient connected to face mask oxygen   Post-op Assessment: Report given to RN and Post -op Vital signs reviewed and stable  Post vital signs: Reviewed and stable  Last Vitals:  Vitals Value Taken Time  BP 94/58 12/25/23 1225  Temp    Pulse 70 12/25/23 12:25  Resp 21 12/25/23 12:25  SpO2 100 % 12/25/23 12:25  Vitals shown include unfiled device data.  Last Pain:  Vitals:   12/25/23 1028  TempSrc: Temporal  PainSc: 0-No pain         Complications: No notable events documented.

## 2023-12-26 ENCOUNTER — Encounter (HOSPITAL_COMMUNITY): Payer: Self-pay | Admitting: Gastroenterology

## 2023-12-26 LAB — SURGICAL PATHOLOGY

## 2023-12-29 ENCOUNTER — Encounter (HOSPITAL_COMMUNITY): Attending: Urology

## 2023-12-29 ENCOUNTER — Other Ambulatory Visit: Payer: Self-pay

## 2023-12-29 ENCOUNTER — Encounter (HOSPITAL_COMMUNITY): Payer: Self-pay | Admitting: Urology

## 2023-12-29 DIAGNOSIS — N200 Calculus of kidney: Secondary | ICD-10-CM | POA: Diagnosis not present

## 2023-12-29 DIAGNOSIS — R311 Benign essential microscopic hematuria: Secondary | ICD-10-CM | POA: Diagnosis not present

## 2023-12-29 NOTE — Progress Notes (Signed)
 SURGICAL WAITING ROOM VISITATION  Patients having surgery or a procedure may have no more than 2 support people in the waiting area - these visitors may rotate.    Children under the age of 53 must have an adult with them who is not the patient.  Visitors with respiratory illnesses are discouraged from visiting and should remain at home.  If the patient needs to stay at the hospital during part of their recovery, the visitor guidelines for inpatient rooms apply. Pre-op nurse will coordinate an appropriate time for 1 support person to accompany patient in pre-op.  This support person may not rotate.    Please refer to the [facility] website for the visitor guidelines for Inpatients (after your surgery is over and you are in a regular room).       Your procedure is scheduled on:  01/13/2024    Report to [facility] Main Entrance    Report to admitting at  0515 AM   Call this number if you have problems the morning of surgery [phone]   Do not eat food  or drink liquids :After Midnight.                          If you have questions, please contact your surgeon's office.   F     Oral Hygiene is also important to reduce your risk of infection.                                    Remember - BRUSH YOUR TEETH THE MORNING OF SURGERY WITH YOUR REGULAR TOOTHPASTE  DENTURES WILL BE REMOVED PRIOR TO SURGERY PLEASE DO NOT APPLY Poly grip OR ADHESIVES!!!   Do NOT smoke after Midnight   Stop all vitamins and herbal supplements 7 days before surgery.   Take these medicines the morning of surgery with A SIP OF WATER :  toprol , atorvastatin    DO NOT TAKE ANY ORAL DIABETIC MEDICATIONS DAY OF YOUR SURGERY  Bring CPAP mask and tubing day of surgery.                              You may not have any metal on your body including hair pins, jewelry, and body piercing             Do not wear make-up, lotions, powders, perfumes/cologne, or deodorant  Do not wear nail polish  including gel and S&S, artificial/acrylic nails, or any other type of covering on natural nails including finger and toenails. If you have artificial nails, gel coating, etc. that needs to be removed by a nail salon please have this removed prior to surgery or surgery may need to be canceled/ delayed if the surgeon/ anesthesia feels like they are unable to be safely monitored.   Do not shave  48 hours prior to surgery.               Men may shave face and neck.   Do not bring valuables to the hospital. [facility] IS NOT             RESPONSIBLE   FOR VALUABLES.   Contacts, glasses, dentures or bridgework may not be worn into surgery.   Bring small overnight bag day of surgery.   DO NOT BRING YOUR HOME MEDICATIONS TO THE HOSPITAL. PHARMACY WILL  DISPENSE MEDICATIONS LISTED ON YOUR MEDICATION LIST TO YOU DURING YOUR ADMISSION IN THE HOSPITAL!    Patients discharged on the day of surgery will not be allowed to drive home.  Someone NEEDS to stay with you for the first 24 hours after anesthesia.   Special Instructions: Bring a copy of your healthcare power of attorney and living will documents the day of surgery if you haven't scanned them before.              Please read over the following fact sheets you were given: IF YOU HAVE QUESTIONS ABOUT YOUR PRE-OP INSTRUCTIONS PLEASE CALL [phone].   If you received a COVID test during your pre-op visit  it is requested that you wear a mask when out in public, stay away from anyone that may not be feeling well and notify your surgeon if you develop symptoms. If you test positive for Covid or have been in contact with anyone that has tested positive in the last 10 days please notify you surgeon.    [facility] - Preparing for Surgery Before surgery, you can play an important role.  Because skin is not sterile, your skin needs to be as free of germs as possible.  You can reduce the number of germs on your skin by washing with CHG (chlorahexidine  gluconate) soap before surgery.  CHG is an antiseptic cleaner which kills germs and bonds with the skin to continue killing germs even after washing. Please DO NOT use if you have an allergy to CHG or antibacterial soaps.  If your skin becomes reddened/irritated stop using the CHG and inform your nurse when you arrive at Short Stay. Do not shave (including legs and underarms) for at least 48 hours prior to the first CHG shower.  You may shave your face/neck. Please follow these instructions carefully:  1.  Shower with CHG Soap the night before surgery and the  morning of Surgery.  2.  If you choose to wash your hair, wash your hair first as usual with your  normal  shampoo.  3.  After you shampoo, rinse your hair and body thoroughly to remove the  shampoo.                           4.  Use CHG as you would any other liquid soap.  You can apply chg directly  to the skin and wash                       Gently with a scrungie or clean washcloth.  5.  Apply the CHG Soap to your body ONLY FROM THE NECK DOWN.   Do not use on face/ open                           Wound or open sores. Avoid contact with eyes, ears mouth and genitals (private parts).                       Wash face,  Genitals (private parts) with your normal soap.             6.  Wash thoroughly, paying special attention to the area where your surgery  will be performed.  7.  Thoroughly rinse your body with warm water  from the neck down.  8.  DO NOT shower/wash with your normal soap after using and  rinsing off  the CHG Soap.                9.  Pat yourself dry with a clean towel.            10.  Wear clean pajamas.            11.  Place clean sheets on your bed the night of your first shower and do not  sleep with pets. Day of Surgery : Do not apply any lotions/deodorants the morning of surgery.  Please wear clean clothes to the hospital/surgery center.  FAILURE TO FOLLOW THESE INSTRUCTIONS MAY RESULT IN THE CANCELLATION OF YOUR  SURGERY PATIENT SIGNATURE_________________________________  NURSE SIGNATURE__________________________________  ________________________________________________________________________

## 2023-12-29 NOTE — Progress Notes (Addendum)
 Anesthesia Review:  PCP: [name] LVO 04/07/23  Cardiologist : [name]   LOV 10/02/23 clearance [name] [name] 11/06/23   PPM/ ICD: pacemaker  Device Orders: 10/20/23, Device orders requested on 12/29/23  Rep Notified:  Chest x-ray : 07/24/23- 2 view  EKG : 10/02/23  Echo : 07/24/23  Stress test: Cardiac Cath :  02/27/23   Activity level:  Sleep Study/ CPAP : Fasting Blood Sugar :      / Checks Blood Sugar -- times a day:     Eliquis  - at time of phone call on 12/29/23 pt has not yet received instructions has appt with ? [facility] at 1045am on 12/29/23.  PT aware to ask Dr Watt at that time.    Blood Thinner/ Instructions [name]: ASA / Instructions/ Last Dose :    Colonoscopy - 12/25/23  [facility]- 05/2023    Called pt at 0900am when had not arrived.  PT thought appt was at 1045 this am .  MEd hx and preop instrucitons completed via phone call on 12/29/23 at 0900am.  PT to come in on 12/30/23 at 0800am for labs and to pick up nistructions and soap.    PT came in on 12/30/2023 to have labs done.

## 2023-12-30 ENCOUNTER — Encounter (HOSPITAL_COMMUNITY)
Admission: RE | Admit: 2023-12-30 | Discharge: 2023-12-30 | Disposition: A | Discharge status: Home / Self Care | Source: Ambulatory Visit | Attending: Urology | Admitting: Urology

## 2023-12-30 VITALS — BP 139/92 | HR 70 | Temp 97.9°F | Resp 18 | Ht 70.0 in | Wt 190.2 lb

## 2023-12-30 DIAGNOSIS — I495 Sick sinus syndrome: Secondary | ICD-10-CM | POA: Diagnosis not present

## 2023-12-30 DIAGNOSIS — I4891 Unspecified atrial fibrillation: Secondary | ICD-10-CM | POA: Diagnosis not present

## 2023-12-30 DIAGNOSIS — Z01818 Encounter for other preprocedural examination: Secondary | ICD-10-CM

## 2023-12-30 DIAGNOSIS — Z8673 Personal history of transient ischemic attack (TIA), and cerebral infarction without residual deficits: Secondary | ICD-10-CM | POA: Insufficient documentation

## 2023-12-30 DIAGNOSIS — I358 Other nonrheumatic aortic valve disorders: Secondary | ICD-10-CM | POA: Diagnosis not present

## 2023-12-30 DIAGNOSIS — I340 Nonrheumatic mitral (valve) insufficiency: Secondary | ICD-10-CM | POA: Insufficient documentation

## 2023-12-30 DIAGNOSIS — N200 Calculus of kidney: Secondary | ICD-10-CM | POA: Insufficient documentation

## 2023-12-30 DIAGNOSIS — I119 Hypertensive heart disease without heart failure: Secondary | ICD-10-CM | POA: Diagnosis not present

## 2023-12-30 DIAGNOSIS — I371 Nonrheumatic pulmonary valve insufficiency: Secondary | ICD-10-CM | POA: Diagnosis not present

## 2023-12-30 DIAGNOSIS — Z952 Presence of prosthetic heart valve: Secondary | ICD-10-CM | POA: Insufficient documentation

## 2023-12-30 DIAGNOSIS — I2510 Atherosclerotic heart disease of native coronary artery without angina pectoris: Secondary | ICD-10-CM | POA: Diagnosis not present

## 2023-12-30 DIAGNOSIS — I3481 Nonrheumatic mitral (valve) annulus calcification: Secondary | ICD-10-CM | POA: Diagnosis not present

## 2023-12-30 DIAGNOSIS — I7121 Aneurysm of the ascending aorta, without rupture: Secondary | ICD-10-CM | POA: Diagnosis not present

## 2023-12-30 DIAGNOSIS — Z950 Presence of cardiac pacemaker: Secondary | ICD-10-CM | POA: Insufficient documentation

## 2023-12-30 DIAGNOSIS — Z01812 Encounter for preprocedural laboratory examination: Secondary | ICD-10-CM | POA: Insufficient documentation

## 2023-12-30 LAB — BASIC METABOLIC PANEL WITH GFR
Anion gap: 13 (ref 5–15)
BUN: 19 mg/dL (ref 8–23)
CO2: 22 mmol/L (ref 22–32)
Calcium: 8.9 mg/dL (ref 8.9–10.3)
Chloride: 107 mmol/L (ref 98–111)
Creatinine, Ser: 1.08 mg/dL (ref 0.61–1.24)
GFR, Estimated: >60 mL/min (ref >60)
Glucose, Bld: 98 mg/dL (ref 70–99)
Potassium: 3.8 mmol/L (ref 3.5–5.1)
Sodium: 141 mmol/L (ref 135–145)

## 2023-12-30 LAB — CBC
HCT: 41 % (ref 39.0–52.0)
Hemoglobin: 12.2 g/dL — ABNORMAL LOW (ref 13.0–17.0)
MCH: 28.6 pg (ref 26.0–34.0)
MCHC: 29.8 g/dL — ABNORMAL LOW (ref 30.0–36.0)
MCV: 96 fL (ref 80.0–100.0)
Platelets: 224 K/uL (ref 150–400)
RBC: 4.27 MIL/uL (ref 4.22–5.81)
RDW: 14.3 % (ref 11.5–15.5)
WBC: 5.3 K/uL (ref 4.0–10.5)
nRBC: 0 % (ref 0.0–0.2)

## 2023-12-31 NOTE — Progress Notes (Addendum)
 Anesthesia Chart Review   Case: [id number] Date/Time: 01/13/24 0715   Procedure: CYSTOSCOPY/URETEROSCOPY/HOLMIUM LASER/STENT PLACEMENT (Left)   Anesthesia type: General   Diagnosis: Calculus of kidney [N20.0]   Pre-op diagnosis: LEFT RENAL STONE   Location: [facility] PROC[facility] / [facility]   Surgeons: Watt Rush, MD       DISCUSSION:78 y.o. never smoker with h/o HTN, CVA, tachy-brady syndrome s/p PPM 05/2023, atrial fibrillation s/p MAZE + LAA closure with Atriclip 05/2023, nonobstructive CAD on cath, s/p MVR 05/27/2023, prostate cancer, left renal stone scheduled for above procedure 01/13/2024 with Dr. Rush Watt.   S/p colonoscopy 12/25/2023 with no anesthesia complications noted.   Per cardiology preoperative evaluation 11/10/2023, Chart reviewed as part of pre-operative protocol coverage. Given past medical history and time since last visit, based on ACC/AHA guidelines, [name] [name] would be at acceptable risk for the planned procedure without further cardiovascular testing.    Patient with diagnosis of A Fib on Eliquis  for anticoagulation.    Per office protocol, patient can hold Eliquis  for 2 days prior to procedure.  Device orders in 01/07/24 progress note. Procedure may interfere, magnet should be placed over device.  VS: BP (!) 139/92   Pulse 70   Temp 36.6 C (Oral)   Resp 18   Ht 5' 10 (1.778 m)   Wt 86.3 kg   SpO2 100%   BMI 27.30 kg/m   PROVIDERS: Duanne Butler DASEN, MD is PCP   Primary Cardiologist: Arun K Thukkani, MD  LABS: Labs reviewed: Acceptable for surgery. (all labs ordered are listed, but only abnormal results are displayed)  Labs Reviewed  CBC - Abnormal; Notable for the following components:      Result Value   Hemoglobin 12.2 (*)    MCHC 29.8 (*)    All other components within normal limits  BASIC METABOLIC PANEL WITH GFR     IMAGES:   EKG:   CV: Echo 07/23/2023  1. Left ventricular ejection fraction, by estimation, is 55 to 60%. Left   ventricular ejection fraction by 3D volume is 57 %. The left ventricle has  normal function. The left ventricle has no regional wall motion  abnormalities. There is mild concentric  left ventricular hypertrophy. Left ventricular diastolic parameters are  consistent with Grade III diastolic dysfunction (restrictive).   2. Right ventricular systolic function is normal. The right ventricular  size is mildly enlarged. Tricuspid regurgitation signal is inadequate for  assessing PA pressure.   3. Left atrial size was moderately dilated.   4. Right atrial size was moderately dilated.   5. The mitral valve is degenerative. Mild mitral valve regurgitation. No  evidence of mitral stenosis. The mean mitral valve gradient is 2.0 mmHg.  Moderate mitral annular calcification. There is a prosthetic annuloplasty  ring present in the mitral  position.   6. The tricuspid valve is has been repaired/replaced. The tricuspid valve  is status post repair with an annuloplasty ring.   7. The aortic valve is tricuspid. Aortic valve regurgitation is trivial.  Aortic valve sclerosis/calcification is present, without any evidence of  aortic stenosis. Aortic valve area, by VTI measures 2.30 cm. Aortic valve  mean gradient measures 5.0 mmHg.   Aortic valve Vmax measures 1.52 m/s.   8. Pulmonic valve regurgitation is moderate.   9. Aortic dilatation noted. Aneurysm of the ascending aorta, measuring 45  mm. There is mild dilatation of the aortic root, measuring 41 mm.  10. The inferior vena cava is dilated in size  with >50% respiratory  variability, suggesting right atrial pressure of 8 mmHg.   Comparison(s): EF 35%, RVSP 38.9 mmHg, asc aorta 43 mm, moderate PI.   Cardiac Cath 02/27/2023   Prox LAD to Mid LAD lesion is 30% stenosed.   1.  Mild nonobstructive coronary artery disease. 2.  Fick cardiac output of 6.1 L/min and Fick cardiac index of 2.9 L/min/m with the following hemodynamics:             Right  atrial pressure mean of 13 mmHg             Right ventricular pressure 35/3 with end-diastolic pressure of 13 mmHg             Wedge pressure mean of 20 mmHg with V waves to 25 mmHg             Pulmonary artery pressure 38/21 with a mean of 29 mmHg             PVR of 0.66 Woods units             PA pulsatility index of 1.3 3.  LVEDP of 23 mmHg   Recommendation: Cardiothoracic surgical opinion regarding mitral and tricuspid valvular disease.  Start lasix  20mg  daily with BMP in one week. Past Medical History:  Diagnosis Date   Coronary artery disease    CVA (cerebral infarction) 07/14/2009   TIA   Dysrhythmia    A. Fib   Heart murmur    History of kidney stones    Hyperlipidemia    Hypertension    Nephrolithiasis    Paroxysmal atrial fibrillation (HCC)    Paroxysmal atrial flutter (HCC)    a. s/p ablation 2012.   Prostate cancer [facility])    Stroke [facility])    TIA - no deficits    Past Surgical History:  Procedure Laterality Date   APPENDECTOMY  1967   BONE BIOPSY  06/14/2023   Procedure: BIOPSY,Colon;  Surgeon: Leigh Elspeth SQUIBB, MD;  Location: [facility] ENDOSCOPY;  Service: Gastroenterology;;   CARDIOVERSION N/A 06/03/2023   Procedure: CARDIOVERSION;  Surgeon: Lonni Slain, MD;  Location: [facility] INVASIVE CV LAB;  Service: Cardiovascular;  Laterality: N/A;   CATARACT EXTRACTION W/ INTRAOCULAR LENS IMPLANT Bilateral    CERVICAL SPINE SURGERY     Cadaver Bone Placed   COLONOSCOPY     COLONOSCOPY N/A 06/14/2023   Procedure: COLONOSCOPY;  Surgeon: Leigh Elspeth SQUIBB, MD;  Location: [facility] ENDOSCOPY;  Service: Gastroenterology;  Laterality: N/A;   COLONOSCOPY N/A 12/25/2023   Procedure: COLONOSCOPY;  Surgeon: Shila Gustav GAILS, MD;  Location: [facility] ENDOSCOPY;  Service: Gastroenterology;  Laterality: N/A;   CYSTOSCOPY/URETEROSCOPY/HOLMIUM LASER/STENT PLACEMENT Left 09/19/2020   Procedure: CYSTOSCOPY RETROGRADE PYELOGRAM URETEROSCOPY/HOLMIUM LASER/STENT PLACEMENT;  Surgeon: Watt Rush, MD;   Location: [facility] ORS;  Service: Urology;  Laterality: Left;   EYE SURGERY Bilateral    Eye Lid lifting   LUMBAR SPINE SURGERY     Ruptured Disc   MAZE N/A 05/27/2023   Procedure: MAZE;  Surgeon: Maryjane Mt, MD;  Location[facility] OR;  Service: Open Heart Surgery;  Laterality: N/A;   MITRAL VALVE REPAIR N/A 05/27/2023   Procedure: MITRAL VALVE REPAIR USING MEDTRONIC SIMULUS SEMI-RIDGID ANNULOPLASTY BAND (MVR);  Surgeon: Maryjane Mt, MD;  Location: [facility] OR;  Service: Open Heart Surgery;  Laterality: N/A;   PACEMAKER IMPLANT N/A 06/03/2023   Procedure: PACEMAKER IMPLANT;  Surgeon: Waddell Danelle ORN, MD;  Location: [facility] INVASIVE CV LAB;  Service: Cardiovascular;  Laterality: N/A;  PROSTATECTOMY  2005   RIGHT/LEFT HEART CATH AND CORONARY ANGIOGRAPHY N/A 02/27/2023   Procedure: RIGHT/LEFT HEART CATH AND CORONARY ANGIOGRAPHY;  Surgeon: [name] Lurena POUR, MD;  Location: [facility] INVASIVE CV LAB;  Service: Cardiovascular;  Laterality: N/A;   TEE WITHOUT CARDIOVERSION N/A 02/07/2023   Procedure: TRANSESOPHAGEAL ECHOCARDIOGRAM;  Surgeon: Francyne Headland, MD;  Location: [facility] INVASIVE CV LAB;  Service: Cardiovascular;  Laterality: N/A;   TEE WITHOUT CARDIOVERSION N/A 05/27/2023   Procedure: TRANSESOPHAGEAL ECHOCARDIOGRAM (TEE);  Surgeon: Maryjane Mt, MD;  Location: [facility] OR;  Service: Open Heart Surgery;  Laterality: N/A;   TRANSESOPHAGEAL ECHOCARDIOGRAM (CATH LAB) N/A 06/03/2023   Procedure: TRANSESOPHAGEAL ECHOCARDIOGRAM;  Surgeon: Lonni Slain, MD;  Location: [facility] INVASIVE CV LAB;  Service: Cardiovascular;  Laterality: N/A;   TRICUSPID VALVE REPLACEMENT N/A 05/27/2023   Procedure: TRICUSPID VALVE REPAIR USING EDWARDS MC3 TRICUSPID ANNULOPLASTY RING;  Surgeon: Maryjane Mt, MD;  Location: [facility] OR;  Service: Open Heart Surgery;  Laterality: N/A;    MEDICATIONS:  acetaminophen  (TYLENOL ) 325 MG tablet   apixaban  (ELIQUIS ) 5 MG TABS tablet   atorvastatin  (LIPITOR) 40 MG tablet   cefdinir (OMNICEF) 300 MG  capsule   Fe Fum-Vit C-Vit B12-FA (TRIGELS-F FORTE) CAPS capsule   furosemide  (LASIX ) 20 MG tablet   metoprolol  succinate (TOPROL  XL) 25 MG 24 hr tablet   pantoprazole  (PROTONIX ) 40 MG tablet   potassium chloride  SA (KLOR-CON  M20) 20 MEQ tablet   traMADol  (ULTRAM ) 50 MG tablet   Vitamin D , Ergocalciferol , (DRISDOL ) 1.25 MG (50000 UNIT) CAPS capsule   No current facility-administered medications for this encounter.     Harlene Hoots Schryver, PA-C [facility] Pre-Surgical Testing [phone]

## 2024-01-07 ENCOUNTER — Encounter: Payer: Self-pay | Admitting: Internal Medicine

## 2024-01-07 NOTE — Progress Notes (Signed)
 PERIOPERATIVE PRESCRIPTION FOR IMPLANTED CARDIAC DEVICE PROGRAMMING  Patient Information: Name:  [name] [name]  DOB:  11/12/1945  MRN:  [id number]    Planned Procedure:  Cystoscopy with stent placement  Surgeon:  Dr. Norleen Seltzer  Date of Procedure:  01/13/2024  Cautery will be used.  Position during surgery:  unknown   Please send documentation back to:  Darry[facility] Law (Fax # [phone])   Device Information:  Clinic EP Physician:  Danelle Birmingham, MD   Device Type:  Pacemaker Manufacturer and Phone #:  [employer]ott: [phone] Pacemaker Dependent?:  Unknown Date of Last Device Check:  10/20/2023  Normal Device Function?:  Yes.    Electrophysiologist's Recommendations:  Have magnet available. Provide continuous ECG monitoring when magnet is used or reprogramming is to be performed.  Procedure may interfere with device function.  Magnet should be placed over device during procedure.  Per Device Clinic Standing Orders, Almarie ONEIDA Shutter, RN  8:54 AM 01/07/2024

## 2024-01-07 NOTE — Anesthesia Preprocedure Evaluation (Addendum)
 Anesthesia Evaluation  Patient identified by MRN, date of birth, ID band Patient awake    Reviewed: Allergy & Precautions, NPO status , Patient's Chart, lab work & pertinent test results  History of Anesthesia Complications Negative for: history of anesthetic complications  Airway Mallampati: II  TM Distance: >3 FB Neck ROM: Full    Dental no notable dental hx. (+) Teeth Intact, Dental Advisory Given   Pulmonary    Pulmonary exam normal breath sounds clear to auscultation       Cardiovascular hypertension, Pt. on medications and Pt. on home beta blockers + CAD  (-) Past MI Normal cardiovascular exam+ dysrhythmias (on aphixiban) Atrial Fibrillation + pacemaker (tachy-brady syndrome s/p PPM 05/2023) + Valvular Problems/Murmurs (S?P MVR 05/2023)  Rhythm:Regular Rate:Normal  07/2023 TTE  1. Left ventricular ejection fraction, by estimation, is 55 to 60%. Left  ventricular ejection fraction by 3D volume is 57 %. The left ventricle has  normal function. The left ventricle has no regional wall motion  abnormalities. There is mild concentric  left ventricular hypertrophy. Left ventricular diastolic parameters are  consistent with Grade III diastolic dysfunction (restrictive).   2. Right ventricular systolic function is normal. The right ventricular  size is mildly enlarged. Tricuspid regurgitation signal is inadequate for  assessing PA pressure.   3. Left atrial size was moderately dilated.   4. Right atrial size was moderately dilated.   5. The mitral valve is degenerative. Mild mitral valve regurgitation. No  evidence of mitral stenosis. The mean mitral valve gradient is 2.0 mmHg.  Moderate mitral annular calcification. There is a prosthetic annuloplasty  ring present in the mitral  position.   6. The tricuspid valve is has been repaired/replaced. The tricuspid valve  is status post repair with an annuloplasty ring.   7. The aortic  valve is tricuspid. Aortic valve regurgitation is trivial.  Aortic valve sclerosis/calcification is present, without any evidence of  aortic stenosis. Aortic valve area, by VTI measures 2.30 cm. Aortic valve  mean gradient measures 5.0 mmHg.   Aortic valve Vmax measures 1.52 m/s.   8. Pulmonic valve regurgitation is moderate.   9. Aortic dilatation noted. Aneurysm of the ascending aorta, measuring 45  mm. There is mild dilatation of the aortic root, measuring 41 mm.  10. The inferior vena cava is dilated in size with >50% respiratory  variability, suggesting right atrial pressure of 8 mmHg.      Neuro/Psych CVA, No Residual Symptoms    GI/Hepatic ,GERD  Medicated and Controlled,,  Endo/Other  negative endocrine ROS    Renal/GU Renal diseaseLab Results      Component                Value               Date                              K                        3.8                 12/30/2023                CO2                      22  12/30/2023                          CREATININE               1.08                12/30/2023                GFRNONAA                 >60                 12/30/2023                 Musculoskeletal   Abdominal   Peds  Hematology Lab Results      Component                Value               Date                      WBC                      5.3                 12/30/2023                HGB                      12.2 (L)            12/30/2023                HCT                      41.0                12/30/2023                MCV                      96.0                12/30/2023                PLT                      224                 12/30/2023              Anesthesia Other Findings NKDA  Reproductive/Obstetrics                              Anesthesia Physical Anesthesia Plan  ASA: 3  Anesthesia Plan: General   Post-op Pain Management: Tylenol  PO (pre-op)*   Induction: Intravenous  PONV  Risk Score and Plan: 3 and Treatment may vary due to age or medical condition and Ondansetron   Airway Management Planned: LMA  Additional Equipment: None  Intra-op Plan:   Post-operative Plan: Extubation in OR  Informed Consent: I have reviewed the patients History and Physical, chart, labs and discussed the procedure including the risks, benefits and alternatives for the proposed anesthesia with the patient or authorized representative who has indicated his/her understanding and acceptance.  Dental advisory given  Plan Discussed with: CRNA and Surgeon  Anesthesia Plan Comments: (See PAT note 12/30/23)         Anesthesia Quick Evaluation

## 2024-01-09 NOTE — H&P (Addendum)
 1. History of prostate cancer s/p prostatectomy in 2005.   2. SUI.   3. Left renal stone.   4. History of recurrent UTI.   01/05/24: UA looked infected and he was started on Cefdinir but the culture was Mx species.   Will continue med with up coming procedure.   10/26/23: [name] [name] returns today in f/u for his history of stones, UTI's and prostate cancer with post RP SUI. His UA looks infected today. His IPSS is 6 with nocturia x 1. He has an occasional reduced stream. He has had no gross hematuria. He has no dysuria. He has stable SUI with 2ppd required. He has had no flank pain. On KUB today he has a 3cm branched upper pole stone that has increased in size earlier this year.   08/04/2023: [name] [name] returns. He underwent valve replacement, placement of a pacemaker back in February with cardiology. This was done at [facility] but I am having a hard time finding any notes to correlate in epic/Care Everywhere. He tells me he had some issues with bleeding postoperatively and required a colonoscopy to remove some polyps to help address this. We had him on some suppressive antibiotics to the procedure but he has since completed those. His UA today is within normal limits. Continues to be mostly bothered by SUI. He wears 2 depends per day to help manage this. He has tried Kegel exercises in the past but those have not been helpful. Currently denying any dysuria or gross hematuria. Denies any correlating pain/discomfort unilaterally to suggest worsening obstructive uropathy. He asked about timeline for treating his stone burden as well as potential treatment options for the SUI.   05/07/23: [name] [name] returns today in f/u. He had a positive culture for Klebsiella on 04/14/23 prior to heart surgery and was treated. His UA on 04/25/23 still had some pyuria and hematuria but a repeat culture was negative. His surgery is actually scheduled for 05/27/23. He will be having a valve surgery. He had a KUB in December that showed a  stable 6mm mid to upper pole left renal stone. He has a history of SUI following RP in 2005. KUB today shows the stone to be 9.35mm which has grown. He remains on Lasix  and has frequency with that and increased incontinence with urgency and stress. His IPSS is 11 with nocturia x 1. He has a good stream on lasix  but reduced otherwise.    08/09/2022:  Patient presents acutely with complaints of chronic left flank pain. He is on no medications by urology, he is on Xarelto . Patient endorses a 66-month mild, intermittent left lower quadrant/left inguinal region pain, pinpoint. The pain is present on bending, stooping, getting in and out of car. He denies irritative or constitutional symptoms, though he does continue with intermittent mild gross hematuria, persistent over years, and since initiating Xarelto . He continues at baseline voiding function, without frequency, urgency. Nocturia x 1. Patient endorses worsening of his incontinence over the last year, as he now resorts to 4 pads per day, typically wet. Chronic constipation baseline, with last bowel movement 2 to 3 days ago. Of note, he is to go on a cruise in 3 to 4 weeks, and would like to confirm absence of ureteral stone prior to his trip.   04/17/22: 78 y/o M with two year history of worsening SUI, was never fully dry after prostatectomy in 2005; SUI triggers: coughing, sneezing, lifting at work Visual merchandiser). Pt drinks < 30 oz H20 per day, one soda per  day; voids q 3 hrs nocturia > 2. No regular exercise program. PMH: episodic A Fib, cervical spine fusion, lumbar laminectomy.   Assessment: Pt presents with good pelvic floor awareness and coordination, no response to pressure changes. PT to target improved bladder health habits, strength and pressure regulation.  For for a large left renal stone.  12/29/2023: [name] [name] is an 78 year old man who presents for preoperative appointment. He is scheduled to undergo ureteroscopy he was cleared by his cardiac  physician to come off Eliquis . He understands to stop this 2 days prior to his procedure. his urine was sent for culture today. He denies any symptoms including dysuria, gross hematuria, suprapubic pain and pressure. He denies chest pain and shortness of breath.     ALLERGIES: No Allergies    MEDICATIONS: Atorvastatin  Calcium   Eliquis  5 MG Tablet  Metoprolol  Succinate ER  TraMADol  HCl - 50 MG Oral Tablet Oral  Vitamin B 12  Vitamin D      GU PSH: Cysto Bladder Ureth Biopsy - 2022 Cysto Remove Stent FB Sim - 2022 Cystoscopy Fulguration, Right - 2022 Locm 300-399Mg /Ml Iodine,1Ml - 2022 Radical Prostatectomy - about 2005 Ureteroscopic laser litho - 2022       PSH Notes: Surgery Of The Eyelids, Back Surgery, Prostatect Retropubic Radical W/ Bilat Pelv Lymphadenectomy, Kidney Surgery, Appendectomy   NON-GU PSH: Appendectomy - 2008 CABG (coronary artery bypass grafting) Colonoscopy - about 12/22/2023 Revise Eyelid - 2010 Visit Complexity (formerly GPC1X) - 11/05/2023, 08/04/2023, 05/07/2023     GU PMH: Renal calculus, His stone has grown to 3cm from about 17 at the time of his CT in 3/25. He needs to have the stone treated. It is in the upper pole of a non-obstructed system so I think the best approach will be ureteroscopy which may need to be staged. He will need cardiac clearance to come off of Eliquis . I have reviewed the risks of ureteroscopy including bleeding, infection, ureteral injury, need for a stent or secondary procedures, thrombotic events and anesthetic complications. - 11/05/2023, - 08/04/2023, stone is growing. , - 05/07/2023, KUB in a year. , - 03/06/2022, He has some interval growth in the LUP stone. I discussed options and he will return in 3 months with a KUB and if there is further growth, I will treat with URS., - 2023, He appears to have some new stone growth particularly on the left but no symptoms. I will have him return in 3 months with a renal US  and KUB. , - 2023, -  2022, - 2022 Urinary Tract Inf, Unspec site, Urine culture today. I will treat accordingly and recheck preop. - 11/05/2023, - 08/04/2023, I will reculture the urine today and treat as indicated. I will consider suppression as well but will get input from his cardiac surgeon. , - 05/07/2023 Stress Incontinence - 08/04/2023, - 05/07/2023, - 2024, I discussed PT and consideration of a sling or AUS and will have him see PT first. , - 03/06/2022, He continues to require pads. , - 2023, His incontinence has worsened but he is particularly bothered by urgency with nocturia x 3 and small voids. I discussed options and will give him Gemtesa samples to try. I also discussed PT and a return visit to Dr. Gaston to discuss a sling or AUS. , - 2023, He has had some progressive SUI since the cystoscopy. He has no other neurologic symptoms to suggest that the Tarlov cyst on the MRI is the culprit but he would need that to be  evaluated by neurology or neurosurgery. I discuss a course of pelvic floor PT but he wants to hold of on that. He asked about penile clamps and I suggested he try the C3 device. I also mentioned the AUS. , - 2022, He has stable moderate SUI but no irritative symtoms. , - 2022, Male stress incontinence, - 2014 Weak Urinary Stream - 05/07/2023 Abdominal Pain Unspec - 08/09/2022, Left flank pain, - 2015 Gross hematuria - 08/09/2022, (Stable), He has had some recurrent hematuria and if that worsens, I will go ahead and treat the stone. , - 2023, - 2022, - 2022, He has painless gross hematuria with a history of stones. The CT today shows a 4mm left renal stone that is non-obstructing. This could be the source of the bleeding, but I think he needs cystoscopy, It might be worthwhile to do ureteroscopy to remove the stone which would provide the cystoscopy as well. I will discuss it with him further once the culture is back.I will have him hold the Xarelto . I have notified his cardiologist. , - 2022, Gross hematuria, -  2015, Gross Hematuria, - 2014 History of urolithiasis - 08/09/2022, He has no residual stones or obstruction on RUS. I will have him return in 6 months with a KUB. , - 2022, HE has a 4mm left renal stone without obstruction in a calyceal infundibulum., - 2022 Nocturia (Stable) - 2024, - 2023 History of prostate cancer, PSA remains undetectible. - 03/06/2022, He is due for a PSA with Dr. Duanne in the next month or so. , - 2023, His PSA's have been undetectible. , - 2022, His PSA remains low at 0.04 on 06/28/20, - 2022, Prostate Cancer, - 2014 Urinary Urgency, The urgency improved with Gemtesa which he has completed. - 2023, - 2023, Feelings Of Urinary Urgency, - 2014 LLQ pain (Acute), Left - 2018 Prostate Cancer (Chronic) - 2018 Renal and ureteral calculus (Stable, Chronic) - 2018 Adrenal mass Unspec, Adrenal cortical adenoma, unspecified laterality - 2014 ED due to arterial insufficiency, Erectile dysfunction due to arterial insufficiency - 2014 Ureteral calculus, Distal Ureteral Stone On The Left - 2014, Calculus of ureter, - 2014      PMH Notes:  2008-06-13 08:44:54 - Note: Flank Pain Left   NON-GU PMH: Other lack of coordination - 2024 Bacteriuria, He has some bacteria today. I will culture but not treat since he is not symptomatic. - 03/06/2022 Encounter for general adult medical examination without abnormal findings, Encounter for preventive health examination - 2015 Personal history of other diseases of the musculoskeletal system and connective tissue, History of low back pain - 2014 Personal history of other endocrine, nutritional and metabolic disease, History of hypercholesterolemia - 2014 Personal history of transient ischemic attack (TIA), and cerebral infarction without residual deficits, History of transient cerebral ischemia - 2014 Tachycardia, unspecified, Tachycardia - 2014 Unspecified atrial fibrillation, Atrial Fibrillation -  2014 Hypercholesterolemia Hypertension Stroke/TIA    FAMILY HISTORY: Acute Myocardial Infarction - Mother Cancer - Daughter, Sister, Brother Coronary Artery Disease - Mother Death In The Family Mother - Mother Family Health Status Number - Runs In Family Prostate Cancer - Father   SOCIAL HISTORY: Marital Status: Married Preferred Language: English Current Smoking Status: Patient has never smoked.   Tobacco Use Assessment Completed: Used Tobacco in last 30 days? Has never drank.  Does not drink caffeine. Patient's occupation is/was Retired.     Notes: Never A Smoker, Occupation:, Alcohol Use, Tobacco Use, Marital History - Currently Married   REVIEW OF  SYSTEMS:    GU Review Male:   Patient reports get up at night to urinate and leakage of urine. Patient denies frequent urination, hard to postpone urination, burning/ pain with urination, stream starts and stops, trouble starting your stream, have to strain to urinate , erection problems, and penile pain.  Gastrointestinal (Upper):   Patient denies nausea, vomiting, and indigestion/ heartburn.  Gastrointestinal (Lower):   Patient denies diarrhea and constipation.  Constitutional:   Patient denies fever, night sweats, weight loss, and fatigue.  Skin:   Patient denies skin rash/ lesion and itching.  Eyes:   Patient denies blurred vision and double vision.  Cardiovascular:   Patient denies leg swelling and chest pains.  Respiratory:   Patient denies cough and shortness of breath.  Musculoskeletal:   Patient denies back pain and joint pain.  Neurological:   Patient denies headaches and dizziness.  Psychologic:   Patient denies depression and anxiety.   VITAL SIGNS:      12/29/2023 11:02 AM  BP 105/68 mmHg  Pulse 70 /min  Temperature 98.6 F / 37 C   MULTI-SYSTEM PHYSICAL EXAMINATION:    Constitutional: Well-nourished. No physical deformities. Normally developed. Good grooming.  Respiratory: Normal breath sounds. No labored  breathing, no use of accessory muscles.   Cardiovascular: Normal temperature, normal extremity pulses, no swelling, no varicosities.  Skin: No paleness, no jaundice, no cyanosis. No lesion, no ulcer, no rash.  Neurologic / Psychiatric: Oriented to time, oriented to place, oriented to person. No depression, no anxiety, no agitation.  Gastrointestinal: No mass, no tenderness, no rigidity, non obese abdomen.      Complexity of Data:  Source Of History:  Patient  Records Review:   Previous Doctor Records, Previous Patient Records  Urine Test Review:   Urinalysis   08/13/21 06/28/20 03/14/10 12/09/08 05/24/08 07/06/07 01/05/07 06/17/06  PSA  Total PSA < 0.04 ng/ml 0.04 ng/ml <0.01  <0.04  0.04  0.00  0.01  0.01     12/29/23  Urinalysis  Urine Appearance Cloudy   Urine Color Yellow   Urine Glucose Neg mg/dL  Urine Bilirubin Neg mg/dL  Urine Ketones Neg mg/dL  Urine Specific Gravity 1.025   Urine Blood 3+ ery/uL  Urine pH <=5.0   Urine Protein Trace mg/dL  Urine Urobilinogen 0.2 mg/dL  Urine Nitrites Neg   Urine Leukocyte Esterase 3+ leu/uL  Urine WBC/hpf >60/hpf   Urine RBC/hpf 10 - 20/hpf   Urine Epithelial Cells NS (Not Seen)   Urine Bacteria Few (10-25/hpf)   Urine Mucous Not Present   Urine Yeast NS (Not Seen)   Urine Trichomonas Not Present   Urine Cystals NS (Not Seen)   Urine Casts NS (Not Seen)   Urine Sperm Not Present    PROCEDURES:          Visit Complexity - G2211          Urinalysis w/Scope Dipstick Dipstick Cont'd Micro  Color: Yellow Bilirubin: Neg mg/dL WBC/hpf: >39/yeq  Appearance: Cloudy Ketones: Neg mg/dL RBC/hpf: 10 - 79/yeq  Specific Gravity: 1.025 Blood: 3+ ery/uL Bacteria: Few (10-25/hpf)  pH: <=5.0 Protein: Trace mg/dL Cystals: NS (Not Seen)  Glucose: Neg mg/dL Urobilinogen: 0.2 mg/dL Casts: NS (Not Seen)    Nitrites: Neg Trichomonas: Not Present    Leukocyte Esterase: 3+ leu/uL Mucous: Not Present      Epithelial Cells: NS (Not Seen)       Yeast: NS (Not Seen)      Sperm: Not Present  ASSESSMENT:      ICD-10 Details  1 GU:   Renal calculus - N20.0 Left, Chronic, Stable  2   Urinary Tract Inf, Unspec site - N39.0 Acute, Uncomplicated   PLAN:           Orders Labs CULTURE, URINE          Document Letter(s):  Created for Patient: Clinical Summary         Notes:   Urine was sent for culture. I discussed in detail what ureteroscopy entails including the possibility that he may need a second procedure due to the size and location of the stone. He verbalized his understanding and also knows he will have a stent for short time. He will keep his surgery schedules. He will hold his Eliquis  for 2 days. He will notify the office if he develops any changes to his history in the interval.        Next Appointment:      Next Appointment: 01/13/2024 07:30 AM    Appointment Type: Surgery     Location: [facility] [phone]    Provider: Norleen Seltzer, M.D.    Reason for Visit: OP--WL--CY, LEFT RTG, LEFT URS, HLL, STENT, FIRST STAGE

## 2024-01-13 ENCOUNTER — Encounter (HOSPITAL_COMMUNITY)
Admission: RE | Disposition: A | Discharge status: Home / Self Care | Payer: Self-pay | Source: Ambulatory Visit | Attending: Urology

## 2024-01-13 ENCOUNTER — Ambulatory Visit (HOSPITAL_BASED_OUTPATIENT_CLINIC_OR_DEPARTMENT_OTHER): Payer: Self-pay | Admitting: Anesthesiology

## 2024-01-13 ENCOUNTER — Ambulatory Visit (HOSPITAL_COMMUNITY)

## 2024-01-13 ENCOUNTER — Ambulatory Visit (HOSPITAL_COMMUNITY): Payer: Self-pay | Admitting: Physician Assistant

## 2024-01-13 ENCOUNTER — Encounter (HOSPITAL_COMMUNITY): Payer: Self-pay | Admitting: Urology

## 2024-01-13 ENCOUNTER — Ambulatory Visit (HOSPITAL_COMMUNITY)
Admission: RE | Admit: 2024-01-13 | Discharge: 2024-01-13 | Disposition: A | Discharge status: Home / Self Care | Source: Ambulatory Visit | Attending: Urology | Admitting: Urology

## 2024-01-13 DIAGNOSIS — K219 Gastro-esophageal reflux disease without esophagitis: Secondary | ICD-10-CM | POA: Insufficient documentation

## 2024-01-13 DIAGNOSIS — N200 Calculus of kidney: Secondary | ICD-10-CM

## 2024-01-13 DIAGNOSIS — Z950 Presence of cardiac pacemaker: Secondary | ICD-10-CM | POA: Diagnosis not present

## 2024-01-13 DIAGNOSIS — Z87440 Personal history of urinary (tract) infections: Secondary | ICD-10-CM | POA: Insufficient documentation

## 2024-01-13 DIAGNOSIS — Z01818 Encounter for other preprocedural examination: Secondary | ICD-10-CM

## 2024-01-13 DIAGNOSIS — I2510 Atherosclerotic heart disease of native coronary artery without angina pectoris: Secondary | ICD-10-CM | POA: Insufficient documentation

## 2024-01-13 DIAGNOSIS — I110 Hypertensive heart disease with heart failure: Secondary | ICD-10-CM

## 2024-01-13 DIAGNOSIS — Z7901 Long term (current) use of anticoagulants: Secondary | ICD-10-CM | POA: Diagnosis not present

## 2024-01-13 DIAGNOSIS — Z79899 Other long term (current) drug therapy: Secondary | ICD-10-CM | POA: Diagnosis not present

## 2024-01-13 DIAGNOSIS — I495 Sick sinus syndrome: Secondary | ICD-10-CM | POA: Diagnosis not present

## 2024-01-13 DIAGNOSIS — N393 Stress incontinence (female) (male): Secondary | ICD-10-CM | POA: Insufficient documentation

## 2024-01-13 DIAGNOSIS — Z8546 Personal history of malignant neoplasm of prostate: Secondary | ICD-10-CM | POA: Insufficient documentation

## 2024-01-13 DIAGNOSIS — I4891 Unspecified atrial fibrillation: Secondary | ICD-10-CM | POA: Diagnosis not present

## 2024-01-13 DIAGNOSIS — Z952 Presence of prosthetic heart valve: Secondary | ICD-10-CM | POA: Insufficient documentation

## 2024-01-13 DIAGNOSIS — I10 Essential (primary) hypertension: Secondary | ICD-10-CM | POA: Diagnosis not present

## 2024-01-13 DIAGNOSIS — I5021 Acute systolic (congestive) heart failure: Secondary | ICD-10-CM | POA: Diagnosis not present

## 2024-01-13 DIAGNOSIS — N201 Calculus of ureter: Secondary | ICD-10-CM | POA: Diagnosis not present

## 2024-01-13 DIAGNOSIS — Z8673 Personal history of transient ischemic attack (TIA), and cerebral infarction without residual deficits: Secondary | ICD-10-CM | POA: Insufficient documentation

## 2024-01-13 HISTORY — PX: CYSTOSCOPY/URETEROSCOPY/HOLMIUM LASER/STENT PLACEMENT: SHX6546

## 2024-01-13 HISTORY — DX: Atherosclerotic heart disease of native coronary artery without angina pectoris: I25.10

## 2024-01-13 SURGERY — CYSTOSCOPY/URETEROSCOPY/HOLMIUM LASER/STENT PLACEMENT
Anesthesia: General | Laterality: Left

## 2024-01-13 MED ORDER — PROPOFOL 10 MG/ML IV BOLUS
INTRAVENOUS | Status: DC | PRN
  Administered 2024-01-13: 140 mg via INTRAVENOUS

## 2024-01-13 MED ORDER — FENTANYL CITRATE PF 50 MCG/ML IJ SOSY: 25.0000 ug | PREFILLED_SYRINGE | INTRAMUSCULAR | Status: DC | PRN

## 2024-01-13 MED ORDER — ONDANSETRON HCL 4 MG/2ML IJ SOLN: 4.0000 mg | Freq: Once | INTRAMUSCULAR | Status: DC | PRN

## 2024-01-13 MED ORDER — DEXAMETHASONE SODIUM PHOSPHATE 10 MG/ML IJ SOLN
INTRAMUSCULAR | Status: DC | PRN
  Administered 2024-01-13: 10 mg via INTRAVENOUS

## 2024-01-13 MED ORDER — CHLORHEXIDINE GLUCONATE 0.12 % MT SOLN
15.0000 mL | Freq: Once | OROMUCOSAL | Status: AC
  Administered 2024-01-13: 15 mL via OROMUCOSAL

## 2024-01-13 MED ORDER — ONDANSETRON HCL 4 MG/2ML IJ SOLN
INTRAMUSCULAR | Status: DC | PRN
  Administered 2024-01-13: 4 mg via INTRAVENOUS

## 2024-01-13 MED ORDER — LIDOCAINE HCL (CARDIAC) PF 100 MG/5ML IV SOSY
PREFILLED_SYRINGE | INTRAVENOUS | Status: DC | PRN
Start: 2024-01-13 — End: 2024-01-13
  Administered 2024-01-13: 80 mg via INTRATRACHEAL

## 2024-01-13 MED ORDER — FENTANYL CITRATE (PF) 100 MCG/2ML IJ SOLN
INTRAMUSCULAR | Status: DC | PRN
  Administered 2024-01-13: 50 ug via INTRAVENOUS

## 2024-01-13 MED ORDER — PHENYLEPHRINE 80 MCG/ML (10ML) SYRINGE FOR IV PUSH (FOR BLOOD PRESSURE SUPPORT)
PREFILLED_SYRINGE | INTRAVENOUS | Status: DC | PRN
  Administered 2024-01-13: 80 ug via INTRAVENOUS
  Administered 2024-01-13 (×4): 160 ug via INTRAVENOUS

## 2024-01-13 MED ORDER — TAMSULOSIN HCL 0.4 MG PO CAPS: 0.4000 mg | ORAL_CAPSULE | Freq: Every day | ORAL | 1 refills | Status: DC

## 2024-01-13 MED ORDER — FENTANYL CITRATE (PF) 100 MCG/2ML IJ SOLN
INTRAMUSCULAR | Status: AC
  Filled 2024-01-13: qty 2

## 2024-01-13 MED ORDER — TRAMADOL HCL 50 MG PO TABS: 50.0000 mg | ORAL_TABLET | Freq: Four times a day (QID) | ORAL | 0 refills | Status: DC | PRN

## 2024-01-13 MED ORDER — ORAL CARE MOUTH RINSE: 15.0000 mL | Freq: Once | OROMUCOSAL | Status: AC

## 2024-01-13 MED ORDER — CEFAZOLIN SODIUM-DEXTROSE 2-4 GM/100ML-% IV SOLN
2.0000 g | INTRAVENOUS | Status: AC
  Administered 2024-01-13: 2 g via INTRAVENOUS
  Filled 2024-01-13: qty 100

## 2024-01-13 MED ORDER — LACTATED RINGERS IV SOLN: INTRAVENOUS | Status: DC

## 2024-01-13 MED ORDER — ACETAMINOPHEN 10 MG/ML IV SOLN: 1000.0000 mg | Freq: Once | INTRAVENOUS | Status: DC | PRN

## 2024-01-13 MED ORDER — SODIUM CHLORIDE 0.9% FLUSH: 3.0000 mL | Freq: Two times a day (BID) | INTRAVENOUS | Status: DC

## 2024-01-13 MED ORDER — SODIUM CHLORIDE 0.9 % IR SOLN
Status: DC | PRN
  Administered 2024-01-13: 3000 mL via INTRAVESICAL

## 2024-01-13 SURGICAL SUPPLY — 19 items
BAG URO CATCHER STRL LF (MISCELLANEOUS) ×2 IMPLANT
BASKET STONE NCOMPASS (UROLOGICAL SUPPLIES) IMPLANT
CATH URETERAL DUAL LUMEN 10F (MISCELLANEOUS) IMPLANT
CATH URETL OPEN 5X70 (CATHETERS) IMPLANT
CLOTH BEACON ORANGE TIMEOUT ST (SAFETY) ×2 IMPLANT
EXTRACTOR STONE NITINOL NGAGE (UROLOGICAL SUPPLIES) IMPLANT
GLOVE SURG SS PI 8.0 STRL IVOR (GLOVE) ×2 IMPLANT
GOWN STRL SURGICAL XL XLNG (GOWN DISPOSABLE) ×2 IMPLANT
GUIDEWIRE STR DUAL SENSOR (WIRE) ×2 IMPLANT
KIT TURNOVER KIT A (KITS) ×2 IMPLANT
LASER FIB FLEXIVA PULSE ID 365 (Laser) IMPLANT
MANIFOLD NEPTUNE II (INSTRUMENTS) ×2 IMPLANT
PACK CYSTO (CUSTOM PROCEDURE TRAY) ×2 IMPLANT
SHEATH NAV HD 11/13X46 (SHEATH) IMPLANT
SHEATH NAVIGATOR HD 11/13X36 (SHEATH) IMPLANT
STENT URET 6FRX26 CONTOUR (STENTS) IMPLANT
TRACTIP FLEXIVA PULS ID 200XHI (Laser) IMPLANT
TUBING CONNECTING 10 (TUBING) ×2 IMPLANT
TUBING UROLOGY SET (TUBING) ×2 IMPLANT

## 2024-01-13 NOTE — Transfer of Care (Signed)
 Immediate Anesthesia Transfer of Care Note  Patient: [name]  Procedure(s) Performed: CYSTOSCOPY/URETEROSCOPY/HOLMIUM LASER/STENT PLACEMENT (Left)  Patient Location: PACU  Anesthesia Type:General  Level of Consciousness: sedated, drowsy, and patient cooperative  Airway & Oxygen  Therapy: Patient Spontanous Breathing and Patient connected to face mask oxygen   Post-op Assessment: Report given to RN and Post -op Vital signs reviewed and stable  Post vital signs: stable  Last Vitals:  Vitals Value Taken Time  BP 165/97 01/13/24 08:45  Temp    Pulse 70 01/13/24 08:47  Resp 0 01/13/24 08:47  SpO2 100 % 01/13/24 08:47  Vitals shown include unfiled device data.  Last Pain:  Vitals:   01/13/24 0602  TempSrc: Oral  PainSc: 0-No pain         Complications: No notable events documented.

## 2024-01-13 NOTE — Discharge Instructions (Addendum)
 Please bring your stone fragments to the office at f/u.

## 2024-01-13 NOTE — Anesthesia Procedure Notes (Signed)
 Procedure Name: LMA Insertion Date/Time: 01/13/2024 7:37 AM  Performed by: Emilio Rock DEL, CRNAPre-anesthesia Checklist: Patient identified, Emergency Drugs available, Suction available, Patient being monitored and Timeout performed Patient Re-evaluated:Patient Re-evaluated prior to induction Oxygen  Delivery Method: Circle system utilized Preoxygenation: Pre-oxygenation with 100% oxygen  Induction Type: IV induction Ventilation: Mask ventilation without difficulty LMA: LMA inserted LMA Size: 5.0 Number of attempts: 2

## 2024-01-13 NOTE — Op Note (Signed)
 Procedure: 1.  Cystoscopy with left ureteroscopy with holmium laser application, stone extraction and placement of left double-J stent. 2.  Application of fluoroscopy.  Pre-op diagnosis: Left renal stone.  Postop diagnosis: Same.  Surgeon: Dr. Norleen Seltzer.  Anesthesia: General.  Specimen: Stone fragments.  Drains: 6 French by 26 cm left Contour double-J stent without tether.  EBL: None.  Complications: None.  Indications: The patient is an 78 year old male with an enlarging left upper pole partial staghorn measuring approximately 2.6 cm in greatest dimension who is elected ureteroscopy for management.  Procedure: He had been on cefdinir preoperatively and was given Ancef  in the operating room.  General anesthetic was induced.  He was placed in lithotomy position and fitted with PAS hose.  His perineum and genitalia were prepped with Betadine solution he was draped in usual sterile fashion.  Cystoscopy was performed using the 21 [place] scope and 30 degree lens.  Examination of the low normal urethra.  The external sphincter was intact.  The prostatic urethra was surgically absent.  The bladder wall had mild trabeculation without tumors, stones or inflammation.  There was a small submucosal nodule just medial to the right ureteral orifice.  Ureteral orifices were otherwise unremarkable.  A sensor wire was passed up the left ureteral orifice to the kidney under fluoroscopic guidance.  The stone could be faintly seen in the area of the upper pole.  There were surgical clips overlying this area.  The cystoscope was removed and the inner core of a 46 cm 11/13 [place] digital access sheath was easily passed to the kidney under fluoroscopic guidance.  The assembled sheath was then passed also without difficulty.  The inner core and wire were then removed.  The dual-lumen digital flexible scope was then advanced the kidney and the collecting system was inspected.  There was a stone in the upper  calyx as previously described there was branching into 2 calyces and then there was a smaller very friable stone in an upper to mid pole calyx that broke up just with the scope and flushed from the calyx.  I passed a 242 m holmium laser fiber and set the Moses laser on the dusting setting.  The stone fragmented readily with the left pedal on 0.3 J and 63 Hz.  A few small fragments were removed with the engage basket but the bulk of the fragments were much too small to remove with the basket and were felt to be sufficiently small to pass once the lasering had been completed.  Some of the stone material did flush out during the procedure.  Final inspection of all of the calyces demonstrated no significant residual stones of any size and the ureteroscope was then removed over the wire along with the sheath.  No significant ureteral or mucosal injuries were identified.  The cystoscope was reinserted over the wire and a 6 [place] by 26 cm contour double-J stent was advanced the kidney under fluoroscopic guidance.  The wires removed, leaving a good coil in the kidney and a good coil in the bladder.  The bladder was drained and the cystoscope was removed.  He was taken down from lithotomy position, his anesthetic was reversed and he was moved recovery in stable condition.  There were no complications.  The few stone fragments were given to the family to bring to the office at follow-up.

## 2024-01-13 NOTE — Anesthesia Postprocedure Evaluation (Signed)
 Anesthesia Post Note  Patient: [name] [name] Sun  Procedure(s) Performed: CYSTOSCOPY/URETEROSCOPY/HOLMIUM LASER/STENT PLACEMENT (Left)     Patient location during evaluation: PACU Anesthesia Type: General Level of consciousness: awake and alert Pain management: pain level controlled Vital Signs Assessment: post-procedure vital signs reviewed and stable Respiratory status: spontaneous breathing, nonlabored ventilation, respiratory function stable and patient connected to nasal cannula oxygen  Cardiovascular status: blood pressure returned to baseline and stable Postop Assessment: no apparent nausea or vomiting Anesthetic complications: no   No notable events documented.  Last Vitals:  Vitals:   01/13/24 0915 01/13/24 0930  BP: (!) 148/93 (!) 142/94  Pulse: 70 70  Resp: 12 16  Temp:  (!) 36.4 C  SpO2: 100% 100%    Last Pain:  Vitals:   01/13/24 0930  TempSrc:   PainSc: 0-No pain                 Garnette DELENA Gab

## 2024-01-13 NOTE — Interval H&P Note (Signed)
 History and Physical Interval Note:  No change  01/13/2024 7:16 AM  [name] [name]  has presented today for surgery, with the diagnosis of LEFT RENAL STONE.  The various methods of treatment have been discussed with the patient and family. After consideration of risks, benefits and other options for treatment, the patient has consented to  Procedure(s): CYSTOSCOPY/URETEROSCOPY/HOLMIUM LASER/STENT PLACEMENT (Left) as a surgical intervention.  The patient's history has been reviewed, patient examined, no change in status, stable for surgery.  I have reviewed the patient's chart and labs.  Questions were answered to the patient's satisfaction.     Kiernan Farkas

## 2024-01-14 ENCOUNTER — Encounter (HOSPITAL_COMMUNITY): Payer: Self-pay | Admitting: Urology

## 2024-01-15 NOTE — Progress Notes (Signed)
 Remote PPM Transmission

## 2024-01-19 ENCOUNTER — Telehealth: Payer: Self-pay | Admitting: Internal Medicine

## 2024-01-19 ENCOUNTER — Ambulatory Visit: Payer: Medicare Other

## 2024-01-19 DIAGNOSIS — I495 Sick sinus syndrome: Secondary | ICD-10-CM | POA: Diagnosis not present

## 2024-01-19 LAB — CUP PACEART REMOTE DEVICE CHECK
Battery Remaining Longevity: 67 mo
Battery Remaining Percentage: 95 %
Battery Voltage: 2.99 V
Brady Statistic RA Percent Paced: 77 %
Date Time Interrogation Session: 20251013020015
Implantable Lead Connection Status: 753985
Implantable Lead Implant Date: 20250225
Implantable Lead Location: 753859
Implantable Pulse Generator Implant Date: 20250225
Lead Channel Impedance Value: 380 Ohm
Lead Channel Pacing Threshold Amplitude: 0.75 V
Lead Channel Pacing Threshold Pulse Width: 0.4 ms
Lead Channel Sensing Intrinsic Amplitude: 2.9 mV
Lead Channel Setting Pacing Amplitude: 3.5 V
Pulse Gen Model: 2272
Pulse Gen Serial Number: 5998147

## 2024-01-19 MED ORDER — APIXABAN 5 MG PO TABS: 5.0000 mg | ORAL_TABLET | Freq: Two times a day (BID) | ORAL | 2 refills | Status: AC

## 2024-01-19 NOTE — Telephone Encounter (Signed)
*  STAT* If patient is at the pharmacy, call can be transferred to refill team.   1. Which medications need to be refilled? (please list name of each medication and dose if known)   apixaban  (ELIQUIS ) 5 MG TABS tablet    4. Which pharmacy/location (including street and city if local pharmacy) is medication to be sent to?  [id number] - , [place] - [address]     5. Do they need a 30 day or 90 day supply? 90   No longer a pt at [facility]

## 2024-01-19 NOTE — Telephone Encounter (Signed)
 Eliquis  5mg  refill request received. Patient is 78 years old, weight-86.3kg, Crea-1.08 on 12/30/23, Diagnosis-Afib/flutter, and last seen by Dr. [name] on 10/02/23. Dose is appropriate based on dosing criteria. Will send in refill to requested pharmacy.

## 2024-01-20 DIAGNOSIS — Z466 Encounter for fitting and adjustment of urinary device: Secondary | ICD-10-CM | POA: Diagnosis not present

## 2024-01-20 DIAGNOSIS — N200 Calculus of kidney: Secondary | ICD-10-CM | POA: Diagnosis not present

## 2024-01-21 NOTE — Progress Notes (Signed)
 Remote PPM Transmission

## 2024-01-22 ENCOUNTER — Ambulatory Visit: Payer: Self-pay | Admitting: Internal Medicine

## 2024-01-26 ENCOUNTER — Telehealth: Payer: Self-pay

## 2024-01-26 NOTE — Telephone Encounter (Signed)
 Left message about upcoming PREP Class at [facility] and asked him to return my call.

## 2024-02-04 ENCOUNTER — Ambulatory Visit: Payer: Self-pay | Admitting: Gastroenterology

## 2024-02-04 ENCOUNTER — Telehealth: Payer: Self-pay

## 2024-02-04 NOTE — Telephone Encounter (Signed)
 Left message about upcoming PREP Class at [facility] on November 10 and asked him to return my call.

## 2024-04-05 ENCOUNTER — Other Ambulatory Visit: Payer: Self-pay | Admitting: Internal Medicine

## 2024-04-05 ENCOUNTER — Other Ambulatory Visit: Payer: Self-pay | Admitting: Urology

## 2024-04-12 ENCOUNTER — Ambulatory Visit: Admitting: Gastroenterology

## 2024-04-12 ENCOUNTER — Encounter: Payer: Self-pay | Admitting: Gastroenterology

## 2024-04-12 ENCOUNTER — Telehealth: Payer: Self-pay

## 2024-04-12 VITALS — BP 130/70 | HR 78 | Ht 70.0 in | Wt 198.0 lb

## 2024-04-12 DIAGNOSIS — K224 Dyskinesia of esophagus: Secondary | ICD-10-CM | POA: Diagnosis not present

## 2024-04-12 DIAGNOSIS — Z860101 Personal history of adenomatous and serrated colon polyps: Secondary | ICD-10-CM

## 2024-04-12 DIAGNOSIS — Z9889 Other specified postprocedural states: Secondary | ICD-10-CM

## 2024-04-12 DIAGNOSIS — D509 Iron deficiency anemia, unspecified: Secondary | ICD-10-CM

## 2024-04-12 DIAGNOSIS — R1310 Dysphagia, unspecified: Secondary | ICD-10-CM | POA: Diagnosis not present

## 2024-04-12 DIAGNOSIS — D649 Anemia, unspecified: Secondary | ICD-10-CM

## 2024-04-12 MED ORDER — NA SULFATE-K SULFATE-MG SULF 17.5-3.13-1.6 GM/177ML PO SOLN: 1.0000 | Freq: Once | ORAL | 0 refills | Status: DC

## 2024-04-12 NOTE — Progress Notes (Signed)
 "  [name] [name] [id number] 10-26-1945   Chief Complaint: Discuss colonoscopy  Referring Provider: Duanne Butler DASEN, MD Primary GI MD: Dr. Shila  HPI: [name] [name] is a 79 y.o. male with past medical history of CAD, TIA 2011, A-fib, pacemaker in place, HLD, HTN, prostate cancer s/p prostatectomy, appendectomy who presents today for follow up.    Patient last seen in office 08/12/2023 by Ellouise Console, PA-C for hospital follow-up of lower GI bleed, ischemic colitis. He underwent mitral valve repair surgery 05/27/2023 by Dr. Maryjane.  He had postop complications including tachybradycardia syndrome requiring DCCV and permanent pacemaker placement on 2/25.  He was Hospitalized 05/27/2023 until 06/18/2023 for acute lower GI bleed thought secondary to ischemic colitis.  Also had a large tubular adenoma which was not able to be removed.  Dr. Simonne saw patient in hospital for GI consult.  He was given zosyn  for leukocytosis due to possible diverticulitis, he also developed an AKI that resolved with time.   Electrophysiology followed the patient closely for recurrent atrial fibrillation.    06/14/2023 colonoscopy by Dr. Leigh: External and internal hemorrhoids, mild diverticulosis in the left and right colon, no recent bleeding.  Multiple sessile polyps in the descending colon, transverse colon, and cecum.  Polyps were small less than 1 cm.  Not removed given his recent bleeding symptoms.  Evidence of ischemic colitis in the transverse colon and hepatic flexure.  Large bulky sessile villous polypoid lesion in the distal transverse colon.  Torturous colon.  Biopsies showed tubular adenoma with no high-grade dysplasia.  Colon biopsies showed granulation tissue with no dysplasia or malignancy.  Biopsies consistent with ischemic colitis.  At last visit anemia had improved, bleeding and resolved, patient was scheduled for repeat colonoscopy for polyp removal.  Cardiac clearance and permission to hold Eliquis   were requested prior to procedure.  He had some mild vague dysphagia and barium swallow with tablet was ordered, with consideration for EGD if abnormal.  Barium swallow showed no evidence of stricture or mass.  Had some mild esophageal dysmotility, minimal acid reflux, nothing worrisome and EGD not recommended.  Patient underwent colonoscopy 12/25/2023 with polypectomy, path showing tubular adenoma and recommended recall 6 months.  Labs 08/12/2023 showed improvement in anemia with hemoglobin 13.0, improvement in iron studies though iron saturation slightly low and was advised to take a general multivitamin with iron daily.  Vitamin D  improved, in low normal range at 31.67, advised to continue vitamin D  supplement and follow-up with PCP.  Vitamin B12 at low end of normal and advised to take OTC vitamin B12 1000 mcg daily.  Normal folate.  CBC 12/30/2023 with hemoglobin 12.2.    Discussed the use of AI scribe software for clinical note transcription with the patient, who gave verbal consent to proceed.  History of Present Illness [name] [name] is an 79 year old male with esophageal dysmotility, colonic tubular adenoma post-polypectomy, and iron deficiency anemia who presents for follow-up after recent colonoscopy.  Colonic polyps and lower gastrointestinal bleeding - No recurrence of rectal bleeding since last colonoscopy. - No symptoms of ischemic colitis or diverticulitis, including absence of abdominal pain, since hospitalization for GI bleeding in early 2025. - Two colonoscopies performed in the past year; most recent in September 2025 with complete removal of a large polyp diagnosed as tubular adenoma and another benign polyp. - No further bleeding since polypectomy.  Dysphagia and esophageal dysmotility - Mild, intermittent dysphagia persists, onset after mitral valve repair. - Difficulty swallowing most  notable with solid foods such as French fries or beef. - No progression of dysphagia  symptoms. - No odynophagia or regurgitation. - Food occasionally feels lodged at the bottom of the throat but is cleared with water . - Prior barium swallow demonstrated esophageal dysmotility. - No family history of esophageal or gastric cancer.  Iron deficiency anemia - Continues vitamin B12 and multivitamin with iron supplementation for anemia management. - Appetite remains normal. - No unintentional weight loss, abnormal bleeding, or bruising.  Cardiac history and symptoms - History of mitral valve repair and pacemaker placement. - No chest pain, shortness of breath, or recurrence of atrial fibrillation. - Blood pressure has remained normal.    Previous GI Procedures/Imaging   Colonoscopy 12/25/2023 - One 4 mm polyp in the ascending colon, removed with a cold snare. Resected and retrieved.  - Diverticulosis in the sigmoid colon and in the ascending colon.  - One large polyp in the transverse colon, removed with a hot snare. Resected and retrieved. Clips were placed.  - Non-bleeding external and internal hemorrhoids. - Recall 6 months Path: A. COLON TRANSVERSE POLYPECTOMY:  - Tubular adenoma  - Negative for high-grade dysplasia or malignancy  - Lateral mucosal margins are negative for dysplasia.   B. COLON ASCENDING POLYPECTOMY:  - Polypoid colonic mucosa with mild hyperplastic changes  - Negative for dysplasia   Barium swallow 08/26/2023 IMPRESSION: 1. No significant esophageal narrowing or stricture. 2. Mild esophageal dysmotility. 3. Minimal gastroesophageal reflux.  Past Medical History:  Diagnosis Date   Coronary artery disease    CVA (cerebral infarction) 07/14/2009   TIA   Dysrhythmia    A. Fib   Heart murmur    History of kidney stones    Hyperlipidemia    Hypertension    Nephrolithiasis    Paroxysmal atrial fibrillation (HCC)    Paroxysmal atrial flutter (HCC)    a. s/p ablation 2012.   Prostate cancer [facility])    Stroke [facility])    TIA - no deficits     Past Surgical History:  Procedure Laterality Date   APPENDECTOMY  1967   BONE BIOPSY  06/14/2023   Procedure: BIOPSY,Colon;  Surgeon: Leigh Elspeth SQUIBB, MD;  Location: [facility] ENDOSCOPY;  Service: Gastroenterology;;   CARDIOVERSION N/A 06/03/2023   Procedure: CARDIOVERSION;  Surgeon: Lonni Slain, MD;  Location: [facility] INVASIVE CV LAB;  Service: Cardiovascular;  Laterality: N/A;   CATARACT EXTRACTION W/ INTRAOCULAR LENS IMPLANT Bilateral    CERVICAL SPINE SURGERY     Cadaver Bone Placed   COLONOSCOPY     COLONOSCOPY N/A 06/14/2023   Procedure: COLONOSCOPY;  Surgeon: Leigh Elspeth SQUIBB, MD;  Location: [facility] ENDOSCOPY;  Service: Gastroenterology;  Laterality: N/A;   COLONOSCOPY N/A 12/25/2023   Procedure: COLONOSCOPY;  Surgeon: Shila Gustav GAILS, MD;  Location: [facility] ENDOSCOPY;  Service: Gastroenterology;  Laterality: N/A;   CYSTOSCOPY/URETEROSCOPY/HOLMIUM LASER/STENT PLACEMENT Left 09/19/2020   Procedure: CYSTOSCOPY RETROGRADE PYELOGRAM URETEROSCOPY/HOLMIUM LASER/STENT PLACEMENT;  Surgeon: Watt Rush, MD;  Location: [facility] ORS;  Service: Urology;  Laterality: Left;   CYSTOSCOPY/URETEROSCOPY/HOLMIUM LASER/STENT PLACEMENT Left 01/13/2024   Procedure: CYSTOSCOPY/URETEROSCOPY/HOLMIUM LASER/STENT PLACEMENT;  Surgeon: Watt Rush, MD;  Location: [facility] ORS;  Service: Urology;  Laterality: Left;   EYE SURGERY Bilateral    Eye Lid lifting   LUMBAR SPINE SURGERY     Ruptured Disc   MAZE N/A 05/27/2023   Procedure: MAZE;  Surgeon: Maryjane Mt, MD;  Location: [facility] OR;  Service: Open Heart Surgery;  Laterality: N/A;   MITRAL VALVE REPAIR N/A 05/27/2023  Procedure: MITRAL VALVE REPAIR USING MEDTRONIC SIMULUS SEMI-RIDGID ANNULOPLASTY BAND (MVR);  Surgeon: Maryjane Mt, MD;  Location: [facility] OR;  Service: Open Heart Surgery;  Laterality: N/A;   PACEMAKER IMPLANT N/A 06/03/2023   Procedure: PACEMAKER IMPLANT;  Surgeon: Waddell Danelle ORN, MD;  Location: [facility] INVASIVE CV LAB;  Service: Cardiovascular;  Laterality:  N/A;   PROSTATECTOMY  2005   RIGHT/LEFT HEART CATH AND CORONARY ANGIOGRAPHY N/A 02/27/2023   Procedure: RIGHT/LEFT HEART CATH AND CORONARY ANGIOGRAPHY;  Surgeon: [name] Lurena POUR, MD;  Location: [facility] INVASIVE CV LAB;  Service: Cardiovascular;  Laterality: N/A;   TEE WITHOUT CARDIOVERSION N/A 02/07/2023   Procedure: TRANSESOPHAGEAL ECHOCARDIOGRAM;  Surgeon: Francyne Headland, MD;  Location: [facility] INVASIVE CV LAB;  Service: Cardiovascular;  Laterality: N/A;   TEE WITHOUT CARDIOVERSION N/A 05/27/2023   Procedure: TRANSESOPHAGEAL ECHOCARDIOGRAM (TEE);  Surgeon: Maryjane Mt, MD;  Location: [facility] OR;  Service: Open Heart Surgery;  Laterality: N/A;   TRANSESOPHAGEAL ECHOCARDIOGRAM (CATH LAB) N/A 06/03/2023   Procedure: TRANSESOPHAGEAL ECHOCARDIOGRAM;  Surgeon: Lonni Slain, MD;  Location: [facility] INVASIVE CV LAB;  Service: Cardiovascular;  Laterality: N/A;   TRICUSPID VALVE REPLACEMENT N/A 05/27/2023   Procedure: TRICUSPID VALVE REPAIR USING EDWARDS MC3 TRICUSPID ANNULOPLASTY RING;  Surgeon: Maryjane Mt, MD;  Location: [facility] OR;  Service: Open Heart Surgery;  Laterality: N/A;    Current Outpatient Medications  Medication Sig Dispense Refill   acetaminophen  (TYLENOL ) 325 MG tablet Take 1-2 tablets (325-650 mg total) by mouth every 4 (four) hours as needed for mild pain (pain score 1-3).     apixaban  (ELIQUIS ) 5 MG TABS tablet Take 1 tablet (5 mg total) by mouth 2 (two) times daily. 180 tablet 2   atorvastatin  (LIPITOR) 40 MG tablet Take 1 tablet (40 mg total) by mouth daily. 90 tablet 3   metoprolol  succinate (TOPROL  XL) 25 MG 24 hr tablet Take 1 tablet (25 mg total) by mouth daily. 90 tablet 3   traMADol  (ULTRAM ) 50 MG tablet Take 1 tablet (50 mg total) by mouth every 6 (six) hours as needed for moderate pain (pain score 4-6). 20 tablet 0   No current facility-administered medications for this visit.    Allergies as of 04/12/2024   (No Known Allergies)    Family History  Problem Relation Age of  Onset   Heart disease Mother 73       died   Prostate cancer Father        alive   Testicular cancer Brother        died    Social History[1]   Review of Systems:    Constitutional: No weight loss, fever, chills Cardiovascular: No chest pain Respiratory: No SOB Gastrointestinal: See HPI and otherwise negative   Physical Exam:  Vital signs: BP 130/70   Pulse 78   Ht 5' 10 (1.778 m)   Wt 198 lb (89.8 kg)   BMI 28.41 kg/m   Wt Readings from Last 3 Encounters:  04/12/24 198 lb (89.8 kg)  01/13/24 190 lb 4 oz (86.3 kg)  12/30/23 190 lb 4 oz (86.3 kg)     Constitutional: Pleasant, well-appearing male in NAD, alert and cooperative Head:  Normocephalic and atraumatic.  Respiratory: Respirations even and unlabored. Lungs clear to auscultation bilaterally.  No wheezes, crackles, or rhonchi.  Cardiovascular:  Regular rate and rhythm. No murmurs. No peripheral edema. Gastrointestinal:  Soft, nondistended, nontender. No rebound or guarding. Normal bowel sounds. No appreciable masses or hepatomegaly. Rectal:  Not performed.  Neurologic:  Alert  and oriented x4;  grossly normal neurologically.  Skin:   Dry and intact without significant lesions or rashes. Psychiatric: Oriented to person, place and time. Demonstrates good judgement and reason without abnormal affect or behaviors.   Echocardiogram 07/24/2023 1. Left ventricular ejection fraction, by estimation, is 55 to 60% . Left ventricular ejection fraction by 3D volume is 57 %. The left ventricle has normal function. The left ventricle has no regional wall motion abnormalities. There is mild concentric left ventricular hypertrophy. Left ventricular diastolic parameters are consistent with Grade III diastolic dysfunction (restrictive).  2. Right ventricular systolic function is normal. The right ventricular size is mildly enlarged. Tricuspid regurgitation signal is inadequate for assessing PA pressure.  3. Left atrial size was  moderately dilated.  4. Right atrial size was moderately dilated.  5. The mitral valve is degenerative. Mild mitral valve regurgitation. No evidence of mitral stenosis. The mean mitral valve gradient is 2. 0 mmHg. Moderate mitral annular calcification. There is a prosthetic annuloplasty ring present in the mitral position.  6. The tricuspid valve is has been repaired/ replaced. The tricuspid valve is status post repair with an annuloplasty ring.  7. The aortic valve is tricuspid. Aortic valve regurgitation is trivial. Aortic valve sclerosis/ calcification is present, without any evidence of aortic stenosis. Aortic valve area, by VTI measures 2. 30 cm . Aortic valve mean gradient measures 5.0 mmHg.   Assessment/Plan:   Assessment & Plan Colonic tubular adenoma, post-polypectomy Status post excision of a large adenomatous colonic polyp, without recurrence of gastrointestinal bleeding or abdominal pain. Ongoing surveillance is recommended to confirm complete resection and absence of residual neoplasia.  Initially plan was to schedule patient for colonoscopy in March, however after further consideration he has some concerns regarding procedure risks at his age and based on his past medical history.  He would like to take some more time to consider and agrees to follow-up with Dr. Shila to discuss further.  - Follow up with Dr. Shila in March to discuss repeat colonoscopy  Esophageal dysmotility with dysphagia Chronic intermittent dysphagia with solid foods, consistent with esophageal dysmotility. Symptoms are non-progressive.  No concerning findings on previous barium swallow.  Initially had planned for upper endoscopy to be done with his repeat colonoscopy, however after further consideration he has some concerns regarding procedure risks at his age and based on his past medical history.  He would like to take some more time to consider and agrees to follow-up with Dr. Shila to discuss  further.  - Follow up with Dr. Shila in March to discuss EGD  Iron deficiency anemia Iron deficiency anemia secondary to prior gastrointestinal bleeding, currently well-managed with no evidence of ongoing blood loss or symptomatic anemia.  Initially planned to have patient repeat labs today, however towards end of visit he changed his mind about scheduling procedures and no longer had time to do labs due to having another appointment, opted to defer until he returns for follow-up visit.  - Plan for repeat labs at follow-up to monitor iron deficiency anemia.   Camie Furbish, PA-C [facility] 04/12/2024, 3:09 PM  Patient Care Team: Duanne Butler DASEN, MD as PCP - General (Family Medicine) Waddell Danelle ORN, MD as PCP - Electrophysiology (Cardiology) [name] Lurena POUR, MD as PCP - Cardiology (Cardiology) Duanne Butler DASEN, MD (Family Medicine)       [1]  Social History Tobacco Use   Smoking status: Never   Smokeless tobacco: Never  Vaping Use   Vaping status: Never  Used  Substance Use Topics   Alcohol use: No   Drug use: No   "

## 2024-04-12 NOTE — Telephone Encounter (Signed)
"  error  "

## 2024-04-12 NOTE — Patient Instructions (Addendum)
 Follow up with Dr Nandigam

## 2024-04-15 ENCOUNTER — Telehealth: Payer: Self-pay | Admitting: Family Medicine

## 2024-04-15 NOTE — Telephone Encounter (Signed)
 Prescription Request  04/15/2024  LOV: Visit date not found  What is the name of the medication or equipment?   traMADol  (ULTRAM ) 50 MG tablet   Have you contacted your pharmacy to request a refill? Yes   Which pharmacy would you like this sent to?  [facility] [address] [place] - [address]  [place] [address] Phone: [phone] Fax: [phone]    Patient notified that their request is being sent to the clinical staff for review and that they should receive a response within 2 business days.   Please advise pharmacist.

## 2024-04-16 ENCOUNTER — Other Ambulatory Visit: Payer: Self-pay | Admitting: Family Medicine

## 2024-04-16 MED ORDER — TRAMADOL HCL 50 MG PO TABS: 50.0000 mg | ORAL_TABLET | Freq: Four times a day (QID) | ORAL | 0 refills | Status: AC | PRN

## 2024-04-19 ENCOUNTER — Ambulatory Visit: Payer: Medicare Other

## 2024-04-19 DIAGNOSIS — I495 Sick sinus syndrome: Secondary | ICD-10-CM

## 2024-04-20 LAB — CUP PACEART REMOTE DEVICE CHECK
Battery Remaining Longevity: 66 mo
Battery Remaining Percentage: 92 %
Battery Voltage: 2.99 V
Brady Statistic RA Percent Paced: 80 %
Date Time Interrogation Session: 20260112020013
Implantable Lead Connection Status: 753985
Implantable Lead Implant Date: 20250225
Implantable Lead Location: 753859
Implantable Pulse Generator Implant Date: 20250225
Lead Channel Impedance Value: 400 Ohm
Lead Channel Pacing Threshold Amplitude: 0.75 V
Lead Channel Pacing Threshold Pulse Width: 0.4 ms
Lead Channel Sensing Intrinsic Amplitude: 2.4 mV
Lead Channel Setting Pacing Amplitude: 3.5 V
Pulse Gen Model: 2272
Pulse Gen Serial Number: 5998147

## 2024-04-21 ENCOUNTER — Ambulatory Visit: Payer: Self-pay | Admitting: Cardiology

## 2024-04-23 NOTE — Progress Notes (Signed)
 Remote PPM Transmission

## 2024-06-10 ENCOUNTER — Ambulatory Visit: Admitting: Gastroenterology

## 2024-06-30 ENCOUNTER — Encounter: Admitting: Gastroenterology
